# Patient Record
Sex: Female | Born: 1960
Health system: Southern US, Community
[De-identification: ages and names within clinical notes are randomized; demographics above are authoritative.]

## PROBLEM LIST (undated history)

## (undated) DIAGNOSIS — T4145XA Adverse effect of unspecified anesthetic, initial encounter: Secondary | ICD-10-CM

## (undated) DIAGNOSIS — K219 Gastro-esophageal reflux disease without esophagitis: Secondary | ICD-10-CM

## (undated) DIAGNOSIS — K52832 Lymphocytic colitis: Secondary | ICD-10-CM

## (undated) DIAGNOSIS — M1990 Unspecified osteoarthritis, unspecified site: Secondary | ICD-10-CM

## (undated) DIAGNOSIS — K2970 Gastritis, unspecified, without bleeding: Secondary | ICD-10-CM

## (undated) DIAGNOSIS — M549 Dorsalgia, unspecified: Secondary | ICD-10-CM

## (undated) DIAGNOSIS — G43909 Migraine, unspecified, not intractable, without status migrainosus: Secondary | ICD-10-CM

## (undated) DIAGNOSIS — R609 Edema, unspecified: Secondary | ICD-10-CM

## (undated) DIAGNOSIS — I10 Essential (primary) hypertension: Secondary | ICD-10-CM

## (undated) DIAGNOSIS — M2550 Pain in unspecified joint: Secondary | ICD-10-CM

## (undated) DIAGNOSIS — F329 Major depressive disorder, single episode, unspecified: Secondary | ICD-10-CM

## (undated) DIAGNOSIS — F419 Anxiety disorder, unspecified: Secondary | ICD-10-CM

## (undated) DIAGNOSIS — M5430 Sciatica, unspecified side: Secondary | ICD-10-CM

## (undated) DIAGNOSIS — T8859XA Other complications of anesthesia, initial encounter: Secondary | ICD-10-CM

## (undated) DIAGNOSIS — F32A Depression, unspecified: Secondary | ICD-10-CM

## (undated) HISTORY — DX: Essential (primary) hypertension: I10

## (undated) HISTORY — DX: Migraine, unspecified, not intractable, without status migrainosus: G43.909

## (undated) HISTORY — PX: TOTAL ABDOMINAL HYSTERECTOMY: SHX209

## (undated) HISTORY — DX: Lymphocytic colitis: K52.832

## (undated) HISTORY — DX: Dorsalgia, unspecified: M54.9

## (undated) HISTORY — DX: Sciatica, unspecified side: M54.30

## (undated) HISTORY — DX: Gastritis, unspecified, without bleeding: K29.70

## (undated) HISTORY — DX: Depression, unspecified: F32.A

## (undated) HISTORY — DX: Major depressive disorder, single episode, unspecified: F32.9

## (undated) HISTORY — DX: Gastro-esophageal reflux disease without esophagitis: K21.9

## (undated) HISTORY — DX: Unspecified osteoarthritis, unspecified site: M19.90

## (undated) HISTORY — DX: Edema, unspecified: R60.9

## (undated) HISTORY — DX: Pain in unspecified joint: M25.50

## (undated) HISTORY — PX: SKIN CANCER EXCISION: SHX779

## (undated) HISTORY — PX: BACK SURGERY: SHX140

## (undated) HISTORY — DX: Anxiety disorder, unspecified: F41.9

---

## 1999-07-17 ENCOUNTER — Other Ambulatory Visit: Admission: RE | Admit: 1999-07-17 | Discharge: 1999-07-17 | Payer: Self-pay | Admitting: *Deleted

## 1999-11-07 ENCOUNTER — Encounter (INDEPENDENT_AMBULATORY_CARE_PROVIDER_SITE_OTHER): Payer: Self-pay | Admitting: *Deleted

## 1999-11-07 ENCOUNTER — Ambulatory Visit (HOSPITAL_BASED_OUTPATIENT_CLINIC_OR_DEPARTMENT_OTHER): Admission: RE | Admit: 1999-11-07 | Discharge: 1999-11-07 | Payer: Self-pay | Admitting: Plastic Surgery

## 2000-07-20 ENCOUNTER — Encounter: Admission: RE | Admit: 2000-07-20 | Discharge: 2000-07-28 | Payer: Self-pay | Admitting: Family Medicine

## 2001-02-15 ENCOUNTER — Ambulatory Visit (HOSPITAL_BASED_OUTPATIENT_CLINIC_OR_DEPARTMENT_OTHER): Admission: RE | Admit: 2001-02-15 | Discharge: 2001-02-15 | Payer: Self-pay | Admitting: Orthopaedic Surgery

## 2002-01-27 ENCOUNTER — Other Ambulatory Visit: Admission: RE | Admit: 2002-01-27 | Discharge: 2002-01-27 | Payer: Self-pay | Admitting: *Deleted

## 2002-06-19 ENCOUNTER — Encounter: Payer: Self-pay | Admitting: Neurosurgery

## 2002-06-19 ENCOUNTER — Inpatient Hospital Stay (HOSPITAL_COMMUNITY): Admission: RE | Admit: 2002-06-19 | Discharge: 2002-06-20 | Payer: Self-pay | Admitting: Neurosurgery

## 2003-03-20 ENCOUNTER — Other Ambulatory Visit: Admission: RE | Admit: 2003-03-20 | Discharge: 2003-03-20 | Payer: Self-pay | Admitting: *Deleted

## 2003-07-14 HISTORY — PX: OTHER SURGICAL HISTORY: SHX169

## 2004-07-13 HISTORY — PX: KNEE SURGERY: SHX244

## 2005-03-17 ENCOUNTER — Ambulatory Visit: Payer: Self-pay | Admitting: Family Medicine

## 2005-04-16 ENCOUNTER — Ambulatory Visit: Payer: Self-pay | Admitting: Family Medicine

## 2005-07-13 HISTORY — PX: ABDOMINAL HYSTERECTOMY: SHX81

## 2005-07-17 ENCOUNTER — Observation Stay (HOSPITAL_COMMUNITY): Admission: RE | Admit: 2005-07-17 | Discharge: 2005-07-18 | Payer: Self-pay | Admitting: *Deleted

## 2005-07-17 ENCOUNTER — Encounter (INDEPENDENT_AMBULATORY_CARE_PROVIDER_SITE_OTHER): Payer: Self-pay | Admitting: Specialist

## 2005-08-24 ENCOUNTER — Ambulatory Visit: Payer: Self-pay | Admitting: Family Medicine

## 2005-09-01 ENCOUNTER — Ambulatory Visit: Payer: Self-pay | Admitting: Family Medicine

## 2005-09-07 ENCOUNTER — Ambulatory Visit: Payer: Self-pay | Admitting: Family Medicine

## 2005-09-11 ENCOUNTER — Ambulatory Visit: Payer: Self-pay | Admitting: Family Medicine

## 2005-09-23 ENCOUNTER — Ambulatory Visit: Payer: Self-pay | Admitting: Internal Medicine

## 2005-10-19 ENCOUNTER — Ambulatory Visit: Payer: Self-pay | Admitting: Internal Medicine

## 2005-10-21 ENCOUNTER — Encounter (INDEPENDENT_AMBULATORY_CARE_PROVIDER_SITE_OTHER): Payer: Self-pay | Admitting: *Deleted

## 2005-10-21 ENCOUNTER — Ambulatory Visit: Payer: Self-pay | Admitting: Internal Medicine

## 2006-05-19 ENCOUNTER — Encounter: Payer: Self-pay | Admitting: Family Medicine

## 2006-05-19 ENCOUNTER — Ambulatory Visit: Payer: Self-pay | Admitting: Family Medicine

## 2006-05-19 DIAGNOSIS — H9190 Unspecified hearing loss, unspecified ear: Secondary | ICD-10-CM | POA: Insufficient documentation

## 2006-05-19 DIAGNOSIS — M5430 Sciatica, unspecified side: Secondary | ICD-10-CM | POA: Insufficient documentation

## 2006-11-23 ENCOUNTER — Ambulatory Visit: Payer: Self-pay | Admitting: Family Medicine

## 2006-11-23 DIAGNOSIS — J039 Acute tonsillitis, unspecified: Secondary | ICD-10-CM | POA: Insufficient documentation

## 2006-11-23 DIAGNOSIS — I10 Essential (primary) hypertension: Secondary | ICD-10-CM | POA: Insufficient documentation

## 2006-12-21 ENCOUNTER — Ambulatory Visit: Payer: Self-pay | Admitting: Family Medicine

## 2006-12-24 ENCOUNTER — Telehealth: Payer: Self-pay | Admitting: Family Medicine

## 2006-12-31 ENCOUNTER — Ambulatory Visit: Payer: Self-pay | Admitting: Family Medicine

## 2007-02-01 ENCOUNTER — Ambulatory Visit: Payer: Self-pay | Admitting: Family Medicine

## 2007-05-20 ENCOUNTER — Ambulatory Visit: Payer: Self-pay | Admitting: Family Medicine

## 2007-05-20 DIAGNOSIS — J309 Allergic rhinitis, unspecified: Secondary | ICD-10-CM | POA: Insufficient documentation

## 2007-05-27 ENCOUNTER — Telehealth: Payer: Self-pay | Admitting: Family Medicine

## 2007-07-21 ENCOUNTER — Telehealth: Payer: Self-pay | Admitting: Family Medicine

## 2007-11-10 ENCOUNTER — Ambulatory Visit: Payer: Self-pay | Admitting: Family Medicine

## 2007-11-11 LAB — CONVERTED CEMR LAB
AST: 20 units/L (ref 0–37)
Albumin: 4.5 g/dL (ref 3.5–5.2)
Alkaline Phosphatase: 37 units/L — ABNORMAL LOW (ref 39–117)
BUN: 13 mg/dL (ref 6–23)
Creatinine, Ser: 0.64 mg/dL (ref 0.40–1.20)
Glucose, Bld: 87 mg/dL (ref 70–99)
Potassium: 4.1 meq/L (ref 3.5–5.3)
Total Bilirubin: 0.5 mg/dL (ref 0.3–1.2)

## 2007-12-14 ENCOUNTER — Ambulatory Visit (HOSPITAL_COMMUNITY): Admission: RE | Admit: 2007-12-14 | Discharge: 2007-12-14 | Payer: Self-pay | Admitting: Neurosurgery

## 2008-01-06 DIAGNOSIS — Z8719 Personal history of other diseases of the digestive system: Secondary | ICD-10-CM | POA: Insufficient documentation

## 2008-01-06 DIAGNOSIS — Z85828 Personal history of other malignant neoplasm of skin: Secondary | ICD-10-CM | POA: Insufficient documentation

## 2008-01-09 ENCOUNTER — Ambulatory Visit: Payer: Self-pay | Admitting: Internal Medicine

## 2008-01-16 ENCOUNTER — Encounter: Payer: Self-pay | Admitting: Family Medicine

## 2008-04-18 ENCOUNTER — Encounter: Payer: Self-pay | Admitting: Internal Medicine

## 2008-05-26 ENCOUNTER — Telehealth: Payer: Self-pay | Admitting: Family Medicine

## 2008-05-28 ENCOUNTER — Telehealth: Payer: Self-pay | Admitting: Family Medicine

## 2008-05-31 ENCOUNTER — Ambulatory Visit: Payer: Self-pay | Admitting: Family Medicine

## 2008-05-31 DIAGNOSIS — G43909 Migraine, unspecified, not intractable, without status migrainosus: Secondary | ICD-10-CM | POA: Insufficient documentation

## 2008-05-31 DIAGNOSIS — G43109 Migraine with aura, not intractable, without status migrainosus: Secondary | ICD-10-CM

## 2008-06-06 ENCOUNTER — Encounter: Payer: Self-pay | Admitting: Family Medicine

## 2008-06-11 ENCOUNTER — Telehealth: Payer: Self-pay | Admitting: Family Medicine

## 2008-07-04 ENCOUNTER — Ambulatory Visit (HOSPITAL_COMMUNITY): Admission: RE | Admit: 2008-07-04 | Discharge: 2008-07-04 | Payer: Self-pay | Admitting: Orthopedic Surgery

## 2008-09-06 ENCOUNTER — Telehealth: Payer: Self-pay | Admitting: Family Medicine

## 2008-10-16 ENCOUNTER — Encounter: Payer: Self-pay | Admitting: Internal Medicine

## 2008-10-29 ENCOUNTER — Telehealth (INDEPENDENT_AMBULATORY_CARE_PROVIDER_SITE_OTHER): Payer: Self-pay | Admitting: *Deleted

## 2008-11-05 ENCOUNTER — Ambulatory Visit: Payer: Self-pay | Admitting: Family Medicine

## 2008-11-05 DIAGNOSIS — M25569 Pain in unspecified knee: Secondary | ICD-10-CM | POA: Insufficient documentation

## 2008-12-05 ENCOUNTER — Ambulatory Visit: Payer: Self-pay | Admitting: Internal Medicine

## 2008-12-06 ENCOUNTER — Ambulatory Visit (HOSPITAL_COMMUNITY): Admission: RE | Admit: 2008-12-06 | Discharge: 2008-12-06 | Payer: Self-pay | Admitting: Orthopedic Surgery

## 2009-04-24 ENCOUNTER — Encounter: Payer: Self-pay | Admitting: Family Medicine

## 2009-06-11 ENCOUNTER — Encounter: Payer: Self-pay | Admitting: Family Medicine

## 2009-06-13 ENCOUNTER — Encounter: Payer: Self-pay | Admitting: Family Medicine

## 2009-06-25 ENCOUNTER — Ambulatory Visit (HOSPITAL_COMMUNITY): Admission: RE | Admit: 2009-06-25 | Discharge: 2009-06-25 | Payer: Self-pay | Admitting: Orthopedic Surgery

## 2009-07-22 HISTORY — PX: SHOULDER SURGERY: SHX246

## 2009-08-22 ENCOUNTER — Ambulatory Visit (HOSPITAL_BASED_OUTPATIENT_CLINIC_OR_DEPARTMENT_OTHER): Admission: RE | Admit: 2009-08-22 | Discharge: 2009-08-22 | Payer: Self-pay | Admitting: Orthopedic Surgery

## 2009-08-28 ENCOUNTER — Telehealth: Payer: Self-pay | Admitting: Family Medicine

## 2009-09-17 ENCOUNTER — Telehealth: Payer: Self-pay | Admitting: Family Medicine

## 2009-10-10 ENCOUNTER — Telehealth: Payer: Self-pay | Admitting: Family Medicine

## 2009-10-22 ENCOUNTER — Ambulatory Visit: Payer: Self-pay | Admitting: Family Medicine

## 2009-10-22 LAB — CONVERTED CEMR LAB
BUN: 17 mg/dL (ref 6–23)
CO2: 25 meq/L (ref 19–32)
Chloride: 103 meq/L (ref 96–112)
Creatinine, Ser: 0.76 mg/dL (ref 0.40–1.20)
Glucose, Bld: 96 mg/dL (ref 70–99)
Potassium: 4.2 meq/L (ref 3.5–5.3)

## 2009-12-12 ENCOUNTER — Ambulatory Visit: Payer: Self-pay | Admitting: Sports Medicine

## 2009-12-12 DIAGNOSIS — M1710 Unilateral primary osteoarthritis, unspecified knee: Secondary | ICD-10-CM

## 2009-12-12 DIAGNOSIS — IMO0002 Reserved for concepts with insufficient information to code with codable children: Secondary | ICD-10-CM | POA: Insufficient documentation

## 2010-02-03 ENCOUNTER — Ambulatory Visit: Payer: Self-pay | Admitting: Emergency Medicine

## 2010-02-03 DIAGNOSIS — J069 Acute upper respiratory infection, unspecified: Secondary | ICD-10-CM | POA: Insufficient documentation

## 2010-05-15 LAB — HEPATIC FUNCTION PANEL
ALT: 14 U/L (ref 7–35)
AST: 15 U/L (ref 13–35)

## 2010-05-15 LAB — TSH: TSH: 1.7 u[IU]/mL (ref 0.41–5.90)

## 2010-05-15 LAB — LIPID PANEL: Triglycerides: 99 mg/dL (ref 40–160)

## 2010-06-12 ENCOUNTER — Encounter: Payer: Self-pay | Admitting: Family Medicine

## 2010-06-17 ENCOUNTER — Encounter: Payer: Self-pay | Admitting: Family Medicine

## 2010-06-17 LAB — HM MAMMOGRAPHY: HM Mammogram: NORMAL

## 2010-07-30 ENCOUNTER — Encounter: Payer: Self-pay | Admitting: Family Medicine

## 2010-08-11 ENCOUNTER — Encounter: Payer: Self-pay | Admitting: Family Medicine

## 2010-08-12 NOTE — Progress Notes (Signed)
Summary: shoulder surgery  ---- Converted from flag ---- ---- 08/23/2009 11:28 AM, Payton Spark CMA wrote:   ---- 08/23/2009 11:27 AM, Payton Spark CMA wrote: Pt is doing well. Sees surgeon today for FU and will start PT soon.   ---- 08/23/2009 9:15 AM, Payton Spark CMA wrote:   ---- 08/23/2009 8:12 AM, Nani Gasser MD wrote: Call pt: See how she is doing post shoulder surgery ------------------------------

## 2010-08-12 NOTE — Assessment & Plan Note (Signed)
Summary: HTN, knee pain   Vital Signs:  Patient profile:   50 year old female Height:      68 inches Weight:      226 pounds BMI:     34.49 Pulse rate:   112 / minute BP sitting:   130 / 88  (left arm) Cuff size:   large  Vitals Entered By: Kathlene November (October 22, 2009 8:36 AM) CC: followup BP, Hypertension Management   Primary Care Provider:  Nani Gasser,  M.D.  CC:  followup BP and Hypertension Management.  History of Present Illness: Was having mild HA and "swimmy headedness".  Was checking BPs and they were running in the 130s.  STilll on the meloxicam.  Still having alot of left knee pain s/p surgery on the left.  Also having some GERD.  sxs.  Feels doesn't do well without the meloxicam.   Hypertension History:      She denies headache, chest pain, palpitations, dyspnea with exertion, orthopnea, PND, peripheral edema, visual symptoms, neurologic problems, syncope, and side effects from treatment.  She notes no problems with any antihypertensive medication side effects.  Tolerating the lisinoorpil well. No SE or dry cough.  Stopped the metoprolol.  [name]        Positive major cardiovascular risk factors include hypertension.  Negative major cardiovascular risk factors include female age less than 1 years old, negative family history for ischemic heart disease, and non-tobacco-user status.     Current Medications (verified): 1)  Neurontin 600 Mg Tabs (Gabapentin) .... Take 1 Tablet By Mouth Two Times A Day 2)  Lisinopril-Hydrochlorothiazide 20-25 Mg Tabs (Lisinopril-Hydrochlorothiazide) .... Take 1 Tablet By Mouth Once A Day in The Am 3)  Imitrex 20 Mg/act  Soln (Sumatriptan) .[name].. 1 Spray in One Nostril. Can Repeat in 2 Hours If Still Have Ha 4)  Astelin 137 Mcg/spray  Soln (Azelastine Hcl) .[name].. 1-2 Sprays Each Nostril Two Times A Day 5)  Multivitamins   Caps (Multiple Vitamin) .[name].. 1 Tablet Once Daily 6)  Calcium 500/vitamin D 500-125 Mg-Unit  Tabs (Calcium Carbonate-Vitamin  D) .[name].. 1 Tablet Two Times A Day 7)  Glucosamine Chondr 1500 Complx   Caps (Glucosamine-Chondroit-Vit C-Mn) .[name].. 1 Tablet Once Daily 8)  Fish Oil Concentrate 1000 Mg  Caps (Omega-3 Fatty Acids) .[name].. 1 Tablet Two Times A Day 9)  Meloxicam 7.5 Mg Tabs (Meloxicam) .[name].. 1-2 Tab By Mouth Once Daily With Food As Needed For Pain 10)  Melatonin 3 Mg Tabs (Melatonin) .... At Bedtime 11)  Levsin/sl 0.125 Mg Subl (Hyoscyamine Sulfate) .... Dissolve 1 Tablet Under The Tongue As Needed For Crampy Abdominal Pain  Allergies (verified): 1)  ! * Anectine  Comments:  Nurse/Medical Assistant: The patient's medications and allergies were reviewed with the patient and were updated in the Medication and Allergy Lists. Kathlene November (October 22, 2009 8:37 AM)  Physical Exam  General:  Well-developed,well-nourished,in no acute distress; alert,appropriate and cooperative throughout examination Head:  Normocephalic and atraumatic without obvious abnormalities. No apparent alopecia or balding. Lungs:  Normal respiratory effort, chest expands symmetrically. Lungs are clear to auscultation, no crackles or wheezes. Heart:  Normal rate and regular rhythm. S1 and S2 normal without gallop, murmur, click, rub or other extra sounds. No carotid bruits.  Skin:  no rashes.   Cervical Nodes:  No lymphadenopathy noted Psych:  Cognition and judgment appear intact. Alert and cooperative with normal attention span and concentration. No apparent delusions, illusions, hallucinations   Impression & Recommendations:  Problem #  1:  HYPERTENSION, BENIGN ESSENTIAL (ICD-401.1) Encouraged her to work on diet and exercise. Given infor on the DASH diet. She is also thinking about starting weight Watchers again.SHe has done well with this in the past.  F/U in 3 months. Due for BMP since starting the ACEi.   The following medications were removed from the medication list:    Metoprolol Succinate 25 Mg Xr24h-tab (Metoprolol succinate) .[name]... 1  tab by mouth daily Her updated medication list for this problem includes:    Lisinopril-hydrochlorothiazide 20-25 Mg Tabs (Lisinopril-hydrochlorothiazide) .[name]... Take 1 tablet by mouth once a day in the am  Orders: T-Basic Metabolic Panel [phone])  Problem # 2:  KNEE PAIN, LEFT (ICD-719.46)  I am concerned about her GERD being on a daily NSAID. Discussed starting a PPI OTC for about 8 weeks. If not helping then please let me know.Also dicussed seeing Dr. Darrick Penna to help her start to exercise with her knee pain and hopefully reduce her need for NSAIDs.   The following medications were removed from the medication list:    Tramadol Hcl 50 Mg Tabs (Tramadol hcl) .[name]... Take 1 tablet by mouth three ti. mes a day as needed for knee pain    Cyclobenzaprine Hcl 10 Mg Tabs (Cyclobenzaprine hcl) ..... One by mouth at bedtime as needed muscle spasm. Her updated medication list for this problem includes:    Meloxicam 7.5 Mg Tabs (Meloxicam) .[name]... 1-2 tab by mouth once daily with food as needed for pain  Complete Medication List: 1)  Neurontin 600 Mg Tabs (Gabapentin) .... Take 1 tablet by mouth two times a day 2)  Lisinopril-hydrochlorothiazide 20-25 Mg Tabs (Lisinopril-hydrochlorothiazide) .... Take 1 tablet by mouth once a day in the am 3)  Imitrex 20 Mg/act Soln (Sumatriptan) .[name].. 1 spray in one nostril. can repeat in 2 hours if still have ha 4)  Astelin 137 Mcg/spray Soln (Azelastine hcl) .[name].. 1-2 sprays each nostril two times a day 5)  Multivitamins Caps (Multiple vitamin) .[name].. 1 tablet once daily 6)  Calcium 500/vitamin D 500-125 Mg-unit Tabs (Calcium carbonate-vitamin d) .[name].. 1 tablet two times a day 7)  Glucosamine Chondr 1500 Complx Caps (Glucosamine-chondroit-vit c-mn) .[name].. 1 tablet once daily 8)  Fish Oil Concentrate 1000 Mg Caps (Omega-3 fatty acids) .[name].. 1 tablet two times a day 9)  Meloxicam 7.5 Mg Tabs (Meloxicam) .[name].. 1-2 tab by mouth once daily with food as needed for pain 10)   Melatonin 3 Mg Tabs (Melatonin) .... At bedtime 11)  Levsin/sl 0.125 Mg Subl (Hyoscyamine sulfate) .... Dissolve 1 tablet under the tongue as needed for crampy abdominal pain  Hypertension Assessment/Plan:      The patient's hypertensive risk group is category A: No risk factors and no target organ damage.  Today's blood pressure is 130/88.  Her blood pressure goal is < 140/90.  Patient Instructions: 1)  DASH diet (.nih/gov) 2)  Please schedule a follow-up appointment in 3 months.   Prevention & Chronic Care Immunizations   Influenza vaccine: Historical  (04/12/2006)    Tetanus booster: Not documented    Pneumococcal vaccine: Not documented  Other Screening   Pap smear: Not documented    Mammogram: Normal  (06/13/2009)   Mammogram due: 06/2010   Smoking status: never  (05/19/2006)  Lipids   Total Cholesterol: Not documented   LDL: Not documented   LDL Direct: Not documented   HDL: Not documented   Triglycerides: Not documented  Hypertension   Last Blood Pressure: 130 / 88  (  10/22/2009)   Serum creatinine: 0.64  (11/10/2007)   Serum potassium 4.1  (11/10/2007)  Self-Management Support :    Hypertension self-management support: Not documented

## 2010-08-12 NOTE — Progress Notes (Signed)
Summary: BP elevated  Phone Note Call from Patient Call back at Home Phone [phone]   Caller: Patient Call For: Nani Gasser MD Summary of Call: Pt calls and states has been monitoring BP periodically and readings have been up  140/92, 142/88 and this morning was 143/105. Has been having H/A as well. Initial call taken by: Kathlene November,  October 10, 2009 9:54 AM  Follow-up for Phone Call        Lets add lisinopril to her fluids pill. Continue to monitor BP and then f/u in 3 weeks. Rx sent to [facility] pharm.  Follow-up by: Nani Gasser MD,  October 10, 2009 10:59 AM  Additional Follow-up for Phone Call Additional follow up Details #1::        Pt notified Additional Follow-up by: Kathlene November,  October 10, 2009 11:03 AM    New/Updated Medications: LISINOPRIL-HYDROCHLOROTHIAZIDE 20-25 MG TABS (LISINOPRIL-HYDROCHLOROTHIAZIDE) Take 1 tablet by mouth once a day in the AM Prescriptions: LISINOPRIL-HYDROCHLOROTHIAZIDE 20-25 MG TABS (LISINOPRIL-HYDROCHLOROTHIAZIDE) Take 1 tablet by mouth once a day in the AM  #30 x 0   Entered and Authorized by:   Nani Gasser MD   Signed by:   Nani Gasser MD on 10/10/2009   Method used:   Electronically to        [facility]* (retail)       [address]       [address] Shipping/mailing       [place], [place]  [address]       Ph: [phone]       Fax: [phone]   RxID:   [id number]

## 2010-08-12 NOTE — Progress Notes (Signed)
Summary: pulled muscle  Phone Note Call from Patient Call back at Home Phone [phone]   Caller: Patient Call For: Nani Gasser MD Summary of Call: Pt calls and states pullled muscle in back over the weekend doing heat and ibuprofen already and wanted to know if you would call her in some Flexeril Initial call taken by: Kathlene November,  September 17, 2009 9:17 AM  Follow-up for Phone Call        Will call in small quant. if not better please f/u.  Follow-up by: Nani Gasser MD,  September 17, 2009 10:55 AM  Additional Follow-up for Phone Call Additional follow up Details #1::        Pt notified Additional Follow-up by: Kathlene November,  September 17, 2009 11:31 AM    New/Updated Medications: CYCLOBENZAPRINE HCL 10 MG TABS (CYCLOBENZAPRINE HCL) one by mouth at bedtime as needed muscle spasm. Prescriptions: CYCLOBENZAPRINE HCL 10 MG TABS (CYCLOBENZAPRINE HCL) one by mouth at bedtime as needed muscle spasm.  #20 x 0   Entered and Authorized by:   Nani Gasser MD   Signed by:   Nani Gasser MD on 09/17/2009   Method used:   Electronically to        [facility]* (retail)       [address]       [address] Shipping/mailing       [place], [place]  [address]       Ph: [phone]       Fax: [phone]   RxID:   [id number]

## 2010-08-12 NOTE — Assessment & Plan Note (Signed)
Summary: NP,CHRONIC L KNEE,MC   Vital Signs:  Patient profile:   50 year old female Height:      68 inches Weight:      225 pounds BP sitting:   123 / 84  Vitals Entered By: Lillia Pauls CMA (December 12, 2009 10:38 AM)  Primary Provider:  Nani Gasser,  M.D.   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 * Anectine  Physical Exam  General:  Well-developed,well-nourished,in no acute distress; alert,appropriate and cooperative throughout examination Msk:  RT knee exam shows no effusion; stable ligaments; negative Mcmurray's and provocative meniscal tests; non painful patellar compression; patellar and quadriceps tendons unremarkable.  Left marked changes of DJD bony hypertrophy along med joint line some over lat jt line transverse scar x patella  lacks 10 deg of ext flex only to 100 deg  Extremities:  walking gait good    Impression & Recommendations:  Problem # 1:  KNEE PAIN, LEFT (ICD-719.46)  Her updated medication list for this problem includes:    Meloxicam 7.5 Mg Tabs (Meloxicam) .[name]... 1-2 tab by mouth once daily with food as needed for pain    Tramadol Hcl 50 Mg Tabs (Tramadol hcl) .[name]... 1 by mouth q6hrs prn  Orders: Patella / Knee brace (Z6109)  will try adding tramadol for pain and using knee support  Problem # 2:  OSTEOARTHRITIS, KNEE, LEFT (ICD-715.96)  Her updated medication list for this problem includes:    Meloxicam 7.5 Mg Tabs (Meloxicam) .[name]... 1-2 tab by mouth once daily with food as needed for pain    Tramadol Hcl 50 Mg Tabs (Tramadol hcl) .[name]... 1  by mouth q6hrs prn  this is severe  will try adjunctive tx with inserts and w supplements if this helps cont  at some point will require TKR  Complete Medication List: 1)  Neurontin 600 Mg Tabs (Gabapentin) .... Take 1 tablet by mouth two times a day 2)  Lisinopril-hydrochlorothiazide 20-25 Mg Tabs (Lisinopril-hydrochlorothiazide) .... Take 1 tablet by mouth once a day in the am 3)  Imitrex 20 Mg/act Soln (Sumatriptan) .[name].. 1 spray in one nostril. can repeat in 2 hours if still have ha 4)  Astelin 137 Mcg/spray Soln (Azelastine hcl) .[name].. 1-2 sprays each nostril two times a day 5)  Multivitamins Caps (Multiple vitamin) .[name].. 1 tablet once daily 6)  Calcium 500/vitamin D 500-125 Mg-unit Tabs (Calcium carbonate-vitamin d) .[name].. 1 tablet two times a day 7)  Glucosamine Chondr 1500 Complx Caps (Glucosamine-chondroit-vit c-mn) .[name].. 1 tablet once daily 8)  Fish Oil Concentrate 1000 Mg Caps (Omega-3 fatty acids) .[name].. 1 tablet two times a day 9)  Meloxicam 7.5 Mg Tabs (Meloxicam) .[name].. 1-2 tab by mouth once daily with food as needed for pain 10)  Melatonin 3 Mg Tabs (Melatonin) .... At bedtime 11)  Levsin/sl 0.125 Mg Subl (Hyoscyamine sulfate) .... Dissolve 1 tablet under the tongue as needed for crampy abdominal pain 12)  Tramadol Hcl 50 Mg Tabs (Tramadol hcl) .[name].. 1 by mouth q6hrs prn  Patient Instructions: 1)  Piezogenic papules on  heels 2)  try medial wedges 3)  if helpful let us know 4)  then we want to fit a sports insole either with or without the wedge 5)  try semi- recumbent biking 6)  limit your walking to every other day 7)  water aerobics 8)  try the knee support on the days you walk - not needed on bike 9)  let us know how you respond 10)  consider devil's claw 11)  mobic is OK - watch BP and kidneys 12)  tramadol may be an option 13)  let us know how you progress in 2 mos Prescriptions: TRAMADOL HCL 50 MG TABS (TRAMADOL HCL) 1 by mouth q6hrs prn  #120 x 2   Entered and  Authorized by:   Enid Baas MD   Signed by:   Enid Baas MD on 12/12/2009   Method used:   Electronically to        [facility]* (retail)       [address]       [address] Shipping/mailing       [place], [place]  [address]       Ph: [phone]       Fax: [phone]   RxID:   [id number]

## 2010-08-12 NOTE — Assessment & Plan Note (Signed)
Summary: CHEST COLD/POSSIBLE SINUS INFECTION   Vital Signs:  Patient Profile:   50 Years Old Female CC:      Cough, headache, sinus problems x 10 days Height:     68 inches Weight:      218 pounds O2 Sat:      98 % O2 treatment:    Room Air Temp:     97.7 degrees F oral Pulse rate:   85 / minute Pulse rhythm:   regular Resp:     16 per minute BP sitting:   119 / 87  (right arm) Cuff size:   large  Vitals Entered By: Emilio Math (February 03, 2010 11:04 AM)                  Current Allergies (reviewed today): ! 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  Also w/ HA and sinus drainage.  Her family had similar symptoms about 2 weeks ago, but are now better.  Her symptoms are continuing.  She has tried OTC cough meds which didn't help much.  Phenergan w/ codeine did help 2 nights ago.  No F/C/N/V.  Current Meds NEURONTIN 600 MG TABS (GABAPENTIN) Take 1 tablet by mouth two times a day LISINOPRIL-HYDROCHLOROTHIAZIDE 20-25 MG TABS (LISINOPRIL-HYDROCHLOROTHIAZIDE) Take 1 tablet by mouth once a day in the AM IMITREX 20 MG/ACT  SOLN (SUMATRIPTAN) 1 spray in one nostril. Can repeat in 2 hours if still have HA MULTIVITAMINS   CAPS (MULTIPLE VITAMIN) 1 tablet once daily CALCIUM 500/VITAMIN D 500-125 MG-UNIT  TABS (CALCIUM CARBONATE-VITAMIN D) 1 tablet two times a day GLUCOSAMINE CHONDR 1500 COMPLX   CAPS (GLUCOSAMINE-CHONDROIT-VIT C-MN) 1 tablet once daily FISH OIL CONCENTRATE 1000 MG  CAPS (OMEGA-3 FATTY ACIDS) 1 tablet two times a day MELOXICAM 7.5 MG TABS (MELOXICAM) 1-2 tab by mouth once daily with food as needed for pain MELATONIN 3 MG TABS (MELATONIN) at bedtime LEVSIN/SL 0.125 MG SUBL (HYOSCYAMINE SULFATE) Dissolve 1 tablet under the tongue as needed for crampy abdominal pain ZITHROMAX Z-PAK 250 MG TABS (AZITHROMYCIN) use as directed TESSALON 200 MG CAPS  (BENZONATATE) 1 cap by mouth three times a day as needed for cough PROMETHAZINE-CODEINE 6.25-10 MG/5ML SYRP (PROMETHAZINE-CODEINE) 10cc by mouth at bedtime as needed cough  REVIEW OF SYSTEMS Constitutional Symptoms       Complains of chills.     Denies fever, night sweats, weight loss, weight gain, and fatigue.  Eyes       Denies change in vision, eye pain, eye discharge, glasses, contact lenses, and eye surgery. Ear/Nose/Throat/Mouth       Complains of sinus problems.      Denies hearing loss/aids, change in hearing, ear pain, ear discharge, dizziness, frequent runny nose, frequent nose bleeds, sore throat, hoarseness, and tooth pain or bleeding.  Respiratory       Complains of dry cough.      Denies productive cough, wheezing, shortness of breath, asthma, bronchitis, and emphysema/COPD.  Cardiovascular       Denies murmurs, chest pain, and tires easily with exhertion.    Gastrointestinal       Denies stomach pain, nausea/vomiting, diarrhea, constipation, blood in bowel movements, and indigestion. Genitourniary       Denies painful urination, kidney stones, and loss of urinary control. Neurological       Complains of headaches.      Denies paralysis, seizures, and fainting/blackouts.  Musculoskeletal       Denies muscle pain, joint pain, joint stiffness, decreased range of motion, redness, swelling, muscle weakness, and gout.  Skin       Denies bruising, unusual mles/lumps or sores, and hair/skin or nail changes.  Psych       Denies mood changes, temper/anger issues, anxiety/stress, speech problems, depression, and sleep problems.  Past History:  Past Medical History: Reviewed history from 05/31/2008 and no changes required. Microscopic lymphocytic colitis.  Causes diarrhea-Seen by Dr. Lynelle Doctor migraines HTN  Past Surgical History: Reviewed history from 08/23/2009 and no changes required. 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 Teressa Senter  Family History: Reviewed history from 01/06/2008 and no changes required. Breast and tongue CA No FH of Colon Cancer: Family History of Breast Cancer: Mother Family History of Oral Cancer: Mother (Tongue) Family History of Heart Disease: Father  Social History: Reviewed history from 01/06/2008 and no changes required. 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 Married to [place] with 2 teenagers.   Never Smoked Alcohol use-yes-on occasion Drug use-no Regular exercise-yes Physical Exam General appearance: well developed, well nourished, no acute distress Ears: normal, no lesions or deformities Nasal: mucosa pink, nonedematous, no septal deviation, turbinates normal Oral/Pharynx: clear PND, no erythema Neck: neck supple,  trachea midline, no masses Chest/Lungs: no rales, wheezes, or rhonchi bilateral, breath sounds equal without effort Heart: regular rate and  rhythm, no murmur Skin: no obvious rashes or lesions Assessment New Problems: UPPER RESPIRATORY INFECTION (ICD-465.9)   Plan New Medications/Changes: PROMETHAZINE-CODEINE 6.25-10 MG/5ML SYRP (PROMETHAZINE-CODEINE) 10cc by mouth at bedtime as needed cough  #150cc x 0, 02/03/2010, Hoyt Koch MD TESSALON 200 MG CAPS (BENZONATATE) 1 cap by mouth three times a day as needed for cough  #21 x 0, 02/03/2010, Hoyt Koch MD ZITHROMAX Z-PAK 250 MG TABS (AZITHROMYCIN) use as directed  #1 pak x 0, 02/03/2010, Hoyt Koch MD  New Orders: New Patient Level II [id number] Planning Comments:   Hydration Tylenol/Ibuprofen as needed Take Zpak if not getting better in 3 days If not improving or new symptoms, return to clinic for Xray and re-eval   The patient and/or caregiver has been counseled thoroughly with regard to medications prescribed including dosage, schedule, interactions, rationale for use, and possible side effects and they verbalize  understanding.  Diagnoses and expected course of recovery discussed and will return if not improved as expected or if the condition worsens. Patient and/or caregiver verbalized understanding.  Prescriptions: PROMETHAZINE-CODEINE 6.25-10 MG/5ML SYRP (PROMETHAZINE-CODEINE) 10cc by mouth at bedtime as needed cough  #150cc x 0   Entered and Authorized by:   Hoyt Koch MD   Signed by:   Hoyt Koch MD on 02/03/2010   Method used:   Printed then faxed to ...       [facility]* (retail)       [address] [place], [place]  [address]       Ph: [phone]       Fax: [phone]   RxID:   [id number] TESSALON 200 MG CAPS (BENZONATATE) 1 cap by mouth three times a day as needed for cough  #21 x 0   Entered and Authorized by:   Hoyt Koch MD   Signed by:   Hoyt Koch MD on 02/03/2010   Method used:   Printed then faxed to .Marland KitchenMarland Kitchen  [facility]* (retail)       [address] [place], [place]  [address]       Ph: [phone]       Fax: [phone]   RxID:   [id number] ZITHROMAX Z-PAK 250 MG TABS (AZITHROMYCIN) use as directed  #1 pak x 0   Entered and Authorized by:   Hoyt Koch MD   Signed by:   Hoyt Koch MD on 02/03/2010   Method used:   Print then Give to Patient   RxID:   [id number]   Orders Added: 1)  New Patient Level II [64403]

## 2010-08-20 NOTE — Miscellaneous (Signed)
Summary: DEXA  Clinical Lists Changes  Observations: Added new observation of DEXANXTDUE: 08/2015 (08/11/2010 9:04) Added new observation of BONE DENSITY: normal (07/30/2010 9:05)      Preventive Care Screening  Bone Density:    Date:  07/30/2010    Next Due:  08/2015    Results:  normal  Call pt: Bone density is aweseom!!!!! Keep up the exercise, calcium adn vitamin D daily. Repeat in 5 years. January 30, 20129:05 AM [place] MD, [facility]   9:44 AM August 12, 2010 McCrimmon CMA, Duncan Dull), Sue Lush pt notified

## 2010-10-03 ENCOUNTER — Telehealth: Payer: Self-pay | Admitting: Family Medicine

## 2010-10-03 LAB — BASIC METABOLIC PANEL
BUN: 14 mg/dL (ref 6–23)
CO2: 33 mEq/L — ABNORMAL HIGH (ref 19–32)
Calcium: 9.1 mg/dL (ref 8.4–10.5)
Creatinine, Ser: 0.66 mg/dL (ref 0.4–1.2)
Glucose, Bld: 88 mg/dL (ref 70–99)

## 2010-10-03 NOTE — Telephone Encounter (Signed)
Pt feels light headed today, her bp is normal today but it was high yesterday, no OV times available , pls advise pt

## 2010-10-13 NOTE — Telephone Encounter (Signed)
Pt had already been notified to make an appt. Last week. Called and pt had received message and pt states she had been taking claritin since she stopped this the dizziness has stopped. And she no longer has a concern

## 2010-10-21 LAB — COMPREHENSIVE METABOLIC PANEL
AST: 24 U/L (ref 0–37)
CO2: 32 mEq/L (ref 19–32)
Calcium: 9.5 mg/dL (ref 8.4–10.5)
Creatinine, Ser: 0.63 mg/dL (ref 0.4–1.2)
GFR calc Af Amer: >60 mL/min (ref >60)
GFR calc non Af Amer: >60 mL/min (ref >60)
Total Protein: 6.8 g/dL (ref 6.0–8.3)

## 2010-10-21 LAB — CBC
MCHC: 35.3 g/dL (ref 30.0–36.0)
MCV: 88.2 fL (ref 78.0–100.0)
Platelets: 302 10*3/uL (ref 150–400)
RBC: 4.51 MIL/uL (ref 3.87–5.11)
RDW: 12.3 % (ref 11.5–15.5)

## 2010-10-21 LAB — APTT: aPTT: 28 seconds (ref 24–37)

## 2010-11-25 NOTE — Op Note (Signed)
NAMEKENIYA, Kathy Clark             ACCOUNT NO.:  [id number]   MEDICAL RECORD NO.:  [id number]          PATIENT TYPE:  AMB   LOCATION:  SDS                          FACILITY:  [facility]   PHYSICIAN:  Nadara Mustard, MD     DATE OF BIRTH:  1960/10/03   DATE OF PROCEDURE:  12/06/2008  DATE OF DISCHARGE:  12/06/2008                               OPERATIVE REPORT   PREOPERATIVE DIAGNOSIS:  Osteoarthritis, left knee.   POSTOPERATIVE DIAGNOSES:  1. Medial and lateral meniscal tears.  2. Loose bodies x3, greater than 10 mm in diameter.  3. Osteoarthritis of the medial joint line, lateral joint line, and      patellofemoral joint.   PROCEDURE:  1. Partial medial and lateral meniscectomies, left knee.  2. Excision of loose bodies x3, greater than 10 mm in diameter each.  3. Abrasion chondroplasty back to bleeding viable subchondral bone of      the medial femoral condyle, medial tibial plateau, patella and      trochlea.   SURGEON:  Nadara Mustard, MD   ANESTHESIA:  Knee block plus general.   ESTIMATED BLOOD LOSS:  Minimal.   ANTIBIOTICS:  1 gram of Kefzol.   DRAINS:  None.   COMPLICATIONS:  None.   DISPOSITION:  To PACU in stable condition.   INDICATIONS FOR PROCEDURE:  The patient is a 50 year old woman status  post patella fracture with tricompartmental osteoarthritis of her left  knee.  She has mechanical symptoms of catching, locking, giving way, and  has failed conservative care and presents at this time for arthroscopic  intervention.  Risks and benefits were discussed including infection,  neurovascular injury, persistent pain, and need for additional surgery.  The patient states she understands and wished to proceed at this time.   DESCRIPTION OF PROCEDURE:  The patient was brought to OR room 1 after  undergoing a knee block.  She then underwent a general LMA anesthetic.  After adequate level of anesthesia was obtained, the patient's left  lower extremity was prepped  using DuraPrep and draped into a sterile  field.  The scope was inserted through the inferior lateral port and  inferior medial working portal was established.  Visualization showed a  large degenerative tear of the medial meniscus as well as osteochondral  defect of medial femoral condyle and medial tibial plateau.  Using the  biter and shaver, the patient underwent partial medial meniscectomy.  She underwent abrasion chondroplasty back to bleeding viable subchondral  bone of the medial femoral condyle, medial tibial plateau, almost the  entire medial femoral condyle was involved.  Examination of the notch  showed an intact ACL.  Examination of the lateral joint line in the  figure 4 position showed a large loose body in the lateral joint line.  This was removed with a grabber.  She underwent a partial lateral  meniscectomy for degenerative tearing of the lateral meniscus.  The  articular cartilage showed some grade 2 changes in the lateral femoral  condyle but otherwise was in good shape.  Examination of the notch  showed her to have a  loose body within the notch and this was removed  using the grabber and this was greater than 10 mm in diameter.  Examination of patellofemoral joint showed osteophytic bone spurs in the  patellofemoral joint.  The bone spurs from the patella were debrided  with a shaver.  She underwent abrasion chondroplasty of the  patellofemoral joint back to bleeding viable subchondral bone.  She had  a large plica which was excised.  Synovitis was excised.  All loose  bodies were removed.  Survey of all compartments again showed no loose  bodies.  The vapor wand was used for hemostasis.  The instruments were  removed.  The portals were closed using 3-0 nylon.  The wound was  covered with Adaptic orthopedic sponges, ABD dressing, Kerlix, and  Coban.  The patient was then extubated and taken to PACU in stable  condition.  Plan for discharge to home.  Prescription for  Vicodin for  pain.      Nadara Mustard, MD  Electronically Signed     MVD/MEDQ  D:  12/06/2008  T:  12/06/2008  Job:  [id number]

## 2010-11-28 NOTE — Discharge Summary (Signed)
Kathy Clark, CALMA             ACCOUNT NO.:  [id number]   MEDICAL RECORD NO.:  [id number]          PATIENT TYPE:  OBV   LOCATION:  9302                          [facility]ACILITY:  [facility]   PHYSICIAN:  Pershing Cox, M.D.DATE OF BIRTH:  05/20/1961   DATE OF ADMISSION:  07/17/2005  DATE OF DISCHARGE:  07/18/2005                                 DISCHARGE SUMMARY   ADMITTING DIAGNOSIS:  Menorrhagia.   PROCEDURE:  Laparoscopic-assisted vaginal hysterectomy using an open  technique.   INDICATIONS FOR PROCEDURE:  The patient has had intractable vaginal bleeding  unsuccessfully treated by birth control pills or progesterone therapy. She  is here today for hysterectomy, declining other options.   HOSPITAL COURSE:  The patient was taken to the operating room on the day of  admission and laparoscopic-assisted vaginal hysterectomy was performed using  an open technique. There were no intraoperative complications. The uterus  was broad, indicative of possible adenomyosis and indeed pathology report  confirmed the diagnosis of adenomyosis. The patient's estimated  intraoperative blood loss was 150 mL. On the evening of surgery the patient  was alert, conversant and tolerating her pain well. Vital signs were stable.  She was using her inspirometer. On the morning of postoperative day #1  Toradol was begun and the patient began p.o. fluids. She was able to  tolerate a regular diet and was able to begin taking p.o. pain medication. A  decision was made to allow her to be discharged after lunch and she was  stable and allowed to go home. She was given prescriptions for Darvocet for  pain and instructions to call for fever or nausea/vomiting.      Pershing Cox, M.D.  Electronically Signed     MAJ/MEDQ  D:  08/12/2005  T:  08/12/2005  Job:  [id number]

## 2010-11-28 NOTE — Op Note (Signed)
Kathy Clark, Kathy Clark             ACCOUNT NO.:  [id number]   MEDICAL RECORD NO.:  [id number]          PATIENT TYPE:  OBV   LOCATION:  9399                          [facility]ACILITY[facility]  [facility]   PHYSICIAN:  Pershing Cox, M.D.DATE OF BIRTH:  Jun 06, 1961   DATE OF PROCEDURE:  07/17/2005  DATE OF DISCHARGE:                                 OPERATIVE REPORT   PREOPERATIVE DIAGNOSIS:  Menorrhagia.   POSTOPERATIVE DIAGNOSIS:  Menorrhagia.   PROCEDURE:  Laparoscopically-assisted vaginal hysterectomy using open  technique.   SURGEON:  Pershing Cox, M.D.   ASSISTANT:  Lenoard Aden, M.D.   ANESTHESIA:  General endotracheal and local 0.25% Marcaine.   INDICATION FOR PROCEDURE:  Please see the dictated history and physical.  This patient has had intractable vaginal bleeding which was not successfully  treated either with birth control pills or progesterone therapy.  She  elected to proceed with hysterectomy, declining other options such as  hysteroscopic ablation.  She is brought to the hospital today for this  procedure.   OPERATIVE FINDINGS:  The patient's pelvis was totally clear.  The uterus was  broad and very soft, suggestive of adenomyosis.  Both fallopian tubes and  ovaries were totally normal.  There was a normal appendix, which was  photographed during the procedure.  There was no adhesive disease in the  pelvis.   PROCEDURE:  Kathy Clark was brought to the operating room with IV in  place.  She received a gram of Ancef in the holding area.  She was placed  supine on the OR table and general endotracheal anesthesia was administered  without difficulty.  She was prepped with a solution of Betadine, including  the upper abdomen, umbilicus, perineum and vagina.  A Foley catheter was  sterilely inserted in the bladder, a bivalve speculum was inserted in the  vagina, and the cervix was visualized, grasped with a single-tooth  tenaculum, and then using a Hulka tenaculum, the  uterus was grasped for  manipulation during the procedure.   The patient was then draped with sterile linens, exposing the anterior  abdominal wall and allowing [place] later to use the vaginal approach for the  hysterectomy.   Marcaine was instilled beneath the umbilicus and a 2 cm incision was made at  the base of the umbilicus.  This was carried down through the subcutaneous  tissues until the anterior fascia was encountered.  The fascia was lifted  and incised.  The incision was then spread.  The preperitoneal space was  explored.  The peritoneum was identified and opened atraumatically.  Using  an S retractor, the peritoneum was lifted and the surfaces around the  incision site explored.  Vicryl 0 was then used to create a pursestring  around the fascial edges and the laparoscopic trocar was then inserted  underneath the S retractor.  Sutures were drawn around the laparoscopic  trocar and tied firmly.  CO2 gas was then used to begin the insufflation  procedure.  The laparoscope was introduced.  The uterus was then manipulated  with the patient in deep Trendelenburg and a decision was made  to proceed  with the operative approach.   Five millimeter ports were placed in both the right and left lower  extremities.  Marcaine was used to anesthetize the skin and the skin  incision was made.  The skin was transilluminated and visualized with the  laparoscope during the procedure.  Five millimeter trocars were used and  placed without trauma to underlying vessels.  Through these ports we were  able to use both graspers and tripolar cautery as we proceeded with the  operation.  Round ligaments on both sides of the specimen were grasped with  the tripolar, cauterized and cut.  The utero-ovarian ligament, fallopian  tube and deep structures were also serially cauterized and separated from  the uterus.  On the patient's left, the peritoneum was lifted and we could  visualize the entrance spot of  the ureter.  We were able to take down the  broad ligament until we got close to the uterine artery, where we stopped.  Lifting the anterior peritoneum, it was incised over the lower uterine  segment and then by lifting it further, we were able to dissect this from  the upper cervix and upper vagina.  The bladder was completely freed on both  sides of the specimen.  On the patient's right we were able to cauterize the  broad ligament down to the level of the upper cervix, but the uterine artery  was not as easily visualized on this side and we did not go any further.   At this point there was no active bleeding and we proceeded to go below for  the vaginal portion of the procedure.   A medium weighted speculum was inserted into the vagina.  The previously-  placed Hulka tenaculum was removed and the [facility] tenaculum was replaced on  the anterior and posterior surfaces.  A solution of Vasopressin 20 units in  100 mL was infused into the submucosal surfaces of the cervix and the cervix  was then incised with a knife to use a circumferential cut.  Mayo scissors  were used to push the mucosal tissues off the cervix.  I entered the cul-de-  sac of Douglas without any difficulty and on the patient's left, a curved  Heaney clamp was used to clamp the uterosacral ligament, which was then  suture ligated and held for the remainder of the procedure.  The long  weighed speculum was then inserted and the right uterosacral ligament was  clamped, cut, and suture ligated.  Using the long LigaSure, we began a  process of cautery along the sidewalls of the uterus.  At this point it was  possible to take the bladder off of the lower uterine segment, and this was  done with blunt dissection.  The bladder was then held with a narrow Deaver  through the remainder of the case.  Using the LigaSure, the sidewalls of the uterus were clamped serially in small bites and then the cauterized tissue  was incised.  We  were able to free first the left side and then the right,  and the specimen was easily removed.  There was no bleeding noted along any  of the dissection sites.  Lifting the bladder, there was no active bleeding  there either.   A 0 Vicryl was used to place a McCall stitch between the uterosacral  ligaments.  Only one stitch was placed because there was very limited space  before the fatty plane of the rectum was seen.  This  McCall stitch was tied  and then the surfaces of the vagina were carefully inspected.  A lap had  been placed earlier to push the bowel into the upper abdomen.  This was  removed and the vagina was closed side to side using interrupted 0 Vicryl  sutures.   Once the vagina had been completely closed, uterosacral ligaments were tied  to one another and cut.  We then went above to visualize the peritoneal  cavity.   The abdomen was reinflated with CO2 gas.  The patient was placed in steep  Trendelenburg and the pelvis was inspected.  Where appropriate, the tripolar  was used to cauterize small bleeders but there was no active bleeding.  The  pneumoperitoneum was deflated on two occasions just to be certain that there  was no active bleeding.  Nezhat aspirator was used to irrigate the pelvis  and to retract any fluid that was available.  The 5 mm trocars were removed  from both lower quadrants and the perforation sites were inspected, and  there was no bleeding.  The operative trocar was removed and the previously-  placed 0 Vicryl suture was tied over my finger as it was retracted so that  no bowel could be entrapped.  The subcutaneous tissues were closed first  with 0 Vicryl suture and then 4-0 Monocryl for skin  edges.  Dermabond was used to close the two 5 mm ports.  There was a 150 mL  blood loss and 150 mL urine output, 1400 mL of crystalloid were infused  during the procedure.  The patient tolerated the procedure well.  There were  no intraoperative  complications.  She was taken to the recovery room in  excellent condition.      Pershing Cox, M.D.  Electronically Signed     Kathy Clark/MEDQ  D:  07/17/2005  T:  07/17/2005  Job:  [id number]

## 2010-11-28 NOTE — Op Note (Signed)
[place]  Patient:    Kathy Clark, Kathy Clark                    MRN: [id number] Proc. Date: 02/15/01 Adm. Date:  [id number] Attending:  Marcene Corning                           Operative Report  PREOPERATIVE DIAGNOSES: 1. Left knee chondromalacia, patella. 2. Left knee torn medial meniscus.  POSTOPERATIVE DIAGNOSES: 1. Left knee chondromalacia, patella. 2. Left knee torn medial meniscus.  PROCEDURES: 1. Left knee chondroplasty, patellofemoral joint. 2. Left knee partial medial meniscectomy.  ANESTHESIA:  General.  SURGEON:  Lubertha Basque. Jerl Santos, M.D.  ASSISTANT:  Prince Rome, P.A.  INDICATION FOR PROCEDURE:  The patient is a 50 year old woman with a many-months history of intense left knee pain.  This has persisted despite conservative measures, and at this point she is offered an arthroscopy.  The procedure was discussed with the patient, and informed operative consent was obtained after discussion of the possible complications of, reaction to anesthesia, and infection.  DESCRIPTION OF PROCEDURE:  The patient was taken to the operating suite, where a general anesthetic was applied without difficulty.  She was positioned supine and prepped and draped in the normal sterile fashion.  After administration of preop IV antibiotics, an arthroscopy of the left knee was performed through two inferior portals.  The suprapatellar pouch was benign, while the patellofemoral joint exhibited some significant grade 3 change on both surfaces.  A thorough chondroplasty was performed of both surfaces.  I found no exposed bone.  The patella did track in a normal position, which would be consistent with her previous surgeries, which sound like a patellar realignment.  The lateral structures were not particularly tight and it was not felt that an arthroscopic lateral release was in order, so this was not done.  In the medial compartment she did have a  degenerative tear of the posterior horn of the medial meniscus.  This was addressed with a partial medial meniscectomy, taking about 5% of the structure back to a stable rim. She also had a small area of chondromalacia on the far posterior aspect of the femoral condyle, which was addressed with a chondroplasty there as well.  The lateral compartment was completely benign, and the ACL and PCL were intact. The knee was thoroughly irrigated at the end of the case, followed by a placement of Marcaine with epinephrine and morphine plus Depo-Medrol.  Adaptic was placed over the portals, followed by dry gauze and a loose Ace wrap. Estimated blood loss and intraoperative fluids can be obtained from anesthesia records.  DISPOSITION:  The patient was extubated in the operating room and taken to the recovery room in stable condition.  Plans were for her to go home the same day and follow up in the office in less than a week.  I will contact her by phone tonight. DD:  02/15/01 TD:  02/15/01 Job: [id number]

## 2010-11-28 NOTE — H&P (Signed)
NAMEJABREA, KALLSTROM             ACCOUNT NO.:  [id number]   MEDICAL RECORD NO.:  [id number]          PATIENT TYPE:  AMB   LOCATION:  SDC                           FACILITY:  [facility]   PHYSICIAN:  Pershing Cox, M.D.DATE OF BIRTH:  20-Feb-1961   DATE OF ADMISSION:  DATE OF DISCHARGE:                                HISTORY & PHYSICAL   ADMITTING DIAGNOSES:  Persistent menorrhagia.   HISTORY OF PRESENT ILLNESS:  Kathy Clark is a 50 year old gravida 3,  para 2-0-1-2 married white female.  Over the last two years she has had  increasing problems with menorrhagia.  She was unable to tolerate the oral  contraceptives and she went off of them and her husband had a vasectomy.  Over the last year she has kept a chart of her menstrual cycles and many of  these cycles are lasting as much as 10 days with five to six days of very,  very heavy flow.  We have tried oral contraceptives to manage this heavy  bleeding without success.  Kathy Clark was counseled regarding her options which  include endometrial ablation but she is absolutely fed up with the amount of  bleeding that she is doing and wants to proceed with a hysterectomy.  She is  brought to the operating room today for attempted laparoscopic-assisted  vaginal hysterectomy.   PAST MEDICAL HISTORY:   ALLERGIES:  Reaction to ANECTINE.   MEDICATIONS:  1.  Neurontin 300 mg t.i.d.  2.  Imitrex 20 mg p.r.n.   SERIOUS MEDICAL ILLNESSES:  1.  Herniated disk with DJD.  2.  Migraine headaches.   PAST SURGICAL HISTORY:  1.  Microdiskectomy, laminectomy, and foraminectomy under Dr. Earl Gala      care for disease at L5-S1 on December 2003.  2.  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  This was in 2001.   SOCIAL HISTORY:  The patient is an Adult nurse at  [employer].  The patient's mother is 30 years of age, has a history of  breast cancer, tongue cancer, osteoporosis, and asthma.  The patient's  father is 21  years of age.  He has a history of hypertension and kidney  stones.  No ovarian or colon cancer.  She has two healthy brothers.   PHYSICAL EXAMINATION:  VITAL SIGNS:  Blood pressure 122/78, weight 214,  height 5 feet 8 inches.  GENERAL:  The patient is obese with a BMI of 33.  HEENT:  Normocephalic, anicteric.  EOMI.  PERRLA.  NECK:  No carotid bruits.  Thyroid normal to palpation.  LUNGS:  Clear to auscultation and percussion.  CARDIOVASCULAR:  Regular rate and rhythm without murmur.  ABDOMEN:  Soft, nontender.  No surgical scars.  No hepatosplenomegaly.  LYMPH:  No supraclavicular, axillary, or inguinal adenopathy.  SKIN:  No noted skin lesions.  NEURO/PSYCH:  Oriented to person, place, and time.  Appropriate affect.  BREASTS:  Patient has multinodular breasts, but no areas of discrete  concern.  BACK:  No CVA or spinal tenderness.  Scar consistent with previous back  surgery.  PELVIC:  Normal external genitalia.  The vagina is normal in appearance.  Cervix is normal in appearance.  The uterus is anteflexed, mobile, and  nontender.  The adnexa have no lesions.  Urethra and bladder are well  supported.  Anus and perineum are intact.   ASSESSMENT:  Menorrhagia, intractable and intolerable to patient.  The  patient is admitted for laparoscopic-assisted vaginal hysterectomy.      Pershing Cox, M.D.  Electronically Signed     MAJ/MEDQ  D:  06/11/2005  T:  06/11/2005  Job:  [id number]

## 2010-11-28 NOTE — Op Note (Signed)
Kathy Clark, Kathy Clark                       ACCOUNT NO.:  [id number]   MEDICAL RECORD NO.:  [id number]                   PATIENT TYPE:  INP   LOCATION:  2899                                 FACILITY:  [facility]   PHYSICIAN:  Hewitt Shorts, M.D.            DATE OF BIRTH:  1961/05/08   DATE OF PROCEDURE:  06/19/2002  DATE OF DISCHARGE:                                 OPERATIVE REPORT   PREOPERATIVE DIAGNOSIS:  Right L4-5 lumbar disk herniation.   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   PROCEDURE:  Right L4-5 lumbar laminotomy and foraminotomy.   SURGEON:  Hewitt Shorts, M.D.   ASSISTANT:  Payton Doughty, M.D.   ANESTHESIA:  General endotracheal.   INDICATIONS:  The patient is a 50 year old woman who presented with a right  lumbar radiculopathy.  MRI scan revealed what was felt to be a small central  to right L4-5 disk herniation.  There was some compression of the right L5  nerve root within the lateral recess, and a decision was made to proceed  with elective laminotomy and microdiskectomy.   FINDINGS:  Intraoperatively, no disk herniation was found.  There was  moderate facet degeneration and lateral recess encroachment, and therefore  no diskectomy was performed but rather just a laminotomy and foraminotomy.   DESCRIPTION OF PROCEDURE:  The patient was brought to the operating room and  placed under general endotracheal anesthesia.  The patient was turned to a  prone position.  The lumbar region was prepped with Betadine soap and  solution and draped in a sterile fashion.  The midline was infiltrated with  local anesthetic with epinephrine, and an x-ray was taken and the L4-5 level  identified.  A midline incision was made over the L4-5 level and carried  down through the subcutaneous tissue.  Bipolar cautery and electrocautery  were used to maintain hemostasis.  Dissection was  carried down to the lumbar  fascia, which was incised on the right side of the midline and the  paraspinal muscle was dissected from the spinous process and lamina in  subperiosteal fashion.  The L4-5 interlaminar space was identified.  An x-  ray was taken to confirm the localization and then using the [facility] Max drill  and Kerrison punches, a laminotomy was performed.  The microscope was then  draped and brought into the field to provide additional magnification,  illumination, and visualization, and the remainder of the procedure was  performed using microdissection and microsurgical technique.  The ligamentum  flavum was carefully resected, and we identified the thecal sac and right L5  nerve root.  A foraminotomy was performed for the right L5 nerve root using  the [facility] Max drill and Kerrison punches.  We then gently retracted the  thecal sac and nerve root medially.  The disk appeared mildly degenerated  but there was no significant  disk bulging, nor was there any disk  herniation.  It was felt that good decompression of the thecal sac and  particularly the right L5 nerve root within the lateral recess had been  achieved, and therefore the wound was irrigated with bacitracin solution,  checked for hemostasis, which was established, and then a small fat graft  was obtained and placed in the laminotomy defect.  Two cubic centimeters of  fentanyl in 80 mg of Depo-Medrol were infused into the epidural space, and  then the wound was closed in multiple layer, the deep fascia closed with  interrupted, undyed 1 Vicryl sutures, the subcutaneous and subcuticular  layer were closed with interrupted, inverted 2-0 undyed Vicryl sutures, and  the skin edges were reapproximated with Dermabond.  The patient tolerated  the procedure well.  The estimated blood loss was less than 25 cc.  Sponge  and needle count were correct.  Following surgery the patient was to be  turned back to a supine position,  reversed from the anesthetic, and  transferred to the recovery room for further care.                                               Hewitt Shorts, M.D.    RWN/MEDQ  D:  06/19/2002  T:  06/19/2002  Job:  [id number]

## 2010-11-28 NOTE — Op Note (Signed)
[facility]  Patient:    Kathy Clark, Kathy Clark                    MRN: [id number] Proc. Date: 11/07/99 Adm. Date:  [id number] Attending:  Loura Halt Ii                           Operative Report  PREOPERATIVE DIAGNOSIS:  Biopsy-proven basal cell carcinoma, right lower eyelid at the tear trough, 3.0 mm.  POSTOPERATIVE DIAGNOSIS:  Biopsy-proven basal cell carcinoma, right lower eyelid at the tear trough, 3.0 mm.  OPERATION:  Excision of previous biopsy site, right lower eyelid, with primary closure.  SURGEON:  Alfredia Ferguson, M.D.  ANESTHESIA:  Xylocaine 2%, 1:100,000 epinephrine.  INDICATIONS:  This is a 50 year old white female who was seen in my office with a rapidly-enlarging lesion located in her right lower eyelid.  A biopsy of the lesion was carried out, which returned basal cell carcinoma.  The patient had positive  margins on the biopsy site, and now returns to the operating room for a wider excision of the biopsy site.  She understands the risks of ectropion, and also he risk of unsightly scarring.  In spite of that she wishes to proceed with the operation.  DESCRIPTION OF PROCEDURE:  The lower eyelid was first cleansed with alcohol and an elliptical skin marker was placed around the biopsy site.  Local anesthesia was  infiltrated, and the area was prepped and draped in a sterile fashion.  After waiting for approximately five minutes, an elliptical excision of the lesion down to the level of the obicularis oculi muscle was carried out.  Hemostasis was accomplished using electrocautery.  The wound edges were undermined for a distance of 2.0 to 3.0 mm in all directions.  The wound was closed using interrupted #6-0 Prolene suture.  The patient tolerated the procedure well.  She was discharged o home in satisfactory condition. DD:  11/07/99 TD:  11/08/99 Job: [id number]

## 2010-12-10 ENCOUNTER — Other Ambulatory Visit: Payer: Self-pay | Admitting: Family Medicine

## 2010-12-31 ENCOUNTER — Ambulatory Visit (INDEPENDENT_AMBULATORY_CARE_PROVIDER_SITE_OTHER): Payer: 59 | Admitting: Family Medicine

## 2010-12-31 DIAGNOSIS — M25569 Pain in unspecified knee: Secondary | ICD-10-CM

## 2010-12-31 NOTE — Progress Notes (Signed)
  Subjective:    Patient ID: Kathy Clark, female    DOB: 02-19-1961, 50 y.o.   MRN: [id number]  HPI Pt here today with lots of relief from the horseshoe patella brace previously. Gave pt another brace since the elasticity of last one had expanded. Told pt to f/u if had any addition problems.    Review of Systems     Objective:   Physical Exam        Assessment & Plan:

## 2011-01-09 ENCOUNTER — Encounter: Payer: Self-pay | Admitting: Family Medicine

## 2011-01-13 ENCOUNTER — Encounter: Payer: Self-pay | Admitting: Family Medicine

## 2011-01-13 ENCOUNTER — Ambulatory Visit (INDEPENDENT_AMBULATORY_CARE_PROVIDER_SITE_OTHER): Payer: 59 | Admitting: Family Medicine

## 2011-01-13 VITALS — BP 112/78 | HR 76 | Ht 68.0 in | Wt 210.0 lb

## 2011-01-13 DIAGNOSIS — I10 Essential (primary) hypertension: Secondary | ICD-10-CM

## 2011-01-13 NOTE — Progress Notes (Signed)
  Subjective:    Patient ID: Kathy Clark, female    DOB: May 23, 1961, 50 y.o.   MRN: [id number]  HPI  Working out 3 days per week.  Doing weight watchers. Doing well with her medication.  Check BP occ at work.  No chest pain, shortness of breath, palpitations. No extremity swelling.  Knee Pain - Dr. Sherlynn Carbon. They recommended knee replacement . She plans on scheduling this at the end of the year.  Review of Systems     Objective:   Physical Exam  Constitutional: She is oriented to person, place, and time. She appears well-developed and well-nourished.  HENT:  Head: Normocephalic and atraumatic.  Neck: Neck supple.  Cardiovascular: Normal rate, regular rhythm and normal heart sounds.   Pulmonary/Chest: Effort normal and breath sounds normal.  Musculoskeletal: She exhibits no edema.  Neurological: She is alert and oriented to person, place, and time.  Skin: Skin is warm and dry.  Psychiatric: She has a normal mood and affect.          Assessment & Plan:

## 2011-01-13 NOTE — Assessment & Plan Note (Signed)
Her blood pressure looks great today. Followup in 6 months. She is due for a BMP. She did have cholesterol checked last fall and said she will try to bring a copy of that. She had this done at work. I congratulated her on her weight loss. If she keeps this up and I do recommend following her blood pressure as she might need to actually reduce her dosing.

## 2011-02-12 ENCOUNTER — Other Ambulatory Visit: Payer: Self-pay | Admitting: *Deleted

## 2011-02-12 MED ORDER — LISINOPRIL-HYDROCHLOROTHIAZIDE 20-25 MG PO TABS: 1.0000 | ORAL_TABLET | Freq: Every day | ORAL | Status: DC

## 2011-03-04 ENCOUNTER — Ambulatory Visit (INDEPENDENT_AMBULATORY_CARE_PROVIDER_SITE_OTHER): Payer: 59 | Admitting: Family Medicine

## 2011-03-04 ENCOUNTER — Encounter: Payer: Self-pay | Admitting: Family Medicine

## 2011-03-04 VITALS — BP 111/72 | HR 86 | Wt 210.0 lb

## 2011-03-04 DIAGNOSIS — R21 Rash and other nonspecific skin eruption: Secondary | ICD-10-CM

## 2011-03-04 MED ORDER — VALACYCLOVIR HCL 1 G PO TABS: 1000.0000 mg | ORAL_TABLET | Freq: Three times a day (TID) | ORAL | Status: DC

## 2011-03-04 NOTE — Progress Notes (Signed)
  Subjective:    Patient ID: Kathy Clark, female    DOB: 11-29-1960, 50 y.o.   MRN: [id number]  HPI Had been outside about a week ago and thought she had some bug bites under her LT breast. Was using cortisone cream. Started ot burn and be really painful yesterday. Says it is painful to wear a bra or touch the skin.  Has been using Tylenol nad aleve for pain.No SOB.  No fever.    Review of Systems     Objective:   Physical Exam  Under the left brest has 4 small erythematous papules that look like they are dry on the surface. No vesicle. I can see the imprints on the skin from her bra but othewise no erythema.        Assessment & Plan:  Rash - Possible shingles. I let her know that none of the lesion are amenable to culture as non are current vesicular. Will go ahead and treat with antiviral. Call if need to something stronger for pain relief.  Can consider topical lidocaine gel.

## 2011-03-04 NOTE — Patient Instructions (Signed)
Call if need to something stronger for pain relief.  Can consider topical lidocaine gel.

## 2011-05-28 ENCOUNTER — Other Ambulatory Visit (HOSPITAL_COMMUNITY): Payer: Self-pay | Admitting: Neurosurgery

## 2011-05-28 DIAGNOSIS — M545 Low back pain, unspecified: Secondary | ICD-10-CM

## 2011-05-28 DIAGNOSIS — M546 Pain in thoracic spine: Secondary | ICD-10-CM

## 2011-06-09 ENCOUNTER — Other Ambulatory Visit: Payer: Self-pay | Admitting: Internal Medicine

## 2011-06-09 ENCOUNTER — Other Ambulatory Visit: Payer: Self-pay | Admitting: Family Medicine

## 2011-06-12 ENCOUNTER — Ambulatory Visit (HOSPITAL_COMMUNITY)
Admission: RE | Admit: 2011-06-12 | Discharge: 2011-06-12 | Disposition: A | Discharge status: Home / Self Care | Payer: 59 | Source: Ambulatory Visit | Attending: Neurosurgery | Admitting: Neurosurgery

## 2011-06-12 DIAGNOSIS — M545 Low back pain, unspecified: Secondary | ICD-10-CM

## 2011-06-12 DIAGNOSIS — M25559 Pain in unspecified hip: Secondary | ICD-10-CM | POA: Insufficient documentation

## 2011-06-12 DIAGNOSIS — M546 Pain in thoracic spine: Secondary | ICD-10-CM

## 2011-06-12 DIAGNOSIS — M79609 Pain in unspecified limb: Secondary | ICD-10-CM | POA: Insufficient documentation

## 2011-06-12 DIAGNOSIS — M5126 Other intervertebral disc displacement, lumbar region: Secondary | ICD-10-CM | POA: Insufficient documentation

## 2011-06-12 MED ORDER — GADOBENATE DIMEGLUMINE 529 MG/ML IV SOLN
20.0000 mL | Freq: Once | INTRAVENOUS | Status: AC
  Administered 2011-06-12: 20 mL via INTRAVENOUS

## 2011-07-06 ENCOUNTER — Encounter: Payer: Self-pay | Admitting: *Deleted

## 2011-07-11 LAB — COMPLETE METABOLIC PANEL WITH GFR
ALT: 20 U/L (ref 0–35)
AST: 16 U/L (ref 0–37)
Calcium: 8.6 mg/dL (ref 8.4–10.5)
Chloride: 105 mEq/L (ref 96–112)
Creat: 0.6 mg/dL (ref 0.50–1.10)
Total Bilirubin: 0.5 mg/dL (ref 0.3–1.2)

## 2011-07-12 ENCOUNTER — Other Ambulatory Visit: Payer: Self-pay | Admitting: Orthopedic Surgery

## 2011-07-12 NOTE — H&P (Signed)
Kathy Clark  DOB: [birth year] Married / Female  Date of Admission:  07/27/2011  Chief Complaint:  Left Knee Pain  History of Present Illness The patient is a 50 year old female who comes in today for a preoperative History and Physical. The patient is scheduled for a left total knee arthroplasty to be performed by Dr. Gus Rankin. Aluisio, MD at [facility] on 07/27/2011. They have been treated conservatively in the past for the above stated problem and despite conservative measures, they continue to have progressive pain and severe functional limitations and dysfunction. It is felt that they would benefit from undergoing total joint replacement. Risks and benefits of the procedure have been discussed with the patient and they elect to proceed with surgery. There are no active contraindications to surgery such as ongoing infection or rapidly progressive neurological disease.  Problem List/Past Medical Impaired Vision. wears glasses Shingles Degenerative Disc Disease Menopause Chronic Pain High blood pressure Irritable bowel syndrome Migraine Headache Osteoarthritis Peripheral Neuropathy Skin Cancer  Allergies Anectine *NEUROMUSCULAR AGENTS*. V-Tach  Intolerance Dilaudid *ANALGESICS - OPIOID*. Nausea, Vomiting.  Family History Cancer. mother and grandfather fathers side Cerebrovascular Accident. grandmother fathers side Drug / Alcohol Addiction. grandfather mothers side Heart Disease. grandmother fathers side Hypertension. mother and father Liver Disease, Chronic. grandfather mothers side Osteoarthritis. mother, brother, grandmother mothers side, grandfather mothers side and child Father. Living, Hypertension. Age 60, Hyperlipidemia Mother. Living, Breast Cancer. age 7, Tongue Cancer  Social History Alcohol use. current drinker; drinks wine and hard liquor; only occasionally per week Children. 2 Current work status. working full  time Drug/Alcohol Rehab (Currently). no Drug/Alcohol Rehab (Previously). no Exercise. Exercises daily; does running / walking and gym / weights Illicit drug use. no Living situation. live with spouse Marital status. married Number of flights of stairs before winded. 2-3 Pain Contract. no Tobacco / smoke exposure. no Tobacco use. never smoker Current occupation. RN Post-Surgical Plans. Plan is for Home Pregnant. no  Medication History Vivelle-Dot (0.05MG /24HR Patch Biweekly, Transdermal) Active. Gabapentin (300MG  Capsule, Oral two times daily) Active. Lisinopril-Hydrochlorothiazide (20-25MG  Tablet, Oral daily) Active. Multivitamin ( Oral daily) Active. Calcium + D ( Oral) Specific dose unknown - Active. Omega 3 (1000MG  Capsule, Oral two times daily) Active. Levsin (0.125MG  Tablet, Oral as needed) Active. Imitrex (20MG /ACT Solution, Nasal as needed) Active.  Past Surgical History Arthroscopy of Knee. left, times 3 Arthroscopy of Shoulder. Date: 2011. right Hysterectomy. Date: 2009. partial (non-cancerous) Spinal Surgery Left Knee Patella Surgery. Date: 32.  Review of Systems General:Not Present- Chills, Fever, Night Sweats, Appetite Loss, Fatigue, Feeling sick, Weight Gain and Weight Loss. Skin:Not Present- Itching, Rash, Skin Color Changes, Ulcer, Psoriasis and Change in Hair or Nails. HEENT:Not Present- Sensitivity to light, Hearing problems, Nose Bleed and Ringing in the Ears. Neck:Not Present- Swollen Glands and Neck Mass. Respiratory:Not Present- Snoring, Chronic Cough, Bloody sputum and Dyspnea. Cardiovascular:Not Present- Shortness of Breath, Chest Pain, Swelling of Extremities, Leg Cramps and Palpitations. Gastrointestinal:Not Present- Bloody Stool, Heartburn, Abdominal Pain, Vomiting, Nausea and Incontinence of Stool. Female Genitourinary:Not Present- Blood in Urine, Menstrual Irregularities, Frequency, Incontinence and  Nocturia. Musculoskeletal:Present- Muscle Pain, Joint Stiffness, Joint Swelling, Joint Pain and Back Pain. Not Present- Muscle Weakness. Neurological:Not Present- Tingling, Numbness, Burning, Tremor, Headaches and Dizziness. Psychiatric:Not Present- Anxiety, Depression and Memory Loss. Endocrine:Not Present- Cold Intolerance, Heat Intolerance, Excessive hunger and Excessive Thirst. Hematology:Not Present- Abnormal Bleeding, Anemia, Blood Clots and Easy Bruising.  Vitals Weight: 210 lb  Pulse: 84 (Regular) Resp.: 14 (Unlabored) BP: 118/86 (Sitting, Left  Arm, Standard)  Physical Exam The physical exam findings are as follows:  General Mental Status - Alert, cooperative and good historian. General Appearance- pleasant. Not in acute distress. Orientation- Oriented X3. Build & Nutrition- Well nourished and Well developed.  Head and Neck Head- normocephalic, atraumatic . Neck Global Assessment- supple. no bruit auscultated on the right and no bruit auscultated on the left. Pupil- Bilateral- Regular and Round. Note: Wears glasses Motion- Bilateral- EOMI.  Chest and Lung Exam Auscultation: Breath sounds:- clear at anterior chest wall and - clear at posterior chest wall. Adventitious sounds:- No Adventitious sounds.  Cardiovascular Auscultation:Rhythm- Regular rate and rhythm. Heart Sounds- S1 WNL and S2 WNL. Murmurs & Other Heart Sounds:Auscultation of the heart reveals - No Murmurs.  Abdomen Palpation/Percussion:Tenderness- Abdomen is non-tender to palpation. Rigidity (guarding)- Abdomen is soft. Auscultation:Auscultation of the abdomen reveals - Bowel sounds normal.   Female Genitourinary Not done, not pertinent to present illness  Musculoskeletal Left knee weel healed transvers scar. ROM 5 -130 Marked crepitus on exam. More tender medial than lateral. No instability.  RADIOGRAPHS: AP and Lateral x-rays shows advanced endstage  arthritis left knee.  Assessment & Plan Osteoarthrosis Left Knee  Patient is for a Left Total Knee Replacement by Dr. Lequita Halt.  Plan is to go home after surgery..  Patient may need hospital bed for home.  Avel Peace, PA-C  PCP - Dr. Linford Arnold

## 2011-07-15 ENCOUNTER — Encounter: Payer: Self-pay | Admitting: Internal Medicine

## 2011-07-15 ENCOUNTER — Encounter (HOSPITAL_COMMUNITY): Payer: Self-pay | Admitting: Pharmacy Technician

## 2011-07-15 ENCOUNTER — Ambulatory Visit (INDEPENDENT_AMBULATORY_CARE_PROVIDER_SITE_OTHER): Payer: 59 | Admitting: Internal Medicine

## 2011-07-15 VITALS — BP 110/60 | HR 80 | Ht 68.5 in | Wt 216.0 lb

## 2011-07-15 DIAGNOSIS — K5289 Other specified noninfective gastroenteritis and colitis: Secondary | ICD-10-CM

## 2011-07-15 MED ORDER — HYOSCYAMINE SULFATE 0.125 MG SL SUBL: 0.1250 mg | SUBLINGUAL_TABLET | SUBLINGUAL | Status: DC | PRN

## 2011-07-15 NOTE — Progress Notes (Signed)
Kathy Clark 15-Jun-1961 MRN [id number]   History of Present Illness:  This is a 51 year old white female with diagnosis of lymphocytic colitis made on a colonoscopy in April 2007. The biopsies showed an  increase in intraepithelial lymphocytes. She was initially on Entocort and subsequently on Levbid 0.375 mg twice a day. She currently  denies having any diarrhea. She has discontinued her medications and takes only Levsin sublingually when necessary. She is scheduled for a total knee replacement 2 weeks from now. She needs a refill on her Levsin.    Past Medical History  Diagnosis Date  . Lymphocytic colitis     microscopic  . Migraines   . Essential hypertension, benign   . Osteoarthritis    Past Surgical History  Procedure Date  . Microdiscetomy 2005  . Abdominal hysterectomy 07-2005  . Knee surgery 2006    left; multiple  . Skin cancer excision     eyelid  . Back surgery   . Shoulder surgery 07-22-09    right    reports that she has never smoked. She has never used smokeless tobacco. She reports that she drinks alcohol. She reports that she does not use illicit drugs. family history includes Breast cancer in her mother and unspecified family member; Heart disease in her father; and Tongue cancer in her mother.  There is no history of Colon cancer. Allergies  Allergen Reactions  . Succinylcholine Anaphylaxis  . Dilaudid (Hydromorphone Hcl) Nausea And Vomiting        Review of Systems: Denies abdominal pain reflux diarrhea rectal bleeding  The remainder of the 10 point ROS is negative except as outlined in H&P   Physical Exam: General appearance  Well developed, in no distress.   Assessment and Plan:  Problem #1 History of lymphocytic colitis likely related to the use of Aleve several years ago. She no longer has any symptoms. She has discontinued all her medications. We will refill her Levsin. Her recall colonoscopy is due in April 2017. We will see her when  necessary.   07/15/2011 Lina Sar

## 2011-07-15 NOTE — Patient Instructions (Signed)
We have sent the following medications to your pharmacy for you to pick up at your convenience: Levsin SL 0.125. Dissolve 1 tablet under the tongue every 4 hours as needed. CC: Dr Nani Gasser

## 2011-07-17 ENCOUNTER — Ambulatory Visit (INDEPENDENT_AMBULATORY_CARE_PROVIDER_SITE_OTHER): Payer: 59 | Admitting: Family Medicine

## 2011-07-17 ENCOUNTER — Encounter: Payer: Self-pay | Admitting: Family Medicine

## 2011-07-17 VITALS — BP 129/86 | HR 75 | Wt 217.0 lb

## 2011-07-17 DIAGNOSIS — H9319 Tinnitus, unspecified ear: Secondary | ICD-10-CM

## 2011-07-17 DIAGNOSIS — I10 Essential (primary) hypertension: Secondary | ICD-10-CM

## 2011-07-17 NOTE — Progress Notes (Signed)
  Subjective:    Patient ID: Kathy Clark, female    DOB: [birth year], 50 y.o.   MRN: [id number]  Hypertension This is a chronic problem. The current episode started more than 1 year ago. The problem is controlled. Associated symptoms include headaches. (Whoosgin in her right ear.  ) There are no associated agents to hypertension. Risk factors for coronary artery disease include no known risk factors. Past treatments include ACE inhibitors and diuretics. There are no compliance problems.   Felt light headed over the weekned.  She also complains of a whooshing sound in her right ear that has been intermittent over the last couple of weeks. No pain, fever or drainage. No hearing loss. She said the last time her blood pressure was around 110 at the doctor's office. She has been taking at home and has been running in the low 120s. No chest pain or short of breath.    Review of Systems  Neurological: Positive for headaches.       Objective:   Physical Exam  Constitutional: She is oriented to person, place, and time. She appears well-developed and well-nourished.  HENT:  Head: Normocephalic and atraumatic.  Right Ear: External ear normal.  Left Ear: External ear normal.  Eyes: Conjunctivae are normal. Pupils are equal, round, and reactive to light.  Neck: Neck supple. No thyromegaly present.  Cardiovascular: Normal rate, regular rhythm and normal heart sounds.        No carotid or abdominal bruits.  Pulmonary/Chest: Effort normal and breath sounds normal.  Abdominal: Soft. Bowel sounds are normal. She exhibits no distension and no mass. There is no tenderness. There is no rebound and no guarding.  Lymphadenopathy:    She has no cervical adenopathy.  Neurological: She is alert and oriented to person, place, and time.  Skin: Skin is warm and dry.  Psychiatric: She has a normal mood and affect. Her behavior is normal.          Assessment & Plan:  Hypertension-well controlled. She is  perfectly a call. If she feels her blood pressures dropping low she can always break the tablet in half. 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 She is mostly beginning in the low 120s. She does but the tablet in half she can see if this improves her headaches. I also encouraged her make sure she stay well hydrated she is on diuretic. She did have a CMP performed recently and her BUN and creatinine were normal. It did not indicate any dehydration.  Whooshing sound in her ear-I tried to give her reassurance. Her ear exam is normal and her blood pressures not too high or too low. If it continues or she notices any actual hearing loss simply for now we'll refer her to ENT for further evaluation. I explained that most of the time this is a benign condition where he never really find an explanation or cause.  She does have a knee replacement for her left knee scheduled in the next couple of weeks. I wish her left hip she does well. She has her preop on Monday. We will do a chest x-ray, urine, EKG.

## 2011-07-20 ENCOUNTER — Encounter (HOSPITAL_COMMUNITY)
Admission: RE | Admit: 2011-07-20 | Discharge: 2011-07-20 | Disposition: A | Discharge status: Home / Self Care | Payer: 59 | Source: Ambulatory Visit | Attending: Orthopedic Surgery | Admitting: Orthopedic Surgery

## 2011-07-20 ENCOUNTER — Encounter (HOSPITAL_COMMUNITY): Payer: Self-pay

## 2011-07-20 ENCOUNTER — Ambulatory Visit (HOSPITAL_COMMUNITY)
Admission: RE | Admit: 2011-07-20 | Discharge: 2011-07-20 | Disposition: A | Discharge status: Home / Self Care | Payer: 59 | Source: Ambulatory Visit | Attending: Orthopedic Surgery | Admitting: Orthopedic Surgery

## 2011-07-20 ENCOUNTER — Other Ambulatory Visit: Payer: Self-pay

## 2011-07-20 DIAGNOSIS — Z01818 Encounter for other preprocedural examination: Secondary | ICD-10-CM | POA: Insufficient documentation

## 2011-07-20 DIAGNOSIS — Z01812 Encounter for preprocedural laboratory examination: Secondary | ICD-10-CM | POA: Insufficient documentation

## 2011-07-20 DIAGNOSIS — Z01810 Encounter for preprocedural cardiovascular examination: Secondary | ICD-10-CM | POA: Insufficient documentation

## 2011-07-20 HISTORY — DX: Other complications of anesthesia, initial encounter: T88.59XA

## 2011-07-20 HISTORY — DX: Adverse effect of unspecified anesthetic, initial encounter: T41.45XA

## 2011-07-20 LAB — CBC
Hemoglobin: 14.4 g/dL (ref 12.0–15.0)
MCH: 30.1 pg (ref 26.0–34.0)
RBC: 4.79 MIL/uL (ref 3.87–5.11)

## 2011-07-20 LAB — URINALYSIS, ROUTINE W REFLEX MICROSCOPIC
Bilirubin Urine: NEGATIVE
Glucose, UA: NEGATIVE mg/dL
Hgb urine dipstick: NEGATIVE
Ketones, ur: NEGATIVE mg/dL
Protein, ur: NEGATIVE mg/dL

## 2011-07-20 LAB — COMPREHENSIVE METABOLIC PANEL
ALT: 24 U/L (ref 0–35)
Alkaline Phosphatase: 54 U/L (ref 39–117)
CO2: 29 mEq/L (ref 19–32)
Calcium: 9.7 mg/dL (ref 8.4–10.5)
GFR calc Af Amer: >90 mL/min (ref >90)
GFR calc non Af Amer: >90 mL/min (ref >90)
Glucose, Bld: 81 mg/dL (ref 70–99)
Potassium: 3.6 mEq/L (ref 3.5–5.1)
Sodium: 139 mEq/L (ref 135–145)
Total Bilirubin: 0.4 mg/dL (ref 0.3–1.2)

## 2011-07-20 NOTE — Patient Instructions (Signed)
20 RENUKA FARFAN  07/20/2011   Your procedure is scheduled on: Mon. 07/27/2011  Report to [facility] at 0515 AM.  Call this number if you have problems the morning of surgery: [phone]   Remember:   Do not eat food:After Midnight.  May have clear liquids:until Midnight .  Clear liquids include soda, tea, black coffee, apple or grape juice, broth.  Take these medicines the morning of surgery with A SIP OF WATER: Neurontin   Do not wear jewelry, make-up or nail polish.  Do not wear lotions, powders, or perfumes.   Do not shave 48 hours prior to surgery.(women only-shaving legs)  Do not bring valuables to the hospital.  Contacts, dentures or bridgework may not be worn into surgery.  Leave suitcase in the car. After surgery it may be brought to your room.  For patients admitted to the hospital, checkout time is 11:00 AM the day of discharge.   Patients discharged the day of surgery will not be allowed to drive home.  Name and phone number of your driver:   Special Instructions: CHG Shower Use Special Wash: 1/2 bottle night before surgery and 1/2 bottle morning of surgery.   Please read over the following fact sheets that you were given: MRSA Information

## 2011-07-26 MED ORDER — BUPIVACAINE 0.25 % ON-Q PUMP SINGLE CATH 300ML
300.0000 mL | INJECTION | Status: DC
  Administered 2011-07-27: 300 mL
  Filled 2011-07-26: qty 300

## 2011-07-27 ENCOUNTER — Encounter (HOSPITAL_COMMUNITY): Payer: Self-pay | Admitting: *Deleted

## 2011-07-27 ENCOUNTER — Inpatient Hospital Stay (HOSPITAL_COMMUNITY)
Admission: RE | Admit: 2011-07-27 | Discharge: 2011-07-30 | DRG: 470 | Disposition: A | Discharge status: Home Health | Payer: 59 | Source: Ambulatory Visit | Attending: Orthopedic Surgery | Admitting: Orthopedic Surgery

## 2011-07-27 ENCOUNTER — Encounter (HOSPITAL_COMMUNITY)
Admission: RE | Disposition: A | Discharge status: Home Health | Payer: Self-pay | Source: Ambulatory Visit | Attending: Orthopedic Surgery

## 2011-07-27 ENCOUNTER — Encounter (HOSPITAL_COMMUNITY): Payer: Self-pay | Admitting: Anesthesiology

## 2011-07-27 ENCOUNTER — Inpatient Hospital Stay (HOSPITAL_COMMUNITY): Payer: 59 | Admitting: Anesthesiology

## 2011-07-27 DIAGNOSIS — E871 Hypo-osmolality and hyponatremia: Secondary | ICD-10-CM | POA: Diagnosis not present

## 2011-07-27 DIAGNOSIS — D62 Acute posthemorrhagic anemia: Secondary | ICD-10-CM | POA: Diagnosis not present

## 2011-07-27 DIAGNOSIS — IMO0002 Reserved for concepts with insufficient information to code with codable children: Secondary | ICD-10-CM | POA: Diagnosis present

## 2011-07-27 DIAGNOSIS — I10 Essential (primary) hypertension: Secondary | ICD-10-CM | POA: Diagnosis present

## 2011-07-27 DIAGNOSIS — M1710 Unilateral primary osteoarthritis, unspecified knee: Principal | ICD-10-CM | POA: Diagnosis present

## 2011-07-27 DIAGNOSIS — G609 Hereditary and idiopathic neuropathy, unspecified: Secondary | ICD-10-CM | POA: Diagnosis present

## 2011-07-27 DIAGNOSIS — Z96659 Presence of unspecified artificial knee joint: Secondary | ICD-10-CM

## 2011-07-27 HISTORY — PX: TOTAL KNEE ARTHROPLASTY: SHX125

## 2011-07-27 LAB — ABO/RH: ABO/RH(D): A POS

## 2011-07-27 LAB — TYPE AND SCREEN: Antibody Screen: NEGATIVE

## 2011-07-27 SURGERY — ARTHROPLASTY, KNEE, TOTAL
Anesthesia: General | Site: Knee | Laterality: Left | Wound class: Clean

## 2011-07-27 MED ORDER — GLYCOPYRROLATE 0.2 MG/ML IJ SOLN
INTRAMUSCULAR | Status: DC | PRN
  Administered 2011-07-27: .6 mg via INTRAVENOUS

## 2011-07-27 MED ORDER — ACETAMINOPHEN 325 MG PO TABS
650.0000 mg | ORAL_TABLET | Freq: Four times a day (QID) | ORAL | Status: DC | PRN
  Administered 2011-07-29 (×2): 650 mg via ORAL
  Filled 2011-07-27 (×2): qty 2

## 2011-07-27 MED ORDER — FENTANYL CITRATE 0.05 MG/ML IJ SOLN
25.0000 ug | INTRAMUSCULAR | Status: DC | PRN
  Administered 2011-07-27 (×3): 50 ug via INTRAVENOUS

## 2011-07-27 MED ORDER — TEMAZEPAM 15 MG PO CAPS: 15.0000 mg | ORAL_CAPSULE | Freq: Every evening | ORAL | Status: DC | PRN

## 2011-07-27 MED ORDER — KETAMINE HCL 10 MG/ML IJ SOLN
INTRAMUSCULAR | Status: DC | PRN
  Administered 2011-07-27: 10 mg via INTRAVENOUS
  Administered 2011-07-27: 30 mg via INTRAVENOUS
  Administered 2011-07-27: 10 mg via INTRAVENOUS

## 2011-07-27 MED ORDER — PROMETHAZINE HCL 25 MG/ML IJ SOLN: 6.2500 mg | INTRAMUSCULAR | Status: DC | PRN

## 2011-07-27 MED ORDER — ACETAMINOPHEN 650 MG RE SUPP: 650.0000 mg | Freq: Four times a day (QID) | RECTAL | Status: DC | PRN

## 2011-07-27 MED ORDER — ROCURONIUM BROMIDE 100 MG/10ML IV SOLN
INTRAVENOUS | Status: DC | PRN
  Administered 2011-07-27: 50 mg via INTRAVENOUS

## 2011-07-27 MED ORDER — ZOLPIDEM TARTRATE 10 MG PO TABS
10.0000 mg | ORAL_TABLET | Freq: Every evening | ORAL | Status: DC | PRN
  Administered 2011-07-28: 10 mg via ORAL
  Filled 2011-07-27: qty 1

## 2011-07-27 MED ORDER — SUFENTANIL CITRATE 50 MCG/ML IV SOLN
INTRAVENOUS | Status: DC | PRN
  Administered 2011-07-27: 20 ug via INTRAVENOUS
  Administered 2011-07-27: 10 ug via INTRAVENOUS
  Administered 2011-07-27: 20 ug via INTRAVENOUS

## 2011-07-27 MED ORDER — MIDAZOLAM HCL 5 MG/5ML IJ SOLN
INTRAMUSCULAR | Status: DC | PRN
  Administered 2011-07-27: 2 mg via INTRAVENOUS

## 2011-07-27 MED ORDER — BUPIVACAINE ON-Q PAIN PUMP (FOR ORDER SET NO CHG)
INJECTION | Status: DC
  Filled 2011-07-27: qty 1

## 2011-07-27 MED ORDER — FLEET ENEMA 7-19 GM/118ML RE ENEM: 1.0000 | ENEMA | Freq: Once | RECTAL | Status: AC | PRN

## 2011-07-27 MED ORDER — METOCLOPRAMIDE HCL 5 MG/ML IJ SOLN
5.0000 mg | Freq: Three times a day (TID) | INTRAMUSCULAR | Status: DC | PRN
  Administered 2011-07-27: 10 mg via INTRAVENOUS
  Filled 2011-07-27 (×2): qty 2

## 2011-07-27 MED ORDER — DIPHENHYDRAMINE HCL 12.5 MG/5ML PO ELIX: 12.5000 mg | ORAL_SOLUTION | ORAL | Status: DC | PRN

## 2011-07-27 MED ORDER — KCL IN DEXTROSE-NACL 20-5-0.9 MEQ/L-%-% IV SOLN
INTRAVENOUS | Status: AC
  Filled 2011-07-27: qty 1000

## 2011-07-27 MED ORDER — ACETAMINOPHEN 10 MG/ML IV SOLN
INTRAVENOUS | Status: DC | PRN
  Administered 2011-07-27: 1000 mg via INTRAVENOUS

## 2011-07-27 MED ORDER — DIPHENHYDRAMINE HCL 50 MG/ML IJ SOLN: 12.5000 mg | Freq: Four times a day (QID) | INTRAMUSCULAR | Status: DC | PRN

## 2011-07-27 MED ORDER — PHENOL 1.4 % MT LIQD: 1.0000 | OROMUCOSAL | Status: DC | PRN

## 2011-07-27 MED ORDER — LACTATED RINGERS IV SOLN: INTRAVENOUS | Status: DC

## 2011-07-27 MED ORDER — POLYETHYLENE GLYCOL 3350 17 G PO PACK
17.0000 g | PACK | Freq: Every day | ORAL | Status: DC | PRN
  Filled 2011-07-27: qty 1

## 2011-07-27 MED ORDER — ONDANSETRON HCL 4 MG/2ML IJ SOLN
INTRAMUSCULAR | Status: DC | PRN
  Administered 2011-07-27: 4 mg via INTRAVENOUS

## 2011-07-27 MED ORDER — SODIUM CHLORIDE 0.9 % IR SOLN
Status: DC | PRN
  Administered 2011-07-27: 1000 mL

## 2011-07-27 MED ORDER — NALOXONE HCL 0.4 MG/ML IJ SOLN: 0.4000 mg | INTRAMUSCULAR | Status: DC | PRN

## 2011-07-27 MED ORDER — FENTANYL CITRATE 0.05 MG/ML IJ SOLN
INTRAMUSCULAR | Status: AC
  Filled 2011-07-27: qty 2

## 2011-07-27 MED ORDER — MORPHINE SULFATE (PF) 1 MG/ML IV SOLN
INTRAVENOUS | Status: DC
  Administered 2011-07-27: 6 mg via INTRAVENOUS
  Administered 2011-07-27: 10:00:00 via INTRAVENOUS
  Administered 2011-07-27: 5 mg via INTRAVENOUS
  Administered 2011-07-27: 12 mg via INTRAVENOUS
  Administered 2011-07-27: 5 mg via INTRAVENOUS
  Filled 2011-07-27 (×2): qty 25

## 2011-07-27 MED ORDER — CEFAZOLIN SODIUM 1-5 GM-% IV SOLN
1.0000 g | Freq: Four times a day (QID) | INTRAVENOUS | Status: AC
  Administered 2011-07-27 – 2011-07-28 (×3): 1 g via INTRAVENOUS
  Filled 2011-07-27 (×3): qty 50

## 2011-07-27 MED ORDER — ACETAMINOPHEN 10 MG/ML IV SOLN
1000.0000 mg | Freq: Four times a day (QID) | INTRAVENOUS | Status: AC
  Administered 2011-07-27 (×3): 1000 mg via INTRAVENOUS
  Filled 2011-07-27 (×4): qty 100

## 2011-07-27 MED ORDER — NEOSTIGMINE METHYLSULFATE 1 MG/ML IJ SOLN
INTRAMUSCULAR | Status: DC | PRN
  Administered 2011-07-27: 4 mg via INTRAVENOUS

## 2011-07-27 MED ORDER — ONDANSETRON HCL 4 MG/2ML IJ SOLN
4.0000 mg | Freq: Four times a day (QID) | INTRAMUSCULAR | Status: DC | PRN
  Filled 2011-07-27: qty 2

## 2011-07-27 MED ORDER — DOCUSATE SODIUM 100 MG PO CAPS
100.0000 mg | ORAL_CAPSULE | Freq: Two times a day (BID) | ORAL | Status: DC
  Administered 2011-07-27 – 2011-07-30 (×7): 100 mg via ORAL
  Filled 2011-07-27 (×8): qty 1

## 2011-07-27 MED ORDER — ONDANSETRON HCL 4 MG/2ML IJ SOLN
4.0000 mg | Freq: Four times a day (QID) | INTRAMUSCULAR | Status: DC | PRN
  Administered 2011-07-28: 4 mg via INTRAVENOUS

## 2011-07-27 MED ORDER — DIPHENHYDRAMINE HCL 12.5 MG/5ML PO ELIX: 12.5000 mg | ORAL_SOLUTION | Freq: Four times a day (QID) | ORAL | Status: DC | PRN

## 2011-07-27 MED ORDER — HYOSCYAMINE SULFATE 0.125 MG SL SUBL
0.1250 mg | SUBLINGUAL_TABLET | SUBLINGUAL | Status: DC | PRN
  Filled 2011-07-27: qty 1

## 2011-07-27 MED ORDER — RIVAROXABAN 10 MG PO TABS
10.0000 mg | ORAL_TABLET | Freq: Every day | ORAL | Status: DC
  Administered 2011-07-28 – 2011-07-30 (×3): 10 mg via ORAL
  Filled 2011-07-27 (×3): qty 1

## 2011-07-27 MED ORDER — GABAPENTIN 300 MG PO CAPS
300.0000 mg | ORAL_CAPSULE | Freq: Two times a day (BID) | ORAL | Status: DC
  Administered 2011-07-27 – 2011-07-30 (×6): 300 mg via ORAL
  Filled 2011-07-27 (×8): qty 1

## 2011-07-27 MED ORDER — SUMATRIPTAN 20 MG/ACT NA SOLN: 1.0000 | NASAL | Status: DC | PRN

## 2011-07-27 MED ORDER — PROPOFOL 10 MG/ML IV EMUL
INTRAVENOUS | Status: DC | PRN
  Administered 2011-07-27: 200 mg via INTRAVENOUS

## 2011-07-27 MED ORDER — ONDANSETRON HCL 4 MG PO TABS: 4.0000 mg | ORAL_TABLET | Freq: Four times a day (QID) | ORAL | Status: DC | PRN

## 2011-07-27 MED ORDER — FENTANYL CITRATE 0.05 MG/ML IJ SOLN
INTRAMUSCULAR | Status: DC | PRN
  Administered 2011-07-27 (×4): 50 ug via INTRAVENOUS

## 2011-07-27 MED ORDER — BISACODYL 10 MG RE SUPP
10.0000 mg | Freq: Every day | RECTAL | Status: DC | PRN
  Administered 2011-07-29: 10 mg via RECTAL
  Filled 2011-07-27: qty 1

## 2011-07-27 MED ORDER — SODIUM CHLORIDE 0.9 % IJ SOLN: 9.0000 mL | INTRAMUSCULAR | Status: DC | PRN

## 2011-07-27 MED ORDER — LACTATED RINGERS IV SOLN
INTRAVENOUS | Status: DC | PRN
  Administered 2011-07-27 (×2): via INTRAVENOUS

## 2011-07-27 MED ORDER — KCL IN DEXTROSE-NACL 20-5-0.9 MEQ/L-%-% IV SOLN
INTRAVENOUS | Status: DC
  Administered 2011-07-27 – 2011-07-28 (×3): via INTRAVENOUS
  Filled 2011-07-27 (×3): qty 1000

## 2011-07-27 MED ORDER — MENTHOL 3 MG MT LOZG: 1.0000 | LOZENGE | OROMUCOSAL | Status: DC | PRN

## 2011-07-27 MED ORDER — CEFAZOLIN SODIUM-DEXTROSE 2-3 GM-% IV SOLR
2.0000 g | Freq: Once | INTRAVENOUS | Status: AC
  Administered 2011-07-27: 2 g via INTRAVENOUS

## 2011-07-27 MED ORDER — LIDOCAINE HCL (CARDIAC) 20 MG/ML IV SOLN
INTRAVENOUS | Status: DC | PRN
  Administered 2011-07-27: 100 mg via INTRAVENOUS

## 2011-07-27 MED ORDER — DEXAMETHASONE SODIUM PHOSPHATE 4 MG/ML IJ SOLN
INTRAMUSCULAR | Status: DC | PRN
  Administered 2011-07-27: 10 mg via INTRAVENOUS

## 2011-07-27 MED ORDER — METHOCARBAMOL 100 MG/ML IJ SOLN
500.0000 mg | Freq: Four times a day (QID) | INTRAVENOUS | Status: DC | PRN
  Administered 2011-07-27 – 2011-07-28 (×3): 500 mg via INTRAVENOUS
  Filled 2011-07-27 (×3): qty 5

## 2011-07-27 MED ORDER — METOCLOPRAMIDE HCL 5 MG PO TABS: 5.0000 mg | ORAL_TABLET | Freq: Three times a day (TID) | ORAL | Status: DC | PRN

## 2011-07-27 MED ORDER — METHOCARBAMOL 500 MG PO TABS
500.0000 mg | ORAL_TABLET | Freq: Four times a day (QID) | ORAL | Status: DC | PRN
  Administered 2011-07-28 – 2011-07-30 (×8): 500 mg via ORAL
  Filled 2011-07-27 (×8): qty 1

## 2011-07-27 MED ORDER — FENTANYL CITRATE 0.05 MG/ML IJ SOLN
50.0000 ug | INTRAMUSCULAR | Status: DC | PRN
  Administered 2011-07-27: 50 ug via INTRAVENOUS

## 2011-07-27 MED ORDER — OXYCODONE HCL 5 MG PO TABS
5.0000 mg | ORAL_TABLET | ORAL | Status: DC | PRN
  Administered 2011-07-27: 5 mg via ORAL
  Administered 2011-07-27 (×2): 10 mg via ORAL
  Administered 2011-07-27: 5 mg via ORAL
  Administered 2011-07-28 (×5): 10 mg via ORAL
  Administered 2011-07-28: 5 mg via ORAL
  Administered 2011-07-28 – 2011-07-29 (×6): 10 mg via ORAL
  Administered 2011-07-29: 5 mg via ORAL
  Administered 2011-07-29 – 2011-07-30 (×6): 10 mg via ORAL
  Filled 2011-07-27 (×10): qty 2
  Filled 2011-07-27: qty 1
  Filled 2011-07-27 (×12): qty 2

## 2011-07-27 MED ORDER — FENTANYL CITRATE 0.05 MG/ML IJ SOLN: 50.0000 ug | INTRAMUSCULAR | Status: DC | PRN

## 2011-07-27 SURGICAL SUPPLY — 56 items
BAG SPEC THK2 15X12 ZIP CLS (MISCELLANEOUS) ×1
BAG ZIPLOCK 12X15 (MISCELLANEOUS) ×2 IMPLANT
BANDAGE ELASTIC 6 VELCRO ST LF (GAUZE/BANDAGES/DRESSINGS) ×2 IMPLANT
BANDAGE ESMARK 6X9 LF (GAUZE/BANDAGES/DRESSINGS) ×1 IMPLANT
BLADE SAG 18X100X1.27 (BLADE) ×2 IMPLANT
BLADE SAW SGTL 11.0X1.19X90.0M (BLADE) ×2 IMPLANT
BNDG CMPR 9X6 STRL LF SNTH (GAUZE/BANDAGES/DRESSINGS) ×1
BNDG ESMARK 6X9 LF (GAUZE/BANDAGES/DRESSINGS) ×2
BOWL SMART MIX CTS (DISPOSABLE) ×2 IMPLANT
CATH KIT ON-Q SILVERSOAK 5 (CATHETERS) ×1 IMPLANT
CATH KIT ON-Q SILVERSOAK 5IN (CATHETERS) ×2 IMPLANT
CEMENT HV SMART SET (Cement) ×4 IMPLANT
CLOSURE STERI STRIP 1/2 X4 (GAUZE/BANDAGES/DRESSINGS) ×1 IMPLANT
CLOTH BEACON ORANGE TIMEOUT ST (SAFETY) ×2 IMPLANT
COVER SURGICAL LIGHT HANDLE (MISCELLANEOUS) ×2 IMPLANT
CUFF TOURN SGL QUICK 34 (TOURNIQUET CUFF) ×2
CUFF TRNQT CYL 34X4X40X1 (TOURNIQUET CUFF) ×1 IMPLANT
DRAPE EXTREMITY T 121X128X90 (DRAPE) ×2 IMPLANT
DRAPE POUCH INSTRU U-SHP 10X18 (DRAPES) ×2 IMPLANT
DRAPE U-SHAPE 47X51 STRL (DRAPES) ×2 IMPLANT
DRSG ADAPTIC 3X8 NADH LF (GAUZE/BANDAGES/DRESSINGS) ×2 IMPLANT
DRSG PAD ABDOMINAL 8X10 ST (GAUZE/BANDAGES/DRESSINGS) ×1 IMPLANT
DURAPREP 26ML APPLICATOR (WOUND CARE) ×2 IMPLANT
ELECT REM PT RETURN 9FT ADLT (ELECTROSURGICAL) ×2
ELECTRODE REM PT RTRN 9FT ADLT (ELECTROSURGICAL) ×1 IMPLANT
EVACUATOR 1/8 PVC DRAIN (DRAIN) ×2 IMPLANT
FACESHIELD LNG OPTICON STERILE (SAFETY) ×10 IMPLANT
GAUZE SPONGE 4X4 12PLY STRL LF (GAUZE/BANDAGES/DRESSINGS) ×1 IMPLANT
GLOVE BIO SURGEON STRL SZ7.5 (GLOVE) ×2 IMPLANT
GLOVE BIO SURGEON STRL SZ8 (GLOVE) ×2 IMPLANT
GLOVE BIOGEL PI IND STRL 8 (GLOVE) ×2 IMPLANT
GLOVE BIOGEL PI INDICATOR 8 (GLOVE) ×2
GOWN STRL NON-REIN LRG LVL3 (GOWN DISPOSABLE) ×2 IMPLANT
GOWN STRL REIN XL XLG (GOWN DISPOSABLE) ×2 IMPLANT
HANDPIECE INTERPULSE COAX TIP (DISPOSABLE) ×2
IMMOBILIZER KNEE 20 (SOFTGOODS) ×2
IMMOBILIZER KNEE 20 THIGH 36 (SOFTGOODS) ×1 IMPLANT
KIT BASIN OR (CUSTOM PROCEDURE TRAY) ×2 IMPLANT
MANIFOLD NEPTUNE II (INSTRUMENTS) ×2 IMPLANT
NS IRRIG 1000ML POUR BTL (IV SOLUTION) ×2 IMPLANT
PACK TOTAL JOINT (CUSTOM PROCEDURE TRAY) ×2 IMPLANT
PAD ABD 7.5X8 STRL (GAUZE/BANDAGES/DRESSINGS) ×2 IMPLANT
PADDING CAST COTTON 6X4 STRL (CAST SUPPLIES) ×4 IMPLANT
POSITIONER SURGICAL ARM (MISCELLANEOUS) ×2 IMPLANT
SET HNDPC FAN SPRY TIP SCT (DISPOSABLE) ×1 IMPLANT
SPONGE GAUZE 4X4 12PLY (GAUZE/BANDAGES/DRESSINGS) ×2 IMPLANT
STRIP CLOSURE SKIN 1/2X4 (GAUZE/BANDAGES/DRESSINGS) ×4 IMPLANT
SUCTION FRAZIER 12FR DISP (SUCTIONS) ×2 IMPLANT
SUT MNCRL AB 4-0 PS2 18 (SUTURE) ×2 IMPLANT
SUT PDS AB 1 CT1 27 (SUTURE) ×6 IMPLANT
SUT VIC AB 2-0 CT1 27 (SUTURE) ×6
SUT VIC AB 2-0 CT1 TAPERPNT 27 (SUTURE) ×3 IMPLANT
TOWEL OR 17X26 10 PK STRL BLUE (TOWEL DISPOSABLE) ×4 IMPLANT
TRAY FOLEY CATH 14FRSI W/METER (CATHETERS) ×2 IMPLANT
WATER STERILE IRR 1500ML POUR (IV SOLUTION) ×2 IMPLANT
WRAP KNEE MAXI GEL POST OP (GAUZE/BANDAGES/DRESSINGS) ×4 IMPLANT

## 2011-07-27 NOTE — Interval H&P Note (Signed)
History and Physical Interval Note:  07/27/2011 7:10 AM  Kathy Clark [place]  has presented today for surgery, with the diagnosis of Osteoarthritis of the Left Knee  The various methods of treatment have been discussed with the patient and family. After consideration of risks, benefits and other options for treatment, the patient has consented to  Procedure(s): TOTAL KNEE ARTHROPLASTY as a surgical intervention .  The patients' history has been reviewed, patient examined, no change in status, stable for surgery.  I have reviewed the patients' chart and labs.  Questions were answered to the patient's satisfaction.     Loanne Drilling

## 2011-07-27 NOTE — H&P (View-Only) (Signed)
Kathy Clark  DOB: 02/16/1961 Married / Female  Date of Admission:  07/27/2011  Chief Complaint:  Left Knee Pain  History of Present Illness The patient is a 50 year old female who comes in today for a preoperative History and Physical. The patient is scheduled for a left total knee arthroplasty to be performed by Dr. Frank V. Aluisio, MD at [facility] on 07/27/2011. They have been treated conservatively in the past for the above stated problem and despite conservative measures, they continue to have progressive pain and severe functional limitations and dysfunction. It is felt that they would benefit from undergoing total joint replacement. Risks and benefits of the procedure have been discussed with the patient and they elect to proceed with surgery. There are no active contraindications to surgery such as ongoing infection or rapidly progressive neurological disease.  Problem List/Past Medical Impaired Vision. wears glasses Shingles Degenerative Disc Disease Menopause Chronic Pain High blood pressure Irritable bowel syndrome Migraine Headache Osteoarthritis Peripheral Neuropathy Skin Cancer  Allergies Anectine *NEUROMUSCULAR AGENTS*. V-Tach  Intolerance Dilaudid *ANALGESICS - OPIOID*. Nausea, Vomiting.  Family History Cancer. mother and grandfather fathers side Cerebrovascular Accident. grandmother fathers side Drug / Alcohol Addiction. grandfather mothers side Heart Disease. grandmother fathers side Hypertension. mother and father Liver Disease, Chronic. grandfather mothers side Osteoarthritis. mother, brother, grandmother mothers side, grandfather mothers side and child Father. Living, Hypertension. Age 78, Hyperlipidemia Mother. Living, Breast Cancer. age 77, Tongue Cancer  Social History Alcohol use. current drinker; drinks wine and hard liquor; only occasionally per week Children. 2 Current work status. working full  time Drug/Alcohol Rehab (Currently). no Drug/Alcohol Rehab (Previously). no Exercise. Exercises daily; does running / walking and gym / weights Illicit drug use. no Living situation. live with spouse Marital status. married Number of flights of stairs before winded. 2-3 Pain Contract. no Tobacco / smoke exposure. no Tobacco use. never smoker Current occupation. RN Post-Surgical Plans. Plan is for Home Pregnant. no  Medication History Vivelle-Dot (0.05MG/24HR Patch Biweekly, Transdermal) Active. Gabapentin (300MG Capsule, Oral two times daily) Active. Lisinopril-Hydrochlorothiazide (20-25MG Tablet, Oral daily) Active. Multivitamin ( Oral daily) Active. Calcium + D ( Oral) Specific dose unknown - Active. Omega 3 (1000MG Capsule, Oral two times daily) Active. Levsin (0.125MG Tablet, Oral as needed) Active. Imitrex (20MG/ACT Solution, Nasal as needed) Active.  Past Surgical History Arthroscopy of Knee. left, times 3 Arthroscopy of Shoulder. Date: 2011. right Hysterectomy. Date: 2009. partial (non-cancerous) Spinal Surgery Left Knee Patella Surgery. Date: 1982.  Review of Systems General:Not Present- Chills, Fever, Night Sweats, Appetite Loss, Fatigue, Feeling sick, Weight Gain and Weight Loss. Skin:Not Present- Itching, Rash, Skin Color Changes, Ulcer, Psoriasis and Change in Hair or Nails. HEENT:Not Present- Sensitivity to light, Hearing problems, Nose Bleed and Ringing in the Ears. Neck:Not Present- Swollen Glands and Neck Mass. Respiratory:Not Present- Snoring, Chronic Cough, Bloody sputum and Dyspnea. Cardiovascular:Not Present- Shortness of Breath, Chest Pain, Swelling of Extremities, Leg Cramps and Palpitations. Gastrointestinal:Not Present- Bloody Stool, Heartburn, Abdominal Pain, Vomiting, Nausea and Incontinence of Stool. Female Genitourinary:Not Present- Blood in Urine, Menstrual Irregularities, Frequency, Incontinence and  Nocturia. Musculoskeletal:Present- Muscle Pain, Joint Stiffness, Joint Swelling, Joint Pain and Back Pain. Not Present- Muscle Weakness. Neurological:Not Present- Tingling, Numbness, Burning, Tremor, Headaches and Dizziness. Psychiatric:Not Present- Anxiety, Depression and Memory Loss. Endocrine:Not Present- Cold Intolerance, Heat Intolerance, Excessive hunger and Excessive Thirst. Hematology:Not Present- Abnormal Bleeding, Anemia, Blood Clots and Easy Bruising.  Vitals Weight: 210 lb  Pulse: 84 (Regular) Resp.: 14 (Unlabored) BP: 118/86 (Sitting, Left   Arm, Standard)  Physical Exam The physical exam findings are as follows:  General Mental Status - Alert, cooperative and good historian. General Appearance- pleasant. Not in acute distress. Orientation- Oriented X3. Build & Nutrition- Well nourished and Well developed.  Head and Neck Head- normocephalic, atraumatic . Neck Global Assessment- supple. no bruit auscultated on the right and no bruit auscultated on the left. Pupil- Bilateral- Regular and Round. Note: Wears glasses Motion- Bilateral- EOMI.  Chest and Lung Exam Auscultation: Breath sounds:- clear at anterior chest wall and - clear at posterior chest wall. Adventitious sounds:- No Adventitious sounds.  Cardiovascular Auscultation:Rhythm- Regular rate and rhythm. Heart Sounds- S1 WNL and S2 WNL. Murmurs & Other Heart Sounds:Auscultation of the heart reveals - No Murmurs.  Abdomen Palpation/Percussion:Tenderness- Abdomen is non-tender to palpation. Rigidity (guarding)- Abdomen is soft. Auscultation:Auscultation of the abdomen reveals - Bowel sounds normal.   Female Genitourinary Not done, not pertinent to present illness  Musculoskeletal Left knee weel healed transvers scar. ROM 5 -130 Marked crepitus on exam. More tender medial than lateral. No instability.  RADIOGRAPHS: AP and Lateral x-rays shows advanced endstage  arthritis left knee.  Assessment & Plan Osteoarthrosis Left Knee  Patient is for a Left Total Knee Replacement by Dr. Aluisio.  Plan is to go home after surgery..  Patient may need hospital bed for home.  Drew Jozy Mcphearson, PA-C  PCP - Dr. Metheney   

## 2011-07-27 NOTE — Op Note (Signed)
Pre-operative diagnosis- Osteoarthritis  Left knee(s)  Post-operative diagnosis- Osteoarthritis Right knee(s)  Procedure-  Left  Total Knee Arthroplasty  Surgeon- Gus Rankin. Wendle Kina, MD  Assistant- Avel Peace, PA-C   Anesthesia-  General EBL-* No blood loss amount entered *  Drains Hemovac  Tourniquet time-  Total Tourniquet Time Documented: Thigh (Left) - 40 minutes   Complications- None  Condition-PACU - hemodynamically stable.   Brief Clinical Note  Kathy Clark is a 51 y.o. year old female with end stage OA of her left knee with progressively worsening pain and dysfunction. She has constant pain, with activity and at rest and significant functional deficits with difficulties even with ADLs. She has had extensive non-op management including analgesics, injections of cortisone and viscosupplements, and home exercise program, but remains in significant pain with significant dysfunction. Radiographs show bone on bone arthritis. She presents now for left Total Knee Arthroplasty.    Procedure in detail---   The patient is brought into the operating room and positioned supine on the operating table. After successful administration of  General,   a tourniquet is placed high on the  Left thigh(s) and the lower extremity is prepped and draped in the usual sterile fashion. Time out is performed by the operating team and then the  Left lower extremity is wrapped in Esmarch, knee flexed and the tourniquet inflated to 300 mmHg.       A midline incision is made with a ten blade through the subcutaneous tissue to the level of the extensor mechanism. A fresh blade is used to make a medial parapatellar arthrotomy. Soft tissue over the proximal medial tibia is subperiosteally elevated to the joint line with a knife and into the semimembranosus bursa with a Cobb elevator. Soft tissue over the proximal lateral tibia is elevated with attention being paid to avoiding the patellar tendon on the tibial  tubercle. The patella is everted, knee flexed 90 degrees and the ACL and PCL are removed. Findings are bone on bone all 3 compartments.        The drill is used to create a starting hole in the distal femur and the canal is thoroughly irrigated with sterile saline to remove the fatty contents. The 5 degree Right  valgus alignment guide is placed into the femoral canal and the distal femoral cutting block is pinned to remove 11 mm off the distal femur. Resection is made with an oscillating saw.      The tibia is subluxed forward and the menisci are removed. The extramedullary alignment guide is placed referencing proximally at the medial aspect of the tibial tubercle and distally along the second metatarsal axis and tibial crest. The block is pinned to remove 2mm off the more deficient medial  side. Resection is made with an oscillating saw. Size 4is the most appropriate size for the tibia and the proximal tibia is prepared with the modular drill and keel punch for that size.      The femoral sizing guide is placed and size 5 is most appropriate. Rotation is marked off the epicondylar axis and confirmed by creating a rectangular flexion gap at 90 degrees. The size 5 cutting block is pinned in this rotation and the anterior, posterior and chamfer cuts are made with the oscillating saw. The intercondylar block is then placed and that cut is made.      Trial size 4 tibial component, trial size 5 posterior stabilized femur and a 12.5  mm posterior stabilized rotating platform insert trial  is placed. Full extension is achieved with excellent varus/valgus and anterior/posterior balance throughout full range of motion. The patella is everted and thickness measured to be 26  mm. Free hand resection is taken to 15 mm, a 38 template is placed, lug holes are drilled, trial patella is placed, and it tracks normally. Osteophytes are removed off the posterior femur with the trial in place. All trials are removed and the cut  bone surfaces prepared with pulsatile lavage. Cement is mixed and once ready for implantation, the size 4 tibial implant, size  5 posterior stabilized femoral component, and the size 38 patella are cemented in place and the patella is held with the clamp. The trial insert is placed and the knee held in full extension. All extruded cement is removed and once the cement is hard the permanent 12.5 mm posterior stabilized rotating platform insert is placed into the tibial tray.      The wound is copiously irrigated with saline solution and the extensor mechanism closed over a hemovac drain with #1 PDS suture. The tourniquet is released for a total tourniquet time of 40  minutes. Flexion against gravity is 140 degrees and the patella tracks normally. Subcutaneous tissue is closed with 2.0 vicryl and subcuticular with running 4.0 Monocryl. The catheter for the Marcaine pain pump is placed and the pump is initiated. The incision is cleaned and dried and steri-strips and a bulky sterile dressing are applied. The limb is placed into a knee immobilizer and the patient is awakened and transported to recovery in stable condition.      Please note that a surgical assistant was a medical necessity for this procedure in order to perform it in a safe and expeditious manner. Surgical assistant was necessary to retract the ligaments and vital neurovascular structures to prevent injury to them and also necessary for proper positioning of the limb to allow for anatomic placement of the prosthesis.   Gus Rankin Goldie Dimmer, MD    07/27/2011, 8:37 AM

## 2011-07-27 NOTE — Preoperative (Signed)
Beta Blockers   Reason not to administer Beta Blockers:Not Applicable 

## 2011-07-27 NOTE — Anesthesia Preprocedure Evaluation (Addendum)
Anesthesia Evaluation  Patient identified by MRN, date of birth, ID band Patient awake  General Assessment Comment:Note Allergies  Reviewed: Allergy & Precautions, H&P , NPO status , Patient's Chart, lab work & pertinent test results, reviewed documented beta blocker date and time   Airway Mallampati: II TM Distance: >3 FB Neck ROM: Full    Dental   Pulmonary neg pulmonary ROS,  clear to auscultation        Cardiovascular hypertension, Pt. on medications Regular Normal denies cardiac symptoms   Neuro/Psych Negative Neurological ROS  Negative Psych ROS   GI/Hepatic negative GI ROS, Neg liver ROS,   Endo/Other  Negative Endocrine ROS  Renal/GU negative Renal ROS  Genitourinary negative   Musculoskeletal negative musculoskeletal ROS (+)   Abdominal   Peds negative pediatric ROS (+)  Hematology negative hematology ROS (+)   Anesthesia Other Findings Bridges on side  Reproductive/Obstetrics negative OB ROS                           Anesthesia Physical Anesthesia Plan  ASA: II  Anesthesia Plan: General   Post-op Pain Management:    Induction: Intravenous  Airway Management Planned: Oral ETT  Additional Equipment:   Intra-op Plan:   Post-operative Plan: Extubation in OR  Informed Consent: I have reviewed the patients History and Physical, chart, labs and discussed the procedure including the risks, benefits and alternatives for the proposed anesthesia with the patient or authorized representative who has indicated his/her understanding and acceptance.     Plan Discussed with: CRNA and Surgeon  Anesthesia Plan Comments:         Anesthesia Quick Evaluation

## 2011-07-27 NOTE — Anesthesia Procedure Notes (Signed)
Procedure Name: Intubation Date/Time: 07/27/2011 7:23 AM Performed by: Lurlean Leyden, Aldin Drees L. Patient Re-evaluated:Patient Re-evaluated prior to inductionOxygen Delivery Method: Circle System Utilized Preoxygenation: Pre-oxygenation with 100% oxygen Intubation Type: IV induction Ventilation: Mask ventilation without difficulty and Oral airway inserted - appropriate to patient size Laryngoscope Size: Miller and 2 Grade View: Grade I Tube type: Oral Tube size: 7.5 mm Airway Equipment and Method: stylet Placement Confirmation: ETT inserted through vocal cords under direct vision,  breath sounds checked- equal and bilateral and positive ETCO2 Secured at: 22 cm Tube secured with: Tape Dental Injury: Teeth and Oropharynx as per pre-operative assessment

## 2011-07-27 NOTE — Transfer of Care (Signed)
Immediate Anesthesia Transfer of Care Note  Patient: Kathy Clark  Procedure(s) Performed:  TOTAL KNEE ARTHROPLASTY  Patient Location: PACU  Anesthesia Type: General  Level of Consciousness: awake, alert  and oriented  Airway & Oxygen Therapy: Patient Spontanous Breathing and Patient connected to face mask oxygen  Post-op Assessment: Report given to PACU RN and Post -op Vital signs reviewed and stable  Post vital signs: Reviewed and stable Filed Vitals:   07/27/11 0523  BP: 152/99  Pulse: 73  Temp: 36.4 C  Resp: 18    Complications: No apparent anesthesia complications

## 2011-07-27 NOTE — Anesthesia Postprocedure Evaluation (Signed)
  Anesthesia Post-op Note  Patient: Kathy Clark  Procedure(s) Performed:  TOTAL KNEE ARTHROPLASTY  Patient Location: PACU  Anesthesia Type: General  Level of Consciousness: oriented and sedated  Airway and Oxygen Therapy: Patient Spontanous Breathing and Patient connected to nasal cannula oxygen  Post-op Pain: mild  Post-op Assessment: Post-op Vital signs reviewed, Patient's Cardiovascular Status Stable, Respiratory Function Stable and Patent Airway  Post-op Vital Signs: stable  Complications: No apparent anesthesia complications

## 2011-07-28 LAB — CBC
HCT: 34.4 % — ABNORMAL LOW (ref 36.0–46.0)
MCV: 88 fL (ref 78.0–100.0)
RBC: 3.91 MIL/uL (ref 3.87–5.11)
RDW: 12.8 % (ref 11.5–15.5)
WBC: 11.6 10*3/uL — ABNORMAL HIGH (ref 4.0–10.5)

## 2011-07-28 LAB — BASIC METABOLIC PANEL
BUN: 7 mg/dL (ref 6–23)
CO2: 26 mEq/L (ref 19–32)
Chloride: 103 mEq/L (ref 96–112)
Creatinine, Ser: 0.59 mg/dL (ref 0.50–1.10)
Glucose, Bld: 125 mg/dL — ABNORMAL HIGH (ref 70–99)

## 2011-07-28 MED ORDER — MORPHINE SULFATE 2 MG/ML IJ SOLN
1.0000 mg | INTRAMUSCULAR | Status: DC | PRN
  Administered 2011-07-28: 1 mg via INTRAVENOUS
  Filled 2011-07-28: qty 1

## 2011-07-28 NOTE — Progress Notes (Signed)
Subjective: 1 Day Post-Op Procedure(s) (LRB): TOTAL KNEE ARTHROPLASTY (Left) Patient reports pain as mild.   Patient seen in rounds by Dr. Lequita Halt. Patient has complaints of some pain thru the night. We will start therapy today. Plan is to go home after hospital stay.  Objective: Vital signs in last 24 hours: Temp:  [96.5 F (35.8 C)-98.5 F (36.9 C)] 98.2 F (36.8 C) (01/15 0525) Pulse Rate:  [53-85] 75  (01/15 0525) Resp:  [10-16] 16  (01/15 0525) BP: (107-175)/(71-112) 117/77 mmHg (01/15 0525) SpO2:  [98 %-100 %] 100 % (01/15 0525) FiO2 (%):  [2 %] 2 % (01/15 0525) Weight:  [95.255 kg (210 lb)] 95.255 kg (210 lb) (01/14 1133)  Intake/Output from previous day:  Intake/Output Summary (Last 24 hours) at 07/28/11 0919 Last data filed at 07/28/11 0750  Gross per 24 hour  Intake   3245 ml  Output   4155 ml  Net   -910 ml    Intake/Output this shift: Total I/O In: 360 [P.O.:360] Out: -   Labs:  Basename 07/28/11 0440  HGB 11.7*    Basename 07/28/11 0440  WBC 11.6*  RBC 3.91  HCT 34.4*  PLT 240    Basename 07/28/11 0440  NA 138  K 3.6  CL 103  CO2 26  BUN 7  CREATININE 0.59  GLUCOSE 125*  CALCIUM 8.3*   No results found for this basename: LABPT:2,INR:2 in the last 72 hours  Exam - Neurovascular intact Sensation intact distally Dressing - clean, dry Motor function intact - moving foot and toes well on exam.  Hemovac pulled without difficulty.  Past Medical History  Diagnosis Date  . Lymphocytic colitis     microscopic  . Migraines   . Essential hypertension, benign   . Osteoarthritis   . 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 No vaccines. D/C Morphine PCA,  Change to IV push D/C O2 and Pulse OX and try on Room [address]  Patrica Duel 07/28/2011, 9:19 AM

## 2011-07-28 NOTE — Progress Notes (Signed)
  CARE MANAGEMENT NOTE 07/28/2011  Patient:  Kathy Clark, Kathy Clark   Account Number:  [id number]  Date Initiated:  07/28/2011  Documentation initiated by:  Colleen Can  Subjective/Objective Assessment:   dx osteoarthritis left knee; total knee replacemnt     Action/Plan:   Cm spoke with patient regarding discharge plans.  Patient plans to return to her home in [place], [place] where spouse will be caregiver . She is requesting [facility] for hh services   Anticipated DC Date:  07/30/2011   Anticipated DC Plan:  HOME W HOME HEALTH SERVICES  In-house referral  NA      DC Planning Services  CM consult      [facility] Choice  HOME HEALTH   Choice offered to / List presented to:  C-1 Patient        HH arranged  HH-2 PT      [facility] agency  [facility]   Status of service:  In process, will continue to follow Comments:  07/28/2011 Raynelle Bring BSN CCM [phone] Copy of [facility] agencies for choice placed in shadow chart.

## 2011-07-28 NOTE — Progress Notes (Signed)
Physical Therapy Evaluation Patient Details Name: Kathy Clark MRN: [id number] DOB: 1961/05/09 Today's Date: 07/28/2011 0936 - 1006; EVAL Problem List:  Patient Active Problem List  Diagnoses  . MIGRAINE WITH AURA  . HYPERTENSION, BENIGN ESSENTIAL  . ALLERGIC RHINITIS  . OSTEOARTHRITIS, KNEE, LEFT  . SCIATICA, CHRONIC  . SKIN CANCER, HX OF  . COLITIS, HX OF    Past Medical History:  Past Medical History  Diagnosis Date  . Lymphocytic colitis     microscopic  . Migraines   . Essential hypertension, benign   . Osteoarthritis   . 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 Microdiscetomy 2005  . Abdominal hysterectomy 07-2005  . Knee surgery 2006    left; multiple  . Skin cancer excision     eyelid  . Back surgery   . Shoulder surgery 07-22-09    right    PT Assessment/Plan/Recommendation PT Assessment Clinical Impression Statement: Pt with L TKR presents with decreased L LE strength/ROM and ltd functional moblity.  Pt will benefit from skilled PT intervention to maximize IND for d/c home. PT Recommendation/Assessment: Patient will need skilled PT in the acute care venue PT Problem List: Decreased strength;Decreased range of motion;Decreased activity tolerance;Decreased mobility;Decreased knowledge of use of DME;Pain PT Therapy Diagnosis : Difficulty walking PT Plan PT Frequency: 7X/week PT Treatment/Interventions: DME instruction;Gait training;Stair training;Functional mobility training;Therapeutic activities;Therapeutic exercise;Patient/family education PT Recommendation Recommendations for Other Services: OT consult Follow Up Recommendations: Home health PT Equipment Recommended: Rolling walker with 5" wheels PT Goals  Acute Rehab PT Goals PT Goal Formulation: With patient Time For Goal Achievement: 7 days Pt will go Supine/Side to Sit: with supervision PT Goal: Supine/Side  to Sit - Progress: Progressing toward goal Pt will go Sit to Supine/Side: with supervision PT Goal: Sit to Supine/Side - Progress: Not met Pt will go Sit to Stand: with supervision PT Goal: Sit to Stand - Progress: Not met Pt will go Stand to Sit: with supervision PT Goal: Stand to Sit - Progress: Progressing toward goal Pt will Ambulate: 51 - 150 feet;with supervision;with rolling walker PT Goal: Ambulate - Progress: Progressing toward goal Pt will Go Up / Down Stairs: Flight;with min assist;with least restrictive assistive device (2 step with no rail to enter; 14 with rail to bedroom) PT Goal: Up/Down Stairs - Progress: Not met  PT Evaluation Precautions/Restrictions  Precautions Precautions: Knee Required Braces or Orthoses: Yes Restrictions LLE Weight Bearing: Weight bearing as tolerated Prior Functioning  Home Living Lives With: Spouse Receives Help From: Family Type of Home: House Home Layout: Two level Alternate Level Stairs-Rails: Left Alternate Level Stairs-Number of Steps: 14 Home Access: Stairs to enter Entrance Stairs-Rails: None Entrance Stairs-Number of Steps: 2 Prior Function Level of Independence: Independent with basic ADLs;Independent with gait;Independent with transfers Able to Take Stairs?: Yes Cognition Cognition Arousal/Alertness: Awake/alert Overall Cognitive Status: Appears within functional limits for tasks assessed Orientation Level: Oriented X4 Sensation/Coordination Coordination Gross Motor Movements are Fluid and Coordinated: Yes Extremity Assessment RUE Assessment RUE Assessment: Within Functional Limits LUE Assessment LUE Assessment: Within Functional Limits RLE Assessment RLE Assessment: Within Functional Limits LLE Assessment LLE Assessment: Exceptions to WFL (=10 - 30; 2/5 quads) Mobility (including Balance) Bed Mobility Bed Mobility: Yes Supine to Sit: 4: Min assist Supine to Sit Details (indicate cue type and reason): cues for  sequence/technique; min assist for R LE Transfers Transfers: Yes Sit to Stand: 4:  Min assist;3: Mod assist;With upper extremity assist;From bed Sit to Stand Details (indicate cue type and reason): cues for LE position and use of UEs Stand to Sit: 4: Min assist;With armrests;With upper extremity assist;To chair/3-in-1 Stand to Sit Details: cues for LE position and use of UEs Ambulation/Gait Ambulation/Gait: Yes Ambulation/Gait Assistance: 4: Min assist Ambulation/Gait Assistance Details (indicate cue type and reason): cues for sequence, posture and position from RW Ambulation Distance (Feet): 12 Feet Assistive device: Rolling walker Gait Pattern: Step-to pattern    Exercise  Total Joint Exercises Ankle Circles/Pumps: AROM;Both;10 reps;Supine Quad Sets: AROM;Both;10 reps;Supine Heel Slides: AAROM;10 reps;Supine;Left Straight Leg Raises: AAROM;Left;10 reps;Supine End of Session PT - End of Session Equipment Utilized During Treatment: Gait belt;Left knee immobilizer Activity Tolerance: Other (comment) (ltd by nausea) Patient left: in chair;with call bell in reach;with family/visitor present Nurse Communication: Mobility status for transfers;Mobility status for ambulation General Behavior During Session: [facility] for tasks performed Cognition: [facility] for tasks performed  Marjorie Deprey 07/28/2011, 1:06 PM

## 2011-07-28 NOTE — Progress Notes (Signed)
Physical Therapy Treatment Patient Details Name: SHAGUN WORDELL MRN: [id number] DOB: 1960-11-14 Today's Date: 07/28/2011 1210 - 1228 PT Assessment/Plan  PT - Assessment/Plan PT Plan: Discharge plan remains appropriate PT Frequency: 7X/week Recommendations for Other Services: OT consult Follow Up Recommendations: Home health PT Equipment Recommended: Rolling walker with 5" wheels PT Goals  Acute Rehab PT Goals PT Goal Formulation: With patient Time For Goal Achievement: 7 days Pt will go Supine/Side to Sit: with supervision PT Goal: Supine/Side to Sit - Progress: Progressing toward goal Pt will go Sit to Supine/Side: with supervision PT Goal: Sit to Supine/Side - Progress: Progressing toward goal Pt will go Sit to Stand: with supervision PT Goal: Sit to Stand - Progress: Progressing toward goal Pt will go Stand to Sit: with supervision PT Goal: Stand to Sit - Progress: Progressing toward goal Pt will Ambulate: 51 - 150 feet;with supervision;with rolling walker PT Goal: Ambulate - Progress: Progressing toward goal  PT Treatment Precautions/Restrictions  Precautions Precautions: Knee Required Braces or Orthoses: Yes Restrictions Weight Bearing Restrictions: No LLE Weight Bearing: Weight bearing as tolerated Mobility (including Balance) Bed Mobility Supine to Sit: 4: Min assist Supine to Sit Details (indicate cue type and reason): min assist with L LE Sit to Supine: 4: Min assist Sit to Supine - Details (indicate cue type and reason): min assist with L LE Transfers Sit to Stand: 4: Min assist;From elevated surface;With upper extremity assist;From bed Sit to Stand Details (indicate cue type and reason): cues for use of UEs and LE position Stand to Sit: 4: Min assist;To bed;With upper extremity assist Stand to Sit Details: cues for use of UEs and LE position Ambulation/Gait Ambulation/Gait Assistance: 4: Min assist Ambulation/Gait Assistance Details (indicate cue type and  reason): cues for sequence, posture and position from RW Ambulation Distance (Feet): 75 Feet Assistive device: Rolling walker Gait Pattern: Step-to pattern    Exercise    End of Session PT - End of Session Equipment Utilized During Treatment: Gait belt;Left knee immobilizer Activity Tolerance: Patient tolerated treatment well Patient left: in bed;with call bell in reach Nurse Communication: Mobility status for transfers;Mobility status for ambulation General Behavior During Session: [facility] for tasks performed Cognition: [facility] for tasks performed  Knute Mazzuca 07/28/2011, 2:45 PM

## 2011-07-29 ENCOUNTER — Encounter (HOSPITAL_COMMUNITY): Payer: Self-pay | Admitting: Orthopedic Surgery

## 2011-07-29 LAB — CBC
HCT: 31.5 % — ABNORMAL LOW (ref 36.0–46.0)
Hemoglobin: 10.7 g/dL — ABNORMAL LOW (ref 12.0–15.0)
MCH: 29.8 pg (ref 26.0–34.0)
MCV: 87.7 fL (ref 78.0–100.0)
RBC: 3.59 MIL/uL — ABNORMAL LOW (ref 3.87–5.11)

## 2011-07-29 LAB — BASIC METABOLIC PANEL
CO2: 27 mEq/L (ref 19–32)
Calcium: 8.5 mg/dL (ref 8.4–10.5)
Glucose, Bld: 114 mg/dL — ABNORMAL HIGH (ref 70–99)
Sodium: 134 mEq/L — ABNORMAL LOW (ref 135–145)

## 2011-07-29 NOTE — Progress Notes (Signed)
Occupational Therapy Note Chart reviewed. Pt due for pain meds earlier this am when OT checked on pt. Will try back later this afternoon as able. Judithann Sauger OTR/L [phone] 07/29/2011

## 2011-07-29 NOTE — Progress Notes (Signed)
Subjective: 2 Days Post-Op Procedure(s) (LRB): TOTAL KNEE ARTHROPLASTY (Left) Patient reports pain as moderate.   Patient seen in rounds with Dr. Lequita Halt. Patient has complaints of pain last evening.  Did well with therapy yesterday and walked over 70 feet.  Will see how she does today.  If she does well, maybe this evening or tomorrow.  Objective: Vital signs in last 24 hours: Temp:  [98 F (36.7 C)-100.1 F (37.8 C)] 100.1 F (37.8 C) (01/16 0504) Pulse Rate:  [77-104] 104  (01/16 0504) Resp:  [16] 16  (01/16 0504) BP: (97-136)/(64-83) 97/66 mmHg (01/16 0504) SpO2:  [97 %-99 %] 97 % (01/16 0504)  Intake/Output from previous day:  Intake/Output Summary (Last 24 hours) at 07/29/11 1120 Last data filed at 07/29/11 0900  Gross per 24 hour  Intake 3430.5 ml  Output   6025 ml  Net -2594.5 ml    Intake/Output this shift: Total I/O In: 240 [P.O.:240] Out: -   Labs:  Basename 07/29/11 0509 07/28/11 0440  HGB 10.7* 11.7*    Basename 07/29/11 0509 07/28/11 0440  WBC 8.4 11.6*  RBC 3.59* 3.91  HCT 31.5* 34.4*  PLT 234 240    Basename 07/29/11 0509 07/28/11 0440  NA 134* 138  K 3.7 3.6  CL 99 103  CO2 27 26  BUN 6 7  CREATININE 0.55 0.59  GLUCOSE 114* 125*  CALCIUM 8.5 8.3*   No results found for this basename: LABPT:2,INR:2 in the last 72 hours  Exam - Neurovascular intact Sensation intact distally Dressing/Incision - clean, dry, no drainage Motor function intact - moving foot and toes well on exam.   Past Medical History  Diagnosis Date  . Lymphocytic colitis     microscopic  . Migraines   . Essential hypertension, benign   . Osteoarthritis   . 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

## 2011-07-29 NOTE — Progress Notes (Signed)
Occupational Therapy Evaluation Patient Details Name: Kathy Clark MRN: [id number] DOB: [birth year] Today's Date: 07/29/2011 EV1 [id number] Problem List:  Patient Active Problem List  Diagnoses  . MIGRAINE WITH AURA  . HYPERTENSION, BENIGN ESSENTIAL  . ALLERGIC RHINITIS  . OSTEOARTHRITIS, KNEE, LEFT  . SCIATICA, CHRONIC  . SKIN CANCER, HX OF  . COLITIS, HX OF    Past Medical History:  Past Medical History  Diagnosis Date  . Lymphocytic colitis     microscopic  . Migraines   . Essential hypertension, benign   . Osteoarthritis   . 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 Microdiscetomy 2005  . Abdominal hysterectomy 07-2005  . Knee surgery 2006    left; multiple  . Skin cancer excision     eyelid  . Back surgery   . Shoulder surgery 07-22-09    right  . Total knee arthroplasty 07/27/2011    Procedure: TOTAL KNEE ARTHROPLASTY;  Surgeon: Loanne Drilling;  Location: [facility];  Service: Orthopedics;  Laterality: Left;    OT Assessment/Plan/Recommendation OT Assessment Clinical Impression Statement: Pt POD # 2 TKR. All education completed. Pt will have necessary level of A from family upon d/c. No f/u OT needed.  OT Recommendation/Assessment: Patient does not need any further OT services OT Recommendation Follow Up Recommendations: No OT follow up Equipment Recommended: 3 in 1 bedside comode OT Goals    OT Evaluation Precautions/Restrictions  Precautions Precautions: Knee Required Braces or Orthoses: Yes Knee Immobilizer: Discontinue once straight leg raise with < 10 degree lag (PT performed IND SLR this am) Restrictions Weight Bearing Restrictions: No LLE Weight Bearing: Weight bearing as tolerated Prior Functioning Home Living Lives With: Spouse Receives Help From: Family Type of Home: House Home Layout: Two level Alternate Level Stairs-Rails: Left Alternate Level  Stairs-Number of Steps: 14 Home Access: Stairs to enter Entrance Stairs-Rails: None Bathroom Shower/Tub: Walk-in shower;Door [employer] Toilet: Standard Bathroom Accessibility: Yes How Accessible: Accessible via walker Home Adaptive Equipment: Hand-held shower hose Prior Function Level of Independence: Independent with basic ADLs;Independent with homemaking with ambulation;Independent with transfers;Needs assistance with gait Driving: Yes Vocation: Full time employment ADL ADL Eating/Feeding: Performed;Independent Where Assessed - Eating/Feeding: Bed level Grooming: Performed;Wash/dry hands;Supervision/safety Where Assessed - Grooming: Standing at sink Upper Body Bathing: Simulated;Supervision/safety Where Assessed - Upper Body Bathing: Standing at sink Lower Body Bathing: Simulated;Minimal assistance Where Assessed - Lower Body Bathing: Sit to stand from chair Upper Body Dressing: Simulated;Supervision/safety Where Assessed - Upper Body Dressing: Standing Lower Body Dressing: Simulated;Minimal assistance Where Assessed - Lower Body Dressing: Sit to stand from chair Toilet Transfer: Performed;Supervision/safety Toilet Transfer Method: Proofreader: Raised toilet seat with arms (or 3-in-1 over toilet) Toileting - Clothing Manipulation: Performed;Supervision/safety Where Assessed - Toileting Clothing Manipulation: Sit to stand from 3-in-1 or toilet Toileting - Hygiene: Performed;Supervision/safety Where Assessed - Toileting Hygiene: Sit to stand from 3-in-1 or toilet Tub/Shower Transfer: Supervision/safety Tub/Shower Transfer Method: Other (comment) (posterior method) Tub/Shower Transfer Equipment: Walk in shower Equipment Used: Rolling walker (3:1) Vision/Perception  Vision - History Patient Visual Report: No change from baseline Vision - Assessment Vision Assessment: Vision not tested [employer] Arousal/Alertness: Awake/alert Overall  Cognitive Status: Appears within functional limits for tasks assessed Orientation Level: Oriented X4 Sensation/Coordination   Extremity Assessment RUE Assessment RUE Assessment: Within Functional Limits LUE Assessment LUE Assessment: Within Functional Limits Mobility  Bed Mobility Bed Mobility: Yes Sit to Supine:  5: Supervision;HOB flat Transfers Sit to Stand: 5: Supervision;With upper extremity assist;From chair/3-in-1 Stand to Sit: 5: Supervision;With upper extremity assist;To bed;To chair/3-in-1;With armrests Exercises   End of Session OT - End of Session Equipment Utilized During Treatment: Other (comment) (RW, 3:1) Activity Tolerance: Patient tolerated treatment well Patient left: in bed;with call bell in reach General Behavior During Session: [facility] for tasks performed Cognition: [facility] for tasks performed   Ralphine Hinks A, OTR/L [phone] 07/29/2011, 2:45 PM

## 2011-07-29 NOTE — Progress Notes (Signed)
Physical Therapy Treatment Patient Details Name: CIERRIA HEIGHT MRN: [id number] DOB: [birth year] Today's Date: 07/29/2011 1018 - 1055; GT, TE PT Assessment/Plan  PT - Assessment/Plan PT Plan: Discharge plan remains appropriate PT Frequency: 7X/week Follow Up Recommendations: Home health PT Equipment Recommended: Rolling walker with 5" wheels PT Goals  Acute Rehab PT Goals PT Goal Formulation: With patient Time For Goal Achievement: 7 days Pt will go Supine/Side to Sit: with supervision PT Goal: Supine/Side to Sit - Progress: Progressing toward goal Pt will go Sit to Supine/Side: with supervision Pt will go Sit to Stand: with supervision PT Goal: Sit to Stand - Progress: Progressing toward goal Pt will go Stand to Sit: with supervision PT Goal: Stand to Sit - Progress: Progressing toward goal Pt will Ambulate: 51 - 150 feet;with supervision;with rolling walker PT Goal: Ambulate - Progress: Progressing toward goal Pt will Go Up / Down Stairs: Flight;with min assist;with least restrictive assistive device  PT Treatment Precautions/Restrictions  Precautions Precautions: Knee Required Braces or Orthoses: Yes Knee Immobilizer: Discontinue once straight leg raise with < 10 degree lag (PT performed IND SLR this am) Restrictions Weight Bearing Restrictions: No LLE Weight Bearing: Weight bearing as tolerated Mobility (including Balance) Bed Mobility Supine to Sit: 4: Min assist Transfers Sit to Stand: 4: Min assist;From elevated surface;With upper extremity assist;From bed Sit to Stand Details (indicate cue type and reason): cues for use of UEs Stand to Sit: 4: Min assist;To chair/3-in-1;With armrests;With upper extremity assist Stand to Sit Details: cues for Le position Ambulation/Gait Ambulation/Gait Assistance: 4: Min assist;5: Supervision Ambulation/Gait Assistance Details (indicate cue type and reason): cues for position from RW Ambulation Distance (Feet): 180 Feet Assistive  device: Rolling walker Gait Pattern: Step-to pattern    Exercise  Total Joint Exercises Ankle Circles/Pumps: AROM;20 reps;Supine;Both Quad Sets: AROM;20 reps;Supine;Both Heel Slides: AAROM;20 reps;Supine;Left Straight Leg Raises: AAROM;AROM;Left;20 reps;Supine End of Session PT - End of Session Equipment Utilized During Treatment: Gait belt Activity Tolerance: Patient tolerated treatment well Patient left: in chair;with call bell in reach Nurse Communication: Mobility status for transfers;Mobility status for ambulation General Behavior During Session: [facility] for tasks performed Cognition: [facility] for tasks performed  Taviana Westergren 07/29/2011, 1:20 PM

## 2011-07-29 NOTE — Progress Notes (Signed)
  CARE MANAGEMENT NOTE 07/29/2011  Patient:  Kathy Clark, Kathy Clark   Account Number:  [id number]  Date Initiated:  07/28/2011  Documentation initiated by:  Colleen Can  Subjective/Objective Assessment:   dx osteoarthritis left knee; total knee replacemnt     Action/Plan:   Cm spoke with patient regarding discharge plans.  Patient plans to return to her home in [place], [place] where spouse will be caregiver . She is requesting [facility] for hh services   Anticipated DC Date:  07/30/2011   Anticipated DC Plan:  HOME W HOME HEALTH SERVICES  In-house referral  NA      DC Planning Services  CM consult      [facility] Choice  HOME HEALTH  DURABLE MEDICAL EQUIPMENT   Choice offered to / List presented to:  C-1 Patient   DME arranged  3-N-1  Levan Hurst      DME agency  [facility]     HH arranged  HH-2 PT      [facility] agency  [facility]   Status of service:  Completed, signed off Medicare Important Message given?  NA - LOS <3 / Initial given by admissions (If response is "NO", the following Medicare IM given date fields will be blank) Date Medicare IM given:   Date Additional Medicare IM given:    Discharge Disposition:    Per UR Regulation:    Comments:  01/16?2013 Raynelle Bring BSN CCM [phone] Pt will require 3n1 and RW. [facility] rep notified and will deliver DME to pt's room. [facility] will provide services for HHpt with start date of day after discharge.  07/28/2011 Raynelle Bring BSN CCM [phone] Copy of HH agencies for choice placed in shadow chart.

## 2011-07-29 NOTE — Progress Notes (Signed)
Physical Therapy Treatment Patient Details Name: [name] MRN: [id number] DOB: [birth year] Today's Date: 07/29/2011 1353 - 1424; GT, TA PT Assessment/Plan  PT - Assessment/Plan PT Plan: Discharge plan remains appropriate PT Frequency: 7X/week Recommendations for Other Services: OT consult Follow Up Recommendations: Home health PT Equipment Recommended: 3 in 1 bedside comode PT Goals  Acute Rehab PT Goals PT Goal Formulation: With patient Time For Goal Achievement: 7 days Pt will go Supine/Side to Sit: with supervision PT Goal: Supine/Side to Sit - Progress: Progressing toward goal Pt will go Sit to Supine/Side: with supervision PT Goal: Sit to Supine/Side - Progress: Progressing toward goal Pt will go Sit to Stand: with supervision PT Goal: Sit to Stand - Progress: Progressing toward goal Pt will go Stand to Sit: with supervision PT Goal: Stand to Sit - Progress: Progressing toward goal Pt will Ambulate: 51 - 150 feet;with supervision;with rolling walker PT Goal: Ambulate - Progress: Progressing toward goal Pt will Go Up / Down Stairs: Flight;with min assist;with least restrictive assistive device PT Goal: Up/Down Stairs - Progress: Progressing toward goal  PT Treatment Precautions/Restrictions  Precautions Precautions: Knee Required Braces or Orthoses: Yes Knee Immobilizer: Discontinue once straight leg raise with < 10 degree lag (did IND SLR) Restrictions Weight Bearing Restrictions: No LLE Weight Bearing: Weight bearing as tolerated Mobility (including Balance) Bed Mobility Bed Mobility: Yes Supine to Sit: 4: Min assist;5: Supervision Supine to Sit Details (indicate cue type and reason): min cues for L LE management Sit to Supine: 5: Supervision;HOB flat Transfers Sit to Stand: 5: Supervision;With upper extremity assist;From chair/3-in-1 Stand to Sit: 5: Supervision;With upper extremity assist;To bed;To chair/3-in-1;With  armrests Ambulation/Gait Ambulation/Gait Assistance: 5: Supervision;4: Min assist Ambulation/Gait Assistance Details (indicate cue type and reason): cues for position from RW Ambulation Distance (Feet): 200 Feet Assistive device: Rolling walker Gait Pattern: Step-to pattern Stairs: Yes Stairs Assistance: 4: Min assist Stairs Assistance Details (indicate cue type and reason): cues for sequence and foot/RW/crutch placement Stair Management Technique: One rail Right;Forwards;With crutches;Step to pattern (up/down 4 stairs with RW, bkwd with min assist) Number of Stairs: 4  (x2)    Exercise    End of Session PT - End of Session Equipment Utilized During Treatment: Gait belt Activity Tolerance: Patient tolerated treatment well Patient left: in chair;with call bell in reach Nurse Communication: Mobility status for transfers;Mobility status for ambulation General Behavior During Session: [facility] for tasks performed Cognition: [facility] for tasks performed  Kathy Clark 07/29/2011, 3:48 PM

## 2011-07-30 DIAGNOSIS — D62 Acute posthemorrhagic anemia: Secondary | ICD-10-CM | POA: Diagnosis not present

## 2011-07-30 DIAGNOSIS — E871 Hypo-osmolality and hyponatremia: Secondary | ICD-10-CM | POA: Diagnosis not present

## 2011-07-30 LAB — BASIC METABOLIC PANEL
BUN: 7 mg/dL (ref 6–23)
CO2: 27 mEq/L (ref 19–32)
Calcium: 8.4 mg/dL (ref 8.4–10.5)
Chloride: 100 mEq/L (ref 96–112)
Creatinine, Ser: 0.53 mg/dL (ref 0.50–1.10)
GFR calc Af Amer: >90 mL/min (ref >90)
GFR calc non Af Amer: >90 mL/min (ref >90)
Glucose, Bld: 109 mg/dL — ABNORMAL HIGH (ref 70–99)
Potassium: 3.6 mEq/L (ref 3.5–5.1)
Sodium: 135 mEq/L (ref 135–145)

## 2011-07-30 LAB — CBC
HCT: 28.6 % — ABNORMAL LOW (ref 36.0–46.0)
Hemoglobin: 9.8 g/dL — ABNORMAL LOW (ref 12.0–15.0)
MCH: 30 pg (ref 26.0–34.0)
MCHC: 34.3 g/dL (ref 30.0–36.0)
MCV: 87.5 fL (ref 78.0–100.0)
Platelets: 238 10*3/uL (ref 150–400)
RBC: 3.27 MIL/uL — ABNORMAL LOW (ref 3.87–5.11)
RDW: 12.5 % (ref 11.5–15.5)
WBC: 7.7 10*3/uL (ref 4.0–10.5)

## 2011-07-30 MED ORDER — OXYCODONE HCL 5 MG PO TABS: 5.0000 mg | ORAL_TABLET | ORAL | Status: AC | PRN

## 2011-07-30 MED ORDER — RIVAROXABAN 10 MG PO TABS: 10.0000 mg | ORAL_TABLET | Freq: Every day | ORAL | Status: DC

## 2011-07-30 MED ORDER — METHOCARBAMOL 500 MG PO TABS: 500.0000 mg | ORAL_TABLET | Freq: Four times a day (QID) | ORAL | Status: AC | PRN

## 2011-07-30 NOTE — Progress Notes (Signed)
Physical Therapy Treatment Patient Details Name: Kathy Clark MRN: [id number] DOB: [birth year] Today's Date: 07/30/2011 7829 - 5621; 2TE PT Assessment/Plan  PT - Assessment/Plan Comments on Treatment Session: OOB deferred pending arrival of caregiver for family ed PT Plan: Discharge plan remains appropriate PT Frequency: 7X/week Follow Up Recommendations: Home health PT Equipment Recommended: Rolling walker with 5" wheels;3 in 1 bedside comode PT Goals  Acute Rehab PT Goals Time For Goal Achievement: 7 days  PT Treatment Precautions/Restrictions  Precautions Precautions: Knee Required Braces or Orthoses: Yes Knee Immobilizer: Discontinue once straight leg raise with < 10 degree lag (did IND SLR) Restrictions Weight Bearing Restrictions: No LLE Weight Bearing: Weight bearing as tolerated Mobility (including Balance)      Exercise  Total Joint Exercises Ankle Circles/Pumps: AROM;20 reps;Supine;Both Quad Sets: AROM;20 reps;Supine;Both Short Arc Quad: AAROM;AROM;5 reps;10 reps;Both;Supine Heel Slides: AAROM;20 reps;Supine;Left Straight Leg Raises: AAROM;AROM;Left;20 reps;Supine End of Session PT - End of Session Activity Tolerance: Patient tolerated treatment well Patient left: with call bell in reach;in bed General Behavior During Session: [facility] for tasks performed Cognition: [facility] for tasks performed  Kathy Clark 07/30/2011, 10:02 AM

## 2011-07-30 NOTE — Progress Notes (Signed)
07/30/2011 Raynelle Bring SN CCM [phone] All of DME has been delivered to patient's room. [facility] notified of patient's discharge.

## 2011-07-30 NOTE — Progress Notes (Signed)
d/c instructions given and explained to pt.  Pt stable and ready for d/c. Prescriptions for xarelto, oxycodone, and robaxin given to pt, placed in pt's hands.  Dressing supplies given to pt.  Pt left with husband via car to home at this time.

## 2011-07-30 NOTE — Progress Notes (Signed)
Subjective: 3 Days Post-Op Procedure(s) (LRB): TOTAL KNEE ARTHROPLASTY (Left) Patient reports pain as controlled.   Patient seen in rounds by Dr. Lequita Halt. Patient is doing well.  Ready to go home.  Objective: Vital signs in last 24 hours: Temp:  [98.5 F (36.9 C)-99.3 F (37.4 C)] 98.9 F (37.2 C) (01/17 0514) Pulse Rate:  [101-107] 101  (01/17 0514) Resp:  [16-18] 16  (01/17 0514) BP: (116-119)/(76-81) 116/76 mmHg (01/17 0514) SpO2:  [96 %-100 %] 96 % (01/17 0514)  Intake/Output from previous day:  Intake/Output Summary (Last 24 hours) at 07/30/11 0928 Last data filed at 07/29/11 1855  Gross per 24 hour  Intake    610 ml  Output   2835 ml  Net  -2225 ml    Intake/Output this shift:    Labs: Results for orders placed during the hospital encounter of 07/27/11  TYPE AND SCREEN      Component Value Range   ABO/RH(D) A POS     Antibody Screen NEG     Sample Expiration 07/30/2011    ABO/RH      Component Value Range   ABO/RH(D) A POS    CBC      Component Value Range   WBC 11.6 (*) 4.0 - 10.5 (K/uL)   RBC 3.91  3.87 - 5.11 (MIL/uL)   Hemoglobin 11.7 (*) 12.0 - 15.0 (g/dL)   HCT 16.1 (*) 09.6 - 46.0 (%)   MCV 88.0  78.0 - 100.0 (fL)   MCH 29.9  26.0 - 34.0 (pg)   MCHC 34.0  30.0 - 36.0 (g/dL)   RDW 04.5  40.9 - 81.1 (%)   Platelets 240  150 - 400 (K/uL)  BASIC METABOLIC PANEL      Component Value Range   Sodium 138  135 - 145 (mEq/L)   Potassium 3.6  3.5 - 5.1 (mEq/L)   Chloride 103  96 - 112 (mEq/L)   CO2 26  19 - 32 (mEq/L)   Glucose, Bld 125 (*) 70 - 99 (mg/dL)   BUN 7  6 - 23 (mg/dL)   Creatinine, Ser 9.14  0.50 - 1.10 (mg/dL)   Calcium 8.3 (*) 8.4 - 10.5 (mg/dL)   GFR calc non Af Amer >90  >90 (mL/min)   GFR calc Af Amer >90  >90 (mL/min)  CBC      Component Value Range   WBC 8.4  4.0 - 10.5 (K/uL)   RBC 3.59 (*) 3.87 - 5.11 (MIL/uL)   Hemoglobin 10.7 (*) 12.0 - 15.0 (g/dL)   HCT 78.2 (*) 95.6 - 46.0 (%)   MCV 87.7  78.0 - 100.0 (fL)   MCH 29.8   26.0 - 34.0 (pg)   MCHC 34.0  30.0 - 36.0 (g/dL)   RDW 21.3  08.6 - 57.8 (%)   Platelets 234  150 - 400 (K/uL)  BASIC METABOLIC PANEL      Component Value Range   Sodium 134 (*) 135 - 145 (mEq/L)   Potassium 3.7  3.5 - 5.1 (mEq/L)   Chloride 99  96 - 112 (mEq/L)   CO2 27  19 - 32 (mEq/L)   Glucose, Bld 114 (*) 70 - 99 (mg/dL)   BUN 6  6 - 23 (mg/dL)   Creatinine, Ser 4.69  0.50 - 1.10 (mg/dL)   Calcium 8.5  8.4 - 62.9 (mg/dL)   GFR calc non Af Amer >90  >90 (mL/min)   GFR calc Af Amer >90  >90 (mL/min)  CBC  Component Value Range   WBC 7.7  4.0 - 10.5 (K/uL)   RBC 3.27 (*) 3.87 - 5.11 (MIL/uL)   Hemoglobin 9.8 (*) 12.0 - 15.0 (g/dL)   HCT 16.1 (*) 09.6 - 46.0 (%)   MCV 87.5  78.0 - 100.0 (fL)   MCH 30.0  26.0 - 34.0 (pg)   MCHC 34.3  30.0 - 36.0 (g/dL)   RDW 04.5  40.9 - 81.1 (%)   Platelets 238  150 - 400 (K/uL)  BASIC METABOLIC PANEL      Component Value Range   Sodium 135  135 - 145 (mEq/L)   Potassium 3.6  3.5 - 5.1 (mEq/L)   Chloride 100  96 - 112 (mEq/L)   CO2 27  19 - 32 (mEq/L)   Glucose, Bld 109 (*) 70 - 99 (mg/dL)   BUN 7  6 - 23 (mg/dL)   Creatinine, Ser 9.14  0.50 - 1.10 (mg/dL)   Calcium 8.4  8.4 - 78.2 (mg/dL)   GFR calc non Af Amer >90  >90 (mL/min)   GFR calc Af Amer >90  >90 (mL/min)    Exam: Neurovascular intact Sensation intact distally Incision - clean, dry, no drainage Motor function intact - moving foot and toes well on exam.   Assessment/Plan: 3 Days Post-Op Procedure(s) (LRB): TOTAL KNEE ARTHROPLASTY (Left) Up with therapy Discharge home with home health Procedure(s) (LRB): TOTAL KNEE ARTHROPLASTY (Left) Past Medical History  Diagnosis Date  . Lymphocytic colitis     microscopic  . Migraines   . Essential hypertension, benign   . Osteoarthritis   . 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

## 2011-07-30 NOTE — Progress Notes (Signed)
Physical Therapy Treatment Patient Details Name: Kathy Clark MRN: [id number] DOB: 22-Sep-1960 Today's Date: 07/30/2011 1100 - 1124; GT, TA PT Assessment/Plan  PT - Assessment/Plan Comments on Treatment Session: OOB deferred pending arrival of caregiver for family ed PT Plan: Discharge plan remains appropriate PT Frequency: 7X/week Recommendations for Other Services: OT consult Follow Up Recommendations: Home health PT Equipment Recommended: Rolling walker with 5" wheels;3 in 1 bedside comode PT Goals  Acute Rehab PT Goals PT Goal Formulation: With patient Time For Goal Achievement: 7 days Pt will go Supine/Side to Sit: with supervision PT Goal: Supine/Side to Sit - Progress: Progressing toward goal Pt will go Sit to Supine/Side: with supervision PT Goal: Sit to Supine/Side - Progress: Progressing toward goal Pt will go Sit to Stand: with supervision PT Goal: Sit to Stand - Progress: Met Pt will go Stand to Sit: with supervision PT Goal: Stand to Sit - Progress: Met Pt will Ambulate: 51 - 150 feet;with supervision;with rolling walker PT Goal: Ambulate - Progress: Met Pt will Go Up / Down Stairs: Flight;with min assist;with least restrictive assistive device PT Goal: Up/Down Stairs - Progress: Met  PT Treatment Precautions/Restrictions  Precautions Precautions: Knee Required Braces or Orthoses: Yes Knee Immobilizer: Discontinue once straight leg raise with < 10 degree lag (did IND SLR) Restrictions Weight Bearing Restrictions: No LLE Weight Bearing: Weight bearing as tolerated Mobility (including Balance) Bed Mobility Supine to Sit: 5: Supervision Sit to Supine: 5: Supervision Transfers Sit to Stand: 5: Supervision;With upper extremity assist;From chair/3-in-1 Stand to Sit: 5: Supervision;With upper extremity assist;To bed;To chair/3-in-1;With armrests Stand to Sit Details: min assist for L LE Ambulation/Gait Ambulation/Gait Assistance: 5: Supervision Ambulation/Gait  Assistance Details (indicate cue type and reason): cues for position from RW Ambulation Distance (Feet): 140 Feet Assistive device: Rolling walker Gait Pattern: Step-to pattern Stairs Assistance: 4: Min assist Stairs Assistance Details (indicate cue type and reason): cues for posture, sequence and position from RW Stair Management Technique: Backwards;With walker Number of Stairs: 4     Exercise  Total Joint Exercises Ankle Circles/Pumps: AROM;20 reps;Supine;Both Quad Sets: AROM;20 reps;Supine;Both Short Arc Quad: AAROM;AROM;5 reps;10 reps;Both;Supine Heel Slides: AAROM;20 reps;Supine;Left Straight Leg Raises: AAROM;AROM;Left;20 reps;Supine End of Session PT - End of Session Equipment Utilized During Treatment: Gait belt Activity Tolerance: Patient tolerated treatment well Patient left: with call bell in reach;in bed Nurse Communication: Mobility status for transfers;Mobility status for ambulation General Behavior During Session: [facility] for tasks performed Cognition: [facility] for tasks performed  Rayaan Lorah 07/30/2011, 11:55 AM

## 2011-08-03 NOTE — Discharge Summary (Signed)
Physician Discharge Summary   Patient ID: Kathy Clark MRN: [id number] DOB/AGE: 09-12-1960 51 y.o.  Admit date: 07/27/2011 Discharge date: 07/30/2011  Primary Diagnosis: Osteoarthritis Left Knee  Admission Diagnoses: Past Medical History  Diagnosis Date  . Lymphocytic colitis     microscopic  . Migraines   . Essential hypertension, benign   . Osteoarthritis   . 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 year old female with end stage OA of her left knee with progressively worsening pain and dysfunction. She has constant pain, with activity and at rest and significant functional deficits with difficulties even with ADLs. She has had extensive non-op management including analgesics, injections of cortisone and viscosupplements, and home exercise program, but remains in significant pain with significant dysfunction. Radiographs show bone on bone arthritis. She presents now for left Total Knee Arthroplasty.   Laboratory Data: Hospital Outpatient Visit on 07/20/2011  Component Date Value Range Status  . aPTT (seconds) 07/20/2011 30  24-37 Final  . WBC (K/uL) 07/20/2011 5.0  4.0-10.5 Final  . RBC (MIL/uL) 07/20/2011 4.79  3.87-5.11 Final  . Hemoglobin (g/dL) 40/98/1191 47.8  29.5-62.1 Final  . HCT (%) 07/20/2011 41.9  36.0-46.0 Final  . MCV (fL) 07/20/2011 87.5  78.0-100.0 Final  . MCH (pg) 07/20/2011 30.1  26.0-34.0 Final  . MCHC (g/dL) 30/86/5784 69.6  29.5-28.4 Final  . RDW (%) 07/20/2011 12.8  11.5-15.5 Final  . Platelets (K/uL) 07/20/2011 293  150-400 Final  . Sodium (mEq/L) 07/20/2011 139  135-145 Final  . Potassium (mEq/L) 07/20/2011 3.6  3.5-5.1 Final  . Chloride (mEq/L) 07/20/2011 101  96-112 Final  . CO2 (mEq/L) 07/20/2011 29   19-32 Final  . Glucose, Bld (mg/dL) 13/24/4010 81  27-25 Final  . BUN (mg/dL) 36/64/4034 12  7-42 Final  . Creatinine, Ser (mg/dL) 59/56/3875 6.43  3.29-5.18 Final  . Calcium (mg/dL) 84/16/6063 9.7  0.1-60.1 Final  . Total Protein (g/dL) 09/32/3557 7.3  3.2-2.0 Final  . Albumin (g/dL) 25/42/7062 4.0  3.7-6.2 Final  . AST (U/L) 07/20/2011 16  0-37 Final  . ALT (U/L) 07/20/2011 24  0-35 Final  . Alkaline Phosphatase (U/L) 07/20/2011 54  39-117 Final  . Total Bilirubin (mg/dL) 83/15/1761 0.4  6.0-7.3 Final  . GFR calc non Af Amer (mL/min) 07/20/2011 >90  >90 Final  . GFR calc Af Amer (mL/min) 07/20/2011 >90  >90 Final   Comment:                                 The eGFR has been calculated                          using the CKD EPI equation.                          This calculation has not been                          validated in all clinical  situations.                          eGFR's persistently                          <90 mL/min signify                          possible Chronic Kidney Disease.  [name] Prothrombin Time (seconds) 07/20/2011 12.3  11.6-15.2 Final  . INR  07/20/2011 0.90  0.00-1.49 Final  . Color, Urine  07/20/2011 YELLOW  YELLOW Final  . APPearance  07/20/2011 CLEAR  CLEAR Final  . Specific Gravity, Urine  07/20/2011 1.009  1.005-1.030 Final  . pH  07/20/2011 7.0  5.0-8.0 Final  . Glucose, UA (mg/dL) 09/81/1914 NEGATIVE  NEGATIVE Final  . Hgb urine dipstick  07/20/2011 NEGATIVE  NEGATIVE Final  . Bilirubin Urine  07/20/2011 NEGATIVE  NEGATIVE Final  . Ketones, ur (mg/dL) 78/29/5621 NEGATIVE  NEGATIVE Final  . Protein, ur (mg/dL) 30/86/5784 NEGATIVE  NEGATIVE Final  . Urobilinogen, UA (mg/dL) 69/62/9528 0.2  4.1-3.2 Final  . Nitrite  07/20/2011 NEGATIVE  NEGATIVE Final  . Leukocytes, UA  07/20/2011 NEGATIVE  NEGATIVE Final   MICROSCOPIC NOT DONE ON URINES WITH NEGATIVE PROTEIN, BLOOD, LEUKOCYTES, NITRITE, OR GLUCOSE <1000 mg/dL.  [name] MRSA, PCR   07/20/2011 NEGATIVE  NEGATIVE Final  . Staphylococcus aureus  07/20/2011 NEGATIVE  NEGATIVE Final   Comment:                                 The Xpert SA Assay (FDA                          approved for NASAL specimens                          only), is one component of                          a comprehensive surveillance                          program.  It is not intended                          to diagnose infection nor to                          guide or monitor treatment.   No results found for this basename: HGB:5 in the last 72 hours No results found for this basename: WBC:2,RBC:2,HCT:2,PLT:2 in the last 72 hours No results found for this basename: NA:2,K:2,CL:2,CO2:2,BUN:2,CREATININE:2,GLUCOSE:2,CALCIUM:2 in the last 72 hours No results found for this basename: LABPT:2,INR:2 in the last 72 hours  X-Rays:Dg Chest 2 View  07/20/2011  *RADIOLOGY REPORT*  Clinical Data: Preoperative respiratory exam for knee arthroplasty.  CHEST - 2 VIEW  Comparison: 12/06/2008  Findings: Heart size is normal.  Mediastinal shadows are normal. The lungs are clear.  No effusions.  Ordinary degenerative changes effect the spine.  IMPRESSION: No active disease  Original Report Authenticated By: Thomasenia Sales, M.D.    EKG: Orders placed  during the hospital encounter of 07/27/11  . EKG     Hospital Course: Patient was admitted to [facility] and taken to the OR and underwent the above state procedure without complications.  Patient tolerated the procedure well and was later transferred to the recovery room and then to the orthopaedic floor for postoperative care.  They were given PO and IV analgesics for pain control following their surgery.  They were given 24 hours of postoperative antibiotics and started on DVT prophylaxis.   PT and OT were ordered for total joint protocol.  Discharge planning consulted to help with postop disposition and equipment needs.  Patient had a rough night on the  evening of surgery with pain but started to get up with therapy on day one.  PCA Morphine was discontinued and they were weaned over to PO meds.  Hemovac drain was pulled without difficulty.  Continued to progress with therapy into day two and walked very well.  Dressing was changed on day two and the incision was healing well.  By day three, the patient had progressed with therapy and meeting goals.  Incision was healing well.  Patient was seen in rounds and was ready to go home.  Discharge Medications: Prior to Admission medications   Medication Sig Start Date End Date Taking? Authorizing Provider  gabapentin (NEURONTIN) 300 MG capsule Take 300 mg by mouth 2 (two) times daily.    Yes Historical Provider, MD  hyoscyamine (LEVSIN SL) 0.125 MG SL tablet Place 1 tablet (0.125 mg total) under the tongue every 4 (four) hours as needed. Bladder spasm  07/15/11  Yes Hart Carwin, MD  lisinopril-hydrochlorothiazide (PRINZIDE,ZESTORETIC) 20-25 MG per tablet Take 1 tablet by mouth daily.  06/09/11  Yes Nani Gasser, MD  methocarbamol (ROBAXIN) 500 MG tablet Take 1 tablet (500 mg total) by mouth every 6 (six) hours as needed. 07/30/11 08/09/11  Alexzandrew Perkins, PA  oxyCODONE (OXY IR/ROXICODONE) 5 MG immediate release tablet Take 1-2 tablets (5-10 mg total) by mouth every 3 (three) hours as needed. 07/30/11 08/09/11  Alexzandrew Julien Girt, PA  rivaroxaban (XARELTO) 10 MG TABS tablet Take 1 tablet (10 mg total) by mouth daily with breakfast. 07/30/11   Alexzandrew Julien Girt, PA  SUMAtriptan (IMITREX) 20 MG/ACT nasal spray Place 1 spray into the nose every 2 (two) hours as needed. HEADACHE     Historical Provider, MD  zolpidem (AMBIEN CR) 12.5 MG CR tablet Take 12.5 mg by mouth at bedtime as needed. SLEEP     Historical Provider, MD    Diet: heart healthy  Activity:WBAT  Follow-up:in 2 weeks  Disposition: Home Discharged Condition: Good   Discharge Orders    Future Orders Please Complete By Expires     Diet - low sodium heart healthy      Call MD / Call 911      Comments:   If you experience chest pain or shortness of breath, CALL 911 and be transported to the hospital emergency room.  If you develope a fever above 101 F, pus (white drainage) or increased drainage or redness at the wound, or calf pain, call your surgeon's office.   Constipation Prevention      Comments:   Drink plenty of fluids.  Prune juice may be helpful.  You may use a stool softener, such as Colace (over the counter) 100 mg twice a day.  Use MiraLax (over the counter) for constipation as needed.   Increase activity slowly as tolerated  Weight Bearing as taught in Physical Therapy      Comments:   Use a walker or crutches as instructed.   Discharge instructions      Comments:   Pick up stool softner and laxative for home. Do not submerge incision under water. May shower. Continue to use ice for pain and swelling from surgery.    Driving restrictions      Comments:   No driving   Lifting restrictions      Comments:   No lifting   TED hose      Comments:   Use stockings (TED hose) for 3 weeks on both leg(s).  You may remove them at night for sleeping.   Change dressing      Comments:   Change dressing daily with sterile 4 x 4 inch gauze dressing and apply TED hose.   Do not put a pillow under the knee. Place it under the heel.        Discharge Medication List as of 07/30/2011  1:20 PM    START taking these medications   Details  methocarbamol (ROBAXIN) 500 MG tablet Take 1 tablet (500 mg total) by mouth every 6 (six) hours as needed., Starting 07/30/2011, Until Sun 08/09/11, Print    oxyCODONE (OXY IR/ROXICODONE) 5 MG immediate release tablet Take 1-2 tablets (5-10 mg total) by mouth every 3 (three) hours as needed., Starting 07/30/2011, Until Sun 08/09/11, Print    rivaroxaban (XARELTO) 10 MG TABS tablet Take 1 tablet (10 mg total) by mouth daily with breakfast., Starting 07/30/2011, Until Discontinued,  Print      CONTINUE these medications which have NOT CHANGED   Details  gabapentin (NEURONTIN) 300 MG capsule Take 300 mg by mouth 2 (two) times daily. , Until Discontinued, Historical Med    hyoscyamine (LEVSIN SL) 0.125 MG SL tablet Place 1 tablet (0.125 mg total) under the tongue every 4 (four) hours as needed. Bladder spasm , Starting 07/15/2011, Until Discontinued, Print    lisinopril-hydrochlorothiazide (PRINZIDE,ZESTORETIC) 20-25 MG per tablet Take 1 tablet by mouth daily. , Starting 06/09/2011, Until Discontinued, Historical Med    SUMAtriptan (IMITREX) 20 MG/ACT nasal spray Place 1 spray into the nose every 2 (two) hours as needed. HEADACHE , Until Discontinued, Historical Med    zolpidem (AMBIEN CR) 12.5 MG CR tablet Take 12.5 mg by mouth at bedtime as needed. SLEEP , Until Discontinued, Historical Med      STOP taking these medications     Calcium 500-125 MG-UNIT TABS      estradiol (VIVELLE-DOT) 0.05 MG/24HR      fish oil-omega-3 fatty acids 1000 MG capsule      Glucosamine-Chondroit-Vit C-Mn (GLUCOSAMINE 1500 COMPLEX) CAPS      Melatonin 3 MG TABS      Multiple Vitamin (MULTIVITAMIN) capsule          Signed: PERKINS, ALEXZANDREW 08/03/2011, 9:33 AM

## 2011-08-10 ENCOUNTER — Encounter: Payer: Self-pay | Admitting: *Deleted

## 2011-08-25 ENCOUNTER — Ambulatory Visit: Payer: 59 | Attending: Orthopedic Surgery | Admitting: Physical Therapy

## 2011-08-25 DIAGNOSIS — Z96659 Presence of unspecified artificial knee joint: Secondary | ICD-10-CM | POA: Insufficient documentation

## 2011-08-25 DIAGNOSIS — M25669 Stiffness of unspecified knee, not elsewhere classified: Secondary | ICD-10-CM | POA: Insufficient documentation

## 2011-08-25 DIAGNOSIS — M25569 Pain in unspecified knee: Secondary | ICD-10-CM | POA: Insufficient documentation

## 2011-08-25 DIAGNOSIS — M6281 Muscle weakness (generalized): Secondary | ICD-10-CM | POA: Insufficient documentation

## 2011-08-25 DIAGNOSIS — IMO0001 Reserved for inherently not codable concepts without codable children: Secondary | ICD-10-CM | POA: Insufficient documentation

## 2011-08-26 ENCOUNTER — Ambulatory Visit: Payer: 59 | Admitting: Physical Therapy

## 2011-08-28 ENCOUNTER — Ambulatory Visit: Payer: 59 | Admitting: Physical Therapy

## 2011-08-31 ENCOUNTER — Ambulatory Visit: Payer: 59 | Admitting: Physical Therapy

## 2011-09-03 ENCOUNTER — Ambulatory Visit: Payer: 59 | Admitting: Physical Therapy

## 2011-09-09 ENCOUNTER — Ambulatory Visit: Payer: 59 | Admitting: Physical Therapy

## 2011-09-11 ENCOUNTER — Ambulatory Visit: Payer: 59 | Attending: Orthopedic Surgery | Admitting: Physical Therapy

## 2011-09-11 DIAGNOSIS — M6281 Muscle weakness (generalized): Secondary | ICD-10-CM | POA: Insufficient documentation

## 2011-09-11 DIAGNOSIS — IMO0001 Reserved for inherently not codable concepts without codable children: Secondary | ICD-10-CM | POA: Insufficient documentation

## 2011-09-11 DIAGNOSIS — M25669 Stiffness of unspecified knee, not elsewhere classified: Secondary | ICD-10-CM | POA: Insufficient documentation

## 2011-09-11 DIAGNOSIS — Z96659 Presence of unspecified artificial knee joint: Secondary | ICD-10-CM | POA: Insufficient documentation

## 2011-09-11 DIAGNOSIS — M25569 Pain in unspecified knee: Secondary | ICD-10-CM | POA: Insufficient documentation

## 2011-09-15 ENCOUNTER — Ambulatory Visit: Payer: 59 | Admitting: Physical Therapy

## 2011-09-16 ENCOUNTER — Telehealth: Payer: Self-pay | Admitting: *Deleted

## 2011-09-16 MED ORDER — PREDNISONE 20 MG PO TABS: ORAL_TABLET | ORAL | Status: DC

## 2011-09-16 NOTE — Telephone Encounter (Signed)
Pt in PT for post surgery knee replacement and pulled her back out. She is already on muscle relaxer but this isn't helping much. Can not do PT with her back feeling like it is. Pt has pain med as well and was wondering if could get a steroid pack to help it- has had this before and worked well. She is 6 weeks post op

## 2011-09-16 NOTE — Telephone Encounter (Signed)
rx sent for steroids.  Hope she feels better.

## 2011-09-17 ENCOUNTER — Encounter: Payer: 59 | Admitting: Physical Therapy

## 2011-09-17 ENCOUNTER — Other Ambulatory Visit: Payer: Self-pay | Admitting: Family Medicine

## 2011-09-22 ENCOUNTER — Ambulatory Visit: Payer: 59 | Admitting: Physical Therapy

## 2011-09-24 ENCOUNTER — Encounter: Payer: 59 | Admitting: Physical Therapy

## 2011-09-24 ENCOUNTER — Ambulatory Visit: Payer: 59 | Admitting: Physical Therapy

## 2011-09-29 ENCOUNTER — Ambulatory Visit: Payer: 59 | Admitting: Physical Therapy

## 2011-10-01 ENCOUNTER — Ambulatory Visit: Payer: 59 | Admitting: Physical Therapy

## 2011-10-05 ENCOUNTER — Telehealth: Payer: Self-pay | Admitting: Internal Medicine

## 2011-10-05 ENCOUNTER — Encounter: Payer: Self-pay | Admitting: Nurse Practitioner

## 2011-10-05 ENCOUNTER — Ambulatory Visit (INDEPENDENT_AMBULATORY_CARE_PROVIDER_SITE_OTHER): Payer: 59 | Admitting: Nurse Practitioner

## 2011-10-05 VITALS — BP 104/60 | HR 72 | Ht 68.0 in | Wt 218.0 lb

## 2011-10-05 DIAGNOSIS — R195 Other fecal abnormalities: Secondary | ICD-10-CM | POA: Insufficient documentation

## 2011-10-05 DIAGNOSIS — R1013 Epigastric pain: Secondary | ICD-10-CM

## 2011-10-05 DIAGNOSIS — R110 Nausea: Secondary | ICD-10-CM

## 2011-10-05 DIAGNOSIS — K529 Noninfective gastroenteritis and colitis, unspecified: Secondary | ICD-10-CM

## 2011-10-05 DIAGNOSIS — K5289 Other specified noninfective gastroenteritis and colitis: Secondary | ICD-10-CM

## 2011-10-05 MED ORDER — PROMETHAZINE HCL 12.5 MG PO TABS: 12.5000 mg | ORAL_TABLET | ORAL | Status: DC

## 2011-10-05 MED ORDER — HYOSCYAMINE SULFATE 0.125 MG SL SUBL: SUBLINGUAL_TABLET | SUBLINGUAL | Status: DC

## 2011-10-05 NOTE — Telephone Encounter (Signed)
Patient states one week ago, she had diarrhea and headache. She states now her colitis is acting up. She has tried Levsin and long acting Levabid She is having multiple loose, stools. She denies any bleeding. Her lower stomach is burning. Denies vomiting but is having nausea off and on. She has tried the [employer] without improvement. Scheduled patient with Willette Cluster, NP today at 2:30PM.

## 2011-10-05 NOTE — Progress Notes (Signed)
Kathy Clark [id number] 03/27/1961   HISTORY OR PRESENT ILLNESS : This is a 51 year old white female with a history of lymphocytic colitis diagnosed April 2007. Except for an occasional episode of diarrhea, her symptoms resolved with Entocort and Levbid. Patient had a followup with Dr. Juanda Chance in January of this year at which time she was doing well. She is due for recall colonoscopy April 2017.    One week ago [place] developed a headache, nausea and loose stool. She went out of town with a friend who also developed some of these same symptoms though her friend felt better after  24 hours. Though her headache is gone patient is still nauseated, having loose stools and also complains of epigastric burning and excessive foul smelling flatus. On average she has 4-5 BMs loose stools a day. [name] helps but not so much lately.  No rectal bleeding. No fevers. She started the [employer] yesterday and her diarrhea has slowed down today. [name] had a knee replacement in January and she did have perioperative antibiotics.   Current Medications, Allergies, Past Medical History, Past Surgical History, Family History and Social History were reviewed in [employer] record.   PHYSICAL EXAMINATION : General:  Well developed  female in no acute distress Head: Normocephalic and atraumatic Eyes:  sclerae anicteric,conjunctive pink. Ears: Normal auditory acuity Neck: Supple, no masses.  Lungs: Clear throughout to auscultation Heart: Regular rate and rhythm; no murmurs heard Abdomen: Soft, nondistended, nontender. No masses or hepatomegaly noted. Normal bowel sounds Rectal: not done Musculoskeletal: Symmetrical with no gross deformities  Skin: No lesions on visible extremities Extremities: No edema or deformities noted Neurological: Oriented, grossly nonfocal Cervical Nodes:  No significant cervical adenopathy Psychological:  Alert and cooperative. Normal mood and  affect  ASSESSMENT AND PLAN :  1. Loose stools and increased intestinal gas, this sounds infectious in nature. Though it has been two months since patient had any antibiotics, still need to exclude C. difficile. Will check stool studies. Continue the BRAT diet for 3-4 days. Refill Levbid. We will call her with test results. See #2  2. History of lymphocytic colitis. Doubt this is the cause  of her acute bowel change but will definitely keep it in mind. If patient does not respond to above measures then will consider a short course of Entocort.   3. Epigastric / RUQ burning / nausea. She could have NSAID induced gastritis or PUD. Patient sees orthopedic surgeon this week and will ask him about whether baby aspirin, started following her knee replacement, can be discontinued. In the meantime I gave her a sample of Nexium to start before breakfast. She can use low-dose Phenergan as needed for nausea.

## 2011-10-05 NOTE — Patient Instructions (Addendum)
Please go to the basement level to have your labs drawn.  We sent a refill on the LEVBID for cramping and spasms and a prescription for Phenergan for nausea. We gave you samples of Nexium to try for 3 weeks.  Take 1 capsule 30 min before breakfast.  If you want to take the stool samples to [facility], [place], I have given you the lab orders to take to them.

## 2011-10-06 ENCOUNTER — Other Ambulatory Visit: Payer: Self-pay | Admitting: Nurse Practitioner

## 2011-10-06 ENCOUNTER — Ambulatory Visit: Payer: 59 | Admitting: Physical Therapy

## 2011-10-06 NOTE — Progress Notes (Signed)
Agree with excellent initial assessment and plans

## 2011-10-08 ENCOUNTER — Telehealth: Payer: Self-pay | Admitting: Nurse Practitioner

## 2011-10-08 ENCOUNTER — Ambulatory Visit: Payer: 59 | Attending: Orthopedic Surgery | Admitting: Physical Therapy

## 2011-10-08 DIAGNOSIS — M25669 Stiffness of unspecified knee, not elsewhere classified: Secondary | ICD-10-CM | POA: Insufficient documentation

## 2011-10-08 DIAGNOSIS — Z96659 Presence of unspecified artificial knee joint: Secondary | ICD-10-CM | POA: Insufficient documentation

## 2011-10-08 DIAGNOSIS — IMO0001 Reserved for inherently not codable concepts without codable children: Secondary | ICD-10-CM | POA: Insufficient documentation

## 2011-10-08 DIAGNOSIS — M25569 Pain in unspecified knee: Secondary | ICD-10-CM | POA: Insufficient documentation

## 2011-10-08 DIAGNOSIS — M6281 Muscle weakness (generalized): Secondary | ICD-10-CM | POA: Insufficient documentation

## 2011-10-08 NOTE — Telephone Encounter (Signed)
Patient calling with update. She is doing better. Still has some burning in stomach. She is taking her Nexium. Still having some loose stools but half the amount as before. Levbid seems to be helping.

## 2011-10-12 ENCOUNTER — Telehealth: Payer: Self-pay | Admitting: Nurse Practitioner

## 2011-10-12 NOTE — Telephone Encounter (Signed)
The pt is calling because she is having no relief with the levsin every 4 hours and nexium once daily.  She is still having frequent loose stools and burning and bloating every evening.  I advised that she could take a nexium at night as well and I will send a message to Willette Cluster to see if she wants to start entocort as mentioned in her last office note.  Pt is aware it may be tomorrow before we have and answer,  Pt agreed and thanked me for calling.

## 2011-10-13 ENCOUNTER — Telehealth: Payer: Self-pay | Admitting: Internal Medicine

## 2011-10-13 ENCOUNTER — Encounter: Payer: 59 | Admitting: Physical Therapy

## 2011-10-13 MED ORDER — BUDESONIDE 3 MG PO CP24: ORAL_CAPSULE | ORAL | Status: DC

## 2011-10-13 MED ORDER — SUCRALFATE 1 GM/10ML PO SUSP: 1.0000 g | Freq: Four times a day (QID) | ORAL | Status: DC

## 2011-10-13 NOTE — Telephone Encounter (Signed)
C. Diff is negative. Paula aware. Rx sent to pharmacy. Left a message for patient to call me.

## 2011-10-13 NOTE — Telephone Encounter (Signed)
Patty, I just sent a note to [facility]. Please coordinate this with [facility]. Trying to find her C-Diff results, everything else is normal. If C-Diff is negative and she is having numerous loose stools a day then we can start Entocort 3mg , 3 daily and get her back to clinic in about 2-3 weeks. We can add Carafate slurry QID for upper GI burning. Continue Nexium. If not better then she may need EGD. Thanks

## 2011-10-13 NOTE — Telephone Encounter (Signed)
Patient wanted to be sure she could still use Levsin. Told her this is okay.

## 2011-10-13 NOTE — Telephone Encounter (Signed)
Spoke with patient and gave her Willette Cluster, NP recommendations. Scheduled patient with Dr. Juanda Chance on 11/03/11 at 3:00 PM

## 2011-10-15 ENCOUNTER — Encounter: Payer: 59 | Admitting: Physical Therapy

## 2011-10-19 ENCOUNTER — Encounter: Payer: 59 | Admitting: Physical Therapy

## 2011-10-20 ENCOUNTER — Telehealth: Payer: Self-pay | Admitting: Nurse Practitioner

## 2011-10-20 MED ORDER — ESOMEPRAZOLE MAGNESIUM 40 MG PO CPDR: 40.0000 mg | DELAYED_RELEASE_CAPSULE | Freq: Every day | ORAL | Status: DC

## 2011-10-20 NOTE — Telephone Encounter (Signed)
Patient needs rx for Nexium. Rx sent to [facility].

## 2011-10-20 NOTE — Telephone Encounter (Signed)
Left a message for patient to call me. 

## 2011-10-21 ENCOUNTER — Encounter: Payer: Self-pay | Admitting: Nurse Practitioner

## 2011-10-22 ENCOUNTER — Encounter: Payer: 59 | Admitting: Physical Therapy

## 2011-10-23 ENCOUNTER — Other Ambulatory Visit: Payer: Self-pay | Admitting: Nurse Practitioner

## 2011-11-03 ENCOUNTER — Ambulatory Visit (INDEPENDENT_AMBULATORY_CARE_PROVIDER_SITE_OTHER): Payer: 59 | Admitting: Internal Medicine

## 2011-11-03 ENCOUNTER — Encounter: Payer: Self-pay | Admitting: Internal Medicine

## 2011-11-03 VITALS — BP 112/68 | HR 64 | Ht 68.0 in | Wt 214.0 lb

## 2011-11-03 DIAGNOSIS — K5289 Other specified noninfective gastroenteritis and colitis: Secondary | ICD-10-CM

## 2011-11-03 MED ORDER — SUCRALFATE 1 G PO TABS: 1.0000 g | ORAL_TABLET | Freq: Two times a day (BID) | ORAL | Status: DC

## 2011-11-03 MED ORDER — BUDESONIDE 3 MG PO CP24: ORAL_CAPSULE | ORAL | Status: DC

## 2011-11-03 MED ORDER — ESOMEPRAZOLE MAGNESIUM 40 MG PO CPDR: 40.0000 mg | DELAYED_RELEASE_CAPSULE | Freq: Every day | ORAL | Status: DC

## 2011-11-03 NOTE — Patient Instructions (Signed)
We have sent the following medications to your pharmacy for you to pick up at your convenience: Carafate Nexium Entocort (Please take Entocort 2 tablets by mouth once daily x 1 month, then decrease to 1 tablet by mouth once daily x 1 month, then discontinue.) Please follow up with Dr Juanda Chance in 2 months. CC: Dr Nani Gasser

## 2011-11-03 NOTE — Progress Notes (Signed)
Kathy Clark 1960-12-19 MRN [id number]   History of Present Illness:  This is a 51 year old white female with lymphocytic colitis diagnosed in April 2007 which was attributed to Aleve. She responded to Entocort initially and was in remission up until 2 months ago when she had a recurrence of diarrhea following a total knee replacement by Dr Despina Hick. Since she was on antibiotics postoperatively, she was checked for C. difficile toxin which was negative. Her stool lactoferrin was negative as well. She saw Midge Minium on 10/05/2011 who started her on Entocort 9 mg daily.  She is 80% improved. She was also started on Nexium and Carafate with marked improvement of her dyspepsia. There is no family history of gallbladder disease.   Past Medical History  Diagnosis Date  . Lymphocytic colitis     microscopic  . Migraines   . Essential hypertension, benign   . Osteoarthritis   . 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 Microdiscetomy 2005  . Abdominal hysterectomy 07-2005  . Knee surgery 2006    left; multiple  . Skin cancer excision     eyelid  . Back surgery   . Shoulder surgery 07-22-09    right  . Total knee arthroplasty 07/27/2011    Procedure: TOTAL KNEE ARTHROPLASTY;  Surgeon: Loanne Drilling;  Location: [facility];  Service: Orthopedics;  Laterality: Left;    reports that she has never smoked. She has never used smokeless tobacco. She reports that she drinks alcohol. She reports that she does not use illicit drugs. family history includes Breast cancer in her mother and unspecified family member; Heart disease in her father; and Tongue cancer in her mother.  There is no history of Colon cancer. Allergies  Allergen Reactions  . Succinylcholine Anaphylaxis  . Dilaudid (Hydromorphone Hcl) Nausea And Vomiting        Review of Systems: Denies heartburn. Dysphagia, and odynophagia  The remainder of the 10 point ROS  is negative except as outlined in H&P   Physical Exam: General appearance  Well developed, in no distress. Eyes- non icteric. HEENT nontraumatic, normocephalic. Mouth no lesions, tongue papillated, no cheilosis. Neck supple without adenopathy, thyroid not enlarged, no carotid bruits, no JVD. Lungs Clear to auscultation bilaterally. Cor normal S1, normal S2, regular rhythm, no murmur,  quiet precordium. Abdomen: Soft nontender abdomen with minimal tenderness in left lower quadrant. No distention. Normal active bowel sounds. Rectal: Not done Extremities no pedal edema. Well-healed surgical scar over the right knee Skin no lesions. Neurological alert and oriented x 3. Psychological normal mood and affect.  Assessment and Plan:  Problem #1 Lymphocytic colitis since 2007. She is now in remission on an Entocort taper. She will reduce the Entocort to 6 mg daily for 4 weeks and subsequently to 30 mg daily for 4 weeks before she discontinues this. She will continue on the Carafate 1 g twice a day and Nexium while she is taking her Entocort. These may be discontinued later. If there are any problems, I will see her in 8 weeks.   11/03/2011 Lina Sar

## 2011-11-18 ENCOUNTER — Other Ambulatory Visit: Payer: Self-pay

## 2011-11-18 ENCOUNTER — Other Ambulatory Visit: Payer: Self-pay | Admitting: Internal Medicine

## 2011-11-18 MED ORDER — HYOSCYAMINE SULFATE 0.125 MG SL SUBL: 0.1250 mg | SUBLINGUAL_TABLET | Freq: Three times a day (TID) | SUBLINGUAL | Status: DC | PRN

## 2011-11-18 MED ORDER — HYOSCYAMINE SULFATE CR 0.375 MG PO CP12: 0.3750 mg | ORAL_CAPSULE | Freq: Two times a day (BID) | ORAL | Status: DC | PRN

## 2011-11-18 NOTE — Telephone Encounter (Signed)
Pt called to let us know she needed the Levsin SR .375 instead of the Levsin .125 that was sent in.  Contacted Pharmacy and changed rx.

## 2011-11-18 NOTE — Telephone Encounter (Signed)
rx sent

## 2011-12-02 ENCOUNTER — Other Ambulatory Visit: Payer: Self-pay | Admitting: Internal Medicine

## 2011-12-18 ENCOUNTER — Other Ambulatory Visit: Payer: Self-pay | Admitting: *Deleted

## 2011-12-18 MED ORDER — LISINOPRIL-HYDROCHLOROTHIAZIDE 20-25 MG PO TABS: 1.0000 | ORAL_TABLET | Freq: Every day | ORAL | Status: DC

## 2012-01-01 ENCOUNTER — Ambulatory Visit (INDEPENDENT_AMBULATORY_CARE_PROVIDER_SITE_OTHER): Payer: 59 | Admitting: Internal Medicine

## 2012-01-01 ENCOUNTER — Encounter: Payer: Self-pay | Admitting: Internal Medicine

## 2012-01-01 VITALS — BP 118/64 | HR 68 | Ht 68.0 in | Wt 208.0 lb

## 2012-01-01 DIAGNOSIS — K5289 Other specified noninfective gastroenteritis and colitis: Secondary | ICD-10-CM

## 2012-01-01 NOTE — Progress Notes (Signed)
Kathy Clark 1961/05/22 MRN [id number]   History of Present Illness:  This is a 51 year old white female with lymphocytic colitis diagnosed during a colonoscopy in April 2007. Her disease flared up after her hysterectomy. Her symptoms were controlled with Entocort and Levbid. Her current flare up was exacerbated after a total knee replacement in January 2013. She started having diarrhea and crampy abdominal pain. Her stool studies were negative, specifically her C. difficile toxin. She was started on Entocort 9 mg daily for 4 weeks then tapered to 6 mg and subsequently 3 mg a day. She is currently on 3 mg daily and denies any diarrhea, abdominal pain, cramps or rectal bleeding. She feels back to her baseline. She has not taken any Levsin recently.   Past Medical History  Diagnosis Date  . Lymphocytic colitis     microscopic  . Migraines   . Essential hypertension, benign   . Osteoarthritis   . 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 Microdiscetomy 2005  . Abdominal hysterectomy 07-2005  . Knee surgery 2006    left; multiple  . Skin cancer excision     eyelid  . Back surgery   . Shoulder surgery 07-22-09    right  . Total knee arthroplasty 07/27/2011    Procedure: TOTAL KNEE ARTHROPLASTY;  Surgeon: Loanne Drilling;  Location: [facility];  Service: Orthopedics;  Laterality: Left;    reports that she has never smoked. She has never used smokeless tobacco. She reports that she drinks alcohol. She reports that she does not use illicit drugs. family history includes Breast cancer in her mother and unspecified family member; Heart disease in her father; and Tongue cancer in her mother.  There is no history of Colon cancer. Allergies  Allergen Reactions  . Succinylcholine Anaphylaxis  . Dilaudid (Hydromorphone Hcl) Nausea And Vomiting        Review of Systems: As her esophageal reflux controlled on Nexium. Denies chest  pain or dysphagia  The remainder of the 10 point ROS is negative except as outlined in H&P   Physical Exam: General appearance  Well developed, in no distress. Psychological normal mood and affect.  Assessment and Plan:  Problem #34 51 year old white female with lymphocytic colitis which has responded to an Entocort taper. This was her second exacerbation since the initial diagnosis in 2007. On both occasions, the disease was activated by surgery. She is doing well and is satisfied with her condition. She will taper off her Entocort to 3 mg every other day until she is out of the medication which would be approximately 2 more weeks. No other studies are indicated. She will be due for a recall colonoscopy in 2017 or earlier if she starts having more flareups.   01/01/2012 Kathy Clark

## 2012-01-01 NOTE — Patient Instructions (Addendum)
Follow up with Dr Juanda Chance as needed. CC: Dr Linford Arnold

## 2012-04-19 ENCOUNTER — Ambulatory Visit (INDEPENDENT_AMBULATORY_CARE_PROVIDER_SITE_OTHER): Payer: 59 | Admitting: Family Medicine

## 2012-04-19 ENCOUNTER — Encounter: Payer: Self-pay | Admitting: Family Medicine

## 2012-04-19 VITALS — BP 111/78 | HR 75 | Ht 68.0 in | Wt 211.0 lb

## 2012-04-19 DIAGNOSIS — I10 Essential (primary) hypertension: Secondary | ICD-10-CM

## 2012-04-19 DIAGNOSIS — H8110 Benign paroxysmal vertigo, unspecified ear: Secondary | ICD-10-CM

## 2012-04-19 DIAGNOSIS — R42 Dizziness and giddiness: Secondary | ICD-10-CM

## 2012-04-19 MED ORDER — MECLIZINE HCL 25 MG PO TABS: 25.0000 mg | ORAL_TABLET | Freq: Three times a day (TID) | ORAL | Status: DC | PRN

## 2012-04-19 NOTE — Progress Notes (Signed)
  Subjective:    Patient ID: Kathy Clark, female    DOB: Dec 06, 1960, 51 y.o.   MRN: [id number]  HPI Hx of vertigo (labrythitis).  Says about 2 weeks ago started feeling like the room is spinning when she rolls over in bed and turns head.  No necessarily when standing up.  Says she was talking to her husband the other day and turned her head and felt it. Also happen when she leaned her head back. It is the worse when she rolls over in bed. She denies any fever or ear pain or cold or cough or congestion. She denies any injury or trauma. She thinks it started around the time that she went to [place] and is on overt ride for her brother's wedding. It did not happen necessarily while on the boat says she noticed around that time frame. The sensation is brief and really last for seconds. She denies any nausea or vomiting with it. No changes in vision.   Review of Systems     Objective:   Physical Exam  Constitutional: She is oriented to person, place, and time. She appears well-developed and well-nourished.  HENT:  Head: Normocephalic and atraumatic.  Right Ear: External ear normal.  Left Ear: External ear normal.  Nose: Nose normal.  Mouth/Throat: Oropharynx is clear and moist.       TMs and canals are clear.   Eyes: Conjunctivae normal and EOM are normal. Pupils are equal, round, and reactive to light.  Neck: Neck supple. No thyromegaly present.  Cardiovascular: Normal rate, regular rhythm and normal heart sounds.   Pulmonary/Chest: Effort normal and breath sounds normal. She has no wheezes.  Lymphadenopathy:    She has no cervical adenopathy.  Neurological: She is alert and oriented to person, place, and time. No cranial nerve deficit.       Positive Dix-Hallpike maneuver with nystagmus to the right.  Skin: Skin is warm and dry.  Psychiatric: She has a normal mood and affect. Her behavior is normal.          Assessment & Plan:  BPV - new diagnosis. based on her exam and her  positive Dix-Hallpike maneuver I do think she has benign positional vertigo. We discussed her diagnosis. We also discussed doing exercises to fatigued response as well as clear out the otolith that is causing the problem. Also recommended the use of meclizine which she has used in the past without difficulty. Prescription sent to her pharmacy. I did ask her to call the office if she's not significantly better in 2 weeks. No sign of infection on exam.  HTN- her blood pressures are well controlled in fact is a little on the low in. It has been consistently looking great when she comes in this we discussed trying to decrease her dose on her blood pressure medication. She says she likes the diuretic so we will try cutting her lisinopril HCTZ down to 10/12.5. She has a availability recheck her blood pressure through work. That way she can keep an eye on call me if it creeps up. The goal is to keep her blood pressure under 130/85. Otherwise followup in 6 months.

## 2012-04-19 NOTE — Patient Instructions (Signed)
Benign Positional Vertigo  Vertigo means you feel like you or your surroundings are moving when they are not. Benign positional vertigo is the most common form of vertigo. Benign means that the cause of your condition is not serious. Benign positional vertigo is more common in older adults.  CAUSES   Benign positional vertigo is the result of an upset in the labyrinth system. This is an area in the middle ear that helps control your balance. This may be caused by a viral infection, head injury, or repetitive motion. However, often no specific cause is found.  SYMPTOMS   Symptoms of benign positional vertigo occur when you move your head or eyes in different directions. Some of the symptoms may include:  · Loss of balance and falls.  · Vomiting.  · Blurred vision.  · Dizziness.  · Nausea.  · Involuntary eye movements (nystagmus).  DIAGNOSIS   Benign positional vertigo is usually diagnosed by physical exam. If the specific cause of your benign positional vertigo is unknown, your caregiver may perform imaging tests, such as magnetic resonance imaging (MRI) or computed tomography (CT).  TREATMENT   Your caregiver may recommend movements or procedures to correct the benign positional vertigo. Medicines such as meclizine, benzodiazepines, and medicines for nausea may be used to treat your symptoms. In rare cases, if your symptoms are caused by certain conditions that affect the inner ear, you may need surgery.  HOME CARE INSTRUCTIONS   · Follow your caregiver's instructions.  · Move slowly. Do not make sudden body or head movements.  · Avoid driving.  · Avoid operating heavy machinery.  · Avoid performing any tasks that would be dangerous to you or others during a vertigo episode.  · Drink enough fluids to keep your urine clear or pale yellow.  SEEK IMMEDIATE MEDICAL CARE IF:   · You develop problems with walking, weakness, numbness, or using your arms, hands, or legs.  · You have difficulty speaking.  · You develop  severe headaches.  · Your nausea or vomiting continues or gets worse.  · You develop visual changes.  · Your family or friends notice any behavioral changes.  · Your condition gets worse.  · You have a fever.  · You develop a stiff neck or sensitivity to light.  MAKE SURE YOU:   · Understand these instructions.  · Will watch your condition.  · Will get help right away if you are not doing well or get worse.  Document Released: 04/06/2006 Document Revised: 09/21/2011 Document Reviewed: 03/19/2011  [employer]® Patient Information ©2013 [employer].

## 2012-07-22 ENCOUNTER — Other Ambulatory Visit: Payer: Self-pay

## 2012-07-22 MED ORDER — SUMATRIPTAN 20 MG/ACT NA SOLN: 1.0000 | NASAL | Status: DC | PRN

## 2012-07-22 NOTE — Telephone Encounter (Signed)
Pharmacy faxed a refill request for sumatriptan and this medication under a historical provider. Is it ok to refill?

## 2012-08-27 ENCOUNTER — Other Ambulatory Visit: Payer: Self-pay

## 2012-09-22 ENCOUNTER — Other Ambulatory Visit: Payer: Self-pay | Admitting: Family Medicine

## 2012-09-22 ENCOUNTER — Encounter: Payer: Self-pay | Admitting: Family Medicine

## 2012-09-22 MED ORDER — LISINOPRIL-HYDROCHLOROTHIAZIDE 10-12.5 MG PO TABS: 1.0000 | ORAL_TABLET | Freq: Every day | ORAL | Status: DC

## 2013-01-03 ENCOUNTER — Other Ambulatory Visit: Payer: Self-pay | Admitting: Family Medicine

## 2013-01-03 NOTE — Telephone Encounter (Signed)
Last seen October 2013

## 2013-01-10 ENCOUNTER — Encounter: Payer: Self-pay | Admitting: Family Medicine

## 2013-01-19 ENCOUNTER — Encounter: Payer: Self-pay | Admitting: Family Medicine

## 2013-01-26 ENCOUNTER — Ambulatory Visit (INDEPENDENT_AMBULATORY_CARE_PROVIDER_SITE_OTHER): Payer: 59 | Admitting: Family Medicine

## 2013-01-26 ENCOUNTER — Encounter: Payer: Self-pay | Admitting: Family Medicine

## 2013-01-26 VITALS — BP 116/70 | HR 71 | Ht 68.0 in | Wt 219.0 lb

## 2013-01-26 DIAGNOSIS — Z6833 Body mass index (BMI) 33.0-33.9, adult: Secondary | ICD-10-CM

## 2013-01-26 DIAGNOSIS — R635 Abnormal weight gain: Secondary | ICD-10-CM

## 2013-01-26 DIAGNOSIS — I10 Essential (primary) hypertension: Secondary | ICD-10-CM

## 2013-01-26 LAB — LIPID PANEL
LDL Cholesterol: 125 mg/dL — ABNORMAL HIGH (ref 0–99)
Total CHOL/HDL Ratio: 3.5 Ratio
VLDL: 15 mg/dL (ref 0–40)

## 2013-01-26 LAB — COMPLETE METABOLIC PANEL WITH GFR
ALT: 22 U/L (ref 0–35)
AST: 17 U/L (ref 0–37)
Albumin: 4.4 g/dL (ref 3.5–5.2)
Alkaline Phosphatase: 51 U/L (ref 39–117)
BUN: 14 mg/dL (ref 6–23)
Calcium: 9.2 mg/dL (ref 8.4–10.5)
Chloride: 104 mEq/L (ref 96–112)
Potassium: 4.1 mEq/L (ref 3.5–5.3)
Sodium: 138 mEq/L (ref 135–145)
Total Protein: 6.9 g/dL (ref 6.0–8.3)

## 2013-01-26 MED ORDER — ESTRADIOL 0.05 MG/24HR TD PTTW: 1.0000 | MEDICATED_PATCH | TRANSDERMAL | Status: DC

## 2013-01-26 MED ORDER — PHENTERMINE HCL 37.5 MG PO CAPS: 37.5000 mg | ORAL_CAPSULE | ORAL | Status: DC

## 2013-01-26 MED ORDER — MECLIZINE HCL 25 MG PO TABS: 25.0000 mg | ORAL_TABLET | Freq: Three times a day (TID) | ORAL | Status: DC | PRN

## 2013-01-26 NOTE — Progress Notes (Signed)
  Subjective:    Patient ID: Kathy Clark, female    DOB: 1961/01/07, 52 y.o.   MRN: [id number]  HPI HTN -  Pt denies chest pain, SOB, dizziness, or heart palpitations.  Taking meds as directed w/o problems.  Denies medication side effects.    Obesity - she is really struggling with her weight. She had really done fantastic with [employer] and has lost about 30 pounds. She was also exercising regularly. She admits she's has fallen off. She got to the point or she got tired of following a [employer] and being very restricted in what she can eat. And she has not been working out recently. She is interested in weight loss medications to help return goal. She did use Fen/Phen years ago right before it was withdrawn from the market.  Review of Systems     Objective:   Physical Exam  Constitutional: She is oriented to person, place, and time. She appears well-developed and well-nourished.  HENT:  Head: Normocephalic and atraumatic.  Cardiovascular: Normal rate, regular rhythm and normal heart sounds.   Pulmonary/Chest: Effort normal and breath sounds normal.  Neurological: She is alert and oriented to person, place, and time.  Skin: Skin is warm and dry.  Psychiatric: She has a normal mood and affect. Her behavior is normal.          Assessment & Plan:  HTN- well controlled. Continue current regimen. Followup in 6 months.  Obesity - discussed optoins. Recommend My[employer]itness Pal and exercise regimen.  Goal of 170. Discussed the risks and benefits of phentermine and potential side effects of the medication. She will followup monthly for blood pressure weight check. She is also aware that every third visit she will need to see the provider. She also understands that the medication has to be done in conjunction with an exercise program and a weight loss regimen.

## 2013-02-07 ENCOUNTER — Other Ambulatory Visit: Payer: Self-pay | Admitting: Family Medicine

## 2013-02-24 ENCOUNTER — Ambulatory Visit (INDEPENDENT_AMBULATORY_CARE_PROVIDER_SITE_OTHER): Payer: 59 | Admitting: Family Medicine

## 2013-02-24 VITALS — BP 116/73 | HR 81 | Wt 215.0 lb

## 2013-02-24 DIAGNOSIS — R635 Abnormal weight gain: Secondary | ICD-10-CM

## 2013-02-24 MED ORDER — PHENTERMINE HCL 37.5 MG PO CAPS: 37.5000 mg | ORAL_CAPSULE | ORAL | Status: DC

## 2013-02-24 NOTE — Progress Notes (Signed)
Subjective:     Patient ID: Kathy Clark, female   DOB: 10-30-1960, 52 y.o.   MRN: [id number]  HPI Patient here for weight and blood pressure check for medication monitoring for phentermine..  Review of Systems     Objective:   Physical Exam     Assessment:     Abnormal weight gain     Plan:     Down 4 pounds and blood pressure looks well controlled. Okay to refill phentermine for one more month. Followup in one month for repeat blood pressure and weight check.

## 2013-03-01 ENCOUNTER — Telehealth: Payer: Self-pay

## 2013-03-01 MED ORDER — MELOXICAM 7.5 MG PO TABS: 7.5000 mg | ORAL_TABLET | Freq: Two times a day (BID) | ORAL | Status: DC | PRN

## 2013-03-01 MED ORDER — CYCLOBENZAPRINE HCL 10 MG PO TABS: 10.0000 mg | ORAL_TABLET | Freq: Every evening | ORAL | Status: DC | PRN

## 2013-03-01 NOTE — Telephone Encounter (Signed)
I will call in muscle relaxer and diclofenac.  Work on gentle stretches, heat and massage. If not getting better then please come in. We can try tramadol if teh diclofenac is not helpful

## 2013-03-01 NOTE — Telephone Encounter (Signed)
patiient stated that she is back having back pain again she wants to know if you can call her something in to her pharmacy or do she need to come in for an appointment. Please advise to send it to [facility].

## 2013-03-01 NOTE — Telephone Encounter (Signed)
Patient informed of medication being called in.

## 2013-03-10 ENCOUNTER — Encounter: Payer: Self-pay | Admitting: Family Medicine

## 2013-03-10 ENCOUNTER — Ambulatory Visit (INDEPENDENT_AMBULATORY_CARE_PROVIDER_SITE_OTHER): Payer: 59 | Admitting: Family Medicine

## 2013-03-10 VITALS — BP 111/76 | HR 83 | Wt 213.0 lb

## 2013-03-10 DIAGNOSIS — M76899 Other specified enthesopathies of unspecified lower limb, excluding foot: Secondary | ICD-10-CM

## 2013-03-10 DIAGNOSIS — M7061 Trochanteric bursitis, right hip: Secondary | ICD-10-CM

## 2013-03-10 NOTE — Progress Notes (Signed)
  Subjective:    Patient ID: Kathy Clark, female    DOB: 07-07-1961, 52 y.o.   MRN: [id number]  HPI Right Hip pain x couple of weeks. Thought intially was from her back, as it started in her low back and radiated around to her hip. She has no pain in the groin crease.. Now her pain is over the greater trochanteric bursitis. The area in her right low back is actually better. Has had bursiits in the past and feels similar.  The right outer hip is sore when she lifts her leg or walks or goes up or down stairs.  Also painful if she puts all of her weight on that one leg. It feels better when she sustained a wart rest. Can't sleep on that side. Has tried NSAID without relief.  Did try some heat as well   Review of Systems     Objective:   Physical Exam Right hip with normal range of motion. Strength is 5 out of 5 at the hip. No significant discomfort or pain with internal or external rotation. She is very tender right over the greater trochanter on the right. Nontender over the lumbar spine. Old surgical scar is well-healed. Nontender over the SI joints or paraspinous muscles. Patellar reflex on the right is 2+.       Assessment & Plan:  Right trochanteric bursitis-discussed diagnosis and treatment options. She opted for an injection today. H.O. given for stretches to start home on her own. Recommend start these after about 2 days at the injection. The home and ice it aggressively today. Avoid sleeping on the hip for the next few days. Call if any problems or not improving.  Bursa Injection Procedure Note  Pre-operative Diagnosis: right Trochanteric bursitis  Post-operative Diagnosis: same  Indications: Diagnosis and treatment of symptomatic bursal effusion  Anesthesia: not required    Procedure Details   After a discussion of the risks and benefits with the patient (including the possibility that any manipulation of the bursa could introduce infection, worsening the current situation  significantly), verbal consent was obtained for the procedure. The joint was prepped with Betadine. An 18 gauge needle was introduced into the bursa.  9ml of lidocaine 1% w/o epid and 1ml of 40mg  kenalog injected. The injection site was cleansed with topical isopropyl alcohol and a dressing was applied.  Complications:  None; patient tolerated the procedure well.

## 2013-03-10 NOTE — Patient Instructions (Addendum)
Please ice the area when she gets home and then later this evening. He can continue anti-inflammatory for couple more days and then try to stop at that point. Start the exercises after 2-3 days.  See handout If you're not feeling better over the next 3 weeks then please let me know

## 2013-03-21 ENCOUNTER — Encounter: Payer: Self-pay | Admitting: Family Medicine

## 2013-03-21 MED ORDER — PREDNISONE 20 MG PO TABS: 40.0000 mg | ORAL_TABLET | Freq: Every day | ORAL | Status: DC

## 2013-03-24 ENCOUNTER — Encounter: Payer: Self-pay | Admitting: Family Medicine

## 2013-03-24 ENCOUNTER — Ambulatory Visit: Payer: 59 | Admitting: Family Medicine

## 2013-03-24 ENCOUNTER — Ambulatory Visit (INDEPENDENT_AMBULATORY_CARE_PROVIDER_SITE_OTHER): Payer: 59 | Admitting: Family Medicine

## 2013-03-24 VITALS — BP 107/65 | HR 83 | Wt 214.0 lb

## 2013-03-24 DIAGNOSIS — M25551 Pain in right hip: Secondary | ICD-10-CM

## 2013-03-24 DIAGNOSIS — M25559 Pain in unspecified hip: Secondary | ICD-10-CM

## 2013-03-24 DIAGNOSIS — R635 Abnormal weight gain: Secondary | ICD-10-CM

## 2013-03-24 MED ORDER — PHENTERMINE HCL 37.5 MG PO CAPS: 37.5000 mg | ORAL_CAPSULE | ORAL | Status: DC

## 2013-03-24 MED ORDER — PREDNISONE 20 MG PO TABS: 40.0000 mg | ORAL_TABLET | Freq: Every day | ORAL | Status: DC

## 2013-03-24 MED ORDER — CYCLOBENZAPRINE HCL 10 MG PO TABS: 10.0000 mg | ORAL_TABLET | Freq: Every evening | ORAL | Status: DC | PRN

## 2013-03-24 NOTE — Progress Notes (Signed)
  Subjective:    Patient ID: Kathy Clark, female    DOB: August 17, 1960, 51 y.o.   MRN: [id number]  HPI Hip is a little better now on the oral prednisone.  She has 2 more days on the prednisone. Her kitchen has still ben repaired. She has alos been on prednisone.  Says hte phentermine has been keeping her up some at night. Says takes it as early as she can. Says hasn't been able to workout. Says has noticed a jiggly feeling on the outer lower leg on the right leg. Has been active moving a lot. The muscle relaxer has been helpful and would like a refill on this.  Abnormal weight gain-she has been taking the phentermine up until she started the prednisone. She has not been able to prepare her own food in her own kitchen because they're doing significant repair were abnormal cut under the flooring in the After a dishwasher leak. Because of the right hip pain she has not been working out regularly as well.  Review of Systems     Objective:   Physical Exam  Constitutional: She is oriented to person, place, and time. She appears well-developed and well-nourished.  HENT:  Head: Normocephalic and atraumatic.  Cardiovascular: Normal rate, regular rhythm and normal heart sounds.   Pulmonary/Chest: Effort normal and breath sounds normal.  Neurological: She is alert and oriented to person, place, and time.  Skin: Skin is warm and dry.  Psychiatric: She has a normal mood and affect. Her behavior is normal.          Assessment & Plan:  Trochanteric bursitis-she didn't get significant relief with the trochanteric bursa injection. She has got a little bit of better relief with the prednisone. Certainly it is possible that some of her discomfort could actually be coming from her low back. She does have a significant history for herniated discs in her lumbar spine. She is traveling out of town next week. When she gets back if she still having significant pain or problems I did encourage her to followup  with her orthopedist. Muscle relaxer refill. Also refilled the prednisone in case she wants to continue a taper for 5 more days.  Abnormal weight gain- has only llost 1lb. Will refill the phentermine.  F/u in 1 month for BP and weight check. She definitely has had some mitigating factors including not being able to repair her own to get her kitchen and having been on prednisone recently. Did go ahead and refill her prescription and she'll followup in 4-5 weeks to recheck blood pressure and weight with the nurse.

## 2013-03-27 ENCOUNTER — Ambulatory Visit: Payer: 59 | Admitting: Family Medicine

## 2013-04-19 ENCOUNTER — Encounter: Payer: Self-pay | Admitting: Family Medicine

## 2013-04-21 ENCOUNTER — Ambulatory Visit (INDEPENDENT_AMBULATORY_CARE_PROVIDER_SITE_OTHER): Payer: 59 | Admitting: Sports Medicine

## 2013-04-21 ENCOUNTER — Encounter: Payer: Self-pay | Admitting: Sports Medicine

## 2013-04-21 VITALS — BP 114/70 | HR 82 | Wt 219.0 lb

## 2013-04-21 DIAGNOSIS — M76899 Other specified enthesopathies of unspecified lower limb, excluding foot: Secondary | ICD-10-CM

## 2013-04-21 DIAGNOSIS — M7061 Trochanteric bursitis, right hip: Secondary | ICD-10-CM | POA: Insufficient documentation

## 2013-04-21 MED ORDER — MELOXICAM 15 MG PO TABS: ORAL_TABLET | ORAL | Status: DC

## 2013-04-21 NOTE — Progress Notes (Signed)
   Subjective:    I'm seeing this patient as a consultation for:  Dr. Linford Arnold  CC: Right hip pain  HPI: This is a very pleasant 52 year old female, she is a Engineer, civil (consulting), for some time now she's had right hip pain that she localizes over the greater trochanter. She's had multiple injections in the past, the most recent of which was about a month ago. Unfortunately she continues to have recurrence of pain after injections. She is never been through rehabilitation or therapy for hip abductors. She has also had a laminotomy with discectomy and decompression in the past, she denies any current radicular symptoms. No pain in the groin, pain is localized on the doesn't radiate, moderate.  Past medical history, Surgical history, Family history not pertinant except as noted below, Social history, Allergies, and medications have been entered into the medical record, reviewed, and no changes needed.   Review of Systems: No headache, visual changes, nausea, vomiting, diarrhea, constipation, dizziness, abdominal pain, skin rash, fevers, chills, night sweats, weight loss, swollen lymph nodes, body aches, joint swelling, muscle aches, chest pain, shortness of breath, mood changes, visual or auditory hallucinations.   Objective:   General: Well Developed, well nourished, and in no acute distress.  Neuro/Psych: Alert and oriented x3, extra-ocular muscles intact, able to move all 4 extremities, sensation grossly intact. Skin: Warm and dry, no rashes noted.  Respiratory: Not using accessory muscles, speaking in full sentences, trachea midline.  Cardiovascular: Pulses palpable, no extremity edema. Abdomen: Does not appear distended. Right Hip: Excellent internal and external rotation, hip abductor strength on the right is extremely weak. She also has a mild Trendelenburg type gait. Pelvic alignment unremarkable to inspection and palpation. Standing hip rotation and gait without trendelenburg sign /  unsteadiness. Greater trochanter without tenderness to palpation. No tenderness over piriformis and greater trochanter. No pain with FABER or FADIR. No SI joint tenderness and normal minimal SI movement.  Impression and Recommendations:   This case required medical decision making of moderate complexity.

## 2013-04-21 NOTE — Assessment & Plan Note (Addendum)
Kathy Clark has classic trochanteric bursitis, she's had several injections which have been temporarily beneficial, but her biomechanics have not yet been corrected. She has persistent weakness of the hip abductors on the right side, very asymmetric to the left with a Trendelenburg type gait. Unless the abnormal biomechanics are corrected, this will remain a recurrent problem. She will do a home hip abductor rehabilitation protocol every day. Unlike to see her back in one month, at that point we can reassess her symptoms and hip abductor strength, if no better I will place her into formal physical therapy. Increasing meloxicam 15 mg daily.

## 2013-04-21 NOTE — Patient Instructions (Signed)
Hip Rehabilitation Protocol:  1.  Side leg raises.  3x30 with no weight, then 3x15 with 2 lb ankle weight, then 3x15 with 5 lb ankle weight 2.  Standing hip rotation.  3x30 with no weight, then 3x15 with 2 lb ankle weight, then 3x15 with 5 lb ankle weight. 3.  Side step ups.  3x30 with no weight, then 3x15 with 5 lbs in backpack, then 3x15 with 10 lbs in backpack. 

## 2013-05-05 ENCOUNTER — Encounter: Payer: Self-pay | Admitting: Sports Medicine

## 2013-05-18 ENCOUNTER — Other Ambulatory Visit: Payer: Self-pay

## 2013-05-18 ENCOUNTER — Other Ambulatory Visit: Payer: Self-pay | Admitting: Family Medicine

## 2013-05-19 ENCOUNTER — Encounter: Payer: Self-pay | Admitting: Sports Medicine

## 2013-05-19 ENCOUNTER — Encounter: Payer: Self-pay | Admitting: Family Medicine

## 2013-05-19 ENCOUNTER — Ambulatory Visit (INDEPENDENT_AMBULATORY_CARE_PROVIDER_SITE_OTHER): Payer: 59 | Admitting: Sports Medicine

## 2013-05-19 VITALS — BP 121/78 | HR 96 | Wt 210.0 lb

## 2013-05-19 DIAGNOSIS — M7061 Trochanteric bursitis, right hip: Secondary | ICD-10-CM

## 2013-05-19 DIAGNOSIS — M76899 Other specified enthesopathies of unspecified lower limb, excluding foot: Secondary | ICD-10-CM

## 2013-05-19 MED ORDER — TRAMADOL HCL 50 MG PO TABS: ORAL_TABLET | ORAL | Status: DC

## 2013-05-19 NOTE — Progress Notes (Signed)
  Subjective:    CC: Follow up  HPI: Chronic right trochanteric bursitis: At the last visit we noted some serious biomechanical deficits, Kathy Clark had severe right-sided hip abductor weakness. I put her on a home rehabilitation program, she returns today with essentially the same amount of pain. It is a localized over the right greater trochanter, and her strength is not improved very much. Pain is persistent, severe, no radiation.  Past medical history, Surgical history, Family history not pertinant except as noted below, Social history, Allergies, and medications have been entered into the medical record, reviewed, and no changes needed.   Review of Systems: No fevers, chills, night sweats, weight loss, chest pain, or shortness of breath.   Objective:    General: Well Developed, well nourished, and in no acute distress.  Neuro: Alert and oriented x3, extra-ocular muscles intact, sensation grossly intact.  HEENT: Normocephalic, atraumatic, pupils equal round reactive to light, neck supple, no masses, no lymphadenopathy, thyroid nonpalpable.  Skin: Warm and dry, no rashes. Cardiac: Regular rate and rhythm, no murmurs rubs or gallops, no lower extremity edema.  Respiratory: Clear to auscultation bilaterally. Not using accessory muscles, speaking in full sentences. Right Hip: ROM IR: 45 Deg, ER: 45 Deg, Flexion: 120 Deg, Extension: 100 Deg, Abduction: 45 Deg, Adduction: 45 Deg Strength IR: 5/5, ER: 5/5, Flexion: 5/5, Extension: 5/5, Abduction: 4/5, Adduction: 5/5 hip abductor strength is still weak. Pelvic alignment unremarkable to inspection and palpation. Standing hip rotation and gait without trendelenburg sign / unsteadiness. Greater trochanter without tenderness to palpation. No tenderness over piriformis and greater trochanter. No pain with FABER or FADIR. No SI joint tenderness and normal minimal SI movement.  Impression and Recommendations:

## 2013-05-19 NOTE — Assessment & Plan Note (Signed)
Gums are overall the same. Unfortunately her hip abductor strength is not much better. I'm going to add tramadol at bedtime, continue Mobic during the day. I am also adding formal physical therapy. She should continue her home exercises as discussed. I like to see her back in 4 weeks if she continues to have pain we can do an injection.

## 2013-05-22 ENCOUNTER — Other Ambulatory Visit: Payer: Self-pay | Admitting: Family Medicine

## 2013-05-22 MED ORDER — PHENTERMINE HCL 37.5 MG PO CAPS: 37.5000 mg | ORAL_CAPSULE | ORAL | Status: DC

## 2013-05-24 ENCOUNTER — Encounter: Payer: Self-pay | Admitting: Family Medicine

## 2013-05-29 ENCOUNTER — Ambulatory Visit: Payer: 59 | Admitting: Physical Therapy

## 2013-05-29 DIAGNOSIS — M25559 Pain in unspecified hip: Secondary | ICD-10-CM

## 2013-05-29 DIAGNOSIS — M6281 Muscle weakness (generalized): Secondary | ICD-10-CM

## 2013-05-29 DIAGNOSIS — M76899 Other specified enthesopathies of unspecified lower limb, excluding foot: Secondary | ICD-10-CM

## 2013-05-29 DIAGNOSIS — R269 Unspecified abnormalities of gait and mobility: Secondary | ICD-10-CM

## 2013-05-31 DIAGNOSIS — M76899 Other specified enthesopathies of unspecified lower limb, excluding foot: Secondary | ICD-10-CM

## 2013-05-31 DIAGNOSIS — M25559 Pain in unspecified hip: Secondary | ICD-10-CM

## 2013-05-31 DIAGNOSIS — M6281 Muscle weakness (generalized): Secondary | ICD-10-CM

## 2013-06-06 ENCOUNTER — Encounter: Payer: 59 | Admitting: Physical Therapy

## 2013-06-06 DIAGNOSIS — M76899 Other specified enthesopathies of unspecified lower limb, excluding foot: Secondary | ICD-10-CM

## 2013-06-06 DIAGNOSIS — M6281 Muscle weakness (generalized): Secondary | ICD-10-CM

## 2013-06-06 DIAGNOSIS — R269 Unspecified abnormalities of gait and mobility: Secondary | ICD-10-CM

## 2013-06-06 DIAGNOSIS — M25559 Pain in unspecified hip: Secondary | ICD-10-CM

## 2013-06-12 ENCOUNTER — Encounter: Payer: 59 | Admitting: Physical Therapy

## 2013-06-12 DIAGNOSIS — R269 Unspecified abnormalities of gait and mobility: Secondary | ICD-10-CM

## 2013-06-12 DIAGNOSIS — M6281 Muscle weakness (generalized): Secondary | ICD-10-CM

## 2013-06-12 DIAGNOSIS — M76899 Other specified enthesopathies of unspecified lower limb, excluding foot: Secondary | ICD-10-CM

## 2013-06-12 DIAGNOSIS — M25559 Pain in unspecified hip: Secondary | ICD-10-CM

## 2013-06-15 ENCOUNTER — Encounter: Payer: 59 | Admitting: Physical Therapy

## 2013-06-15 ENCOUNTER — Encounter (INDEPENDENT_AMBULATORY_CARE_PROVIDER_SITE_OTHER): Payer: 59

## 2013-06-15 DIAGNOSIS — M76899 Other specified enthesopathies of unspecified lower limb, excluding foot: Secondary | ICD-10-CM

## 2013-06-15 DIAGNOSIS — M25559 Pain in unspecified hip: Secondary | ICD-10-CM

## 2013-06-15 DIAGNOSIS — R269 Unspecified abnormalities of gait and mobility: Secondary | ICD-10-CM

## 2013-06-15 DIAGNOSIS — M6281 Muscle weakness (generalized): Secondary | ICD-10-CM

## 2013-06-19 ENCOUNTER — Encounter: Payer: Self-pay | Admitting: Sports Medicine

## 2013-06-19 ENCOUNTER — Ambulatory Visit (INDEPENDENT_AMBULATORY_CARE_PROVIDER_SITE_OTHER): Payer: 59 | Admitting: Sports Medicine

## 2013-06-19 VITALS — BP 111/74 | HR 87 | Wt 210.0 lb

## 2013-06-19 DIAGNOSIS — M7061 Trochanteric bursitis, right hip: Secondary | ICD-10-CM

## 2013-06-19 DIAGNOSIS — M76899 Other specified enthesopathies of unspecified lower limb, excluding foot: Secondary | ICD-10-CM

## 2013-06-19 NOTE — Assessment & Plan Note (Signed)
Doing much better with strength with formal PT. Since we corrected her biomechanical deficits, her pain has improved approximately 80%. Continue 2 additional weeks of formal physical therapy as well as home exercises. If pain continues to be persistent despite correcting her biomechanical deficits, we certainly can consider an injection.

## 2013-06-19 NOTE — Progress Notes (Signed)
  Subjective:    CC: Followup  HPI: Right trochanteric bursitis: Kathy Clark has had several injections, unfortunately she has continued to be plagued by hip abductor weakness, and recurrent episodes of trochanteric bursitis. I placed her through formal physical therapy more recently, and her hip abductor strength has improved significantly. She also reports now that her pain is 80% improved without another injection. She still gets some pain when lying on the ipsilateral side, is eager to continue with non-invasive methods.  Past medical history, Surgical history, Family history not pertinant except as noted below, Social history, Allergies, and medications have been entered into the medical record, reviewed, and no changes needed.   Review of Systems: No fevers, chills, night sweats, weight loss, chest pain, or shortness of breath.   Objective:    General: Well Developed, well nourished, and in no acute distress.  Neuro: Alert and oriented x3, extra-ocular muscles intact, sensation grossly intact.  HEENT: Normocephalic, atraumatic, pupils equal round reactive to light, neck supple, no masses, no lymphadenopathy, thyroid nonpalpable.  Skin: Warm and dry, no rashes. Cardiac: Regular rate and rhythm, no murmurs rubs or gallops, no lower extremity edema.  Respiratory: Clear to auscultation bilaterally. Not using accessory muscles, speaking in full sentences. Right hip: Hip abductor strength has improved significantly. She is no longer tender to palpation over the greater trochanter.  Impression and Recommendations:

## 2013-08-14 ENCOUNTER — Encounter: Payer: Self-pay | Admitting: Family Medicine

## 2013-08-14 ENCOUNTER — Ambulatory Visit (INDEPENDENT_AMBULATORY_CARE_PROVIDER_SITE_OTHER): Payer: 59 | Admitting: Family Medicine

## 2013-08-14 VITALS — BP 124/77 | HR 75 | Temp 97.8°F | Wt 217.0 lb

## 2013-08-14 DIAGNOSIS — J019 Acute sinusitis, unspecified: Secondary | ICD-10-CM

## 2013-08-14 MED ORDER — AZITHROMYCIN 250 MG PO TABS: ORAL_TABLET | ORAL | Status: DC

## 2013-08-14 NOTE — Progress Notes (Signed)
   Subjective:    Patient ID: Kathy Clark, female    DOB: June 13, 1961, 53 y.o.   MRN: [id number]  HPI  Had a URI 3 weeks ago adn then felt better for 7 days but now still having mild congestion pressure in sinuses, cotton mouth and bad tasting drainage in her throat.  Feels like her left ear is getting intermittantly blocked.  No fever, chills, or fever.  Using dayquail and nyquail.  Helps siome.  Wants to sleep a lot.   Review of Systems     Objective:   Physical Exam  Constitutional: She is oriented to person, place, and time. She appears well-developed and well-nourished.  HENT:  Head: Normocephalic and atraumatic.  Right Ear: External ear normal.  Left Ear: External ear normal.  Nose: Nose normal.  Mouth/Throat: Oropharynx is clear and moist.  TMs and canals are clear.   Eyes: Conjunctivae and EOM are normal. Pupils are equal, round, and reactive to light.  Neck: Neck supple. No thyromegaly present.  Cardiovascular: Normal rate, regular rhythm and normal heart sounds.   Pulmonary/Chest: Effort normal and breath sounds normal. She has no wheezes.  Lymphadenopathy:    She has no cervical adenopathy.  Neurological: She is alert and oriented to person, place, and time.  Skin: Skin is warm and dry.  Psychiatric: She has a normal mood and affect.          Assessment & Plan:  Acute sinusitis- treat with azithromycin. Drink lots of fluids. Okay to use over-the-counter cough and cold medications. Call not better in one week.

## 2013-08-17 ENCOUNTER — Other Ambulatory Visit: Payer: Self-pay | Admitting: Family Medicine

## 2013-08-21 ENCOUNTER — Other Ambulatory Visit: Payer: Self-pay | Admitting: Physician Assistant

## 2013-08-21 ENCOUNTER — Encounter: Payer: Self-pay | Admitting: Family Medicine

## 2013-08-21 MED ORDER — METHYLPREDNISOLONE (PAK) 4 MG PO TABS: ORAL_TABLET | ORAL | Status: DC

## 2013-08-31 ENCOUNTER — Encounter: Payer: Self-pay | Admitting: Family Medicine

## 2013-08-31 MED ORDER — FLUCONAZOLE 150 MG PO TABS: 150.0000 mg | ORAL_TABLET | Freq: Once | ORAL | Status: DC

## 2013-10-03 ENCOUNTER — Other Ambulatory Visit: Payer: Self-pay | Admitting: Sports Medicine

## 2013-11-29 ENCOUNTER — Encounter: Payer: Self-pay | Admitting: Family Medicine

## 2013-12-14 ENCOUNTER — Encounter: Payer: Self-pay | Admitting: Family Medicine

## 2013-12-14 ENCOUNTER — Ambulatory Visit (INDEPENDENT_AMBULATORY_CARE_PROVIDER_SITE_OTHER): Payer: 59 | Admitting: Family Medicine

## 2013-12-14 VITALS — BP 106/70 | HR 81 | Ht 68.0 in | Wt 221.0 lb

## 2013-12-14 DIAGNOSIS — G4700 Insomnia, unspecified: Secondary | ICD-10-CM

## 2013-12-14 DIAGNOSIS — F4321 Adjustment disorder with depressed mood: Secondary | ICD-10-CM

## 2013-12-14 DIAGNOSIS — M7989 Other specified soft tissue disorders: Secondary | ICD-10-CM

## 2013-12-14 DIAGNOSIS — I10 Essential (primary) hypertension: Secondary | ICD-10-CM

## 2013-12-14 MED ORDER — TRAZODONE HCL 50 MG PO TABS: 25.0000 mg | ORAL_TABLET | Freq: Every evening | ORAL | Status: DC | PRN

## 2013-12-14 NOTE — Progress Notes (Signed)
   Subjective:    Patient ID: Kathy Clark, female    DOB: 11/30/1960, 53 y.o.   MRN: [id number]  HPI Hypertension- Pt denies chest pain, SOB, dizziness, or heart palpitations.  Taking meds as directed w/o problems.  Denies medication side effects.    Not sleeping well.  She has tried [place] but caused memory issues.  Benadryl makes her groggy . Can fall asleep but can't stay asleep. Waking up mutlipe times at night.  She still struggling emotionally with the loss of her mother. Also her had some close relatives that were recently murdered.  She is still working full-time but has had significant decrease in motivation. She's been very tearful. In feeling down.  When sitting feels like her hands go to sleep.  Had been taking mobic for a really long time. Still feels swollen.  Waking up at night with hands going numb. Noticing that it does happen when she's working on her computer or if she is texting phone at night. She denies any significant neck pain or problems.   Review of Systems     Objective:   Physical Exam  Constitutional: She is oriented to person, place, and time. She appears well-developed and well-nourished.  HENT:  Head: Normocephalic and atraumatic.  Eyes: Conjunctivae and EOM are normal.  Cardiovascular: Normal rate.   Pulmonary/Chest: Effort normal.  Musculoskeletal:  Neck with normal range of motion. Arms with normal range of motion. Negative Tinel's and Phalen's sign. Radial pulse 2+ bilaterally. Trace edema in the hands.  Neurological: She is alert and oriented to person, place, and time.  Skin: Skin is dry. No pallor.  Psychiatric: She has a normal mood and affect. Her behavior is normal.          Assessment & Plan:  HTN- well controlled on current regimen. Continue to work on diet and exercise. Followup in 6 months.  Inomsnia- recommend a trial of trazodone since she did not do well with Ambien or Benadryl in the past. Warned about potential sedation and  side effects. If she does not tolerate it well consider working her medications like Belsomra.   Grieving-encouraged her to consider counseling. She actually has a friend of the family that this grievance counseling encouraged her to think about this. Also consider an antidepressant if needed. She will call me if she decides to start something.  Fluid retention-she did stop her Mobic to make sure that was not causing some fluid retention in her hands and feet. We discussed the importance of low-salt diet. She says she will try this first. If it's not helping and consider increasing her HCTZ to 25 mg. She actually used to be on this dose a couple years ago but after losing some weight she has been able to better control her blood pressure.

## 2014-02-06 ENCOUNTER — Other Ambulatory Visit: Payer: Self-pay | Admitting: Family Medicine

## 2014-02-09 ENCOUNTER — Encounter: Payer: Self-pay | Admitting: Family Medicine

## 2014-02-09 ENCOUNTER — Other Ambulatory Visit: Payer: Self-pay | Admitting: Family Medicine

## 2014-02-09 DIAGNOSIS — I10 Essential (primary) hypertension: Secondary | ICD-10-CM

## 2014-03-05 LAB — CBC
HCT: 43.3 % (ref 36.0–46.0)
HEMOGLOBIN: 14.5 g/dL (ref 12.0–15.0)
MCH: 30 pg (ref 26.0–34.0)
MCHC: 33.5 g/dL (ref 30.0–36.0)
MCV: 89.5 fL (ref 78.0–100.0)
PLATELETS: 295 10*3/uL (ref 150–400)
RBC: 4.84 MIL/uL (ref 3.87–5.11)
RDW: 13.1 % (ref 11.5–15.5)
WBC: 5.3 10*3/uL (ref 4.0–10.5)

## 2014-03-06 LAB — COMPLETE METABOLIC PANEL WITH GFR
ALK PHOS: 44 U/L (ref 39–117)
ALT: 23 U/L (ref 0–35)
AST: 17 U/L (ref 0–37)
Albumin: 4.5 g/dL (ref 3.5–5.2)
BUN: 11 mg/dL (ref 6–23)
CHLORIDE: 102 meq/L (ref 96–112)
CO2: 30 mEq/L (ref 19–32)
CREATININE: 0.61 mg/dL (ref 0.50–1.10)
Calcium: 9.1 mg/dL (ref 8.4–10.5)
GFR, Est African American: >89 mL/min
GFR, Est Non African American: >89 mL/min
Glucose, Bld: 86 mg/dL (ref 70–99)
Potassium: 4.5 mEq/L (ref 3.5–5.3)
Sodium: 139 mEq/L (ref 135–145)
Total Bilirubin: 0.5 mg/dL (ref 0.2–1.2)
Total Protein: 6.7 g/dL (ref 6.0–8.3)

## 2014-03-06 LAB — LIPID PANEL
CHOL/HDL RATIO: 3.5 ratio
Cholesterol: 210 mg/dL — ABNORMAL HIGH (ref 0–200)
HDL: 60 mg/dL (ref >39)
LDL CALC: 126 mg/dL — AB (ref 0–99)
TRIGLYCERIDES: 118 mg/dL (ref <150)
VLDL: 24 mg/dL (ref 0–40)

## 2014-03-14 ENCOUNTER — Ambulatory Visit (INDEPENDENT_AMBULATORY_CARE_PROVIDER_SITE_OTHER): Payer: 59 | Admitting: Family Medicine

## 2014-03-14 ENCOUNTER — Encounter: Payer: Self-pay | Admitting: Family Medicine

## 2014-03-14 VITALS — BP 108/70 | HR 79 | Ht 68.0 in | Wt 226.0 lb

## 2014-03-14 DIAGNOSIS — M7989 Other specified soft tissue disorders: Secondary | ICD-10-CM

## 2014-03-14 DIAGNOSIS — G4700 Insomnia, unspecified: Secondary | ICD-10-CM

## 2014-03-14 DIAGNOSIS — F4321 Adjustment disorder with depressed mood: Secondary | ICD-10-CM

## 2014-03-14 NOTE — Progress Notes (Signed)
   Subjective:    Patient ID: Kathy Clark, female    DOB: [birth year], 53 y.o.   MRN: [id number]  HPI  Grieving -  Doing a litle better.  Says has more good days than bad days  She has a friend who has encouraged her to do greiving counseling.  She just doesn't feel she wants to do it. Still has some days were doesn't feel very motivated at work but has been under a lot of stress at work as well.  She has gained 4 more lbs in the last 3 months.   Insomnia f/u- started trazodone at last OV for sleep.  Intolerant to [place].  Only taking 50mg  of the medication but still waking up at 2:30-3:00 am most nights.   Swelling of extremities and fluid retention.  It is some better.  Still gets more swelling on the left knee.  Review of Systems     Objective:   Physical Exam  Constitutional: She is oriented to person, place, and time. She appears well-developed and well-nourished.  HENT:  Head: Normocephalic and atraumatic.  Neurological: She is alert and oriented to person, place, and time.  Skin: Skin is warm and dry.  Psychiatric: She has a normal mood and affect.          Assessment & Plan:  Greiving- PHQ - 9 score of  5 today.  Stable. Still encouraged her to hink about counseling.  Still very functional at work. Also encouraged her to get some exercise and studies have shown that does improve mood.  Insomnia - improved but not well controlled. I did recommend she try taking one half or up to 2 tabs. She could certainly try this over the weekend in case she does feel sleepy the next day just to see how well she tolerates it. If this is not going well then we could consider a trial of Lunesta. She did not like Ambien in the past.  Lower extremity swelling-improved but still some swelling on the left side where she had knee surgery. Continue to work on low-salt diet regular exercise and weight loss. Unfortunately she has gained 4 more pounds and this will only contribute to her fluid  retention.

## 2014-04-23 ENCOUNTER — Encounter: Payer: Self-pay | Admitting: Family Medicine

## 2014-04-23 MED ORDER — ESZOPICLONE 2 MG PO TABS: 2.0000 mg | ORAL_TABLET | Freq: Every evening | ORAL | Status: DC | PRN

## 2014-06-19 ENCOUNTER — Telehealth: Payer: Self-pay | Admitting: Internal Medicine

## 2014-06-20 NOTE — Telephone Encounter (Signed)
Patient states she went on a business trip and is having loose stool and gas. She would like an OV to renew her medications. Scheduled with Alonza Bogus, PA on 06/21/14 at 2:30 PM.

## 2014-06-21 ENCOUNTER — Ambulatory Visit (INDEPENDENT_AMBULATORY_CARE_PROVIDER_SITE_OTHER): Payer: 59 | Admitting: Gastroenterology

## 2014-06-21 ENCOUNTER — Encounter: Payer: Self-pay | Admitting: Gastroenterology

## 2014-06-21 VITALS — BP 118/72 | HR 84 | Ht 68.0 in | Wt 235.0 lb

## 2014-06-21 DIAGNOSIS — K52839 Microscopic colitis, unspecified: Secondary | ICD-10-CM

## 2014-06-21 DIAGNOSIS — K5289 Other specified noninfective gastroenteritis and colitis: Secondary | ICD-10-CM

## 2014-06-21 MED ORDER — HYOSCYAMINE SULFATE 0.125 MG PO TABS: 0.1250 mg | ORAL_TABLET | Freq: Three times a day (TID) | ORAL | Status: DC | PRN

## 2014-06-21 MED ORDER — HYOSCYAMINE SULFATE ER 0.375 MG PO TB12: 0.3750 mg | ORAL_TABLET | Freq: Two times a day (BID) | ORAL | Status: DC

## 2014-06-21 NOTE — Progress Notes (Signed)
06/21/2014 Kathy Clark [id number] 1961/03/07   HISTORY OF PRESENT ILLNESS:  This is a pleasant 53 year old female who is known to Dr. Olevia Perches for treatment of her lymphocytic colitis.  She was diagnosed on colonoscopy in 10/2005 at which time the study was normal; biopsies showed moderate increase in lymphocytes.  She has been treated with Entocort on occasion, last in June 2013.  Since that time she has been able to control her occasional intermittent diarrhea with Levbid BID for short courses or Levsin prn.  She is here today asking for refills.  Says that she had some diarrhea a couple of weeks ago and was going to use the medication but it was expired.  The diarrhea has since calmed down and was nearly as bad as it had been with flares requiring Entocort.  Now just having one soft to loose BM per day.  Is just asking for refills on her medications.   Past Medical History  Diagnosis Date  . Lymphocytic colitis     microscopic  . Migraines   . Essential hypertension, benign   . Osteoarthritis   . 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 Microdiscetomy  2005  . Abdominal hysterectomy  07-2005  . Knee surgery  2006    left; multiple  . Skin cancer excision      eyelid  . Back surgery    . Shoulder surgery  07-22-09    right  . Total knee arthroplasty  07/27/2011    Procedure: TOTAL KNEE ARTHROPLASTY;  Surgeon: Gearlean Alf;  Location: [facility];  Service: Orthopedics;  Laterality: Left;    reports that she has never smoked. She has never used smokeless tobacco. She reports that she drinks alcohol. She reports that she does not use illicit drugs. family history includes Breast cancer in her mother and another family member; Heart disease in her father; Tongue cancer in her mother. There is no history of Colon cancer. Allergies  Allergen Reactions  . Succinylcholine Anaphylaxis  . Dilaudid  [Hydromorphone Hcl] Nausea And Vomiting      Outpatient Encounter Prescriptions as of 06/21/2014  Medication Sig  . aspirin 81 MG tablet Take 81 mg by mouth daily.  . Calcium Carbonate-Vitamin D (CALCIUM 600 + D PO) Take by mouth. 1 TWICE DAILY  . estradiol (VIVELLE-DOT) 0.075 MG/24HR Place 1 patch onto the skin 2 (two) times a week.  . fish oil-omega-3 fatty acids 1000 MG capsule Take 1 g by mouth 2 (two) times daily.  [name] gabapentin (NEURONTIN) 300 MG capsule Take 300 mg by mouth 2 (two) times daily.   [name] lisinopril-hydrochlorothiazide (PRINZIDE,ZESTORETIC) 10-12.5 MG per tablet TAKE 1 TABLET BY MOUTH DAILY.  . Melatonin 3 MG TABS Take by mouth as directed.  . SUMAtriptan (IMITREX) 20 MG/ACT nasal spray PLACE 1 SPRAY INTO THE NOSE EVERY 2 HOURS AS NEEDED FOR. HEADACHE  . [DISCONTINUED] eszopiclone (LUNESTA) 2 MG TABS tablet Take 1 tablet (2 mg total) by mouth at bedtime as needed for sleep. Take immediately before bedtime  . [DISCONTINUED] meclizine (MEDI-MECLIZINE) 25 MG tablet Take 1 tablet (25 mg total) by mouth 3 (three) times daily as needed for dizziness.  . [DISCONTINUED] meloxicam (MOBIC) 15 MG tablet TAKE 1 TABLET BY MOUTH EVERY MORNING WITH BREAKFAST FOR 2 WEEKS, THEN 1 DAILY AS NEEDED FOR PAIN  . hyoscyamine (LEVBID) 0.375 MG 12 hr tablet Take 1 tablet (  0.375 mg total) by mouth 2 (two) times daily.  . hyoscyamine (LEVSIN) 0.125 MG tablet Take 1 tablet (0.125 mg total) by mouth every 8 (eight) hours as needed.     REVIEW OF SYSTEMS  : All other systems reviewed and negative except where noted in the History of Present Illness.   PHYSICAL EXAM: BP 118/72 mmHg  Pulse 84  Ht 5\' 8"  (1.727 m)  Wt 235 lb (106.595 kg)  BMI 35.74 kg/m2 General: Well developed white female in no acute distress Head: Normocephalic and atraumatic Eyes:  Sclerae anicteric, conjunctiva pink. Ears: Normal auditory acuity Lungs: Clear throughout to auscultation Heart: Regular rate and rhythm Abdomen:  Soft, non-distended.  Normal bowel sounds.  Non-tender. Musculoskeletal: Symmetrical with no gross deformities  Skin: No lesions on visible extremities Extremities: No edema  Neurological: Alert oriented x 4, grossly non-focal Psychological:  Alert and cooperative. Normal mood and affect  ASSESSMENT AND PLAN: -History of lymphocytic colitis:  Only mild intermittent diarrhea.  Well controlled with Levbid BID for a short course or Levsin prn.  Is requesting refills for both of these medications.  Is due for screening colonoscopy 10/2015.

## 2014-06-21 NOTE — Progress Notes (Signed)
Reviewed and agree. Please change her recall colon to March 2017 since I am going to retire October 12, 2015. Thanx DB

## 2014-06-21 NOTE — Patient Instructions (Signed)
We have sent the following medications to your pharmacy for you to pick up at your convenience: Levsin 0.125mg  take 1 tablet my mouth every 8 hours as needed Levbid 0.375mg  Take 1 tablet by mouth twice a day  Follow up with our office as needed.

## 2014-06-28 ENCOUNTER — Encounter: Payer: Self-pay | Admitting: *Deleted

## 2014-06-28 ENCOUNTER — Encounter: Payer: Self-pay | Admitting: Gastroenterology

## 2014-06-28 NOTE — Telephone Encounter (Signed)
error 

## 2014-06-29 ENCOUNTER — Telehealth: Payer: Self-pay | Admitting: *Deleted

## 2014-06-29 MED ORDER — BUDESONIDE 9 MG PO TB24: ORAL_TABLET | ORAL | Status: DC

## 2014-06-29 NOTE — Telephone Encounter (Signed)
Let's put her on Uceris 9 mg daily. Bring her back for follow-up in 6 weeks with myself, or Dr. Olevia Perches if possible.         Thank you,        Jess   Patient notified . Scheduled OV with Dr. Olevia Perches on 08/07/13 at 8:45 AM. Rx sent to pharmacy.

## 2014-07-31 ENCOUNTER — Ambulatory Visit: Payer: 59 | Admitting: Internal Medicine

## 2014-08-07 ENCOUNTER — Encounter: Payer: Self-pay | Admitting: Internal Medicine

## 2014-08-07 ENCOUNTER — Ambulatory Visit (INDEPENDENT_AMBULATORY_CARE_PROVIDER_SITE_OTHER): Payer: 59 | Admitting: Internal Medicine

## 2014-08-07 VITALS — BP 110/70 | HR 76 | Ht 68.0 in | Wt 226.0 lb

## 2014-08-07 DIAGNOSIS — K52839 Microscopic colitis, unspecified: Secondary | ICD-10-CM

## 2014-08-07 DIAGNOSIS — K5289 Other specified noninfective gastroenteritis and colitis: Secondary | ICD-10-CM

## 2014-08-07 NOTE — Patient Instructions (Signed)
Taper Budesonide as directed per Dr Olevia Perches. CC:  Beatrice Lecher MD

## 2014-08-07 NOTE — Progress Notes (Signed)
Kathy Clark [facility] 02-15-1961 [id number]  Note: This dictation was prepared with Dragon digital system. Any transcriptional errors that result from this procedure are unintentional.   History of Present Illness: This is a 54 year old white female with lymphocytic colitis diagnosed on colonoscopy in April 2007. Biopsies showed moderate increase in lymphocytes. Her symptoms were  controlled with Levbid and Levsin. She had a flareup of diarrhea in December 2015 and was  started on budesonide 9 mg daily. Her symptoms subsided 3 weeks later and she is currently back to normal. She denies rectal bleeding diarrhea or abdominal pain. She still on budesonide 9 mg daily. She attributes flareup of the colitis to taking Aleve twice a day for low back pain .She since then has discontinued it. Prior flareup of colitis also occurred also  in association with anti-inflammatory agent at the time of her knee replacement.    Past Medical History  Diagnosis Date  . Lymphocytic colitis     microscopic  . Migraines   . Essential hypertension, benign   . Osteoarthritis   . 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 Microdiscetomy  2005  . Abdominal hysterectomy  07-2005  . Knee surgery  2006    left; multiple  . Skin cancer excision      eyelid  . Back surgery    . Shoulder surgery  07-22-09    right  . Total knee arthroplasty  07/27/2011    Procedure: TOTAL KNEE ARTHROPLASTY;  Surgeon: Gearlean Alf;  Location: [facility];  Service: Orthopedics;  Laterality: Left;    Allergies  Allergen Reactions  . Succinylcholine Anaphylaxis  . Dilaudid [Hydromorphone Hcl] Nausea And Vomiting    Family history and social history have been reviewed.  Review of Systems: Denies diarrhea or rectal bleeding abdominal pain  The remainder of the 10 point ROS is negative except as outlined in the H&P  Physical Exam: General Appearance Well  developed, in no distress Eyes  Non icteric  HEENT  Non traumatic, normocephalic  Mouth No lesion, tongue papillated, no cheilosis Neck Supple without adenopathy, thyroid not enlarged, no carotid bruits, no JVD Lungs Clear to auscultation bilaterally COR Normal S1, normal S2, regular rhythm, no murmur, quiet precordium Abdomen soft relaxed abdomen with normoactive bowel sounds. No tenderness. No distention. No tympany Rectal not done Extremities  No pedal edema Skin No lesions Neurological Alert and oriented x 3 Psychological Normal mood and affect  Assessment and Plan:   54 year old white female with flareup of lymphocytic colitis which responded to budesonide. She will continue to taper  budesonide to 6 mg a day for 2 weeks and afterwards 3 mg a day for 2 weeks and then discontinue. Avoid anti-inflammatory agents in the future. She will be due for recall colonoscopy in April 2017    Dora Brodie @TODAY (<PARAMETER> error)@

## 2014-09-04 ENCOUNTER — Encounter: Payer: Self-pay | Admitting: Family Medicine

## 2014-09-05 MED ORDER — METHOCARBAMOL 500 MG PO TABS: 500.0000 mg | ORAL_TABLET | Freq: Three times a day (TID) | ORAL | Status: DC | PRN

## 2014-09-05 MED ORDER — MELOXICAM 15 MG PO TABS: 15.0000 mg | ORAL_TABLET | Freq: Every day | ORAL | Status: DC

## 2014-09-10 ENCOUNTER — Other Ambulatory Visit: Payer: Self-pay | Admitting: Family Medicine

## 2014-09-15 ENCOUNTER — Telehealth: Payer: Self-pay | Admitting: Family

## 2014-09-15 DIAGNOSIS — J019 Acute sinusitis, unspecified: Secondary | ICD-10-CM

## 2014-09-15 MED ORDER — AMOXICILLIN-POT CLAVULANATE 875-125 MG PO TABS: 1.0000 | ORAL_TABLET | Freq: Two times a day (BID) | ORAL | Status: DC

## 2014-09-15 NOTE — Progress Notes (Signed)

## 2014-09-18 ENCOUNTER — Other Ambulatory Visit: Payer: Self-pay

## 2014-09-18 MED ORDER — LISINOPRIL-HYDROCHLOROTHIAZIDE 10-12.5 MG PO TABS: ORAL_TABLET | ORAL | Status: DC

## 2014-09-20 ENCOUNTER — Other Ambulatory Visit: Payer: Self-pay

## 2014-09-20 MED ORDER — LISINOPRIL-HYDROCHLOROTHIAZIDE 10-12.5 MG PO TABS: ORAL_TABLET | ORAL | Status: DC

## 2014-09-28 ENCOUNTER — Encounter: Payer: Self-pay | Admitting: Family Medicine

## 2014-09-28 ENCOUNTER — Ambulatory Visit (INDEPENDENT_AMBULATORY_CARE_PROVIDER_SITE_OTHER): Payer: 59 | Admitting: Family Medicine

## 2014-09-28 VITALS — BP 118/71 | HR 91 | Ht 68.0 in | Wt 222.0 lb

## 2014-09-28 DIAGNOSIS — I10 Essential (primary) hypertension: Secondary | ICD-10-CM

## 2014-09-28 DIAGNOSIS — Z13220 Encounter for screening for lipoid disorders: Secondary | ICD-10-CM

## 2014-09-28 DIAGNOSIS — Z6833 Body mass index (BMI) 33.0-33.9, adult: Secondary | ICD-10-CM

## 2014-09-28 DIAGNOSIS — Z1159 Encounter for screening for other viral diseases: Secondary | ICD-10-CM

## 2014-09-28 DIAGNOSIS — Z114 Encounter for screening for human immunodeficiency virus [HIV]: Secondary | ICD-10-CM

## 2014-09-28 LAB — HEPATITIS C ANTIBODY: HCV AB: NEGATIVE

## 2014-09-28 LAB — BASIC METABOLIC PANEL WITH GFR
BUN: 16 mg/dL (ref 6–23)
CALCIUM: 9.7 mg/dL (ref 8.4–10.5)
CO2: 28 meq/L (ref 19–32)
CREATININE: 0.72 mg/dL (ref 0.50–1.10)
Chloride: 99 mEq/L (ref 96–112)
GFR, Est African American: >89 mL/min
GFR, Est Non African American: >89 mL/min
Glucose, Bld: 82 mg/dL (ref 70–99)
Potassium: 4.6 mEq/L (ref 3.5–5.3)
Sodium: 139 mEq/L (ref 135–145)

## 2014-09-28 NOTE — Progress Notes (Signed)
   Subjective:    Patient ID: Kathy Clark, female    DOB: 07-Dec-1960, 54 y.o.   MRN: [id number]  HPI Hypertension- Pt denies chest pain, SOB, dizziness, or heart palpitations.  Taking meds as directed w/o problems.  Denies medication side effects.    She has lost 4 lbs on her own on [employer] wat[employer] work.  She is doing well. She is exercising some.  She said when she tried the phentermine she just didn't like how it made her feel and seemed to actually affect her sleep so discontinued it after the second month.   She did have some questions about aspirin and fish oil. She's been taking both for quite a while and wants to know if she should continue this. Review of Systems     Objective:   Physical Exam  Constitutional: She is oriented to person, place, and time. She appears well-developed and well-nourished.  HENT:  Head: Normocephalic and atraumatic.  Cardiovascular: Normal rate, regular rhythm and normal heart sounds.   Pulmonary/Chest: Effort normal and breath sounds normal.  Neurological: She is alert and oriented to person, place, and time.  Skin: Skin is warm and dry.  Psychiatric: She has a normal mood and affect. Her behavior is normal.          Assessment & Plan:  HTN - well controlled. Continue current regimen. Due for blood work. Follow up in 6 months.  BMI 33-she's doing fantastic with Weight Wat[employer] and is well on her way. Encouraged her to keep up the great work with diet and exercise.  Recommend stop fish oil or ASA. We don't usually start aspirin to age 29 in women to prevent stroke and that's usually and more high risk patients. Also fish oil if not taken for next limitations such as hypertriglyceridemia can increase risk of cardiac issues. She said she initially started taking it before she had knee  surgery.

## 2014-09-29 LAB — HIV ANTIBODY (ROUTINE TESTING W REFLEX): HIV: NONREACTIVE

## 2014-10-17 ENCOUNTER — Other Ambulatory Visit: Payer: Self-pay | Admitting: Family Medicine

## 2014-12-12 ENCOUNTER — Encounter: Payer: Self-pay | Admitting: Family Medicine

## 2014-12-24 ENCOUNTER — Encounter: Payer: Self-pay | Admitting: Sports Medicine

## 2014-12-24 ENCOUNTER — Ambulatory Visit (INDEPENDENT_AMBULATORY_CARE_PROVIDER_SITE_OTHER): Payer: 59 | Admitting: Sports Medicine

## 2014-12-24 VITALS — BP 115/69 | HR 68 | Ht 68.0 in | Wt 224.0 lb

## 2014-12-24 DIAGNOSIS — G5602 Carpal tunnel syndrome, left upper limb: Secondary | ICD-10-CM | POA: Insufficient documentation

## 2014-12-24 DIAGNOSIS — G5601 Carpal tunnel syndrome, right upper limb: Secondary | ICD-10-CM

## 2014-12-24 NOTE — Progress Notes (Signed)
   Subjective:    I'm seeing this patient as a consultation for:  Dr. [name]  CC: right carpal tunnel syndrome  HPI: This is a pleasant 54 year old female, for over a year she's had minimal pain but numbness and tingling going into the hand, worse with certain gripping positions and typing. She has been wearing a nighttime extension splint for a year now, and has had some response but unfortunately continues to have symptoms. Symptoms are moderate, persistent, she is dropping items, and she is amenable to proceed to more aggressive invasive treatment.  Past medical history, Surgical history, Family history not pertinant except as noted below, Social history, Allergies, and medications have been entered into the medical record, reviewed, and no changes needed.   Review of Systems: No headache, visual changes, nausea, vomiting, diarrhea, constipation, dizziness, abdominal pain, skin rash, fevers, chills, night sweats, weight loss, swollen lymph nodes, body aches, joint swelling, muscle aches, chest pain, shortness of breath, mood changes, visual or auditory hallucinations.   Objective:   General: Well Developed, well nourished, and in no acute distress.  Neuro/Psych: Alert and oriented x3, extra-ocular muscles intact, able to move all 4 extremities, sensation grossly intact. Skin: Warm and dry, no rashes noted.  Respiratory: Not using accessory muscles, speaking in full sentences, trachea midline.  Cardiovascular: Pulses palpable, no extremity edema. Abdomen: Does not appear distended. Right Wrist: Inspection normal with no visible erythema or swelling. ROM smooth and normal with good flexion and extension and ulnar/radial deviation that is symmetrical with opposite wrist. Palpation is normal over metacarpals, navicular, lunate, and TFCC; tendons without tenderness/ swelling No snuffbox tenderness. No tenderness over Canal of [place]. Strength 5/5 in all directions without pain. Negative  Finkelstein sign, positive Tinel's and Phalen signs. She does have a positive cubital tunnel Tinel's sign however this does not reproduce her similar symptoms, she also has a positive radial tunnel Tinel's sign, also not reproducing her symptoms. Negative Watson's test.  Procedure: Real-time Ultrasound Guided Injection of right median nerve hydrodissection Device: GE Logiq E  Verbal informed consent obtained.  Time-out conducted.  Noted no overlying erythema, induration, or other signs of local infection.  Skin prepped in a sterile fashion.  Local anesthesia: Topical Ethyl chloride.  With sterile technique and under real time ultrasound guidance:  Using a 25-gauge needle and 1 mL kenalog 40, 4 mL lidocaine injected medication both superficial to and deep to the median nerve freeing it from surrounding structures, the needle was redirected and some medication was injected deep into the carpal tunnel around the flexor tendons. Completed without difficulty  Pain immediately resolved suggesting accurate placement of the medication.  Advised to call if fevers/chills, erythema, induration, drainage, or persistent bleeding.  Images permanently stored and available for review in the ultrasound unit.  Impression: Technically successful ultrasound guided injection.  Impression and Recommendations:   This case required medical decision making of moderate complexity.

## 2014-12-24 NOTE — Assessment & Plan Note (Signed)
Symptoms represent classic carpal tunnel syndrome. Unfortunately she failed over 1 year conservative measures including nighttime splinting, as well as gabapentin. Median nerve hydrodissection today under ultrasound guidance. Continue nighttime splinting, she should go to work this afternoon. Return to see me in one month.

## 2015-01-22 ENCOUNTER — Ambulatory Visit (INDEPENDENT_AMBULATORY_CARE_PROVIDER_SITE_OTHER): Payer: 59 | Admitting: Sports Medicine

## 2015-01-22 ENCOUNTER — Encounter: Payer: Self-pay | Admitting: Sports Medicine

## 2015-01-22 VITALS — BP 125/84 | HR 90 | Ht 68.0 in | Wt 216.0 lb

## 2015-01-22 DIAGNOSIS — G5601 Carpal tunnel syndrome, right upper limb: Secondary | ICD-10-CM

## 2015-01-22 NOTE — Assessment & Plan Note (Signed)
Excellent response to a median nerve hydrodissection. 75% resolution of symptoms, happy with how these are going so far. We can do a second procedure if the patient desires to get her percent relief of symptoms, she will have to do this on a Friday afternoon. Return as needed.

## 2015-01-22 NOTE — Progress Notes (Signed)
  Subjective:    CC: Follow-up  HPI: Right carpal tunnel syndrome: After a year of symptoms median nerve hydrodissection has resolved nearly all of her pain and numbness, approximately 75% and she is plateaued. She is not sure yet if she would like to proceed with the second one to attempt to get the rest of her symptoms gone. Ideally she would return on Friday afternoon and then have a weekend off.  Past medical history, Surgical history, Family history not pertinant except as noted below, Social history, Allergies, and medications have been entered into the medical record, reviewed, and no changes needed.   Review of Systems: No fevers, chills, night sweats, weight loss, chest pain, or shortness of breath.   Objective:    General: Well Developed, well nourished, and in no acute distress.  Neuro: Alert and oriented x3, extra-ocular muscles intact, sensation grossly intact.  HEENT: Normocephalic, atraumatic, pupils equal round reactive to light, neck supple, no masses, no lymphadenopathy, thyroid nonpalpable.  Skin: Warm and dry, no rashes. Cardiac: Regular rate and rhythm, no murmurs rubs or gallops, no lower extremity edema.  Respiratory: Clear to auscultation bilaterally. Not using accessory muscles, speaking in full sentences. Right Wrist: Inspection normal with no visible erythema or swelling. ROM smooth and normal with good flexion and extension and ulnar/radial deviation that is symmetrical with opposite wrist. Palpation is normal over metacarpals, navicular, lunate, and TFCC; tendons without tenderness/ swelling No snuffbox tenderness. No tenderness over Canal of [place]. Strength 5/5 in all directions without pain. Negative Finkelstein, tinel's and phalens. Negative Watson's test.  Impression and Recommendations:

## 2015-03-12 ENCOUNTER — Ambulatory Visit (INDEPENDENT_AMBULATORY_CARE_PROVIDER_SITE_OTHER): Payer: 59 | Admitting: Family Medicine

## 2015-03-12 ENCOUNTER — Encounter: Payer: Self-pay | Admitting: Family Medicine

## 2015-03-12 VITALS — BP 118/78 | HR 81 | Ht 68.0 in | Wt 217.0 lb

## 2015-03-12 DIAGNOSIS — I10 Essential (primary) hypertension: Secondary | ICD-10-CM

## 2015-03-12 DIAGNOSIS — M25519 Pain in unspecified shoulder: Secondary | ICD-10-CM | POA: Diagnosis not present

## 2015-03-12 DIAGNOSIS — Z13220 Encounter for screening for lipoid disorders: Secondary | ICD-10-CM

## 2015-03-12 DIAGNOSIS — Z23 Encounter for immunization: Secondary | ICD-10-CM | POA: Diagnosis not present

## 2015-03-12 MED ORDER — HYDROCHLOROTHIAZIDE 12.5 MG PO CAPS: 12.5000 mg | ORAL_CAPSULE | Freq: Every day | ORAL | Status: DC

## 2015-03-12 MED ORDER — MELOXICAM 15 MG PO TABS: 15.0000 mg | ORAL_TABLET | Freq: Every day | ORAL | Status: DC

## 2015-03-12 NOTE — Progress Notes (Signed)
   Subjective:    Patient ID: Kathy Clark, female    DOB: May 29, 1961, 54 y.o.   MRN: [id number]  HPI Hypertension- Pt denies chest pain, SOB, dizziness, or heart palpitations.  Taking meds as directed w/o problems.  Denies medication side effects.  Thinks BP may be going low at time.  Hs been working out and has been getting dizzy occassionaly.   Shoulder has been bothering he rmore as she has been swimming for exercise and would like refill on NSAID.  She has a prescription for meloxicam.  Has been getting more leg cramps. Has been trying to eat bananas.  For example she gets to a cramp sometimes while swimming for exercise.   Review of Systems     Objective:   Physical Exam  Constitutional: She is oriented to person, place, and time. She appears well-developed and well-nourished.  HENT:  Head: Normocephalic and atraumatic.  Cardiovascular: Normal rate, regular rhythm and normal heart sounds.   Pulmonary/Chest: Effort normal and breath sounds normal.  Neurological: She is alert and oriented to person, place, and time.  Skin: Skin is warm and dry.  Psychiatric: She has a normal mood and affect. Her behavior is normal.          Assessment & Plan:  HTN-  Well-controlled and she might be getting some hypertensive episodes. We discussed several options. Discontinue lisinopril/HCT. She likes the diuretic component of her pill would like to keep that. We'll switch her to have HCTZ 12.5 mg daily. 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 She will keep an eye on it over the next few weeks and let me know. Also consider rechecking potassium level on a couple weeks just to be sure that she is able to maintain her potassium with the diuretic. Otherwise follow-up in 6 months.  Shoulder pain -  Refilled meloxicam today.   tdap and flu vaccine gven today.

## 2015-03-14 ENCOUNTER — Encounter: Payer: Self-pay | Admitting: Sports Medicine

## 2015-04-06 LAB — LIPID PANEL
CHOL/HDL RATIO: 3 ratio (ref <=5.0)
CHOLESTEROL: 193 mg/dL (ref 125–200)
HDL: 65 mg/dL (ref >=46)
LDL Cholesterol: 107 mg/dL (ref <130)
TRIGLYCERIDES: 105 mg/dL (ref <150)
VLDL: 21 mg/dL (ref <30)

## 2015-04-06 LAB — COMPLETE METABOLIC PANEL WITH GFR
ALT: 15 U/L (ref 6–29)
AST: 14 U/L (ref 10–35)
Albumin: 4.3 g/dL (ref 3.6–5.1)
Alkaline Phosphatase: 37 U/L (ref 33–130)
BUN: 10 mg/dL (ref 7–25)
CALCIUM: 9.2 mg/dL (ref 8.6–10.4)
CHLORIDE: 101 mmol/L (ref 98–110)
CO2: 29 mmol/L (ref 20–31)
CREATININE: 0.62 mg/dL (ref 0.50–1.05)
GFR, Est Non African American: >89 mL/min (ref >=60)
Glucose, Bld: 80 mg/dL (ref 65–99)
POTASSIUM: 4.3 mmol/L (ref 3.5–5.3)
Sodium: 138 mmol/L (ref 135–146)
Total Bilirubin: 0.8 mg/dL (ref 0.2–1.2)
Total Protein: 6.7 g/dL (ref 6.1–8.1)

## 2015-04-18 ENCOUNTER — Telehealth: Payer: 59 | Admitting: Physician Assistant

## 2015-04-18 DIAGNOSIS — B3731 Acute candidiasis of vulva and vagina: Secondary | ICD-10-CM

## 2015-04-18 DIAGNOSIS — B373 Candidiasis of vulva and vagina: Secondary | ICD-10-CM

## 2015-04-18 MED ORDER — FLUCONAZOLE 150 MG PO TABS: 150.0000 mg | ORAL_TABLET | Freq: Every day | ORAL | Status: DC

## 2015-04-18 NOTE — Progress Notes (Signed)

## 2015-04-23 ENCOUNTER — Encounter: Payer: Self-pay | Admitting: Sports Medicine

## 2015-04-23 ENCOUNTER — Ambulatory Visit (INDEPENDENT_AMBULATORY_CARE_PROVIDER_SITE_OTHER): Payer: 59 | Admitting: Sports Medicine

## 2015-04-23 VITALS — BP 144/93 | HR 88 | Ht 68.0 in | Wt 220.0 lb

## 2015-04-23 DIAGNOSIS — M7552 Bursitis of left shoulder: Secondary | ICD-10-CM

## 2015-04-23 DIAGNOSIS — M7550 Bursitis of unspecified shoulder: Secondary | ICD-10-CM | POA: Insufficient documentation

## 2015-04-23 MED ORDER — TRAMADOL HCL 50 MG PO TABS: ORAL_TABLET | ORAL | Status: DC

## 2015-04-23 NOTE — Assessment & Plan Note (Signed)
With failure of noninvasive measures, nighttime pain. Injection, formal PT. Return in one month.

## 2015-04-23 NOTE — Progress Notes (Signed)
   Subjective:    I'm seeing this patient as a consultation for:  Dr. Beatrice Lecher  CC: Left shoulder pain  HPI: For the past several weeks this pleasant 54 year old female has had increasing pain and she localizes over her left deltoid, with radiation to the elbow, severe, persistent, waking her from sleep, this has been after she has been doing a great deal of sweating. Nothing radicular, no neck pain.  Past medical history, Surgical history, Family history not pertinant except as noted below, Social history, Allergies, and medications have been entered into the medical record, reviewed, and no changes needed.   Review of Systems: No headache, visual changes, nausea, vomiting, diarrhea, constipation, dizziness, abdominal pain, skin rash, fevers, chills, night sweats, weight loss, swollen lymph nodes, body aches, joint swelling, muscle aches, chest pain, shortness of breath, mood changes, visual or auditory hallucinations.   Objective:   General: Well Developed, well nourished, and in no acute distress.  Neuro/Psych: Alert and oriented x3, extra-ocular muscles intact, able to move all 4 extremities, sensation grossly intact. Skin: Warm and dry, no rashes noted.  Respiratory: Not using accessory muscles, speaking in full sentences, trachea midline.  Cardiovascular: Pulses palpable, no extremity edema. Abdomen: Does not appear distended. Left Shoulder: Inspection reveals no abnormalities, atrophy or asymmetry. Palpation is normal with no tenderness over AC joint or bicipital groove. ROM is full in all planes. Rotator cuff strength normal throughout. Positive Neer and Hawkin's tests, empty can. Speeds and Yergason's tests normal. No labral pathology noted with negative Obrien's, negative crank, negative clunk, and good stability. Normal scapular function observed. No painful arc and no drop arm sign. No apprehension sign.  Procedure: Real-time Ultrasound Guided Injection of left  subacromial bursa Device: GE Logiq E  Verbal informed consent obtained.  Time-out conducted.  Noted no overlying erythema, induration, or other signs of local infection.  Skin prepped in a sterile fashion.  Local anesthesia: Topical Ethyl chloride.  With sterile technique and under real time ultrasound guidance:  Noted intact supraspinatus, 25-gauge needle advanced into the subacromial bursa, and taking care to avoid intratendinous injection I injected 1 mL kenalog 40, 3 mL lidocaine easily. Completed without difficulty  Pain immediately resolved suggesting accurate placement of the medication.  Advised to call if fevers/chills, erythema, induration, drainage, or persistent bleeding.  Images permanently stored and available for review in the ultrasound unit.  Impression: Technically successful ultrasound guided injection.  Impression and Recommendations:   This case required medical decision making of moderate complexity.

## 2015-04-28 ENCOUNTER — Telehealth: Payer: 59 | Admitting: Physician Assistant

## 2015-04-28 DIAGNOSIS — J019 Acute sinusitis, unspecified: Secondary | ICD-10-CM | POA: Diagnosis not present

## 2015-04-28 DIAGNOSIS — B9689 Other specified bacterial agents as the cause of diseases classified elsewhere: Secondary | ICD-10-CM

## 2015-04-28 MED ORDER — AMOXICILLIN-POT CLAVULANATE 875-125 MG PO TABS
1.0000 | ORAL_TABLET | Freq: Two times a day (BID) | ORAL | Status: DC
Start: 2015-04-28 — End: 2015-06-04

## 2015-04-28 NOTE — Progress Notes (Signed)
We are sorry that you are not feeling well.  Here is how we plan to help!  Based on what you have shared with me it looks like you have sinusitis.  Sinusitis is inflammation and infection in the sinus cavities of the head.  Based on your presentation I believe you most likely have Acute Bacterial sinusitis.  This is an infection caused by bacteria and is treated with antibiotics.  I have prescribed Augmentin, an antibiotic in the penicillin family, one tablet twice daily with food, for 7 days.. You may use an oral decongestant such as Mucinex D or if you have glaucoma or high blood pressure use plain Mucinex.  Saline nasal sprays help and can safely be used as often as needed for congestion. If you develop worsening sinus pain, fever or notice severe headache and vision changes, or if symptoms are not better after completion of antibiotic, please schedule an appointment with a health care provider.  Sinus infections are not as easily transmitted as other respiratory infection, however we still recommend that you avoid close contact with loved ones, especially the very young and elderly.  Remember to wash your hands thoroughly throughout the day as this is the number one way to prevent the spread of infection!  Home Care:  Only take medications as instructed by your medical team.  Complete the entire course of an antibiotic.  Do not take these medications with alcohol.  A steam or ultrasonic humidifier can help congestion.  You can place a towel over your head and breathe in the steam from hot water coming from a faucet.  Avoid close contacts especially the very young and the elderly.  Cover your mouth when you cough or sneeze.  Always remember to wash your hands.  Get Help Right Away If:  You develop worsening fever or sinus pain.  You develop a severe head ache or visual changes.  Your symptoms persist after you have completed your treatment plan.  Make sure you  Understand these  instructions.  Will watch your condition.  Will get help right away if you are not doing well or get worse.  Your e-visit answers were reviewed by a board certified advanced clinical practitioner to complete your personal care plan.  Depending on the condition, your plan could have included both over the counter or prescription medications.  If there is a problem please reply  once you have received a response from your provider.  Your safety is important to us.  If you have drug allergies check your prescription carefully.    You can use MyChart to ask questions about today's visit, request a non-urgent call back, or ask for a work or school excuse for 24 hours related to this e-Visit. If it has been greater than 24 hours you will need to follow up with your provider, or enter a new e-Visit to address those concerns.  You will get an e-mail in the next two days asking about your experience.  I hope that your e-visit has been valuable and will speed your recovery. Thank you for using e-visits.    

## 2015-05-06 ENCOUNTER — Ambulatory Visit: Payer: 59 | Admitting: Rehabilitative and Restorative Service Providers"

## 2015-05-09 ENCOUNTER — Ambulatory Visit (INDEPENDENT_AMBULATORY_CARE_PROVIDER_SITE_OTHER): Payer: 59 | Admitting: Rehabilitative and Restorative Service Providers"

## 2015-05-09 ENCOUNTER — Encounter: Payer: Self-pay | Admitting: Rehabilitative and Restorative Service Providers"

## 2015-05-09 DIAGNOSIS — M25512 Pain in left shoulder: Secondary | ICD-10-CM

## 2015-05-09 DIAGNOSIS — R293 Abnormal posture: Secondary | ICD-10-CM

## 2015-05-09 DIAGNOSIS — R6889 Other general symptoms and signs: Secondary | ICD-10-CM | POA: Diagnosis not present

## 2015-05-09 DIAGNOSIS — Z7409 Other reduced mobility: Secondary | ICD-10-CM

## 2015-05-09 NOTE — Therapy (Signed)
[place], [place], [address] Phone: [phone]   Fax:  [phone]  Physical Therapy Evaluation  Patient Details  Name: Kathy Clark MRN: [id number] Date of Birth: 04/09/1961 Referring Provider: Dr. Dianah Field  Encounter Date: 05/09/2015      PT End of Session - 05/09/15 1039    Visit Number 1   Number of Visits 6   Date for PT Re-Evaluation 06/20/15   PT Start Time 0949   PT Stop Time 1048   PT Time Calculation (min) 59 min   Activity Tolerance Patient tolerated treatment well      Past Medical History  Diagnosis Date  . Lymphocytic colitis     microscopic  . Migraines   . Essential hypertension, benign   . Osteoarthritis   . 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 Microdiscetomy  2005  . Abdominal hysterectomy  07-2005  . Knee surgery  2006    left; multiple  . Skin cancer excision      eyelid  . Back surgery    . Shoulder surgery  07-22-09    right  . Total knee arthroplasty  07/27/2011    Procedure: TOTAL KNEE ARTHROPLASTY;  Surgeon: Gearlean Alf;  Location: [facility];  Service: Orthopedics;  Laterality: Left;    There were no vitals filed for this visit.  Visit Diagnosis:  Pain in shoulder region, left - Plan: PT plan of care cert/re-cert  Abnormal posture - Plan: PT plan of care cert/re-cert  Impaired functional mobility and endurance - Plan: PT plan of care cert/re-cert  Activity intolerance - Plan: PT plan of care cert/re-cert      Subjective Assessment - 05/09/15 0940    Subjective Patient reports that she started swimming in the summer when she noticed Lt shoulder pain 08/16. She was seen by MD and received injection which has helped. she is no longer swimming and is referred for rehab to be able to return to normal functional activites and exercise.    Pertinent History Bilat  carple tunnel treated with injections; Lt TKA 2013; Rt RCR 2012; L5/Si microdiscetomy 2002   How long can you sit comfortably? no limit   How long can you stand comfortably? no limit   How long can you walk comfortably? no limit   Diagnostic tests [place]    Patient Stated Goals shoulder to be stronger and painfree - return to exercise   Currently in Pain? Yes   Pain Score 5    Pain Location Shoulder   Pain Orientation Left   Pain Descriptors / Indicators Throbbing   Pain Type Acute pain   Pain Radiating Towards throbs into the arm    Pain Onset More than a month ago   Pain Frequency Intermittent   Aggravating Factors  lying on Lt side; reaching; swimming; weight training   Pain Relieving Factors injection; meds; ice             [facility] PT Assessment - 05/09/15 0001    Assessment   Medical Diagnosis Lt subacrominal bursitis    Referring Provider Dr. Dianah Field   Onset Date/Surgical Date 01/26/15   Hand Dominance Right   Next MD Visit 05/21/15   Prior Therapy none for sLt shoulder   Precautions   Precaution Comments no lifting weight or swimming until after PT   Balance Screen   Has the patient fallen  in the past 6 months No   Has the patient had a decrease in activity level because of a fear of falling?  No   Is the patient reluctant to leave their home because of a fear of falling?  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    Leisure swimming/weight training/walking/ household chores/singing in a choir    Observation/Other Assessments   Focus on Therapeutic Outcomes (FOTO)  33% limitation    Sensation   Additional Comments WFL's    Posture/Postural Control   Posture Comments head forward/shoulders rounded and elevated/head of the humerus anterior in orientation/scapulae abducted and rotated along  the thoracic wall   AROM   Right Shoulder Extension 64 Degrees   Right Shoulder Flexion 156 Degrees   Right Shoulder ABduction 165 Degrees   Right Shoulder Internal Rotation 40 Degrees   Right Shoulder External Rotation 106 Degrees   Left Shoulder Extension 63 Degrees   Left Shoulder Flexion 153 Degrees   Left Shoulder ABduction 166 Degrees   Left Shoulder Internal Rotation 34 Degrees   Left Shoulder External Rotation 97 Degrees  painful   Strength   Overall Strength Comments 5/5 bilat UE's    Palpation   Palpation comment tender and painful anterior shoulder; tight through pecs/upper trap/leveator                   [facility] Adult PT Treatment/Exercise - 05/09/15 0001    Therapeutic Activites    Therapeutic Activities --  myofacial ball release work    Neuro Re-ed    Neuro Re-ed Details  working on posterior shoulder girdle strengthening/posture and alignment   Shoulder Exercises: Supine   Other Supine Exercises snow angel prolonged stretch 3-4 min    Shoulder Exercises: Standing   Extension Strengthening;Both;20 reps;Theraband   Theraband Level (Shoulder Extension) Level 2 (Red)   Row Strengthening;Both;20 reps;Theraband   Theraband Level (Shoulder Row) Level 2 (Red)   Retraction Strengthening;Both;20 reps;Theraband   Theraband Level (Shoulder Retraction) Level 1 (Yellow)   Other Standing Exercises scap squeeze with noodle 10 sec hold 1o reps   Shoulder Exercises: Stretch   Corner Stretch Limitations 3 way doorway 20 sec 3 reps each   Cryotherapy   Number Minutes Cryotherapy 15 Minutes   Cryotherapy Location Shoulder   Type of Cryotherapy Ice pack   Electrical Stimulation   Electrical Stimulation Location shoulder    Electrical Stimulation Action IFC   Electrical Stimulation Parameters to tolerance   Electrical Stimulation Goals Pain                PT Education - 05/09/15 1019    Education provided Yes   Education Details posture and alignment;  stretching; strengthening   Person(s) Educated Patient   Methods Explanation;Demonstration;Tactile cues;Verbal cues;Handout   Comprehension Verbalized understanding;Returned demonstration;Verbal cues required;Tactile cues required             PT Long Term Goals - 05/09/15 1050    PT LONG TERM GOAL #1   Title Improve posture and alignment with patient to demonstrate good posterior shoulder girdle position for standing and during exericise 06/20/15   Time 6   Period Weeks   Status New   PT LONG TERM GOAL #2   Title Patient I in HEP for discharge 06/20/15   Time  6   Period Weeks   Status New   PT LONG TERM GOAL #3   Title Patient to return to swimming with minimal to no pain of discomfort 06/20/15   Time 6   Period Weeks   Status New   PT LONG TERM GOAL #4   Title Improve FOTO to </= 28% limitaion 06/20/15   Time 6   Period Weeks   Status New               Plan - 05/09/15 1040    Clinical Impression Statement Patient presents with shoulder pain and dysfunction with onset following return to swimming for exercise. She has poor posture and alignment; poor scapular positioning; decreased shoulder ORM; pain with palpation; pain with functional activity. Pt will benefit form PT to address problems identified.    Pt will benefit from skilled therapeutic intervention in order to improve on the following deficits Postural dysfunction;Improper body mechanics;Decreased range of motion;Decreased mobility;Decreased activity tolerance;Decreased endurance;Pain   Rehab Potential Good   PT Frequency 1x / week   PT Duration 6 weeks   PT Treatment/Interventions Patient/family education;ADLs/Self Care Home Management;Manual techniques;Neuromuscular re-education;Therapeutic exercise;Therapeutic activities;Cryotherapy;Electrical Stimulation;Iontophoresis 4mg /ml Dexamethasone;Moist Heat;Ultrasound;Dry needling   PT Next Visit Plan continue posterior shoulder girdle strengtheing; pec/anterior  shoulder stretching; postural strengthening; shd flexioin stretch bilat    PT Home Exercise Plan postural correction; HEP; issued TB for home   Consulted and Agree with Plan of Care Patient         Problem List Patient Active Problem List   Diagnosis Date Noted  . Subacromial bursitis 04/23/2015  . Carpal tunnel syndrome, right 12/24/2014  . Trochanteric bursitis of right hip 04/21/2013  . Epigastric burning sensation 10/05/2011  . Postop Mild Hyponatremia 07/30/2011  . OSTEOARTHRITIS, KNEE, LEFT 12/12/2009  . MIGRAINE WITH AURA 05/31/2008  . SKIN CANCER, HX OF 01/06/2008  . COLITIS, HX OF 01/06/2008  . ALLERGIC RHINITIS 05/20/2007  . HYPERTENSION, BENIGN ESSENTIAL 11/23/2006  . SCIATICA, CHRONIC 05/19/2006    Shacarra Choe Nilda Simmer PT, MPH 05/09/2015, 10:56 AM  [facility] [place], [place], [address] Phone: [phone]   Fax:  [phone]  Name: Kathy Clark MRN: [id number] Date of Birth: 1961/07/06

## 2015-05-09 NOTE — Patient Instructions (Signed)
Scapular Retraction (Standing)    With arms at sides, pinch shoulder blades down and back hold 10 sec  Repeat __10__ times per set. Do _1-3 ___ sets per session. Do __several__ sessions per day. Can use swim noodle along spine standing at wall.  Scapula Adduction With Pectoralis Stretch: Low - Standing   Shoulders at 45 hands even with shoulders, keeping weight through legs, shift weight forward until you feel pull or stretch through the front of your chest. Hold _30__ seconds. Do _3__ times, _2-4__ times per day.   Scapula Adduction With Pectoralis Stretch: Mid-Range - Standing   Shoulders at 90 elbows even with shoulders, keeping weight through legs, shift weight forward until you feel pull or strength through the front of your chest. Hold __30_ seconds. Do _3__ times, __2-4_ times per day.   Scapula Adduction With Pectoralis Stretch: High - Standing   Shoulders at 120 hands up high on the doorway, keeping weight on feet, shift weight forward until you feel pull or stretch through the front of your chest. Hold _30__ seconds. Do _3__ times, _2-3__ times per day.  Resisted External Rotation: in Neutral - Bilateral   PALMS UP Sit or stand, tubing in both hands, elbows at sides, bent to 90, forearms forward. Pinch shoulder blades together and rotate forearms out. Keep elbows at sides. Repeat __10__ times per set. Do _2-3___ sets per session. Do _2-3___ sessions per day.   Low Row: Standing   Face anchor, feet shoulder width apart. Palms up, pull arms back, squeezing shoulder blades together. Repeat 10__ times per set. Do 2-3__ sets per session. Do 2-3__ sessions per week. Anchor Height: Waist   Strengthening: Resisted Extension   Hold tubing in right hand, arm forward. Pull arm back, elbow straight. Repeat _10___ times per set. Do 2-3____ sets per session. Do 2-3____ sessions per day.

## 2015-05-16 ENCOUNTER — Encounter: Payer: Self-pay | Admitting: Sports Medicine

## 2015-05-21 ENCOUNTER — Ambulatory Visit: Payer: 59 | Admitting: Sports Medicine

## 2015-05-22 ENCOUNTER — Ambulatory Visit (INDEPENDENT_AMBULATORY_CARE_PROVIDER_SITE_OTHER): Payer: 59 | Admitting: Rehabilitative and Restorative Service Providers"

## 2015-05-22 ENCOUNTER — Encounter: Payer: Self-pay | Admitting: Rehabilitative and Restorative Service Providers"

## 2015-05-22 DIAGNOSIS — M25512 Pain in left shoulder: Secondary | ICD-10-CM

## 2015-05-22 DIAGNOSIS — R293 Abnormal posture: Secondary | ICD-10-CM | POA: Diagnosis not present

## 2015-05-22 DIAGNOSIS — Z7409 Other reduced mobility: Secondary | ICD-10-CM

## 2015-05-22 DIAGNOSIS — R6889 Other general symptoms and signs: Secondary | ICD-10-CM | POA: Diagnosis not present

## 2015-05-22 NOTE — Therapy (Signed)
[place], [place], [address] Phone: [phone]   Fax:  [phone]  Physical Therapy Treatment  Patient Details  Name: Kathy Clark MRN: [id number] Date of Birth: 30-May-1961 Referring Provider: Dr. Dianah Field  Encounter Date: 05/22/2015      PT End of Session - 05/22/15 0814    Visit Number 2   Number of Visits 6   Date for PT Re-Evaluation 06/20/15   PT Start Time 0736   PT Stop Time 0810   PT Time Calculation (min) 34 min   Activity Tolerance Patient tolerated treatment well      Past Medical History  Diagnosis Date  . Lymphocytic colitis     microscopic  . Migraines   . Essential hypertension, benign   . Osteoarthritis   . 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 Microdiscetomy  2005  . Abdominal hysterectomy  07-2005  . Knee surgery  2006    left; multiple  . Skin cancer excision      eyelid  . Back surgery    . Shoulder surgery  07-22-09    right  . Total knee arthroplasty  07/27/2011    Procedure: TOTAL KNEE ARTHROPLASTY;  Surgeon: Gearlean Alf;  Location: [facility];  Service: Orthopedics;  Laterality: Left;    There were no vitals filed for this visit.  Visit Diagnosis:  Pain in shoulder region, left  Abnormal posture  Impaired functional mobility and endurance  Activity intolerance      Subjective Assessment - 05/22/15 0736    Subjective Patient reports that her shoulder is doing better. She is doing her HEP but not as much as she should.she has occasional pain reaching overhead but pain no longer keeps her awake and lying on the Lt shoulder does not wake her up at night.    Currently in Pain? No/denies            [facility] PT Assessment - 05/22/15 0001    Assessment   Medical Diagnosis Lt subacrominal bursitis    Onset Date/Surgical Date 01/26/15   Hand Dominance Right    Observation/Other Assessments   Focus on Therapeutic Outcomes (FOTO)  24% limitation    AROM   Left Shoulder Flexion 160 Degrees   Left Shoulder ABduction 170 Degrees   Left Shoulder Internal Rotation 35 Degrees   Left Shoulder External Rotation --  min pain 1-2/10   Palpation   Palpation comment min tender and painful anterior shoulder; tight through pecs/upper trap/leveator                     [facility] Adult PT Treatment/Exercise - 05/22/15 0001    Shoulder Exercises: Prone   Other Prone Exercises prone press series hands clasp/shd abd/goal post x10 ea   Shoulder Exercises: Standing   Extension Strengthening;Both;20 reps;Theraband   Theraband Level (Shoulder Extension) Level 3 (Green)   Row Strengthening;Both;20 reps;Theraband   Theraband Level (Shoulder Row) Level 3 (Green)   Row Limitations 45 deg/90 deg x10 ea   Retraction Strengthening;Both;20 reps;Theraband   Theraband Level (Shoulder Retraction) Level 1 (Yellow)   Shoulder Exercises: Therapy Ball   Flexion Limitations stepping under for shd flexion    Shoulder Exercises: ROM/Strengthening   UBE (Upper Arm Bike) L3 4 min alt fwd/back   Shoulder Exercises: Stretch   Corner Stretch Limitations 3 way doorway 20 sec 3 reps each  PT Education - 05/22/15 0758    Education provided Yes   Education Details HEP   Person(s) Educated Patient   Methods Explanation;Demonstration;Tactile cues;Verbal cues;Handout   Comprehension Verbalized understanding;Returned demonstration;Verbal cues required;Tactile cues required             PT Long Term Goals - 05/22/15 0817    PT LONG TERM GOAL #1   Title Improve posture and alignment with patient to demonstrate good posterior shoulder girdle position for standing and during exericise 06/20/15   Time 6   Period Weeks   Status Achieved   PT LONG TERM GOAL #2   Title Patient I in HEP for discharge 06/20/15   Time 6   Period Weeks   Status Achieved    PT LONG TERM GOAL #3   Title Patient to return to swimming with minimal to no pain of discomfort 06/20/15   Baseline has not returned to swimming    Time 6   Period Weeks   Status Not Met   PT LONG TERM GOAL #4   Title Improve FOTO to </= 28% limitaion 06/20/15   Time 6   Period Weeks   Status Achieved               Plan - 05/22/15 0815    Clinical Impression Statement Patient reports good improvment in shoulder symptoms. Has returned to all functional activities with minimal pain or dysfunction. She feels confident in continuing HEP independently. Goals of therapy have been accomplished.    Pt will benefit from skilled therapeutic intervention in order to improve on the following deficits Postural dysfunction;Improper body mechanics;Decreased range of motion;Decreased mobility;Decreased activity tolerance;Decreased endurance;Pain   Rehab Potential Good   PT Frequency 2x / week   PT Duration 6 weeks   PT Treatment/Interventions Patient/family education;ADLs/Self Care Home Management;Manual techniques;Neuromuscular re-education;Therapeutic exercise;Therapeutic activities;Cryotherapy;Electrical Stimulation;Iontophoresis 37m/ml Dexamethasone;Moist Heat;Ultrasound;Dry needling   PT Next Visit Plan Cont I  HEP   PT Home Exercise Plan postural correction; HEP; issued TB for home   Consulted and Agree with Plan of Care Patient        Problem List Patient Active Problem List   Diagnosis Date Noted  . Subacromial bursitis 04/23/2015  . Carpal tunnel syndrome, right 12/24/2014  . Trochanteric bursitis of right hip 04/21/2013  . Epigastric burning sensation 10/05/2011  . Postop Mild Hyponatremia 07/30/2011  . OSTEOARTHRITIS, KNEE, LEFT 12/12/2009  . MIGRAINE WITH AURA 05/31/2008  . SKIN CANCER, HX OF 01/06/2008  . COLITIS, HX OF 01/06/2008  . ALLERGIC RHINITIS 05/20/2007  . HYPERTENSION, BENIGN ESSENTIAL 11/23/2006  . SCIATICA, CHRONIC 05/19/2006    Ziyonna Christner PNilda Clark,  MPH 05/22/2015, 8:19 AM  [facility] [address] [address]Phone: [phone]  Fax:  [phone] Name: TKEALEY [name]: [id number] of Birth: 911-May-1962   PHYSICAL THERAPY DISCHARGE SUMMARY  Visits from Start of Care:2  Current functional level related to goals / functional outcomes: I in HEP    Remaining deficits: Has not returned to swimming    Education / Equipment: HEP theraband   Plan: Patient agrees to discharge.  Patient goals were partially met. Patient is being discharged due to meeting the stated rehab goals.  ?????    Jevon Littlepage P. HHelene KelpPT, MPH 05/22/2015 8:20 AM

## 2015-05-22 NOTE — Patient Instructions (Addendum)
Ball on Wall: [employer] ball in circles. Do this actively with both hands on ball. 1-2 minutes _1_ times per day.  Scapula Adduction With Pectoralis Stretch: Low - Standing   Shoulders at 45 hands even with shoulders, keeping weight through legs, shift weight forward until you feel pull or stretch through the front of your chest. Hold _30__ seconds. Do _3__ times, _2-4__ times per day.   Scapula Adduction With Pectoralis Stretch: Mid-Range - Standing   Shoulders at 90 elbows even with shoulders, keeping weight through legs, shift weight forward until you feel pull or strength through the front of your chest. Hold __30_ seconds. Do _3__ times, __2-4_ times per day.   Scapula Adduction With Pectoralis Stretch: High - Standing   Shoulders at 120 hands up high on the doorway, keeping weight on feet, shift weight forward until you feel pull or stretch through the front of your chest. Hold _30__ seconds. Do _3__ times, _2-3__ times per day.  Resisted External Rotation: in Neutral - Bilateral   PALMS UP Sit or stand, tubing in both hands, elbows at sides, bent to 90, forearms forward. Pinch shoulder blades together and rotate forearms out. Keep elbows at sides. Repeat __10__ times per set. Do _2-3___ sets per session. Do _2-3___ sessions per day.   Low Row: Standing   Face anchor, feet shoulder width apart. Palms up, pull arms back, squeezing shoulder blades together. Repeat 10__ times per set. Do 2-3__ sets per session. Do 2-3__ sessions per week. Anchor Height: Waist     Strengthening: Resisted Extension   Hold tubing in right hand, arm forward. Pull arm back, elbow straight. Repeat _10___ times per set. Do 2-3____ sets per session. Do 2-3____ sessions per day.   Row: Mid-Range - Standing    With yellow band anchored at chest level, pull elbows backward, squeezing shoulder blades down and back. Keep head and spine neutral. Row __10 1-3 sets _  times, _1__ times per day.   High Row: Standing    Face anchor, feet shoulder width apart. Palms down, pull arms back, squeezing shoulder blades down and back. Repeat _10_ times per set. Do _1-3_ sets per session.    Shoulder Blade Squeeze: Fingers Interlaced    Fingers interlaced behind your body, palms facing each other. Press pelvis down. Squeeze backbone with shoulder blades. Raise front of shoulders, chest, and head. Keep neck neutral. Hold _2-3__ seconds. Relax. Repeat 10-20_ times.   Shoulder Blade Squeeze: W    Arms out to sides at 90 palms down. Bend elbows to 90. Press pelvis down. Squeeze backbone with shoulder blades. Raise arms, front of shoulders, chest, and head. Keep neck neutral. Hold __2-3_ seconds. Relax. Repeat _10-20__ times.   Shoulder Blade Squeeze: Airplane    Arms out to sides at 90, elbows straight, palms down. Press pelvis down. Squeeze backbone with shoulder blades. Raise arms, front of shoulders, chest, and head. Keep neck neutral. Hold _2-3__ seconds. Relax. Repeat _10__ times.

## 2015-06-04 ENCOUNTER — Encounter: Payer: Self-pay | Admitting: Family Medicine

## 2015-06-04 MED ORDER — LISINOPRIL-HYDROCHLOROTHIAZIDE 10-12.5 MG PO TABS: 1.0000 | ORAL_TABLET | Freq: Every day | ORAL | Status: DC

## 2015-06-13 ENCOUNTER — Ambulatory Visit (INDEPENDENT_AMBULATORY_CARE_PROVIDER_SITE_OTHER): Payer: 59 | Admitting: Sports Medicine

## 2015-06-13 ENCOUNTER — Encounter: Payer: Self-pay | Admitting: Sports Medicine

## 2015-06-13 VITALS — BP 128/90 | HR 87 | Wt 217.0 lb

## 2015-06-13 DIAGNOSIS — M7552 Bursitis of left shoulder: Secondary | ICD-10-CM

## 2015-06-13 NOTE — Progress Notes (Signed)
  Subjective:    CC: Follow-up  HPI: Left subacromial bursitis: Resolved after injection and physical therapy.  Past medical history, Surgical history, Family history not pertinant except as noted below, Social history, Allergies, and medications have been entered into the medical record, reviewed, and no changes needed.   Review of Systems: No fevers, chills, night sweats, weight loss, chest pain, or shortness of breath.   Objective:    General: Well Developed, well nourished, and in no acute distress.  Neuro: Alert and oriented x3, extra-ocular muscles intact, sensation grossly intact.  HEENT: Normocephalic, atraumatic, pupils equal round reactive to light, neck supple, no masses, no lymphadenopathy, thyroid nonpalpable.  Skin: Warm and dry, no rashes. Cardiac: Regular rate and rhythm, no murmurs rubs or gallops, no lower extremity edema.  Respiratory: Clear to auscultation bilaterally. Not using accessory muscles, speaking in full sentences. Left Shoulder: Inspection reveals no abnormalities, atrophy or asymmetry. Palpation is normal with no tenderness over AC joint or bicipital groove. ROM is full in all planes. Rotator cuff strength normal throughout. No signs of impingement with negative Neer and Hawkin's tests, empty can. Speeds and Yergason's tests normal. No labral pathology noted with negative Obrien's, negative crank, negative clunk, and good stability. Normal scapular function observed. No painful arc and no drop arm sign. No apprehension sign  Impression and Recommendations:

## 2015-06-13 NOTE — Assessment & Plan Note (Signed)
Completed resolved after injection and physical therapy, return to see me on an as-needed basis.

## 2015-07-11 ENCOUNTER — Ambulatory Visit (INDEPENDENT_AMBULATORY_CARE_PROVIDER_SITE_OTHER): Payer: 59 | Admitting: Osteopathic Medicine

## 2015-07-11 VITALS — BP 122/73 | HR 74 | Ht 68.0 in | Wt 222.0 lb

## 2015-07-11 DIAGNOSIS — F322 Major depressive disorder, single episode, severe without psychotic features: Secondary | ICD-10-CM | POA: Diagnosis not present

## 2015-07-11 MED ORDER — CLONAZEPAM 0.5 MG PO TABS: ORAL_TABLET | ORAL | Status: DC

## 2015-07-11 MED ORDER — FLUOXETINE HCL 20 MG PO TABS: 20.0000 mg | ORAL_TABLET | Freq: Every day | ORAL | Status: DC

## 2015-07-11 NOTE — Progress Notes (Signed)
HPI: Kathy Clark is a 54 y.o. female who presents to [facility] today for chief complaint of:  Chief Complaint  Patient presents with  . Depression    . Context: Patient is having extreme difficulty with her marriage, has been with husband for 30 years, is in counseling, not sure if she will be getting a divorce, she is very upset. She states her counselor recommended that she may need to be on medication . Modifying factors: Previously been on Trazodone for depression/sleep issues after death of her mother and two other relatives in quick succession. Pt didn't like how she felt on this. Also in 1990's had been on Effexor for anxiety but weaned off of that medicine. Felt like this medicine made  . Assoc signs/symptoms: No panic attacks or palpitations, no thoughts of suicide or hurting self or others   Past medical, social and family history reviewed: Past Medical History  Diagnosis Date  . Lymphocytic colitis     microscopic  . Migraines   . Essential hypertension, benign   . Osteoarthritis   . 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 Microdiscetomy  2005  . Abdominal hysterectomy  07-2005  . Knee surgery  2006    left; multiple  . Skin cancer excision      eyelid  . Back surgery    . Shoulder surgery  07-22-09    right  . Total knee arthroplasty  07/27/2011    Procedure: TOTAL KNEE ARTHROPLASTY;  Surgeon: Gearlean Alf;  Location: [facility];  Service: Orthopedics;  Laterality: Left;   Social History  Substance Use Topics  . Smoking status: Never Smoker   . Smokeless tobacco: Never Used  . Alcohol Use: Yes     Comment: occasionally   Family History  Problem Relation Age of Onset  . Breast cancer    . Tongue cancer Mother   . Breast cancer Mother   . Colon cancer Neg Hx   . Heart disease Father   . Stroke Paternal Grandmother     Current Outpatient  Prescriptions  Medication Sig Dispense Refill  . Calcium Carbonate-Vitamin D (CALCIUM 600 + D PO) Take by mouth. 1 TWICE DAILY    . estradiol (VIVELLE-DOT) 0.075 MG/24HR Place 1 patch onto the skin 2 (two) times a week. PATIENT TAKE 1.0 MG ONTO THE SKIN 2 TIMES DAILY    . eszopiclone (LUNESTA) 2 MG TABS tablet Take 1 mg by mouth at bedtime as needed for sleep. Take immediately before bedtime    . gabapentin (NEURONTIN) 300 MG capsule Take 300 mg by mouth 2 (two) times daily.     [name] lisinopril-hydrochlorothiazide (PRINZIDE,ZESTORETIC) 10-12.5 MG tablet Take 1 tablet by mouth daily. 90 tablet 1  . Melatonin 3 MG TABS Take by mouth as directed.    . meloxicam (MOBIC) 15 MG tablet Take 1 tablet (15 mg total) by mouth daily. 90 tablet 1  . SUMAtriptan (IMITREX) 20 MG/ACT nasal spray PLACE 1 SPRAY INTO THE NOSE EVERY 2 HOURS AS NEEDED FOR. HEADACHE 4 Inhaler 2  . traMADol (ULTRAM) 50 MG tablet 1-2 tabs by mouth Q8 hours, maximum 6 tabs per day. 40 tablet 0  . [DISCONTINUED] rivaroxaban (XARELTO) 10 MG TABS tablet Take 1 tablet (10 mg total) by mouth daily with breakfast. 18 tablet 0   No current facility-administered medications for this visit.   Allergies  Allergen Reactions  .  Succinylcholine Anaphylaxis  . Dilaudid [Hydromorphone Hcl] Nausea And Vomiting      Review of Systems: CONSTITUTIONAL:  No  fever, no chills, No  unintentional weight changes CARDIAC: No  chest pain, No  pressure, No palpitations RESPIRATORY: No  cough, No  shortness of breath/wheeze PSYCHIATRIC: Yes  concerns with depression, Yes  concerns with anxiety, Yes sleep problems     Exam:  BP 122/73 mmHg  Pulse 74  Ht 5\' 8"  (1.727 m)  Wt 222 lb (100.699 kg)  BMI 33.76 kg/m2 Constitutional: VS see above. General Appearance: alert, well-developed, well-nourished, NAD. Patient is tearful on interview Psychiatric: Normal judgment/insight. Depressed/tearful mood and affect. Oriented x3.    No results found for this or  any previous visit (from the past 72 hour(s)).    ASSESSMENT/PLAN: Will trial initiation of fluoxetine, patient advised that she can expect to see hopefully a difference in her symptoms within 2-4 weeks, however she is having trouble some side effects prior to then we can certainly change to other antidepressant medication. Benzodiazepine bridge also prescribed, was explained to patient this is a controlled substance, use sparingly to avoid dependence and use only for extreme anxiety/agitation. Advised I would not be prescribing this for more than a month or 2 while we adjust her daily medications to full effects, otherwise continue to follow up with Dr. [name] on this. Patient verbalizes understanding of the plan and all questions were answered. ER precautions were reviewed with regard to thoughts of hurting self or others. Patient has some Lunesta left over, has been using it for sleep, advised to be okay to take this medication  Major depressive disorder, single episode, severe ([place]) - Plan: clonazePAM (KLONOPIN) 0.5 MG tablet, FLUoxetine (PROZAC) 20 MG tablet, DISCONTINUED: FLUoxetine (PROZAC) 20 MG tablet  Return in about 2 weeks (around 07/25/2015) for [place] DR. [name].

## 2015-07-17 ENCOUNTER — Encounter: Payer: Self-pay | Admitting: Osteopathic Medicine

## 2015-07-17 MED ORDER — ESZOPICLONE 2 MG PO TABS: 1.0000 mg | ORAL_TABLET | Freq: Every evening | ORAL | Status: DC | PRN

## 2015-07-17 MED FILL — GABAPENTIN 300 MG CAPSULE: 300 | 30 days supply | Qty: 60 | Fill #1

## 2015-07-17 NOTE — Telephone Encounter (Signed)
I have refilled the Lunesta, we can fax this prescription to your pharmacy. Any additional refills will be at the discretion of Dr. [name], please be sure to keep follow-up appointments with her. Thank you!

## 2015-07-18 MED FILL — ESZOPICLONE 2 MG TABLET: 2 | 60 days supply | Qty: 30 | Fill #0

## 2015-07-20 ENCOUNTER — Telehealth: Payer: 59 | Admitting: Nurse Practitioner

## 2015-07-20 DIAGNOSIS — N3000 Acute cystitis without hematuria: Secondary | ICD-10-CM

## 2015-07-20 MED ORDER — SULFAMETHOXAZOLE-TRIMETHOPRIM 800-160 MG PO TABS: 1.0000 | ORAL_TABLET | Freq: Two times a day (BID) | ORAL | Status: DC

## 2015-07-20 NOTE — Progress Notes (Signed)

## 2015-07-26 ENCOUNTER — Encounter: Payer: Self-pay | Admitting: Family Medicine

## 2015-07-26 ENCOUNTER — Ambulatory Visit (INDEPENDENT_AMBULATORY_CARE_PROVIDER_SITE_OTHER): Payer: 59 | Admitting: Family Medicine

## 2015-07-26 VITALS — BP 139/102 | HR 105 | Wt 217.0 lb

## 2015-07-26 DIAGNOSIS — F329 Major depressive disorder, single episode, unspecified: Secondary | ICD-10-CM | POA: Diagnosis not present

## 2015-07-26 DIAGNOSIS — F32A Depression, unspecified: Secondary | ICD-10-CM

## 2015-07-26 MED ORDER — FLUOXETINE HCL 40 MG PO CAPS: 40.0000 mg | ORAL_CAPSULE | Freq: Every day | ORAL | Status: DC

## 2015-07-26 MED FILL — FLUoxetine HCL 40 MG CAPS: 40 | 30 days supply | Qty: 30 | Fill #0

## 2015-07-26 NOTE — Progress Notes (Signed)
   Subjective:    Patient ID: Kathy Clark, female    DOB: Mar 17, 1961, 55 y.o.   MRN: [id number]  HPI 55 year old female comes in today to follow-up for single depressive episode. She is having some marriage difficulty and is actually thinking about leaving her husband. She has been going to therapy/counseling for at least the last 4 months. He did attend one session with her but has not returned since. They have been married for almost 30 years. Her 2 children are now grown and starting families of their own. She just feels overwhelmed with the pressure of making the decision. She feels very isolated and alone right now but reports she does have support from her friends. She started on fluoxetine about 2 weeks ago. [place] she's not had any negative side effects that are concerning to her. She's noticed a little extra shakiness in her hands. Sleep is fair. No thoughts of hurting herself. She does feel like the fluoxetine is starting to help some. She is also given a small quantity of clonazepam and says she has only used a couple of times.   Review of Systems     Objective:   Physical Exam  Constitutional: She is oriented to person, place, and time. She appears well-developed and well-nourished.  HENT:  Head: Normocephalic and atraumatic.  Cardiovascular: Normal rate, regular rhythm and normal heart sounds.   Pulmonary/Chest: Effort normal and breath sounds normal.  Neurological: She is alert and oriented to person, place, and time.  Skin: Skin is warm and dry.  Psychiatric: She has a normal mood and affect. Her behavior is normal. Judgment and thought content normal.          Assessment & Plan:  Acute situational depression-PHQ 9 score of 11 today and got 7 score of 7. We'll continue with fluoxetine. Discussed option of increasing her dose. She agreed. We'll increase to 40 mg and I will see her back in one month. Continue therapy/counseling.  They may work on an Writer.    Time  spent 25 min, > 50% spent counseling about  Depression.

## 2015-07-30 ENCOUNTER — Encounter: Payer: Self-pay | Admitting: Family Medicine

## 2015-08-02 ENCOUNTER — Other Ambulatory Visit: Payer: Self-pay | Admitting: Family Medicine

## 2015-08-02 MED ORDER — SUVOREXANT 10 MG PO TABS: 10.0000 mg | ORAL_TABLET | Freq: Every day | ORAL | Status: DC

## 2015-08-02 MED ORDER — SUVOREXANT 15 MG PO TABS: 15.0000 mg | ORAL_TABLET | Freq: Every day | ORAL | Status: DC

## 2015-08-02 MED ORDER — SUVOREXANT 20 MG PO TABS: 20.0000 mg | ORAL_TABLET | Freq: Every day | ORAL | Status: DC

## 2015-08-15 ENCOUNTER — Encounter: Payer: Self-pay | Admitting: Family Medicine

## 2015-08-16 MED ORDER — SUVOREXANT 20 MG PO TABS: 20.0000 mg | ORAL_TABLET | Freq: Every day | ORAL | Status: DC

## 2015-08-16 MED FILL — BELSOMRA 20 MG TABLET: 20 | 10 days supply | Qty: 10 | Fill #0

## 2015-08-16 MED FILL — GABAPENTIN 300 MG CAPSULE: 300 | 30 days supply | Qty: 60 | Fill #2

## 2015-08-16 NOTE — Addendum Note (Signed)
Addended by: Silverio Decamp on: 08/16/2015 02:56 PM   Modules accepted: Orders, Medications

## 2015-08-23 ENCOUNTER — Ambulatory Visit: Payer: 59 | Admitting: Sports Medicine

## 2015-08-23 ENCOUNTER — Ambulatory Visit (INDEPENDENT_AMBULATORY_CARE_PROVIDER_SITE_OTHER): Payer: 59 | Admitting: Family Medicine

## 2015-08-23 ENCOUNTER — Encounter: Payer: Self-pay | Admitting: Family Medicine

## 2015-08-23 VITALS — BP 108/74 | HR 73 | Ht 68.0 in | Wt 220.5 lb

## 2015-08-23 DIAGNOSIS — F329 Major depressive disorder, single episode, unspecified: Secondary | ICD-10-CM | POA: Diagnosis not present

## 2015-08-23 DIAGNOSIS — F32A Depression, unspecified: Secondary | ICD-10-CM

## 2015-08-23 MED ORDER — FLUOXETINE HCL 40 MG PO CAPS: 40.0000 mg | ORAL_CAPSULE | Freq: Every day | ORAL | Status: DC

## 2015-08-23 MED FILL — FLUoxetine HCL 40 MG CAPS: 40 | 90 days supply | Qty: 90 | Fill #0

## 2015-08-23 NOTE — Progress Notes (Signed)
   Subjective:    Patient ID: Kathy Clark, female    DOB: 1960/07/28, 55 y.o.   MRN: [id number]  HPI Neftali is here to follow-up on acute depression-she is overall doing better. She is now on fluoxetine 40 mg. We bumped her dose at last time I saw her. Overall she has noticed benefit in her mood and feels like it has been helping. She was to continue with her current regimen. She has noticed a little bit of shakiness when she first was 7 the morning. This was just on couple of occasions she's not sure if it may have happened on mornings where she took her medicine a little later than usual. No prior history of diabetes etc. I did get better after she ate breakfast and got going. She just wanted to mention it in case it was a potential side effect. She'll her husband are actually doing a little bit better. He seems to be more committed to sticking with the marriage. He is also getting evaluated for ADD as this may be a been contributing to a lot of his stressors and issues.   Review of Systems     Objective:   Physical Exam  Constitutional: She is oriented to person, place, and time. She appears well-developed and well-nourished.  HENT:  Head: Normocephalic and atraumatic.  Eyes: Conjunctivae and EOM are normal.  Cardiovascular: Normal rate.   Pulmonary/Chest: Effort normal.  Neurological: She is alert and oriented to person, place, and time.  Skin: Skin is dry. No pallor.  Psychiatric: She has a normal mood and affect. Her behavior is normal.  Vitals reviewed.         Assessment & Plan:  Acute depression-continue with fluoxetine 40 mg. I do think overall she is making some great improvement. He still continues to work with her therapist every 2 weeks through [employer] at work which is fantastic. Asked her to call me if she has any palms were not to make any changes today. Next  Time spent 20 minutes, greater than 50% time spent counseling about depression.

## 2015-09-04 ENCOUNTER — Encounter: Payer: Self-pay | Admitting: Family Medicine

## 2015-09-04 ENCOUNTER — Encounter: Payer: Self-pay | Admitting: Gastroenterology

## 2015-09-04 DIAGNOSIS — Z1231 Encounter for screening mammogram for malignant neoplasm of breast: Secondary | ICD-10-CM | POA: Diagnosis not present

## 2015-09-04 DIAGNOSIS — Z803 Family history of malignant neoplasm of breast: Secondary | ICD-10-CM | POA: Diagnosis not present

## 2015-09-04 LAB — HM MAMMOGRAPHY: HM Mammogram: NORMAL

## 2015-09-05 ENCOUNTER — Telehealth: Payer: Self-pay | Admitting: Gastroenterology

## 2015-09-05 NOTE — Telephone Encounter (Signed)
OK 

## 2015-09-06 ENCOUNTER — Encounter: Payer: Self-pay | Admitting: Family Medicine

## 2015-09-06 MED ORDER — SUVOREXANT 20 MG PO TABS: 20.0000 mg | ORAL_TABLET | Freq: Every day | ORAL | Status: DC

## 2015-09-06 NOTE — Telephone Encounter (Signed)
LM on Vmail to CB to sch'd an appt °

## 2015-09-09 ENCOUNTER — Encounter: Payer: Self-pay | Admitting: Gastroenterology

## 2015-09-10 MED FILL — BELSOMRA 20 MG TABLET: 20 | 30 days supply | Qty: 30 | Fill #0

## 2015-09-18 MED FILL — GABAPENTIN 300 MG CAPSULE: 300 | 30 days supply | Qty: 60 | Fill #3

## 2015-09-25 ENCOUNTER — Telehealth: Payer: Self-pay | Admitting: Gastroenterology

## 2015-09-26 MED ORDER — HYOSCYAMINE SULFATE 0.125 MG SL SUBL: 0.1250 mg | SUBLINGUAL_TABLET | SUBLINGUAL | Status: DC | PRN

## 2015-09-26 MED FILL — ESTRADIOL 0.1 MG PATCH: 0.1 | 84 days supply | Qty: 24 | Fill #1

## 2015-09-26 MED FILL — OSCIMIN SL 0.125 MG TABLET: 0.125 | 7 days supply | Qty: 30 | Fill #0

## 2015-09-26 NOTE — Telephone Encounter (Signed)
Left message for patient to call back  

## 2015-09-26 NOTE — Telephone Encounter (Signed)
Patient reports taking meloxicam for a back injury for the last few weeks. She has a history of lymphocytic colitis.  She has been on Entocort in the past.  Diarrhea started a about a week ago.  She has been taking imodium and helps some.  She is out of her levsin.  I sent in a rx for levsin.  She is currently scheduled in April for a colonoscopy with Dr. Fuller Plan.  Dr. Fuller Plan would you like her seen in the office or ok to send in Entocort as previously prescribed? She will continue imodium, push fluids, and use levsin as needed until Dr. Fuller Plan has a chance to review.  .  She has stopped the Meloxicam.

## 2015-09-27 NOTE — Telephone Encounter (Signed)
Left message for patient to call back  

## 2015-09-27 NOTE — Telephone Encounter (Signed)
Entocort 6 mg qd for 2 months Call if diarrhea not controlled Please have her schedule her surveillance colonoscopy if not already scheduled

## 2015-09-30 MED ORDER — BUDESONIDE 3 MG PO CPEP: 6.0000 mg | ORAL_CAPSULE | Freq: Every day | ORAL | Status: DC

## 2015-09-30 MED FILL — BUDESONIDE EC 3 MG CAPSULE: 3 | 30 days supply | Qty: 60 | Fill #0

## 2015-09-30 NOTE — Telephone Encounter (Signed)
Patient notified She is currently scheduled for colon for April.  She will keep this appt

## 2015-10-11 ENCOUNTER — Ambulatory Visit: Payer: 59 | Admitting: Family Medicine

## 2015-10-15 ENCOUNTER — Ambulatory Visit (INDEPENDENT_AMBULATORY_CARE_PROVIDER_SITE_OTHER): Payer: 59 | Admitting: Family Medicine

## 2015-10-15 ENCOUNTER — Encounter: Payer: Self-pay | Admitting: Family Medicine

## 2015-10-15 VITALS — BP 114/70 | HR 69 | Wt 220.0 lb

## 2015-10-15 DIAGNOSIS — M79675 Pain in left toe(s): Secondary | ICD-10-CM

## 2015-10-15 DIAGNOSIS — G4700 Insomnia, unspecified: Secondary | ICD-10-CM

## 2015-10-15 DIAGNOSIS — K529 Noninfective gastroenteritis and colitis, unspecified: Secondary | ICD-10-CM

## 2015-10-15 DIAGNOSIS — F329 Major depressive disorder, single episode, unspecified: Secondary | ICD-10-CM

## 2015-10-15 DIAGNOSIS — F32A Depression, unspecified: Secondary | ICD-10-CM

## 2015-10-15 NOTE — Progress Notes (Signed)
Subjective:    Patient ID: Kathy Clark, female    DOB: [birth year], 55 y.o.   MRN: [id number]  HPI Follow-up depression-overall she feels like she is doing well on the fluoxetine 40 mg. She denies any significant side effects. She's been seeing a therapist every 2-3 weeks and feels like it's still been very helpful for her. She is still not sleeping well. She did try all the way up to the 20 mg Rozerem and said she felt like it just wasn't that helpful. She slept maybe an hour or 2 more but then would wake up feeling groggy. She said she'd rather not sleep well and not take medication and then take something and feel groggy. Next  She also recently had a flare of colitis again. She had taken meloxicam for about 10 days. She called GI and they have put her on Entocort for the next 2 months as well as Levsin. She is going to be scheduled for repeat colonoscopy at the end of April. The diarrhea has calmed down and is improving.   she's also had some pain in her left big toe. She's worried that she may be getting a bunion.   Review of Systems   BP 114/70 mmHg  Pulse 69  Wt 220 lb (99.791 kg)  SpO2 98%    Allergies  Allergen Reactions  . Succinylcholine Anaphylaxis  . Dilaudid [Hydromorphone Hcl] Nausea And Vomiting    Past Medical History  Diagnosis Date  . Lymphocytic colitis     microscopic  . Migraines   . Essential hypertension, benign   . Osteoarthritis   . 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 Microdiscetomy  2005  . Abdominal hysterectomy  07-2005  . Knee surgery  2006    left; multiple  . Skin cancer excision      eyelid  . Back surgery    . Shoulder surgery  07-22-09    right  . Total knee arthroplasty  07/27/2011    Procedure: TOTAL KNEE ARTHROPLASTY;  Surgeon: Gearlean Alf;  Location: [facility];  Service: Orthopedics;  Laterality: Left;    Social History   Social History  .  Marital Status: Married    Spouse Name: N/A  . Number of Children: N/A  . Years of Education: N/A   Occupational History  . RN [employer]   Social History Main Topics  . Smoking status: Never Smoker   . Smokeless tobacco: Never Used  . Alcohol Use: Yes     Comment: occasionally  . Drug Use: No  . 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   Other Topics Concern  . Not on file   Social History Narrative    Family History  Problem Relation Age of Onset  . Breast cancer    . Tongue cancer Mother   . Breast cancer Mother   . Colon cancer Neg Hx   . Heart disease Father   . Stroke Paternal Grandmother     Outpatient Encounter Prescriptions as of 10/15/2015  Medication Sig  . budesonide (ENTOCORT EC) 3 MG 24 hr capsule Take 2 capsules (6 mg total) by mouth daily.  . Calcium Carbonate-Vitamin D (CALCIUM 600 + D PO) Take by mouth. 1 TWICE DAILY  . clonazePAM (KLONOPIN) 0.5 MG tablet Take 1 tablet up to twice per day as  needed for anxiety. Limit use of this medicine to few days per week to avoid dependence.  [name] estradiol (VIVELLE-DOT) 0.1 MG/24HR patch Place 1 patch onto the skin 2 (two) times a week.  [name] FLUoxetine (PROZAC) 40 MG capsule Take 1 capsule (40 mg total) by mouth daily.  [name] gabapentin (NEURONTIN) 300 MG capsule Take 300 mg by mouth 2 (two) times daily.   . hyoscyamine (LEVSIN/SL) 0.125 MG SL tablet Place 1 tablet (0.125 mg total) under the tongue every 4 (four) hours as needed.  [name] lisinopril-hydrochlorothiazide (PRINZIDE,ZESTORETIC) 10-12.5 MG tablet Take 1 tablet by mouth daily.  . Melatonin 3 MG TABS Take by mouth as directed.  . SUMAtriptan (IMITREX) 20 MG/ACT nasal spray PLACE 1 SPRAY INTO THE NOSE EVERY 2 HOURS AS NEEDED FOR. HEADACHE  . [DISCONTINUED] meloxicam (MOBIC) 15 MG tablet Take 1 tablet (15 mg total) by mouth daily.  . [DISCONTINUED] Suvorexant (BELSOMRA) 20 MG TABS Take 20 mg by mouth at bedtime.    No facility-administered encounter medications on file as of 10/15/2015.          Objective:   Physical Exam  Constitutional: She is oriented to person, place, and time. She appears well-developed and well-nourished.  HENT:  Head: Normocephalic and atraumatic.  Eyes: Conjunctivae and EOM are normal.  Cardiovascular: Normal rate.   Pulmonary/Chest: Effort normal.  Neurological: She is alert and oriented to person, place, and time.  Skin: Skin is dry. No pallor.  Psychiatric: She has a normal mood and affect. Her behavior is normal.  Vitals reviewed.  Left foot with overall fairly normal appearance. She does have a bunion forming at the base of the great toe. Nontender on exam. Normal flexion extension and strength is 5 out of 5 for flexion and extension. Nontender over the joints itself.       Assessment & Plan:  Depression-PHQ 9 score of 6 today. 3 of the points are for insomnia. Though she rates her symptoms as not difficult and would like to continue with her current fluoxetine was no changes. Follow-up in 3 months. Next  Insomnia-would prefer to be off of any type of sleep aid at this point. She also feels like the Entocort is keeping her awake and aggravating her insomnia as well. We'll go ahead and remove the Belsomra from her medication list. Next  Colitis-has repeat anoscopy at the end of April. Currently on Entocort. Avoid NSAIDs for now. Next  Left great toe pain-she is actually starting to get an early bunion on that toe. She also has symptoms tenderness in the joint itself. She has normal range of motion and no evidence of fracture. We discussed metatarsal pads to help relieve foot pain and wearing shoes that provide more support and avoiding flip-flops. Given sample MT pads.  We can also consider referral to podiatry at some point if she would like to have the bunion treated. Avoid wearing tight fitting shoes that compress the ball of the foot.

## 2015-10-16 MED FILL — GABAPENTIN 300 MG CAPSULE: 300 | 30 days supply | Qty: 60 | Fill #4

## 2015-10-16 MED FILL — LISINOPRIL-HCTZ 10-12.5 MG: 10-12.5 | 90 days supply | Qty: 90 | Fill #1

## 2015-10-23 ENCOUNTER — Ambulatory Visit (AMBULATORY_SURGERY_CENTER): Payer: Self-pay

## 2015-10-23 VITALS — Ht 68.0 in | Wt 223.8 lb

## 2015-10-23 DIAGNOSIS — Z1211 Encounter for screening for malignant neoplasm of colon: Secondary | ICD-10-CM

## 2015-10-23 NOTE — Progress Notes (Signed)
No allergies to eggs or soy No diet meds No home oxygen VTACH WITH SUCCS NO PROBLEM WITH CONS SED WITH COLONOSCOPY DILAUDID CAUSES SEVERE N/V  Has email and internet; refused emmi

## 2015-10-28 DIAGNOSIS — G8929 Other chronic pain: Secondary | ICD-10-CM | POA: Diagnosis not present

## 2015-10-28 DIAGNOSIS — M5136 Other intervertebral disc degeneration, lumbar region: Secondary | ICD-10-CM | POA: Diagnosis not present

## 2015-10-28 DIAGNOSIS — M545 Low back pain: Secondary | ICD-10-CM | POA: Diagnosis not present

## 2015-10-29 MED FILL — BUDESONIDE EC 3 MG CAPSULE: 3 | 30 days supply | Qty: 60 | Fill #1

## 2015-10-30 ENCOUNTER — Telehealth: Payer: Self-pay | Admitting: Gastroenterology

## 2015-10-30 NOTE — Telephone Encounter (Signed)
Called number left by patient, no answer but identifies pt by first and last name . Lm that no antibiotic is needed due to a knee replacement for her colonoscopy.  Instructed her to call if she has further questions Kathy Clark PV

## 2015-11-01 DIAGNOSIS — H2513 Age-related nuclear cataract, bilateral: Secondary | ICD-10-CM | POA: Diagnosis not present

## 2015-11-01 DIAGNOSIS — H43393 Other vitreous opacities, bilateral: Secondary | ICD-10-CM | POA: Diagnosis not present

## 2015-11-06 ENCOUNTER — Ambulatory Visit (AMBULATORY_SURGERY_CENTER): Payer: 59 | Admitting: Gastroenterology

## 2015-11-06 ENCOUNTER — Encounter: Payer: Self-pay | Admitting: Gastroenterology

## 2015-11-06 VITALS — BP 119/77 | HR 56 | Temp 97.5°F | Resp 11 | Ht 68.0 in | Wt 223.0 lb

## 2015-11-06 DIAGNOSIS — D125 Benign neoplasm of sigmoid colon: Secondary | ICD-10-CM | POA: Diagnosis not present

## 2015-11-06 DIAGNOSIS — K621 Rectal polyp: Secondary | ICD-10-CM

## 2015-11-06 DIAGNOSIS — I10 Essential (primary) hypertension: Secondary | ICD-10-CM | POA: Diagnosis not present

## 2015-11-06 DIAGNOSIS — Z1211 Encounter for screening for malignant neoplasm of colon: Secondary | ICD-10-CM

## 2015-11-06 DIAGNOSIS — K635 Polyp of colon: Secondary | ICD-10-CM

## 2015-11-06 DIAGNOSIS — D128 Benign neoplasm of rectum: Secondary | ICD-10-CM

## 2015-11-06 MED ORDER — SODIUM CHLORIDE 0.9 % IV SOLN: 500.0000 mL | INTRAVENOUS | Status: DC

## 2015-11-06 NOTE — Progress Notes (Signed)
Report to PACU, RN, vss, BBS= Clear.  

## 2015-11-06 NOTE — Patient Instructions (Signed)
YOU HAD AN ENDOSCOPIC PROCEDURE TODAY AT [facility]:   Refer to the procedure report that was given to you for any specific questions about what was found during the examination.  If the procedure report does not answer your questions, please call your gastroenterologist to clarify.  If you requested that your care partner not be given the details of your procedure findings, then the procedure report has been included in a sealed envelope for you to review at your convenience later.  YOU SHOULD EXPECT: Some feelings of bloating in the abdomen. Passage of more gas than usual.  Walking can help get rid of the air that was put into your GI tract during the procedure and reduce the bloating. If you had a lower endoscopy (such as a colonoscopy or flexible sigmoidoscopy) you may notice spotting of blood in your stool or on the toilet paper. If you underwent a bowel prep for your procedure, you may not have a normal bowel movement for a few days.  Please Note:  You might notice some irritation and congestion in your nose or some drainage.  This is from the oxygen used during your procedure.  There is no need for concern and it should clear up in a day or so.  SYMPTOMS TO REPORT IMMEDIATELY:   Following lower endoscopy (colonoscopy or flexible sigmoidoscopy):  Excessive amounts of blood in the stool  Significant tenderness or worsening of abdominal pains  Swelling of the abdomen that is new, acute  Fever of 100F or higher   Following upper endoscopy (EGD)  Vomiting of blood or coffee ground material  New chest pain or pain under the shoulder blades  Painful or persistently difficult swallowing  New shortness of breath  Fever of 100F or higher  Black, tarry-looking stools  For urgent or emergent issues, a gastroenterologist can be reached at any hour by calling [phone].   DIET: Your first meal following the procedure should be a small meal and then it is ok to progress to  your normal diet. Heavy or fried foods are harder to digest and may make you feel nauseous or bloated.  Likewise, meals heavy in dairy and vegetables can increase bloating.  Drink plenty of fluids but you should avoid alcoholic beverages for 24 hours.  ACTIVITY:  You should plan to take it easy for the rest of today and you should NOT DRIVE or use heavy machinery until tomorrow (because of the sedation medicines used during the test).    FOLLOW UP: Our staff will call the number listed on your records the next business day following your procedure to check on you and address any questions or concerns that you may have regarding the information given to you following your procedure. If we do not reach you, we will leave a message.  However, if you are feeling well and you are not experiencing any problems, there is no need to return our call.  We will assume that you have returned to your regular daily activities without incident.  If any biopsies were taken you will be contacted by phone or by letter within the next 1-3 weeks.  Please call us at [phone] if you have not heard about the biopsies in 3 weeks.  Please read all your handouts given to you by your healthcare provider.   SIGNATURES/CONFIDENTIALITY: You and/or your care partner have signed paperwork which will be entered into your electronic medical record.  These signatures attest to the fact that  that the information above on your After Visit Summary has been reviewed and is understood.  Full responsibility of the confidentiality of this discharge information lies with you and/or your care-partner.  Thank you for letting us take care of your healthcare needs today.

## 2015-11-06 NOTE — Progress Notes (Signed)
Called to room to assist during endoscopic procedure.  Patient ID and intended procedure confirmed with present staff. Received instructions for my participation in the procedure from the performing physician.  

## 2015-11-06 NOTE — Op Note (Signed)
[facility] Patient Name: Kathy Clark Procedure Date: 11/06/2015 8:52 AM MRN: [id number] Endoscopist: Ladene Artist , MD Age: 55 Date of Birth: 1961-01-13 Gender: Female Procedure:                Colonoscopy Indications:              Screening for colorectal malignant neoplasm Medicines:                Monitored Anesthesia Care Procedure:                Pre-Anesthesia Assessment:                           - Prior to the procedure, a History and Physical                            was performed, and patient medications and                            allergies were reviewed. The patient's tolerance of                            previous anesthesia was also reviewed. The risks                            and benefits of the procedure and the sedation                            options and risks were discussed with the patient.                            All questions were answered, and informed consent                            was obtained. Prior Anticoagulants: The patient has                            taken no previous anticoagulant or antiplatelet                            agents. ASA Grade Assessment: II - A patient with                            mild systemic disease. After reviewing the risks                            and benefits, the patient was deemed in                            satisfactory condition to undergo the procedure.                           After obtaining informed consent, the colonoscope  was passed under direct vision. Throughout the                            procedure, the patient's blood pressure, pulse, and                            oxygen saturations were monitored continuously. The                            Model PCF-H190DL [id number]) scope was introduced                            through the anus and advanced to the the cecum,                            identified by appendiceal orifice and ileocecal                         valve. The colonoscopy was performed without                            difficulty. The patient tolerated the procedure                            well. The quality of the bowel preparation was                            good. The ileocecal valve, appendiceal orifice, and                            rectum were photographed. Scope In: 8:59:02 AM Scope Out: 9:12:46 AM Scope Withdrawal Time: 0 hours 11 minutes 50 seconds  Total Procedure Duration: 0 hours 13 minutes 44 seconds  Findings:                 The digital rectal exam was normal.                           A 8 mm polyp was found in the sigmoid colon. The                            polyp was sessile. The polyp was removed with a                            cold snare. Resection and retrieval were complete.                           A 4 mm polyp was found in the rectum. The polyp was                            sessile. The polyp was removed with a cold biopsy                            forceps. Resection and  retrieval were complete.                           The exam was otherwise normal throughout the                            examined colon.                           The retroflexed view of the distal rectum and anal                            verge was normal and showed no anal or rectal                            abnormalities. Complications:            No immediate complications. Estimated Blood Loss:     Estimated blood loss: none. Impression:               - One 8 mm polyp in the sigmoid colon, removed with                            a cold snare. Resected and retrieved.                           - One 4 mm polyp in the rectum, removed with a cold                            biopsy forceps. Resected and retrieved. Recommendation:           - Patient has a contact number available for                            emergencies. The signs and symptoms of potential                            delayed complications  were discussed with the                            patient. Return to normal activities tomorrow.                            Written discharge instructions were provided to the                            patient.                           - Resume previous diet.                           - Continue present medications.                           - Await pathology results.                           -  Repeat colonoscopy in 5 years for surveillance if                            polyp(s) are precancerous, otherwise 10 years. Ladene Artist, MD 11/06/2015 9:20:21 AM This report has been signed electronically.

## 2015-11-07 ENCOUNTER — Telehealth: Payer: Self-pay | Admitting: *Deleted

## 2015-11-07 NOTE — Telephone Encounter (Signed)
Left message on f/u call 

## 2015-11-14 ENCOUNTER — Telehealth: Payer: 59 | Admitting: Physician Assistant

## 2015-11-14 DIAGNOSIS — J00 Acute nasopharyngitis [common cold]: Secondary | ICD-10-CM | POA: Diagnosis not present

## 2015-11-14 DIAGNOSIS — J208 Acute bronchitis due to other specified organisms: Principal | ICD-10-CM

## 2015-11-14 DIAGNOSIS — B9689 Other specified bacterial agents as the cause of diseases classified elsewhere: Secondary | ICD-10-CM

## 2015-11-14 MED ORDER — AZITHROMYCIN 250 MG PO TABS: ORAL_TABLET | ORAL | Status: DC

## 2015-11-14 NOTE — Progress Notes (Signed)

## 2015-11-15 ENCOUNTER — Encounter: Payer: Self-pay | Admitting: Gastroenterology

## 2015-11-15 MED FILL — GABAPENTIN 300 MG CAPSULE: 300 | 90 days supply | Qty: 180 | Fill #0

## 2015-11-25 MED FILL — BUDESONIDE EC 3 MG CAPSULE: 3 | 30 days supply | Qty: 60 | Fill #2

## 2015-11-25 MED FILL — FLUoxetine HCL 40 MG CAPS: 40 | 90 days supply | Qty: 90 | Fill #1

## 2015-12-16 ENCOUNTER — Encounter: Payer: Self-pay | Admitting: Family Medicine

## 2015-12-16 ENCOUNTER — Telehealth: Payer: Self-pay | Admitting: Gastroenterology

## 2015-12-16 NOTE — Telephone Encounter (Signed)
Patient notified

## 2015-12-16 NOTE — Telephone Encounter (Signed)
Patient reports that she is having spontaneous bruising and bruising with very minimal impact.  She discussed with Dr. Ward Chatters who asked her to call us since bruising is a side effect of entocort. Dr. Fuller Plan please advise

## 2015-12-16 NOTE — Telephone Encounter (Signed)
Very unlikely as Entocort is designed not to be absorbed systemically from the GI tract. She should look for other cause per her PCP.

## 2015-12-16 NOTE — Telephone Encounter (Signed)
Left a message for patient to call back. 

## 2015-12-17 ENCOUNTER — Telehealth: Payer: 59 | Admitting: Family

## 2015-12-17 DIAGNOSIS — J209 Acute bronchitis, unspecified: Secondary | ICD-10-CM | POA: Diagnosis not present

## 2015-12-17 DIAGNOSIS — R05 Cough: Secondary | ICD-10-CM | POA: Diagnosis not present

## 2015-12-17 DIAGNOSIS — R059 Cough, unspecified: Secondary | ICD-10-CM

## 2015-12-17 MED ORDER — ALBUTEROL SULFATE HFA 108 (90 BASE) MCG/ACT IN AERS: 2.0000 | INHALATION_SPRAY | Freq: Four times a day (QID) | RESPIRATORY_TRACT | Status: DC | PRN

## 2015-12-17 MED ORDER — BENZONATATE 100 MG PO CAPS: 100.0000 mg | ORAL_CAPSULE | Freq: Three times a day (TID) | ORAL | Status: DC | PRN

## 2015-12-17 MED FILL — BENZONATATE 100 MG CAPSULE: 100 | 7 days supply | Qty: 20 | Fill #0

## 2015-12-17 MED FILL — VENTOLIN HFA 90 MCG INHALER: 108 (90 BAS | 25 days supply | Qty: 18 | Fill #0

## 2015-12-17 NOTE — Progress Notes (Signed)
We are sorry that you are not feeling well.  Here is how we plan to help!  Based on what you have shared with me it looks like you have upper respiratory tract inflammation that has resulted in a significant cough.  Inflammation and infection in the upper respiratory tract is commonly called bronchitis and has four common causes:  Allergies, Viral Infections, Acid Reflux and Bacterial Infections.  Allergies, viruses and acid reflux are treated by controlling symptoms or eliminating the cause. An example might be a cough caused by taking certain blood pressure medications. You stop the cough by changing the medication. Another example might be a cough caused by acid reflux. Controlling the reflux helps control the cough.  Based on your presentation I believe you most likely have A cough due to a virus.  This is called viral bronchitis and is best treated by rest, plenty of fluids and control of the cough.  You may use Ibuprofen or Tylenol as directed to help your symptoms.    In addition you may use Albuterol inhaler 2 puffs every 4-6 hours as needed.     HOME CARE . Only take medications as instructed by your medical team. . Complete the entire course of an antibiotic. . Drink plenty of fluids and get plenty of rest. . Avoid close contacts especially the very young and the elderly . Cover your mouth if you cough or cough into your sleeve. . Always remember to wash your hands . A steam or ultrasonic humidifier can help congestion.    GET HELP RIGHT AWAY IF: . You develop worsening fever. . You become short of breath . You cough up blood. . Your symptoms persist after you have completed your treatment plan MAKE SURE YOU   Understand these instructions.  Will watch your condition.  Will get help right away if you are not doing well or get worse.  Your e-visit answers were reviewed by a board certified advanced clinical practitioner to complete your personal care plan.  Depending on the  condition, your plan could have included both over the counter or prescription medications. If there is a problem please reply  once you have received a response from your provider. Your safety is important to [place].  If you have drug allergies check your prescription carefully.    You can use MyChart to ask questions about today's visit, request a non-urgent call back, or ask for a work or school excuse for 24 hours related to this e-Visit. If it has been greater than 24 hours you will need to follow up with your provider, or enter a new e-Visit to address those concerns. You will get an e-mail in the next two days asking about your experience.  I hope that your e-visit has been valuable and will speed your recovery. Thank you for using e-visits.

## 2015-12-17 NOTE — Addendum Note (Signed)
Addended by: Chevis Pretty on: 12/17/2015 04:32 PM   Modules accepted: Orders

## 2015-12-18 ENCOUNTER — Encounter: Payer: Self-pay | Admitting: Physician Assistant

## 2015-12-18 ENCOUNTER — Ambulatory Visit (INDEPENDENT_AMBULATORY_CARE_PROVIDER_SITE_OTHER): Payer: 59 | Admitting: Physician Assistant

## 2015-12-18 VITALS — BP 135/78 | HR 67 | Ht 68.0 in | Wt 228.0 lb

## 2015-12-18 DIAGNOSIS — R05 Cough: Secondary | ICD-10-CM

## 2015-12-18 DIAGNOSIS — T148XXA Other injury of unspecified body region, initial encounter: Secondary | ICD-10-CM

## 2015-12-18 DIAGNOSIS — T148 Other injury of unspecified body region: Secondary | ICD-10-CM | POA: Diagnosis not present

## 2015-12-18 DIAGNOSIS — R059 Cough, unspecified: Secondary | ICD-10-CM

## 2015-12-18 LAB — CBC WITH DIFFERENTIAL/PLATELET
Basophils Absolute: 59 cells/uL (ref 0–200)
Basophils Relative: 1 %
EOS PCT: 3 %
Eosinophils Absolute: 177 cells/uL (ref 15–500)
HEMATOCRIT: 41.3 % (ref 35.0–45.0)
Hemoglobin: 14 g/dL (ref 11.7–15.5)
LYMPHS PCT: 25 %
Lymphs Abs: 1475 cells/uL (ref 850–3900)
MCH: 30.8 pg (ref 27.0–33.0)
MCHC: 33.9 g/dL (ref 32.0–36.0)
MCV: 91 fL (ref 80.0–100.0)
MONO ABS: 472 {cells}/uL (ref 200–950)
MPV: 10.6 fL (ref 7.5–12.5)
Monocytes Relative: 8 %
NEUTROS PCT: 63 %
Neutro Abs: 3717 cells/uL (ref 1500–7800)
Platelets: 288 10*3/uL (ref 140–400)
RBC: 4.54 MIL/uL (ref 3.80–5.10)
RDW: 12.8 % (ref 11.0–15.0)
WBC: 5.9 10*3/uL (ref 3.8–10.8)

## 2015-12-18 MED ORDER — PREDNISONE 20 MG PO TABS: ORAL_TABLET | ORAL | Status: DC

## 2015-12-18 MED ORDER — HYDROCOD POLST-CPM POLST ER 10-8 MG/5ML PO SUER: 5.0000 mL | Freq: Every evening | ORAL | Status: DC | PRN

## 2015-12-18 NOTE — Patient Instructions (Signed)

## 2015-12-18 NOTE — Progress Notes (Signed)
   Subjective:    Patient ID: Kathy Clark, female    DOB: 12-Feb-1961, 55 y.o.   MRN: [id number]  HPI  Patient is a 55 year old female who presents to the clinic with persistent cough. She denies any fever, chills, sinus pressure, ear pain, headache. She has had some sore throat and occasional wheezing. Her cough is mostly dry at this time. She's had the cough since 5/4 when she was diagnosed with acute bronchitis and given azithromycin and Tessalon Perles. She did get some better but cough persisted. She had another even visit on 6/5 and was given albuterol with Tessalon Perles. She has used albuterol a few times and has helped minimally with cough. She finds that the cough is worse at night.  Patient is also concerned about bruising all over body over the last month. She recently did a course of budesonide for her microcytic colitis. She wonders if this could have decreased her platelets. She has an appointment with her PCP next week.   Review of Systems  All other systems reviewed and are negative.      Objective:   Physical Exam  Constitutional: She is oriented to person, place, and time. She appears well-developed and well-nourished.  HENT:  Head: Normocephalic and atraumatic.  Right Ear: External ear normal.  Left Ear: External ear normal.  Nose: Nose normal.  Mouth/Throat: Oropharynx is clear and moist. No oropharyngeal exudate.  Eyes: Conjunctivae are normal. Right eye exhibits no discharge. Left eye exhibits no discharge.  Neck: Normal range of motion. Neck supple.  Cardiovascular: Normal rate, regular rhythm and normal heart sounds.   Pulmonary/Chest: Effort normal and breath sounds normal. She has no wheezes.  Lymphadenopathy:    She has no cervical adenopathy.  Neurological: She is alert and oriented to person, place, and time.  Skin:  brusing over bilateral arms and legs.   Psychiatric: She has a normal mood and affect. Her behavior is normal.           Assessment & Plan:  Cough-certainly feel like this represents a postviral cough syndrome. Went ahead and gave her a prednisone taper with Tussionex at bedtime. Hopefully this will help shut down her cough reflex and get her feeling better. Reassured her I saw no signs of infection. She can continue to use her albuterol inhaler if needed and helping with shortness of breath, cough or wheezing. Patient is on an ACE inhibitor. She's been on this ACE inhibitor for over a year. Although unlikely it still could be a potential aches cough. If symptoms not improving after completion of this therapy may need to consider switching her from an Ace to an ARB.   Bruising- went ahead and order CBC for PcP to review for next week. Stay off any NSAIDs.

## 2015-12-20 MED FILL — ESTRADIOL 0.1 MG PATCH: 0.1 | 84 days supply | Qty: 24 | Fill #2

## 2015-12-25 ENCOUNTER — Encounter: Payer: Self-pay | Admitting: Family Medicine

## 2015-12-25 ENCOUNTER — Ambulatory Visit (INDEPENDENT_AMBULATORY_CARE_PROVIDER_SITE_OTHER): Payer: 59 | Admitting: Family Medicine

## 2015-12-25 ENCOUNTER — Ambulatory Visit (INDEPENDENT_AMBULATORY_CARE_PROVIDER_SITE_OTHER): Payer: 59

## 2015-12-25 VITALS — BP 131/66 | HR 78 | Wt 230.0 lb

## 2015-12-25 DIAGNOSIS — R233 Spontaneous ecchymoses: Secondary | ICD-10-CM

## 2015-12-25 DIAGNOSIS — J209 Acute bronchitis, unspecified: Secondary | ICD-10-CM

## 2015-12-25 DIAGNOSIS — R238 Other skin changes: Secondary | ICD-10-CM | POA: Diagnosis not present

## 2015-12-25 DIAGNOSIS — R05 Cough: Secondary | ICD-10-CM

## 2015-12-25 DIAGNOSIS — R053 Chronic cough: Secondary | ICD-10-CM

## 2015-12-25 DIAGNOSIS — J40 Bronchitis, not specified as acute or chronic: Secondary | ICD-10-CM | POA: Diagnosis not present

## 2015-12-25 LAB — CBC WITH DIFFERENTIAL/PLATELET
BASOS PCT: 1 %
Basophils Absolute: 93 cells/uL (ref 0–200)
EOS ABS: 186 {cells}/uL (ref 15–500)
Eosinophils Relative: 2 %
HCT: 42.7 % (ref 35.0–45.0)
Hemoglobin: 14.4 g/dL (ref 11.7–15.5)
Lymphocytes Relative: 32 %
Lymphs Abs: 2976 cells/uL (ref 850–3900)
MCH: 31 pg (ref 27.0–33.0)
MCHC: 33.7 g/dL (ref 32.0–36.0)
MCV: 92 fL (ref 80.0–100.0)
MONOS PCT: 7 %
MPV: 10.1 fL (ref 7.5–12.5)
Monocytes Absolute: 651 cells/uL (ref 200–950)
NEUTROS ABS: 5394 {cells}/uL (ref 1500–7800)
Neutrophils Relative %: 58 %
PLATELETS: 350 10*3/uL (ref 140–400)
RBC: 4.64 MIL/uL (ref 3.80–5.10)
RDW: 13 % (ref 11.0–15.0)
WBC: 9.3 10*3/uL (ref 3.8–10.8)

## 2015-12-25 LAB — HEPATIC FUNCTION PANEL
ALT: 21 U/L (ref 6–29)
AST: 14 U/L (ref 10–35)
Albumin: 4.1 g/dL (ref 3.6–5.1)
Alkaline Phosphatase: 37 U/L (ref 33–130)
BILIRUBIN DIRECT: 0.1 mg/dL (ref <=0.2)
BILIRUBIN INDIRECT: 0.5 mg/dL (ref 0.2–1.2)
TOTAL PROTEIN: 6.6 g/dL (ref 6.1–8.1)
Total Bilirubin: 0.6 mg/dL (ref 0.2–1.2)

## 2015-12-25 NOTE — Progress Notes (Signed)
Subjective:    CC: C/O bruising.   HPI: Patient comes in complaining of easy bruising for at least a month. She says that even just her dog jumping up on her created huge bruises. She said she just wake up and have small 1 cm from places interbody without remembering any injury or trauma. She was on" for almost 2 months ago when she caught her GI he said it was unlikely to be the cause of her bruising. She's actually been off of it for almost a month now. She still just hasn't felt well. She feels extremely fatigued. Even over the weekend she had an episode of sweats and chills. She did have an upper respiratory infection about 6 weeks ago and has continued to have a cough that has lingered. The cough is productive but does not have any color. She still feels occasionally short of breath with it and will use an inhaler which she's never had to use in her lifetime. She did have a CBC done last week which was normal.  Past medical history, Surgical history, Family history not pertinant except as noted below, Social history, Allergies, and medications have been entered into the medical record, reviewed, and corrections made.   Review of Systems: No fevers, chills, night sweats, weight loss, chest pain, or shortness of breath.   Objective:    General: Well Developed, well nourished, and in no acute distress.  Neuro: Alert and oriented x3, extra-ocular muscles intact, sensation grossly intact.  HEENT: Normocephalic, atraumatic  Skin: Warm and dry, no rashes.Has some large bruises on both arms and lower legs. Cardiac: Regular rate and rhythm, no murmurs rubs or gallops, no lower extremity edema.  Respiratory: Clear to auscultation bilaterally. Not using accessory muscles, speaking in full sentences.   Impression and Recommendations:   Easy bruising-we'll do some additional labs today. She's not taking any medications or supplements such as fish oil generic which can cause bruising. Will check liver  enzymes as well. Platelet levels were normal.  Chronic cough-most likely postinfectious cough but we'll go ahead and get chest x-ray today for further evaluation. Lung exam is clear.

## 2015-12-26 ENCOUNTER — Other Ambulatory Visit: Payer: Self-pay | Admitting: Family Medicine

## 2015-12-26 DIAGNOSIS — R7989 Other specified abnormal findings of blood chemistry: Secondary | ICD-10-CM

## 2015-12-26 LAB — TSH: TSH: 3.1 mIU/L

## 2015-12-26 LAB — FERRITIN: Ferritin: 142 ng/mL (ref 10–232)

## 2015-12-26 LAB — VITAMIN D 25 HYDROXY (VIT D DEFICIENCY, FRACTURES): VIT D 25 HYDROXY: 28 ng/mL — AB (ref 30–100)

## 2015-12-26 LAB — VITAMIN B12: VITAMIN B 12: 648 pg/mL (ref 200–1100)

## 2016-01-10 ENCOUNTER — Telehealth: Payer: 59 | Admitting: Family

## 2016-01-10 DIAGNOSIS — L739 Follicular disorder, unspecified: Secondary | ICD-10-CM

## 2016-01-10 DIAGNOSIS — L089 Local infection of the skin and subcutaneous tissue, unspecified: Secondary | ICD-10-CM | POA: Diagnosis not present

## 2016-01-10 MED ORDER — DOXYCYCLINE HYCLATE 100 MG PO TABS: 100.0000 mg | ORAL_TABLET | Freq: Two times a day (BID) | ORAL | Status: DC

## 2016-01-10 MED ORDER — FLUCONAZOLE 150 MG PO TABS: 150.0000 mg | ORAL_TABLET | Freq: Once | ORAL | Status: DC

## 2016-01-10 NOTE — Progress Notes (Signed)
This e visit has already been addressed but was nit closed

## 2016-01-10 NOTE — Addendum Note (Signed)
Addended by: Benjamine Mola on: 01/10/2016 04:37 PM   Modules accepted: Orders, Medications

## 2016-01-10 NOTE — Progress Notes (Signed)
E Visit for Rash  We are sorry that you are not feeling well. Here is how we plan to help!  Doxycycline 100 mg twice per day for 7 days   HOME CARE:   Take cool showers and avoid direct sunlight.  Apply cool compress or wet dressings.  Take a bath in an oatmeal bath.  Sprinkle content of one Aveeno packet under running faucet with comfortably warm water.  Bathe for 15-20 minutes, 1-2 times daily.  Pat dry with a towel. Do not rub the rash.  Use hydrocortisone cream.  Take an antihistamine like Benadryl for widespread rashes that itch.  The adult dose of Benadryl is 25-50 mg by mouth 4 times daily.  Caution:  This type of medication may cause sleepiness.  Do not drink alcohol, drive, or operate dangerous machinery while taking antihistamines.  Do not take these medications if you have prostate enlargement.  Read package instructions thoroughly on all medications that you take.  GET HELP RIGHT AWAY IF:   Symptoms don't go away after treatment.  Severe itching that persists.  If you rash spreads or swells.  If you rash begins to smell.  If it blisters and opens or develops a yellow-brown crust.  You develop a fever.  You have a sore throat.  You become short of breath.  MAKE SURE YOU:  Understand these instructions. Will watch your condition. Will get help right away if you are not doing well or get worse.  Thank you for choosing an e-visit. Your e-visit answers were reviewed by a board certified advanced clinical practitioner to complete your personal care plan. Depending upon the condition, your plan could have included both over the counter or prescription medications. Please review your pharmacy choice. Be sure that the pharmacy you have chosen is open so that you can pick up your prescription now.  If there is a problem you may message your provider in MyChart to have the prescription routed to another pharmacy. Your safety is important to us. If you have drug  allergies check your prescription carefully.  For the next 24 hours, you can use MyChart to ask questions about today's visit, request a non-urgent call back, or ask for a work or school excuse from your e-visit provider. You will get an email in the next two days asking about your experience. I hope that your e-visit has been valuable and will speed your recovery.      

## 2016-01-21 ENCOUNTER — Encounter: Payer: Self-pay | Admitting: Family Medicine

## 2016-01-21 ENCOUNTER — Ambulatory Visit (INDEPENDENT_AMBULATORY_CARE_PROVIDER_SITE_OTHER): Payer: 59 | Admitting: Family Medicine

## 2016-01-21 VITALS — BP 122/72 | HR 71 | Wt 232.0 lb

## 2016-01-21 DIAGNOSIS — R053 Chronic cough: Secondary | ICD-10-CM

## 2016-01-21 DIAGNOSIS — R05 Cough: Secondary | ICD-10-CM | POA: Diagnosis not present

## 2016-01-21 DIAGNOSIS — F324 Major depressive disorder, single episode, in partial remission: Secondary | ICD-10-CM | POA: Diagnosis not present

## 2016-01-21 MED ORDER — LOSARTAN POTASSIUM-HCTZ 50-12.5 MG PO TABS: 1.0000 | ORAL_TABLET | Freq: Every day | ORAL | Status: DC

## 2016-01-21 MED ORDER — OMEPRAZOLE 20 MG PO TBEC: 1.0000 | DELAYED_RELEASE_TABLET | Freq: Every day | ORAL | Status: DC

## 2016-01-21 MED FILL — LOSARTAN-HCTZ 50-12.5 MG TA: 50-12.5 | 90 days supply | Qty: 90 | Fill #0

## 2016-01-21 MED FILL — OMEPRAZOLE DR 20 MG CAPSULE: 20 | 30 days supply | Qty: 30 | Fill #0

## 2016-01-21 NOTE — Progress Notes (Signed)
Subjective:    CC: mood  HPI:  Patient comes in today to follow-up for rash and. She has gone to 16 sessions of therapy through [employer] at work and has found that very helpful. She is also currently on fluoxetine 40 mg and feels like it's working well overall. She still struggles with sleep. That this is a chronic issue. She denies feeling down depressed or hopeless.  Patient also complains of continued chronic cough. She was complaining of this when I last saw her. We did a chest x-ray which was negative. She continues to have a dry tickly almost cottonmouth feeling cough. She says it seems to be worse after she eats and when she lays down at night. She said one night she actually felt like she heard some expiratory wheezing. She says that her mother did develop asthma in her 10s.  Past medical history, Surgical history, Family history not pertinant except as noted below, Social history, Allergies, and medications have been entered into the medical record, reviewed, and corrections made.   Review of Systems: No fevers, chills, night sweats, weight loss, chest pain, or shortness of breath.   Objective:    General: Well Developed, well nourished, and in no acute distress.  Neuro: Alert and oriented x3, extra-ocular muscles intact, sensation grossly intact.  HEENT: Normocephalic, atraumatic  Skin: Warm and dry, no rashes. Cardiac: Regular rate and rhythm, no murmurs rubs or gallops, no lower extremity edema.  Respiratory: Clear to auscultation bilaterally. Not using accessory muscles, speaking in full sentences.   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 Follow-up in 4 months. PHQ- 9 is negative.    Chronic cough-we will discontinue her ACE inhibitor and switch her to an ARB. Were also have her try a PPI for 2 weeks. If not improving at that point and have her come in for spirometry for further evaluation.

## 2016-01-23 ENCOUNTER — Telehealth: Payer: 59 | Admitting: Physician Assistant

## 2016-01-23 NOTE — Progress Notes (Signed)
We are sorry that you are not feeling well.  Here is how we plan to help!  Based on what you have shared with me it does look like you have a viral infection.    Most cold sores or fever blisters are small fluid filled blisters around the mouth caused by herpes simplex virus.  The most common strain of the virus causing cold sores is herpes simplex virus 1.  It can be spread by skin contact, sharing eating utensils, or even sharing towels.  Cold sores are contagious to other people until dry. (Approximately 5-7 days).  Wash your hands. You can spread the virus to your eyes through handling your contact lenses after touching the lesions.  Most people experience pain at the sight or tingling sensations in their lips that may begin before the ulcers erupt.  Herpes simplex is treatable but not curable.  It may lie dormant for a long time and then reappear due to stress or prolonged sun exposure.  Many patients have success in treating their cold sores with an over the counter topical called Abreva.  You may apply the cream up to 5 times daily (maximum 10 days) until healing occurs.  If you would like to use an oral antiviral medication to speed the healing of your cold sore, I would like to send a prescription to your local pharmacy Valacyclovir 2 gm twice daily for 1 day . You listed [facility] as pharmacy but you did not give me a location. I need more specific details on where to send the medication.   HOME CARE:   Wash your hands frequently.  Do not pick at or rub the sore.  Don't open the blisters.  Avoid kissing other people during this time.  Avoid sharing drinking glasses, eating utensils, or razors.  Do not handle contact lenses unless you have thoroughly washed your hands with soap and warm water!  Avoid oral sex during this time.  Herpes from sores on your mouth can spread to your partner's genital area.  Avoid contact with anyone who has eczema or a weakened immune system.  Cold  sores are often triggered by exposure to intense sunlight, use a lip balm containing a sunscreen (SPF 30 or higher).  GET HELP RIGHT AWAY IF:   Blisters look infected.  Blisters occur near or in the eye.  Symptoms last longer than 10 days.  Your symptoms become worse.  MAKE SURE YOU:   Understand these instructions.  Will watch your condition.  Will get help right away if you are not doing well or get worse.    Your e-visit answers were reviewed by a board certified advanced clinical practitioner to complete your personal care plan.  Depending upon the condition, your plan could have  Included both over the counter or prescription medications.    Please review your pharmacy choice.  Be sure that the pharmacy you have chosen is open so that you can pick up your prescription now.  If there is a problem you csn message your provider in [place] to have the prescription routed to another pharmacy.    Your safety is important to [place].  If you have drug allergies check our prescription carefully.  For the next 24 hours you can use MyChart to ask questions about today's visit, request a non-urgent call back, or ask for a work or school excuse from your e-visit provider.  You will get an email in the next two days asking about your experience.  I hope that your e-visit has been valuable and will speed your recovery.

## 2016-01-23 NOTE — Progress Notes (Signed)
Based on what you shared with me it looks like you have a serious condition that should be evaluated in a face to face office visit. I am concerned giving length of symptoms and high fever that you may have pneumonia. As such you need a detailed physical examination and a chest x-ray for further assessment. Other concerns with such a high fever is that the infection has spread to blood stream. I want you to call your primary care provider office to see if they can fit you in this afternoon. If not, I want you to go directly to an Urgent Care or ER for assessment.   If you are having a true medical emergency please call 911.  If you need an urgent face to face visit, [facility] has four urgent care centers for your convenience.  If you need care fast and have a high deductible or no insurance consider:   [email or web address]  [phone]  [address] [place], [place] [address] 8 am to 8 pm Monday-Friday 10 am to 4 pm Saturday-Sunday   The following sites will take your  insurance:    . [facility] [place] a Provider at this Location  [address] [place], [place] [address] . 10 am to 8 pm Monday-Friday . 12 pm to 8 pm Saturday-Sunday   . [facility] at [place] a Provider at this Location  [address], [place], [place] [address] . 8 am to 8 pm Monday-Friday . 9 am to 6 pm Saturday . 11 am to 6 pm Sunday   . [facility] at [place] Get Driving Directions  [id number] [place], [place] [address] . 8 am to 8 pm Monday-Friday . 8 am to 4 pm Saturday-Sunday   . [facility] (a walk in primary care provider)  [place] a Provider at this Location  [place], [place] [address] . 8 am to 8:30 pm  Monday-Thursday . 8 am to 6 pm Friday . 8 am to 4 pm Saturday-Sunday   Your e-visit answers were reviewed by a board certified advanced clinical practitioner to complete your personal care plan.  Thank you for using e-Visits.

## 2016-01-28 ENCOUNTER — Other Ambulatory Visit: Payer: Self-pay | Admitting: Family Medicine

## 2016-01-28 MED FILL — SUMAtriptan 20 MG/ACT SOLN: 20 | 30 days supply | Qty: 6 | Fill #0

## 2016-01-30 ENCOUNTER — Telehealth: Payer: Self-pay | Admitting: *Deleted

## 2016-01-30 ENCOUNTER — Encounter: Payer: Self-pay | Admitting: Family Medicine

## 2016-01-30 DIAGNOSIS — M79675 Pain in left toe(s): Secondary | ICD-10-CM

## 2016-01-30 NOTE — Telephone Encounter (Signed)
This encounter was created in error - please disregard.

## 2016-01-30 NOTE — Progress Notes (Signed)
Encounter is erroneous. Please disregard. 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  This encounter should not be included in her medical record.

## 2016-01-30 NOTE — Addendum Note (Signed)
Addended by: Raiford Noble on: 01/30/2016 09:34 AM   Modules accepted: Level of Service, SmartSet

## 2016-01-30 NOTE — Addendum Note (Signed)
Addended by: Raiford Noble on: 01/30/2016 09:33 AM   Modules accepted: Level of Service, SmartSet

## 2016-02-03 ENCOUNTER — Ambulatory Visit (INDEPENDENT_AMBULATORY_CARE_PROVIDER_SITE_OTHER): Payer: 59 | Admitting: Podiatry

## 2016-02-03 ENCOUNTER — Encounter: Payer: Self-pay | Admitting: Podiatry

## 2016-02-03 DIAGNOSIS — M21619 Bunion of unspecified foot: Secondary | ICD-10-CM | POA: Diagnosis not present

## 2016-02-03 DIAGNOSIS — M79673 Pain in unspecified foot: Secondary | ICD-10-CM | POA: Diagnosis not present

## 2016-02-03 DIAGNOSIS — M79605 Pain in left leg: Secondary | ICD-10-CM

## 2016-02-03 NOTE — Progress Notes (Signed)
SUBJECTIVE: 55 y.o. year old female presents complaining of painful bunion left foot for duration of 2 months. Bone is protruding and hurts in closed in shoes. Patient is referred by Dr. [name].   REVIEW OF SYSTEMS: A comprehensive review of systems was negative except for: Hypertension and Left knee surgery in 2013.  Otherwise patient is in good health and in good spirit. 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   OBJECTIVE: DERMATOLOGIC EXAMINATION: No abnormal skin lesions.   VASCULAR EXAMINATION OF LOWER LIMBS: Pedal pulses: All pedal pulses are palpable with normal pulsation.  Temperature gradient from tibial crest to dorsum of foot is within normal bilateral.  NEUROLOGIC EXAMINATION OF THE LOWER LIMBS: All epicritic and tactile sensations are grossly intact.   MUSCULOSKELETAL EXAMINATION: Positive for large bunion left foot with pain. High arched cavus type foot.   ASSESSMENT: Painful bunion left foot.   PLAN: Reviewed clinical findings and available treatment options. Will need weight bearing x-ray to determine type of bunion procedure. Patient will return this Friday to have x-rays done.

## 2016-02-03 NOTE — Patient Instructions (Signed)
Seen for painful bunion left foot. Will do weight bearing x-ray and discuss possible surgical options. Return this Friday.

## 2016-02-07 ENCOUNTER — Ambulatory Visit (INDEPENDENT_AMBULATORY_CARE_PROVIDER_SITE_OTHER): Payer: 59 | Admitting: Podiatry

## 2016-02-07 ENCOUNTER — Encounter: Payer: Self-pay | Admitting: Podiatry

## 2016-02-07 DIAGNOSIS — M79605 Pain in left leg: Secondary | ICD-10-CM

## 2016-02-07 DIAGNOSIS — M79673 Pain in unspecified foot: Secondary | ICD-10-CM | POA: Diagnosis not present

## 2016-02-07 DIAGNOSIS — M21619 Bunion of unspecified foot: Secondary | ICD-10-CM | POA: Diagnosis not present

## 2016-02-07 NOTE — Progress Notes (Signed)
Patient came in to have the X-ray done on both feet for painful bunion. Radiographic examination reveal adductus foot with enlarged medial eminence of the first metatarsal head. The first inter metatarsal angle is 12 on both feet with Fibular sesamoid position at 4 bilateral. Possible Austin bunionectomy on left foot reviewed.

## 2016-02-07 NOTE — Patient Instructions (Signed)
Reviewed X-ray findings and possible Austin bunionectomy on left foot.

## 2016-02-18 MED FILL — OMEPRAZOLE DR 20 MG CAPSULE: 20 | 30 days supply | Qty: 30 | Fill #1

## 2016-02-18 MED FILL — GABAPENTIN 300 MG CAPSULE: 300 | 90 days supply | Qty: 180 | Fill #1

## 2016-02-19 ENCOUNTER — Other Ambulatory Visit: Payer: Self-pay | Admitting: *Deleted

## 2016-02-19 MED ORDER — FLUOXETINE HCL 40 MG PO CAPS: 40.0000 mg | ORAL_CAPSULE | Freq: Every day | ORAL | 0 refills | Status: DC

## 2016-02-20 ENCOUNTER — Ambulatory Visit (INDEPENDENT_AMBULATORY_CARE_PROVIDER_SITE_OTHER): Payer: 59 | Admitting: Osteopathic Medicine

## 2016-02-20 ENCOUNTER — Encounter: Payer: Self-pay | Admitting: Osteopathic Medicine

## 2016-02-20 VITALS — BP 120/76 | HR 77 | Ht 68.0 in | Wt 235.0 lb

## 2016-02-20 DIAGNOSIS — F418 Other specified anxiety disorders: Secondary | ICD-10-CM

## 2016-02-20 DIAGNOSIS — I10 Essential (primary) hypertension: Secondary | ICD-10-CM

## 2016-02-20 MED ORDER — HYDROCHLOROTHIAZIDE 25 MG PO TABS
25.0000 mg | ORAL_TABLET | Freq: Every day | ORAL | 1 refills | Status: DC
Start: 2016-02-20 — End: 2016-07-08

## 2016-02-20 MED ORDER — FLUOXETINE HCL 20 MG PO CAPS: 20.0000 mg | ORAL_CAPSULE | Freq: Every day | ORAL | 0 refills | Status: DC

## 2016-02-20 MED FILL — FLUoxetine HCL 20 MG CAPS: 20 | 90 days supply | Qty: 90 | Fill #0

## 2016-02-20 MED FILL — HYDROCHLOROTHIAZIDE 25 MG T: 25 | 30 days supply | Qty: 30 | Fill #0

## 2016-02-20 NOTE — Telephone Encounter (Signed)
closed

## 2016-02-20 NOTE — Progress Notes (Signed)
HPI: Kathy Clark is a 55 y.o. female  who presents to [facility] today, 02/20/16,  for chief complaint of:  Chief Complaint  Patient presents with  . Edema    Patient started a new blood pressure medication and its causing her to have swelling    Lower extremities swelling: . Context: One month ago patient was switched from ACE inhibitor to angiotensin receptor blocker due to concern for coughing. Both medications have been in combination with HCTZ 12.5 mg. Cough has gotten better but lower extremity swelling seems to be an issue for her. . Location: Bilateral lower extremities . Quality: Puffiness/tightness feeling in the feet and occasionally in the hands as well, no pitting edema . Timing: Worse in the evenings . 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 She states her situation which was making her depressed/anxious has improved as well.    Past medical, surgical, social and family history reviewed: Past Medical History:  Diagnosis Date  . 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 Essential hypertension, benign   . Lymphocytic colitis    microscopic  . Migraines   . Osteoarthritis    Past Surgical History:  Procedure Laterality Date  . ABDOMINAL HYSTERECTOMY  07-2005  . BACK SURGERY    . KNEE SURGERY  2006   left; multiple  . microdiscetomy  2005  . SHOULDER SURGERY  07-22-09   right  . SKIN CANCER EXCISION     eyelid  . TOTAL KNEE ARTHROPLASTY  07/27/2011   Procedure: TOTAL KNEE ARTHROPLASTY;  Surgeon: Gearlean Alf;  Location: [facility];   Service: Orthopedics;  Laterality: Left;   Social History  Substance Use Topics  . Smoking status: Never Smoker  . Smokeless tobacco: Never Used  . Alcohol use Yes     Comment: occasionally   Family History  Problem Relation Age of Onset  . Breast cancer    . Tongue cancer Mother   . Breast cancer Mother   . Colon cancer Neg Hx   . Heart disease Father   . Stroke Paternal Grandmother      Current medication list and allergy/intolerance information reviewed:   Current Outpatient Prescriptions  Medication Sig Dispense Refill  . albuterol (PROVENTIL HFA;VENTOLIN HFA) 108 (90 Base) MCG/ACT inhaler Inhale 2 puffs into the lungs every 6 (six) hours as needed for wheezing or shortness of breath. 1 Inhaler 0  . budesonide (ENTOCORT EC) 3 MG 24 hr capsule   2  . Calcium Carbonate-Vitamin D (CALCIUM 600 + D PO) Take by mouth. 1 TWICE DAILY    . estradiol (VIVELLE-DOT) 0.1 MG/24HR patch Place 1 patch onto the skin 2 (two) times a week.    [name] FLUoxetine (PROZAC) 40 MG capsule Take 1 capsule (40 mg total) by mouth daily. 90 capsule 0  . gabapentin (NEURONTIN) 300 MG capsule Take 300 mg by mouth 2 (two) times daily.     . hyoscyamine (LEVSIN/SL) 0.125 MG SL tablet Place 1 tablet (0.125 mg total) under the tongue every 4 (four)  hours as needed. 30 tablet 2  . losartan-hydrochlorothiazide (HYZAAR) 50-12.5 MG tablet Take 1 tablet by mouth daily. 90 tablet 3  . Melatonin 3 MG TABS Take by mouth as directed.    . Omeprazole 20 MG TBEC Take 1 tablet (20 mg total) by mouth daily before breakfast. 30 each 1  . SUMAtriptan (IMITREX) 20 MG/ACT nasal spray PLACE 1 SPRAY INTO THE NOSE EVERY 2 HOURS AS NEEDED FOR. HEADACHE 6 Inhaler 2   No current facility-administered medications for this visit.    Allergies  Allergen Reactions  . Succinylcholine Anaphylaxis  . Dilaudid [Hydromorphone Hcl] Nausea And Vomiting      Review of Systems:  Constitutional:  No unintentional weight changes. No significant  fatigue.    HEENT: No  headache, no vision change,  Cardiac: No  chest pain, No  pressure, No palpitations, No  Orthopnea, (+) Le swelling as above  Respiratory:  No  shortness of breath. No  Cough  Musculoskeletal: No new myalgia/arthralgia  Neurologic: No  weakness, No  dizziness,  Psychiatric: No  concerns with depression, No  concerns with anxiety, No sleep problems, No mood problems, (+) lack of motivation  Exam:  BP 120/76   Pulse 77   Ht 5\' 8"  (1.727 m)   Wt 235 lb (106.6 kg)   BMI 35.73 kg/m   Constitutional: VS see above. General Appearance: alert, well-developed, well-nourished, NAD  Ears, Nose, Mouth, Throat: MMM, Normal external inspection ears/nares/mouth/lips/gums.   Neck: No masses, trachea midline.   Respiratory: Normal respiratory effort. no wheeze, no rhonchi, no rales  Cardiovascular: S1/S2 normal, no murmur, no rub/gallop auscultated. RRR. No lower extremity edema, though trace impressions on skin of feet from shoe straps. Pedal pulse II/IV bilaterally DP and PT. No JVD.  Skin: warm, dry, intact. No rash/ulcer.   Psychiatric: Normal judgment/insight. Normal mood and affect. Oriented x3.     ASSESSMENT/PLAN:   Can switch over to HCTZ 25, discontinue the eighth/R since no compelling indication such as diabetes. Would recommend follow-up with PCP on this issue, standing order placed for recheck BMP to monitor kidney function in the next 4-6 weeks on increased dose diuretic  Refill for fluoxetine 20 mg rather than 40 mg. And, follow-up with PCP on this issue  Essential hypertension - Plan: hydrochlorothiazide (HYDRODIURIL) 25 MG tablet, BASIC METABOLIC PANEL WITH GFR  Anxious depression - Plan: FLUoxetine (PROZAC) 20 MG capsule     Visit summary with medication list and pertinent instructions was printed for patient to review. All questions at time of visit were answered - patient instructed to contact office with any additional concerns. ER/RTC  precautions were reviewed with the patient. Follow-up plan: Return in about 2 months (around 04/21/2016), or sooner if needed, for HTN RECHECK WITH PCP.  Note: Total time spent 25 minutes, greater than 50% of the visit was spent face-to-face counseling and coordinating care for the following: The primary encounter diagnosis was Essential hypertension. A diagnosis of Anxious depression was also pertinent to this visit.[name]

## 2016-02-27 DIAGNOSIS — Z6835 Body mass index (BMI) 35.0-35.9, adult: Secondary | ICD-10-CM | POA: Diagnosis not present

## 2016-02-27 DIAGNOSIS — Z01419 Encounter for gynecological examination (general) (routine) without abnormal findings: Secondary | ICD-10-CM | POA: Diagnosis not present

## 2016-03-02 DIAGNOSIS — Z96652 Presence of left artificial knee joint: Secondary | ICD-10-CM | POA: Diagnosis not present

## 2016-03-02 DIAGNOSIS — Z471 Aftercare following joint replacement surgery: Secondary | ICD-10-CM | POA: Diagnosis not present

## 2016-03-19 MED FILL — HYDROCHLOROTHIAZIDE 25 MG T: 25 | 30 days supply | Qty: 30 | Fill #1

## 2016-03-23 MED FILL — ESTRADIOL 0.075 MG PATCH: 0.075 | 84 days supply | Qty: 24 | Fill #0

## 2016-03-26 ENCOUNTER — Ambulatory Visit (INDEPENDENT_AMBULATORY_CARE_PROVIDER_SITE_OTHER): Payer: 59 | Admitting: Family Medicine

## 2016-03-26 ENCOUNTER — Encounter: Payer: Self-pay | Admitting: Family Medicine

## 2016-03-26 VITALS — BP 136/78 | HR 78 | Wt 237.0 lb

## 2016-03-26 DIAGNOSIS — M7989 Other specified soft tissue disorders: Secondary | ICD-10-CM | POA: Diagnosis not present

## 2016-03-26 DIAGNOSIS — F418 Other specified anxiety disorders: Secondary | ICD-10-CM | POA: Diagnosis not present

## 2016-03-26 NOTE — Progress Notes (Signed)
Subjective:    CC: Mood  HPI: F/U anxious/depression - Recently went up to fluoxetine to 40 mg. She actually felt like she was more apathetic andDidn't care about much of anything. She forgot to pay some bills.  After meeting with one of the other providers in our office they decided to go back down to 20 mg but now she is feeling more sad and tearful. She wanted to discuss what to do with the medication.    Her hormone replacement was bumped back down to 0.75 for the Vivelle-Dot she was getting some breast tenderness on the increased dose.  She also complains of bilateral lower actually swelling. It's worse on her left foot that that's also the leg that she had knee surgery on. He says it's gotten noticeably worse over the last month or 2. No trauma or injury per se. Her HCTZ was increased to 25 mg to see if it was helping. She said she really didn't notice a big difference.  Past medical history, Surgical history, Family history not pertinant except as noted below, Social history, Allergies, and medications have been entered into the medical record, reviewed, and corrections made.   Review of Systems: No fevers, chills, night sweats, weight loss, chest pain, or shortness of breath.   Objective:    General: Well Developed, well nourished, and in no acute distress.  Neuro: Alert and oriented x3, extra-ocular muscles intact, sensation grossly intact.  HEENT: Normocephalic, atraumatic  Skin: Warm and dry, no rashes. Cardiac: Regular rate and rhythm, no murmurs rubs or gallops, She has 1+ pitting edema on the top of her left foot to her left ankle and trace edema in her right ankle with just some slightly less pitting edema on the top of the right foot.  Respiratory: Clear to auscultation bilaterally. Not using accessory muscles, speaking in full sentences.   Impression and Recommendations:    Anxious/depression-Not well controlled.  Discussed several options. We can consider pushing of the  Prozac to 30 mg which might be happy medium between the 20 mg were she still feels empty and tearful versus 40 mg. She thinks she actually like to try going back up to the 40 mg for about 3 weeks first. She thinks she has an a tabs at home to do this in the [place] at that point in time to see if she wants to stick with 40 mg or try the 30 mg dose. Follow-up in 3 months.  Bilateral lower extremity edema-most consistent with venous stasis. We will do some lab work just to rule out electrolytes disturbance, thyroid disorder, pertinent area etc. as causes. If these are normal then recommend a trial of compression stockings.

## 2016-03-26 NOTE — Patient Instructions (Addendum)
Increase fluoxetine to 40mg .

## 2016-03-27 LAB — CBC WITH DIFFERENTIAL/PLATELET
BASOS ABS: 0 {cells}/uL (ref 0–200)
Basophils Relative: 0 %
EOS ABS: 201 {cells}/uL (ref 15–500)
Eosinophils Relative: 3 %
HCT: 42.6 % (ref 35.0–45.0)
HEMOGLOBIN: 14.3 g/dL (ref 11.7–15.5)
LYMPHS ABS: 1608 {cells}/uL (ref 850–3900)
Lymphocytes Relative: 24 %
MCH: 30.6 pg (ref 27.0–33.0)
MCHC: 33.6 g/dL (ref 32.0–36.0)
MCV: 91 fL (ref 80.0–100.0)
MONOS PCT: 7 %
MPV: 11.4 fL (ref 7.5–12.5)
Monocytes Absolute: 469 cells/uL (ref 200–950)
NEUTROS ABS: 4422 {cells}/uL (ref 1500–7800)
NEUTROS PCT: 66 %
Platelets: 287 10*3/uL (ref 140–400)
RBC: 4.68 MIL/uL (ref 3.80–5.10)
RDW: 13 % (ref 11.0–15.0)
WBC: 6.7 10*3/uL (ref 3.8–10.8)

## 2016-03-27 LAB — URINALYSIS, ROUTINE W REFLEX MICROSCOPIC
BILIRUBIN URINE: NEGATIVE
GLUCOSE, UA: NEGATIVE
Hgb urine dipstick: NEGATIVE
Ketones, ur: NEGATIVE
Leukocytes, UA: NEGATIVE
Nitrite: NEGATIVE
PH: 5.5 (ref 5.0–8.0)
Protein, ur: NEGATIVE
SPECIFIC GRAVITY, URINE: 1.01 (ref 1.001–1.035)

## 2016-03-27 LAB — BASIC METABOLIC PANEL WITH GFR
BUN: 15 mg/dL (ref 7–25)
CHLORIDE: 100 mmol/L (ref 98–110)
CO2: 29 mmol/L (ref 20–31)
Calcium: 9.4 mg/dL (ref 8.6–10.4)
Creat: 0.61 mg/dL (ref 0.50–1.05)
GFR, Est African American: >89 mL/min (ref >=60)
GFR, Est Non African American: >89 mL/min (ref >=60)
GLUCOSE: 79 mg/dL (ref 65–99)
POTASSIUM: 4 mmol/L (ref 3.5–5.3)
Sodium: 138 mmol/L (ref 135–146)

## 2016-03-27 LAB — SEDIMENTATION RATE: SED RATE: 4 mm/h (ref 0–30)

## 2016-03-27 LAB — VITAMIN D 25 HYDROXY (VIT D DEFICIENCY, FRACTURES): Vit D, 25-Hydroxy: 35 ng/mL (ref 30–100)

## 2016-03-27 LAB — C-REACTIVE PROTEIN: CRP: 2 mg/L (ref <8.0)

## 2016-03-27 LAB — TSH: TSH: 2.09 m[IU]/L

## 2016-04-03 ENCOUNTER — Encounter: Payer: Self-pay | Admitting: Family Medicine

## 2016-04-06 ENCOUNTER — Ambulatory Visit (INDEPENDENT_AMBULATORY_CARE_PROVIDER_SITE_OTHER): Payer: 59 | Admitting: Sports Medicine

## 2016-04-06 DIAGNOSIS — G5603 Carpal tunnel syndrome, bilateral upper limbs: Secondary | ICD-10-CM | POA: Diagnosis not present

## 2016-04-06 DIAGNOSIS — M7712 Lateral epicondylitis, left elbow: Secondary | ICD-10-CM | POA: Diagnosis not present

## 2016-04-06 NOTE — Progress Notes (Signed)
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  Past medical history:  Negative.  See flowsheet/record as well for more information.  Surgical history: Negative.  See flowsheet/record as well for more information.  Family history: Negative.  See flowsheet/record as well for more information.  Social history: Negative.  See flowsheet/record as well for more information.  Allergies, and medications have been entered into the medical record, reviewed, and no changes needed.   Review of Systems: No fevers, chills, night sweats, weight loss, chest pain, or shortness of breath.   Objective:    General: Well Developed, well nourished, and in no acute distress.  Neuro: Alert and oriented x3, extra-ocular muscles intact, sensation grossly intact.  HEENT: Normocephalic, atraumatic, pupils equal round reactive to light, neck supple, no masses, no lymphadenopathy, thyroid nonpalpable.  Skin: Warm and dry, no rashes. Cardiac: Regular rate and rhythm, no murmurs rubs or gallops, no lower extremity edema.  Respiratory: Clear to auscultation bilaterally. Not using accessory muscles, speaking in full sentences. Left Elbow: Unremarkable to inspection. Range of motion full pronation, supination, flexion, extension. Strength is full to all of the above directions Stable to varus, valgus stress. Negative moving valgus stress test. Tender to palpation of the common extensor tendon origin Ulnar nerve does not sublux. Negative cubital tunnel  Tinel's.  Procedure: Real-time Ultrasound Guided Injection of left median nerve hydrodissection Device: GE Logiq E  Verbal informed consent obtained.  Time-out conducted.  Noted no overlying erythema, induration, or other signs of local infection.  Skin prepped in a sterile fashion.  Local anesthesia: Topical Ethyl chloride.  With sterile technique and under real time ultrasound guidance:  Medication injected both superficial to and deep to the median nerve at the carpal tunnel freeing it from surrounding structures, the needle was then redirected deep into the carpal tunnel for a total of 1 mL kenalog 40, 5 mL lidocaine. Completed without difficulty  Pain immediately resolved suggesting accurate placement of the medication.  Advised to call if fevers/chills, erythema, induration, drainage, or persistent bleeding.  Images permanently stored and available for review in the ultrasound unit.  Impression: Technically successful ultrasound guided injection.  Procedure: Real-time Ultrasound Guided Injection of right median nerve hydrodissection Device: GE Logiq E  Verbal informed consent obtained.  Time-out conducted.  Noted no overlying erythema, induration, or other signs of local infection.  Skin prepped in a sterile fashion.  Local anesthesia: Topical Ethyl chloride.  With sterile technique and under real time ultrasound guidance:  Medication injected both superficial to and deep to the median nerve at the carpal tunnel freeing it from surrounding structures, the needle was then redirected deep into the carpal tunnel for a total of 1 mL kenalog 40, 5 mL lidocaine. Completed without difficulty  Pain immediately resolved suggesting accurate placement of the medication.  Advised to call if fevers/chills, erythema, induration, drainage, or persistent bleeding.  Images permanently stored and available for review in the ultrasound unit.  Impression: Technically successful ultrasound guided  injection.  Impression and Recommendations:    Bilateral  carpal tunnel syndrome Over one year response to previous injection, repeat bilateral median nerve hydrodissection as above, new Velcro wrist brace given.  Left tennis elbow Rehabilitation exercises given.

## 2016-04-06 NOTE — Assessment & Plan Note (Signed)
Rehabilitation exercises given.

## 2016-04-06 NOTE — Assessment & Plan Note (Signed)
Over one year response to previous injection, repeat bilateral median nerve hydrodissection as above, new Velcro wrist brace given.

## 2016-04-07 MED ORDER — FLUOXETINE HCL 20 MG PO TABS: 30.0000 mg | ORAL_TABLET | Freq: Every day | ORAL | 1 refills | Status: DC

## 2016-04-07 MED FILL — FLUoxetine HCL 20 MG TABS: 20 | 90 days supply | Qty: 135 | Fill #0

## 2016-04-21 ENCOUNTER — Telehealth: Payer: Self-pay | Admitting: Gastroenterology

## 2016-04-21 MED ORDER — HYDROCORTISONE ACETATE 25 MG RE SUPP: RECTAL | 1 refills | Status: DC

## 2016-04-21 MED FILL — ANUCORT-HC 25 MG SUPP: 25 | 14 days supply | Qty: 24 | Fill #0

## 2016-04-21 NOTE — Telephone Encounter (Signed)
Patient reports rectal pain.  She has been using OTC prep H and sitx baths with minimal relief.  She reports that she can feel a pea sized "bulge" when she cleans herself.  She is requesting hydrocortisone suppositories that Dr. Olevia Perches has prescribed in the past. Ok to send in [place]?

## 2016-04-21 NOTE — Telephone Encounter (Signed)
Patient notified rx sent 

## 2016-04-21 NOTE — Telephone Encounter (Signed)
Yes Anusol HC or similar bid prn

## 2016-04-29 ENCOUNTER — Telehealth: Payer: 59 | Admitting: Family

## 2016-04-29 DIAGNOSIS — T22012A Burn of unspecified degree of left forearm, initial encounter: Secondary | ICD-10-CM

## 2016-04-29 DIAGNOSIS — T300 Burn of unspecified body region, unspecified degree: Secondary | ICD-10-CM

## 2016-04-29 MED ORDER — SILVER SULFADIAZINE 1 % EX CREA: 1.0000 "application " | TOPICAL_CREAM | Freq: Every day | CUTANEOUS | 0 refills | Status: DC

## 2016-04-29 MED FILL — SSD 1% CREAM: 1 | 20 days supply | Qty: 50 | Fill #0

## 2016-04-29 NOTE — Progress Notes (Signed)
E-VISIT for Burn  We are sorry that you are not feeling well. Here is how we plan to help!  Based on what you have shared with me it looks like you may have: 1st degree burn - usually peels like a sunburn in about a week.  The skin should look nearly normal after 2 weeks.    Based on your assessment:  I have prescribed Silvadene 1% cream.  Apply with gloves to affected area 1-2 times a day.  Apply a non-stick dressing such as Telfa to the site after you apply the cream.  You may hold the dressing in place with either paper tape or self adhering wrap such as Coban.  Product similar to Telfa and Coban can be bought at any pharmacy.  If you have a question ask your pharmacist.  Burns are a type of painful wound caused by thermal, electrical, chemical, or electromagnetic energy.  Smoking and open flame are the leading cause of burn injury for older adults.  Scalding from a hot liquid is the leading cause of burn injury for children.  Both infants and older adults are the greatest risk for burn injury.  First degree burns effect only the outer layers of the skin.  The burn may be red and painful but the skin does not blister.  Long term tissue damage is rare.  Second degree burns involve the surface of the skin and the adjacent skin layers.  The burn sire also appears red and painful and the skin often swells and/or blisters.  Third degree burns destroy both layers of the skin and can also penetrate to underlying  Structures.  A third degree burn may not initially hurt because nerve endings were destroyed.  All third degree burns should be evaluated in person.  HOME CARE:   Wash the area gently with soap and water once a day  Apply antibiotic ointment directly to a Band-Aid or dressing and apply Band-Aid or dressing over the burn.  Change dressing every other day.  Use warm water and 1 or 2 wipes with a wet washcloth to remove any surface debris.  Some of the newer antibiotic ointments contain  lidocaine that can help to control the localized pain of the burn.  You should leave intact blisters alone for the first 7 days.  After a week you may gently remove blisters.  The easiest way to do this is gently wipe away the dead skin with wet gauze or wet washcloth.  If that fails you may carefully trim off the dead skin with a pair of fine scissors.  Be sure to clean the scissors in alcohol before use.  GET HELP RIGHT AWAY IF:   The area of the burn is larger than 4 palms of our hand.  You become short of breath.  The site looks infected.  Your symptoms persist after you have completed your treatment plan.  The burn has not healed in 14 days.    MAKE SURE YOU:   Understand these instructions.  Will watch your condition.  Will get help right away if you are not doing well or get worse.  Your e-visit answers were reviewed by a board certified advanced clinical practitioner to complete your personal care plan.  Depending upon the condition, your plan could have included both over the counter or prescription medications.    Please review your pharmacy choice.  Make sure the pharmacy is open so you can pick up prescription now.   If there is   a problem, you may contact your provider through MyChart messaging and have the prescription routed to another pharmacy. Your safety is important to us.  If you have drug allergies check your prescription carefully.    For the next 24 hours you can use MyChart to ask questions about today's visit, request a non-urgent call back, or ask for a work or school excuse.  You will get an email in the next 2 days asking about your experience.  I hope that your e-visit has been valuable and will speed your recovery.  If you need an urgent face to face visit, [facility] has four urgent care centers for your convenience.  . [facility]  [phone] Get Driving Directions Find a Provider at this Location  [address] [place], [place] [address] . 8 am to 8 pm Monday-Friday . 9 am to 7 pm Saturday-Sunday  . [facility]  [phone] Get Driving Directions Find a Provider at this Location  [address] [place], [place] [address] . 8 am to 8 pm Monday-Friday . 9 am to 6 pm Saturday . 11 am to 6 pm Sunday   . [facility] [place]  [phone] Get Driving Directions  [address] [place], [place] [address] . 8 am to 8 pm Monday-Friday . 9 am to 4 pm Saturday-Sunday   . [facility] (a walk in primary care provider)  [phone]  Get Driving Directions Find a Provider at this Location  [address] [place], [place] [address] . 8 am to 8:30 pm Monday-Thursday . 8 am to 6 pm Friday . 8 am to 4 pm Saturday-Sunday     

## 2016-05-05 ENCOUNTER — Telehealth: Payer: 59 | Admitting: Family

## 2016-05-05 DIAGNOSIS — L298 Other pruritus: Secondary | ICD-10-CM

## 2016-05-05 DIAGNOSIS — W57XXXA Bitten or stung by nonvenomous insect and other nonvenomous arthropods, initial encounter: Secondary | ICD-10-CM | POA: Diagnosis not present

## 2016-05-05 MED ORDER — PREDNISONE 10 MG PO TABS: 10.0000 mg | ORAL_TABLET | Freq: Two times a day (BID) | ORAL | 0 refills | Status: DC

## 2016-05-05 MED FILL — predniSONE 10 MG TABS: 10 | 5 days supply | Qty: 10 | Fill #0

## 2016-05-05 NOTE — Progress Notes (Signed)
E Visit for Insect Sting  Thank you for describing the insect sting for us.  Here is how we plan to help!  A sting that we will treat with a short course of prednisone.  The 2 greatest risks from insect stings are allergic reaction, which can be fatal in some people and infection, which is more common and less serious.  Bees, wasps, yellow jackets, and hornets belong to a class of insects called Hymenoptera.  Most insect stings cause only minor discomfort.  Stings can happen anywhere on the body and can be painful.  Most stings are from honey bees or yellow jackets.  Fire ants can sting multiple times.  The sites of the stings are more likely to become infected.    I have sent in prednisone 10 mg tapering dose for 5 days to the pharmacy you selected.  Please make sure that you selected a pharmacy that is open now.  What can be used to prevent Insect Stings?   Insect repellant with at least 20% DEET.    Wearing long pants and shirts with socks and shoes.    Wear dark or drab-colored clothes rather than bright colors.    Avoid using perfumes and hair sprays; these attract insects.  HOME CARE ADVICE:  1. Stinger removal:  The stinger looks like a tiny black dot in the sting.  Use a fingernail, credit card edge, or knife-edge to scrape it off.  Don't pull it out because it squeezes out more venom.  If the stinger is below the skin surface, leave it alone.  It will be shed with normal skin healing. 2. Use cold compresses to the area of the sting for 10-20 minutes.  You may repeat this as needed to relieve symptoms of pain and swelling. 3.  For pain relief, take acetominophen 650 mg 4-6 hours as needed or ibuprofen 400 mg every 6-8 hours as needed or naproxen 250-500 mg every 12 hours as needed. 4.  You can also use hydrocortisone cream 0.5% or 1% up to 4 times daily as needed for itching. 5.  If the sting becomes very itchy, take Benadryl 25-50 mg, follow directions on box. 6.   Wash the area 2-3 times daily with antibacterial soap and warm water. 7. Call your Doctor if:  Fever, a severe headache, or rash occur in the next 2 weeks.  Sting area begins to look infected.  Redness and swelling worsens after home treatment.  Your current symptoms become worse.    MAKE SURE YOU:   Understand these instructions.  Will watch your condition.  Will get help right away if you are not doing well or get worse.  Thank you for choosing an e-visit. Your e-visit answers were reviewed by a board certified advanced clinical practitioner to complete your personal care plan. Depending upon the condition, your plan could have included both over the counter or prescription medications. Please review your pharmacy choice. Be sure that the pharmacy you have chosen is open so that you can pick up your prescription now.  If there is a problem you may message your provider in MyChart to have the prescription routed to another pharmacy. Your safety is important to us. If you have drug allergies check your prescription carefully.  For the next 24 hours, you can use MyChart to ask questions about today's visit, request a non-urgent call back, or ask for a work or school excuse from your e-visit provider. You will get an email in the next   two days asking about your experience. I hope that your e-visit has been valuable and will speed your recovery.    

## 2016-05-18 MED FILL — GABAPENTIN 300 MG CAPSULE: 300 | 90 days supply | Qty: 180 | Fill #2

## 2016-05-19 ENCOUNTER — Ambulatory Visit (INDEPENDENT_AMBULATORY_CARE_PROVIDER_SITE_OTHER): Payer: 59 | Admitting: Family Medicine

## 2016-05-19 ENCOUNTER — Encounter: Payer: Self-pay | Admitting: Family Medicine

## 2016-05-19 VITALS — BP 118/66 | HR 93 | Wt 232.0 lb

## 2016-05-19 DIAGNOSIS — R1013 Epigastric pain: Secondary | ICD-10-CM

## 2016-05-19 MED ORDER — OMEPRAZOLE 40 MG PO CPDR: 40.0000 mg | DELAYED_RELEASE_CAPSULE | Freq: Two times a day (BID) | ORAL | 2 refills | Status: DC

## 2016-05-19 MED ORDER — SUCRALFATE 1 G PO TABS: 1.0000 g | ORAL_TABLET | Freq: Three times a day (TID) | ORAL | 0 refills | Status: DC

## 2016-05-19 NOTE — Progress Notes (Signed)
Subjective:    Patient ID: Kathy Clark, female    DOB: 07-22-1960, 55 y.o.   MRN: [id number]  HPI epigastric pain started saturday night she reports eating some spicy food. some nausesa, burning, burping. She took protonix and levsin she had some diarrhea on Sunday.  She says the diarrhea has resolved but she still having a lot of epigastric discomfort. It's not as severe. She did start taking her omeprazole yesterday morning and says it didn't really help but then after she took it this morning she noticed a little bit more improvement. She says the epigastric pain is worse when she doesn't eat. She says it feels most like her stomach is eating a whole in itself. When she does eat she gets some temporary relief but then it starts hurting again. No vomiting. No blood in the stool. No fevers chills or sweats.   Review of Systems  BP 118/66   Pulse 93   Wt 232 lb (105.2 kg)   SpO2 97%   BMI 35.28 kg/m     Allergies  Allergen Reactions  . Succinylcholine Anaphylaxis  . Dilaudid [Hydromorphone Hcl] Nausea And Vomiting    Past Medical History:  Diagnosis Date  . 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 Essential hypertension, benign   . Lymphocytic colitis    microscopic  . Migraines   . Osteoarthritis     Past Surgical History:  Procedure Laterality Date  . ABDOMINAL HYSTERECTOMY  07-2005  . BACK SURGERY    . KNEE SURGERY  2006   left; multiple  . microdiscetomy  2005  . SHOULDER SURGERY  07-22-09   right  . SKIN CANCER EXCISION     eyelid  . TOTAL KNEE ARTHROPLASTY  07/27/2011   Procedure: TOTAL KNEE ARTHROPLASTY;  Surgeon: Gearlean Alf;  Location: [facility];  Service: Orthopedics;  Laterality: Left;    Social History   Social History  . Marital status: Married    Spouse name: N/A  . Number of children: N/A  . Years of education: N/A   Occupational History  . RN [employer]   Social History Main Topics  . Smoking status: Never Smoker   . Smokeless tobacco: Never Used  . Alcohol use Yes     Comment: occasionally  . Drug use: No  . 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   Other Topics Concern  . Not on file   Social History Narrative  . No narrative on file    Family History  Problem Relation Age of Onset  . Breast cancer    . Tongue cancer Mother   . Breast cancer Mother   . Colon cancer Neg Hx   . Heart disease Father   . Stroke Paternal Grandmother     Outpatient Encounter Prescriptions as of 05/19/2016  Medication Sig  . albuterol (PROVENTIL HFA;VENTOLIN HFA) 108 (90 Base) MCG/ACT inhaler Inhale 2 puffs into the lungs every 6 (six) hours as needed for wheezing or shortness of breath.  . budesonide (ENTOCORT EC) 3 MG 24 hr capsule   . Calcium Carbonate-Vitamin D (CALCIUM 600 + D PO) Take by mouth. 1 TWICE DAILY  . estradiol (VIVELLE-DOT) 0.1 MG/24HR patch Place 1 patch onto the skin 2 (two) times a week.  [name] FLUoxetine (PROZAC) 20 MG tablet Take 1.5 tablets (30 mg total) by mouth daily.  [name] gabapentin (NEURONTIN) 300 MG capsule Take  300 mg by mouth 2 (two) times daily.   . hydrochlorothiazide (HYDRODIURIL) 25 MG tablet Take 1 tablet (25 mg total) by mouth daily.  . hydrocortisone (ANUSOL-HC) 25 MG suppository Insert one suppository into rectum BID for 7 days then q hs for 1 week  . hyoscyamine (LEVSIN/SL) 0.125 MG SL tablet Place 1 tablet (0.125 mg total) under the tongue every 4 (four) hours as needed.  . Melatonin 3 MG TABS Take by mouth as directed.  . SUMAtriptan (IMITREX) 20 MG/ACT nasal spray PLACE 1 SPRAY INTO THE NOSE EVERY 2 HOURS AS NEEDED FOR. HEADACHE  . [DISCONTINUED] Omeprazole 20 MG TBEC Take 1 tablet (20 mg total) by mouth daily before breakfast.  . [DISCONTINUED] predniSONE (DELTASONE) 10 MG tablet Take 1 tablet (10 mg total) by mouth 2 (two) times daily with a meal.  . [DISCONTINUED] silver sulfADIAZINE (SILVADENE) 1 % cream Apply 1  application topically daily.  [name] omeprazole (PRILOSEC) 40 MG capsule Take 1 capsule (40 mg total) by mouth 2 (two) times daily.  . sucralfate (CARAFATE) 1 g tablet Take 1 tablet (1 g total) by mouth 4 (four) times daily -  with meals and at bedtime.   No facility-administered encounter medications on file as of 05/19/2016.          Objective:   Physical Exam  Constitutional: She is oriented to person, place, and time. She appears well-developed and well-nourished.  HENT:  Head: Normocephalic and atraumatic.  Cardiovascular: Normal rate, regular rhythm and normal heart sounds.   Pulmonary/Chest: Effort normal and breath sounds normal.  Abdominal: Soft. Bowel sounds are normal. She exhibits no distension and no mass. There is tenderness. There is no rebound and no guarding.  Tender in the epigastric area.   Neurological: She is alert and oriented to person, place, and time.  Skin: Skin is warm and dry.  Psychiatric: She has a normal mood and affect. Her behavior is normal.       Assessment & Plan:  Epigastric pain-most consistent with gastritis or gastric ulcer. We'll go ahead and increase her omeprazole to 40 mg and have her take it twice a day. We will add Carafate short-term for acute relief. If she is feeling much better than it did encourage her to complete a course of omeprazole for at least 8 weeks. She was also recently on 5 days of prednisone about a week before the symptoms started which certainly could have caused some irritation the lining of the stomach. Call if not significantly better within 10 days. Avoid trigger foods.

## 2016-05-19 NOTE — Patient Instructions (Signed)
Call if not better in 10 days.   

## 2016-05-26 ENCOUNTER — Telehealth: Payer: 59 | Admitting: Family

## 2016-05-26 DIAGNOSIS — B9689 Other specified bacterial agents as the cause of diseases classified elsewhere: Secondary | ICD-10-CM

## 2016-05-26 DIAGNOSIS — J019 Acute sinusitis, unspecified: Principal | ICD-10-CM

## 2016-05-26 MED ORDER — AMOXICILLIN-POT CLAVULANATE 875-125 MG PO TABS: 1.0000 | ORAL_TABLET | Freq: Two times a day (BID) | ORAL | 0 refills | Status: DC

## 2016-05-26 NOTE — Progress Notes (Signed)

## 2016-05-27 NOTE — Progress Notes (Signed)
Error on e-visit. Was suppose to be a demo not a real visit.

## 2016-05-29 ENCOUNTER — Telehealth: Payer: Self-pay | Admitting: *Deleted

## 2016-05-29 NOTE — Telephone Encounter (Signed)
PA submitted for omeprazole 40 twice a day

## 2016-06-01 ENCOUNTER — Encounter: Payer: Self-pay | Admitting: Family Medicine

## 2016-06-02 MED FILL — OMEPRAZOLE DR 40 MG CAPSULE: 40 | 75 days supply | Qty: 150 | Fill #0

## 2016-06-02 NOTE — Telephone Encounter (Signed)
Insurance denied coverage for the omeprazole  40 mg BID for medical necessity.Please advise

## 2016-06-03 ENCOUNTER — Encounter: Payer: Self-pay | Admitting: Family Medicine

## 2016-06-03 NOTE — Telephone Encounter (Signed)
Left message on patients vm

## 2016-06-03 NOTE — Telephone Encounter (Signed)
Please call patient and let her know that it was denied. And see what she would like to do. I don't know she just wants to pay out of pocket or not.

## 2016-06-09 MED FILL — LOSARTAN-HCTZ 50-12.5 MG TA: 50-12.5 | 90 days supply | Qty: 90 | Fill #1

## 2016-06-22 ENCOUNTER — Encounter: Payer: Self-pay | Admitting: Family Medicine

## 2016-06-22 MED FILL — ESTRADIOL 0.075 MG PATCH: 0.075 | 84 days supply | Qty: 24 | Fill #1

## 2016-06-23 ENCOUNTER — Encounter: Payer: Self-pay | Admitting: Family Medicine

## 2016-06-24 ENCOUNTER — Other Ambulatory Visit: Payer: Self-pay | Admitting: Family Medicine

## 2016-06-25 ENCOUNTER — Encounter: Payer: Self-pay | Admitting: Family Medicine

## 2016-06-25 ENCOUNTER — Telehealth: Payer: Self-pay

## 2016-06-25 MED ORDER — SUCRALFATE 1 G PO TABS: 1.0000 g | ORAL_TABLET | Freq: Three times a day (TID) | ORAL | 0 refills | Status: DC

## 2016-06-25 MED FILL — SUCRALFATE 1 GM TABLET: 1 | 30 days supply | Qty: 120 | Fill #0

## 2016-06-25 NOTE — Telephone Encounter (Signed)
Kathy Clark sent a [employer]. I called patient, she is being treated for episodes of stomach burning and nausea with carafate 1 g QID and omeprazole 40 mg bid. She has been referred to GI and has an appointment on 07/08/2016. She is still very uncomfortable. She states the burning is a little better but wants to know if there is something else she can do in the meantime. Denies fever, chills, sweats, vomiting or diarrhea. Please advise.      Note from mychart: Hi,  I called to request an appointment with Dr. Fuller Plan. He cannot see me until January 29! So they put me with his PA Amy on 07/08/16. I am pretty uncomfortable a lot of the time and not sleeping well. Is there anything I should be doing in the meantime or just stay on the current meds?  Thanks and sorry to keep having to bother you!   Kathy Clark

## 2016-06-25 NOTE — Telephone Encounter (Signed)
rx sent to [facility] outpatient pharmacy.Kathy Clark

## 2016-06-26 MED ORDER — RANITIDINE HCL 300 MG PO TABS: 300.0000 mg | ORAL_TABLET | Freq: Two times a day (BID) | ORAL | 3 refills | Status: DC

## 2016-06-26 NOTE — Telephone Encounter (Signed)
Patient advised of recommendations.  

## 2016-06-26 NOTE — Telephone Encounter (Signed)
Why yes there is, adding ranitidine 300 mg twice a day.

## 2016-07-03 ENCOUNTER — Telehealth: Payer: 59 | Admitting: Family

## 2016-07-03 DIAGNOSIS — R21 Rash and other nonspecific skin eruption: Secondary | ICD-10-CM | POA: Diagnosis not present

## 2016-07-03 MED ORDER — MUPIROCIN 2 % EX OINT: 1.0000 "application " | TOPICAL_OINTMENT | Freq: Two times a day (BID) | CUTANEOUS | 0 refills | Status: DC

## 2016-07-03 NOTE — Progress Notes (Signed)
E Visit for Rash  We are sorry that you are not feeling well. Here is how we plan to help!  Based on what you shared with me it looks like you have contact dermatitis.  Contact dermatitis is a skin rash caused by something that touches the skin and causes irritation or inflammation.  Your skin may be red, swollen, dry, cracked, and itch.  The rash should go away in a few days but can last a few weeks.  If you get a rash, it's important to figure out what caused it so the irritant can be avoided in the future.  Based upon what you have shared with me it looks like you have a bacterial follicultits.  Folliculitis is inflammation of the hair follicles that can be caused by a superficial infection of the skin and is treated with an antibiotic. I have prescribed:  Topical mupiricin    HOME CARE:   Take cool showers and avoid direct sunlight.  Apply cool compress or wet dressings.  Take a bath in an oatmeal bath.  Sprinkle content of one Aveeno packet under running faucet with comfortably warm water.  Bathe for 15-20 minutes, 1-2 times daily.  Pat dry with a towel. Do not rub the rash.  Use hydrocortisone cream.  Take an antihistamine like Benadryl for widespread rashes that itch.  The adult dose of Benadryl is 25-50 mg by mouth 4 times daily.  Caution:  This type of medication may cause sleepiness.  Do not drink alcohol, drive, or operate dangerous machinery while taking antihistamines.  Do not take these medications if you have prostate enlargement.  Read package instructions thoroughly on all medications that you take.  GET HELP RIGHT AWAY IF:   Symptoms don't go away after treatment.  Severe itching that persists.  If you rash spreads or swells.  If you rash begins to smell.  If it blisters and opens or develops a yellow-brown crust.  You develop a fever.  You have a sore throat.  You become short of breath.  MAKE SURE YOU:  Understand these instructions. Will watch  your condition. Will get help right away if you are not doing well or get worse.  Thank you for choosing an e-visit. Your e-visit answers were reviewed by a board certified advanced clinical practitioner to complete your personal care plan. Depending upon the condition, your plan could have included both over the counter or prescription medications. Please review your pharmacy choice. Be sure that the pharmacy you have chosen is open so that you can pick up your prescription now.  If there is a problem you may message your provider in [place] to have the prescription routed to another pharmacy. Your safety is important to [place]. If you have drug allergies check your prescription carefully.  For the next 24 hours, you can use MyChart to ask questions about today's visit, request a non-urgent call back, or ask for a work or school excuse from your e-visit provider. You will get an email in the next two days asking about your experience. I hope that your e-visit has been valuable and will speed your recovery.

## 2016-07-08 ENCOUNTER — Encounter: Payer: Self-pay | Admitting: Physician Assistant

## 2016-07-08 ENCOUNTER — Encounter (INDEPENDENT_AMBULATORY_CARE_PROVIDER_SITE_OTHER): Payer: 59 | Admitting: Family Medicine

## 2016-07-08 ENCOUNTER — Ambulatory Visit (INDEPENDENT_AMBULATORY_CARE_PROVIDER_SITE_OTHER): Payer: 59 | Admitting: Physician Assistant

## 2016-07-08 VITALS — BP 118/82 | HR 78 | Ht 68.0 in | Wt 241.2 lb

## 2016-07-08 DIAGNOSIS — R110 Nausea: Secondary | ICD-10-CM | POA: Diagnosis not present

## 2016-07-08 DIAGNOSIS — R1013 Epigastric pain: Secondary | ICD-10-CM | POA: Diagnosis not present

## 2016-07-08 MED ORDER — RANITIDINE HCL 300 MG PO TABS: 300.0000 mg | ORAL_TABLET | Freq: Two times a day (BID) | ORAL | 2 refills | Status: DC

## 2016-07-08 NOTE — Progress Notes (Signed)
Subjective:    Patient ID: Kathy Clark, female    DOB: April 30, 1961, 55 y.o.   MRN: [id number]  HPI Kathy Clark is a pleasant 55 year old white female known to Dr. Fuller Plan who is referred today by Dr. Charise Carwin for evaluation of epigastric pain. Patient says that she had taken a course of steroids a couple of months ago I believe for rash and says her stomach started bothering her after that. She describes ongoing problems with epigastric pain and burning and a gnawing sensation associated with vague nausea ,but no vomiting. Not having any heartburn or indigestion no dysphagia. Appetite has been somewhat decreased she thinks primarily because she is afraid to eat too much. No changes in bowel habits no melena or hematochezia. She was seen by her PCP and started on a one-month course of omeprazole at 40 mg twice daily. She says her symptoms are perhaps 30-40% better and when she went back she also had Zantac 300 twice a day added to her regimen and a course of Carafate 1 g 4 times daily.  On that  regimen over the past 3 weeks or so she has had some minimal continued improvement but says his symptoms seem to come back if she gets closer to her next dose of medication or skips a dose. She been on any aspirin or NSAIDs. No previous history of H. pylori or testing. No new medications supplements etc. Patient does have history of lymphocytic colitis which had been treated with budesonide in the past. Last colonoscopy done in April 2017 per Dr. Fuller Plan with an 8 mm and 4 mm polyp removed both were hyperplastic there was no evidence of active colitis and biopsies were not repeated. She has not had prior EGD.  Review of Systems Pertinent positive and negative review of systems were noted in the above HPI section.  All other review of systems was otherwise negative.  Outpatient Encounter Prescriptions as of 07/08/2016  Medication Sig  . albuterol (PROVENTIL HFA;VENTOLIN HFA) 108 (90 Base) MCG/ACT inhaler Inhale 2 puffs  into the lungs every 6 (six) hours as needed for wheezing or shortness of breath.  . estradiol (VIVELLE-DOT) 0.1 MG/24HR patch Place 1 patch onto the skin 2 (two) times a week.  [name] FLUoxetine (PROZAC) 20 MG tablet Take 1.5 tablets (30 mg total) by mouth daily.  [name] gabapentin (NEURONTIN) 300 MG capsule Take 300 mg by mouth 2 (two) times daily.   [name] losartan-hydrochlorothiazide (HYZAAR) 50-12.5 MG tablet daily at 2 PM.  . Melatonin 3 MG TABS Take by mouth as directed.  . mupirocin ointment (BACTROBAN) 2 % Place 1 application into the nose 2 (two) times daily.  [name] omeprazole (PRILOSEC) 40 MG capsule Take 1 capsule (40 mg total) by mouth 2 (two) times daily.  . ranitidine (ZANTAC) 300 MG tablet Take 1 tablet (300 mg total) by mouth 2 (two) times daily.  . sucralfate (CARAFATE) 1 g tablet Take 1 tablet (1 g total) by mouth 4 (four) times daily -  with meals and at bedtime.  . SUMAtriptan (IMITREX) 20 MG/ACT nasal spray PLACE 1 SPRAY INTO THE NOSE EVERY 2 HOURS AS NEEDED FOR. HEADACHE  . [DISCONTINUED] ranitidine (ZANTAC) 300 MG tablet Take 1 tablet (300 mg total) by mouth 2 (two) times daily.  . [DISCONTINUED] amoxicillin-clavulanate (AUGMENTIN) 875-125 MG tablet Take 1 tablet by mouth 2 (two) times daily.  . [DISCONTINUED] budesonide (ENTOCORT EC) 3 MG 24 hr capsule   . [DISCONTINUED] Calcium Carbonate-Vitamin D (CALCIUM 600 + D PO)  Take by mouth. 1 TWICE DAILY  . [DISCONTINUED] hydrochlorothiazide (HYDRODIURIL) 25 MG tablet Take 1 tablet (25 mg total) by mouth daily.  . [DISCONTINUED] hydrocortisone (ANUSOL-HC) 25 MG suppository Insert one suppository into rectum BID for 7 days then q hs for 1 week  . [DISCONTINUED] hyoscyamine (LEVSIN/SL) 0.125 MG SL tablet Place 1 tablet (0.125 mg total) under the tongue every 4 (four) hours as needed.   No facility-administered encounter medications on file as of 07/08/2016.    Allergies  Allergen Reactions  . Succinylcholine Anaphylaxis  . Dilaudid  [Hydromorphone Hcl] Nausea And Vomiting   Patient Active Problem List   Diagnosis Date Noted  . Left tennis elbow 04/06/2016  . 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 Depression 08/23/2015  . Subacromial bursitis 04/23/2015  . Bilateral carpal tunnel syndrome 12/24/2014  . Trochanteric bursitis of right hip 04/21/2013  . Postop Mild Hyponatremia 07/30/2011  . OSTEOARTHRITIS, KNEE, LEFT 12/12/2009  . MIGRAINE WITH AURA 05/31/2008  . SKIN CANCER, HX OF 01/06/2008  . COLITIS, HX OF 01/06/2008  . ALLERGIC RHINITIS 05/20/2007  . HYPERTENSION, BENIGN ESSENTIAL 11/23/2006  . SCIATICA, CHRONIC 05/19/2006   Social History   Social History  . Marital status: Married    Spouse name: N/A  . Number of children: 2  . Years of education: N/A   Occupational History  . RN [employer]   Social History Main Topics  . Smoking status: Never Smoker  . Smokeless tobacco: Never Used  . Alcohol use Yes     Comment: occasionally  . Drug use: No  . 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   Other Topics Concern  . Not on file   Social History Narrative  . No narrative on file    Kathy Clark's family history includes Breast cancer in her mother; Heart disease in her father; Stroke in her paternal grandmother; Tongue cancer in her mother.      Objective:    Vitals:   07/08/16 0931  BP: 118/82  Pulse: 78    Physical Exam  well-developed white female in no acute distress, quite pleasant blood pressure 118/82 pulse 78, height 5 foot 8 weight 241 BMI of 36.6. HEENT; nontraumatic normocephalic EOMI PERRLA sclera anicteric, Cardiovascular ;regular rate and rhythm with S1-S2 no murmur or gallop, Pulmonary; clear bilaterally, Abdomen; soft nontender tenderness in the epigastrium there is no guarding or rebound no palpable mass or hepatosplenomegaly bowel sounds are present, Rectal; exam not done, Extremities; no clubbing cyanosis or edema skin  warm and dry, Neuropsych; mood and affect appropriate       Assessment & Plan:   #60 55 year old white female with 6 week history of persistent epigastric pain and burning and gnawing somewhat improved with high-dose PPI, high dose H2 blocker and Carafate though not resolved. Symptoms are most consistent with gastritis or peptic ulcer disease, rule out H. pylori induced gastropathy. #2 history of lymphocytic colitis-asymptomatic #3 hypertension Number for anxiety/depression #5 obesity  Plan; stopped Carafate, patient has had a four-week course Continue omeprazole 40 mg by mouth twice a day Continue Zantac 300 milligrams by mouth twice a day short-term Will schedule for EGD to include biopsies to rule out H. pylori with Dr. Fuller Plan. Procedure discussed in detail with patient including risks and benefits and she is agreeable to proceed.  Amy Genia Harold PA-C 07/08/2016   Cc: Hali Marry, *

## 2016-07-08 NOTE — Patient Instructions (Signed)
You have been scheduled for an endoscopy. Please follow written instructions given to you at your visit today. If you use inhalers (even only as needed), please bring them with you on the day of your procedure. Your physician has requested that you go to www.startemmi.com and enter the access code given to you at your visit today. This web site gives a general overview about your procedure. However, you should still follow specific instructions given to you by our office regarding your preparation for the procedure.  Stop Carafate.  Continue Zantac 300 mg twice a day.   Continue Omeprazole 40 mg twice a day.

## 2016-07-08 NOTE — Progress Notes (Signed)
Reviewed and agree with management plan.  Velinda Wrobel T. Domenique Quest, MD FACG 

## 2016-07-09 ENCOUNTER — Telehealth: Payer: 59 | Admitting: Physician Assistant

## 2016-07-09 DIAGNOSIS — B9689 Other specified bacterial agents as the cause of diseases classified elsewhere: Secondary | ICD-10-CM | POA: Diagnosis not present

## 2016-07-09 DIAGNOSIS — J9801 Acute bronchospasm: Secondary | ICD-10-CM | POA: Diagnosis not present

## 2016-07-09 DIAGNOSIS — J019 Acute sinusitis, unspecified: Secondary | ICD-10-CM | POA: Diagnosis not present

## 2016-07-09 MED ORDER — ALBUTEROL SULFATE HFA 108 (90 BASE) MCG/ACT IN AERS: 2.0000 | INHALATION_SPRAY | Freq: Four times a day (QID) | RESPIRATORY_TRACT | 0 refills | Status: DC | PRN

## 2016-07-09 MED ORDER — BENZONATATE 100 MG PO CAPS: 100.0000 mg | ORAL_CAPSULE | Freq: Three times a day (TID) | ORAL | 0 refills | Status: DC | PRN

## 2016-07-09 MED ORDER — DOXYCYCLINE HYCLATE 100 MG PO CAPS: 100.0000 mg | ORAL_CAPSULE | Freq: Two times a day (BID) | ORAL | 0 refills | Status: DC

## 2016-07-09 MED FILL — DOXYCYCLINE HYCLATE 100 MG: 100 | 10 days supply | Qty: 20 | Fill #0

## 2016-07-09 MED FILL — BENZONATATE 100 MG CAPSULE: 100 | 10 days supply | Qty: 30 | Fill #0

## 2016-07-09 MED FILL — VENTOLIN HFA 90 MCG INHALER: 108 (90 BAS | 25 days supply | Qty: 18 | Fill #0

## 2016-07-09 NOTE — Progress Notes (Signed)
We are sorry that you are not feeling well.  Here is how we plan to help!  Based on what you have shared with me it looks like you have sinusitis.  Sinusitis is inflammation and infection in the sinus cavities of the head.  Based on your presentation I believe you most likely have Acute Bacterial Sinusitis.  This is an infection caused by bacteria and is treated with antibiotics. I have prescribed Doxycycline 100mg  by mouth twice a day for 10 days. You may use an oral decongestant such as Mucinex D or if you have glaucoma or high blood pressure use plain Mucinex. Saline nasal spray help and can safely be used as often as needed for congestion. I am sending in a refill of your albuterol to use as directed.    If you develop worsening sinus pain, fever or notice severe headache and vision changes, or if symptoms are not better after completion of antibiotic, please schedule an appointment with a health care provider.    Sinus infections are not as easily transmitted as other respiratory infection, however we still recommend that you avoid close contact with loved ones, especially the very young and elderly.  Remember to wash your hands thoroughly throughout the day as this is the number one way to prevent the spread of infection!  Home Care:  Only take medications as instructed by your medical team.  Complete the entire course of an antibiotic.  Do not take these medications with alcohol.  A steam or ultrasonic humidifier can help congestion.  You can place a towel over your head and breathe in the steam from hot water coming from a faucet.  Avoid close contacts especially the very young and the elderly.  Cover your mouth when you cough or sneeze.  Always remember to wash your hands.  Get Help Right Away If:  You develop worsening fever or sinus pain.  You develop a severe head ache or visual changes.  Your symptoms persist after you have completed your treatment plan.  Make sure  you  Understand these instructions.  Will watch your condition.  Will get help right away if you are not doing well or get worse.  Your e-visit answers were reviewed by a board certified advanced clinical practitioner to complete your personal care plan.  Depending on the condition, your plan could have included both over the counter or prescription medications.  If there is a problem please reply  once you have received a response from your provider.  Your safety is important to [place].  If you have drug allergies check your prescription carefully.    You can use MyChart to ask questions about today's visit, request a non-urgent call back, or ask for a work or school excuse for 24 hours related to this e-Visit. If it has been greater than 24 hours you will need to follow up with your provider, or enter a new e-Visit to address those concerns.  You will get an e-mail in the next two days asking about your experience.  I hope that your e-visit has been valuable and will speed your recovery. Thank you for using e-visits.

## 2016-07-09 NOTE — Addendum Note (Signed)
Addended by: Raiford Noble on: 07/09/2016 09:32 AM   Modules accepted: Orders

## 2016-07-10 ENCOUNTER — Ambulatory Visit (AMBULATORY_SURGERY_CENTER): Payer: 59 | Admitting: Gastroenterology

## 2016-07-10 ENCOUNTER — Encounter: Payer: Self-pay | Admitting: Gastroenterology

## 2016-07-10 VITALS — BP 131/85 | HR 75 | Temp 96.6°F | Resp 13 | Ht 68.0 in | Wt 241.0 lb

## 2016-07-10 DIAGNOSIS — I10 Essential (primary) hypertension: Secondary | ICD-10-CM | POA: Diagnosis not present

## 2016-07-10 DIAGNOSIS — R1013 Epigastric pain: Secondary | ICD-10-CM

## 2016-07-10 DIAGNOSIS — K2950 Unspecified chronic gastritis without bleeding: Secondary | ICD-10-CM | POA: Diagnosis not present

## 2016-07-10 DIAGNOSIS — K30 Functional dyspepsia: Secondary | ICD-10-CM | POA: Diagnosis not present

## 2016-07-10 MED ORDER — SODIUM CHLORIDE 0.9 % IV SOLN: 500.0000 mL | INTRAVENOUS | Status: DC

## 2016-07-10 NOTE — Progress Notes (Signed)
Called to room to assist during endoscopic procedure.  Patient ID and intended procedure confirmed with present staff. Received instructions for my participation in the procedure from the performing physician.  

## 2016-07-10 NOTE — Progress Notes (Signed)
Report to PACU, RN, vss, BBS= Clear.  

## 2016-07-10 NOTE — Op Note (Signed)
[facility] Patient Name: Kathy Clark Procedure Date: 07/10/2016 9:45 AM MRN: [id number] Endoscopist: Ladene Artist , MD Age: 55 Referring MD:  Date of Birth: 02-Jan-1961 Gender: Female Account #: [id number] Procedure:                Upper GI endoscopy Indications:              Epigastric abdominal pain Medicines:                Monitored Anesthesia Care Procedure:                Pre-Anesthesia Assessment:                           - Prior to the procedure, a History and Physical                            was performed, and patient medications and                            allergies were reviewed. The patient's tolerance of                            previous anesthesia was also reviewed. The risks                            and benefits of the procedure and the sedation                            options and risks were discussed with the patient.                            All questions were answered, and informed consent                            was obtained. Prior Anticoagulants: The patient has                            taken no previous anticoagulant or antiplatelet                            agents. ASA Grade Assessment: II - A patient with                            mild systemic disease. After reviewing the risks                            and benefits, the patient was deemed in                            satisfactory condition to undergo the procedure.                           After obtaining informed consent, the endoscope was  passed under direct vision. Throughout the                            procedure, the patient's blood pressure, pulse, and                            oxygen saturations were monitored continuously. The                            Model GIF-HQ190 [id number]) scope was introduced                            through the mouth, and advanced to the second part                            of duodenum. The upper GI  endoscopy was                            accomplished without difficulty. The patient                            tolerated the procedure well. Scope In: Scope Out: Findings:                 The examined esophagus was normal.                           Diffuse mild inflammation characterized by erythema                            and granularity was found in the entire examined                            stomach. Biopsies were taken with a cold forceps                            for histology.                           The exam of the stomach was otherwise normal.                           The duodenal bulb and second portion of the                            duodenum were normal. Complications:            No immediate complications. Estimated Blood Loss:     Estimated blood loss was minimal. Impression:               - Normal esophagus.                           - Gastritis. Biopsied.                           - Normal duodenal bulb and  second portion of the                            duodenum. Recommendation:           - Patient has a contact number available for                            emergencies. The signs and symptoms of potential                            delayed complications were discussed with the                            patient. Return to normal activities tomorrow.                            Written discharge instructions were provided to the                            patient.                           - Resume previous diet.                           - Continue present medications.                           - Await pathology results. Ladene Artist, MD 07/10/2016 10:05:46 AM This report has been signed electronically.

## 2016-07-10 NOTE — Patient Instructions (Signed)
YOU HAD AN ENDOSCOPIC PROCEDURE TODAY AT THE [facility]:   Refer to the procedure report that was given to you for any specific questions about what was found during the examination.  If the procedure report does not answer your questions, please call your gastroenterologist to clarify.  If you requested that your care partner not be given the details of your procedure findings, then the procedure report has been included in a sealed envelope for you to review at your convenience later.  YOU SHOULD EXPECT: Some feelings of bloating in the abdomen. Passage of more gas than usual.  Walking can help get rid of the air that was put into your GI tract during the procedure and reduce the bloating. If you had a lower endoscopy (such as a colonoscopy or flexible sigmoidoscopy) you may notice spotting of blood in your stool or on the toilet paper. If you underwent a bowel prep for your procedure, you may not have a normal bowel movement for a few days.  Please Note:  You might notice some irritation and congestion in your nose or some drainage.  This is from the oxygen used during your procedure.  There is no need for concern and it should clear up in a day or so.  SYMPTOMS TO REPORT IMMEDIATELY:    Following upper endoscopy (EGD)  Vomiting of blood or coffee ground material  New chest pain or pain under the shoulder blades  Painful or persistently difficult swallowing  New shortness of breath  Fever of 100F or higher  Black, tarry-looking stools  For urgent or emergent issues, a gastroenterologist can be reached at any hour by calling [phone].    DIET:  We do recommend a small meal at first, but then you may proceed to your regular diet.  Drink plenty of fluids but you should avoid alcoholic beverages for 24 hours.  ACTIVITY:  You should plan to take it easy for the rest of today and you should NOT DRIVE or use heavy machinery until tomorrow (because of the sedation medicines  used during the test).    FOLLOW UP: Our staff will call the number listed on your records the next business day following your procedure to check on you and address any questions or concerns that you may have regarding the information given to you following your procedure. If we do not reach you, we will leave a message.  However, if you are feeling well and you are not experiencing any problems, there is no need to return our call.  We will assume that you have returned to your regular daily activities without incident.  If any biopsies were taken you will be contacted by phone or by letter within the next 1-3 weeks.  Please call us at [phone] if you have not heard about the biopsies in 3 weeks.    SIGNATURES/CONFIDENTIALITY: You and/or your care partner have signed paperwork which will be entered into your electronic medical record.  These signatures attest to the fact that that the information above on your After Visit Summary has been reviewed and is understood.  Full responsibility of the confidentiality of this discharge information lies with you and/or your care-partner.  Thank you for letting us take care of your healthcare needs today. 

## 2016-07-14 ENCOUNTER — Telehealth: Payer: Self-pay

## 2016-07-14 NOTE — Telephone Encounter (Signed)
  Follow up Call-  Call back number 07/10/2016 11/06/2015  Post procedure Call Back phone  # [phone]  Permission to leave phone message Yes Yes  Some recent data might be hidden     Patient questions:  Do you have a fever, pain , or abdominal swelling? No. Pain Score  0 *  Have you tolerated food without any problems? Yes.    Have you been able to return to your normal activities? Yes.    Do you have any questions about your discharge instructions: Diet   No. Medications  No. Follow up visit  No.  Do you have questions or concerns about your Care? No.  Actions: * If pain score is 4 or above: No action needed, pain <4.

## 2016-07-14 NOTE — Telephone Encounter (Signed)
  Follow up Call-  Call back number 07/10/2016 11/06/2015  Post procedure Call Back phone  # [phone]  Permission to leave phone message Yes Yes  Some recent data might be hidden    Patient was called for follow up after her procedure on 07/10/2016. No answer at the number given for follow up phone call. A message was left on the answering machine.

## 2016-07-20 ENCOUNTER — Encounter: Payer: Self-pay | Admitting: Gastroenterology

## 2016-07-21 ENCOUNTER — Encounter: Payer: Self-pay | Admitting: Physician Assistant

## 2016-07-21 ENCOUNTER — Telehealth: Payer: Self-pay | Admitting: Nurse Practitioner

## 2016-07-21 ENCOUNTER — Encounter: Payer: Self-pay | Admitting: Nurse Practitioner

## 2016-07-21 NOTE — Progress Notes (Signed)
ERRONEOUS ENCOUNTER

## 2016-07-27 MED FILL — raNITIdine HCL 300 MG TABS: 300 | 90 days supply | Qty: 180 | Fill #0

## 2016-07-29 ENCOUNTER — Ambulatory Visit (INDEPENDENT_AMBULATORY_CARE_PROVIDER_SITE_OTHER): Payer: 59 | Admitting: Family Medicine

## 2016-08-03 ENCOUNTER — Encounter (INDEPENDENT_AMBULATORY_CARE_PROVIDER_SITE_OTHER): Payer: Self-pay | Admitting: Family Medicine

## 2016-08-03 ENCOUNTER — Ambulatory Visit (INDEPENDENT_AMBULATORY_CARE_PROVIDER_SITE_OTHER): Payer: 59 | Admitting: Family Medicine

## 2016-08-03 VITALS — BP 119/79 | HR 73 | Temp 97.9°F | Resp 12 | Ht 68.0 in | Wt 237.0 lb

## 2016-08-03 DIAGNOSIS — I10 Essential (primary) hypertension: Secondary | ICD-10-CM

## 2016-08-03 DIAGNOSIS — Z1389 Encounter for screening for other disorder: Secondary | ICD-10-CM

## 2016-08-03 DIAGNOSIS — E559 Vitamin D deficiency, unspecified: Secondary | ICD-10-CM | POA: Insufficient documentation

## 2016-08-03 DIAGNOSIS — R0600 Dyspnea, unspecified: Secondary | ICD-10-CM

## 2016-08-03 DIAGNOSIS — E669 Obesity, unspecified: Secondary | ICD-10-CM

## 2016-08-03 DIAGNOSIS — Z6836 Body mass index (BMI) 36.0-36.9, adult: Secondary | ICD-10-CM

## 2016-08-03 DIAGNOSIS — Z1331 Encounter for screening for depression: Secondary | ICD-10-CM

## 2016-08-03 DIAGNOSIS — R5383 Other fatigue: Secondary | ICD-10-CM

## 2016-08-03 DIAGNOSIS — R0609 Other forms of dyspnea: Secondary | ICD-10-CM

## 2016-08-03 DIAGNOSIS — Z9189 Other specified personal risk factors, not elsewhere classified: Secondary | ICD-10-CM

## 2016-08-03 DIAGNOSIS — E66812 Obesity, class 2: Secondary | ICD-10-CM | POA: Insufficient documentation

## 2016-08-03 DIAGNOSIS — Z0289 Encounter for other administrative examinations: Secondary | ICD-10-CM

## 2016-08-03 DIAGNOSIS — R0602 Shortness of breath: Secondary | ICD-10-CM | POA: Diagnosis not present

## 2016-08-03 NOTE — Progress Notes (Signed)
Office: [phone]  /  Fax: [phone]   HPI:   Chief Complaint: Kathy Clark (MR# [id number]) is a 56 y.o. female who presents on 08/03/2016 for obesity evaluation and treatment. Current BMI is Body mass index is 36.04 kg/m.Kathy Clark has struggled with obesity for years and has been unsuccessful in either losing weight or maintaining long term weight loss. Kathy Clark attended our information session and states she is currently in the action stage of change and ready to dedicate time achieving and maintaining a healthier weight.  Kathy Clark states her family eats meals together she thinks her family will eat healthier with  her she struggles with family and or coworkers weight loss sabotage her desired weight is 165-175 she has been heavy most of  her life she started gaining weight in grade school her heaviest weight ever was 245 lbs. she has significant food cravings issues  she is frequently drinking liquids with calories she frequently makes poor food choices she has problems with excessive hunger  she frequently eats larger portions than normal  she has binge eating behaviors she struggles with emotional eating    Fatigue Kathy Clark feels her energy is lower than it should be. This has worsened with weight gain and has not worsened recently. Kathy Clark admits to daytime somnolence and  admits to waking up still tired. Patient is at risk for obstructive sleep apnea. Patent has a history of symptoms of daytime fatigue and hypertension. Patient generally gets 5 or 6 hours of sleep per night, and states they generally have restless sleep. Snoring is present. Apneic episodes are not present. Epworth Sleepiness Score is 9.  Dyspnea on exertion Kathy Clark notes increasing shortness of breath with exercising and seems to be worsening over time with weight gain. She notes getting out of breath sooner with activity than she used to. This has not gotten worse recently. Kathy Clark denies  orthopnea.  Vitamin D deficiency Kathy Clark has a diagnosis of vitamin D deficiency. She is currently taking vit D, last level was not at goal, she admits to fatigue, nausea and denies vomiting or muscle weakness.  Hypertension Kathy Clark is a 56 y.o. female with hypertension.  Blood pressure is currently controlled on Losartan-HCTZ, no chest pain or headache today. She is working weight loss to help control her blood pressure with the goal of decreasing her BP medications and decreasing her risk of heart attack and stroke.   At risk for cardiovascular disease Kathy Clark is at a higher than average risk for cardiovascular disease due to obesity and HTN.. She currently denies any chest pain.   Depression Screen Kathy Clark's Food and Mood (modified PHQ-9) score was  Depression screen PHQ 2/9 08/03/2016  Decreased Interest 3  Down, Depressed, Hopeless 3  PHQ - 2 Score 6  Altered sleeping 3  Tired, decreased energy 3  Change in appetite 3  Feeling bad or failure about yourself  3  Trouble concentrating 2  Moving slowly or fidgety/restless 0  Suicidal thoughts 0  PHQ-9 Score 20  Difficult doing work/chores -    ALLERGIES: Allergies  Allergen Reactions  . Succinylcholine Anaphylaxis  . Dilaudid [Hydromorphone Hcl] Nausea And Vomiting    MEDICATIONS: Current Outpatient Prescriptions on File Prior to Visit  Medication Sig Dispense Refill  . estradiol (VIVELLE-DOT) 0.1 MG/24HR patch Place 1 patch onto the skin 2 (two) times a week.    [name] FLUoxetine (PROZAC) 20 MG tablet Take 1.5 tablets (30 mg total) by mouth daily. 135 tablet 1  .  gabapentin (NEURONTIN) 300 MG capsule Take 300 mg by mouth 2 (two) times daily.     [name] losartan-hydrochlorothiazide (HYZAAR) 50-12.5 MG tablet daily at 2 PM.  3  . Melatonin 3 MG TABS Take by mouth as directed.    . Multiple Vitamins-Minerals (MULTIVITAMIN ADULT PO) Take by mouth daily.    . mupirocin ointment (BACTROBAN) 2 % Place 1 application into the nose 2  (two) times daily. 22 g 0  . omeprazole (PRILOSEC) 40 MG capsule Take 1 capsule (40 mg total) by mouth 2 (two) times daily. 60 capsule 2  . ranitidine (ZANTAC) 300 MG tablet Take 1 tablet (300 mg total) by mouth 2 (two) times daily. 180 tablet 2  . SUMAtriptan (IMITREX) 20 MG/ACT nasal spray PLACE 1 SPRAY INTO THE NOSE EVERY 2 HOURS AS NEEDED FOR. HEADACHE 6 Inhaler 2  . [DISCONTINUED] rivaroxaban (XARELTO) 10 MG TABS tablet Take 1 tablet (10 mg total) by mouth daily with breakfast. 18 tablet 0   Current Facility-Administered Medications on File Prior to Visit  Medication Dose Route Frequency Provider Last Rate Last Dose  . 0.9 %  sodium chloride infusion  500 mL Intravenous Continuous Ladene Artist, MD        PAST MEDICAL HISTORY: Past Medical History:  Diagnosis Date  . Anxiety   . Back pain   . 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 Depression   . Essential hypertension, benign   . Gastritis   . GERD (gastroesophageal reflux disease)   . Hypertension   . Joint pain   . Lymphocytic colitis    microscopic  . Migraines   . Osteoarthritis   . Sciatica    right leg  . Swelling    feet, left foot    PAST SURGICAL HISTORY: Past Surgical History:  Procedure Laterality Date  . ABDOMINAL HYSTERECTOMY  07-2005  . BACK SURGERY    . KNEE SURGERY  2006   left; multiple  . microdiscetomy  2005  . SHOULDER SURGERY  07-22-09   right  . SKIN CANCER EXCISION     eyelid  . TOTAL KNEE ARTHROPLASTY  07/27/2011   Procedure: TOTAL KNEE ARTHROPLASTY;  Surgeon: Gearlean Alf;  Location: [facility];  Service: Orthopedics;  Laterality: Left;    SOCIAL HISTORY: Social History  Substance Use Topics  . Smoking status: Never Smoker  . Smokeless tobacco: Never Used  . Alcohol use Yes     Comment: occasionally    FAMILY HISTORY: Family History  Problem Relation Age of Onset  . Breast cancer    . Tongue cancer Mother   . Breast cancer Mother   . Anxiety disorder  Mother   . Depression Mother   . Heart disease Father   . Hypertension Father   . Hyperlipidemia Father   . Stroke Paternal Grandmother   . Colon cancer Neg Hx     ROS: Review of Systems  Constitutional: Positive for malaise/fatigue.  HENT: Positive for congestion.        Nasal discharge  Eyes:       Wear Glasses or Contacts Floaters  Cardiovascular: Negative for chest pain.  Gastrointestinal: Positive for diarrhea (intermittent), heartburn (epigastric pain) and nausea.  Musculoskeletal: Positive for back pain, joint pain and myalgias.       Negative muscle weakness  Skin: Positive for rash.       dryness  Neurological: Positive for headaches.  Endo/Heme/Allergies: Bruises/bleeds easily.  Psychiatric/Behavioral: Positive for depression. The patient  is nervous/anxious and has insomnia.     PHYSICAL EXAM: Blood pressure 119/79, pulse 73, temperature 97.9 F (36.6 C), temperature source Oral, resp. rate 12, height 5\' 8"  (1.727 m), weight 237 lb (107.5 kg), SpO2 98 %. Body mass index is 36.04 kg/m. Physical Exam  Vitals reviewed.   RECENT LABS AND TESTS: BMET    Component Value Date/Time   NA 138 03/26/2016 1600   K 4.0 03/26/2016 1600   CL 100 03/26/2016 1600   CO2 29 03/26/2016 1600   GLUCOSE 79 03/26/2016 1600   BUN 15 03/26/2016 1600   CREATININE 0.61 03/26/2016 1600   CALCIUM 9.4 03/26/2016 1600   GFRNONAA >89 03/26/2016 1600   GFRAA >89 03/26/2016 1600   No results found for: HGBA1C No results found for: INSULIN CBC    Component Value Date/Time   WBC 6.7 03/26/2016 1600   RBC 4.68 03/26/2016 1600   HGB 14.3 03/26/2016 1600   HCT 42.6 03/26/2016 1600   PLT 287 03/26/2016 1600   MCV 91.0 03/26/2016 1600   MCH 30.6 03/26/2016 1600   MCHC 33.6 03/26/2016 1600   RDW 13.0 03/26/2016 1600   LYMPHSABS 1,608 03/26/2016 1600   MONOABS 469 03/26/2016 1600   EOSABS 201 03/26/2016 1600   BASOSABS 0 03/26/2016 1600   Iron/TIBC/Ferritin/ %Sat    Component  Value Date/Time   FERRITIN 142 12/25/2015 0912   Lipid Panel     Component Value Date/Time   CHOL 193 04/05/2015 0937   TRIG 105 04/05/2015 0937   HDL 65 04/05/2015 0937   CHOLHDL 3.0 04/05/2015 0937   VLDL 21 04/05/2015 0937   LDLCALC 107 04/05/2015 0937   Hepatic Function Panel     Component Value Date/Time   PROT 6.6 12/25/2015 0912   ALBUMIN 4.1 12/25/2015 0912   AST 14 12/25/2015 0912   ALT 21 12/25/2015 0912   ALKPHOS 37 12/25/2015 0912   BILITOT 0.6 12/25/2015 0912   BILIDIR 0.1 12/25/2015 0912   IBILI 0.5 12/25/2015 0912      Component Value Date/Time   TSH 2.09 03/26/2016 1600   TSH 3.10 12/25/2015 0912   TSH 1.70 05/15/2010    ECG  shows NSR with a rate of 63 BPM INDIRECT CALORIMETER done today shows a VO2 of 239 and a REE of 1665.    ASSESSMENT AND PLAN: Other fatigue - Plan: EKG 12-Lead, Comprehensive metabolic panel, CBC With Differential, Vitamin B12, Lipid Panel With LDL/HDL Ratio, T3, T4, free, TSH, Insulin, random, Hemoglobin A1c, Folate, EKG 12-Lead  Shortness of breath  Vitamin D deficiency - Plan: VITAMIN D 25 Hydroxy (Vit-D Deficiency, Fractures)  Essential hypertension  Depression screening  At risk for heart disease  Class 2 obesity without serious comorbidity with body mass index (BMI) of 36.0 to 36.9 in adult, unspecified obesity type  PLAN:  Fatigue Kathy Clark was informed that her fatigue may be related to obesity, depression or many other causes. Labs will be ordered, and in the meanwhile Kathy Clark has agreed to work on diet, exercise and weight loss to help with fatigue. Proper sleep hygiene was discussed including the need for 7-8 hours of quality sleep each night. A sleep study was not ordered based on symptoms and Epworth score.  Dyspnea on exertion Kathy Clark's shortness of breath appears to be obesity related and exercise induced. She has agreed to work on weight loss and gradually increase exercise to treat her exercise induced shortness  of breath. If Kathy Clark follows our instructions and loses weight without  improvement of her shortness of breath, we will plan to refer to pulmonology. We will monitor this condition regularly. Kathy Clark agrees to this plan.  Vitamin D Deficiency Kathy Clark was informed that low vitamin D levels contributes to fatigue and are associated with obesity, breast, and colon cancer. She agrees to continue to take prescription Vit D @50 ,000 IU every week and will check labs and will follow up for routine testing of vitamin D, at least 2-3 times per year. She was informed of the risk of over-replacement of vitamin D and agrees to not increase her dose unless he discusses this with [place] first.  Hypertension We discussed sodium restriction, working on healthy weight loss, with goal to lose weight to control blood pressure without medications and a regular exercise program as the means to achieve improved blood pressure control. Kathy Clark agreed with this plan and agreed to follow up as directed. We will check labs and continue to monitor her blood pressure as well as her progress with the above lifestyle modifications. She will continue her medications as prescribed and will watch for signs of hypotension as he continues his lifestyle modifications.  Cardiovascular risk counselling Kathy Clark was given extended (at least 15 minutes) coronary artery disease prevention counseling today. She is 56 y.o. female and has risk factors for heart disease including obesity. We discussed intensive lifestyle modifications today with an emphasis on specific weight loss instructions and strategies. Kathy Clark was also informed of the importance of increasing exercise and decreasing saturated fats to help prevent heart disease.  Depression Screen [place] had a positive depression screening. Depression is commonly associated with obesity and often results in emotional eating behaviors. We will monitor this closely and work on CBT to help improve the non-hunger  eating patterns. Referral to Psychology may be required if no improvement is seen as she continues in our clinic.  Obesity Kathy Clark is currently in the action stage of change and her goal is to continue with weight loss efforts She has agreed to follow the Category 3 plan Kathy Clark has been instructed to work up to a goal of 150 minutes of combined cardio and strengthening exercise per week for weight loss and overall health benefits. We discussed the following Behavioral Modification Stratagies today: increasing lean protein intake and decreasing simple carbohydrates   Betta has agreed to follow up with our clinic in 2 weeks. She was informed of the importance of frequent follow up visits to maximize her success with intensive lifestyle modifications for her multiple health conditions. She was informed we would discuss her lab results at her next visit unless there is a critical issue that needs to be addressed sooner. Kathy Clark agreed to keep her next visit at the agreed upon time to discuss these results.  I, Doreene Nest, am acting as scribe for Dennard Nip, MD  I have reviewed the above documentation for accuracy and completeness, and I agree with the above. -Dennard Nip, MD

## 2016-08-04 LAB — HEMOGLOBIN A1C
Est. average glucose Bld gHb Est-mCnc: 94 mg/dL
Hgb A1c MFr Bld: 4.9 % (ref 4.8–5.6)

## 2016-08-04 LAB — LIPID PANEL WITH LDL/HDL RATIO
Cholesterol, Total: 236 mg/dL — ABNORMAL HIGH (ref 100–199)
HDL: 61 mg/dL (ref >39)
LDL Calculated: 156 mg/dL — ABNORMAL HIGH (ref 0–99)
LDL/HDL RATIO: 2.6 ratio (ref 0.0–3.2)
Triglycerides: 97 mg/dL (ref 0–149)
VLDL Cholesterol Cal: 19 mg/dL (ref 5–40)

## 2016-08-04 LAB — CBC WITH DIFFERENTIAL
BASOS: 0 %
Basophils Absolute: 0 10*3/uL (ref 0.0–0.2)
EOS (ABSOLUTE): 0.2 10*3/uL (ref 0.0–0.4)
EOS: 3 %
Hematocrit: 41.3 % (ref 34.0–46.6)
Hemoglobin: 13.6 g/dL (ref 11.1–15.9)
IMMATURE GRANS (ABS): 0 10*3/uL (ref 0.0–0.1)
Immature Granulocytes: 0 %
LYMPHS: 28 %
Lymphocytes Absolute: 1.8 10*3/uL (ref 0.7–3.1)
MCH: 29.8 pg (ref 26.6–33.0)
MCHC: 32.9 g/dL (ref 31.5–35.7)
MCV: 91 fL (ref 79–97)
Monocytes Absolute: 0.5 10*3/uL (ref 0.1–0.9)
Monocytes: 8 %
NEUTROS PCT: 61 %
Neutrophils Absolute: 3.9 10*3/uL (ref 1.4–7.0)
RBC: 4.56 x10E6/uL (ref 3.77–5.28)
RDW: 12.9 % (ref 12.3–15.4)
WBC: 6.4 10*3/uL (ref 3.4–10.8)

## 2016-08-04 LAB — TSH: TSH: 1.47 u[IU]/mL (ref 0.450–4.500)

## 2016-08-04 LAB — COMPREHENSIVE METABOLIC PANEL
ALK PHOS: 58 IU/L (ref 39–117)
ALT: 33 IU/L — ABNORMAL HIGH (ref 0–32)
AST: 26 IU/L (ref 0–40)
Albumin/Globulin Ratio: 1.9 (ref 1.2–2.2)
Albumin: 4.6 g/dL (ref 3.5–5.5)
BUN/Creatinine Ratio: 20 (ref 9–23)
BUN: 12 mg/dL (ref 6–24)
Bilirubin Total: 0.7 mg/dL (ref 0.0–1.2)
CO2: 26 mmol/L (ref 18–29)
CREATININE: 0.61 mg/dL (ref 0.57–1.00)
Calcium: 9.1 mg/dL (ref 8.7–10.2)
Chloride: 98 mmol/L (ref 96–106)
GFR calc Af Amer: 117 mL/min/{1.73_m2} (ref >59)
GFR calc non Af Amer: 102 mL/min/{1.73_m2} (ref >59)
Globulin, Total: 2.4 g/dL (ref 1.5–4.5)
Glucose: 75 mg/dL (ref 65–99)
Potassium: 3.7 mmol/L (ref 3.5–5.2)
SODIUM: 139 mmol/L (ref 134–144)
Total Protein: 7 g/dL (ref 6.0–8.5)

## 2016-08-04 LAB — INSULIN, RANDOM: INSULIN: 5.9 u[IU]/mL (ref 2.6–24.9)

## 2016-08-04 LAB — VITAMIN B12: Vitamin B-12: 793 pg/mL (ref 232–1245)

## 2016-08-04 LAB — VITAMIN D 25 HYDROXY (VIT D DEFICIENCY, FRACTURES): Vit D, 25-Hydroxy: 31.1 ng/mL (ref 30.0–100.0)

## 2016-08-04 LAB — T4, FREE: FREE T4: 1.16 ng/dL (ref 0.82–1.77)

## 2016-08-04 LAB — FOLATE: FOLATE: 12.5 ng/mL (ref >3.0)

## 2016-08-04 LAB — T3: T3 TOTAL: 119 ng/dL (ref 71–180)

## 2016-08-05 MED FILL — FLUoxetine HCL 20 MG TABS: 20 | 90 days supply | Qty: 135 | Fill #1

## 2016-08-09 ENCOUNTER — Encounter: Payer: Self-pay | Admitting: Sports Medicine

## 2016-08-17 ENCOUNTER — Encounter (INDEPENDENT_AMBULATORY_CARE_PROVIDER_SITE_OTHER): Payer: Self-pay | Admitting: Family Medicine

## 2016-08-17 ENCOUNTER — Encounter: Payer: Self-pay | Admitting: Physician Assistant

## 2016-08-17 ENCOUNTER — Ambulatory Visit (INDEPENDENT_AMBULATORY_CARE_PROVIDER_SITE_OTHER): Payer: 59 | Admitting: Sports Medicine

## 2016-08-17 ENCOUNTER — Ambulatory Visit (INDEPENDENT_AMBULATORY_CARE_PROVIDER_SITE_OTHER): Payer: 59 | Admitting: Family Medicine

## 2016-08-17 ENCOUNTER — Other Ambulatory Visit: Payer: Self-pay

## 2016-08-17 VITALS — BP 122/78 | HR 69 | Temp 98.5°F | Ht 68.0 in | Wt 229.0 lb

## 2016-08-17 DIAGNOSIS — G5603 Carpal tunnel syndrome, bilateral upper limbs: Secondary | ICD-10-CM

## 2016-08-17 DIAGNOSIS — Z6834 Body mass index (BMI) 34.0-34.9, adult: Secondary | ICD-10-CM | POA: Diagnosis not present

## 2016-08-17 DIAGNOSIS — R945 Abnormal results of liver function studies: Secondary | ICD-10-CM

## 2016-08-17 DIAGNOSIS — Z6838 Body mass index (BMI) 38.0-38.9, adult: Secondary | ICD-10-CM | POA: Insufficient documentation

## 2016-08-17 DIAGNOSIS — Z9189 Other specified personal risk factors, not elsewhere classified: Secondary | ICD-10-CM

## 2016-08-17 DIAGNOSIS — E669 Obesity, unspecified: Secondary | ICD-10-CM | POA: Diagnosis not present

## 2016-08-17 DIAGNOSIS — R7989 Other specified abnormal findings of blood chemistry: Secondary | ICD-10-CM | POA: Insufficient documentation

## 2016-08-17 DIAGNOSIS — E785 Hyperlipidemia, unspecified: Secondary | ICD-10-CM

## 2016-08-17 DIAGNOSIS — E559 Vitamin D deficiency, unspecified: Secondary | ICD-10-CM

## 2016-08-17 DIAGNOSIS — E6609 Other obesity due to excess calories: Secondary | ICD-10-CM | POA: Insufficient documentation

## 2016-08-17 MED ORDER — OMEPRAZOLE 40 MG PO CPDR: 40.0000 mg | DELAYED_RELEASE_CAPSULE | Freq: Every day | ORAL | 2 refills | Status: DC

## 2016-08-17 MED ORDER — VITAMIN D (ERGOCALCIFEROL) 1.25 MG (50000 UNIT) PO CAPS: 50000.0000 [IU] | ORAL_CAPSULE | ORAL | 0 refills | Status: DC

## 2016-08-17 MED FILL — GABAPENTIN 300 MG CAPSULE: 300 | 90 days supply | Qty: 180 | Fill #3

## 2016-08-17 MED FILL — VIT D2 1.25 MG (50,000 UNIT: 1.25 MG | 28 days supply | Qty: 4 | Fill #0

## 2016-08-17 MED FILL — OMEPRAZOLE DR 40 MG CAPSULE: 40 | 30 days supply | Qty: 30 | Fill #0

## 2016-08-17 NOTE — Progress Notes (Signed)
Office: [phone]  /  Fax: [phone]   HPI:   Chief Complaint: OBESITY Kathy Clark is here to discuss her progress with her obesity treatment plan. She is following her eating plan approximately 95 % of the time and states she is exercising 0 minutes 0 times per week. Kathy Clark has done very well with her weight loss efforts. She has followed the plan closely. Her hunger is controlled on the category 3 plan but she finds the lunches boring. Kathy Clark is currently struggling with some family sabotage and workplace temptations.Kathy Clark  Her weight is 229 lb (103.9 kg) today and has had a weight loss of 8 pounds over a period of 2 weeks since her last visit. She has lost 8 lbs since starting treatment with [place].  Vitamin D deficiency Kathy Clark has a diagnosis of vitamin D deficiency. Her vitamin D level is low at 31. She is not currently taking vit D. She admits fatigue and denies nausea, vomiting or muscle weakness.   Elevated Liver Function Tests Kathy Clark has a new diagnosis of elevated liver function tests. She denies abdominal pain.Kathy Clark has gained weight over the holidays and her last ALT was within normal limits.  Hyperlipidemia Kathy Clark has hyperlipidemia, her LDL is elevated and she has a strong family history of hyperlipidemia. She has been trying to improve her cholesterol levels with intensive lifestyle modification including a low saturated fat diet, exercise and weight loss. She would like to try to control with diet. She denies any chest pain, claudication or myalgias.  At risk for cardiovascular disease Kathy Clark is at a higher than average risk for cardiovascular disease due to obesity and HLD. She currently denies any chest pain.   Wt Readings from Last 500 Encounters:  08/17/16 229 lb (103.9 kg)  08/03/16 237 lb (107.5 kg)  07/10/16 241 lb (109.3 kg)  07/08/16 241 lb 4 oz (109.4 kg)  05/19/16 232 lb (105.2 kg)  04/06/16 236 lb 3.2 oz (107.1 kg)  03/26/16 237 lb (107.5 kg)  02/20/16 235 lb (106.6  kg)  01/21/16 232 lb (105.2 kg)  12/25/15 230 lb (104.3 kg)  12/18/15 228 lb (103.4 kg)  11/06/15 223 lb (101.2 kg)  10/23/15 223 lb 12.8 oz (101.5 kg)  10/15/15 220 lb (99.8 kg)  08/23/15 220 lb 8 oz (100 kg)  07/26/15 217 lb (98.4 kg)  07/11/15 222 lb (100.7 kg)  06/13/15 217 lb (98.4 kg)  04/23/15 220 lb (99.8 kg)  03/12/15 217 lb (98.4 kg)  01/22/15 216 lb (98 kg)  12/24/14 224 lb (101.6 kg)  09/28/14 222 lb (100.7 kg)  08/07/14 226 lb (102.5 kg)  06/21/14 235 lb (106.6 kg)  03/14/14 226 lb (102.5 kg)  12/14/13 221 lb (100.2 kg)  08/14/13 217 lb (98.4 kg)  06/19/13 210 lb (95.3 kg)  05/19/13 210 lb (95.3 kg)  04/21/13 219 lb (99.3 kg)  03/24/13 214 lb (97.1 kg)  03/10/13 213 lb (96.6 kg)  02/24/13 215 lb (97.5 kg)  01/26/13 219 lb (99.3 kg)  04/19/12 211 lb (95.7 kg)  01/01/12 208 lb (94.3 kg)  11/03/11 214 lb (97.1 kg)  10/05/11 218 lb (98.9 kg)  07/27/11 210 lb (95.3 kg)  07/20/11 218 lb (98.9 kg)  07/17/11 217 lb (98.4 kg)  07/15/11 216 lb (98 kg)  03/04/11 210 lb (95.3 kg)  01/13/11 210 lb (95.3 kg)  02/03/10 218 lb (98.9 kg)  12/12/09 (!) 225 lb (102.1 kg)  10/22/09 (!) 226 lb (102.5 kg)  12/05/08 221 lb 4  oz (100.4 kg)  11/05/08 220 lb (99.8 kg)  05/31/08 212 lb (96.2 kg)  01/09/08 213 lb 4 oz (96.7 kg)  11/10/07 216 lb (98 kg)  05/20/07 (!) 229 lb (103.9 kg)  02/01/07 (!) 225 lb (102.1 kg)  12/31/06 (!) 225 lb (102.1 kg)  12/21/06 (!) 225 lb (102.1 kg)  11/23/06 (!) 231 lb (104.8 kg)  05/19/06 (!) 224 lb (101.6 kg)     ALLERGIES: Allergies  Allergen Reactions  . Succinylcholine Anaphylaxis  . Dilaudid [Hydromorphone Hcl] Nausea And Vomiting    MEDICATIONS: Current Outpatient Prescriptions on File Prior to Visit  Medication Sig Dispense Refill  . gabapentin (NEURONTIN) 300 MG capsule Take 300 mg by mouth 2 (two) times daily.     Kathy Clark losartan-hydrochlorothiazide (HYZAAR) 50-12.5 MG tablet daily at 2 PM.  3  . Melatonin 3 MG TABS Take by mouth  as directed.    . Multiple Vitamins-Minerals (MULTIVITAMIN ADULT PO) Take by mouth daily.    . mupirocin ointment (BACTROBAN) 2 % Place 1 application into the nose 2 (two) times daily. 22 g 0  . omeprazole (PRILOSEC) 40 MG capsule Take 1 capsule (40 mg total) by mouth 2 (two) times daily. 60 capsule 2  . ranitidine (ZANTAC) 300 MG tablet Take 1 tablet (300 mg total) by mouth 2 (two) times daily. 180 tablet 2  . SUMAtriptan (IMITREX) 20 MG/ACT nasal spray PLACE 1 SPRAY INTO THE NOSE EVERY 2 HOURS AS NEEDED FOR. HEADACHE 6 Inhaler 2  . FLUoxetine (PROZAC) 20 MG tablet Take 1.5 tablets (30 mg total) by mouth daily. (Patient not taking: Reported on 57/11/2016) 135 tablet 1  . [DISCONTINUED] rivaroxaban (XARELTO) 10 MG TABS tablet Take 1 tablet (10 mg total) by mouth daily with breakfast. 18 tablet 0   Current Facility-Administered Medications on File Prior to Visit  Medication Dose Route Frequency Provider Last Rate Last Dose  . 0.9 %  sodium chloride infusion  500 mL Intravenous Continuous Ladene Artist, MD        PAST MEDICAL HISTORY: Past Medical History:  Diagnosis Date  . Anxiety   . Back pain   . 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 Depression   . Essential hypertension, benign   . Gastritis   . GERD (gastroesophageal reflux disease)   . Hypertension   . Joint pain   . Lymphocytic colitis    microscopic  . Migraines   . Osteoarthritis   . Sciatica    right leg  . Swelling    feet, left foot    PAST SURGICAL HISTORY: Past Surgical History:  Procedure Laterality Date  . ABDOMINAL HYSTERECTOMY  07-2005  . BACK SURGERY    . KNEE SURGERY  2006   left; multiple  . microdiscetomy  2005  . SHOULDER SURGERY  07-22-09   right  . SKIN CANCER EXCISION     eyelid  . TOTAL KNEE ARTHROPLASTY  07/27/2011   Procedure: TOTAL KNEE ARTHROPLASTY;  Surgeon: Gearlean Alf;  Location: [facility];  Service: Orthopedics;  Laterality: Left;    SOCIAL HISTORY: Social  History  Substance Use Topics  . Smoking status: Never Smoker  . Smokeless tobacco: Never Used  . Alcohol use Yes     Comment: occasionally    FAMILY HISTORY: Family History  Problem Relation Age of Onset  . Breast cancer    . Tongue cancer Mother   . Breast cancer Mother   . Anxiety disorder Mother   .  Depression Mother   . Heart disease Father   . Hypertension Father   . Hyperlipidemia Father   . Stroke Paternal Grandmother   . Colon cancer Neg Hx     ROS: Review of Systems  Constitutional: Positive for malaise/fatigue and weight loss.       Negative Jaundice  Cardiovascular: Negative for chest pain and claudication.  Gastrointestinal: Negative for abdominal pain, nausea and vomiting.  Musculoskeletal: Negative for myalgias.       Negative Muscle Weakness    PHYSICAL EXAM: Blood pressure 122/78, pulse 69, temperature 98.5 F (36.9 C), temperature source Oral, height 5\' 8"  (1.727 m), weight 229 lb (103.9 kg), SpO2 98 %. Body mass index is 34.82 kg/m. Physical Exam  Constitutional: She is oriented to person, place, and time. She appears well-developed and well-nourished.  Cardiovascular: Normal rate.   Pulmonary/Chest: Effort normal.  Musculoskeletal: Normal range of motion. She exhibits edema (Trace edema bilateral lower extremeties).  Neurological: She is oriented to person, place, and time.  Skin: Skin is warm and dry.  Psychiatric: She has a normal mood and affect. Her behavior is normal.  Vitals reviewed.   RECENT LABS AND TESTS: BMET    Component Value Date/Time   NA 139 08/03/2016 1458   K 3.7 08/03/2016 1458   CL 98 08/03/2016 1458   CO2 26 08/03/2016 1458   GLUCOSE 75 08/03/2016 1458   GLUCOSE 79 03/26/2016 1600   BUN 12 08/03/2016 1458   CREATININE 0.61 08/03/2016 1458   CREATININE 0.61 03/26/2016 1600   CALCIUM 9.1 08/03/2016 1458   GFRNONAA 102 08/03/2016 1458   GFRNONAA >89 03/26/2016 1600   GFRAA 117 08/03/2016 1458   GFRAA >89  03/26/2016 1600   Lab Results  Component Value Date   HGBA1C 4.9 08/03/2016   Lab Results  Component Value Date   INSULIN 5.9 08/03/2016   CBC    Component Value Date/Time   WBC 6.4 08/03/2016 1458   WBC 6.7 03/26/2016 1600   RBC 4.56 08/03/2016 1458   RBC 4.68 03/26/2016 1600   HGB 14.3 03/26/2016 1600   HCT 41.3 08/03/2016 1458   PLT 287 03/26/2016 1600   MCV 91 08/03/2016 1458   MCH 29.8 08/03/2016 1458   MCH 30.6 03/26/2016 1600   MCHC 32.9 08/03/2016 1458   MCHC 33.6 03/26/2016 1600   RDW 12.9 08/03/2016 1458   LYMPHSABS 1.8 08/03/2016 1458   MONOABS 469 03/26/2016 1600   EOSABS 0.2 08/03/2016 1458   BASOSABS 0.0 08/03/2016 1458   Iron/TIBC/Ferritin/ %Sat    Component Value Date/Time   FERRITIN 142 12/25/2015 0912   Lipid Panel     Component Value Date/Time   CHOL 236 (H) 08/03/2016 1458   TRIG 97 08/03/2016 1458   HDL 61 08/03/2016 1458   CHOLHDL 3.0 04/05/2015 0937   VLDL 21 04/05/2015 0937   LDLCALC 156 (H) 08/03/2016 1458   Hepatic Function Panel     Component Value Date/Time   PROT 7.0 08/03/2016 1458   ALBUMIN 4.6 08/03/2016 1458   AST 26 08/03/2016 1458   ALT 33 (H) 08/03/2016 1458   ALKPHOS 58 08/03/2016 1458   BILITOT 0.7 08/03/2016 1458   BILIDIR 0.1 12/25/2015 0912   IBILI 0.5 12/25/2015 0912      Component Value Date/Time   TSH 1.470 08/03/2016 1458   TSH 2.09 03/26/2016 1600   TSH 3.10 12/25/2015 0912    ASSESSMENT AND PLAN: Vitamin D deficiency - Plan: Vitamin D, Ergocalciferol, (DRISDOL) 50000 units  CAPS capsule  Elevated liver function tests  Hyperlipidemia, unspecified hyperlipidemia type  At risk for heart disease  Class 1 obesity without serious comorbidity with body mass index (BMI) of 34.0 to 34.9 in adult, unspecified obesity type  PLAN:  Vitamin D Deficiency Deaundrea was informed that low vitamin D levels contributes to fatigue and are associated with obesity, breast, and colon cancer. She agrees to start to take  prescription Vit D @50 ,000 IU every week #4 with no refills and will re-check her labs in 3 months and will follow up for routine testing of vitamin D, at least 2-3 times per year. She was informed of the risk of over-replacement of vitamin D and agrees to not increase her dose unless he discusses this with [place] first.  Elevated Liver Function Tests Elevated LFT Likely due to Non Alcoholic Fatty Liver Disease. Dinora will continue to work on weight loss and we will re-check labs in 3 months. She may need a further work-up if no improvement at that time.  Hyperlipidemia Rajah was informed of the American Heart Association Guidelines emphasizing intensive lifestyle modifications as the first line treatment for hyperlipidemia. We discussed many lifestyle modifications today in depth, and Flavia will continue to work on decreasing saturated fats such as fatty red meat, butter and many fried foods. She will also increase vegetables and lean protein in her diet and continue to work on exercise and weight loss efforts. We will re-check labs in 3 months and follow.  Cardiovascular risk counselling Arlett was given extended (at least 15 minutes) coronary artery disease prevention counseling today. She is 56 y.o. female and has risk factors for heart disease including obesity. We discussed intensive lifestyle modifications today with an emphasis on specific weight loss instructions and strategies. Pt was also informed of the importance of increasing exercise and decreasing saturated fats to help prevent heart disease.  Obesity Karna is currently in the action stage of change. As such, her goal is to continue with weight loss efforts She has agreed to keep a food journal with 300-450 calories and 25 grams of protein at lunch and follow the Category 3 plan for the other meals. Kree has been instructed to work up to a goal of 150 minutes of combined cardio and strengthening exercise per week for weight loss and  overall health benefits. We discussed the following Behavioral Modification Stratagies today: increasing lean protein intake, decreasing simple carbohydrates , work on meal planning and easy cooking plans and ways to avoid night time snacking  Kaye has agreed to follow up with our clinic in 2 weeks. She was informed of the importance of frequent follow up visits to maximize her success with intensive lifestyle modifications for her multiple health conditions.  I, Doreene Nest, am acting as scribe for Dennard Nip, MD  I have reviewed the above documentation for accuracy and completeness, and I agree with the above. -Dennard Nip, MD

## 2016-08-17 NOTE — Progress Notes (Signed)
  Subjective:    CC: Carpal tunnel syndrome  HPI: This is a pleasant 37 her old female, she works in Engineer, technical sales with [employer], she has known carpal tunnel syndrome, previous injections provided a year of relief. Her most recent injection provided approximately 5-6 months of relief. Now having recurrence of pain, moderate, persistent. Carpal tunnel and median nerve distribution, desires repeat interventional treatment today.  Past medical history:  Negative.  See flowsheet/record as well for more information.  Surgical history: Negative.  See flowsheet/record as well for more information.  Family history: Negative.  See flowsheet/record as well for more information.  Social history: Negative.  See flowsheet/record as well for more information.  Allergies, and medications have been entered into the medical record, reviewed, and no changes needed.   Review of Systems: No fevers, chills, night sweats, weight loss, chest pain, or shortness of breath.   Objective:    General: Well Developed, well nourished, and in no acute distress.  Neuro: Alert and oriented x3, extra-ocular muscles intact, sensation grossly intact.  HEENT: Normocephalic, atraumatic, pupils equal round reactive to light, neck supple, no masses, no lymphadenopathy, thyroid nonpalpable.  Skin: Warm and dry, no rashes. Cardiac: Regular rate and rhythm, no murmurs rubs or gallops, no lower extremity edema.  Respiratory: Clear to auscultation bilaterally. Not using accessory muscles, speaking in full sentences.  Procedure: Real-time Ultrasound Guided right median nerve hydrodissection Device: GE Logiq E  Verbal informed consent obtained.  Time-out conducted.  Noted no overlying erythema, induration, or other signs of local infection.  Skin prepped in a sterile fashion.  Local anesthesia: Topical Ethyl chloride.  With sterile technique and under real time ultrasound guidance:  Using a total of 1 mL kenalog 40, 5 mL lidocaine injected  medication both superficial to and deep to the median nerve in the carpal tunnel freeing it from surrounding structures, I then redirected and injected further medication deep around the flexor tendons. Completed without difficulty  Pain immediately resolved suggesting accurate placement of the medication.  Advised to call if fevers/chills, erythema, induration, drainage, or persistent bleeding.  Images permanently stored and available for review in the ultrasound unit.  Impression: Technically successful ultrasound guided injection.  Procedure: Real-time Ultrasound Guided left median nerve hydrodissection Device: GE Logiq E  Verbal informed consent obtained.  Time-out conducted.  Noted no overlying erythema, induration, or other signs of local infection.  Skin prepped in a sterile fashion.  Local anesthesia: Topical Ethyl chloride.  With sterile technique and under real time ultrasound guidance:  Using a total of 1 mL kenalog 40, 5 mL lidocaine injected medication both superficial to and deep to the median nerve in the carpal tunnel freeing it from surrounding structures, I then redirected and injected further medication deep around the flexor tendons. Completed without difficulty  Pain immediately resolved suggesting accurate placement of the medication.  Advised to call if fevers/chills, erythema, induration, drainage, or persistent bleeding.  Images permanently stored and available for review in the ultrasound unit.  Impression: Technically successful ultrasound guided injection.  Impression and Recommendations:    No problem-specific Assessment & Plan notes found for this encounter.

## 2016-08-17 NOTE — Assessment & Plan Note (Addendum)
Bilateral carpal tunnel/median nerve hydrodissection, return as needed. I told her she can remove the plastic splint from her wrist braces if she feels as though it is pressing on the nerve at night.

## 2016-08-25 ENCOUNTER — Telehealth: Payer: 59 | Admitting: Family

## 2016-08-25 DIAGNOSIS — B373 Candidiasis of vulva and vagina: Secondary | ICD-10-CM

## 2016-08-25 DIAGNOSIS — B3731 Acute candidiasis of vulva and vagina: Secondary | ICD-10-CM

## 2016-08-25 MED ORDER — FLUCONAZOLE 150 MG PO TABS: 150.0000 mg | ORAL_TABLET | Freq: Once | ORAL | 0 refills | Status: AC

## 2016-08-25 MED FILL — FLUCONAZOLE 150 MG TABLET: 150 | 1 days supply | Qty: 1 | Fill #0

## 2016-08-25 NOTE — Progress Notes (Signed)

## 2016-08-31 ENCOUNTER — Ambulatory Visit (INDEPENDENT_AMBULATORY_CARE_PROVIDER_SITE_OTHER): Payer: 59 | Admitting: Family Medicine

## 2016-08-31 VITALS — BP 116/82 | HR 67 | Temp 97.9°F | Ht 68.0 in | Wt 224.0 lb

## 2016-08-31 DIAGNOSIS — F3289 Other specified depressive episodes: Secondary | ICD-10-CM

## 2016-08-31 DIAGNOSIS — E559 Vitamin D deficiency, unspecified: Secondary | ICD-10-CM

## 2016-08-31 DIAGNOSIS — E669 Obesity, unspecified: Secondary | ICD-10-CM

## 2016-08-31 DIAGNOSIS — Z6834 Body mass index (BMI) 34.0-34.9, adult: Secondary | ICD-10-CM | POA: Diagnosis not present

## 2016-08-31 NOTE — Progress Notes (Signed)
Office: [phone]  /  Fax: [phone]   HPI:   Chief Complaint: OBESITY Kathy Clark is here to discuss her progress with her obesity treatment plan. She is following her eating plan approximately 98 % of the time and states she is walking occasionally 0 minutes 0 times per week. Kathy Clark has done well with weight loss. She mostly following the category 3 plan pretty well on and off for lunch. She notes some work and home sabotage but is making strategies to deal with this. She is not drinking liquids with calories and she is trying to decrease eating out.  Her weight is 224 lb (101.6 kg) today and has had a weight loss of 5 pounds over a period of 2 weeks since her last visit. She has lost 17 lbs since starting treatment with [place].  Vitamin D deficiency Kathy Clark has a diagnosis of vitamin D deficiency. She is currently taking vit D and not yet at goal. Fatigue is improving and she denies nausea, vomiting or muscle weakness.  Depression with emotional eating behaviors Kathy Clark is struggling with emotional eating and using food for comfort to the extent that it is negatively impacting her health. She often snacks when she is not hungry. Kathy Clark sometimes feels she is out of control and then feels guilty that she made poor food choices. She is currently on Prozac, she still notes night awakenings and poor quality sleep. Her mood is stable but she is struggling with emotional eating, especially in the past 2 weeks. She has been working on behavior modification techniques to help reduce her emotional eating and has been somewhat successful. She shows no sign of suicidal or homicidal ideations.  Depression screen [facility] 2/9 08/03/2016 01/21/2016 08/26/2015 07/11/2015  Decreased Interest 3 0 1 1  Down, Depressed, Hopeless 3 0 1 2  PHQ - 2 Score 6 0 2 3  Altered sleeping 3 2 2 2   Tired, decreased energy 3 2 2 2   Change in appetite 3 1 0 2  Feeling bad or failure about yourself  3 0 0 1  Trouble concentrating 2 1 0 1   Moving slowly or fidgety/restless 0 0 0 0  Suicidal thoughts 0 0 0 0  PHQ-9 Score 20 6 6 11   Difficult doing work/chores - Somewhat difficult Somewhat difficult Somewhat difficult     Wt Readings from Last 500 Encounters:  08/31/16 224 lb (101.6 kg)  08/17/16 238 lb 6.4 oz (108.1 kg)  08/17/16 229 lb (103.9 kg)  08/03/16 237 lb (107.5 kg)  07/10/16 241 lb (109.3 kg)  07/08/16 241 lb 4 oz (109.4 kg)  05/19/16 232 lb (105.2 kg)  04/06/16 236 lb 3.2 oz (107.1 kg)  03/26/16 237 lb (107.5 kg)  02/20/16 235 lb (106.6 kg)  01/21/16 232 lb (105.2 kg)  12/25/15 230 lb (104.3 kg)  12/18/15 228 lb (103.4 kg)  11/06/15 223 lb (101.2 kg)  10/23/15 223 lb 12.8 oz (101.5 kg)  10/15/15 220 lb (99.8 kg)  08/23/15 220 lb 8 oz (100 kg)  07/26/15 217 lb (98.4 kg)  07/11/15 222 lb (100.7 kg)  06/13/15 217 lb (98.4 kg)  04/23/15 220 lb (99.8 kg)  03/12/15 217 lb (98.4 kg)  01/22/15 216 lb (98 kg)  12/24/14 224 lb (101.6 kg)  09/28/14 222 lb (100.7 kg)  08/07/14 226 lb (102.5 kg)  06/21/14 235 lb (106.6 kg)  03/14/14 226 lb (102.5 kg)  12/14/13 221 lb (100.2 kg)  08/14/13 217 lb (98.4 kg)  06/19/13 210  lb (95.3 kg)  05/19/13 210 lb (95.3 kg)  04/21/13 219 lb (99.3 kg)  03/24/13 214 lb (97.1 kg)  03/10/13 213 lb (96.6 kg)  02/24/13 215 lb (97.5 kg)  01/26/13 219 lb (99.3 kg)  04/19/12 211 lb (95.7 kg)  01/01/12 208 lb (94.3 kg)  11/03/11 214 lb (97.1 kg)  10/05/11 218 lb (98.9 kg)  07/27/11 210 lb (95.3 kg)  07/20/11 218 lb (98.9 kg)  07/17/11 217 lb (98.4 kg)  07/15/11 216 lb (98 kg)  03/04/11 210 lb (95.3 kg)  01/13/11 210 lb (95.3 kg)  02/03/10 218 lb (98.9 kg)  12/12/09 (!) 225 lb (102.1 kg)  10/22/09 (!) 226 lb (102.5 kg)  12/05/08 221 lb 4 oz (100.4 kg)  11/05/08 220 lb (99.8 kg)  05/31/08 212 lb (96.2 kg)  01/09/08 213 lb 4 oz (96.7 kg)  11/10/07 216 lb (98 kg)  05/20/07 (!) 229 lb (103.9 kg)  02/01/07 (!) 225 lb (102.1 kg)  12/31/06 (!) 225 lb (102.1 kg)  12/21/06  (!) 225 lb (102.1 kg)  11/23/06 (!) 231 lb (104.8 kg)  05/19/06 (!) 224 lb (101.6 kg)     ALLERGIES: Allergies  Allergen Reactions  . Succinylcholine Anaphylaxis  . Dilaudid [Hydromorphone Hcl] Nausea And Vomiting    MEDICATIONS: Current Outpatient Prescriptions on File Prior to Visit  Medication Sig Dispense Refill  . estradiol (VIVELLE-DOT) 0.1 MG/24HR patch Place 1 patch onto the skin 2 (two) times a week.    [name] FLUoxetine (PROZAC) 20 MG tablet Take 1.5 tablets (30 mg total) by mouth daily. 135 tablet 1  . gabapentin (NEURONTIN) 300 MG capsule Take 300 mg by mouth 2 (two) times daily.     [name] losartan-hydrochlorothiazide (HYZAAR) 50-12.5 MG tablet daily at 2 PM.  3  . Melatonin 3 MG TABS Take by mouth as directed.    . Multiple Vitamins-Minerals (MULTIVITAMIN ADULT PO) Take by mouth daily.    . mupirocin ointment (BACTROBAN) 2 % Place 1 application into the nose 2 (two) times daily. 22 g 0  . omeprazole (PRILOSEC) 40 MG capsule Take 1 capsule (40 mg total) by mouth daily. 30 capsule 2  . ranitidine (ZANTAC) 300 MG tablet Take 1 tablet (300 mg total) by mouth 2 (two) times daily. 180 tablet 2  . SUMAtriptan (IMITREX) 20 MG/ACT nasal spray PLACE 1 SPRAY INTO THE NOSE EVERY 2 HOURS AS NEEDED FOR. HEADACHE 6 Inhaler 2  . Vitamin D, Ergocalciferol, (DRISDOL) 50000 units CAPS capsule Take 1 capsule (50,000 Units total) by mouth every 7 (seven) days. 4 capsule 0  . [DISCONTINUED] rivaroxaban (XARELTO) 10 MG TABS tablet Take 1 tablet (10 mg total) by mouth daily with breakfast. 18 tablet 0   No current facility-administered medications on file prior to visit.     PAST MEDICAL HISTORY: Past Medical History:  Diagnosis Date  . Anxiety   . Back pain   . 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 Depression   . Essential hypertension, benign   . Gastritis   . GERD (gastroesophageal reflux disease)   . Hypertension   . Joint pain   . Lymphocytic colitis     microscopic  . Migraines   . Osteoarthritis   . Sciatica    right leg  . Swelling    feet, left foot    PAST SURGICAL HISTORY: Past Surgical History:  Procedure Laterality Date  . ABDOMINAL HYSTERECTOMY  07-2005  . BACK SURGERY    . KNEE  SURGERY  2006   left; multiple  . microdiscetomy  2005  . SHOULDER SURGERY  07-22-09   right  . SKIN CANCER EXCISION     eyelid  . TOTAL KNEE ARTHROPLASTY  07/27/2011   Procedure: TOTAL KNEE ARTHROPLASTY;  Surgeon: Gearlean Alf;  Location: [facility];  Service: Orthopedics;  Laterality: Left;    SOCIAL HISTORY: Social History  Substance Use Topics  . Smoking status: Never Smoker  . Smokeless tobacco: Never Used  . Alcohol use Yes     Comment: occasionally    FAMILY HISTORY: Family History  Problem Relation Age of Onset  . Breast cancer    . Tongue cancer Mother   . Breast cancer Mother   . Anxiety disorder Mother   . Depression Mother   . Heart disease Father   . Hypertension Father   . Hyperlipidemia Father   . Stroke Paternal Grandmother   . Colon cancer Neg Hx     ROS: Review of Systems  Constitutional: Positive for malaise/fatigue and weight loss.  Gastrointestinal: Negative for nausea and vomiting.  Musculoskeletal:       Negative muscle weakness  Psychiatric/Behavioral: Positive for depression. Negative for suicidal ideas. The patient has insomnia.     PHYSICAL EXAM: Blood pressure 116/82, pulse 67, temperature 97.9 F (36.6 C), temperature source Oral, height 5\' 8"  (1.727 m), weight 224 lb (101.6 kg), SpO2 98 %. Body mass index is 34.06 kg/m. Physical Exam  Constitutional: She is oriented to person, place, and time. She appears well-developed and well-nourished.  Cardiovascular: Normal rate.   Pulmonary/Chest: Effort normal.  Musculoskeletal: Normal range of motion.  Neurological: She is oriented to person, place, and time.  Skin: Skin is warm and dry.  Psychiatric: She has a normal mood and affect. Her  behavior is normal.  Vitals reviewed.   RECENT LABS AND TESTS: BMET    Component Value Date/Time   NA 139 08/03/2016 1458   K 3.7 08/03/2016 1458   CL 98 08/03/2016 1458   CO2 26 08/03/2016 1458   GLUCOSE 75 08/03/2016 1458   GLUCOSE 79 03/26/2016 1600   BUN 12 08/03/2016 1458   CREATININE 0.61 08/03/2016 1458   CREATININE 0.61 03/26/2016 1600   CALCIUM 9.1 08/03/2016 1458   GFRNONAA 102 08/03/2016 1458   GFRNONAA >89 03/26/2016 1600   GFRAA 117 08/03/2016 1458   GFRAA >89 03/26/2016 1600   Lab Results  Component Value Date   HGBA1C 4.9 08/03/2016   Lab Results  Component Value Date   INSULIN 5.9 08/03/2016   CBC    Component Value Date/Time   WBC 6.4 08/03/2016 1458   WBC 6.7 03/26/2016 1600   RBC 4.56 08/03/2016 1458   RBC 4.68 03/26/2016 1600   HGB 14.3 03/26/2016 1600   HCT 41.3 08/03/2016 1458   PLT 287 03/26/2016 1600   MCV 91 08/03/2016 1458   MCH 29.8 08/03/2016 1458   MCH 30.6 03/26/2016 1600   MCHC 32.9 08/03/2016 1458   MCHC 33.6 03/26/2016 1600   RDW 12.9 08/03/2016 1458   LYMPHSABS 1.8 08/03/2016 1458   MONOABS 469 03/26/2016 1600   EOSABS 0.2 08/03/2016 1458   BASOSABS 0.0 08/03/2016 1458   Iron/TIBC/Ferritin/ %Sat    Component Value Date/Time   FERRITIN 142 12/25/2015 0912   Lipid Panel     Component Value Date/Time   CHOL 236 (H) 08/03/2016 1458   TRIG 97 08/03/2016 1458   HDL 61 08/03/2016 1458   CHOLHDL 3.0 04/05/2015 [id number]  VLDL 21 04/05/2015 0937   LDLCALC 156 (H) 08/03/2016 1458   Hepatic Function Panel     Component Value Date/Time   PROT 7.0 08/03/2016 1458   ALBUMIN 4.6 08/03/2016 1458   AST 26 08/03/2016 1458   ALT 33 (H) 08/03/2016 1458   ALKPHOS 58 08/03/2016 1458   BILITOT 0.7 08/03/2016 1458   BILIDIR 0.1 12/25/2015 0912   IBILI 0.5 12/25/2015 0912      Component Value Date/Time   TSH 1.470 08/03/2016 1458   TSH 2.09 03/26/2016 1600   TSH 3.10 12/25/2015 0912    ASSESSMENT AND PLAN: Vitamin D  deficiency  Other depression  Class 1 obesity without serious comorbidity with body mass index (BMI) of 34.0 to 34.9 in adult, unspecified obesity type  PLAN:  Vitamin D Deficiency Kathy Clark was informed that low vitamin D levels contributes to fatigue and are associated with obesity, breast, and colon cancer. She agrees to continue to take prescription Vit D @50 ,000 IU every week and will follow up for routine testing of vitamin D, at least 2-3 times per year. She was informed of the risk of over-replacement of vitamin D and agrees to not increase her dose unless he discusses this with [place] first.  Depression with Emotional Eating Behaviors We discussed behavior modification techniques today to help Kathy Clark deal with her emotional eating and depression. She has agreed to continue to take Prozac and will work on cognitive behavioral therapy to decrease emotional eating and has agreed to follow up as directed.  We spent > than 50% of the 30 minute visit on the counseling as documented in the note.  Obesity Kathy Clark is currently in the action stage of change. As such, her goal is to continue with weight loss efforts She has agreed to keep a food journal with 500 calories and 30 grams of  Protein at lunch daily and follow the Category 3 plan Kathy Clark has been instructed to work up to a goal of 150 minutes of combined cardio and strengthening exercise per week for weight loss and overall health benefits. We discussed the following Behavioral Modification Stratagies today: increasing water, increasing lean protein intake, decreasing simple carbohydrates  and dealing with family or coworker sabotage  Kathy Clark has agreed to follow up with our clinic in 2 weeks. She was informed of the importance of frequent follow up visits to maximize her success with intensive lifestyle modifications for her multiple health conditions.  I, Doreene Nest, am acting as scribe for Dennard Nip, MD  I have reviewed the above  documentation for accuracy and completeness, and I agree with the above. -Dennard Nip, MD

## 2016-09-01 ENCOUNTER — Encounter: Payer: Self-pay | Admitting: Podiatry

## 2016-09-01 ENCOUNTER — Ambulatory Visit (INDEPENDENT_AMBULATORY_CARE_PROVIDER_SITE_OTHER): Payer: 59 | Admitting: Podiatry

## 2016-09-01 ENCOUNTER — Encounter (INDEPENDENT_AMBULATORY_CARE_PROVIDER_SITE_OTHER): Payer: Self-pay | Admitting: Family Medicine

## 2016-09-01 VITALS — BP 124/82 | HR 66 | Ht 68.0 in | Wt 224.0 lb

## 2016-09-01 DIAGNOSIS — M21619 Bunion of unspecified foot: Secondary | ICD-10-CM

## 2016-09-07 DIAGNOSIS — Z1231 Encounter for screening mammogram for malignant neoplasm of breast: Secondary | ICD-10-CM | POA: Diagnosis not present

## 2016-09-07 DIAGNOSIS — Z803 Family history of malignant neoplasm of breast: Secondary | ICD-10-CM | POA: Diagnosis not present

## 2016-09-07 NOTE — Patient Instructions (Signed)
Pre op consent form reviewed and signed.

## 2016-09-07 NOTE — Progress Notes (Signed)
Pre op for [employer] on left foot reviewed and consent form signed.

## 2016-09-09 ENCOUNTER — Encounter: Payer: Self-pay | Admitting: Physician Assistant

## 2016-09-15 ENCOUNTER — Encounter: Payer: Self-pay | Admitting: Physician Assistant

## 2016-09-15 ENCOUNTER — Other Ambulatory Visit (INDEPENDENT_AMBULATORY_CARE_PROVIDER_SITE_OTHER): Payer: Self-pay | Admitting: Family Medicine

## 2016-09-15 DIAGNOSIS — E559 Vitamin D deficiency, unspecified: Secondary | ICD-10-CM

## 2016-09-15 MED FILL — LOSARTAN-HCTZ 50-12.5 MG TA: 50-12.5 | 90 days supply | Qty: 90 | Fill #2

## 2016-09-15 MED FILL — ESTRADIOL 0.075 MG PATCH: 0.075 | 84 days supply | Qty: 24 | Fill #2

## 2016-09-16 ENCOUNTER — Ambulatory Visit (INDEPENDENT_AMBULATORY_CARE_PROVIDER_SITE_OTHER): Payer: 59 | Admitting: Family Medicine

## 2016-09-16 VITALS — BP 135/83 | HR 70 | Temp 97.8°F | Resp 16 | Ht 68.0 in | Wt 221.0 lb

## 2016-09-16 DIAGNOSIS — E669 Obesity, unspecified: Secondary | ICD-10-CM

## 2016-09-16 DIAGNOSIS — E559 Vitamin D deficiency, unspecified: Secondary | ICD-10-CM | POA: Diagnosis not present

## 2016-09-16 DIAGNOSIS — Z6833 Body mass index (BMI) 33.0-33.9, adult: Secondary | ICD-10-CM | POA: Diagnosis not present

## 2016-09-16 MED ORDER — VITAMIN D (ERGOCALCIFEROL) 1.25 MG (50000 UNIT) PO CAPS: 50000.0000 [IU] | ORAL_CAPSULE | ORAL | 0 refills | Status: DC

## 2016-09-16 MED FILL — VIT D2 1.25 MG (50,000 UNIT: 1.25 MG | 28 days supply | Qty: 4 | Fill #0

## 2016-09-16 NOTE — Progress Notes (Signed)
Office: [phone]  /  Fax: [phone]   HPI:   Chief Complaint: OBESITY Kathy Clark is here to discuss her progress with her obesity treatment plan. She is following her eating plan approximately 100 % of the time and states she is exercising 10 minutes 5 times per week. Kathy Clark continues to do well with weight loss and some lunch well. She notes hunger is controlled. She is doing better with planning food ahead of time. Her weight is 221 lb (100.2 kg) today and has had a weight loss of 3 pounds over a period of 1 to 2 weeks since her last visit. She has lost 16 lbs since starting treatment with [place].  Vitamin D deficiency Kathy Clark has a diagnosis of vitamin D deficiency. She is currently taking prescription vit D and is not yet at goal but fatigue has improved. She denies nausea, vomiting or muscle weakness.  Wt Readings from Last 500 Encounters:  09/16/16 221 lb (100.2 kg)  09/01/16 224 lb (101.6 kg)  08/31/16 224 lb (101.6 kg)  08/17/16 238 lb 6.4 oz (108.1 kg)  08/17/16 229 lb (103.9 kg)  08/03/16 237 lb (107.5 kg)  07/10/16 241 lb (109.3 kg)  07/08/16 241 lb 4 oz (109.4 kg)  05/19/16 232 lb (105.2 kg)  04/06/16 236 lb 3.2 oz (107.1 kg)  03/26/16 237 lb (107.5 kg)  02/20/16 235 lb (106.6 kg)  01/21/16 232 lb (105.2 kg)  12/25/15 230 lb (104.3 kg)  12/18/15 228 lb (103.4 kg)  11/06/15 223 lb (101.2 kg)  10/23/15 223 lb 12.8 oz (101.5 kg)  10/15/15 220 lb (99.8 kg)  08/23/15 220 lb 8 oz (100 kg)  07/26/15 217 lb (98.4 kg)  07/11/15 222 lb (100.7 kg)  06/13/15 217 lb (98.4 kg)  04/23/15 220 lb (99.8 kg)  03/12/15 217 lb (98.4 kg)  01/22/15 216 lb (98 kg)  12/24/14 224 lb (101.6 kg)  09/28/14 222 lb (100.7 kg)  08/07/14 226 lb (102.5 kg)  06/21/14 235 lb (106.6 kg)  03/14/14 226 lb (102.5 kg)  12/14/13 221 lb (100.2 kg)  08/14/13 217 lb (98.4 kg)  06/19/13 210 lb (95.3 kg)  05/19/13 210 lb (95.3 kg)  04/21/13 219 lb (99.3 kg)  03/24/13 214 lb (97.1 kg)  03/10/13 213 lb  (96.6 kg)  02/24/13 215 lb (97.5 kg)  01/26/13 219 lb (99.3 kg)  04/19/12 211 lb (95.7 kg)  01/01/12 208 lb (94.3 kg)  11/03/11 214 lb (97.1 kg)  10/05/11 218 lb (98.9 kg)  07/27/11 210 lb (95.3 kg)  07/20/11 218 lb (98.9 kg)  07/17/11 217 lb (98.4 kg)  07/15/11 216 lb (98 kg)  03/04/11 210 lb (95.3 kg)  01/13/11 210 lb (95.3 kg)  02/03/10 218 lb (98.9 kg)  12/12/09 (!) 225 lb (102.1 kg)  10/22/09 (!) 226 lb (102.5 kg)  12/05/08 221 lb 4 oz (100.4 kg)  11/05/08 220 lb (99.8 kg)  05/31/08 212 lb (96.2 kg)  01/09/08 213 lb 4 oz (96.7 kg)  11/10/07 216 lb (98 kg)  05/20/07 (!) 229 lb (103.9 kg)  02/01/07 (!) 225 lb (102.1 kg)  12/31/06 (!) 225 lb (102.1 kg)  12/21/06 (!) 225 lb (102.1 kg)  11/23/06 (!) 231 lb (104.8 kg)  05/19/06 (!) 224 lb (101.6 kg)     ALLERGIES: Allergies  Allergen Reactions  . Succinylcholine Anaphylaxis  . Dilaudid [Hydromorphone Hcl] Nausea And Vomiting    MEDICATIONS: Current Outpatient Prescriptions on File Prior to Visit  Medication Sig Dispense Refill  .  estradiol (VIVELLE-DOT) 0.1 MG/24HR patch Place 1 patch onto the skin 2 (two) times a week.    [name] FLUoxetine (PROZAC) 20 MG tablet Take 1.5 tablets (30 mg total) by mouth daily. 135 tablet 1  . gabapentin (NEURONTIN) 300 MG capsule Take 300 mg by mouth 2 (two) times daily.     [name] losartan-hydrochlorothiazide (HYZAAR) 50-12.5 MG tablet daily at 2 PM.  3  . Melatonin 3 MG TABS Take by mouth as directed.    . Multiple Vitamins-Minerals (MULTIVITAMIN ADULT PO) Take by mouth daily.    . mupirocin ointment (BACTROBAN) 2 % Place 1 application into the nose 2 (two) times daily. (Patient not taking: Reported on 09/16/2016) 22 g 0  . omeprazole (PRILOSEC) 40 MG capsule Take 1 capsule (40 mg total) by mouth daily. (Patient not taking: Reported on 09/16/2016) 30 capsule 2  . ranitidine (ZANTAC) 300 MG tablet Take 1 tablet (300 mg total) by mouth 2 (two) times daily. 180 tablet 2  . SUMAtriptan (IMITREX) 20  MG/ACT nasal spray PLACE 1 SPRAY INTO THE NOSE EVERY 2 HOURS AS NEEDED FOR. HEADACHE 6 Inhaler 2  . Vitamin D, Ergocalciferol, (DRISDOL) 50000 units CAPS capsule Take 1 capsule (50,000 Units total) by mouth every 7 (seven) days. 4 capsule 0  . [DISCONTINUED] rivaroxaban (XARELTO) 10 MG TABS tablet Take 1 tablet (10 mg total) by mouth daily with breakfast. 18 tablet 0   No current facility-administered medications on file prior to visit.     PAST MEDICAL HISTORY: Past Medical History:  Diagnosis Date  . Anxiety   . Back pain   . 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 Depression   . Essential hypertension, benign   . Gastritis   . GERD (gastroesophageal reflux disease)   . Hypertension   . Joint pain   . Lymphocytic colitis    microscopic  . Migraines   . Osteoarthritis   . Sciatica    right leg  . Swelling    feet, left foot    PAST SURGICAL HISTORY: Past Surgical History:  Procedure Laterality Date  . ABDOMINAL HYSTERECTOMY  07-2005  . BACK SURGERY    . KNEE SURGERY  2006   left; multiple  . microdiscetomy  2005  . SHOULDER SURGERY  07-22-09   right  . SKIN CANCER EXCISION     eyelid  . TOTAL KNEE ARTHROPLASTY  07/27/2011   Procedure: TOTAL KNEE ARTHROPLASTY;  Surgeon: Gearlean Alf;  Location: [facility];  Service: Orthopedics;  Laterality: Left;    SOCIAL HISTORY: Social History  Substance Use Topics  . Smoking status: Never Smoker  . Smokeless tobacco: Never Used  . Alcohol use Yes     Comment: occasionally    FAMILY HISTORY: Family History  Problem Relation Age of Onset  . Breast cancer    . Tongue cancer Mother   . Breast cancer Mother   . Anxiety disorder Mother   . Depression Mother   . Heart disease Father   . Hypertension Father   . Hyperlipidemia Father   . Stroke Paternal Grandmother   . Colon cancer Neg Hx     ROS: Review of Systems  Constitutional: Positive for malaise/fatigue.  Gastrointestinal: Negative for  nausea and vomiting.  Musculoskeletal:       Negative muscle weakness    PHYSICAL EXAM: Blood pressure 135/83, pulse 70, temperature 97.8 F (36.6 C), temperature source Oral, resp. rate 16, height 5\' 8"  (1.727 m), weight 221 lb (  100.2 kg), SpO2 98 %. Body mass index is 33.6 kg/m. Physical Exam  Constitutional: She is oriented to person, place, and time. She appears well-developed and well-nourished.  Cardiovascular: Normal rate.   Pulmonary/Chest: Effort normal.  Musculoskeletal: Normal range of motion.  Neurological: She is oriented to person, place, and time.  Skin: Skin is warm and dry.  Psychiatric: She has a normal mood and affect. Her behavior is normal.  Vitals reviewed.   RECENT LABS AND TESTS: BMET    Component Value Date/Time   NA 139 08/03/2016 1458   K 3.7 08/03/2016 1458   CL 98 08/03/2016 1458   CO2 26 08/03/2016 1458   GLUCOSE 75 08/03/2016 1458   GLUCOSE 79 03/26/2016 1600   BUN 12 08/03/2016 1458   CREATININE 0.61 08/03/2016 1458   CREATININE 0.61 03/26/2016 1600   CALCIUM 9.1 08/03/2016 1458   GFRNONAA 102 08/03/2016 1458   GFRNONAA >89 03/26/2016 1600   GFRAA 117 08/03/2016 1458   GFRAA >89 03/26/2016 1600   Lab Results  Component Value Date   HGBA1C 4.9 08/03/2016   Lab Results  Component Value Date   INSULIN 5.9 08/03/2016   CBC    Component Value Date/Time   WBC 6.4 08/03/2016 1458   WBC 6.7 03/26/2016 1600   RBC 4.56 08/03/2016 1458   RBC 4.68 03/26/2016 1600   HGB 14.3 03/26/2016 1600   HCT 41.3 08/03/2016 1458   PLT 287 03/26/2016 1600   MCV 91 08/03/2016 1458   MCH 29.8 08/03/2016 1458   MCH 30.6 03/26/2016 1600   MCHC 32.9 08/03/2016 1458   MCHC 33.6 03/26/2016 1600   RDW 12.9 08/03/2016 1458   LYMPHSABS 1.8 08/03/2016 1458   MONOABS 469 03/26/2016 1600   EOSABS 0.2 08/03/2016 1458   BASOSABS 0.0 08/03/2016 1458   Iron/TIBC/Ferritin/ %Sat    Component Value Date/Time   FERRITIN 142 12/25/2015 0912   Lipid Panel       Component Value Date/Time   CHOL 236 (H) 08/03/2016 1458   TRIG 97 08/03/2016 1458   HDL 61 08/03/2016 1458   CHOLHDL 3.0 04/05/2015 0937   VLDL 21 04/05/2015 0937   LDLCALC 156 (H) 08/03/2016 1458   Hepatic Function Panel     Component Value Date/Time   PROT 7.0 08/03/2016 1458   ALBUMIN 4.6 08/03/2016 1458   AST 26 08/03/2016 1458   ALT 33 (H) 08/03/2016 1458   ALKPHOS 58 08/03/2016 1458   BILITOT 0.7 08/03/2016 1458   BILIDIR 0.1 12/25/2015 0912   IBILI 0.5 12/25/2015 0912      Component Value Date/Time   TSH 1.470 08/03/2016 1458   TSH 2.09 03/26/2016 1600   TSH 3.10 12/25/2015 0912    ASSESSMENT AND PLAN: Vitamin D deficiency - Plan: Vitamin D, Ergocalciferol, (DRISDOL) 50000 units CAPS capsule  Class 1 obesity without serious comorbidity with body mass index (BMI) of 33.0 to 33.9 in adult, unspecified obesity type  PLAN:  Vitamin D Deficiency Kathy Clark was informed that low vitamin D levels contributes to fatigue and are associated with obesity, breast, and colon cancer. She agrees to continue to take prescription Vit D @50 ,000 IU every week, will refill for 1 month and will re-check labs in 1 month and  follow up for routine testing of vitamin D, at least 2-3 times per year. She was informed of the risk of over-replacement of vitamin D and agrees to not increase her dose unless he discusses this with [place] first.  Obesity Kathy Clark is  currently in the action stage of change. As such, her goal is to continue with weight loss efforts She has agreed to keep a food journal with 450 calories and 25 grams of protein at lunch daily and follow the Category 2 plan Kathy Clark has been instructed to work up to a goal of 150 minutes of combined cardio and strengthening exercise per week or exercise bands 10 minutes per day 5 times per week for weight loss and overall health benefits. We discussed the following Behavioral Modification Stratagies today: increasing lean protein intake, decreasing  simple carbohydrates , increasing lower sugar fruits and increasing fiber rich foods  Kathy Clark has agreed to follow up with our clinic in 2 weeks. She was informed of the importance of frequent follow up visits to maximize her success with intensive lifestyle modifications for her multiple health conditions.  I, Doreene Nest, am acting as scribe for Dennard Nip, MD  I have reviewed the above documentation for accuracy and completeness, and I agree with the above. -Dennard Nip, MD

## 2016-09-17 ENCOUNTER — Other Ambulatory Visit: Payer: Self-pay | Admitting: Podiatry

## 2016-09-17 MED ORDER — PROMETHAZINE HCL 12.5 MG PO TABS: 12.5000 mg | ORAL_TABLET | Freq: Three times a day (TID) | ORAL | 0 refills | Status: DC | PRN

## 2016-09-17 MED ORDER — MEPERIDINE HCL 50 MG PO TABS: 50.0000 mg | ORAL_TABLET | Freq: Three times a day (TID) | ORAL | 0 refills | Status: DC | PRN

## 2016-09-17 MED ORDER — NABUMETONE 500 MG PO TABS: 500.0000 mg | ORAL_TABLET | Freq: Two times a day (BID) | ORAL | 1 refills | Status: DC

## 2016-09-17 MED FILL — NABUMETONE 500 MG TABLET: 500 | 30 days supply | Qty: 60 | Fill #0

## 2016-09-17 MED FILL — PROMETHAZINE 12.5 MG TABLET: 12.5 | 7 days supply | Qty: 21 | Fill #0

## 2016-09-18 DIAGNOSIS — M2012 Hallux valgus (acquired), left foot: Secondary | ICD-10-CM | POA: Diagnosis not present

## 2016-09-18 DIAGNOSIS — M2010 Hallux valgus (acquired), unspecified foot: Secondary | ICD-10-CM | POA: Diagnosis not present

## 2016-09-18 HISTORY — PX: OTHER SURGICAL HISTORY: SHX169

## 2016-09-22 ENCOUNTER — Encounter: Payer: Self-pay | Admitting: *Deleted

## 2016-09-24 ENCOUNTER — Encounter: Payer: Self-pay | Admitting: Podiatry

## 2016-09-24 ENCOUNTER — Ambulatory Visit (INDEPENDENT_AMBULATORY_CARE_PROVIDER_SITE_OTHER): Payer: 59 | Admitting: Podiatry

## 2016-09-24 ENCOUNTER — Encounter: Payer: 59 | Admitting: Podiatry

## 2016-09-24 DIAGNOSIS — M21619 Bunion of unspecified foot: Secondary | ICD-10-CM | POA: Diagnosis not present

## 2016-09-24 DIAGNOSIS — M79605 Pain in left leg: Secondary | ICD-10-CM

## 2016-09-24 DIAGNOSIS — M79672 Pain in left foot: Secondary | ICD-10-CM | POA: Diagnosis not present

## 2016-09-24 NOTE — Progress Notes (Signed)
1 week post op following [place] bunionectomy left foot, 09/18/16. Patient denies any discomfort at this time. Wound is clean and dry with no edema or erythema at surgical site. Dressing changed with Iodine gauze. Post op X-ray show intact hardware, 2 screws with good bone to bone approximation at osteotomy site and reduced intermetatarsal angle and hallux valgus deformity. Ok to increase activity as tolerated. Return in one week.

## 2016-09-24 NOTE — Patient Instructions (Signed)
Normal post op wound healing following left foot bunionectomy. Ok to get around but without propulsive gait. Keep dressing clean and dry. Return in one week.

## 2016-09-28 MED FILL — OMEPRAZOLE DR 40 MG CAPSULE: 40 | 30 days supply | Qty: 30 | Fill #1

## 2016-09-29 ENCOUNTER — Ambulatory Visit (INDEPENDENT_AMBULATORY_CARE_PROVIDER_SITE_OTHER): Payer: 59 | Admitting: Family Medicine

## 2016-09-29 VITALS — BP 133/85 | HR 67 | Temp 98.0°F | Ht 68.0 in | Wt 219.0 lb

## 2016-09-29 DIAGNOSIS — E785 Hyperlipidemia, unspecified: Secondary | ICD-10-CM

## 2016-09-29 DIAGNOSIS — Z6833 Body mass index (BMI) 33.0-33.9, adult: Secondary | ICD-10-CM | POA: Diagnosis not present

## 2016-09-29 DIAGNOSIS — E559 Vitamin D deficiency, unspecified: Secondary | ICD-10-CM

## 2016-09-29 DIAGNOSIS — E669 Obesity, unspecified: Secondary | ICD-10-CM | POA: Diagnosis not present

## 2016-09-30 NOTE — Progress Notes (Signed)
Office: [phone]  /  Fax: [phone]   HPI:   Chief Complaint: OBESITY Kathy Clark is here to discuss her progress with her obesity treatment plan. She is following her eating plan approximately 75 % of the time and states she is exercising 0 minutes 0 times per week. Kathy Clark has done well with weight loss even after bunion surgery. She did well with portion control/smart choices after surgery. She did well planning meals ahead of time. Her weight is 219 lb (99.3 kg) today and has had a weight loss of 3 pounds over a period of 2 weeks since her last visit. She has lost 18 lbs since starting treatment with [place].  Vitamin D deficiency Kathy Clark has a diagnosis of vitamin D deficiency. She is currently taking prescription vit D and is tolerating well. She denies nausea, vomiting or muscle weakness.  Hyperlipidemia Kathy Clark has hyperlipidemia and has been attempting to improve her cholesterol levels with intensive lifestyle modification including a low saturated fat diet, exercise and weight loss and is doing well. She denies any chest pain, claudication or myalgias.  Wt Readings from Last 500 Encounters:  09/29/16 219 lb (99.3 kg)  09/16/16 221 lb (100.2 kg)  09/01/16 224 lb (101.6 kg)  08/31/16 224 lb (101.6 kg)  08/17/16 238 lb 6.4 oz (108.1 kg)  08/17/16 229 lb (103.9 kg)  08/03/16 237 lb (107.5 kg)  07/10/16 241 lb (109.3 kg)  07/08/16 241 lb 4 oz (109.4 kg)  05/19/16 232 lb (105.2 kg)  04/06/16 236 lb 3.2 oz (107.1 kg)  03/26/16 237 lb (107.5 kg)  02/20/16 235 lb (106.6 kg)  01/21/16 232 lb (105.2 kg)  12/25/15 230 lb (104.3 kg)  12/18/15 228 lb (103.4 kg)  11/06/15 223 lb (101.2 kg)  10/23/15 223 lb 12.8 oz (101.5 kg)  10/15/15 220 lb (99.8 kg)  08/23/15 220 lb 8 oz (100 kg)  07/26/15 217 lb (98.4 kg)  07/11/15 222 lb (100.7 kg)  06/13/15 217 lb (98.4 kg)  04/23/15 220 lb (99.8 kg)  03/12/15 217 lb (98.4 kg)  01/22/15 216 lb (98 kg)  12/24/14 224 lb (101.6 kg)  09/28/14 222 lb  (100.7 kg)  08/07/14 226 lb (102.5 kg)  06/21/14 235 lb (106.6 kg)  03/14/14 226 lb (102.5 kg)  12/14/13 221 lb (100.2 kg)  08/14/13 217 lb (98.4 kg)  06/19/13 210 lb (95.3 kg)  05/19/13 210 lb (95.3 kg)  04/21/13 219 lb (99.3 kg)  03/24/13 214 lb (97.1 kg)  03/10/13 213 lb (96.6 kg)  02/24/13 215 lb (97.5 kg)  01/26/13 219 lb (99.3 kg)  04/19/12 211 lb (95.7 kg)  01/01/12 208 lb (94.3 kg)  11/03/11 214 lb (97.1 kg)  10/05/11 218 lb (98.9 kg)  07/27/11 210 lb (95.3 kg)  07/20/11 218 lb (98.9 kg)  07/17/11 217 lb (98.4 kg)  07/15/11 216 lb (98 kg)  03/04/11 210 lb (95.3 kg)  01/13/11 210 lb (95.3 kg)  02/03/10 218 lb (98.9 kg)  12/12/09 (!) 225 lb (102.1 kg)  10/22/09 (!) 226 lb (102.5 kg)  12/05/08 221 lb 4 oz (100.4 kg)  11/05/08 220 lb (99.8 kg)  05/31/08 212 lb (96.2 kg)  01/09/08 213 lb 4 oz (96.7 kg)  11/10/07 216 lb (98 kg)  05/20/07 (!) 229 lb (103.9 kg)  02/01/07 (!) 225 lb (102.1 kg)  12/31/06 (!) 225 lb (102.1 kg)  12/21/06 (!) 225 lb (102.1 kg)  11/23/06 (!) 231 lb (104.8 kg)  05/19/06 (!) 224 lb (101.6 kg)  ALLERGIES: Allergies  Allergen Reactions   Succinylcholine Anaphylaxis   Dilaudid [Hydromorphone Hcl] Nausea And Vomiting    MEDICATIONS: Current Outpatient Prescriptions on File Prior to Visit  Medication Sig Dispense Refill   estradiol (VIVELLE-DOT) 0.1 MG/24HR patch Place 1 patch onto the skin 2 (two) times a week.     FLUoxetine (PROZAC) 20 MG tablet Take 1.5 tablets (30 mg total) by mouth daily. 135 tablet 1   gabapentin (NEURONTIN) 300 MG capsule Take 300 mg by mouth 2 (two) times daily.      losartan-hydrochlorothiazide (HYZAAR) 50-12.5 MG tablet daily at 2 PM.  3   Melatonin 3 MG TABS Take by mouth as directed.     meperidine (DEMEROL) 50 MG tablet Take 1 tablet (50 mg total) by mouth 3 (three) times daily as needed for severe pain. 21 tablet 0   Multiple Vitamins-Minerals (MULTIVITAMIN ADULT PO) Take by mouth daily.      mupirocin ointment (BACTROBAN) 2 % Place 1 application into the nose 2 (two) times daily. 22 g 0   nabumetone (RELAFEN) 500 MG tablet Take 1 tablet (500 mg total) by mouth 2 (two) times daily. 60 tablet 1   omeprazole (PRILOSEC) 40 MG capsule Take 1 capsule (40 mg total) by mouth daily. 30 capsule 2   promethazine (PHENERGAN) 12.5 MG tablet Take 1 tablet (12.5 mg total) by mouth every 8 (eight) hours as needed for nausea or vomiting. 21 tablet 0   ranitidine (ZANTAC) 300 MG tablet Take 1 tablet (300 mg total) by mouth 2 (two) times daily. 180 tablet 2   SUMAtriptan (IMITREX) 20 MG/ACT nasal spray PLACE 1 SPRAY INTO THE NOSE EVERY 2 HOURS AS NEEDED FOR. HEADACHE 6 Inhaler 2   Vitamin D, Ergocalciferol, (DRISDOL) 50000 units CAPS capsule Take 1 capsule (50,000 Units total) by mouth every 7 (seven) days. 4 capsule 0   [DISCONTINUED] rivaroxaban (XARELTO) 10 MG TABS tablet Take 1 tablet (10 mg total) by mouth daily with breakfast. 18 tablet 0   No current facility-administered medications on file prior to visit.     PAST MEDICAL HISTORY: Past Medical History:  Diagnosis Date   Anxiety    Back pain    Complication of anesthesia    allergy to Succinylcholine   Depression    Essential hypertension, benign    Gastritis    GERD (gastroesophageal reflux disease)    Hypertension    Joint pain    Lymphocytic colitis    microscopic   Migraines    Osteoarthritis    Sciatica    right leg   Swelling    feet, left foot    PAST SURGICAL HISTORY: Past Surgical History:  Procedure Laterality Date   ABDOMINAL HYSTERECTOMY  07-2005   [place] Bunionectomy Left 09/18/2016   Left Foot   BACK SURGERY     KNEE SURGERY  2006   left; multiple   microdiscetomy  2005   SHOULDER SURGERY  07-22-09   right   SKIN CANCER EXCISION     eyelid   TOTAL KNEE ARTHROPLASTY  07/27/2011   Procedure: TOTAL KNEE ARTHROPLASTY;  Surgeon: Gearlean Alf;  Location: [facility];  Service:  Orthopedics;  Laterality: Left;    SOCIAL HISTORY: Social History  Substance Use Topics   Smoking status: Never Smoker   Smokeless tobacco: Never Used   Alcohol use Yes     Comment: occasionally    FAMILY HISTORY: Family History  Problem Relation Age of Onset   Breast cancer  Tongue cancer Mother    Breast cancer Mother    Anxiety disorder Mother    Depression Mother    Heart disease Father    Hypertension Father    Hyperlipidemia Father    Stroke Paternal Grandmother    Colon cancer Neg Hx     ROS: Review of Systems  Constitutional: Positive for weight loss.  Cardiovascular: Negative for chest pain and claudication.  Gastrointestinal: Negative for nausea and vomiting.  Musculoskeletal: Negative for myalgias.       Negative muscle weakness    PHYSICAL EXAM: Blood pressure 133/85, pulse 67, temperature 98 F (36.7 C), temperature source Oral, height 5\' 8"  (1.727 m), weight 219 lb (99.3 kg), SpO2 98 %. Body mass index is 33.3 kg/m. Physical Exam  Constitutional: She is oriented to person, place, and time. She appears well-developed and well-nourished.  Cardiovascular: Normal rate.   Pulmonary/Chest: Effort normal.  Musculoskeletal: Normal range of motion.  Neurological: She is oriented to person, place, and time.  Skin: Skin is warm and dry.  Psychiatric: She has a normal mood and affect. Her behavior is normal.  Vitals reviewed.   RECENT LABS AND TESTS: BMET    Component Value Date/Time   NA 139 08/03/2016 1458   K 3.7 08/03/2016 1458   CL 98 08/03/2016 1458   CO2 26 08/03/2016 1458   GLUCOSE 75 08/03/2016 1458   GLUCOSE 79 03/26/2016 1600   BUN 12 08/03/2016 1458   CREATININE 0.61 08/03/2016 1458   CREATININE 0.61 03/26/2016 1600   CALCIUM 9.1 08/03/2016 1458   GFRNONAA 102 08/03/2016 1458   GFRNONAA >89 03/26/2016 1600   GFRAA 117 08/03/2016 1458   GFRAA >89 03/26/2016 1600   Lab Results  Component Value Date   HGBA1C 4.9  08/03/2016   Lab Results  Component Value Date   INSULIN 5.9 08/03/2016   CBC    Component Value Date/Time   WBC 6.4 08/03/2016 1458   WBC 6.7 03/26/2016 1600   RBC 4.56 08/03/2016 1458   RBC 4.68 03/26/2016 1600   HGB 14.3 03/26/2016 1600   HCT 41.3 08/03/2016 1458   PLT 287 03/26/2016 1600   MCV 91 08/03/2016 1458   MCH 29.8 08/03/2016 1458   MCH 30.6 03/26/2016 1600   MCHC 32.9 08/03/2016 1458   MCHC 33.6 03/26/2016 1600   RDW 12.9 08/03/2016 1458   LYMPHSABS 1.8 08/03/2016 1458   MONOABS 469 03/26/2016 1600   EOSABS 0.2 08/03/2016 1458   BASOSABS 0.0 08/03/2016 1458   Iron/TIBC/Ferritin/ %Sat    Component Value Date/Time   FERRITIN 142 12/25/2015 0912   Lipid Panel     Component Value Date/Time   CHOL 236 (H) 08/03/2016 1458   TRIG 97 08/03/2016 1458   HDL 61 08/03/2016 1458   CHOLHDL 3.0 04/05/2015 0937   VLDL 21 04/05/2015 0937   LDLCALC 156 (H) 08/03/2016 1458   Hepatic Function Panel     Component Value Date/Time   PROT 7.0 08/03/2016 1458   ALBUMIN 4.6 08/03/2016 1458   AST 26 08/03/2016 1458   ALT 33 (H) 08/03/2016 1458   ALKPHOS 58 08/03/2016 1458   BILITOT 0.7 08/03/2016 1458   BILIDIR 0.1 12/25/2015 0912   IBILI 0.5 12/25/2015 0912      Component Value Date/Time   TSH 1.470 08/03/2016 1458   TSH 2.09 03/26/2016 1600   TSH 3.10 12/25/2015 0912    ASSESSMENT AND PLAN: Vitamin D deficiency  Hyperlipidemia, unspecified hyperlipidemia type  Class 1 obesity without  serious comorbidity with body mass index (BMI) of 33.0 to 33.9 in adult, unspecified obesity type  PLAN:  Vitamin D Deficiency Kathy Clark was informed that low vitamin D levels contributes to fatigue and are associated with obesity, breast, and colon cancer. She agrees to continue to take prescription Vit D @50 ,000 IU every week and we will re-check labs in 1 month and will follow up for routine testing of vitamin D, at least 2-3 times per year. She was informed of the risk of  over-replacement of vitamin D and agrees to not increase her dose unless he discusses this with [place] first.  Hyperlipidemia Kathy Clark was informed of the American Heart Association Guidelines emphasizing intensive lifestyle modifications as the first line treatment for hyperlipidemia. We discussed many lifestyle modifications today in depth, and Kathy Clark will continue to work on decreasing saturated fats such as fatty red meat, butter and many fried foods. She will also increase vegetables and lean protein in her diet and continue to work on exercise and weight loss efforts. We will re-check labs in 1 month and Kathy Clark will follow up with our clinic in 2 weeks.  We spent > than 50% of the 30 minute visit on the counseling as documented in the note.  Obesity Kathy Clark is currently in the action stage of change. As such, her goal is to continue with weight loss efforts She has agreed to keep a food journal with 1100 to 1400 calories and 75+ grams of protein daily Kathy Clark has been instructed to work up to a goal of 150 minutes of combined cardio and strengthening exercise per week for weight loss and overall health benefits. We discussed the following Behavioral Modification Stratagies today: increasing lean protein intake, decreasing simple carbohydrates  and increasing lower sugar fruits  Kathy Clark has agreed to follow up with our clinic in 2 weeks. She was informed of the importance of frequent follow up visits to maximize her success with intensive lifestyle modifications for her multiple health conditions.  I, Doreene Nest, am acting as scribe for Dennard Nip, MD  I have reviewed the above documentation for accuracy and completeness, and I agree with the above. -Dennard Nip, MD

## 2016-10-01 ENCOUNTER — Ambulatory Visit (INDEPENDENT_AMBULATORY_CARE_PROVIDER_SITE_OTHER): Payer: 59 | Admitting: Podiatry

## 2016-10-01 ENCOUNTER — Encounter: Payer: Self-pay | Admitting: Podiatry

## 2016-10-01 DIAGNOSIS — Z9889 Other specified postprocedural states: Secondary | ICD-10-CM

## 2016-10-01 NOTE — Progress Notes (Signed)
2 week post op following [place] bunionectomy left foot, 09/18/16. Wound healing normal without complication.  Suture removed.  Continue with surgical shoe and compression stockinet during the day.  Return in 2 weeks.

## 2016-10-01 NOTE — Patient Instructions (Signed)
2 weeks post op wound healing normal without complication. Suture removed. Continue with surgical shoe and compression stockinet during the day. Return in 2 weeks.

## 2016-10-13 ENCOUNTER — Ambulatory Visit (INDEPENDENT_AMBULATORY_CARE_PROVIDER_SITE_OTHER): Payer: 59 | Admitting: Family Medicine

## 2016-10-13 VITALS — BP 117/72 | HR 67 | Temp 98.5°F | Ht 68.0 in | Wt 219.0 lb

## 2016-10-13 DIAGNOSIS — E669 Obesity, unspecified: Secondary | ICD-10-CM | POA: Diagnosis not present

## 2016-10-13 DIAGNOSIS — E559 Vitamin D deficiency, unspecified: Secondary | ICD-10-CM | POA: Diagnosis not present

## 2016-10-13 DIAGNOSIS — Z6833 Body mass index (BMI) 33.0-33.9, adult: Secondary | ICD-10-CM | POA: Diagnosis not present

## 2016-10-13 MED ORDER — VITAMIN D (ERGOCALCIFEROL) 1.25 MG (50000 UNIT) PO CAPS: 50000.0000 [IU] | ORAL_CAPSULE | ORAL | 0 refills | Status: DC

## 2016-10-13 NOTE — Progress Notes (Signed)
Office: [phone]  /  Fax: [phone]   HPI:   Chief Complaint: OBESITY Kathy Clark is here to discuss her progress with her obesity treatment plan. She is following her eating plan approximately 95 % of the time and states she is exercising 0 minutes 0 times per week. Kathy Clark has done well maintaining weight. She is retaining some fluid today with increased sugar over Easter. She is doing well journaling. Her weight is 219 lb (99.3 kg) today and has maintained weight over a period of 2 weeks since her last visit. She has lost 18 lbs since starting treatment with [place].  Vitamin D deficiency Kathy Clark has a diagnosis of vitamin D deficiency. She is currently stable on vit D and denies nausea, vomiting or muscle weakness.  Wt Readings from Last 500 Encounters:  10/13/16 219 lb (99.3 kg)  09/29/16 219 lb (99.3 kg)  09/16/16 221 lb (100.2 kg)  09/01/16 224 lb (101.6 kg)  08/31/16 224 lb (101.6 kg)  08/17/16 238 lb 6.4 oz (108.1 kg)  08/17/16 229 lb (103.9 kg)  08/03/16 237 lb (107.5 kg)  07/10/16 241 lb (109.3 kg)  07/08/16 241 lb 4 oz (109.4 kg)  05/19/16 232 lb (105.2 kg)  04/06/16 236 lb 3.2 oz (107.1 kg)  03/26/16 237 lb (107.5 kg)  02/20/16 235 lb (106.6 kg)  01/21/16 232 lb (105.2 kg)  12/25/15 230 lb (104.3 kg)  12/18/15 228 lb (103.4 kg)  11/06/15 223 lb (101.2 kg)  10/23/15 223 lb 12.8 oz (101.5 kg)  10/15/15 220 lb (99.8 kg)  08/23/15 220 lb 8 oz (100 kg)  07/26/15 217 lb (98.4 kg)  07/11/15 222 lb (100.7 kg)  06/13/15 217 lb (98.4 kg)  04/23/15 220 lb (99.8 kg)  03/12/15 217 lb (98.4 kg)  01/22/15 216 lb (98 kg)  12/24/14 224 lb (101.6 kg)  09/28/14 222 lb (100.7 kg)  08/07/14 226 lb (102.5 kg)  06/21/14 235 lb (106.6 kg)  03/14/14 226 lb (102.5 kg)  12/14/13 221 lb (100.2 kg)  08/14/13 217 lb (98.4 kg)  06/19/13 210 lb (95.3 kg)  05/19/13 210 lb (95.3 kg)  04/21/13 219 lb (99.3 kg)  03/24/13 214 lb (97.1 kg)  03/10/13 213 lb (96.6 kg)  02/24/13 215 lb (97.5 kg)   01/26/13 219 lb (99.3 kg)  04/19/12 211 lb (95.7 kg)  01/01/12 208 lb (94.3 kg)  11/03/11 214 lb (97.1 kg)  10/05/11 218 lb (98.9 kg)  07/27/11 210 lb (95.3 kg)  07/20/11 218 lb (98.9 kg)  07/17/11 217 lb (98.4 kg)  07/15/11 216 lb (98 kg)  03/04/11 210 lb (95.3 kg)  01/13/11 210 lb (95.3 kg)  02/03/10 218 lb (98.9 kg)  12/12/09 (!) 225 lb (102.1 kg)  10/22/09 (!) 226 lb (102.5 kg)  12/05/08 221 lb 4 oz (100.4 kg)  11/05/08 220 lb (99.8 kg)  05/31/08 212 lb (96.2 kg)  01/09/08 213 lb 4 oz (96.7 kg)  11/10/07 216 lb (98 kg)  05/20/07 (!) 229 lb (103.9 kg)  02/01/07 (!) 225 lb (102.1 kg)  12/31/06 (!) 225 lb (102.1 kg)  12/21/06 (!) 225 lb (102.1 kg)  11/23/06 (!) 231 lb (104.8 kg)  05/19/06 (!) 224 lb (101.6 kg)     ALLERGIES: Allergies  Allergen Reactions  . Succinylcholine Anaphylaxis  . Dilaudid [Hydromorphone Hcl] Nausea And Vomiting    MEDICATIONS: Current Outpatient Prescriptions on File Prior to Visit  Medication Sig Dispense Refill  . estradiol (VIVELLE-DOT) 0.1 MG/24HR patch Place 1 patch onto  the skin 2 (two) times a week.    [name] FLUoxetine (PROZAC) 20 MG tablet Take 1.5 tablets (30 mg total) by mouth daily. 135 tablet 1  . gabapentin (NEURONTIN) 300 MG capsule Take 300 mg by mouth 2 (two) times daily.     [name] losartan-hydrochlorothiazide (HYZAAR) 50-12.5 MG tablet daily at 2 PM.  3  . Melatonin 3 MG TABS Take by mouth as directed.    . Multiple Vitamins-Minerals (MULTIVITAMIN ADULT PO) Take by mouth daily.    . nabumetone (RELAFEN) 500 MG tablet Take 1 tablet (500 mg total) by mouth 2 (two) times daily. 60 tablet 1  . omeprazole (PRILOSEC) 40 MG capsule Take 1 capsule (40 mg total) by mouth daily. 30 capsule 2  . SUMAtriptan (IMITREX) 20 MG/ACT nasal spray PLACE 1 SPRAY INTO THE NOSE EVERY 2 HOURS AS NEEDED FOR. HEADACHE 6 Inhaler 2  . Vitamin D, Ergocalciferol, (DRISDOL) 50000 units CAPS capsule Take 1 capsule (50,000 Units total) by mouth every 7 (seven) days.  4 capsule 0  . [DISCONTINUED] rivaroxaban (XARELTO) 10 MG TABS tablet Take 1 tablet (10 mg total) by mouth daily with breakfast. 18 tablet 0   No current facility-administered medications on file prior to visit.     PAST MEDICAL HISTORY: Past Medical History:  Diagnosis Date  . Anxiety   . Back pain   . 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 Depression   . Essential hypertension, benign   . Gastritis   . GERD (gastroesophageal reflux disease)   . Hypertension   . Joint pain   . Lymphocytic colitis    microscopic  . Migraines   . Osteoarthritis   . Sciatica    right leg  . Swelling    feet, left foot    PAST SURGICAL HISTORY: Past Surgical History:  Procedure Laterality Date  . ABDOMINAL HYSTERECTOMY  07-2005  . [place] Bunionectomy Left 09/18/2016   Left Foot  . BACK SURGERY    . KNEE SURGERY  2006   left; multiple  . microdiscetomy  2005  . SHOULDER SURGERY  07-22-09   right  . SKIN CANCER EXCISION     eyelid  . TOTAL KNEE ARTHROPLASTY  07/27/2011   Procedure: TOTAL KNEE ARTHROPLASTY;  Surgeon: Gearlean Alf;  Location: [facility];  Service: Orthopedics;  Laterality: Left;    SOCIAL HISTORY: Social History  Substance Use Topics  . Smoking status: Never Smoker  . Smokeless tobacco: Never Used  . Alcohol use Yes     Comment: occasionally    FAMILY HISTORY: Family History  Problem Relation Age of Onset  . Breast cancer    . Tongue cancer Mother   . Breast cancer Mother   . Anxiety disorder Mother   . Depression Mother   . Heart disease Father   . Hypertension Father   . Hyperlipidemia Father   . Stroke Paternal Grandmother   . Colon cancer Neg Hx     ROS: Review of Systems  Constitutional: Negative for weight loss.  Gastrointestinal: Negative for nausea and vomiting.  Musculoskeletal:       Negative muscle weakness     PHYSICAL EXAM: Blood pressure 117/72, pulse 67, temperature 98.5 F (36.9 C), temperature source Oral,  height 5\' 8"  (1.727 m), weight 219 lb (99.3 kg), SpO2 97 %. Body mass index is 33.3 kg/m. Physical Exam  Constitutional: She is oriented to person, place, and time. She appears well-developed and well-nourished.  Cardiovascular: Normal rate.  Pulmonary/Chest: Effort normal.  Musculoskeletal: Normal range of motion.  Neurological: She is oriented to person, place, and time.  Skin: Skin is warm and dry.  Psychiatric: She has a normal mood and affect. Her behavior is normal.  Vitals reviewed.   RECENT LABS AND TESTS: BMET    Component Value Date/Time   NA 139 08/03/2016 1458   K 3.7 08/03/2016 1458   CL 98 08/03/2016 1458   CO2 26 08/03/2016 1458   GLUCOSE 75 08/03/2016 1458   GLUCOSE 79 03/26/2016 1600   BUN 12 08/03/2016 1458   CREATININE 0.61 08/03/2016 1458   CREATININE 0.61 03/26/2016 1600   CALCIUM 9.1 08/03/2016 1458   GFRNONAA 102 08/03/2016 1458   GFRNONAA >89 03/26/2016 1600   GFRAA 117 08/03/2016 1458   GFRAA >89 03/26/2016 1600   Lab Results  Component Value Date   HGBA1C 4.9 08/03/2016   Lab Results  Component Value Date   INSULIN 5.9 08/03/2016   CBC    Component Value Date/Time   WBC 6.4 08/03/2016 1458   WBC 6.7 03/26/2016 1600   RBC 4.56 08/03/2016 1458   RBC 4.68 03/26/2016 1600   HGB 14.3 03/26/2016 1600   HCT 41.3 08/03/2016 1458   PLT 287 03/26/2016 1600   MCV 91 08/03/2016 1458   MCH 29.8 08/03/2016 1458   MCH 30.6 03/26/2016 1600   MCHC 32.9 08/03/2016 1458   MCHC 33.6 03/26/2016 1600   RDW 12.9 08/03/2016 1458   LYMPHSABS 1.8 08/03/2016 1458   MONOABS 469 03/26/2016 1600   EOSABS 0.2 08/03/2016 1458   BASOSABS 0.0 08/03/2016 1458   Iron/TIBC/Ferritin/ %Sat    Component Value Date/Time   FERRITIN 142 12/25/2015 0912   Lipid Panel     Component Value Date/Time   CHOL 236 (H) 08/03/2016 1458   TRIG 97 08/03/2016 1458   HDL 61 08/03/2016 1458   CHOLHDL 3.0 04/05/2015 0937   VLDL 21 04/05/2015 0937   LDLCALC 156 (H)  08/03/2016 1458   Hepatic Function Panel     Component Value Date/Time   PROT 7.0 08/03/2016 1458   ALBUMIN 4.6 08/03/2016 1458   AST 26 08/03/2016 1458   ALT 33 (H) 08/03/2016 1458   ALKPHOS 58 08/03/2016 1458   BILITOT 0.7 08/03/2016 1458   BILIDIR 0.1 12/25/2015 0912   IBILI 0.5 12/25/2015 0912      Component Value Date/Time   TSH 1.470 08/03/2016 1458   TSH 2.09 03/26/2016 1600   TSH 3.10 12/25/2015 0912    ASSESSMENT AND PLAN: Vitamin D deficiency - Plan: Vitamin D, Ergocalciferol, (DRISDOL) 50000 units CAPS capsule  Class 1 obesity without serious comorbidity with body mass index (BMI) of 33.0 to 33.9 in adult, unspecified obesity type  PLAN:  Vitamin D Deficiency Kathy Clark was informed that low vitamin D levels contributes to fatigue and are associated with obesity, breast, and colon cancer. She agrees to continue to take prescription Vit D @50 ,000 IU every week, we will refill for 1 month and will follow up for routine testing of vitamin D, at least 2-3 times per year. She was informed of the risk of over-replacement of vitamin D and agrees to not increase her dose unless he discusses this with [place] first.  Obesity Kathy Clark is currently in the action stage of change. As such, her goal is to continue with weight loss efforts She has agreed to keep a food journal with 1100 to 1400 calories and 75 grams of protein daily Kathy Clark has been instructed  to work up to a goal of 150 minutes of combined cardio and strengthening exercise per week for weight loss and overall health benefits. We discussed the following Behavioral Modification Stratagies today: increasing lean protein intake, decrease snacking  and ways to avoid boredom eating  Kathy Clark has agreed to follow up with our clinic in 2 weeks. She was informed of the importance of frequent follow up visits to maximize her success with intensive lifestyle modifications for her multiple health conditions.  I, Doreene Nest, am acting as  scribe for Dennard Nip, MD  I have reviewed the above documentation for accuracy and completeness, and I agree with the above. -Dennard Nip, MD

## 2016-10-14 ENCOUNTER — Encounter (INDEPENDENT_AMBULATORY_CARE_PROVIDER_SITE_OTHER): Payer: Self-pay | Admitting: Family Medicine

## 2016-10-14 MED FILL — VIT D2 1.25 MG (50,000 UNIT: 1.25 MG | 28 days supply | Qty: 4 | Fill #0

## 2016-10-15 ENCOUNTER — Ambulatory Visit (INDEPENDENT_AMBULATORY_CARE_PROVIDER_SITE_OTHER): Payer: 59 | Admitting: Podiatry

## 2016-10-15 ENCOUNTER — Encounter: Payer: Self-pay | Admitting: Podiatry

## 2016-10-15 DIAGNOSIS — Z9889 Other specified postprocedural states: Secondary | ICD-10-CM

## 2016-10-15 MED FILL — NABUMETONE 500 MG TABLET: 500 | 30 days supply | Qty: 60 | Fill #1

## 2016-10-15 NOTE — Patient Instructions (Signed)
4 weeks post op following [place] bunionectomy left foot. Doing well with reduced deformity and good mobility. Continue with passive range of motion. My use regular tennis shoe without active propulsion using the big toe. Return in one month.

## 2016-10-15 NOTE — Progress Notes (Signed)
4 week post op following [place] bunionectomy left foot, 09/18/16. Wound healing normal without complication.  No abnormal scar. Good range of motion at the first MPJ. May use compression stockinet with regular shoes. Return in 4 weeks.

## 2016-10-28 ENCOUNTER — Other Ambulatory Visit: Payer: Self-pay | Admitting: Podiatry

## 2016-11-02 ENCOUNTER — Ambulatory Visit (INDEPENDENT_AMBULATORY_CARE_PROVIDER_SITE_OTHER): Payer: 59 | Admitting: Family Medicine

## 2016-11-02 VITALS — BP 110/76 | HR 65 | Temp 97.6°F | Ht 68.0 in | Wt 218.0 lb

## 2016-11-02 DIAGNOSIS — E559 Vitamin D deficiency, unspecified: Secondary | ICD-10-CM | POA: Diagnosis not present

## 2016-11-02 DIAGNOSIS — Z9189 Other specified personal risk factors, not elsewhere classified: Secondary | ICD-10-CM | POA: Diagnosis not present

## 2016-11-02 DIAGNOSIS — Z6833 Body mass index (BMI) 33.0-33.9, adult: Secondary | ICD-10-CM

## 2016-11-02 DIAGNOSIS — E669 Obesity, unspecified: Secondary | ICD-10-CM

## 2016-11-02 MED ORDER — VITAMIN D (ERGOCALCIFEROL) 1.25 MG (50000 UNIT) PO CAPS: 50000.0000 [IU] | ORAL_CAPSULE | ORAL | 0 refills | Status: DC

## 2016-11-02 NOTE — Progress Notes (Signed)
Office: [phone]  /  Fax: [phone]   HPI:   Chief Complaint: OBESITY Kathy Clark is here to discuss her progress with her obesity treatment plan. She is following her eating plan approximately 100 % of the time and states she is exercising 10 minutes 4 to 5 times per week. Kathy Clark is doing well with journaling. She is working on eating all her calories and protein. She is doing well with meal prep but sometimes goes too long between lunch and supper. Her weight is 218 lb (98.9 kg) today and has had a weight loss of 1 pounds over a period of 3 weeks since her last visit. She has lost 19 lbs since starting treatment with [place].  Vitamin D deficiency Kathy Clark has a diagnosis of vitamin D deficiency. She is currently stable on prescription vit D, not yet at goal and denies nausea, vomiting or muscle weakness.  At risk for diabetes Kathy Clark is at higher than average risk for developing diabetes due to her obesity. She currently denies polyuria or polydipsia.  Wt Readings from Last 500 Encounters:  11/02/16 218 lb (98.9 kg)  10/13/16 219 lb (99.3 kg)  09/29/16 219 lb (99.3 kg)  09/16/16 221 lb (100.2 kg)  09/01/16 224 lb (101.6 kg)  08/31/16 224 lb (101.6 kg)  08/17/16 238 lb 6.4 oz (108.1 kg)  08/17/16 229 lb (103.9 kg)  08/03/16 237 lb (107.5 kg)  07/10/16 241 lb (109.3 kg)  07/08/16 241 lb 4 oz (109.4 kg)  05/19/16 232 lb (105.2 kg)  04/06/16 236 lb 3.2 oz (107.1 kg)  03/26/16 237 lb (107.5 kg)  02/20/16 235 lb (106.6 kg)  01/21/16 232 lb (105.2 kg)  12/25/15 230 lb (104.3 kg)  12/18/15 228 lb (103.4 kg)  11/06/15 223 lb (101.2 kg)  10/23/15 223 lb 12.8 oz (101.5 kg)  10/15/15 220 lb (99.8 kg)  08/23/15 220 lb 8 oz (100 kg)  07/26/15 217 lb (98.4 kg)  07/11/15 222 lb (100.7 kg)  06/13/15 217 lb (98.4 kg)  04/23/15 220 lb (99.8 kg)  03/12/15 217 lb (98.4 kg)  01/22/15 216 lb (98 kg)  12/24/14 224 lb (101.6 kg)  09/28/14 222 lb (100.7 kg)  08/07/14 226 lb (102.5 kg)  06/21/14  235 lb (106.6 kg)  03/14/14 226 lb (102.5 kg)  12/14/13 221 lb (100.2 kg)  08/14/13 217 lb (98.4 kg)  06/19/13 210 lb (95.3 kg)  05/19/13 210 lb (95.3 kg)  04/21/13 219 lb (99.3 kg)  03/24/13 214 lb (97.1 kg)  03/10/13 213 lb (96.6 kg)  02/24/13 215 lb (97.5 kg)  01/26/13 219 lb (99.3 kg)  04/19/12 211 lb (95.7 kg)  01/01/12 208 lb (94.3 kg)  11/03/11 214 lb (97.1 kg)  10/05/11 218 lb (98.9 kg)  07/27/11 210 lb (95.3 kg)  07/20/11 218 lb (98.9 kg)  07/17/11 217 lb (98.4 kg)  07/15/11 216 lb (98 kg)  03/04/11 210 lb (95.3 kg)  01/13/11 210 lb (95.3 kg)  02/03/10 218 lb (98.9 kg)  12/12/09 (!) 225 lb (102.1 kg)  10/22/09 (!) 226 lb (102.5 kg)  12/05/08 221 lb 4 oz (100.4 kg)  11/05/08 220 lb (99.8 kg)  05/31/08 212 lb (96.2 kg)  01/09/08 213 lb 4 oz (96.7 kg)  11/10/07 216 lb (98 kg)  05/20/07 (!) 229 lb (103.9 kg)  02/01/07 (!) 225 lb (102.1 kg)  12/31/06 (!) 225 lb (102.1 kg)  12/21/06 (!) 225 lb (102.1 kg)  11/23/06 (!) 231 lb (104.8 kg)  05/19/06 [name])  224 lb (101.6 kg)     ALLERGIES: Allergies  Allergen Reactions  . Succinylcholine Anaphylaxis  . Dilaudid [Hydromorphone Hcl] Nausea And Vomiting    MEDICATIONS: Current Outpatient Prescriptions on File Prior to Visit  Medication Sig Dispense Refill  . estradiol (VIVELLE-DOT) 0.1 MG/24HR patch Place 1 patch onto the skin 2 (two) times a week.    [name] FLUoxetine (PROZAC) 20 MG tablet Take 1.5 tablets (30 mg total) by mouth daily. 135 tablet 1  . gabapentin (NEURONTIN) 300 MG capsule Take 300 mg by mouth 2 (two) times daily.     [name] losartan-hydrochlorothiazide (HYZAAR) 50-12.5 MG tablet daily at 2 PM.  3  . Melatonin 3 MG TABS Take by mouth as directed.    . Multiple Vitamins-Minerals (MULTIVITAMIN ADULT PO) Take by mouth daily.    . nabumetone (RELAFEN) 500 MG tablet TAKE 1 TABLET (500 MG TOTAL) BY MOUTH 2 (TWO) TIMES DAILY. 60 tablet 1  . omeprazole (PRILOSEC) 40 MG capsule Take 1 capsule (40 mg total) by mouth daily.  30 capsule 2  . SUMAtriptan (IMITREX) 20 MG/ACT nasal spray PLACE 1 SPRAY INTO THE NOSE EVERY 2 HOURS AS NEEDED FOR. HEADACHE 6 Inhaler 2  . [DISCONTINUED] rivaroxaban (XARELTO) 10 MG TABS tablet Take 1 tablet (10 mg total) by mouth daily with breakfast. 18 tablet 0   No current facility-administered medications on file prior to visit.     PAST MEDICAL HISTORY: Past Medical History:  Diagnosis Date  . Anxiety   . Back pain   . 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 Depression   . Essential hypertension, benign   . Gastritis   . GERD (gastroesophageal reflux disease)   . Hypertension   . Joint pain   . Lymphocytic colitis    microscopic  . Migraines   . Osteoarthritis   . Sciatica    right leg  . Swelling    feet, left foot    PAST SURGICAL HISTORY: Past Surgical History:  Procedure Laterality Date  . ABDOMINAL HYSTERECTOMY  07-2005  . [place] Bunionectomy Left 09/18/2016   Left Foot  . BACK SURGERY    . KNEE SURGERY  2006   left; multiple  . microdiscetomy  2005  . SHOULDER SURGERY  07-22-09   right  . SKIN CANCER EXCISION     eyelid  . TOTAL KNEE ARTHROPLASTY  07/27/2011   Procedure: TOTAL KNEE ARTHROPLASTY;  Surgeon: Gearlean Alf;  Location: [facility];  Service: Orthopedics;  Laterality: Left;    SOCIAL HISTORY: Social History  Substance Use Topics  . Smoking status: Never Smoker  . Smokeless tobacco: Never Used  . Alcohol use Yes     Comment: occasionally    FAMILY HISTORY: Family History  Problem Relation Age of Onset  . Breast cancer    . Tongue cancer Mother   . Breast cancer Mother   . Anxiety disorder Mother   . Depression Mother   . Heart disease Father   . Hypertension Father   . Hyperlipidemia Father   . Stroke Paternal Grandmother   . Colon cancer Neg Hx     ROS: Review of Systems  Constitutional: Positive for weight loss.  Gastrointestinal: Negative for nausea and vomiting.  Genitourinary: Negative for  frequency.  Musculoskeletal:       Negative muscle weakness  Endo/Heme/Allergies: Negative for polydipsia.    PHYSICAL EXAM: Blood pressure 110/76, pulse 65, temperature 97.6 F (36.4 C), temperature source Oral, height 5\' 8"  (  1.727 m), weight 218 lb (98.9 kg), SpO2 98 %. Body mass index is 33.15 kg/m. Physical Exam  Constitutional: She is oriented to person, place, and time. She appears well-developed and well-nourished.  Cardiovascular: Normal rate.   Pulmonary/Chest: Effort normal.  Musculoskeletal: Normal range of motion.  Neurological: She is oriented to person, place, and time.  Skin: Skin is warm and dry.  Psychiatric: She has a normal mood and affect. Her behavior is normal.  Vitals reviewed.   RECENT LABS AND TESTS: BMET    Component Value Date/Time   NA 139 08/03/2016 1458   K 3.7 08/03/2016 1458   CL 98 08/03/2016 1458   CO2 26 08/03/2016 1458   GLUCOSE 75 08/03/2016 1458   GLUCOSE 79 03/26/2016 1600   BUN 12 08/03/2016 1458   CREATININE 0.61 08/03/2016 1458   CREATININE 0.61 03/26/2016 1600   CALCIUM 9.1 08/03/2016 1458   GFRNONAA 102 08/03/2016 1458   GFRNONAA >89 03/26/2016 1600   GFRAA 117 08/03/2016 1458   GFRAA >89 03/26/2016 1600   Lab Results  Component Value Date   HGBA1C 4.9 08/03/2016   Lab Results  Component Value Date   INSULIN 5.9 08/03/2016   CBC    Component Value Date/Time   WBC 6.4 08/03/2016 1458   WBC 6.7 03/26/2016 1600   RBC 4.56 08/03/2016 1458   RBC 4.68 03/26/2016 1600   HGB 14.3 03/26/2016 1600   HCT 41.3 08/03/2016 1458   PLT 287 03/26/2016 1600   MCV 91 08/03/2016 1458   MCH 29.8 08/03/2016 1458   MCH 30.6 03/26/2016 1600   MCHC 32.9 08/03/2016 1458   MCHC 33.6 03/26/2016 1600   RDW 12.9 08/03/2016 1458   LYMPHSABS 1.8 08/03/2016 1458   MONOABS 469 03/26/2016 1600   EOSABS 0.2 08/03/2016 1458   BASOSABS 0.0 08/03/2016 1458   Iron/TIBC/Ferritin/ %Sat    Component Value Date/Time   FERRITIN 142 12/25/2015  0912   Lipid Panel     Component Value Date/Time   CHOL 236 (H) 08/03/2016 1458   TRIG 97 08/03/2016 1458   HDL 61 08/03/2016 1458   CHOLHDL 3.0 04/05/2015 0937   VLDL 21 04/05/2015 0937   LDLCALC 156 (H) 08/03/2016 1458   Hepatic Function Panel     Component Value Date/Time   PROT 7.0 08/03/2016 1458   ALBUMIN 4.6 08/03/2016 1458   AST 26 08/03/2016 1458   ALT 33 (H) 08/03/2016 1458   ALKPHOS 58 08/03/2016 1458   BILITOT 0.7 08/03/2016 1458   BILIDIR 0.1 12/25/2015 0912   IBILI 0.5 12/25/2015 0912      Component Value Date/Time   TSH 1.470 08/03/2016 1458   TSH 2.09 03/26/2016 1600   TSH 3.10 12/25/2015 0912    ASSESSMENT AND PLAN: Vitamin D deficiency - Plan: Vitamin D, Ergocalciferol, (DRISDOL) 50000 units CAPS capsule  At risk for diabetes mellitus  Class 1 obesity without serious comorbidity with body mass index (BMI) of 33.0 to 33.9 in adult, unspecified obesity type  PLAN:  Vitamin D Deficiency Crimson was informed that low vitamin D levels contributes to fatigue and are associated with obesity, breast, and colon cancer. She agrees to continue to take prescription Vit D @50 ,000 IU every week, we will refill for 1 month and will follow up for routine testing of vitamin D, at least 2-3 times per year. She was informed of the risk of over-replacement of vitamin D and agrees to not increase her dose unless he discusses this with [place] first.  Raianna agrees to follow up with our clinic in 2 weeks.  Diabetes risk counselling Heena was given extended (at least 15 minutes) diabetes prevention counseling today. She is 56 y.o. female and has risk factors for diabetes including obesity. We discussed intensive lifestyle modifications today with an emphasis on weight loss as well as increasing exercise and decreasing simple carbohydrates in her diet.  Obesity Halina is currently in the action stage of change. As such, her goal is to continue with weight loss efforts She has agreed  to keep a food journal with 1100 to 1400 calories and 75+ grams of protein daily Tim has been instructed to work up to a goal of 150 minutes of combined cardio and strengthening exercise per week for weight loss and overall health benefits. We discussed the following Behavioral Modification Stratagies today: increasing lean protein intake and increasing lower sugar fruits  Sofiya has agreed to follow up with our clinic in 2 weeks. She was informed of the importance of frequent follow up visits to maximize her success with intensive lifestyle modifications for her multiple health conditions.  I, Doreene Nest, am acting as scribe for Dennard Nip, MD  I have reviewed the above documentation for accuracy and completeness, and I agree with the above. -Dennard Nip, MD

## 2016-11-04 DIAGNOSIS — H5213 Myopia, bilateral: Secondary | ICD-10-CM | POA: Diagnosis not present

## 2016-11-05 ENCOUNTER — Other Ambulatory Visit: Payer: Self-pay | Admitting: *Deleted

## 2016-11-05 MED ORDER — FLUOXETINE HCL 20 MG PO TABS: 30.0000 mg | ORAL_TABLET | Freq: Every day | ORAL | 1 refills | Status: DC

## 2016-11-05 MED FILL — FLUoxetine HCL 20 MG TABS: 20 | 90 days supply | Qty: 135 | Fill #0

## 2016-11-10 MED FILL — OLOPATADINE HCL 0.1 % SOLN: 0.1 | 30 days supply | Qty: 5 | Fill #0

## 2016-11-12 ENCOUNTER — Encounter: Payer: Self-pay | Admitting: Podiatry

## 2016-11-12 ENCOUNTER — Ambulatory Visit (INDEPENDENT_AMBULATORY_CARE_PROVIDER_SITE_OTHER): Payer: 59 | Admitting: Podiatry

## 2016-11-12 DIAGNOSIS — Z9889 Other specified postprocedural states: Secondary | ICD-10-CM

## 2016-11-12 NOTE — Patient Instructions (Signed)
2 month follow up on right foot bunion surgery. Noted of good joint motion with minimum discomfort. May use compression stockinet as needed. Return prn.

## 2016-11-12 NOTE — Progress Notes (Signed)
8weeks post op following [place] bunionectomy left foot, 09/18/16. Wound healing normal without complication.  No edema or erythema noted. Good range of motion at the first MPJ. May use compression stockinet as needed. Return prn.

## 2016-11-16 ENCOUNTER — Encounter: Payer: Self-pay | Admitting: Family Medicine

## 2016-11-16 ENCOUNTER — Ambulatory Visit (INDEPENDENT_AMBULATORY_CARE_PROVIDER_SITE_OTHER): Payer: 59 | Admitting: Family Medicine

## 2016-11-16 VITALS — BP 129/75 | HR 66 | Ht 68.0 in | Wt 221.0 lb

## 2016-11-16 DIAGNOSIS — R42 Dizziness and giddiness: Secondary | ICD-10-CM

## 2016-11-16 DIAGNOSIS — R0683 Snoring: Secondary | ICD-10-CM

## 2016-11-16 DIAGNOSIS — F418 Other specified anxiety disorders: Secondary | ICD-10-CM

## 2016-11-16 DIAGNOSIS — F329 Major depressive disorder, single episode, unspecified: Secondary | ICD-10-CM | POA: Diagnosis not present

## 2016-11-16 DIAGNOSIS — I10 Essential (primary) hypertension: Secondary | ICD-10-CM | POA: Diagnosis not present

## 2016-11-16 DIAGNOSIS — F419 Anxiety disorder, unspecified: Secondary | ICD-10-CM

## 2016-11-16 MED ORDER — FLUOXETINE HCL 40 MG PO CAPS: 40.0000 mg | ORAL_CAPSULE | Freq: Every day | ORAL | 3 refills | Status: DC

## 2016-11-16 MED ORDER — HYDROCHLOROTHIAZIDE 12.5 MG PO CAPS: 12.5000 mg | ORAL_CAPSULE | Freq: Every day | ORAL | 1 refills | Status: DC

## 2016-11-16 MED ORDER — LOSARTAN POTASSIUM 25 MG PO TABS: 25.0000 mg | ORAL_TABLET | Freq: Every day | ORAL | 3 refills | Status: DC

## 2016-11-16 MED FILL — OMEPRAZOLE DR 40 MG CAPSULE: 40 | 30 days supply | Qty: 30 | Fill #2

## 2016-11-16 MED FILL — HYDROCHLOROTHIAZIDE 12.5 MG: 12.5 | 90 days supply | Qty: 90 | Fill #0

## 2016-11-16 MED FILL — LOSARTAN POTASSIUM 25 MG TA: 25 | 90 days supply | Qty: 90 | Fill #0

## 2016-11-16 NOTE — Progress Notes (Signed)
Subjective:    CC: HTN   HPI:  Hypertension- Pt denies chest pain, SOB, dizziness, or heart palpitations.  Taking meds as directed w/o problems.  Denies medication side effects.  She is working with her pediatrician at: And is doing great so far in her efforts for weight loss.  Depression/Anxiety - she feels like the prozac is not working well and thinks may need to go up to 40mg  again.    Husband notes she is snoring and pauses sleep at times. This is been going on for some time. The snoring seems to be worse when she is excessively tired. Most nights she only gets about 5-6 hours of sleep has been trying to work on this.  She has been Lightheaded when she stands up for about the last 2 weeks. She feels like everything is going to go dark. No recent changes to medications etc.  Past medical history, Surgical history, Family history not pertinant except as noted below, Social history, Allergies, and medications have been entered into the medical record, reviewed, and corrections made.   Review of Systems: No fevers, chills, night sweats, weight loss, chest pain, or shortness of breath.   Objective:    General: Well Developed, well nourished, and in no acute distress.  Neuro: Alert and oriented x3, extra-ocular muscles intact, sensation grossly intact.  HEENT: Normocephalic, atraumatic  Skin: Warm and dry, no rashes. Cardiac: Regular rate and rhythm, no murmurs rubs or gallops, no lower extremity edema.  Respiratory: Clear to auscultation bilaterally. Not using accessory muscles, speaking in full sentences.   Impression and Recommendations:    HTN - Well controlled. Continue current regimen. Follow up in  6 months. She is doing fantastic with her weight loss.  Depression/Anxiety - increase fluoxetine to 40 mg by mouth follow-up in 3 months.  Lightheadedness-orthostatics are negative. We'll decrease the losartan component of her medication. She wants to keep the diuretic component  because she does tend to swell at times.  Snoring-stopping questionnaire screening is positive. Score of 5.  Recommend further evaluation for split-night sleep study.

## 2016-11-17 MED FILL — GABAPENTIN 300 MG CAPSULE: 300 | 90 days supply | Qty: 180 | Fill #0

## 2016-11-19 ENCOUNTER — Ambulatory Visit (INDEPENDENT_AMBULATORY_CARE_PROVIDER_SITE_OTHER): Payer: 59 | Admitting: Family Medicine

## 2016-11-19 VITALS — BP 127/84 | HR 69 | Temp 97.6°F | Ht 68.0 in | Wt 218.0 lb

## 2016-11-19 DIAGNOSIS — F3289 Other specified depressive episodes: Secondary | ICD-10-CM

## 2016-11-19 DIAGNOSIS — Z6833 Body mass index (BMI) 33.0-33.9, adult: Secondary | ICD-10-CM

## 2016-11-19 DIAGNOSIS — E669 Obesity, unspecified: Secondary | ICD-10-CM

## 2016-11-23 NOTE — Progress Notes (Signed)
Office: [phone]  /  Fax: [phone]   HPI:   Chief Complaint: OBESITY Kathy Clark is here to discuss her progress with her obesity treatment plan. She is on the  keep a food journal with 1100 to 1400 calories and 75+ grams of protein daily and is following her eating plan approximately 90 % of the time. She states she is exercising 0 minutes 0 times per week. Kathy Clark was travelling and increased eating out . She did well with portion control/smart choices. She was journaling on and off. She still eats out frequently in the pm. Kathy Clark is ready to get back on track. Her weight is 218 lb (98.9 kg) today and has maintained weight over a period of 2 weeks since her last visit. She has lost 19 lbs since starting treatment with [place].  Depression with emotional eating behaviors Kathy Clark is on prozac. She had decreased dose and did well. Her mood is stable but she is still struggling with emotional eating and using food for comfort to the extent that it is negatively impacting her health. She often snacks when she is not hungry. Kathy Clark sometimes feels she is out of control and then feels guilty that she made poor food choices. She has been working on behavior modification techniques to help reduce her emotional eating and has been somewhat successful. She shows no sign of suicidal or homicidal ideations.  Depression screen [facility] 2/9 11/16/2016 08/03/2016 01/21/2016 08/26/2015 07/11/2015  Decreased Interest 2 3 0 1 1  Down, Depressed, Hopeless 2 3 0 1 2  PHQ - 2 Score 4 6 0 2 3  Altered sleeping 3 3 2 2 2   Tired, decreased energy 3 3 2 2 2   Change in appetite 1 3 1  0 2  Feeling bad or failure about yourself  1 3 0 0 1  Trouble concentrating 2 2 1  0 1  Moving slowly or fidgety/restless 1 0 0 0 0  Suicidal thoughts 0 0 0 0 0  PHQ-9 Score 15 20 6 6 11   Difficult doing work/chores - - Somewhat difficult Somewhat difficult Somewhat difficult     Wt Readings from Last 500 Encounters:  11/19/16 218 lb (98.9 kg)    11/16/16 221 lb (100.2 kg)  11/02/16 218 lb (98.9 kg)  10/13/16 219 lb (99.3 kg)  09/29/16 219 lb (99.3 kg)  09/16/16 221 lb (100.2 kg)  09/01/16 224 lb (101.6 kg)  08/31/16 224 lb (101.6 kg)  08/17/16 238 lb 6.4 oz (108.1 kg)  08/17/16 229 lb (103.9 kg)  08/03/16 237 lb (107.5 kg)  07/10/16 241 lb (109.3 kg)  07/08/16 241 lb 4 oz (109.4 kg)  05/19/16 232 lb (105.2 kg)  04/06/16 236 lb 3.2 oz (107.1 kg)  03/26/16 237 lb (107.5 kg)  02/20/16 235 lb (106.6 kg)  01/21/16 232 lb (105.2 kg)  12/25/15 230 lb (104.3 kg)  12/18/15 228 lb (103.4 kg)  11/06/15 223 lb (101.2 kg)  10/23/15 223 lb 12.8 oz (101.5 kg)  10/15/15 220 lb (99.8 kg)  08/23/15 220 lb 8 oz (100 kg)  07/26/15 217 lb (98.4 kg)  07/11/15 222 lb (100.7 kg)  06/13/15 217 lb (98.4 kg)  04/23/15 220 lb (99.8 kg)  03/12/15 217 lb (98.4 kg)  01/22/15 216 lb (98 kg)  12/24/14 224 lb (101.6 kg)  09/28/14 222 lb (100.7 kg)  08/07/14 226 lb (102.5 kg)  06/21/14 235 lb (106.6 kg)  03/14/14 226 lb (102.5 kg)  12/14/13 221 lb (100.2 kg)  08/14/13  217 lb (98.4 kg)  06/19/13 210 lb (95.3 kg)  05/19/13 210 lb (95.3 kg)  04/21/13 219 lb (99.3 kg)  03/24/13 214 lb (97.1 kg)  03/10/13 213 lb (96.6 kg)  02/24/13 215 lb (97.5 kg)  01/26/13 219 lb (99.3 kg)  04/19/12 211 lb (95.7 kg)  01/01/12 208 lb (94.3 kg)  11/03/11 214 lb (97.1 kg)  10/05/11 218 lb (98.9 kg)  07/27/11 210 lb (95.3 kg)  07/20/11 218 lb (98.9 kg)  07/17/11 217 lb (98.4 kg)  07/15/11 216 lb (98 kg)  03/04/11 210 lb (95.3 kg)  01/13/11 210 lb (95.3 kg)  02/03/10 218 lb (98.9 kg)  12/12/09 (!) 225 lb (102.1 kg)  10/22/09 (!) 226 lb (102.5 kg)  12/05/08 221 lb 4 oz (100.4 kg)  11/05/08 220 lb (99.8 kg)  05/31/08 212 lb (96.2 kg)  01/09/08 213 lb 4 oz (96.7 kg)  11/10/07 216 lb (98 kg)  05/20/07 (!) 229 lb (103.9 kg)  02/01/07 (!) 225 lb (102.1 kg)  12/31/06 (!) 225 lb (102.1 kg)  12/21/06 (!) 225 lb (102.1 kg)  11/23/06 (!) 231 lb (104.8 kg)   05/19/06 (!) 224 lb (101.6 kg)     ALLERGIES: Allergies  Allergen Reactions  . Succinylcholine Anaphylaxis  . Dilaudid [Hydromorphone Hcl] Nausea And Vomiting    MEDICATIONS: Current Outpatient Prescriptions on File Prior to Visit  Medication Sig Dispense Refill  . estradiol (VIVELLE-DOT) 0.075 MG/24HR   3  . FLUoxetine (PROZAC) 40 MG capsule Take 1 capsule (40 mg total) by mouth daily. 90 capsule 3  . gabapentin (NEURONTIN) 300 MG capsule Take 300 mg by mouth 2 (two) times daily.     . hydrochlorothiazide (MICROZIDE) 12.5 MG capsule Take 1 capsule (12.5 mg total) by mouth daily. 90 capsule 1  . losartan (COZAAR) 25 MG tablet Take 1 tablet (25 mg total) by mouth daily. 90 tablet 3  . Melatonin 3 MG TABS Take by mouth as directed.    . Multiple Vitamins-Minerals (MULTIVITAMIN ADULT PO) Take by mouth daily.    . nabumetone (RELAFEN) 500 MG tablet TAKE 1 TABLET (500 MG TOTAL) BY MOUTH 2 (TWO) TIMES DAILY. 60 tablet 1  . olopatadine (PATANOL) 0.1 % ophthalmic solution     . omeprazole (PRILOSEC) 40 MG capsule Take 1 capsule (40 mg total) by mouth daily. 30 capsule 2  . SUMAtriptan (IMITREX) 20 MG/ACT nasal spray PLACE 1 SPRAY INTO THE NOSE EVERY 2 HOURS AS NEEDED FOR. HEADACHE 6 Inhaler 2  . Vitamin D, Ergocalciferol, (DRISDOL) 50000 units CAPS capsule Take 1 capsule (50,000 Units total) by mouth every 7 (seven) days. 4 capsule 0  . [DISCONTINUED] rivaroxaban (XARELTO) 10 MG TABS tablet Take 1 tablet (10 mg total) by mouth daily with breakfast. 18 tablet 0   No current facility-administered medications on file prior to visit.     PAST MEDICAL HISTORY: Past Medical History:  Diagnosis Date  . Anxiety   . Back pain   . 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 Depression   . Essential hypertension, benign   . Gastritis   . GERD (gastroesophageal reflux disease)   . Hypertension   . Joint pain   . Lymphocytic colitis    microscopic  . Migraines   .  Osteoarthritis   . Sciatica    right leg  . Swelling    feet, left foot    PAST SURGICAL HISTORY: Past Surgical History:  Procedure Laterality Date  . ABDOMINAL  HYSTERECTOMY  07-2005  . [place] Bunionectomy Left 09/18/2016   Left Foot  . BACK SURGERY    . KNEE SURGERY  2006   left; multiple  . microdiscetomy  2005  . SHOULDER SURGERY  07-22-09   right  . SKIN CANCER EXCISION     eyelid  . TOTAL KNEE ARTHROPLASTY  07/27/2011   Procedure: TOTAL KNEE ARTHROPLASTY;  Surgeon: Gearlean Alf;  Location: [facility];  Service: Orthopedics;  Laterality: Left;    SOCIAL HISTORY: Social History  Substance Use Topics  . Smoking status: Never Smoker  . Smokeless tobacco: Never Used  . Alcohol use Yes     Comment: occasionally    FAMILY HISTORY: Family History  Problem Relation Age of Onset  . Breast cancer Unknown   . Tongue cancer Mother   . Breast cancer Mother   . Anxiety disorder Mother   . Depression Mother   . Heart disease Father   . Hypertension Father   . Hyperlipidemia Father   . Stroke Paternal Grandmother   . Colon cancer Neg Hx     ROS: Review of Systems  Constitutional: Negative for weight loss.  Psychiatric/Behavioral: Positive for depression. Negative for suicidal ideas.    PHYSICAL EXAM: Blood pressure 127/84, pulse 69, temperature 97.6 F (36.4 C), temperature source Oral, height 5\' 8"  (1.727 m), weight 218 lb (98.9 kg), SpO2 99 %. Body mass index is 33.15 kg/m. Physical Exam  Constitutional: She is oriented to person, place, and time. She appears well-developed and well-nourished.  Cardiovascular: Normal rate.   Pulmonary/Chest: Effort normal.  Musculoskeletal: Normal range of motion.  Neurological: She is oriented to person, place, and time.  Skin: Skin is warm and dry.  Vitals reviewed.   RECENT LABS AND TESTS: BMET    Component Value Date/Time   NA 139 08/03/2016 1458   K 3.7 08/03/2016 1458   CL 98 08/03/2016 1458   CO2 26 08/03/2016  1458   GLUCOSE 75 08/03/2016 1458   GLUCOSE 79 03/26/2016 1600   BUN 12 08/03/2016 1458   CREATININE 0.61 08/03/2016 1458   CREATININE 0.61 03/26/2016 1600   CALCIUM 9.1 08/03/2016 1458   GFRNONAA 102 08/03/2016 1458   GFRNONAA >89 03/26/2016 1600   GFRAA 117 08/03/2016 1458   GFRAA >89 03/26/2016 1600   Lab Results  Component Value Date   HGBA1C 4.9 08/03/2016   Lab Results  Component Value Date   INSULIN 5.9 08/03/2016   CBC    Component Value Date/Time   WBC 6.4 08/03/2016 1458   WBC 6.7 03/26/2016 1600   RBC 4.56 08/03/2016 1458   RBC 4.68 03/26/2016 1600   HGB 14.3 03/26/2016 1600   HCT 41.3 08/03/2016 1458   PLT 287 03/26/2016 1600   MCV 91 08/03/2016 1458   MCH 29.8 08/03/2016 1458   MCH 30.6 03/26/2016 1600   MCHC 32.9 08/03/2016 1458   MCHC 33.6 03/26/2016 1600   RDW 12.9 08/03/2016 1458   LYMPHSABS 1.8 08/03/2016 1458   MONOABS 469 03/26/2016 1600   EOSABS 0.2 08/03/2016 1458   BASOSABS 0.0 08/03/2016 1458   Iron/TIBC/Ferritin/ %Sat    Component Value Date/Time   FERRITIN 142 12/25/2015 0912   Lipid Panel     Component Value Date/Time   CHOL 236 (H) 08/03/2016 1458   TRIG 97 08/03/2016 1458   HDL 61 08/03/2016 1458   CHOLHDL 3.0 04/05/2015 0937   VLDL 21 04/05/2015 0937   LDLCALC 156 (H) 08/03/2016 1458   Hepatic  Function Panel     Component Value Date/Time   PROT 7.0 08/03/2016 1458   ALBUMIN 4.6 08/03/2016 1458   AST 26 08/03/2016 1458   ALT 33 (H) 08/03/2016 1458   ALKPHOS 58 08/03/2016 1458   BILITOT 0.7 08/03/2016 1458   BILIDIR 0.1 12/25/2015 0912   IBILI 0.5 12/25/2015 0912      Component Value Date/Time   TSH 1.470 08/03/2016 1458   TSH 2.09 03/26/2016 1600   TSH 3.10 12/25/2015 0912    ASSESSMENT AND PLAN: Other depression  Class 1 obesity without serious comorbidity with body mass index (BMI) of 33.0 to 33.9 in adult, unspecified obesity type  PLAN:  Depression with Emotional Eating Behaviors We discussed behavior  modification techniques today to help Kathy Clark deal with her emotional eating and depression. She has agreed to continue to  take Prozac as directed and we will monitor closely. Kathy Clark agreed to follow up as directed.  We spent > than 50% of the 15 minute visit on the counseling as documented in the note.  Obesity Kathy Clark is currently in the action stage of change. As such, her goal is to continue with weight loss efforts She has agreed to keep a food journal with 1400 calories and 75 grams of protein daily Kathy Clark has been instructed to work up to a goal of 150 minutes of combined cardio and strengthening exercise per week for weight loss and overall health benefits. We discussed the following Behavioral Modification Stratagies today: increasing lean protein intake and emotional eating strategies  Kathy Clark has agreed to follow up with our clinic in 2 weeks. She was informed of the importance of frequent follow up visits to maximize her success with intensive lifestyle modifications for her multiple health conditions.  I, Doreene Nest, am acting as scribe for Dennard Nip, MD  I have reviewed the above documentation for accuracy and completeness, and I agree with the above. -Dennard Nip, MD

## 2016-12-01 ENCOUNTER — Ambulatory Visit (INDEPENDENT_AMBULATORY_CARE_PROVIDER_SITE_OTHER): Payer: 59 | Admitting: Family Medicine

## 2016-12-01 VITALS — BP 111/79 | HR 67 | Temp 98.1°F | Ht 68.0 in | Wt 220.0 lb

## 2016-12-01 DIAGNOSIS — E559 Vitamin D deficiency, unspecified: Secondary | ICD-10-CM

## 2016-12-01 DIAGNOSIS — Z6833 Body mass index (BMI) 33.0-33.9, adult: Secondary | ICD-10-CM

## 2016-12-01 DIAGNOSIS — E7849 Other hyperlipidemia: Secondary | ICD-10-CM

## 2016-12-01 DIAGNOSIS — E784 Other hyperlipidemia: Secondary | ICD-10-CM

## 2016-12-01 DIAGNOSIS — E669 Obesity, unspecified: Secondary | ICD-10-CM

## 2016-12-01 NOTE — Progress Notes (Signed)
Office: [phone]  /  Fax: [phone]   HPI:   Chief Complaint: OBESITY Kathy Clark is here to discuss her progress with her obesity treatment plan. She is on the  keep a food journal with 1400 calories and 75 grams of protein  and is following her eating plan approximately 70 % of the time. She states she is walking 10 minutes 5 times per week. Kathy Clark is off track for the last 2 weeks. She is still journaling on and off but increased cravings and temptations, especially from husband. Her weight is 220 lb (99.8 kg) today and has gained 2 pounds over a period of 2 weeks since her last visit. She has lost 17 lbs since starting treatment with [place].  Vitamin D deficiency Kathy Clark has a diagnosis of vitamin D deficiency. She is currently stable on vit D and denies nausea, vomiting or muscle weakness.  Hyperlipidemia Kathy Clark has hyperlipidemia and has been attempting to control her cholesterol levels with intensive lifestyle modification including a low saturated fat diet, exercise and weight loss. She denies any chest pain, claudication or myalgias. Kathy Clark is due for labs.  Elevated Liver Function Test   ALLERGIES: Allergies  Allergen Reactions  . Succinylcholine Anaphylaxis  . Dilaudid [Hydromorphone Hcl] Nausea And Vomiting    MEDICATIONS: Current Outpatient Prescriptions on File Prior to Visit  Medication Sig Dispense Refill  . estradiol (VIVELLE-DOT) 0.075 MG/24HR   3  . FLUoxetine (PROZAC) 40 MG capsule Take 1 capsule (40 mg total) by mouth daily. 90 capsule 3  . gabapentin (NEURONTIN) 300 MG capsule Take 300 mg by mouth 2 (two) times daily.     . hydrochlorothiazide (MICROZIDE) 12.5 MG capsule Take 1 capsule (12.5 mg total) by mouth daily. 90 capsule 1  . losartan (COZAAR) 25 MG tablet Take 1 tablet (25 mg total) by mouth daily. 90 tablet 3  . Melatonin 3 MG TABS Take by mouth as directed.    . Multiple Vitamins-Minerals (MULTIVITAMIN ADULT PO) Take by mouth daily.    . nabumetone  (RELAFEN) 500 MG tablet TAKE 1 TABLET (500 MG TOTAL) BY MOUTH 2 (TWO) TIMES DAILY. 60 tablet 1  . olopatadine (PATANOL) 0.1 % ophthalmic solution     . omeprazole (PRILOSEC) 40 MG capsule Take 1 capsule (40 mg total) by mouth daily. 30 capsule 2  . SUMAtriptan (IMITREX) 20 MG/ACT nasal spray PLACE 1 SPRAY INTO THE NOSE EVERY 2 HOURS AS NEEDED FOR. HEADACHE 6 Inhaler 2  . Vitamin D, Ergocalciferol, (DRISDOL) 50000 units CAPS capsule Take 1 capsule (50,000 Units total) by mouth every 7 (seven) days. 4 capsule 0  . [DISCONTINUED] rivaroxaban (XARELTO) 10 MG TABS tablet Take 1 tablet (10 mg total) by mouth daily with breakfast. 18 tablet 0   No current facility-administered medications on file prior to visit.     PAST MEDICAL HISTORY: Past Medical History:  Diagnosis Date  . Anxiety   . Back pain   . 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 Depression   . Essential hypertension, benign   . Gastritis   . GERD (gastroesophageal reflux disease)   . Hypertension   . Joint pain   . Lymphocytic colitis    microscopic  . Migraines   . Osteoarthritis   . Sciatica    right leg  . Swelling    feet, left foot    PAST SURGICAL HISTORY: Past Surgical History:  Procedure Laterality Date  . ABDOMINAL HYSTERECTOMY  07-2005  . [place] Bunionectomy  Left 09/18/2016   Left Foot  . BACK SURGERY    . KNEE SURGERY  2006   left; multiple  . microdiscetomy  2005  . SHOULDER SURGERY  07-22-09   right  . SKIN CANCER EXCISION     eyelid  . TOTAL KNEE ARTHROPLASTY  07/27/2011   Procedure: TOTAL KNEE ARTHROPLASTY;  Surgeon: Gearlean Alf;  Location: [facility];  Service: Orthopedics;  Laterality: Left;    SOCIAL HISTORY: Social History  Substance Use Topics  . Smoking status: Never Smoker  . Smokeless tobacco: Never Used  . Alcohol use Yes     Comment: occasionally    FAMILY HISTORY: Family History  Problem Relation Age of Onset  . Breast cancer Unknown   . Tongue cancer  Mother   . Breast cancer Mother   . Anxiety disorder Mother   . Depression Mother   . Heart disease Father   . Hypertension Father   . Hyperlipidemia Father   . Stroke Paternal Grandmother   . Colon cancer Neg Hx     ROS: Review of Systems  Constitutional: Negative for weight loss.  Cardiovascular: Negative for chest pain and claudication.  Gastrointestinal: Negative for nausea and vomiting.  Musculoskeletal: Negative for myalgias.       Negative muscle weakness    PHYSICAL EXAM: Blood pressure 111/79, pulse 67, temperature 98.1 F (36.7 C), temperature source Oral, height 5\' 8"  (1.727 m), weight 220 lb (99.8 kg), SpO2 98 %. Body mass index is 33.45 kg/m. Physical Exam  Constitutional: She is oriented to person, place, and time. She appears well-developed and well-nourished.  Pulmonary/Chest: Effort normal.  Musculoskeletal: Normal range of motion.  Neurological: She is oriented to person, place, and time.  Skin: Skin is warm and dry.  Psychiatric: She has a normal mood and affect. Her behavior is normal.  Vitals reviewed.   RECENT LABS AND TESTS: BMET    Component Value Date/Time   NA 139 08/03/2016 1458   K 3.7 08/03/2016 1458   CL 98 08/03/2016 1458   CO2 26 08/03/2016 1458   GLUCOSE 75 08/03/2016 1458   GLUCOSE 79 03/26/2016 1600   BUN 12 08/03/2016 1458   CREATININE 0.61 08/03/2016 1458   CREATININE 0.61 03/26/2016 1600   CALCIUM 9.1 08/03/2016 1458   GFRNONAA 102 08/03/2016 1458   GFRNONAA >89 03/26/2016 1600   GFRAA 117 08/03/2016 1458   GFRAA >89 03/26/2016 1600   Lab Results  Component Value Date   HGBA1C 4.9 08/03/2016   Lab Results  Component Value Date   INSULIN 5.9 08/03/2016   CBC    Component Value Date/Time   WBC 6.4 08/03/2016 1458   WBC 6.7 03/26/2016 1600   RBC 4.56 08/03/2016 1458   RBC 4.68 03/26/2016 1600   HGB 14.3 03/26/2016 1600   HCT 41.3 08/03/2016 1458   PLT 287 03/26/2016 1600   MCV 91 08/03/2016 1458   MCH 29.8  08/03/2016 1458   MCH 30.6 03/26/2016 1600   MCHC 32.9 08/03/2016 1458   MCHC 33.6 03/26/2016 1600   RDW 12.9 08/03/2016 1458   LYMPHSABS 1.8 08/03/2016 1458   MONOABS 469 03/26/2016 1600   EOSABS 0.2 08/03/2016 1458   BASOSABS 0.0 08/03/2016 1458   Iron/TIBC/Ferritin/ %Sat    Component Value Date/Time   FERRITIN 142 12/25/2015 0912   Lipid Panel     Component Value Date/Time   CHOL 236 (H) 08/03/2016 1458   TRIG 97 08/03/2016 1458   HDL 61 08/03/2016 1458  CHOLHDL 3.0 04/05/2015 0937   VLDL 21 04/05/2015 0937   LDLCALC 156 (H) 08/03/2016 1458   Hepatic Function Panel     Component Value Date/Time   PROT 7.0 08/03/2016 1458   ALBUMIN 4.6 08/03/2016 1458   AST 26 08/03/2016 1458   ALT 33 (H) 08/03/2016 1458   ALKPHOS 58 08/03/2016 1458   BILITOT 0.7 08/03/2016 1458   BILIDIR 0.1 12/25/2015 0912   IBILI 0.5 12/25/2015 0912      Component Value Date/Time   TSH 1.470 08/03/2016 1458   TSH 2.09 03/26/2016 1600   TSH 3.10 12/25/2015 0912    ASSESSMENT AND PLAN: Vitamin D deficiency - Plan: Comprehensive metabolic panel, VITAMIN D 25 Hydroxy (Vit-D Deficiency, Fractures)  Other hyperlipidemia - Plan: Hemoglobin A1c, Insulin, random, Lipid Panel With LDL/HDL Ratio  Class 1 obesity without serious comorbidity with body mass index (BMI) of 33.0 to 33.9 in adult, unspecified obesity type  PLAN:  Vitamin D Deficiency Marley was informed that low vitamin D levels contributes to fatigue and are associated with obesity, breast, and colon cancer. She agrees to continue to take prescription Vit D @50 ,000 IU every week, we will refill for 1 month and will follow up for routine testing of vitamin D, at least 2-3 times per year. She was informed of the risk of over-replacement of vitamin D and agrees to not increase her dose unless he discusses this with [place] first. Rumor agrees to follow up with our clinic in 2 weeks.  Hyperlipidemia Jilliann was informed of the American Heart  Association Guidelines emphasizing intensive lifestyle modifications as the first line treatment for hyperlipidemia. We discussed many lifestyle modifications today in depth, and Anwita will continue to work on decreasing saturated fats such as fatty red meat, butter and many fried foods. She will also increase vegetables and lean protein in her diet and continue to work on exercise and weight loss efforts. We will check labs and Candelaria agrees to follow up with our clinic in 2 weeks.  Elevated Liver Function Test Will check labs and follow. 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 As such, her goal is to continue with weight loss efforts She has agreed to change to follow the Category 3 plan Deloise has been instructed to work up to a goal of 150 minutes of combined cardio and strengthening exercise per week for weight loss and overall health benefits. We discussed the following Behavioral Modification Strategies today: increasing lean protein intake, dealing with family or coworker sabotage and ways to avoid night time snacking We discussed ways to get husband to decrease temptations and offer her better support.  Otila has agreed to follow up with our clinic in 2 weeks. She was informed of the importance of frequent follow up visits to maximize her success with intensive lifestyle modifications for her multiple health conditions.  I, Doreene Nest, am acting as scribe for Dennard Nip, MD  I have reviewed the above documentation for accuracy and completeness, and I agree with the above. -Dennard Nip, MD   OBESITY BEHAVIORAL INTERVENTION VISIT  Today's visit was # 9 out of 22.  Starting weight: 237 Starting date: 08/03/16 Todays weight : 220 Total lbs lost to date: 41 (Patients must lose 7 lbs in the first 6 months to continue with counseling)   ASK: We discussed the diagnosis of obesity with Marianne Sofia today and Eniyah agreed to give  [place] permission to discuss  obesity behavioral modification therapy today.  ASSESS: Oletta has the diagnosis of obesity and her BMI today is 33.5 Ijanae is in the action stage of change   ADVISE: Malayzia was educated on the multiple health risks of obesity as well as the benefit of weight loss to improve her health. She was advised of the need for long term treatment and the importance of lifestyle modifications.  AGREE: Multiple dietary modification options and treatment options were discussed and  Baljit agreed to follow the Category 3 plan We discussed the following Behavioral Modification Stratagies today: increasing lean protein intake, dealing with family or coworker sabotage and ways to avoid night time snacking

## 2016-12-02 LAB — COMPREHENSIVE METABOLIC PANEL
A/G RATIO: 1.7 (ref 1.2–2.2)
ALK PHOS: 53 IU/L (ref 39–117)
ALT: 18 IU/L (ref 0–32)
AST: 17 IU/L (ref 0–40)
Albumin: 4.1 g/dL (ref 3.5–5.5)
BILIRUBIN TOTAL: 0.4 mg/dL (ref 0.0–1.2)
BUN/Creatinine Ratio: 22 (ref 9–23)
BUN: 13 mg/dL (ref 6–24)
CHLORIDE: 100 mmol/L (ref 96–106)
CO2: 25 mmol/L (ref 18–29)
Calcium: 8.9 mg/dL (ref 8.7–10.2)
Creatinine, Ser: 0.59 mg/dL (ref 0.57–1.00)
GFR calc Af Amer: 118 mL/min/{1.73_m2} (ref >59)
GFR, EST NON AFRICAN AMERICAN: 103 mL/min/{1.73_m2} (ref >59)
GLOBULIN, TOTAL: 2.4 g/dL (ref 1.5–4.5)
Glucose: 85 mg/dL (ref 65–99)
POTASSIUM: 4.1 mmol/L (ref 3.5–5.2)
SODIUM: 139 mmol/L (ref 134–144)
Total Protein: 6.5 g/dL (ref 6.0–8.5)

## 2016-12-02 LAB — INSULIN, RANDOM: INSULIN: 5.4 u[IU]/mL (ref 2.6–24.9)

## 2016-12-02 LAB — VITAMIN D 25 HYDROXY (VIT D DEFICIENCY, FRACTURES): VIT D 25 HYDROXY: 38.8 ng/mL (ref 30.0–100.0)

## 2016-12-02 LAB — LIPID PANEL WITH LDL/HDL RATIO
Cholesterol, Total: 226 mg/dL — ABNORMAL HIGH (ref 100–199)
HDL: 53 mg/dL (ref >39)
LDL Calculated: 152 mg/dL — ABNORMAL HIGH (ref 0–99)
LDL/HDL RATIO: 2.9 ratio (ref 0.0–3.2)
Triglycerides: 103 mg/dL (ref 0–149)
VLDL Cholesterol Cal: 21 mg/dL (ref 5–40)

## 2016-12-02 LAB — HEMOGLOBIN A1C
ESTIMATED AVERAGE GLUCOSE: 100 mg/dL
HEMOGLOBIN A1C: 5.1 % (ref 4.8–5.6)

## 2016-12-02 MED FILL — NABUMETONE 500 MG TABLET: 500 | 30 days supply | Qty: 60 | Fill #0

## 2016-12-02 MED FILL — VIT D2 1.25 MG (50,000 UNIT: 1.25 MG | 28 days supply | Qty: 4 | Fill #0

## 2016-12-09 ENCOUNTER — Encounter: Payer: Self-pay | Admitting: Family Medicine

## 2016-12-11 MED ORDER — VILAZODONE HCL 10 & 20 & 40 MG PO KIT: PACK | ORAL | 0 refills | Status: DC

## 2016-12-11 MED FILL — VIIBRYD 10-20 MG STARTER PA: 10 & 20 | 30 days supply | Qty: 30 | Fill #0

## 2016-12-11 NOTE — Addendum Note (Signed)
Addended by: Beatrice Lecher D on: 12/11/2016 07:56 AM   Modules accepted: Orders

## 2016-12-15 ENCOUNTER — Telehealth: Payer: Self-pay

## 2016-12-15 ENCOUNTER — Ambulatory Visit (INDEPENDENT_AMBULATORY_CARE_PROVIDER_SITE_OTHER): Payer: 59 | Admitting: Family Medicine

## 2016-12-15 VITALS — BP 114/75 | HR 66 | Temp 97.6°F | Ht 68.0 in | Wt 218.0 lb

## 2016-12-15 DIAGNOSIS — F3289 Other specified depressive episodes: Secondary | ICD-10-CM | POA: Diagnosis not present

## 2016-12-15 DIAGNOSIS — E669 Obesity, unspecified: Secondary | ICD-10-CM | POA: Diagnosis not present

## 2016-12-15 DIAGNOSIS — Z6833 Body mass index (BMI) 33.0-33.9, adult: Secondary | ICD-10-CM

## 2016-12-15 NOTE — Telephone Encounter (Signed)
[facility] called and reports the Viibryd starter pack only comes in 1-7 day 10 mg and 8-30 day 20 mg. They will fill for this starter pack but will need a new prescription for the 40 mg. Please advise.

## 2016-12-16 MED ORDER — VILAZODONE HCL 40 MG PO TABS: 40.0000 mg | ORAL_TABLET | Freq: Every day | ORAL | 1 refills | Status: DC

## 2016-12-16 NOTE — Telephone Encounter (Signed)
OK, sounds good. Please see a new rx for 40mg  once a day to start after she completed the starter pack

## 2016-12-16 NOTE — Progress Notes (Signed)
Office: [phone]  /  Fax: [phone]   HPI:   Chief Complaint: OBESITY Kathy Clark is here to discuss her progress with her obesity treatment plan. She is on the  follow the Category 3 plan and is following her eating plan approximately 80 % of the time. She states she is walking 10 to 15 minutes 5 to 6 times per week. Kathy Clark continues to do well with weight loss. She is working on [employer] protein and vegetables. 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 She has started exercising. Her weight is 218 lb (98.9 kg) today and has had a weight loss of 2 pounds over a period of 2 weeks since her last visit. She has lost 19 lbs since starting treatment with [place].  Depression with emotional eating behaviors Kathy Clark's mood is lower due to family issues. She is tearful in office discussing her marriage and frustrations. She has done well dealing with emotional eating and limiting how much comfort eating she is doing. She is tapering off prozac to start viibryd. Kathy Clark struggles with emotional eating and using food for comfort to the extent that it is negatively impacting her health. She often snacks when she is not hungry. Kathy Clark sometimes feels she is out of control and then feels guilty that she made poor food choices. She has been working on behavior modification techniques to help reduce her emotional eating and has been somewhat successful. She shows no sign of suicidal or homicidal ideations.  Depression screen [facility] 2/9 11/16/2016 08/03/2016 01/21/2016 08/26/2015 07/11/2015  Decreased Interest 2 3 0 1 1  Down, Depressed, Hopeless 2 3 0 1 2  PHQ - 2 Score 4 6 0 2 3  Altered sleeping '3 3 2 2 2  '$ Tired, decreased energy '3 3 2 2 2  '$ Change in appetite '1 3 1 '$ 0 2  Feeling bad or failure about yourself  1 3 0 0 1  Trouble concentrating '2 2 1 '$ 0 1  Moving slowly or fidgety/restless 1 0 0 0 0  Suicidal thoughts 0 0 0 0 0  PHQ-9 Score '15 20 6 6 11  '$ Difficult doing work/chores - -  Somewhat difficult Somewhat difficult Somewhat difficult     ALLERGIES: Allergies  Allergen Reactions   Succinylcholine Anaphylaxis   Dilaudid [Hydromorphone Hcl] Nausea And Vomiting    MEDICATIONS: Current Outpatient Prescriptions on File Prior to Visit  Medication Sig Dispense Refill   estradiol (VIVELLE-DOT) 0.075 MG/24HR   3   FLUoxetine (PROZAC) 40 MG capsule Take 1 capsule (40 mg total) by mouth daily. (Patient taking differently: Take 10 mg by mouth daily. ) 90 capsule 3   gabapentin (NEURONTIN) 300 MG capsule Take 300 mg by mouth 2 (two) times daily.      hydrochlorothiazide (MICROZIDE) 12.5 MG capsule Take 1 capsule (12.5 mg total) by mouth daily. 90 capsule 1   losartan (COZAAR) 25 MG tablet Take 1 tablet (25 mg total) by mouth daily. 90 tablet 3   Melatonin 3 MG TABS Take by mouth as directed.     Multiple Vitamins-Minerals (MULTIVITAMIN ADULT PO) Take by mouth daily.     nabumetone (RELAFEN) 500 MG tablet TAKE 1 TABLET (500 MG TOTAL) BY MOUTH 2 (TWO) TIMES DAILY. 60 tablet 1   olopatadine (PATANOL) 0.1 % ophthalmic solution      omeprazole (PRILOSEC) 40 MG capsule Take 1 capsule (40 mg total) by mouth daily. 30 capsule 2   SUMAtriptan (IMITREX) 20 MG/ACT nasal spray PLACE  1 SPRAY INTO THE NOSE EVERY 2 HOURS AS NEEDED FOR. HEADACHE 6 Inhaler 2   Vilazodone HCl (VIIBRYD) 10 & 20 & 40 MG KIT Follow instructions. 1 kit 0   Vitamin D, Ergocalciferol, (DRISDOL) 50000 units CAPS capsule Take 1 capsule (50,000 Units total) by mouth every 7 (seven) days. 4 capsule 0   [DISCONTINUED] rivaroxaban (XARELTO) 10 MG TABS tablet Take 1 tablet (10 mg total) by mouth daily with breakfast. 18 tablet 0   No current facility-administered medications on file prior to visit.     PAST MEDICAL HISTORY: Past Medical History:  Diagnosis Date   Anxiety    Back pain    Complication of anesthesia    allergy to Succinylcholine   Depression    Essential hypertension, benign      Gastritis    GERD (gastroesophageal reflux disease)    Hypertension    Joint pain    Lymphocytic colitis    microscopic   Migraines    Osteoarthritis    Sciatica    right leg   Swelling    feet, left foot    PAST SURGICAL HISTORY: Past Surgical History:  Procedure Laterality Date   ABDOMINAL HYSTERECTOMY  07-2005   [place] Bunionectomy Left 09/18/2016   Left Foot   BACK SURGERY     KNEE SURGERY  2006   left; multiple   microdiscetomy  2005   SHOULDER SURGERY  07-22-09   right   SKIN CANCER EXCISION     eyelid   TOTAL KNEE ARTHROPLASTY  07/27/2011   Procedure: TOTAL KNEE ARTHROPLASTY;  Surgeon: Gearlean Alf;  Location: [facility];  Service: Orthopedics;  Laterality: Left;    SOCIAL HISTORY: Social History  Substance Use Topics   Smoking status: Never Smoker   Smokeless tobacco: Never Used   Alcohol use Yes     Comment: occasionally    FAMILY HISTORY: Family History  Problem Relation Age of Onset   Breast cancer Unknown    Tongue cancer Mother    Breast cancer Mother    Anxiety disorder Mother    Depression Mother    Heart disease Father    Hypertension Father    Hyperlipidemia Father    Stroke Paternal Grandmother    Colon cancer Neg Hx     ROS: Review of Systems  Constitutional: Positive for weight loss.  Psychiatric/Behavioral: Positive for depression. Negative for suicidal ideas.    PHYSICAL EXAM: Blood pressure 114/75, pulse 66, temperature 97.6 F (36.4 C), temperature source Oral, height '5\' 8"'$  (1.727 m), weight 218 lb (98.9 kg), SpO2 98 %. Body mass index is 33.15 kg/m. Physical Exam  Constitutional: She is oriented to person, place, and time. She appears well-developed and well-nourished.  Cardiovascular: Normal rate.   Pulmonary/Chest: Effort normal.  Musculoskeletal: Normal range of motion.  Neurological: She is oriented to person, place, and time.  Skin: Skin is warm and dry.  Psychiatric: She has a  normal mood and affect. Her behavior is normal.  Vitals reviewed.   RECENT LABS AND TESTS: BMET    Component Value Date/Time   NA 139 12/01/2016 0838   K 4.1 12/01/2016 0838   CL 100 12/01/2016 0838   CO2 25 12/01/2016 0838   GLUCOSE 85 12/01/2016 0838   GLUCOSE 79 03/26/2016 1600   BUN 13 12/01/2016 0838   CREATININE 0.59 12/01/2016 0838   CREATININE 0.61 03/26/2016 1600   CALCIUM 8.9 12/01/2016 0838   GFRNONAA 103 12/01/2016 0838   GFRNONAA >89 03/26/2016  1600   GFRAA 118 12/01/2016 0838   GFRAA >89 03/26/2016 1600   Lab Results  Component Value Date   HGBA1C 5.1 12/01/2016   HGBA1C 4.9 08/03/2016   Lab Results  Component Value Date   INSULIN 5.4 12/01/2016   INSULIN 5.9 08/03/2016   CBC    Component Value Date/Time   WBC 6.4 08/03/2016 1458   WBC 6.7 03/26/2016 1600   RBC 4.56 08/03/2016 1458   RBC 4.68 03/26/2016 1600   HGB 14.3 03/26/2016 1600   HCT 41.3 08/03/2016 1458   PLT 287 03/26/2016 1600   MCV 91 08/03/2016 1458   MCH 29.8 08/03/2016 1458   MCH 30.6 03/26/2016 1600   MCHC 32.9 08/03/2016 1458   MCHC 33.6 03/26/2016 1600   RDW 12.9 08/03/2016 1458   LYMPHSABS 1.8 08/03/2016 1458   MONOABS 469 03/26/2016 1600   EOSABS 0.2 08/03/2016 1458   BASOSABS 0.0 08/03/2016 1458   Iron/TIBC/Ferritin/ %Sat    Component Value Date/Time   FERRITIN 142 12/25/2015 0912   Lipid Panel     Component Value Date/Time   CHOL 226 (H) 12/01/2016 0838   TRIG 103 12/01/2016 0838   HDL 53 12/01/2016 0838   CHOLHDL 3.0 04/05/2015 0937   VLDL 21 04/05/2015 0937   LDLCALC 152 (H) 12/01/2016 0838   Hepatic Function Panel     Component Value Date/Time   PROT 6.5 12/01/2016 0838   ALBUMIN 4.1 12/01/2016 0838   AST 17 12/01/2016 0838   ALT 18 12/01/2016 0838   ALKPHOS 53 12/01/2016 0838   BILITOT 0.4 12/01/2016 0838   BILIDIR 0.1 12/25/2015 0912   IBILI 0.5 12/25/2015 0912      Component Value Date/Time   TSH 1.470 08/03/2016 1458   TSH 2.09 03/26/2016  1600   TSH 3.10 12/25/2015 0912    ASSESSMENT AND PLAN: Other depression  Class 1 obesity without serious comorbidity with body mass index (BMI) of 33.0 to 33.9 in adult, unspecified obesity type  PLAN:  Depression with Emotional Eating Behaviors We discussed behavior modification techniques today to help [place] deal with her emotional eating and depression. Annalise was advised to overlap viibryd with her 10 mg of prozac for the next few days, then discontinue prozac. Richardine agreed to this plan and to call if her mood worsens. Xoie agrees to follow up with our clinic in 2 weeks.  We spent > than 50% of the 30 minute visit on the counseling as documented in the note.   Obesity Atha is currently in the action stage of change. As such, her goal is to continue with weight loss efforts She has agreed to keep a food journal with 400 to 550 calories and 35 grams of protein at supper and follow the Category 3 plan Armida has been instructed to work up to a goal of 150 minutes of combined cardio and strengthening exercise per week for weight loss and overall health benefits. We discussed the following Behavioral Modification Strategies today: decrease eating out, dealing with family or coworker sabotage and emotional eating strategies  Peni has agreed to follow up with our clinic in 2 weeks. She was informed of the importance of frequent follow up visits to maximize her success with intensive lifestyle modifications for her multiple health conditions.  I, Doreene Nest, am acting as scribe for Dennard Nip, MD  I have reviewed the above documentation for accuracy and completeness, and I agree with the above. -Dennard Nip, MD  OBESITY BEHAVIORAL INTERVENTION VISIT  Today's visit was # 10 out of 22.  Starting weight: 237 lbs Starting date: 08/03/16 Today's weight : 218 lbs Today's date: 12/15/2016 Total lbs lost to date: 39 (Patients must lose 7 lbs in the first 6 months to continue with  counseling)   ASK: We discussed the diagnosis of obesity with Kathy Clark today and Kathy Clark agreed to give [place] permission to discuss obesity behavioral modification therapy today.  ASSESS: Kathy Clark has the diagnosis of obesity and her BMI today is 33.2 Kathy Clark is in the action stage of change   ADVISE: Kathy Clark was educated on the multiple health risks of obesity as well as the benefit of weight loss to improve her health. She was advised of the need for long term treatment and the importance of lifestyle modifications.  AGREE: Multiple dietary modification options and treatment options were discussed and  Kathy Clark agreed to keep a food journal with 400 to 550 calories and 35 grams of protein at supper and follow the Category 3 plan We discussed the following Behavioral Modification Strategies today: decrease eating out, dealing with family or coworker sabotage and emotional eating strategies

## 2016-12-16 NOTE — Telephone Encounter (Signed)
40 mg of Viibryd sent to pharmacy.

## 2016-12-21 MED FILL — ESTRADIOL 0.075 MG PATCH: 0.075 | 84 days supply | Qty: 24 | Fill #3

## 2016-12-22 ENCOUNTER — Telehealth: Payer: 59 | Admitting: Nurse Practitioner

## 2016-12-22 DIAGNOSIS — R112 Nausea with vomiting, unspecified: Secondary | ICD-10-CM

## 2016-12-22 MED ORDER — ONDANSETRON HCL 4 MG PO TABS: 4.0000 mg | ORAL_TABLET | Freq: Three times a day (TID) | ORAL | 0 refills | Status: DC | PRN

## 2016-12-22 MED FILL — ONDANSETRON HCL 4 MG TABLET: 4 | 7 days supply | Qty: 20 | Fill #0

## 2016-12-22 NOTE — Progress Notes (Signed)

## 2016-12-29 ENCOUNTER — Ambulatory Visit (INDEPENDENT_AMBULATORY_CARE_PROVIDER_SITE_OTHER): Payer: 59 | Admitting: Family Medicine

## 2016-12-29 VITALS — BP 103/72 | HR 69 | Temp 98.4°F | Ht 68.0 in | Wt 220.0 lb

## 2016-12-29 DIAGNOSIS — Z6833 Body mass index (BMI) 33.0-33.9, adult: Secondary | ICD-10-CM

## 2016-12-29 DIAGNOSIS — E559 Vitamin D deficiency, unspecified: Secondary | ICD-10-CM | POA: Diagnosis not present

## 2016-12-29 DIAGNOSIS — E669 Obesity, unspecified: Secondary | ICD-10-CM | POA: Diagnosis not present

## 2016-12-29 DIAGNOSIS — Z9189 Other specified personal risk factors, not elsewhere classified: Secondary | ICD-10-CM

## 2016-12-29 MED ORDER — VITAMIN D (ERGOCALCIFEROL) 1.25 MG (50000 UNIT) PO CAPS: 50000.0000 [IU] | ORAL_CAPSULE | ORAL | 0 refills | Status: DC

## 2016-12-29 MED FILL — VIT D2 1.25 MG (50,000 UNIT: 1.25 MG | 28 days supply | Qty: 4 | Fill #0

## 2016-12-29 NOTE — Progress Notes (Signed)
Office: [phone]  /  Fax: [phone]   HPI:   Chief Complaint: OBESITY Kathy Clark is here to discuss her progress with her obesity treatment plan. She is on the  keep a food journal with 400 to 550 calories and 35 grams of protein at supper and follow the Category 3 plan and is following her eating plan approximately 50 % of the time. She states she is walking 10 to 30 minutes 4 to 5 times per week. Kathy Clark has been off her routine in the last 2 weeks. 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 She has also slacked on grocery shopping and doing more "grab and go" eating. Her weight is 220 lb (99.8 kg) today and has had a weight gain of 2 pounds over a period of 2 weeks since her last visit. She has lost 17 lbs since starting treatment with [place].  Vitamin D deficiency Kathy Clark has a diagnosis of vitamin D deficiency. She is currently stable on vit D, not yet at goal and denies nausea, vomiting or muscle weakness.  At risk for diabetes Kathy Clark is at higher than average risk for developing diabetes due to her obesity. She currently denies polyuria or polydipsia.  ALLERGIES: Allergies  Allergen Reactions  . Succinylcholine Anaphylaxis  . Dilaudid [Hydromorphone Hcl] Nausea And Vomiting    MEDICATIONS: Current Outpatient Prescriptions on File Prior to Visit  Medication Sig Dispense Refill  . estradiol (VIVELLE-DOT) 0.075 MG/24HR   3  . gabapentin (NEURONTIN) 300 MG capsule Take 300 mg by mouth 2 (two) times daily.     . hydrochlorothiazide (MICROZIDE) 12.5 MG capsule Take 1 capsule (12.5 mg total) by mouth daily. 90 capsule 1  . losartan (COZAAR) 25 MG tablet Take 1 tablet (25 mg total) by mouth daily. 90 tablet 3  . Melatonin 3 MG TABS Take by mouth as directed.    . Multiple Vitamins-Minerals (MULTIVITAMIN ADULT PO) Take by mouth daily.    . nabumetone (RELAFEN) 500 MG tablet TAKE 1 TABLET (500 MG TOTAL) BY MOUTH 2 (TWO) TIMES DAILY.  60 tablet 1  . olopatadine (PATANOL) 0.1 % ophthalmic solution     . omeprazole (PRILOSEC) 40 MG capsule Take 1 capsule (40 mg total) by mouth daily. 30 capsule 2  . ondansetron (ZOFRAN) 4 MG tablet Take 1 tablet (4 mg total) by mouth every 8 (eight) hours as needed for nausea or vomiting. 20 tablet 0  . SUMAtriptan (IMITREX) 20 MG/ACT nasal spray PLACE 1 SPRAY INTO THE NOSE EVERY 2 HOURS AS NEEDED FOR. HEADACHE 6 Inhaler 2  . Vilazodone HCl (VIIBRYD) 40 MG TABS Take 1 tablet (40 mg total) by mouth daily. 30 tablet 1  . [DISCONTINUED] rivaroxaban (XARELTO) 10 MG TABS tablet Take 1 tablet (10 mg total) by mouth daily with breakfast. 18 tablet 0   No current facility-administered medications on file prior to visit.     PAST MEDICAL HISTORY: Past Medical History:  Diagnosis Date  . Anxiety   . Back pain   . 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 Depression   . Essential hypertension, benign   . Gastritis   . GERD (gastroesophageal reflux disease)   . Hypertension   . Joint pain   . Lymphocytic colitis    microscopic  . Migraines   . Osteoarthritis   . Sciatica    right leg  . Swelling    feet, left  foot    PAST SURGICAL HISTORY: Past Surgical History:  Procedure Laterality Date  . ABDOMINAL HYSTERECTOMY  07-2005  . [place] Bunionectomy Left 09/18/2016   Left Foot  . BACK SURGERY    . KNEE SURGERY  2006   left; multiple  . microdiscetomy  2005  . SHOULDER SURGERY  07-22-09   right  . SKIN CANCER EXCISION     eyelid  . TOTAL KNEE ARTHROPLASTY  07/27/2011   Procedure: TOTAL KNEE ARTHROPLASTY;  Surgeon: Gearlean Alf;  Location: [facility];  Service: Orthopedics;  Laterality: Left;    SOCIAL HISTORY: Social History  Substance Use Topics  . Smoking status: Never Smoker  . Smokeless tobacco: Never Used  . Alcohol use Yes     Comment: occasionally    FAMILY HISTORY: Family History  Problem Relation Age of Onset  . Breast cancer Unknown   .  Tongue cancer Mother   . Breast cancer Mother   . Anxiety disorder Mother   . Depression Mother   . Heart disease Father   . Hypertension Father   . Hyperlipidemia Father   . Stroke Paternal Grandmother   . Colon cancer Neg Hx     ROS: Review of Systems  Constitutional: Negative for weight loss.  Gastrointestinal: Negative for nausea and vomiting.  Genitourinary: Negative for frequency.  Musculoskeletal:       Negative muscle weakness  Endo/Heme/Allergies: Negative for polydipsia.    PHYSICAL EXAM: Blood pressure 103/72, pulse 69, temperature 98.4 F (36.9 C), temperature source Oral, height 5\' 8"  (1.727 m), weight 220 lb (99.8 kg), SpO2 97 %. Body mass index is 33.45 kg/m. Physical Exam  Constitutional: She is oriented to person, place, and time. She appears well-developed and well-nourished.  Cardiovascular: Normal rate.   Pulmonary/Chest: Effort normal.  Musculoskeletal: Normal range of motion.  Neurological: She is oriented to person, place, and time.  Skin: Skin is warm and dry.  Psychiatric: She has a normal mood and affect. Her behavior is normal.  Vitals reviewed.   RECENT LABS AND TESTS: BMET    Component Value Date/Time   NA 139 12/01/2016 0838   K 4.1 12/01/2016 0838   CL 100 12/01/2016 0838   CO2 25 12/01/2016 0838   GLUCOSE 85 12/01/2016 0838   GLUCOSE 79 03/26/2016 1600   BUN 13 12/01/2016 0838   CREATININE 0.59 12/01/2016 0838   CREATININE 0.61 03/26/2016 1600   CALCIUM 8.9 12/01/2016 0838   GFRNONAA 103 12/01/2016 0838   GFRNONAA >89 03/26/2016 1600   GFRAA 118 12/01/2016 0838   GFRAA >89 03/26/2016 1600   Lab Results  Component Value Date   HGBA1C 5.1 12/01/2016   HGBA1C 4.9 08/03/2016   Lab Results  Component Value Date   INSULIN 5.4 12/01/2016   INSULIN 5.9 08/03/2016   CBC    Component Value Date/Time   WBC 6.4 08/03/2016 1458   WBC 6.7 03/26/2016 1600   RBC 4.56 08/03/2016 1458   RBC 4.68 03/26/2016 1600   HGB 13.6  08/03/2016 1458   HCT 41.3 08/03/2016 1458   PLT 287 03/26/2016 1600   MCV 91 08/03/2016 1458   MCH 29.8 08/03/2016 1458   MCH 30.6 03/26/2016 1600   MCHC 32.9 08/03/2016 1458   MCHC 33.6 03/26/2016 1600   RDW 12.9 08/03/2016 1458   LYMPHSABS 1.8 08/03/2016 1458   MONOABS 469 03/26/2016 1600   EOSABS 0.2 08/03/2016 1458   BASOSABS 0.0 08/03/2016 1458   Iron/TIBC/Ferritin/ %Sat    Component  Value Date/Time   FERRITIN 142 12/25/2015 0912   Lipid Panel     Component Value Date/Time   CHOL 226 (H) 12/01/2016 0838   TRIG 103 12/01/2016 0838   HDL 53 12/01/2016 0838   CHOLHDL 3.0 04/05/2015 0937   VLDL 21 04/05/2015 0937   LDLCALC 152 (H) 12/01/2016 0838   Hepatic Function Panel     Component Value Date/Time   PROT 6.5 12/01/2016 0838   ALBUMIN 4.1 12/01/2016 0838   AST 17 12/01/2016 0838   ALT 18 12/01/2016 0838   ALKPHOS 53 12/01/2016 0838   BILITOT 0.4 12/01/2016 0838   BILIDIR 0.1 12/25/2015 0912   IBILI 0.5 12/25/2015 0912      Component Value Date/Time   TSH 1.470 08/03/2016 1458   TSH 2.09 03/26/2016 1600   TSH 3.10 12/25/2015 0912    ASSESSMENT AND PLAN: Vitamin D deficiency - Plan: Vitamin D, Ergocalciferol, (DRISDOL) 50000 units CAPS capsule  At risk for diabetes mellitus  Class 1 obesity without serious comorbidity with body mass index (BMI) of 33.0 to 33.9 in adult, unspecified obesity type  PLAN:  Vitamin D Deficiency Kathy Clark was informed that low vitamin D levels contributes to fatigue and are associated with obesity, breast, and colon cancer. She agrees to continue to take prescription Vit D @50 ,000 IU every week, we will refill for 1 month and will follow up for routine testing of vitamin D, at least 2-3 times per year. She was informed of the risk of over-replacement of vitamin D and agrees to not increase her dose unless he discusses this with [place] first. Kathy Clark agrees to follow up with our clinic in 2 weeks.  Diabetes risk counselling Kathy Clark was  given extended (at least 15 minutes) diabetes prevention counseling today. She is 56 y.o. female and has risk factors for diabetes including obesity. We discussed intensive lifestyle modifications today with an emphasis on weight loss as well as increasing exercise and decreasing simple carbohydrates in her diet.  Obesity Kathy Clark is currently in the action stage of change. As such, her goal is to continue with weight loss efforts She has agreed to keep a food journal with 1100 to 1400 calories and 75+ grams of protein daily Kathy Clark has been instructed to work up to a goal of 150 minutes of combined cardio and strengthening exercise per week for weight loss and overall health benefits. We discussed the following Behavioral Modification Strategies today: increasing lean protein intake, meal planning & cooking strategies, keeping healthy foods in the home and better snacking choices.  Kathy Clark has agreed to follow up with our clinic in 2 weeks. She was informed of the importance of frequent follow up visits to maximize her success with intensive lifestyle modifications for her multiple health conditions.  Kathy Clark, Kathy Clark, am acting as transcriptionist for Dennard Nip, MD  Kathy Clark have reviewed the above documentation for accuracy and completeness, and Kathy Clark agree with the above. -Dennard Nip, MD  OBESITY BEHAVIORAL INTERVENTION VISIT  Today's visit was # 11 out of 22.  Starting weight: 237 lbs Starting date: 08/03/16 Today's weight : 220 lbs Today's date: 12/29/2016 Total lbs lost to date: 17 (Patients must lose 7 lbs in the first 6 months to continue with counseling)   ASK: We discussed the diagnosis of obesity with Kathy Clark today and Kathy Clark agreed to give [place] permission to discuss obesity behavioral modification therapy today.  ASSESS: Kathy Clark has the diagnosis of obesity and her BMI today is 33.5 Kathy Clark is in  the action stage of change   ADVISE: Kathy Clark was educated on the multiple health  risks of obesity as well as the benefit of weight loss to improve her health. She was advised of the need for long term treatment and the importance of lifestyle modifications.  AGREE: Multiple dietary modification options and treatment options were discussed and  Kathy Clark agreed to keep a food journal with 1100 to 1400 calories and 75+ grams of protein daily We discussed the following Behavioral Modification Strategies today: increasing lean protein intake, meal planning & cooking strategies, keeping healthy foods in the home and better snacking choices

## 2017-01-03 ENCOUNTER — Ambulatory Visit (HOSPITAL_BASED_OUTPATIENT_CLINIC_OR_DEPARTMENT_OTHER): Payer: 59 | Attending: Family Medicine | Admitting: Internal Medicine

## 2017-01-03 VITALS — Ht 68.0 in | Wt 215.0 lb

## 2017-01-03 DIAGNOSIS — G4733 Obstructive sleep apnea (adult) (pediatric): Secondary | ICD-10-CM | POA: Diagnosis not present

## 2017-01-03 DIAGNOSIS — R0683 Snoring: Secondary | ICD-10-CM | POA: Insufficient documentation

## 2017-01-04 ENCOUNTER — Encounter: Payer: Self-pay | Admitting: Sports Medicine

## 2017-01-04 ENCOUNTER — Ambulatory Visit (INDEPENDENT_AMBULATORY_CARE_PROVIDER_SITE_OTHER): Payer: 59

## 2017-01-04 ENCOUNTER — Telehealth: Payer: Self-pay | Admitting: Sports Medicine

## 2017-01-04 ENCOUNTER — Ambulatory Visit (INDEPENDENT_AMBULATORY_CARE_PROVIDER_SITE_OTHER): Payer: 59 | Admitting: Sports Medicine

## 2017-01-04 DIAGNOSIS — M25532 Pain in left wrist: Secondary | ICD-10-CM | POA: Insufficient documentation

## 2017-01-04 DIAGNOSIS — G5603 Carpal tunnel syndrome, bilateral upper limbs: Secondary | ICD-10-CM

## 2017-01-04 NOTE — Telephone Encounter (Signed)
You can go ahead and put her in a 430 slot for carpal tunnel injection.

## 2017-01-04 NOTE — Telephone Encounter (Signed)
Patient called request to know if their is anyway that you can give her a wrist injection today towards the end of the day. I adv pt your next avail appointment is Thursday and she said the only day she could realldo was today I adv would send a message. Please Adv [phone]

## 2017-01-04 NOTE — Progress Notes (Signed)
Subjective:    CC: Follow-up  HPI: Carpal tunnel syndrome: Previous injection was 4-1/2 months ago, desires repeat hydrodissection today. Symptoms are moderate, persistent and recurrent.  Left wrist pain: Dorsal wrist pain, worse with passive and active terminal dorsiflexion. Moderate, persistent, localized without radiation.  Past medical history:  Negative.  See flowsheet/record as well for more information.  Surgical history: Negative.  See flowsheet/record as well for more information.  Family history: Negative.  See flowsheet/record as well for more information.  Social history: Negative.  See flowsheet/record as well for more information.  Allergies, and medications have been entered into the medical record, reviewed, and no changes needed.   Review of Systems: No fevers, chills, night sweats, weight loss, chest pain, or shortness of breath.   Objective:    General: Well Developed, well nourished, and in no acute distress.  Neuro: Alert and oriented x3, extra-ocular muscles intact, sensation grossly intact.  HEENT: Normocephalic, atraumatic, pupils equal round reactive to light, neck supple, no masses, no lymphadenopathy, thyroid nonpalpable.  Skin: Warm and dry, no rashes. Cardiac: Regular rate and rhythm, no murmurs rubs or gallops, no lower extremity edema.  Respiratory: Clear to auscultation bilaterally. Not using accessory muscles, speaking in full sentences. Left Wrist: Inspection normal with no visible erythema or swelling. ROM smooth and normal with good flexion and extension and ulnar/radial deviation that is symmetrical with opposite wrist. Tender to palpation dorsally over the radiocarpal joint. No snuffbox tenderness. No tenderness over [place] of [place]. Strength 5/5 in all directions without pain. Negative tinel's and phalens signs. Negative Finkelstein sign. Negative Watson's test.  Procedure: Real-time Ultrasound Guided right median nerve  hydrodissection Device: GE Logiq E  Verbal informed consent obtained.  Time-out conducted.  Noted no overlying erythema, induration, or other signs of local infection.  Skin prepped in a sterile fashion.  Local anesthesia: Topical Ethyl chloride.  With sterile technique and under real time ultrasound guidance:  Using a total of 1 mL kenalog 40, 5 mL lidocaine injected medication both superficial to and deep to the median nerve in the carpal tunnel freeing it from surrounding structures, I then redirected and injected further medication deep around the flexor tendons. Completed without difficulty  Pain immediately resolved suggesting accurate placement of the medication.  Advised to call if fevers/chills, erythema, induration, drainage, or persistent bleeding.  Images permanently stored and available for review in the ultrasound unit.  Impression: Technically successful ultrasound guided injection.  Procedure: Real-time Ultrasound Guided left median nerve hydrodissection Device: GE Logiq E  Verbal informed consent obtained.  Time-out conducted.  Noted no overlying erythema, induration, or other signs of local infection.  Skin prepped in a sterile fashion.  Local anesthesia: Topical Ethyl chloride.  With sterile technique and under real time ultrasound guidance:  Using a total of 1 mL kenalog 40, 5 mL lidocaine injected medication both superficial to and deep to the median nerve in the carpal tunnel freeing it from surrounding structures, I then redirected and injected further medication deep around the flexor tendons. Completed without difficulty  Pain immediately resolved suggesting accurate placement of the medication.  Advised to call if fevers/chills, erythema, induration, drainage, or persistent bleeding.  Images permanently stored and available for review in the ultrasound unit.  Impression: Technically successful ultrasound guided injection.  X-rays personally reviewed, there is  certainly some trapeziometacarpal joint degenerative changes but not much in the radiocarpal joint.  Impression and Recommendations:    Bilateral carpal tunnel syndrome 4.5 month response to previous bilateral  median nerve hydrodissection. Repeat bilateral hydrodissection today.   Left wrist pain Pain dorsally suggestive of degenerative joint disease, baseline x-rays will be obtained. She will probably get some systemic effect from her bilateral median nerve hydrodissection performed today.

## 2017-01-04 NOTE — Assessment & Plan Note (Signed)
Pain dorsally suggestive of degenerative joint disease, baseline x-rays will be obtained. She will probably get some systemic effect from her bilateral median nerve hydrodissection performed today.

## 2017-01-04 NOTE — Telephone Encounter (Signed)
Okay I have called her and advised her asked her to come at 4:15. Thank you for responding back so quickly.

## 2017-01-04 NOTE — Assessment & Plan Note (Signed)
4.5 month response to previous bilateral median nerve hydrodissection. Repeat bilateral hydrodissection today.

## 2017-01-05 DIAGNOSIS — G8929 Other chronic pain: Secondary | ICD-10-CM | POA: Diagnosis not present

## 2017-01-05 DIAGNOSIS — M4726 Other spondylosis with radiculopathy, lumbar region: Secondary | ICD-10-CM | POA: Diagnosis not present

## 2017-01-05 DIAGNOSIS — M5416 Radiculopathy, lumbar region: Secondary | ICD-10-CM | POA: Diagnosis not present

## 2017-01-05 DIAGNOSIS — M545 Low back pain: Secondary | ICD-10-CM | POA: Diagnosis not present

## 2017-01-05 DIAGNOSIS — M5136 Other intervertebral disc degeneration, lumbar region: Secondary | ICD-10-CM | POA: Diagnosis not present

## 2017-01-08 ENCOUNTER — Ambulatory Visit (INDEPENDENT_AMBULATORY_CARE_PROVIDER_SITE_OTHER): Payer: 59 | Admitting: Family Medicine

## 2017-01-08 ENCOUNTER — Encounter: Payer: Self-pay | Admitting: Family Medicine

## 2017-01-08 VITALS — BP 124/79 | HR 69 | Ht 68.0 in | Wt 219.0 lb

## 2017-01-08 DIAGNOSIS — Z6833 Body mass index (BMI) 33.0-33.9, adult: Secondary | ICD-10-CM | POA: Diagnosis not present

## 2017-01-08 DIAGNOSIS — F419 Anxiety disorder, unspecified: Secondary | ICD-10-CM

## 2017-01-08 DIAGNOSIS — F329 Major depressive disorder, single episode, unspecified: Secondary | ICD-10-CM | POA: Diagnosis not present

## 2017-01-08 DIAGNOSIS — F418 Other specified anxiety disorders: Secondary | ICD-10-CM

## 2017-01-08 MED ORDER — VILAZODONE HCL 20 MG PO TABS: 20.0000 mg | ORAL_TABLET | Freq: Every day | ORAL | 0 refills | Status: DC

## 2017-01-08 NOTE — Progress Notes (Addendum)
   Subjective:    Patient ID: Kathy Clark, female    DOB: [birth year], 56 y.o.   MRN: [id number]  HPI F/U depression and anxiety - she is doing well overall. She is now on the Viibryd and started it 10 days. She has been on the 20mg  for about 2 weeks.  No side effects.  No longer seeing a therapist abut says occ she will thik about going back.  She says her husband puts his work first and often fails to keep his family commitments.  Still feeling down several days per weeka dn feeing nervous.  + trouble concentrating. No thoughts of harming herself.   Still seeing Dr. Leafy Ro for weight management.  Doing well.     Review of Systems     Objective:   Physical Exam  Constitutional: She is oriented to person, place, and time. She appears well-developed and well-nourished.  HENT:  Head: Normocephalic and atraumatic.  Cardiovascular: Normal rate, regular rhythm and normal heart sounds.   Pulmonary/Chest: Effort normal and breath sounds normal.  Neurological: She is alert and oriented to person, place, and time.  Skin: Skin is warm and dry.  Psychiatric: She has a normal mood and affect. Her behavior is normal.        Assessment & Plan:  Depression/anxiety - PHQ-9 score of 7 and GAD-7 score of 4 today.  she is wanting to stay at 20mg  daily instead of working up to 40mg .  Will send new Rx. F/U in 2 months.  BMI 33 - working with Bariatric clinic.

## 2017-01-10 DIAGNOSIS — R0683 Snoring: Secondary | ICD-10-CM | POA: Diagnosis not present

## 2017-01-10 NOTE — Procedures (Signed)
  Patient Name: Kathy Clark, Kathy Clark Date: 01/03/2017 Gender: Female D.O.B: 06-Sep-1960 Age (years): 55 Referring Provider: Beatrice Lecher Height (inches): 61 Interpreting Physician: Baird Lyons MD, ABSM Weight (lbs): 215 RPSGT: Baxter Flattery BMI: 33 MRN: [id number] Neck Size: 16.00 CLINICAL INFORMATION Sleep Study Type: NPSG  Indication for sleep study: Excessive Daytime Sleepiness, Fatigue, Hypertension, Obesity, OSA, Snoring, Witnessed Apneas  Epworth Sleepiness Score: 11  SLEEP STUDY TECHNIQUE As per the AASM Manual for the Scoring of Sleep and Associated Events v2.3 (April 2016) with a hypopnea requiring 4% desaturations.  The channels recorded and monitored were frontal, central and occipital EEG, electrooculogram (EOG), submentalis EMG (chin), nasal and oral airflow, thoracic and abdominal wall motion, anterior tibialis EMG, snore microphone, electrocardiogram, and pulse oximetry.  MEDICATIONS Medications self-administered by patient taken the night of the study : GABAPENTIN, MELATONIN  SLEEP ARCHITECTURE The study was initiated at 10:30:11 PM and ended at 5:03:45 AM.  Sleep onset time was 104.2 minutes and the sleep efficiency was 63.0%. The total sleep time was 248.0 minutes.  Stage REM latency was 252.0 minutes.  The patient spent 8.87% of the night in stage N1 sleep, 85.28% in stage N2 sleep, 0.00% in stage N3 and 5.85% in REM.  Alpha intrusion was absent.  Supine sleep was 12.02%.  RESPIRATORY PARAMETERS The overall apnea/hypopnea index (AHI) was 18.4 per hour. There were 65 total apneas, including 63 obstructive, 2 central and 0 mixed apneas. There were 11 hypopneas and 14 RERAs.  The AHI during Stage REM sleep was 4.1 per hour.  AHI while supine was 110.7 per hour.  The mean oxygen saturation was 93.91%. The minimum SpO2 during sleep was 85.00%.  Moderate snoring was noted during this study.  CARDIAC DATA The 2 lead EKG demonstrated sinus  rhythm. The mean heart rate was 60.26 beats per minute. Other EKG findings include: None.  LEG MOVEMENT DATA The total PLMS were 0 with a resulting PLMS index of 0.00. Associated arousal with leg movement index was 0.0 .  IMPRESSIONS - Moderate obstructive sleep apnea occurred during this study (AHI = 18.4/h). - There were insufficient early events to meet protocol requirements for split CPAP titration. - No significant central sleep apnea occurred during this study (CAI = 0.5/h). - Mild oxygen desaturation was noted during this study (Min O2 = 85.00%). - The patient snored with Moderate snoring volume. - No cardiac abnormalities were noted during this study. - Clinically significant periodic limb movements did not occur during sleep. No significant associated arousals.  DIAGNOSIS - Obstructive Sleep Apnea (327.23 [G47.33 ICD-10]  RECOMMENDATIONS - Therapeutic CPAP titration to determine optimal pressure required to alleviate sleep disordered breathing. - Positional therapy avoiding supine position during sleep. - Avoid alcohol, sedatives and other CNS depressants that may worsen sleep apnea and disrupt normal sleep architecture. - Sleep hygiene should be reviewed to assess factors that may improve sleep quality. - Weight management and regular exercise should be initiated or continued if appropriate.  [Electronically signed] 01/10/2017 11:54 AM  Baird Lyons MD, ABSM Diplomate, American Board of Sleep Medicine   NPI: [id number]  [place], American Board of Sleep Medicine  ELECTRONICALLY SIGNED ON:  01/10/2017, 11:51 AM [place] PH: [phone]   FX: [phone] [place]

## 2017-01-14 MED ORDER — AMBULATORY NON FORMULARY MEDICATION: 0 refills | Status: DC

## 2017-01-14 NOTE — Addendum Note (Signed)
Addended by: Beatrice Lecher D on: 01/14/2017 07:37 AM   Modules accepted: Orders

## 2017-01-18 MED FILL — VIIBRYD 20 MG TABLET: 20 | 30 days supply | Qty: 30 | Fill #0

## 2017-01-19 ENCOUNTER — Ambulatory Visit (INDEPENDENT_AMBULATORY_CARE_PROVIDER_SITE_OTHER): Payer: 59 | Admitting: Family Medicine

## 2017-01-19 VITALS — BP 117/76 | HR 65 | Temp 97.9°F | Ht 68.0 in | Wt 216.0 lb

## 2017-01-19 DIAGNOSIS — G4739 Other sleep apnea: Secondary | ICD-10-CM

## 2017-01-19 DIAGNOSIS — E669 Obesity, unspecified: Secondary | ICD-10-CM | POA: Diagnosis not present

## 2017-01-19 DIAGNOSIS — Z9189 Other specified personal risk factors, not elsewhere classified: Secondary | ICD-10-CM

## 2017-01-19 DIAGNOSIS — Z6832 Body mass index (BMI) 32.0-32.9, adult: Secondary | ICD-10-CM

## 2017-01-19 DIAGNOSIS — E559 Vitamin D deficiency, unspecified: Secondary | ICD-10-CM

## 2017-01-19 MED ORDER — VITAMIN D (ERGOCALCIFEROL) 1.25 MG (50000 UNIT) PO CAPS: 50000.0000 [IU] | ORAL_CAPSULE | ORAL | 0 refills | Status: DC

## 2017-01-19 NOTE — Progress Notes (Signed)
Office: [phone]  /  Fax: [phone]   HPI:   Chief Complaint: OBESITY Kathy Clark is here to discuss her progress with her obesity treatment plan. She is on the  keep a food journal with 1100 to 1400 calories and 75+ grams of protein  and is following her eating plan approximately 50 % of the time. She states she is walking for 10 to 30 minutes 5 times per week. Kathy Clark continues to do well with weight loss. She is walking for exercise and wants to start back to the gym and work on tonight. Her weight is 216 lb (98 kg) today and has had a weight loss of 4 pounds over a period of 3 weeks since her last visit. She has lost 21 lbs since starting treatment with [place].  Vitamin D deficiency Kathy Clark has a diagnosis of vitamin D deficiency. She is currently stable on vit D, not yet at goal. She admits fatigue and denies nausea, vomiting or muscle weakness.  At risk for osteopenia Kathy Clark is at higher risk of osteopenia and osteoporosis due to vitamin D deficiency.   Sleep Apnea Kathy Clark has a new diagnosis of moderate obstructive sleep apnea and has questions about how her weight affects this. She has not yet started her CPAP.   ALLERGIES: Allergies  Allergen Reactions  . Succinylcholine Anaphylaxis  . Dilaudid [Hydromorphone Hcl] Nausea And Vomiting    MEDICATIONS: Current Outpatient Prescriptions on File Prior to Visit  Medication Sig Dispense Refill  . AMBULATORY NON FORMULARY MEDICATION Medication Name: Autopap set to 4-20 cm water pressure. Humidified, with mask tubing, etc. Dx: Moderate OSA with AHI = 18.4. Please fax to [facility] 1 Units 0  . estradiol (VIVELLE-DOT) 0.075 MG/24HR   3  . gabapentin (NEURONTIN) 300 MG capsule Take 300 mg by mouth 2 (two) times daily.     . hydrochlorothiazide (MICROZIDE) 12.5 MG capsule Take 1 capsule (12.5 mg total) by mouth daily. 90 capsule 1  . losartan (COZAAR) 25 MG tablet Take 1 tablet (25 mg total) by mouth daily. 90 tablet 3  . Melatonin 3 MG TABS  Take by mouth as directed.    . Multiple Vitamins-Minerals (MULTIVITAMIN ADULT PO) Take by mouth daily.    . SUMAtriptan (IMITREX) 20 MG/ACT nasal spray PLACE 1 SPRAY INTO THE NOSE EVERY 2 HOURS AS NEEDED FOR. HEADACHE 6 Inhaler 2  . Vilazodone HCl (VIIBRYD) 20 MG TABS Take 1 tablet (20 mg total) by mouth daily. 90 tablet 0  . [DISCONTINUED] rivaroxaban (XARELTO) 10 MG TABS tablet Take 1 tablet (10 mg total) by mouth daily with breakfast. 18 tablet 0   No current facility-administered medications on file prior to visit.     PAST MEDICAL HISTORY: Past Medical History:  Diagnosis Date  . Anxiety   . Back pain   . 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 Depression   . Essential hypertension, benign   . Gastritis   . GERD (gastroesophageal reflux disease)   . Hypertension   . Joint pain   . Lymphocytic colitis    microscopic  . Migraines   . Osteoarthritis   . Sciatica    right leg  . Swelling    feet, left foot    PAST SURGICAL HISTORY: Past Surgical History:  Procedure Laterality Date  . ABDOMINAL HYSTERECTOMY  07-2005  . [place] Bunionectomy Left 09/18/2016   Left Foot  . BACK SURGERY    . KNEE SURGERY  2006   left; multiple  .  microdiscetomy  2005  . SHOULDER SURGERY  07-22-09   right  . SKIN CANCER EXCISION     eyelid  . TOTAL KNEE ARTHROPLASTY  07/27/2011   Procedure: TOTAL KNEE ARTHROPLASTY;  Surgeon: Gearlean Alf;  Location: [facility];  Service: Orthopedics;  Laterality: Left;    SOCIAL HISTORY: Social History  Substance Use Topics  . Smoking status: Never Smoker  . Smokeless tobacco: Never Used  . Alcohol use Yes     Comment: occasionally    FAMILY HISTORY: Family History  Problem Relation Age of Onset  . Breast cancer Unknown   . Tongue cancer Mother   . Breast cancer Mother   . Anxiety disorder Mother   . Depression Mother   . Heart disease Father   . Hypertension Father   . Hyperlipidemia Father   . Stroke Paternal  Grandmother   . Colon cancer Neg Hx     ROS: Review of Systems  Constitutional: Positive for malaise/fatigue and weight loss.  Gastrointestinal: Negative for nausea and vomiting.  Musculoskeletal:       Negative muscle weakness    PHYSICAL EXAM: Blood pressure 117/76, pulse 65, temperature 97.9 F (36.6 C), temperature source Oral, height 5\' 8"  (1.727 m), weight 216 lb (98 kg), SpO2 95 %. Body mass index is 32.84 kg/m. Physical Exam  Constitutional: She is oriented to person, place, and time. She appears well-developed and well-nourished.  Cardiovascular: Normal rate.   Pulmonary/Chest: Effort normal.  Musculoskeletal: Normal range of motion.  Neurological: She is oriented to person, place, and time.  Skin: Skin is warm and dry.  Psychiatric: She has a normal mood and affect. Her behavior is normal.  Vitals reviewed.   RECENT LABS AND TESTS: BMET    Component Value Date/Time   NA 139 12/01/2016 0838   K 4.1 12/01/2016 0838   CL 100 12/01/2016 0838   CO2 25 12/01/2016 0838   GLUCOSE 85 12/01/2016 0838   GLUCOSE 79 03/26/2016 1600   BUN 13 12/01/2016 0838   CREATININE 0.59 12/01/2016 0838   CREATININE 0.61 03/26/2016 1600   CALCIUM 8.9 12/01/2016 0838   GFRNONAA 103 12/01/2016 0838   GFRNONAA >89 03/26/2016 1600   GFRAA 118 12/01/2016 0838   GFRAA >89 03/26/2016 1600   Lab Results  Component Value Date   HGBA1C 5.1 12/01/2016   HGBA1C 4.9 08/03/2016   Lab Results  Component Value Date   INSULIN 5.4 12/01/2016   INSULIN 5.9 08/03/2016   CBC    Component Value Date/Time   WBC 6.4 08/03/2016 1458   WBC 6.7 03/26/2016 1600   RBC 4.56 08/03/2016 1458   RBC 4.68 03/26/2016 1600   HGB 13.6 08/03/2016 1458   HCT 41.3 08/03/2016 1458   PLT 287 03/26/2016 1600   MCV 91 08/03/2016 1458   MCH 29.8 08/03/2016 1458   MCH 30.6 03/26/2016 1600   MCHC 32.9 08/03/2016 1458   MCHC 33.6 03/26/2016 1600   RDW 12.9 08/03/2016 1458   LYMPHSABS 1.8 08/03/2016 1458    MONOABS 469 03/26/2016 1600   EOSABS 0.2 08/03/2016 1458   BASOSABS 0.0 08/03/2016 1458   Iron/TIBC/Ferritin/ %Sat    Component Value Date/Time   FERRITIN 142 12/25/2015 0912   Lipid Panel     Component Value Date/Time   CHOL 226 (H) 12/01/2016 0838   TRIG 103 12/01/2016 0838   HDL 53 12/01/2016 0838   CHOLHDL 3.0 04/05/2015 0937   VLDL 21 04/05/2015 0937   LDLCALC 152 (H) 12/01/2016  0838   Hepatic Function Panel     Component Value Date/Time   PROT 6.5 12/01/2016 0838   ALBUMIN 4.1 12/01/2016 0838   AST 17 12/01/2016 0838   ALT 18 12/01/2016 0838   ALKPHOS 53 12/01/2016 0838   BILITOT 0.4 12/01/2016 0838   BILIDIR 0.1 12/25/2015 0912   IBILI 0.5 12/25/2015 0912      Component Value Date/Time   TSH 1.470 08/03/2016 1458   TSH 2.09 03/26/2016 1600   TSH 3.10 12/25/2015 0912    ASSESSMENT AND PLAN: Vitamin D deficiency - Plan: Vitamin D, Ergocalciferol, (DRISDOL) 50000 units CAPS capsule  Other sleep apnea  At risk for osteoporosis  Class 1 obesity with serious comorbidity and body mass index (BMI) of 32.0 to 32.9 in adult, unspecified obesity type  PLAN:  Vitamin D Deficiency Raymie was informed that low vitamin D levels contributes to fatigue and are associated with obesity, breast, and colon cancer. She agrees to continue to take prescription Vit D @50 ,000 IU every week, we will refill for 1 month and will follow up for routine testing of vitamin D, at least 2-3 times per year. She was informed of the risk of over-replacement of vitamin D and agrees to not increase her dose unless he discusses this with [place] first. Kathy Clark agrees to follow up with our clinic in 2 to 3 weeks.  At risk for osteopenia Kathy Clark is at risk for osteopenia and osteoporsis due to her vitamin D deficiency. She was encouraged to take her vitamin D and follow her higher calcium diet and increase strengthening exercise to help strengthen her bones and decrease her risk of osteopenia and  osteoporosis.  Sleep Apnea Kathy Clark advised weight loss often improves obstructive sleep apnea but encouraged Kathy Clark to wear CPAP every night. We discussed strategies to tolerate mask and CPAP. Kathy Clark agrees to follow up with our clinic in 2 to 3 weeks.  Obesity Kathy Clark is currently in the action stage of change. As such, her goal is to continue with weight loss efforts She has agreed to keep a food journal with 1100 to 1400 calories and 75+ grams of protein  Kathy Clark has been instructed to work up to a goal of 150 minutes of combined cardio and strengthening exercise per week for weight loss and overall health benefits. We discussed the following Behavioral Modification Strategies today: increasing lean protein intake and decrease eating out  Kathy Clark has agreed to follow up with our clinic in 2 to 3 weeks. She was informed of the importance of frequent follow up visits to maximize her success with intensive lifestyle modifications for her multiple health conditions.  I, Doreene Nest, am acting as transcriptionist for Dennard Nip, MD  I have reviewed the above documentation for accuracy and completeness, and I agree with the above. -Dennard Nip, MD   OBESITY BEHAVIORAL INTERVENTION VISIT  Today's visit was # 12 out of 22.  Starting weight: 237 lbs Starting date: 08/03/16 Today's weight : 216 lbs Today's date: 01/19/2017 Total lbs lost to date: 21 (Patients must lose 7 lbs in the first 6 months to continue with counseling)   ASK: We discussed the diagnosis of obesity with Kathy Clark today and Kathy Clark agreed to give [place] permission to discuss obesity behavioral modification therapy today.  ASSESS: Nadiyah has the diagnosis of obesity and her BMI today is 32.9 Jazlynne is in the action stage of change   ADVISE: Kathy Clark was educated on the multiple health risks of obesity as well as  the benefit of weight loss to improve her health. She was advised of the need for long term treatment and the  importance of lifestyle modifications.  AGREE: Multiple dietary modification options and treatment options were discussed and  Kathy Clark agreed to keep a food journal with 1100 to 1400 calories and 75+ grams of protein  We discussed the following Behavioral Modification Strategies today: increasing lean protein intake and decrease eating out

## 2017-01-21 DIAGNOSIS — Z96659 Presence of unspecified artificial knee joint: Secondary | ICD-10-CM | POA: Diagnosis not present

## 2017-01-21 DIAGNOSIS — G4733 Obstructive sleep apnea (adult) (pediatric): Secondary | ICD-10-CM | POA: Diagnosis not present

## 2017-02-02 ENCOUNTER — Ambulatory Visit (INDEPENDENT_AMBULATORY_CARE_PROVIDER_SITE_OTHER): Payer: 59 | Admitting: Family Medicine

## 2017-02-02 VITALS — BP 122/77 | HR 62 | Temp 98.0°F | Ht 68.0 in | Wt 216.0 lb

## 2017-02-02 DIAGNOSIS — E669 Obesity, unspecified: Secondary | ICD-10-CM | POA: Diagnosis not present

## 2017-02-02 DIAGNOSIS — G4733 Obstructive sleep apnea (adult) (pediatric): Secondary | ICD-10-CM

## 2017-02-02 DIAGNOSIS — Z6832 Body mass index (BMI) 32.0-32.9, adult: Secondary | ICD-10-CM

## 2017-02-03 MED FILL — VIT D2 1.25 MG (50,000 UNIT: 1.25 MG | 28 days supply | Qty: 4 | Fill #0

## 2017-02-03 NOTE — Progress Notes (Signed)
Office: [phone]  /  Fax: [phone]   HPI:   Chief Complaint: OBESITY Kathy Clark is here to discuss her progress with her obesity treatment plan. She is on the  keep a food journal with 1100 to 1400 calories and 75+ grams of protein  and is following her eating plan approximately 90 % of the time. She states she is walking 10 minutes 5 times per week Monday through Friday and 30 minutes on the weekends. Kathy Clark maintained weight and has kept a good journal and has increased her protein intake. Her weight is 216 lb (98 kg) today and has maintained weight over a period of 2 weeks since her last visit. She has lost 21 lbs since starting treatment with [place].  Sleep Apnea Kathy Clark started CPAP and feels that sleep is much improved.  ALLERGIES: Allergies  Allergen Reactions  . Succinylcholine Anaphylaxis  . Dilaudid [Hydromorphone Hcl] Nausea And Vomiting    MEDICATIONS: Current Outpatient Prescriptions on File Prior to Visit  Medication Sig Dispense Refill  . AMBULATORY NON FORMULARY MEDICATION Medication Name: Autopap set to 4-20 cm water pressure. Humidified, with mask tubing, etc. Dx: Moderate OSA with AHI = 18.4. Please fax to [facility] 1 Units 0  . estradiol (VIVELLE-DOT) 0.075 MG/24HR   3  . gabapentin (NEURONTIN) 300 MG capsule Take 300 mg by mouth 2 (two) times daily.     . hydrochlorothiazide (MICROZIDE) 12.5 MG capsule Take 1 capsule (12.5 mg total) by mouth daily. 90 capsule 1  . losartan (COZAAR) 25 MG tablet Take 1 tablet (25 mg total) by mouth daily. 90 tablet 3  . Melatonin 3 MG TABS Take by mouth as directed.    . Multiple Vitamins-Minerals (MULTIVITAMIN ADULT PO) Take by mouth daily.    . SUMAtriptan (IMITREX) 20 MG/ACT nasal spray PLACE 1 SPRAY INTO THE NOSE EVERY 2 HOURS AS NEEDED FOR. HEADACHE 6 Inhaler 2  . Vilazodone HCl (VIIBRYD) 20 MG TABS Take 1 tablet (20 mg total) by mouth daily. 90 tablet 0  . Vitamin D, Ergocalciferol, (DRISDOL) 50000 units CAPS capsule Take 1  capsule (50,000 Units total) by mouth every 7 (seven) days. 4 capsule 0  . [DISCONTINUED] rivaroxaban (XARELTO) 10 MG TABS tablet Take 1 tablet (10 mg total) by mouth daily with breakfast. 18 tablet 0   No current facility-administered medications on file prior to visit.     PAST MEDICAL HISTORY: Past Medical History:  Diagnosis Date  . Anxiety   . Back pain   . 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 Depression   . Essential hypertension, benign   . Gastritis   . GERD (gastroesophageal reflux disease)   . Hypertension   . Joint pain   . Lymphocytic colitis    microscopic  . Migraines   . Osteoarthritis   . Sciatica    right leg  . Swelling    feet, left foot    PAST SURGICAL HISTORY: Past Surgical History:  Procedure Laterality Date  . ABDOMINAL HYSTERECTOMY  07-2005  . [place] Bunionectomy Left 09/18/2016   Left Foot  . BACK SURGERY    . KNEE SURGERY  2006   left; multiple  . microdiscetomy  2005  . SHOULDER SURGERY  07-22-09   right  . SKIN CANCER EXCISION     eyelid  . TOTAL KNEE ARTHROPLASTY  07/27/2011   Procedure: TOTAL KNEE ARTHROPLASTY;  Surgeon: Gearlean Alf;  Location: [facility];  Service: Orthopedics;  Laterality: Left;  SOCIAL HISTORY: Social History  Substance Use Topics  . Smoking status: Never Smoker  . Smokeless tobacco: Never Used  . Alcohol use Yes     Comment: occasionally    FAMILY HISTORY: Family History  Problem Relation Age of Onset  . Breast cancer Unknown   . Tongue cancer Mother   . Breast cancer Mother   . Anxiety disorder Mother   . Depression Mother   . Heart disease Father   . Hypertension Father   . Hyperlipidemia Father   . Stroke Paternal Grandmother   . Colon cancer Neg Hx     ROS: Review of Systems  Constitutional: Negative for weight loss.    PHYSICAL EXAM: Blood pressure 122/77, pulse 62, temperature 98 F (36.7 C), temperature source Oral, height 5\' 8"  (1.727 m), weight 216 lb (98  kg), SpO2 98 %. Body mass index is 32.84 kg/m. Physical Exam  Constitutional: She is oriented to person, place, and time. She appears well-developed and well-nourished.  Cardiovascular: Normal rate.   Pulmonary/Chest: Effort normal.  Musculoskeletal: Normal range of motion.  Neurological: She is oriented to person, place, and time.  Skin: Skin is warm and dry.  Psychiatric: She has a normal mood and affect. Her behavior is normal.  Vitals reviewed.   RECENT LABS AND TESTS: BMET    Component Value Date/Time   NA 139 12/01/2016 0838   K 4.1 12/01/2016 0838   CL 100 12/01/2016 0838   CO2 25 12/01/2016 0838   GLUCOSE 85 12/01/2016 0838   GLUCOSE 79 03/26/2016 1600   BUN 13 12/01/2016 0838   CREATININE 0.59 12/01/2016 0838   CREATININE 0.61 03/26/2016 1600   CALCIUM 8.9 12/01/2016 0838   GFRNONAA 103 12/01/2016 0838   GFRNONAA >89 03/26/2016 1600   GFRAA 118 12/01/2016 0838   GFRAA >89 03/26/2016 1600   Lab Results  Component Value Date   HGBA1C 5.1 12/01/2016   HGBA1C 4.9 08/03/2016   Lab Results  Component Value Date   INSULIN 5.4 12/01/2016   INSULIN 5.9 08/03/2016   CBC    Component Value Date/Time   WBC 6.4 08/03/2016 1458   WBC 6.7 03/26/2016 1600   RBC 4.56 08/03/2016 1458   RBC 4.68 03/26/2016 1600   HGB 13.6 08/03/2016 1458   HCT 41.3 08/03/2016 1458   PLT 287 03/26/2016 1600   MCV 91 08/03/2016 1458   MCH 29.8 08/03/2016 1458   MCH 30.6 03/26/2016 1600   MCHC 32.9 08/03/2016 1458   MCHC 33.6 03/26/2016 1600   RDW 12.9 08/03/2016 1458   LYMPHSABS 1.8 08/03/2016 1458   MONOABS 469 03/26/2016 1600   EOSABS 0.2 08/03/2016 1458   BASOSABS 0.0 08/03/2016 1458   Iron/TIBC/Ferritin/ %Sat    Component Value Date/Time   FERRITIN 142 12/25/2015 0912   Lipid Panel     Component Value Date/Time   CHOL 226 (H) 12/01/2016 0838   TRIG 103 12/01/2016 0838   HDL 53 12/01/2016 0838   CHOLHDL 3.0 04/05/2015 0937   VLDL 21 04/05/2015 0937   LDLCALC 152 (H)  12/01/2016 0838   Hepatic Function Panel     Component Value Date/Time   PROT 6.5 12/01/2016 0838   ALBUMIN 4.1 12/01/2016 0838   AST 17 12/01/2016 0838   ALT 18 12/01/2016 0838   ALKPHOS 53 12/01/2016 0838   BILITOT 0.4 12/01/2016 0838   BILIDIR 0.1 12/25/2015 0912   IBILI 0.5 12/25/2015 0912      Component Value Date/Time   TSH 1.470  08/03/2016 1458   TSH 2.09 03/26/2016 1600   TSH 3.10 12/25/2015 0912    ASSESSMENT AND PLAN: Obstructive sleep apnea syndrome  Class 1 obesity with serious comorbidity and body mass index (BMI) of 32.0 to 32.9 in adult, unspecified obesity type  PLAN:  Sleep Apnea Kathy Clark agrees to continue using CPAP and will follow up with our clinic in 2 weeks.  We spent > than 50% of the 15 minute visit on the counseling as documented in the note.   Obesity Kathy Clark is currently in the action stage of change. As such, her goal is to continue with weight loss efforts She has agreed to follow a lower carbohydrate, vegetable and lean protein rich diet plan Kathy Clark has been instructed to work up to a goal of 150 minutes of combined cardio and strengthening exercise per week for weight loss and overall health benefits. We discussed the following Behavioral Modification Strategies today: increasing lean protein intake and keeping healthy foods in the home  Kathy Clark has agreed to follow up with our clinic in 2 weeks. She was informed of the importance of frequent follow up visits to maximize her success with intensive lifestyle modifications for her multiple health conditions.  I, Doreene Nest, am acting as transcriptionist for Dennard Nip, MD  I have reviewed the above documentation for accuracy and completeness, and I agree with the above. -Dennard Nip, MD  OBESITY BEHAVIORAL INTERVENTION VISIT  Today's visit was # 13 out of 22.  Starting weight: 237 lbs Starting date: 216 lbs Today's weight : 216 lbs  Today's date: 02/02/2017 Total lbs lost to date:  21 (Patients must lose 7 lbs in the first 6 months to continue with counseling)   ASK: We discussed the diagnosis of obesity with Kathy Clark today and Kaari agreed to give [place] permission to discuss obesity behavioral modification therapy today.  ASSESS: Kathy Clark has the diagnosis of obesity and her BMI today is 32.9 Kathy Clark is in the action stage of change   ADVISE: Kathy Clark was educated on the multiple health risks of obesity as well as the benefit of weight loss to improve her health. She was advised of the need for long term treatment and the importance of lifestyle modifications.  AGREE: Multiple dietary modification options and treatment options were discussed and  Kathy Clark agreed to follow a lower carbohydrate, vegetable and lean protein rich diet plan We discussed the following Behavioral Modification Strategies today: increasing lean protein intake and keeping healthy foods in the home

## 2017-02-08 ENCOUNTER — Encounter (INDEPENDENT_AMBULATORY_CARE_PROVIDER_SITE_OTHER): Payer: Self-pay | Admitting: Family Medicine

## 2017-02-11 DIAGNOSIS — G4733 Obstructive sleep apnea (adult) (pediatric): Secondary | ICD-10-CM | POA: Diagnosis not present

## 2017-02-11 DIAGNOSIS — Z96659 Presence of unspecified artificial knee joint: Secondary | ICD-10-CM | POA: Diagnosis not present

## 2017-02-15 MED FILL — HYDROCHLOROTHIAZIDE 12.5 MG: 12.5 | 90 days supply | Qty: 90 | Fill #1

## 2017-02-15 MED FILL — GABAPENTIN 300 MG CAPS: 300 | 90 days supply | Qty: 180 | Fill #1

## 2017-02-15 MED FILL — VIIBRYD 20 MG TABLET: 20 | 30 days supply | Qty: 30 | Fill #1

## 2017-02-15 MED FILL — LOSARTAN POTASSIUM 25 MG TA: 25 | 90 days supply | Qty: 90 | Fill #1

## 2017-02-16 ENCOUNTER — Ambulatory Visit (INDEPENDENT_AMBULATORY_CARE_PROVIDER_SITE_OTHER): Payer: 59 | Admitting: Physician Assistant

## 2017-02-16 VITALS — BP 108/71 | HR 59 | Temp 97.6°F | Ht 68.0 in | Wt 213.0 lb

## 2017-02-16 DIAGNOSIS — E669 Obesity, unspecified: Secondary | ICD-10-CM | POA: Insufficient documentation

## 2017-02-16 DIAGNOSIS — E559 Vitamin D deficiency, unspecified: Secondary | ICD-10-CM | POA: Diagnosis not present

## 2017-02-16 DIAGNOSIS — Z9189 Other specified personal risk factors, not elsewhere classified: Secondary | ICD-10-CM

## 2017-02-16 DIAGNOSIS — I10 Essential (primary) hypertension: Secondary | ICD-10-CM | POA: Diagnosis not present

## 2017-02-16 DIAGNOSIS — Z6832 Body mass index (BMI) 32.0-32.9, adult: Secondary | ICD-10-CM | POA: Diagnosis not present

## 2017-02-16 MED ORDER — VITAMIN D (ERGOCALCIFEROL) 1.25 MG (50000 UNIT) PO CAPS: 50000.0000 [IU] | ORAL_CAPSULE | ORAL | 0 refills | Status: DC

## 2017-02-16 NOTE — Progress Notes (Signed)
Office: [phone]  /  Fax: [phone]   HPI:   Chief Complaint: OBESITY Kathy Clark is here to discuss her progress with her obesity treatment plan. She is on the lower carbohydrate, vegetable and lean protein rich diet plan and is following her eating plan approximately 99 % of the time. She states she is walking for 20 to 30 minutes 5 times per week. Kathy Clark continues to do well with weight loss and she is tolerating the low carbohydrate plan well. Hunger is well controlled. She has one more week on low carbohydrate plan and will then go back to keeping a food journal. Her weight is 213 lb (96.6 kg) today and has had a weight loss of 3 pounds over a period of 2 weeks since her last visit. She has lost 24 lbs since starting treatment with [place].  Vitamin D deficiency Kathy Clark has a diagnosis of vitamin D deficiency. She is currently taking vit D and denies nausea, vomiting or muscle weakness.  Essential Hypertension Kathy Clark is a 56 y.o. female with hypertension.  Kathy Clark denies chest pain or shortness of breath on exertion. She is working weight loss to help control her blood pressure with the goal of decreasing her risk of heart attack and stroke. Kathy Clark blood pressure is currently controlled.  At risk for cardiovascular disease Kathy Clark is at a higher than average risk for cardiovascular disease due to obesity. She currently denies any chest pain.   ALLERGIES: Allergies  Allergen Reactions  . Succinylcholine Anaphylaxis  . Dilaudid [Hydromorphone Hcl] Nausea And Vomiting    MEDICATIONS: Current Outpatient Prescriptions on File Prior to Visit  Medication Sig Dispense Refill  . AMBULATORY NON FORMULARY MEDICATION Medication Name: Autopap set to 4-20 cm water pressure. Humidified, with mask tubing, etc. Dx: Moderate OSA with AHI = 18.4. Please fax to [facility] 1 Units 0  . estradiol (VIVELLE-DOT) 0.075 MG/24HR   3  . gabapentin (NEURONTIN) 300 MG capsule Take 300 mg by  mouth 2 (two) times daily.     . hydrochlorothiazide (MICROZIDE) 12.5 MG capsule Take 1 capsule (12.5 mg total) by mouth daily. 90 capsule 1  . losartan (COZAAR) 25 MG tablet Take 1 tablet (25 mg total) by mouth daily. 90 tablet 3  . Melatonin 3 MG TABS Take by mouth as directed.    . Multiple Vitamins-Minerals (MULTIVITAMIN ADULT PO) Take by mouth daily.    . SUMAtriptan (IMITREX) 20 MG/ACT nasal spray PLACE 1 SPRAY INTO THE NOSE EVERY 2 HOURS AS NEEDED FOR. HEADACHE 6 Inhaler 2  . Vilazodone HCl (VIIBRYD) 20 MG TABS Take 1 tablet (20 mg total) by mouth daily. 90 tablet 0  . Vitamin D, Ergocalciferol, (DRISDOL) 50000 units CAPS capsule Take 1 capsule (50,000 Units total) by mouth every 7 (seven) days. 4 capsule 0  . [DISCONTINUED] rivaroxaban (XARELTO) 10 MG TABS tablet Take 1 tablet (10 mg total) by mouth daily with breakfast. 18 tablet 0   No current facility-administered medications on file prior to visit.     PAST MEDICAL HISTORY: Past Medical History:  Diagnosis Date  . Anxiety   . Back pain   . 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 Depression   . Essential hypertension, benign   . Gastritis   . GERD (gastroesophageal reflux disease)   . Hypertension   . Joint pain   . Lymphocytic colitis    microscopic  . Migraines   . Osteoarthritis   . Sciatica  right leg  . Swelling    feet, left foot    PAST SURGICAL HISTORY: Past Surgical History:  Procedure Laterality Date  . ABDOMINAL HYSTERECTOMY  07-2005  . [place] Bunionectomy Left 09/18/2016   Left Foot  . BACK SURGERY    . KNEE SURGERY  2006   left; multiple  . microdiscetomy  2005  . SHOULDER SURGERY  07-22-09   right  . SKIN CANCER EXCISION     eyelid  . TOTAL KNEE ARTHROPLASTY  07/27/2011   Procedure: TOTAL KNEE ARTHROPLASTY;  Surgeon: Gearlean Alf;  Location: [facility];  Service: Orthopedics;  Laterality: Left;    SOCIAL HISTORY: Social History  Substance Use Topics  . Smoking  status: Never Smoker  . Smokeless tobacco: Never Used  . Alcohol use Yes     Comment: occasionally    FAMILY HISTORY: Family History  Problem Relation Age of Onset  . Breast cancer Unknown   . Tongue cancer Mother   . Breast cancer Mother   . Anxiety disorder Mother   . Depression Mother   . Heart disease Father   . Hypertension Father   . Hyperlipidemia Father   . Stroke Paternal Grandmother   . Colon cancer Neg Hx     ROS: Review of Systems  Constitutional: Positive for weight loss.  Respiratory: Negative for shortness of breath (on exertion).   Cardiovascular: Negative for chest pain.  Gastrointestinal: Negative for nausea and vomiting.  Musculoskeletal:       Negative muscle weakness    PHYSICAL EXAM: Blood pressure 108/71, pulse (!) 59, temperature 97.6 F (36.4 C), temperature source Oral, height 5\' 8"  (1.727 m), weight 213 lb (96.6 kg), SpO2 99 %. Body mass index is 32.39 kg/m. Physical Exam  Constitutional: She is oriented to person, place, and time. She appears well-developed and well-nourished.  Cardiovascular:  bradycardic  Pulmonary/Chest: Effort normal.  Musculoskeletal: Normal range of motion.  Neurological: She is oriented to person, place, and time.  Skin: Skin is warm and dry.  Psychiatric: She has a normal mood and affect. Her behavior is normal.  Vitals reviewed.   RECENT LABS AND TESTS: BMET    Component Value Date/Time   NA 139 12/01/2016 0838   K 4.1 12/01/2016 0838   CL 100 12/01/2016 0838   CO2 25 12/01/2016 0838   GLUCOSE 85 12/01/2016 0838   GLUCOSE 79 03/26/2016 1600   BUN 13 12/01/2016 0838   CREATININE 0.59 12/01/2016 0838   CREATININE 0.61 03/26/2016 1600   CALCIUM 8.9 12/01/2016 0838   GFRNONAA 103 12/01/2016 0838   GFRNONAA >89 03/26/2016 1600   GFRAA 118 12/01/2016 0838   GFRAA >89 03/26/2016 1600   Lab Results  Component Value Date   HGBA1C 5.1 12/01/2016   HGBA1C 4.9 08/03/2016   Lab Results  Component Value  Date   INSULIN 5.4 12/01/2016   INSULIN 5.9 08/03/2016   CBC    Component Value Date/Time   WBC 6.4 08/03/2016 1458   WBC 6.7 03/26/2016 1600   RBC 4.56 08/03/2016 1458   RBC 4.68 03/26/2016 1600   HGB 13.6 08/03/2016 1458   HCT 41.3 08/03/2016 1458   PLT 287 03/26/2016 1600   MCV 91 08/03/2016 1458   MCH 29.8 08/03/2016 1458   MCH 30.6 03/26/2016 1600   MCHC 32.9 08/03/2016 1458   MCHC 33.6 03/26/2016 1600   RDW 12.9 08/03/2016 1458   LYMPHSABS 1.8 08/03/2016 1458   MONOABS 469 03/26/2016 1600   EOSABS 0.2  08/03/2016 1458   BASOSABS 0.0 08/03/2016 1458   Iron/TIBC/Ferritin/ %Sat    Component Value Date/Time   FERRITIN 142 12/25/2015 0912   Lipid Panel     Component Value Date/Time   CHOL 226 (H) 12/01/2016 0838   TRIG 103 12/01/2016 0838   HDL 53 12/01/2016 0838   CHOLHDL 3.0 04/05/2015 0937   VLDL 21 04/05/2015 0937   LDLCALC 152 (H) 12/01/2016 0838   Hepatic Function Panel     Component Value Date/Time   PROT 6.5 12/01/2016 0838   ALBUMIN 4.1 12/01/2016 0838   AST 17 12/01/2016 0838   ALT 18 12/01/2016 0838   ALKPHOS 53 12/01/2016 0838   BILITOT 0.4 12/01/2016 0838   BILIDIR 0.1 12/25/2015 0912   IBILI 0.5 12/25/2015 0912      Component Value Date/Time   TSH 1.470 08/03/2016 1458   TSH 2.09 03/26/2016 1600   TSH 3.10 12/25/2015 0912    ASSESSMENT AND PLAN: Vitamin D deficiency - Plan: Vitamin D, Ergocalciferol, (DRISDOL) 50000 units CAPS capsule  Essential hypertension  At risk for heart disease  Class 1 obesity with serious comorbidity and body mass index (BMI) of 32.0 to 32.9 in adult, unspecified obesity type  PLAN:  Vitamin D Deficiency Kathy Clark was informed that low vitamin D levels contributes to fatigue and are associated with obesity, breast, and colon cancer. She agrees to continue to take prescription Vit D @50 ,000 IU every week, we will refill for 1 month and will follow up for routine testing of vitamin D, at least 2-3 times per year.  She was informed of the risk of over-replacement of vitamin D and agrees to not increase her dose unless he discusses this with [place] first. Kathy Clark agrees to follow up with our clinic in 2 weeks.  Essential Hypertension We discussed sodium restriction, working on healthy weight loss, and a regular exercise program as the means to achieve improved blood pressure control. Kathy Clark agreed with this plan and agreed to follow up as directed. We will continue to monitor her blood pressure as well as her progress with the above lifestyle modifications. She will continue her medications as prescribed and will watch for signs of hypotension as she continues her lifestyle modifications.  Cardiovascular risk counselling Kathy Clark was given extended (15 minutes) coronary artery disease prevention counseling today. She is 56 y.o. female and has risk factors for heart disease including obesity and hypertension. We discussed intensive lifestyle modifications today with an emphasis on specific weight loss instructions and strategies. Pt was also informed of the importance of increasing exercise and decreasing saturated fats to help prevent heart disease.  Obesity Kathy Clark is currently in the action stage of change. As such, her goal is to continue with weight loss efforts She has agreed to keep a food journal with 1400 to 1500 calories and 90+ grams of protein daily and follow the Category 3 plan Kathy Clark has been instructed to work up to a goal of 150 minutes of combined cardio and strengthening exercise per week for weight loss and overall health benefits. We discussed the following Behavioral Modification Strategies today: increasing lean protein intake and planning for success  Kathy Clark has agreed to follow up with our clinic in 2 weeks. She was informed of the importance of frequent follow up visits to maximize her success with intensive lifestyle modifications for her multiple health conditions.  I, Doreene Nest, am acting as  transcriptionist for Lacy Duverney, PA-C  I have reviewed the above documentation for accuracy and  completeness, and I agree with the above. -Lacy Duverney, PA-C  I have reviewed the above note and agree with the plan. -Dennard Nip, MD   OBESITY BEHAVIORAL INTERVENTION VISIT  Today's visit was # 14 out of 22.  Starting weight: 237 lbs Starting date: 08/03/16 Today's weight : 213 lbs Today's date: 02/16/2017 Total lbs lost to date: 24 (Patients must lose 7 lbs in the first 6 months to continue with counseling)   ASK: We discussed the diagnosis of obesity with Kathy Clark today and Kathy Clark agreed to give [place] permission to discuss obesity behavioral modification therapy today.  ASSESS: Kathy Clark has the diagnosis of obesity and her BMI today is 32.5 Kathy Clark is in the action stage of change   ADVISE: Kathy Clark was educated on the multiple health risks of obesity as well as the benefit of weight loss to improve her health. She was advised of the need for long term treatment and the importance of lifestyle modifications.  AGREE: Multiple dietary modification options and treatment options were discussed and  Jaliya agreed to keep a food journal with 1400 to 1500 calories and 90+ grams of protein daily and follow the Category 3 plan We discussed the following Behavioral Modification Strategies today: increasing lean protein intake and planning for success

## 2017-02-21 DIAGNOSIS — Z96659 Presence of unspecified artificial knee joint: Secondary | ICD-10-CM | POA: Diagnosis not present

## 2017-02-21 DIAGNOSIS — G4733 Obstructive sleep apnea (adult) (pediatric): Secondary | ICD-10-CM | POA: Diagnosis not present

## 2017-02-25 ENCOUNTER — Telehealth: Payer: Self-pay | Admitting: Family Medicine

## 2017-02-25 ENCOUNTER — Encounter: Payer: Self-pay | Admitting: Family Medicine

## 2017-02-25 DIAGNOSIS — G4733 Obstructive sleep apnea (adult) (pediatric): Secondary | ICD-10-CM | POA: Insufficient documentation

## 2017-02-25 NOTE — Telephone Encounter (Signed)
Call pt: CPAP download shows she is currently set on CPAP auto setting. Her CPAP will need to be set to a pressure of around 12. Not sure if Dr. Leafy Ro is managing this or if we are. Please let her know the happy to call [facility] if needed if Dr. Leafy Ro is not the one taking care of this. After she sat on her new pressure then she will need to have another download about 2 weeks later.

## 2017-02-26 NOTE — Telephone Encounter (Signed)
lvm informing pt of recommendations advised her to rtn call.Kathy Clark

## 2017-03-02 ENCOUNTER — Encounter: Payer: Self-pay | Admitting: Family Medicine

## 2017-03-03 NOTE — Telephone Encounter (Signed)
Pt has an appt 8/23 will discuss recommendations at this time.Kathy Clark [place]

## 2017-03-04 ENCOUNTER — Encounter: Payer: Self-pay | Admitting: Family Medicine

## 2017-03-04 ENCOUNTER — Ambulatory Visit (INDEPENDENT_AMBULATORY_CARE_PROVIDER_SITE_OTHER): Payer: 59 | Admitting: Family Medicine

## 2017-03-04 VITALS — BP 122/77 | HR 68 | Wt 220.0 lb

## 2017-03-04 DIAGNOSIS — L80 Vitiligo: Secondary | ICD-10-CM | POA: Diagnosis not present

## 2017-03-04 DIAGNOSIS — G4733 Obstructive sleep apnea (adult) (pediatric): Secondary | ICD-10-CM | POA: Diagnosis not present

## 2017-03-04 DIAGNOSIS — D239 Other benign neoplasm of skin, unspecified: Secondary | ICD-10-CM

## 2017-03-04 DIAGNOSIS — R0683 Snoring: Secondary | ICD-10-CM | POA: Diagnosis not present

## 2017-03-04 MED ORDER — AMBULATORY NON FORMULARY MEDICATION: 0 refills | Status: DC

## 2017-03-04 NOTE — Progress Notes (Signed)
   Subjective:    Patient ID: Kathy Clark, female    DOB: 08/10/1960, 56 y.o.   MRN: [id number]  HPI   F/U OSA - here today to follow-up for OSA.  She has been wearing the CPAP as consistently as possible. She's even getting up to 7 hours per night. She's finally found a mask that fits well with only issue is that sometimes by about the early morning around 4 AM she is actually starting to get air leaks she's not had any problem dealing with the actual pressure in the mask and is no longer getting a sore throat like she did with the naal pillows. She is on her third mask. But she notices that she is sleeping better and emotionally feeling better. She is wondering if the lack of sleep was actually causing some of her depressive type symptoms.  He also has some little white spots on her arms in particular. They're not bothersome but wants to know what they are. She also has a lesion on her left upper back. She saw dermatology a few years ago and they told her has scar Tissue. She says the lesion has been there for about 10 years. She doesn't think it's changed recently.   Review of Systems     Objective:   Physical Exam  Constitutional: She is oriented to person, place, and time. She appears well-developed and well-nourished.  HENT:  Head: Normocephalic and atraumatic.  Eyes: Conjunctivae and EOM are normal.  Cardiovascular: Normal rate.   Pulmonary/Chest: Effort normal.  Neurological: She is alert and oriented to person, place, and time.  Skin: Skin is dry. No pallor.  She has some small 1-3 mm white spots on the forearms. Come look like hypopigmentation and some look like they could be early seborrheic keratoses. The lesion on her left upper back to the shoulder blade is more nodular and raised approximate centimeter in size is consistent with a dermatofibroma. It does blanch slightly when squeezed.  Psychiatric: She has a normal mood and affect. Her behavior is normal.  Vitals  reviewed.         Assessment & Plan:  OSA - Doing well. Wearing CPAP nightly for > 4 hours.  Has a better mask now.  Will change setting to 12 cm water pressure. F/U in 2 months. Call sooner if having hard time adjusting.  Will fax order to [facility].   Vitiligo- Gave reassurance  Dermatofibroma.  Gave reassurance. Take phot and monitor for changes.

## 2017-03-08 ENCOUNTER — Other Ambulatory Visit (INDEPENDENT_AMBULATORY_CARE_PROVIDER_SITE_OTHER): Payer: Self-pay | Admitting: Family Medicine

## 2017-03-08 DIAGNOSIS — E559 Vitamin D deficiency, unspecified: Secondary | ICD-10-CM

## 2017-03-08 MED FILL — VIT D2 1.25 MG (50,000 UNIT: 1.25 MG | 28 days supply | Qty: 4 | Fill #0

## 2017-03-10 ENCOUNTER — Ambulatory Visit (INDEPENDENT_AMBULATORY_CARE_PROVIDER_SITE_OTHER): Payer: 59 | Admitting: Physician Assistant

## 2017-03-10 VITALS — BP 123/85 | HR 63 | Temp 97.9°F | Ht 68.0 in | Wt 218.0 lb

## 2017-03-10 DIAGNOSIS — Z9189 Other specified personal risk factors, not elsewhere classified: Secondary | ICD-10-CM

## 2017-03-10 DIAGNOSIS — E559 Vitamin D deficiency, unspecified: Secondary | ICD-10-CM

## 2017-03-10 DIAGNOSIS — E784 Other hyperlipidemia: Secondary | ICD-10-CM

## 2017-03-10 DIAGNOSIS — I10 Essential (primary) hypertension: Secondary | ICD-10-CM | POA: Diagnosis not present

## 2017-03-10 DIAGNOSIS — Z6833 Body mass index (BMI) 33.0-33.9, adult: Secondary | ICD-10-CM | POA: Diagnosis not present

## 2017-03-10 DIAGNOSIS — E669 Obesity, unspecified: Secondary | ICD-10-CM

## 2017-03-10 DIAGNOSIS — E7849 Other hyperlipidemia: Secondary | ICD-10-CM

## 2017-03-10 MED ORDER — VITAMIN D (ERGOCALCIFEROL) 1.25 MG (50000 UNIT) PO CAPS: 50000.0000 [IU] | ORAL_CAPSULE | ORAL | 0 refills | Status: DC

## 2017-03-10 NOTE — Progress Notes (Signed)
Office: [phone]  /  Fax: [phone]   HPI:   Chief Complaint: OBESITY Kathy Clark is here to discuss her progress with her obesity treatment plan. She is on the keep a food journal with 1400 to 1500 calories and 90+ grams of protein daily and is following her eating plan approximately 80 % of the time. She states she is exercising 0 minutes 0 times per week. Kathy Clark was on vacation and found it harder to keep a food journal. She is ready to get back on track and would like a structured meal plan. Her weight is 218 lb (98.9 kg) today and has had a weight gain of 5 pounds over a period of 3 weeks since her last visit. She has lost 19 lbs since starting treatment with [place].  Vitamin D deficiency Kathy Clark has a diagnosis of vitamin D deficiency. She is currently taking vit D and denies nausea, vomiting or muscle weakness.  Hypertension Kathy Clark is a 56 y.o. female with hypertension. Kathy Clark denies chest pain or shortness of breath on exertion. She is working weight loss to help control her blood pressure with the goal of decreasing her risk of heart attack and stroke. Kathy Clark blood pressure is currently stable.  Hyperlipidemia Kathy Clark has hyperlipidemia and has been trying to improve her cholesterol levels with intensive lifestyle modification including a low saturated fat diet, exercise and weight loss. She has a strong family history of heart disease. Kathy Clark states she will await test results before initiation of statin. She denies any chest pain, claudication or myalgias.  At risk for cardiovascular disease Kathy Clark is at a higher than average risk for cardiovascular disease due to obesity, hypertension and hyperlipidemia. She currently denies any chest pain.   ALLERGIES: Allergies  Allergen Reactions  . Succinylcholine Anaphylaxis  . Dilaudid [Hydromorphone Hcl] Nausea And Vomiting    MEDICATIONS: Current Outpatient Prescriptions on File Prior to Visit  Medication Sig  Dispense Refill  . AMBULATORY NON FORMULARY MEDICATION Medication Name: Cpap set to 12 cm water pressure. Humidified, with mask tubing, etc. Dx: Moderate OSA with AHI = 18.4. Please fax to [facility] Please send download on 03/22/2016 1 Units 0  . estradiol (VIVELLE-DOT) 0.075 MG/24HR   3  . gabapentin (NEURONTIN) 300 MG capsule Take 300 mg by mouth 2 (two) times daily.     . hydrochlorothiazide (MICROZIDE) 12.5 MG capsule Take 1 capsule (12.5 mg total) by mouth daily. 90 capsule 1  . losartan (COZAAR) 25 MG tablet Take 1 tablet (25 mg total) by mouth daily. 90 tablet 3  . Melatonin 3 MG TABS Take by mouth as directed.    . Multiple Vitamins-Minerals (MULTIVITAMIN ADULT PO) Take by mouth daily.    . SUMAtriptan (IMITREX) 20 MG/ACT nasal spray PLACE 1 SPRAY INTO THE NOSE EVERY 2 HOURS AS NEEDED FOR. HEADACHE 6 Inhaler 2  . Vilazodone HCl (VIIBRYD) 20 MG TABS Take 1 tablet (20 mg total) by mouth daily. 90 tablet 0  . [DISCONTINUED] rivaroxaban (XARELTO) 10 MG TABS tablet Take 1 tablet (10 mg total) by mouth daily with breakfast. 18 tablet 0   No current facility-administered medications on file prior to visit.     PAST MEDICAL HISTORY: Past Medical History:  Diagnosis Date  . Anxiety   . Back pain   . 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 Depression   . Essential hypertension, benign   . Gastritis   . GERD (gastroesophageal reflux disease)   .  Hypertension   . Joint pain   . Lymphocytic colitis    microscopic  . Migraines   . Osteoarthritis   . Sciatica    right leg  . Swelling    feet, left foot    PAST SURGICAL HISTORY: Past Surgical History:  Procedure Laterality Date  . ABDOMINAL HYSTERECTOMY  07-2005  . [place] Bunionectomy Left 09/18/2016   Left Foot  . BACK SURGERY    . KNEE SURGERY  2006   left; multiple  . microdiscetomy  2005  . SHOULDER SURGERY  07-22-09   right  . SKIN CANCER EXCISION     eyelid  . TOTAL KNEE ARTHROPLASTY  07/27/2011    Procedure: TOTAL KNEE ARTHROPLASTY;  Surgeon: Gearlean Alf;  Location: [facility];  Service: Orthopedics;  Laterality: Left;    SOCIAL HISTORY: Social History  Substance Use Topics  . Smoking status: Never Smoker  . Smokeless tobacco: Never Used  . Alcohol use Yes     Comment: occasionally    FAMILY HISTORY: Family History  Problem Relation Age of Onset  . Breast cancer Unknown   . Tongue cancer Mother   . Breast cancer Mother   . Anxiety disorder Mother   . Depression Mother   . Heart disease Father   . Hypertension Father   . Hyperlipidemia Father   . Stroke Paternal Grandmother   . Colon cancer Neg Hx     ROS: Review of Systems  Constitutional: Negative for weight loss.  Respiratory: Negative for shortness of breath (on exertion).   Cardiovascular: Negative for chest pain and claudication.  Gastrointestinal: Negative for nausea and vomiting.  Musculoskeletal:       Negative muscle weakness    PHYSICAL EXAM: Blood pressure 123/85, pulse 63, temperature 97.9 F (36.6 C), temperature source Oral, height 5\' 8"  (1.727 m), weight 218 lb (98.9 kg), SpO2 99 %. Body mass index is 33.15 kg/m. Physical Exam  Constitutional: She is oriented to person, place, and time. She appears well-developed and well-nourished.  Cardiovascular: Normal rate.   Pulmonary/Chest: Effort normal.  Musculoskeletal: Normal range of motion.  Neurological: She is oriented to person, place, and time.  Skin: Skin is warm and dry.  Psychiatric: She has a normal mood and affect. Her behavior is normal.  Vitals reviewed.   RECENT LABS AND TESTS: BMET    Component Value Date/Time   NA 139 12/01/2016 0838   K 4.1 12/01/2016 0838   CL 100 12/01/2016 0838   CO2 25 12/01/2016 0838   GLUCOSE 85 12/01/2016 0838   GLUCOSE 79 03/26/2016 1600   BUN 13 12/01/2016 0838   CREATININE 0.59 12/01/2016 0838   CREATININE 0.61 03/26/2016 1600   CALCIUM 8.9 12/01/2016 0838   GFRNONAA 103 12/01/2016 0838    GFRNONAA >89 03/26/2016 1600   GFRAA 118 12/01/2016 0838   GFRAA >89 03/26/2016 1600   Lab Results  Component Value Date   HGBA1C 5.1 12/01/2016   HGBA1C 4.9 08/03/2016   Lab Results  Component Value Date   INSULIN 5.4 12/01/2016   INSULIN 5.9 08/03/2016   CBC    Component Value Date/Time   WBC 6.4 08/03/2016 1458   WBC 6.7 03/26/2016 1600   RBC 4.56 08/03/2016 1458   RBC 4.68 03/26/2016 1600   HGB 13.6 08/03/2016 1458   HCT 41.3 08/03/2016 1458   PLT 287 03/26/2016 1600   MCV 91 08/03/2016 1458   MCH 29.8 08/03/2016 1458   MCH 30.6 03/26/2016 1600   MCHC  32.9 08/03/2016 1458   MCHC 33.6 03/26/2016 1600   RDW 12.9 08/03/2016 1458   LYMPHSABS 1.8 08/03/2016 1458   MONOABS 469 03/26/2016 1600   EOSABS 0.2 08/03/2016 1458   BASOSABS 0.0 08/03/2016 1458   Iron/TIBC/Ferritin/ %Sat    Component Value Date/Time   FERRITIN 142 12/25/2015 0912   Lipid Panel     Component Value Date/Time   CHOL 226 (H) 12/01/2016 0838   TRIG 103 12/01/2016 0838   HDL 53 12/01/2016 0838   CHOLHDL 3.0 04/05/2015 0937   VLDL 21 04/05/2015 0937   LDLCALC 152 (H) 12/01/2016 0838   Hepatic Function Panel     Component Value Date/Time   PROT 6.5 12/01/2016 0838   ALBUMIN 4.1 12/01/2016 0838   AST 17 12/01/2016 0838   ALT 18 12/01/2016 0838   ALKPHOS 53 12/01/2016 0838   BILITOT 0.4 12/01/2016 0838   BILIDIR 0.1 12/25/2015 0912   IBILI 0.5 12/25/2015 0912      Component Value Date/Time   TSH 1.470 08/03/2016 1458   TSH 2.09 03/26/2016 1600   TSH 3.10 12/25/2015 0912    ASSESSMENT AND PLAN: Essential hypertension - Plan: Comprehensive metabolic panel  Vitamin D deficiency - Plan: VITAMIN D 25 Hydroxy (Vit-D Deficiency, Fractures), Vitamin D, Ergocalciferol, (DRISDOL) 50000 units CAPS capsule  Other hyperlipidemia - Plan: Lipid Panel With LDL/HDL Ratio  At risk for heart disease  Class 1 obesity with serious comorbidity and body mass index (BMI) of 33.0 to 33.9 in adult,  unspecified obesity type  PLAN:  Vitamin D Deficiency Kathy Clark was informed that low vitamin D levels contributes to fatigue and are associated with obesity, breast, and colon cancer. She agrees to continue to take prescription Vit D @50 ,000 IU every week, we will refill for 1 month and will check labs and will follow up for routine testing of vitamin D, at least 2-3 times per year. She was informed of the risk of over-replacement of vitamin D and agrees to not increase her dose unless he discusses this with [place] first. Kathy Clark agrees to follow up with our clinic in 2 weeks.  Hypertension We discussed sodium restriction, working on healthy weight loss, and a regular exercise program as the means to achieve improved blood pressure control. Kathy Clark agreed with this plan and agreed to follow up as directed. We will check labs and will continue to monitor her blood pressure as well as her progress with the above lifestyle modifications. She will continue her medications as prescribed and will watch for signs of hypotension as she continues her lifestyle modifications.  Hyperlipidemia Kathy Clark was informed of the American Heart Association Guidelines emphasizing intensive lifestyle modifications as the first line treatment for hyperlipidemia. We discussed many lifestyle modifications today in depth, and Kathy Clark will continue to work on decreasing saturated fats such as fatty red meat, butter and many fried foods. She will also increase vegetables and lean protein in her diet and continue to work on exercise and weight loss efforts. We will check labs and Kathy Clark agrees to follow up with our clinic in 2 weeks.  Cardiovascular risk counselling Kathy Clark was given extended (15 minutes) coronary artery disease prevention counseling today. She is 57 y.o. female and has risk factors for heart disease including obesity, hypertension and hyperlipidemia. We discussed intensive lifestyle modifications today with an emphasis on specific  weight loss instructions and strategies. Pt was also informed of the importance of increasing exercise and decreasing saturated fats to help prevent heart disease.  Obesity  Kathy Clark is currently in the action stage of change. As such, her goal is to continue with weight loss efforts She has agreed to follow the Category 3 plan Kathy Clark has been instructed to work up to a goal of 150 minutes of combined cardio and strengthening exercise per week for weight loss and overall health benefits. We discussed the following Behavioral Modification Strategies today: increasing lean protein intake and keeping healthy foods in the home  Kathy Clark has agreed to follow up with our clinic in 2 weeks. She was informed of the importance of frequent follow up visits to maximize her success with intensive lifestyle modifications for her multiple health conditions.  I, Kathy Clark, am acting as transcriptionist for Lacy Duverney, PA-C  I have reviewed the above documentation for accuracy and completeness, and I agree with the above. -Lacy Duverney, PA-C  I have reviewed the above note and agree with the plan. -Dennard Nip, MD   OBESITY BEHAVIORAL INTERVENTION VISIT  Today's visit was # 15 out of 65.  Starting weight: 237 lbs Starting date: 08/03/16 Today's weight : 218 lbs Today's date: 03/10/2017 Total lbs lost to date: 46 (Patients must lose 7 lbs in the first 6 months to continue with counseling)   ASK: We discussed the diagnosis of obesity with Kathy Clark today and Kathy Clark agreed to give [place] permission to discuss obesity behavioral modification therapy today.  ASSESS: Kathy Clark has the diagnosis of obesity and her BMI today is 33.15 Kathy Clark is in the action stage of change   ADVISE: Kathy Clark was educated on the multiple health risks of obesity as well as the benefit of weight loss to improve her health. She was advised of the need for long term treatment and the importance of lifestyle  modifications.  AGREE: Multiple dietary modification options and treatment options were discussed and  Kathy Clark agreed to follow the Category 3 plan We discussed the following Behavioral Modification Strategies today: increasing lean protein intake and keeping healthy foods in the home

## 2017-03-11 ENCOUNTER — Encounter: Payer: Self-pay | Admitting: Family Medicine

## 2017-03-11 ENCOUNTER — Ambulatory Visit: Payer: 59 | Admitting: Family Medicine

## 2017-03-11 DIAGNOSIS — G4733 Obstructive sleep apnea (adult) (pediatric): Secondary | ICD-10-CM

## 2017-03-11 DIAGNOSIS — R0683 Snoring: Secondary | ICD-10-CM

## 2017-03-11 LAB — LIPID PANEL WITH LDL/HDL RATIO
CHOLESTEROL TOTAL: 190 mg/dL (ref 100–199)
HDL: 61 mg/dL (ref >39)
LDL CALC: 112 mg/dL — AB (ref 0–99)
LDl/HDL Ratio: 1.8 ratio (ref 0.0–3.2)
TRIGLYCERIDES: 83 mg/dL (ref 0–149)
VLDL Cholesterol Cal: 17 mg/dL (ref 5–40)

## 2017-03-11 LAB — COMPREHENSIVE METABOLIC PANEL
ALK PHOS: 50 IU/L (ref 39–117)
ALT: 22 IU/L (ref 0–32)
AST: 16 IU/L (ref 0–40)
Albumin/Globulin Ratio: 1.8 (ref 1.2–2.2)
Albumin: 4.2 g/dL (ref 3.5–5.5)
BILIRUBIN TOTAL: 0.3 mg/dL (ref 0.0–1.2)
BUN/Creatinine Ratio: 18 (ref 9–23)
BUN: 14 mg/dL (ref 6–24)
CHLORIDE: 99 mmol/L (ref 96–106)
CO2: 24 mmol/L (ref 20–29)
CREATININE: 0.78 mg/dL (ref 0.57–1.00)
Calcium: 9.7 mg/dL (ref 8.7–10.2)
GFR calc Af Amer: 98 mL/min/{1.73_m2} (ref >59)
GFR calc non Af Amer: 85 mL/min/{1.73_m2} (ref >59)
GLOBULIN, TOTAL: 2.4 g/dL (ref 1.5–4.5)
GLUCOSE: 78 mg/dL (ref 65–99)
Potassium: 4.5 mmol/L (ref 3.5–5.2)
SODIUM: 142 mmol/L (ref 134–144)
Total Protein: 6.6 g/dL (ref 6.0–8.5)

## 2017-03-11 LAB — VITAMIN D 25 HYDROXY (VIT D DEFICIENCY, FRACTURES): Vit D, 25-Hydroxy: 43 ng/mL (ref 30.0–100.0)

## 2017-03-11 MED FILL — VIIBRYD 20 MG TABLET: 20 | 30 days supply | Qty: 30 | Fill #2

## 2017-03-17 ENCOUNTER — Other Ambulatory Visit: Payer: Self-pay

## 2017-03-17 DIAGNOSIS — G4733 Obstructive sleep apnea (adult) (pediatric): Secondary | ICD-10-CM

## 2017-03-17 DIAGNOSIS — R0683 Snoring: Secondary | ICD-10-CM

## 2017-03-17 MED ORDER — AMBULATORY NON FORMULARY MEDICATION: 0 refills | Status: DC

## 2017-03-17 NOTE — Telephone Encounter (Signed)
Patient is calling back about order being sent over to [place] she has left multiple voice mails and sent a mychart message and she hasn't received a call back or a message back. Pt is upset and is requesting to know what is going on and if something can be done today. Please adv. Pt would like a call back to know about order. Thanks

## 2017-03-20 ENCOUNTER — Telehealth: Payer: 59 | Admitting: Family

## 2017-03-20 DIAGNOSIS — B9689 Other specified bacterial agents as the cause of diseases classified elsewhere: Secondary | ICD-10-CM

## 2017-03-20 DIAGNOSIS — J028 Acute pharyngitis due to other specified organisms: Secondary | ICD-10-CM

## 2017-03-20 MED ORDER — BENZONATATE 100 MG PO CAPS: 100.0000 mg | ORAL_CAPSULE | Freq: Three times a day (TID) | ORAL | 0 refills | Status: DC | PRN

## 2017-03-20 MED ORDER — PREDNISONE 5 MG PO TABS: 5.0000 mg | ORAL_TABLET | ORAL | 0 refills | Status: DC

## 2017-03-20 MED ORDER — ALBUTEROL SULFATE HFA 108 (90 BASE) MCG/ACT IN AERS: 2.0000 | INHALATION_SPRAY | RESPIRATORY_TRACT | 2 refills | Status: DC | PRN

## 2017-03-20 MED ORDER — AZITHROMYCIN 250 MG PO TABS: ORAL_TABLET | ORAL | 0 refills | Status: DC

## 2017-03-20 NOTE — Progress Notes (Signed)
Thank you for the details you put in the comment boxes. Those details really help [place] take better care of you. Sorry you're not feeling well. I'm sending an inhaler, pred pack, tessalon, and z pack.  We are sorry that you are not feeling well.  Here is how we plan to help!  Based on your presentation I believe you most likely have A cough due to bacteria.  When patients have a fever and a productive cough with a change in color or increased sputum production, we are concerned about bacterial bronchitis.  If left untreated it can progress to pneumonia.  If your symptoms do not improve with your treatment plan it is important that you contact your provider.   I have prescribed Azithromyin 250 mg: two tables now and then one tablet daily for 4 additonal days    In addition you may use A non-prescription cough medication called Mucinex DM: take 2 tablets every 12 hours. and A prescription cough medication called Tessalon Perles 100mg . You may take 1-2 capsules every 8 hours as needed for your cough.  Sterapred 5 mg dosepak   Albuterol HFA inhaler, take 2 puffs every 4 hours as needed for shortness of breath.   From your responses in the eVisit questionnaire you describe inflammation in the upper respiratory tract which is causing a significant cough.  This is commonly called Bronchitis and has four common causes:    Allergies  Viral Infections  Acid Reflux  Bacterial Infection Allergies, viruses and acid reflux are treated by controlling symptoms or eliminating the cause. An example might be a cough caused by taking certain blood pressure medications. You stop the cough by changing the medication. Another example might be a cough caused by acid reflux. Controlling the reflux helps control the cough.  USE OF BRONCHODILATOR ("RESCUE") INHALERS: There is a risk from using your bronchodilator too frequently.  The risk is that over-reliance on a medication which only relaxes the muscles surrounding the  breathing tubes can reduce the effectiveness of medications prescribed to reduce swelling and congestion of the tubes themselves.  Although you feel brief relief from the bronchodilator inhaler, your asthma may actually be worsening with the tubes becoming more swollen and filled with mucus.  This can delay other crucial treatments, such as oral steroid medications. If you need to use a bronchodilator inhaler daily, several times per day, you should discuss this with your provider.  There are probably better treatments that could be used to keep your asthma under control.     HOME CARE . Only take medications as instructed by your medical team. . Complete the entire course of an antibiotic. . Drink plenty of fluids and get plenty of rest. . Avoid close contacts especially the very young and the elderly . Cover your mouth if you cough or cough into your sleeve. . Always remember to wash your hands . A steam or ultrasonic humidifier can help congestion.   GET HELP RIGHT AWAY IF: . You develop worsening fever. . You become short of breath . You cough up blood. . Your symptoms persist after you have completed your treatment plan MAKE SURE YOU   Understand these instructions.  Will watch your condition.  Will get help right away if you are not doing well or get worse.  Your e-visit answers were reviewed by a board certified advanced clinical practitioner to complete your personal care plan.  Depending on the condition, your plan could have included both over the counter  or prescription medications. If there is a problem please reply  once you have received a response from your provider. Your safety is important to [place].  If you have drug allergies check your prescription carefully.    You can use MyChart to ask questions about today's visit, request a non-urgent call back, or ask for a work or school excuse for 24 hours related to this e-Visit. If it has been greater than 24 hours you will need  to follow up with your provider, or enter a new e-Visit to address those concerns. You will get an e-mail in the next two days asking about your experience.  I hope that your e-visit has been valuable and will speed your recovery. Thank you for using e-visits.

## 2017-03-23 ENCOUNTER — Ambulatory Visit (INDEPENDENT_AMBULATORY_CARE_PROVIDER_SITE_OTHER): Payer: 59 | Admitting: Physician Assistant

## 2017-03-23 ENCOUNTER — Telehealth: Payer: Self-pay | Admitting: Family Medicine

## 2017-03-23 VITALS — BP 112/74 | HR 63 | Temp 97.6°F | Ht 68.0 in | Wt 219.0 lb

## 2017-03-23 DIAGNOSIS — Z6833 Body mass index (BMI) 33.0-33.9, adult: Secondary | ICD-10-CM

## 2017-03-23 DIAGNOSIS — E784 Other hyperlipidemia: Secondary | ICD-10-CM | POA: Diagnosis not present

## 2017-03-23 DIAGNOSIS — E669 Obesity, unspecified: Secondary | ICD-10-CM | POA: Diagnosis not present

## 2017-03-23 DIAGNOSIS — E7849 Other hyperlipidemia: Secondary | ICD-10-CM | POA: Insufficient documentation

## 2017-03-23 NOTE — Progress Notes (Signed)
Office: [phone]  /  Fax: [phone]   HPI:   Chief Complaint: OBESITY Kathy Clark is here to discuss her progress with her obesity treatment plan. Kathy Clark is on the Category 3 plan and is following her eating plan approximately 50 % of the time. Kathy Clark states Kathy Clark is exercising 0 minutes 0 times per week. Kathy Clark has been busier with work and has not planned ahead as well. Kathy Clark has been making smarter food choices and controls her portions.  Her weight is 219 lb (99.3 kg) today and has gained 1 pound since her last visit. Kathy Clark has lost 18 lbs since starting treatment with [place].  Hyperlipidemia Kathy Clark has hyperlipidemia and has been trying to improve her cholesterol levels with intensive lifestyle modification including a low saturated fat diet, exercise and weight loss. Her LDL is at 112 and has improved from 150's. Kathy Clark is not on a statin. Kathy Clark denies any chest pain, claudication or myalgias.  ALLERGIES: Allergies  Allergen Reactions  . Succinylcholine Anaphylaxis  . Dilaudid [Hydromorphone Hcl] Nausea And Vomiting    MEDICATIONS: Current Outpatient Prescriptions on File Prior to Visit  Medication Sig Dispense Refill  . albuterol (PROVENTIL HFA;VENTOLIN HFA) 108 (90 Base) MCG/ACT inhaler Inhale 2 puffs into the lungs every 4 (four) hours as needed for wheezing or shortness of breath. 1 Inhaler 2  . AMBULATORY NON FORMULARY MEDICATION Medication Name: Cpap set to 12 cm water pressure. Humidified, with mask tubing, etc. Dx: Moderate OSA with AHI = 18.4. Please fax to [facility] Please send download on 03/22/2016 1 Units 0  . azithromycin (ZITHROMAX) 250 MG tablet Take 2 tabs now then 1 daily times 4 days. 6 tablet 0  . benzonatate (TESSALON PERLES) 100 MG capsule Take 1-2 capsules (100-200 mg total) by mouth every 8 (eight) hours as needed for cough. 30 capsule 0  . estradiol (VIVELLE-DOT) 0.075 MG/24HR   3  . gabapentin (NEURONTIN) 300 MG capsule Take 300 mg by mouth 2 (two) times daily.       . hydrochlorothiazide (MICROZIDE) 12.5 MG capsule Take 1 capsule (12.5 mg total) by mouth daily. 90 capsule 1  . losartan (COZAAR) 25 MG tablet Take 1 tablet (25 mg total) by mouth daily. 90 tablet 3  . Melatonin 3 MG TABS Take by mouth as directed.    . Multiple Vitamins-Minerals (MULTIVITAMIN ADULT PO) Take by mouth daily.    . predniSONE (DELTASONE) 5 MG tablet Take 1 tablet (5 mg total) by mouth as directed. 6 day standard taper 21 tablet 0  . SUMAtriptan (IMITREX) 20 MG/ACT nasal spray PLACE 1 SPRAY INTO THE NOSE EVERY 2 HOURS AS NEEDED FOR. HEADACHE 6 Inhaler 2  . Vilazodone HCl (VIIBRYD) 20 MG TABS Take 1 tablet (20 mg total) by mouth daily. 90 tablet 0  . Vitamin D, Ergocalciferol, (DRISDOL) 50000 units CAPS capsule Take 1 capsule (50,000 Units total) by mouth every 7 (seven) days. 4 capsule 0  . [DISCONTINUED] rivaroxaban (XARELTO) 10 MG TABS tablet Take 1 tablet (10 mg total) by mouth daily with breakfast. 18 tablet 0   No current facility-administered medications on file prior to visit.     PAST MEDICAL HISTORY: Past Medical History:  Diagnosis Date  . Anxiety   . Back pain   . 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 Depression   . Essential hypertension, benign   . Gastritis   . GERD (gastroesophageal reflux disease)   . Hypertension   . Joint pain   .  Lymphocytic colitis    microscopic  . Migraines   . Osteoarthritis   . Sciatica    right leg  . Swelling    feet, left foot    PAST SURGICAL HISTORY: Past Surgical History:  Procedure Laterality Date  . ABDOMINAL HYSTERECTOMY  07-2005  . [place] Bunionectomy Left 09/18/2016   Left Foot  . BACK SURGERY    . KNEE SURGERY  2006   left; multiple  . microdiscetomy  2005  . SHOULDER SURGERY  07-22-09   right  . SKIN CANCER EXCISION     eyelid  . TOTAL KNEE ARTHROPLASTY  07/27/2011   Procedure: TOTAL KNEE ARTHROPLASTY;  Surgeon: Gearlean Alf;  Location: [facility];  Service: Orthopedics;   Laterality: Left;    SOCIAL HISTORY: Social History  Substance Use Topics  . Smoking status: Never Smoker  . Smokeless tobacco: Never Used  . Alcohol use Yes     Comment: occasionally    FAMILY HISTORY: Family History  Problem Relation Age of Onset  . Breast cancer Unknown   . Tongue cancer Mother   . Breast cancer Mother   . Anxiety disorder Mother   . Depression Mother   . Heart disease Father   . Hypertension Father   . Hyperlipidemia Father   . Stroke Paternal Grandmother   . Colon cancer Neg Hx     ROS: Review of Systems  Constitutional: Negative for weight loss.  Cardiovascular: Negative for chest pain and claudication.  Musculoskeletal: Negative for myalgias.    PHYSICAL EXAM: Blood pressure 112/74, pulse 63, temperature 97.6 F (36.4 C), temperature source Oral, height 5\' 8"  (1.727 m), weight 219 lb (99.3 kg), SpO2 99 %. Body mass index is 33.3 kg/m. Physical Exam  Constitutional: Kathy Clark is oriented to person, place, and time. Kathy Clark appears well-developed and well-nourished.  Cardiovascular: Normal rate.   Pulmonary/Chest: Effort normal.  Musculoskeletal: Normal range of motion.  Neurological: Kathy Clark is oriented to person, place, and time.  Skin: Skin is warm and dry.  Psychiatric: Kathy Clark has a normal mood and affect. Her behavior is normal.  Vitals reviewed.   RECENT LABS AND TESTS: BMET    Component Value Date/Time   NA 142 03/10/2017 0828   K 4.5 03/10/2017 0828   CL 99 03/10/2017 0828   CO2 24 03/10/2017 0828   GLUCOSE 78 03/10/2017 0828   GLUCOSE 79 03/26/2016 1600   BUN 14 03/10/2017 0828   CREATININE 0.78 03/10/2017 0828   CREATININE 0.61 03/26/2016 1600   CALCIUM 9.7 03/10/2017 0828   GFRNONAA 85 03/10/2017 0828   GFRNONAA >89 03/26/2016 1600   GFRAA 98 03/10/2017 0828   GFRAA >89 03/26/2016 1600   Lab Results  Component Value Date   HGBA1C 5.1 12/01/2016   HGBA1C 4.9 08/03/2016   Lab Results  Component Value Date   INSULIN 5.4  12/01/2016   INSULIN 5.9 08/03/2016   CBC    Component Value Date/Time   WBC 6.4 08/03/2016 1458   WBC 6.7 03/26/2016 1600   RBC 4.56 08/03/2016 1458   RBC 4.68 03/26/2016 1600   HGB 13.6 08/03/2016 1458   HCT 41.3 08/03/2016 1458   PLT 287 03/26/2016 1600   MCV 91 08/03/2016 1458   MCH 29.8 08/03/2016 1458   MCH 30.6 03/26/2016 1600   MCHC 32.9 08/03/2016 1458   MCHC 33.6 03/26/2016 1600   RDW 12.9 08/03/2016 1458   LYMPHSABS 1.8 08/03/2016 1458   MONOABS 469 03/26/2016 1600   EOSABS 0.2 08/03/2016  1458   BASOSABS 0.0 08/03/2016 1458   Iron/TIBC/Ferritin/ %Sat    Component Value Date/Time   FERRITIN 142 12/25/2015 0912   Lipid Panel     Component Value Date/Time   CHOL 190 03/10/2017 0828   TRIG 83 03/10/2017 0828   HDL 61 03/10/2017 0828   CHOLHDL 3.0 04/05/2015 0937   VLDL 21 04/05/2015 0937   LDLCALC 112 (H) 03/10/2017 0828   Hepatic Function Panel     Component Value Date/Time   PROT 6.6 03/10/2017 0828   ALBUMIN 4.2 03/10/2017 0828   AST 16 03/10/2017 0828   ALT 22 03/10/2017 0828   ALKPHOS 50 03/10/2017 0828   BILITOT 0.3 03/10/2017 0828   BILIDIR 0.1 12/25/2015 0912   IBILI 0.5 12/25/2015 0912      Component Value Date/Time   TSH 1.470 08/03/2016 1458   TSH 2.09 03/26/2016 1600   TSH 3.10 12/25/2015 0912    ASSESSMENT AND PLAN: Other hyperlipidemia  Class 1 obesity with serious comorbidity and body mass index (BMI) of 33.0 to 33.9 in adult, unspecified obesity type  PLAN:  Hyperlipidemia Elliett was informed of the American Heart Association Guidelines emphasizing intensive lifestyle modifications as the first line treatment for hyperlipidemia. We discussed many lifestyle modifications today in depth, and Syanne will continue to work on decreasing saturated fats such as fatty red meat, butter and many fried foods. Kathy Clark will also increase vegetables and lean protein in her diet and continue to work on diet, exercise and weight loss efforts.  We  spent > than 50% of the 15 minute visit on the counseling as documented in the note.  Obesity Kathy Clark is currently in the action stage of change. As such, her goal is to continue with weight loss efforts Kathy Clark has agreed to change to keep a food journal with 1550 calories and 100 + grams of protein daily Kathy Clark has been instructed to work up to a goal of 150 minutes of combined cardio and strengthening exercise per week for weight loss and overall health benefits. We discussed the following Behavioral Modification Strategies today: increasing lean protein intake and work on meal planning and easy cooking plans   Kathy Clark has agreed to follow up with our clinic in 2 weeks. Kathy Clark was informed of the importance of frequent follow up visits to maximize her success with intensive lifestyle modifications for her multiple health conditions.  I, Trixie Dredge, am acting as transcriptionist for Lacy Duverney, PA-C  I have reviewed the above documentation for accuracy and completeness, and I agree with the above. -Lacy Duverney, PA-C  I have reviewed the above note and agree with the plan. -Dennard Nip, MD   OBESITY BEHAVIORAL INTERVENTION VISIT  Today's visit was # 16 out of 54.  Starting weight: 237 lbs Starting date: 08/03/16 Today's weight: 219 lbs Today's date: 03/23/17 Total lbs lost to date: 79 (Patients must lose 7 lbs in the first 6 months to continue with counseling)   ASK: We discussed the diagnosis of obesity with Kathy Clark today and Kathy Clark agreed to give [place] permission to discuss obesity behavioral modification therapy today.  ASSESS: Kathy Clark has the diagnosis of obesity and her BMI today is 68 Kathy Clark is in the action stage of change   ADVISE: Kathy Clark was educated on the multiple health risks of obesity as well as the benefit of weight loss to improve her health. Kathy Clark was advised of the need for long term treatment and the importance of lifestyle modifications.  AGREE: Multiple  dietary  modification options and treatment options were discussed and  Kathy Clark agreed to keep a food journal with 1550 calories and 100 + grams of protein daily We discussed the following Behavioral Modification Strategies today: increasing lean protein intake and work on meal planning and easy cooking plans

## 2017-03-23 NOTE — Telephone Encounter (Signed)
Call treatment and let her know that he get her compliance report. She is currently set at 12 cm water pressure which looks fantastic. Her AHI is 1.7 on this which means it is therapeutic. And she's been able to wear it for over 7 hours consistently which is fantastic.

## 2017-03-24 DIAGNOSIS — Z96659 Presence of unspecified artificial knee joint: Secondary | ICD-10-CM | POA: Diagnosis not present

## 2017-03-24 DIAGNOSIS — G4733 Obstructive sleep apnea (adult) (pediatric): Secondary | ICD-10-CM | POA: Diagnosis not present

## 2017-03-25 NOTE — Telephone Encounter (Signed)
Left message advising of recommendations.  

## 2017-04-01 MED FILL — ESTRADIOL 0.075 MG PATCH: 0.075 | 84 days supply | Qty: 24 | Fill #0

## 2017-04-06 ENCOUNTER — Ambulatory Visit (INDEPENDENT_AMBULATORY_CARE_PROVIDER_SITE_OTHER): Payer: 59 | Admitting: Physician Assistant

## 2017-04-06 VITALS — BP 100/68 | HR 71 | Temp 97.6°F | Ht 68.0 in | Wt 221.0 lb

## 2017-04-06 DIAGNOSIS — Z9189 Other specified personal risk factors, not elsewhere classified: Secondary | ICD-10-CM | POA: Diagnosis not present

## 2017-04-06 DIAGNOSIS — E669 Obesity, unspecified: Secondary | ICD-10-CM

## 2017-04-06 DIAGNOSIS — Z6833 Body mass index (BMI) 33.0-33.9, adult: Secondary | ICD-10-CM

## 2017-04-06 DIAGNOSIS — E559 Vitamin D deficiency, unspecified: Secondary | ICD-10-CM

## 2017-04-06 DIAGNOSIS — F3289 Other specified depressive episodes: Secondary | ICD-10-CM | POA: Diagnosis not present

## 2017-04-06 MED ORDER — BUPROPION HCL ER (SR) 150 MG PO TB12: 150.0000 mg | ORAL_TABLET | Freq: Every day | ORAL | 0 refills | Status: DC

## 2017-04-06 MED ORDER — VITAMIN D (ERGOCALCIFEROL) 1.25 MG (50000 UNIT) PO CAPS: 50000.0000 [IU] | ORAL_CAPSULE | ORAL | 0 refills | Status: DC

## 2017-04-06 MED FILL — VIT D2 1.25 MG (50,000 UNIT: 1.25 MG | 28 days supply | Qty: 4 | Fill #0

## 2017-04-06 MED FILL — BUPROPION SR 150 MG TABLET: 150 | 30 days supply | Qty: 30 | Fill #0

## 2017-04-06 NOTE — Progress Notes (Signed)
Office: [phone]  /  Fax: [phone]   HPI:   Chief Complaint: OBESITY Kathy Clark is here to discuss her progress with her obesity treatment plan. She is on the Category 3 plan and is following her eating plan approximately 60 % of the time. She states she is walking for 30 minutes 3 times per week. Kathy Clark has had increased cravings and has not followed the plan. She would like more meal plan options. Her weight is 221 lb (100.2 kg) today and has had a weight gain of 2 pounds over a period of 2 weeks since her last visit. She has lost 16 lbs since starting treatment with [place].  Vitamin D deficiency Kathy Clark has a diagnosis of vitamin D deficiency. She is currently taking vit D and denies nausea, vomiting or muscle weakness.  At risk for osteopenia and osteoporosis Kathy Clark is at higher risk of osteopenia and osteoporosis due to vitamin D deficiency.   Depression with emotional eating behaviors Kathy Clark is struggling with emotional eating and using food for comfort to the extent that it is negatively impacting her health. She often snacks when she is not hungry. Kathy Clark sometimes feels she is out of control and then feels guilty that she made poor food choices. She has been working on behavior modification techniques to help reduce her emotional eating and has been somewhat successful. Her mood is stable and she shows no sign of suicidal or homicidal ideations.  Depression screen Kathy Clark 2/9 11/16/2016 08/03/2016 01/21/2016 08/26/2015 07/11/2015  Decreased Interest 2 3 0 1 1  Down, Depressed, Hopeless 2 3 0 1 2  PHQ - 2 Score 4 6 0 2 3  Altered sleeping 3 3 2 2 2   Tired, decreased energy 3 3 2 2 2   Change in appetite 1 3 1  0 2  Feeling bad or failure about yourself  1 3 0 0 1  Trouble concentrating 2 2 1  0 1  Moving slowly or fidgety/restless 1 0 0 0 0  Suicidal thoughts 0 0 0 0 0  PHQ-9 Score 15 20 6 6 11   Difficult doing work/chores - - Somewhat difficult Somewhat difficult Somewhat difficult       ALLERGIES: Allergies  Allergen Reactions  . Succinylcholine Anaphylaxis  . Dilaudid [Hydromorphone Hcl] Nausea And Vomiting    MEDICATIONS: Current Outpatient Prescriptions on File Prior to Visit  Medication Sig Dispense Refill  . albuterol (PROVENTIL HFA;VENTOLIN HFA) 108 (90 Base) MCG/ACT inhaler Inhale 2 puffs into the lungs every 4 (four) hours as needed for wheezing or shortness of breath. 1 Inhaler 2  . AMBULATORY NON FORMULARY MEDICATION Medication Name: Cpap set to 12 cm water pressure. Humidified, with mask tubing, etc. Dx: Moderate OSA with AHI = 18.4. Please fax to [place] [facility] Please send download on 03/22/2016 1 Units 0  . azithromycin (ZITHROMAX) 250 MG tablet Take 2 tabs now then 1 daily times 4 days. (Patient not taking: Reported on 04/06/2017) 6 tablet 0  . benzonatate (TESSALON PERLES) 100 MG capsule Take 1-2 capsules (100-200 mg total) by mouth every 8 (eight) hours as needed for cough. (Patient not taking: Reported on 04/06/2017) 30 capsule 0  . estradiol (VIVELLE-DOT) 0.075 MG/24HR   3  . gabapentin (NEURONTIN) 300 MG capsule Take 300 mg by mouth 2 (two) times daily.     . hydrochlorothiazide (MICROZIDE) 12.5 MG capsule Take 1 capsule (12.5 mg total) by mouth daily. 90 capsule 1  . losartan (COZAAR) 25 MG tablet Take 1 tablet (25 mg  total) by mouth daily. 90 tablet 3  . Melatonin 3 MG TABS Take by mouth as directed.    . Multiple Vitamins-Minerals (MULTIVITAMIN ADULT PO) Take by mouth daily.    . predniSONE (DELTASONE) 5 MG tablet Take 1 tablet (5 mg total) by mouth as directed. 6 day standard taper (Patient not taking: Reported on 04/06/2017) 21 tablet 0  . SUMAtriptan (IMITREX) 20 MG/ACT nasal spray PLACE 1 SPRAY INTO THE NOSE EVERY 2 HOURS AS NEEDED FOR. HEADACHE 6 Inhaler 2  . Vilazodone HCl (VIIBRYD) 20 MG TABS Take 1 tablet (20 mg total) by mouth daily. 90 tablet 0  . Vitamin D, Ergocalciferol, (DRISDOL) 50000 units CAPS capsule Take 1 capsule (50,000  Units total) by mouth every 7 (seven) days. 4 capsule 0  . [DISCONTINUED] rivaroxaban (XARELTO) 10 MG TABS tablet Take 1 tablet (10 mg total) by mouth daily with breakfast. 18 tablet 0   No current facility-administered medications on file prior to visit.     PAST MEDICAL HISTORY: Past Medical History:  Diagnosis Date  . Anxiety   . Back pain   . 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 Depression   . Essential hypertension, benign   . Gastritis   . GERD (gastroesophageal reflux disease)   . Hypertension   . Joint pain   . Lymphocytic colitis    microscopic  . Migraines   . Osteoarthritis   . Sciatica    right leg  . Swelling    feet, left foot    PAST SURGICAL HISTORY: Past Surgical History:  Procedure Laterality Date  . ABDOMINAL HYSTERECTOMY  07-2005  . [place] Bunionectomy Left 09/18/2016   Left Foot  . BACK SURGERY    . KNEE SURGERY  2006   left; multiple  . microdiscetomy  2005  . SHOULDER SURGERY  07-22-09   right  . SKIN CANCER EXCISION     eyelid  . TOTAL KNEE ARTHROPLASTY  07/27/2011   Procedure: TOTAL KNEE ARTHROPLASTY;  Surgeon: Gearlean Alf;  Location: [facility];  Service: Orthopedics;  Laterality: Left;    SOCIAL HISTORY: Social History  Substance Use Topics  . Smoking status: Never Smoker  . Smokeless tobacco: Never Used  . Alcohol use Yes     Comment: occasionally    FAMILY HISTORY: Family History  Problem Relation Age of Onset  . Breast cancer Unknown   . Tongue cancer Mother   . Breast cancer Mother   . Anxiety disorder Mother   . Depression Mother   . Heart disease Father   . Hypertension Father   . Hyperlipidemia Father   . Stroke Paternal Grandmother   . Colon cancer Neg Hx     ROS: Review of Systems  Constitutional: Negative for weight loss.  Gastrointestinal: Negative for nausea and vomiting.  Musculoskeletal:       Negative muscle weakness  Psychiatric/Behavioral: Positive for depression. Negative  for suicidal ideas.    PHYSICAL EXAM: Blood pressure 100/68, pulse 71, temperature 97.6 F (36.4 C), temperature source Oral, height 5\' 8"  (1.727 m), weight 221 lb (100.2 kg), SpO2 98 %. Body mass index is 33.6 kg/m. Physical Exam  Constitutional: She is oriented to person, place, and time. She appears well-developed and well-nourished.  Cardiovascular: Normal rate.   Pulmonary/Chest: Effort normal.  Musculoskeletal: Normal range of motion.  Neurological: She is oriented to person, place, and time.  Skin: Skin is warm and dry.  Psychiatric: She has a normal mood and  affect. Her behavior is normal.  Vitals reviewed.   RECENT LABS AND TESTS: BMET    Component Value Date/Time   NA 142 03/10/2017 0828   K 4.5 03/10/2017 0828   CL 99 03/10/2017 0828   CO2 24 03/10/2017 0828   GLUCOSE 78 03/10/2017 0828   GLUCOSE 79 03/26/2016 1600   BUN 14 03/10/2017 0828   CREATININE 0.78 03/10/2017 0828   CREATININE 0.61 03/26/2016 1600   CALCIUM 9.7 03/10/2017 0828   GFRNONAA 85 03/10/2017 0828   GFRNONAA >89 03/26/2016 1600   GFRAA 98 03/10/2017 0828   GFRAA >89 03/26/2016 1600   Lab Results  Component Value Date   HGBA1C 5.1 12/01/2016   HGBA1C 4.9 08/03/2016   Lab Results  Component Value Date   INSULIN 5.4 12/01/2016   INSULIN 5.9 08/03/2016   CBC    Component Value Date/Time   WBC 6.4 08/03/2016 1458   WBC 6.7 03/26/2016 1600   RBC 4.56 08/03/2016 1458   RBC 4.68 03/26/2016 1600   HGB 13.6 08/03/2016 1458   HCT 41.3 08/03/2016 1458   PLT 287 03/26/2016 1600   MCV 91 08/03/2016 1458   MCH 29.8 08/03/2016 1458   MCH 30.6 03/26/2016 1600   MCHC 32.9 08/03/2016 1458   MCHC 33.6 03/26/2016 1600   RDW 12.9 08/03/2016 1458   LYMPHSABS 1.8 08/03/2016 1458   MONOABS 469 03/26/2016 1600   EOSABS 0.2 08/03/2016 1458   BASOSABS 0.0 08/03/2016 1458   Iron/TIBC/Ferritin/ %Sat    Component Value Date/Time   FERRITIN 142 12/25/2015 0912   Lipid Panel     Component Value  Date/Time   CHOL 190 03/10/2017 0828   TRIG 83 03/10/2017 0828   HDL 61 03/10/2017 0828   CHOLHDL 3.0 04/05/2015 0937   VLDL 21 04/05/2015 0937   LDLCALC 112 (H) 03/10/2017 0828   Hepatic Function Panel     Component Value Date/Time   PROT 6.6 03/10/2017 0828   ALBUMIN 4.2 03/10/2017 0828   AST 16 03/10/2017 0828   ALT 22 03/10/2017 0828   ALKPHOS 50 03/10/2017 0828   BILITOT 0.3 03/10/2017 0828   BILIDIR 0.1 12/25/2015 0912   IBILI 0.5 12/25/2015 0912      Component Value Date/Time   TSH 1.470 08/03/2016 1458   TSH 2.09 03/26/2016 1600   TSH 3.10 12/25/2015 0912    ASSESSMENT AND PLAN: Vitamin D deficiency - Plan: Vitamin D, Ergocalciferol, (DRISDOL) 50000 units CAPS capsule  Other depression - with emotional eating - Plan: buPROPion (WELLBUTRIN SR) 150 MG 12 hr tablet  At risk for osteoporosis  Class 1 obesity with serious comorbidity and body mass index (BMI) of 33.0 to 33.9 in adult, unspecified obesity type  PLAN:  Vitamin D Deficiency Shaylyn was informed that low vitamin D levels contributes to fatigue and are associated with obesity, breast, and colon cancer. She agrees to continue to take prescription Vit D @50 ,000 IU every week, we will refill for 1 month and will follow up for routine testing of vitamin D, at least 2-3 times per year. She was informed of the risk of over-replacement of vitamin D and agrees to not increase her dose unless he discusses this with [place] first. Aydan agrees to follow up with our clinic in 2 weeks.  At risk for osteopenia and osteoporosis Braelynn is at risk for osteopenia and osteoporosis due to her vitamin D deficiency. She was encouraged to take her vitamin D and follow her higher calcium diet and increase strengthening  exercise to help strengthen her bones and decrease her risk of osteopenia and osteoporosis.  Depression with Emotional Eating Behaviors We discussed behavior modification techniques today to help Ashanti deal with her  emotional eating and depression. She has agreed to start Wellbutrin SR 150 mg qd #30 with no refills and will follow up as directed.  Obesity Alverta is currently in the action stage of change. As such, her goal is to continue with weight loss efforts She has agreed to follow a lower carbohydrate, vegetable and lean protein rich diet plan Jett has been instructed to work up to a goal of 150 minutes of combined cardio and strengthening exercise per week for weight loss and overall health benefits. We discussed the following Behavioral Modification Strategies today: increasing lean protein intake and work on meal planning and easy cooking plans  Dorrie has agreed to follow up with our clinic in 2 weeks. She was informed of the importance of frequent follow up visits to maximize her success with intensive lifestyle modifications for her multiple health conditions.  I, Doreene Nest, am acting as transcriptionist for Lacy Duverney, PA-C  I have reviewed the above documentation for accuracy and completeness, and I agree with the above. -Lacy Duverney, PA-C  I have reviewed the above note and agree with the plan. -Dennard Nip, MD  OBESITY BEHAVIORAL INTERVENTION VISIT  Today's visit was # 17 out of 83.  Starting weight: 237 lbs Starting date: 08/03/16 Today's weight : 221 lbs Today's date: 04/06/2017 Total lbs lost to date: 19 (Patients must lose 7 lbs in the first 6 months to continue with counseling)   ASK: We discussed the diagnosis of obesity with Marianne Sofia today and Kathy Clark agreed to give [place] permission to discuss obesity behavioral modification therapy today.  ASSESS: Kathy Clark has the diagnosis of obesity and her BMI today is 33.61 Kathy Clark is in the action stage of change   ADVISE: Kathy Clark was educated on the multiple health risks of obesity as well as the benefit of weight loss to improve her health. She was advised of the need for long term treatment and the importance of lifestyle  modifications.  AGREE: Multiple dietary modification options and treatment options were discussed and  Kathy Clark agreed to follow a lower carbohydrate, vegetable and lean protein rich diet plan We discussed the following Behavioral Modification Strategies today: increasing lean protein intake and work on meal planning and easy cooking plans

## 2017-04-09 ENCOUNTER — Ambulatory Visit (INDEPENDENT_AMBULATORY_CARE_PROVIDER_SITE_OTHER): Payer: 59 | Admitting: Sports Medicine

## 2017-04-09 DIAGNOSIS — G5603 Carpal tunnel syndrome, bilateral upper limbs: Secondary | ICD-10-CM

## 2017-04-09 NOTE — Assessment & Plan Note (Addendum)
Bilateral median nerve hydrodissection as above, previous procedure was about 3-4 months ago. I did finally convince her to go ahead and get an opinion from hand surgery. Return as needed.

## 2017-04-09 NOTE — Progress Notes (Signed)
  Subjective:    CC: Carpal tunnel  HPI: This is a pleasant 56 year old female, she has clinically diagnosed carpal tunnel syndrome, done well with splinting initially followed by occasional injections. She is here with a recurrence of symptoms, previous injection was 3-4 months ago.  Initially she was hesitant about proceeding to carpal tunnel release, I have advised her to at least have a consultation with one of our hand surgeons.  Past medical history:  Negative.  See flowsheet/record as well for more information.  Surgical history: Negative.  See flowsheet/record as well for more information.  Family history: Negative.  See flowsheet/record as well for more information.  Social history: Negative.  See flowsheet/record as well for more information.  Allergies, and medications have been entered into the medical record, reviewed, and no changes needed.   Review of Systems: No fevers, chills, night sweats, weight loss, chest pain, or shortness of breath.   Objective:    General: Well Developed, well nourished, and in no acute distress.  Neuro: Alert and oriented x3, extra-ocular muscles intact, sensation grossly intact.  HEENT: Normocephalic, atraumatic, pupils equal round reactive to light, neck supple, no masses, no lymphadenopathy, thyroid nonpalpable.  Skin: Warm and dry, no rashes. Cardiac: Regular rate and rhythm, no murmurs rubs or gallops, no lower extremity edema.  Respiratory: Clear to auscultation bilaterally. Not using accessory muscles, speaking in full sentences.  Procedure: Real-time Ultrasound Guided right median nerve hydrodissection Device: GE Logiq E  Verbal informed consent obtained.  Time-out conducted.  Noted no overlying erythema, induration, or other signs of local infection.  Skin prepped in a sterile fashion.  Local anesthesia: Topical Ethyl chloride.  With sterile technique and under real time ultrasound guidance: Using a total of 1 mL kenalog 40, 5 mL  lidocaine injected medication both superficial to and deep to the median nerve in the carpal tunnel freeing it from surrounding structures, I then redirected and injected further medication deep around the flexor tendons. Completed without difficulty  Pain immediately resolved suggesting accurate placement of the medication.  Advised to call if fevers/chills, erythema, induration, drainage, or persistent bleeding.  Images permanently stored and available for review in the ultrasound unit.  Impression: Technically successful ultrasound guided injection.  Procedure: Real-time Ultrasound Guided left median nerve hydrodissection Device: GE Logiq E  Verbal informed consent obtained.  Time-out conducted.  Noted no overlying erythema, induration, or other signs of local infection.  Skin prepped in a sterile fashion.  Local anesthesia: Topical Ethyl chloride.  With sterile technique and under real time ultrasound guidance: Using a total of 1 mL kenalog 40, 5 mL lidocaine injected medication both superficial to and deep to the median nerve in the carpal tunnel freeing it from surrounding structures, I then redirected and injected further medication deep around the flexor tendons. Completed without difficulty  Pain immediately resolved suggesting accurate placement of the medication.  Advised to call if fevers/chills, erythema, induration, drainage, or persistent bleeding.  Images permanently stored and available for review in the ultrasound unit.  Impression: Technically successful ultrasound guided injection.  Impression and Recommendations:    Bilateral carpal tunnel syndrome Bilateral median nerve hydrodissection as above, previous procedure was about 3-4 months ago. I did finally convince her to go ahead and get an opinion from hand surgery. Return as needed.  ___________________________________________ Gwen Her. Dianah Field, M.D., ABFM., CAQSM. Primary Care and [place] Instructor of [employer] [facility]

## 2017-04-13 ENCOUNTER — Other Ambulatory Visit: Payer: Self-pay | Admitting: Family Medicine

## 2017-04-13 MED FILL — VIIBRYD 20 MG TABLET: 20 | 90 days supply | Qty: 90 | Fill #0

## 2017-04-16 ENCOUNTER — Other Ambulatory Visit: Payer: Self-pay | Admitting: Family Medicine

## 2017-04-16 MED FILL — SUMAtriptan 20 MG/ACT SOLN: 20 | 30 days supply | Qty: 6 | Fill #0

## 2017-04-21 ENCOUNTER — Ambulatory Visit (INDEPENDENT_AMBULATORY_CARE_PROVIDER_SITE_OTHER): Payer: 59 | Admitting: Physician Assistant

## 2017-04-21 VITALS — BP 120/83 | HR 69 | Temp 98.0°F | Ht 68.0 in | Wt 212.0 lb

## 2017-04-21 DIAGNOSIS — E669 Obesity, unspecified: Secondary | ICD-10-CM | POA: Diagnosis not present

## 2017-04-21 DIAGNOSIS — Z6832 Body mass index (BMI) 32.0-32.9, adult: Secondary | ICD-10-CM

## 2017-04-21 DIAGNOSIS — I10 Essential (primary) hypertension: Secondary | ICD-10-CM | POA: Diagnosis not present

## 2017-04-21 NOTE — Progress Notes (Signed)
Office: [phone]  /  Fax: [phone]   HPI:   Chief Complaint: OBESITY Kathy Clark is here to discuss her progress with her obesity treatment plan. She is on the lower carbohydrate, vegetable and lean protein rich diet plan and is following her eating plan approximately 100 % of the time. She states she is walking for 30 minutes 5 times per week. Kathy Clark tolerated the lower carbohydrate meal plan well. She states her hunger was well controlled because she was able to eat more protein. Kathy Clark states she feels her energy and fatigue have improved. Her weight is 212 lb (96.2 kg) today and has had a weight loss of 9 pounds over a period of 4 weeks since her last visit. She has lost 25 lbs since starting treatment with [place].  Hypertension Kathy Clark is a 56 y.o. female with hypertension.  Kathy Clark denies chest pain or shortness of breath on exertion. She is working weight loss to help control her blood pressure with the goal of decreasing her risk of heart attack and stroke. Kathy Clark blood pressure is currently stable.   ALLERGIES: Allergies  Allergen Reactions  . Succinylcholine Anaphylaxis  . Dilaudid [Hydromorphone Hcl] Nausea And Vomiting    MEDICATIONS: Current Outpatient Prescriptions on File Prior to Visit  Medication Sig Dispense Refill  . AMBULATORY NON FORMULARY MEDICATION Medication Name: Cpap set to 12 cm water pressure. Humidified, with mask tubing, etc. Dx: Moderate OSA with AHI = 18.4. Please fax to [place] Please send download on 03/22/2016 1 Units 0  . buPROPion (WELLBUTRIN SR) 150 MG 12 hr tablet Take 1 tablet (150 mg total) by mouth daily. 30 tablet 0  . estradiol (VIVELLE-DOT) 0.075 MG/24HR   3  . gabapentin (NEURONTIN) 300 MG capsule Take 300 mg by mouth 2 (two) times daily.     . hydrochlorothiazide (MICROZIDE) 12.5 MG capsule Take 1 capsule (12.5 mg total) by mouth daily. 90 capsule 1  . losartan (COZAAR) 25 MG tablet Take 1 tablet (25 mg total)  by mouth daily. 90 tablet 3  . Melatonin 3 MG TABS Take by mouth as directed.    . Multiple Vitamins-Minerals (MULTIVITAMIN ADULT PO) Take by mouth daily.    . SUMAtriptan (IMITREX) 20 MG/ACT nasal spray PLACE 1 SPRAY INTO THE NOSE EVERY 2 HOURS AS NEEDED FOR. HEADACHE 6 Inhaler 2  . VIIBRYD 20 MG TABS TAKE 1 TABLET (20 MG TOTAL) BY MOUTH DAILY. 90 tablet 2  . Vitamin D, Ergocalciferol, (DRISDOL) 50000 units CAPS capsule Take 1 capsule (50,000 Units total) by mouth every 7 (seven) days. 4 capsule 0  . [DISCONTINUED] rivaroxaban (XARELTO) 10 MG TABS tablet Take 1 tablet (10 mg total) by mouth daily with breakfast. 18 tablet 0   No current facility-administered medications on file prior to visit.     PAST MEDICAL HISTORY: Past Medical History:  Diagnosis Date  . Anxiety   . Back pain   . 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 Depression   . Essential hypertension, benign   . Gastritis   . GERD (gastroesophageal reflux disease)   . Hypertension   . Joint pain   . Lymphocytic colitis    microscopic  . Migraines   . Osteoarthritis   . Sciatica    right leg  . Swelling    feet, left foot    PAST SURGICAL HISTORY: Past Surgical History:  Procedure Laterality Date  . ABDOMINAL HYSTERECTOMY  07-2005  . Kathy Clark  Bunionectomy Left 09/18/2016   Left Foot  . BACK SURGERY    . KNEE SURGERY  2006   left; multiple  . microdiscetomy  2005  . SHOULDER SURGERY  07-22-09   right  . SKIN CANCER EXCISION     eyelid  . TOTAL KNEE ARTHROPLASTY  07/27/2011   Procedure: TOTAL KNEE ARTHROPLASTY;  Surgeon: Gearlean Alf;  Location: [facility];  Service: Orthopedics;  Laterality: Left;    SOCIAL HISTORY: Social History  Substance Use Topics  . Smoking status: Never Smoker  . Smokeless tobacco: Never Used  . Alcohol use Yes     Comment: occasionally    FAMILY HISTORY: Family History  Problem Relation Age of Onset  . Breast cancer Unknown   . Tongue cancer Mother     . Breast cancer Mother   . Anxiety disorder Mother   . Depression Mother   . Heart disease Father   . Hypertension Father   . Hyperlipidemia Father   . Stroke Paternal Grandmother   . Colon cancer Neg Hx     ROS: Review of Systems  Constitutional: Positive for weight loss.  Respiratory: Negative for shortness of breath (on exertion).   Cardiovascular: Negative for chest pain.    PHYSICAL EXAM: Blood pressure 120/83, pulse 69, temperature 98 F (36.7 C), temperature source Oral, height 5\' 8"  (1.727 m), weight 212 lb (96.2 kg), SpO2 99 %. Body mass index is 32.23 kg/m. Physical Exam  Constitutional: She is oriented to person, place, and time. She appears well-developed and well-nourished.  Cardiovascular: Normal rate.   Pulmonary/Chest: Effort normal.  Musculoskeletal: Normal range of motion.  Neurological: She is oriented to person, place, and time.  Skin: Skin is warm and dry.  Psychiatric: She has a normal mood and affect. Her behavior is normal.  Vitals reviewed.   RECENT LABS AND TESTS: BMET    Component Value Date/Time   NA 142 03/10/2017 0828   K 4.5 03/10/2017 0828   CL 99 03/10/2017 0828   CO2 24 03/10/2017 0828   GLUCOSE 78 03/10/2017 0828   GLUCOSE 79 03/26/2016 1600   BUN 14 03/10/2017 0828   CREATININE 0.78 03/10/2017 0828   CREATININE 0.61 03/26/2016 1600   CALCIUM 9.7 03/10/2017 0828   GFRNONAA 85 03/10/2017 0828   GFRNONAA >89 03/26/2016 1600   GFRAA 98 03/10/2017 0828   GFRAA >89 03/26/2016 1600   Lab Results  Component Value Date   HGBA1C 5.1 12/01/2016   HGBA1C 4.9 08/03/2016   Lab Results  Component Value Date   INSULIN 5.4 12/01/2016   INSULIN 5.9 08/03/2016   CBC    Component Value Date/Time   WBC 6.4 08/03/2016 1458   WBC 6.7 03/26/2016 1600   RBC 4.56 08/03/2016 1458   RBC 4.68 03/26/2016 1600   HGB 13.6 08/03/2016 1458   HCT 41.3 08/03/2016 1458   PLT 287 03/26/2016 1600   MCV 91 08/03/2016 1458   MCH 29.8 08/03/2016  1458   MCH 30.6 03/26/2016 1600   MCHC 32.9 08/03/2016 1458   MCHC 33.6 03/26/2016 1600   RDW 12.9 08/03/2016 1458   LYMPHSABS 1.8 08/03/2016 1458   MONOABS 469 03/26/2016 1600   EOSABS 0.2 08/03/2016 1458   BASOSABS 0.0 08/03/2016 1458   Iron/TIBC/Ferritin/ %Sat    Component Value Date/Time   FERRITIN 142 12/25/2015 0912   Lipid Panel     Component Value Date/Time   CHOL 190 03/10/2017 0828   TRIG 83 03/10/2017 0828   HDL  61 03/10/2017 0828   CHOLHDL 3.0 04/05/2015 0937   VLDL 21 04/05/2015 0937   LDLCALC 112 (H) 03/10/2017 0828   Hepatic Function Panel     Component Value Date/Time   PROT 6.6 03/10/2017 0828   ALBUMIN 4.2 03/10/2017 0828   AST 16 03/10/2017 0828   ALT 22 03/10/2017 0828   ALKPHOS 50 03/10/2017 0828   BILITOT 0.3 03/10/2017 0828   BILIDIR 0.1 12/25/2015 0912   IBILI 0.5 12/25/2015 0912      Component Value Date/Time   TSH 1.470 08/03/2016 1458   TSH 2.09 03/26/2016 1600   TSH 3.10 12/25/2015 0912    ASSESSMENT AND PLAN: Essential hypertension  Class 1 obesity with serious comorbidity and body mass index (BMI) of 32.0 to 32.9 in adult, unspecified obesity type  PLAN:  Hypertension We discussed sodium restriction, working on healthy weight loss, and a regular exercise program as the means to achieve improved blood pressure control. [place] agreed with this plan and agreed to follow up as directed. We will continue to monitor her blood pressure as well as her progress with the above lifestyle modifications. She will continue her medications as prescribed and will watch for signs of hypotension as she continues her lifestyle modifications.  We spent > than 50% of the 15 minute visit on the counseling as documented in the note.  Obesity Kathy Clark is currently in the action stage of change. As such, her goal is to continue with weight loss efforts She has agreed to keep a food journal with 1550 calories and 100 grams of protein daily Kathy Clark has been  instructed to work up to a goal of 150 minutes of combined cardio and strengthening exercise per week for weight loss and overall health benefits. We discussed the following Behavioral Modification Strategies today: increasing lean protein intake and better snacking choices  Kathy Clark has agreed to follow up with our clinic in 2 weeks. She was informed of the importance of frequent follow up visits to maximize her success with intensive lifestyle modifications for her multiple health conditions.  I, Doreene Nest, am acting as transcriptionist for Kathy Duverney, PA-C  I have reviewed the above documentation for accuracy and completeness, and I agree with the above. -Kathy Duverney, PA-C  I have reviewed the above note and agree with the plan. -Kathy Nip, MD   OBESITY BEHAVIORAL INTERVENTION VISIT  Today's visit was # 18 out of 22.  Starting weight: 237 lbs Starting date: 08/03/16 Today's weight : 212 lbs  Today's date: 04/21/2017 Total lbs lost to date: 25 (Patients must lose 7 lbs in the first 6 months to continue with counseling)   ASK: We discussed the diagnosis of obesity with Kathy Clark today and Kathy Clark agreed to give [place] permission to discuss obesity behavioral modification therapy today.  ASSESS: Kathy Clark has the diagnosis of obesity and her BMI today is 32.24 Kathy Clark is in the action stage of change   ADVISE: Kathy Clark was educated on the multiple health risks of obesity as well as the benefit of weight loss to improve her health. She was advised of the need for long term treatment and the importance of lifestyle modifications.  AGREE: Multiple dietary modification options and treatment options were discussed and  Kathy Clark agreed to keep a food journal with 1550 calories and 100 grams of protein daily We discussed the following Behavioral Modification Strategies today: increasing lean protein intake and better snacking choices

## 2017-04-23 DIAGNOSIS — Z96659 Presence of unspecified artificial knee joint: Secondary | ICD-10-CM | POA: Diagnosis not present

## 2017-04-23 DIAGNOSIS — G4733 Obstructive sleep apnea (adult) (pediatric): Secondary | ICD-10-CM | POA: Diagnosis not present

## 2017-05-04 ENCOUNTER — Encounter: Payer: Self-pay | Admitting: Family Medicine

## 2017-05-04 ENCOUNTER — Ambulatory Visit (INDEPENDENT_AMBULATORY_CARE_PROVIDER_SITE_OTHER): Payer: 59 | Admitting: Family Medicine

## 2017-05-04 VITALS — BP 124/78 | HR 85 | Ht 68.0 in | Wt 214.0 lb

## 2017-05-04 DIAGNOSIS — Z6832 Body mass index (BMI) 32.0-32.9, adult: Secondary | ICD-10-CM

## 2017-05-04 DIAGNOSIS — F3289 Other specified depressive episodes: Secondary | ICD-10-CM | POA: Diagnosis not present

## 2017-05-04 DIAGNOSIS — I10 Essential (primary) hypertension: Secondary | ICD-10-CM | POA: Diagnosis not present

## 2017-05-04 DIAGNOSIS — G4733 Obstructive sleep apnea (adult) (pediatric): Secondary | ICD-10-CM | POA: Diagnosis not present

## 2017-05-04 MED ORDER — AMBULATORY NON FORMULARY MEDICATION: 0 refills | Status: DC

## 2017-05-04 NOTE — Progress Notes (Addendum)
Subjective:    Patient ID: Kathy Clark, female    DOB: [birth year], 56 y.o.   MRN: [id number]  HPI 56 year old female is here today to follow-up on the use of CPAP for her obstructive sleep apnea.  We were able to receive her download report about 4 or 5 weeks ago.  The  Compliance report showed she checked of is currently set at 12 cm water pressure which looks fantastic. Her AHI is 1.7 on this which means it is therapeutic. And she's been able to wear it for over 7 hours consistently which is fantastic.  She would actually like to try dropping her pressure down just slightly.  She still having a lot of problems with leakage with her mask.  She is actually tried 3 different masks.  [facility] and says that they have been great to work with so far.  Hypertension- Pt denies chest pain, SOB, dizziness, or heart palpitations.  Taking meds as directed w/o problems.  Denies medication side effects.    Depression-overall she is doing well.  She is still on the [place].  Wellbutrin was recently added more so to help with cravings with food but she is done very well with it and it has really made a difference.   Review of Systems  BP 124/78   Pulse 85   Ht 5\' 8"  (1.727 m)   Wt 214 lb (97.1 kg)   LMP  (LMP Unknown) Comment: hystrectomy  SpO2 98%   BMI 32.54 kg/m     Allergies  Allergen Reactions  . Succinylcholine Anaphylaxis  . Dilaudid [Hydromorphone Hcl] Nausea And Vomiting    Past Medical History:  Diagnosis Date  . Anxiety   . Back pain   . 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 Depression   . Essential hypertension, benign   . Gastritis   . GERD (gastroesophageal reflux disease)   . Hypertension   . Joint pain   . Lymphocytic colitis    microscopic  . Migraines   . Osteoarthritis   . Sciatica    right leg  . Swelling    feet, left foot    Past Surgical History:  Procedure Laterality Date  . ABDOMINAL  HYSTERECTOMY  07-2005  . [place] Bunionectomy Left 09/18/2016   Left Foot  . BACK SURGERY    . KNEE SURGERY  2006   left; multiple  . microdiscetomy  2005  . SHOULDER SURGERY  07-22-09   right  . SKIN CANCER EXCISION     eyelid  . TOTAL KNEE ARTHROPLASTY  07/27/2011   Procedure: TOTAL KNEE ARTHROPLASTY;  Surgeon: Gearlean Alf;  Location: [facility];  Service: Orthopedics;  Laterality: Left;    Social History   Social History  . Marital status: Married    Spouse name: Jenny Reichmann  . Number of children: 2  . Years of education: N/A   Occupational History  . RN [employer]   Social History Main Topics  . Smoking status: Never Smoker  . Smokeless tobacco: Never Used  . Alcohol use Yes     Comment: occasionally  . Drug use: No  . 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   Other Topics Concern  . Not on file   Social History Narrative  . No narrative on file    Family History  Problem Relation Age of Onset  .  Breast cancer Unknown   . Tongue cancer Mother   . Breast cancer Mother   . Anxiety disorder Mother   . Depression Mother   . Heart disease Father   . Hypertension Father   . Hyperlipidemia Father   . Stroke Paternal Grandmother   . Colon cancer Neg Hx     Outpatient Encounter Prescriptions as of 05/04/2017  Medication Sig  . AMBULATORY NON FORMULARY MEDICATION Medication Name: Cpap set to 12 cm water pressure. Humidified, with mask tubing, etc. Dx: Moderate OSA with AHI = 18.4. Please fax to [place] Please send download on 03/22/2016  . buPROPion (WELLBUTRIN SR) 150 MG 12 hr tablet Take 1 tablet (150 mg total) by mouth daily.  [name] estradiol (VIVELLE-DOT) 0.075 MG/24HR   . gabapentin (NEURONTIN) 300 MG capsule Take 300 mg by mouth 2 (two) times daily.   . hydrochlorothiazide (MICROZIDE) 12.5 MG capsule Take 1 capsule (12.5 mg total) by mouth daily.  [name] losartan (COZAAR) 25 MG tablet Take 1 tablet (25 mg  total) by mouth daily.  . Melatonin 3 MG TABS Take by mouth as directed.  . Multiple Vitamins-Minerals (MULTIVITAMIN ADULT PO) Take by mouth daily.  . SUMAtriptan (IMITREX) 20 MG/ACT nasal spray PLACE 1 SPRAY INTO THE NOSE EVERY 2 HOURS AS NEEDED FOR. HEADACHE  . VIIBRYD 20 MG TABS TAKE 1 TABLET (20 MG TOTAL) BY MOUTH DAILY.  [name] Vitamin D, Ergocalciferol, (DRISDOL) 50000 units CAPS capsule Take 1 capsule (50,000 Units total) by mouth every 7 (seven) days.  . AMBULATORY NON FORMULARY MEDICATION Medication Name: Please decrease CPAP setting to 10 cm of water pressure and fax [place] a download after 10 days.  Is having a lot of difficulty with air leaks is working to try to reduce her pressure to see if she is still being adequately treated but with fewer leaks.   No facility-administered encounter medications on file as of 05/04/2017.          Objective:   Physical Exam  Constitutional: She is oriented to person, place, and time. She appears well-developed and well-nourished.  HENT:  Head: Normocephalic and atraumatic.  Cardiovascular: Normal rate, regular rhythm and normal heart sounds.   Pulmonary/Chest: Effort normal and breath sounds normal.  Neurological: She is alert and oriented to person, place, and time.  Skin: Skin is warm and dry.  Psychiatric: She has a normal mood and affect. Her behavior is normal.        Assessment & Plan:  Obstructive sleep apnea on CPAP-we will try decreasing CPAP down to 10 cm water pressure.  Will send [facility].  We can get a download in about 1-2 weeks on the new pressure and hopefully this will improve the leaks that she has been experiencing.  She really has worked very hard to be consistent with her CPAP.  HTN - Well controlled. Continue current regimen. Follow up in  6 months.    Depression-we will continue with current regimen and continue to monitor every 6 months.  BMI 32-working with the bariatric clinic and doing  really well.  She is feeling very positive about her success so far.  She is currently on Wellbutrin to help curb her appetite.  She would ultimately like to weigh around 170 pounds but would be happy even at 190 pounds.

## 2017-05-05 ENCOUNTER — Ambulatory Visit (INDEPENDENT_AMBULATORY_CARE_PROVIDER_SITE_OTHER): Payer: 59 | Admitting: Physician Assistant

## 2017-05-06 ENCOUNTER — Ambulatory Visit (INDEPENDENT_AMBULATORY_CARE_PROVIDER_SITE_OTHER): Payer: 59 | Admitting: Physician Assistant

## 2017-05-06 VITALS — BP 124/78 | HR 67 | Temp 97.6°F | Ht 65.0 in | Wt 211.0 lb

## 2017-05-06 DIAGNOSIS — E669 Obesity, unspecified: Secondary | ICD-10-CM | POA: Diagnosis not present

## 2017-05-06 DIAGNOSIS — F3289 Other specified depressive episodes: Secondary | ICD-10-CM

## 2017-05-06 DIAGNOSIS — Z9189 Other specified personal risk factors, not elsewhere classified: Secondary | ICD-10-CM

## 2017-05-06 DIAGNOSIS — E559 Vitamin D deficiency, unspecified: Secondary | ICD-10-CM | POA: Diagnosis not present

## 2017-05-06 DIAGNOSIS — Z6832 Body mass index (BMI) 32.0-32.9, adult: Secondary | ICD-10-CM | POA: Diagnosis not present

## 2017-05-06 MED ORDER — VITAMIN D (ERGOCALCIFEROL) 1.25 MG (50000 UNIT) PO CAPS: 50000.0000 [IU] | ORAL_CAPSULE | ORAL | 0 refills | Status: DC

## 2017-05-06 MED ORDER — BUPROPION HCL ER (SR) 150 MG PO TB12: 150.0000 mg | ORAL_TABLET | Freq: Every day | ORAL | 0 refills | Status: DC

## 2017-05-06 MED FILL — VIT D2 1.25 MG (50,000 UNIT: 1.25 MG | 28 days supply | Qty: 4 | Fill #0

## 2017-05-06 MED FILL — BUPROPION SR 150 MG TABLET: 150 | 30 days supply | Qty: 30 | Fill #0

## 2017-05-06 NOTE — Progress Notes (Signed)
Office: [phone]  /  Fax: [phone]   HPI:   Chief Complaint: OBESITY Kathy Clark is here to discuss her progress with her obesity treatment plan. She is on the keep a food journal with 1550 calories and 100 grams of protein daily and is following her eating plan approximately 90 % of the time. She states she is walking 30 minutes 5 times per week. Kathy Clark continues to do well with weight loss. She has been planning her meals better and she states her hunger is well controlled.  Her weight is 211 lb (95.7 kg) today and has had a weight loss of 1 pound over a period of 2 weeks since her last visit. She has lost 26 lbs since starting treatment with [place].  Vitamin D deficiency Kathy Clark has a diagnosis of vitamin D deficiency. She is currently taking prescription Vit D and denies nausea, vomiting or muscle weakness.  At risk for osteopenia and osteoporosis Kathy Clark is at higher risk of osteopenia and osteoporosis due to vitamin D deficiency.   Depression with emotional eating behaviors Kathy Clark is struggling with emotional eating and using food for comfort to the extent that it is negatively impacting her health. She often snacks when she is not hungry. Kathy Clark sometimes feels she is out of control and then feels guilty that she made poor food choices. She has been working on behavior modification techniques to help reduce her emotional eating and has been somewhat successful. Her mood is stable and she shows no sign of suicidal or homicidal ideations.  Depression screen Kathy Clark 2/9 11/16/2016 08/03/2016 01/21/2016 08/26/2015 07/11/2015  Decreased Interest 2 3 0 1 1  Down, Depressed, Hopeless 2 3 0 1 2  PHQ - 2 Score 4 6 0 2 3  Altered sleeping 3 3 2 2 2   Tired, decreased energy 3 3 2 2 2   Change in appetite 1 3 1  0 2  Feeling bad or failure about yourself  1 3 0 0 1  Trouble concentrating 2 2 1  0 1  Moving slowly or fidgety/restless 1 0 0 0 0  Suicidal thoughts 0 0 0 0 0  PHQ-9 Score 15 20 6 6 11   Difficult  doing work/chores - - Somewhat difficult Somewhat difficult Somewhat difficult   ALLERGIES: Allergies  Allergen Reactions  . Succinylcholine Anaphylaxis  . Dilaudid [Hydromorphone Hcl] Nausea And Vomiting    MEDICATIONS: Current Outpatient Prescriptions on File Prior to Visit  Medication Sig Dispense Refill  . AMBULATORY NON FORMULARY MEDICATION Medication Name: Cpap set to 12 cm water pressure. Humidified, with mask tubing, etc. Dx: Moderate OSA with AHI = 18.4. Please fax to [facility] Please send download on 03/22/2016 1 Units 0  . AMBULATORY NON FORMULARY MEDICATION Medication Name: Please decrease CPAP setting to 10 cm of water pressure and fax [place] a download after 10 days.  Is having a lot of difficulty with air leaks is working to try to reduce her pressure to see if she is still being adequately treated but with fewer leaks. 1 vial 0  . buPROPion (WELLBUTRIN SR) 150 MG 12 hr tablet Take 1 tablet (150 mg total) by mouth daily. 30 tablet 0  . estradiol (VIVELLE-DOT) 0.075 MG/24HR   3  . gabapentin (NEURONTIN) 300 MG capsule Take 300 mg by mouth 2 (two) times daily.     . hydrochlorothiazide (MICROZIDE) 12.5 MG capsule Take 1 capsule (12.5 mg total) by mouth daily. 90 capsule 1  . losartan (COZAAR) 25 MG tablet Take  1 tablet (25 mg total) by mouth daily. 90 tablet 3  . Melatonin 3 MG TABS Take by mouth as directed.    . Multiple Vitamins-Minerals (MULTIVITAMIN ADULT PO) Take by mouth daily.    . SUMAtriptan (IMITREX) 20 MG/ACT nasal spray PLACE 1 SPRAY INTO THE NOSE EVERY 2 HOURS AS NEEDED FOR. HEADACHE 6 Inhaler 2  . VIIBRYD 20 MG TABS TAKE 1 TABLET (20 MG TOTAL) BY MOUTH DAILY. 90 tablet 2  . Vitamin D, Ergocalciferol, (DRISDOL) 50000 units CAPS capsule Take 1 capsule (50,000 Units total) by mouth every 7 (seven) days. 4 capsule 0  . [DISCONTINUED] rivaroxaban (XARELTO) 10 MG TABS tablet Take 1 tablet (10 mg total) by mouth daily with breakfast. 18 tablet 0   No current  facility-administered medications on file prior to visit.     PAST MEDICAL HISTORY: Past Medical History:  Diagnosis Date  . Anxiety   . Back pain   . 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 Depression   . Essential hypertension, benign   . Gastritis   . GERD (gastroesophageal reflux disease)   . Hypertension   . Joint pain   . Lymphocytic colitis    microscopic  . Migraines   . Osteoarthritis   . Sciatica    right leg  . Swelling    feet, left foot    PAST SURGICAL HISTORY: Past Surgical History:  Procedure Laterality Date  . ABDOMINAL HYSTERECTOMY  07-2005  . [place] Bunionectomy Left 09/18/2016   Left Foot  . BACK SURGERY    . KNEE SURGERY  2006   left; multiple  . microdiscetomy  2005  . SHOULDER SURGERY  07-22-09   right  . SKIN CANCER EXCISION     eyelid  . TOTAL KNEE ARTHROPLASTY  07/27/2011   Procedure: TOTAL KNEE ARTHROPLASTY;  Surgeon: Gearlean Alf;  Location: [facility];  Service: Orthopedics;  Laterality: Left;    SOCIAL HISTORY: Social History  Substance Use Topics  . Smoking status: Never Smoker  . Smokeless tobacco: Never Used  . Alcohol use Yes     Comment: occasionally    FAMILY HISTORY: Family History  Problem Relation Age of Onset  . Breast cancer Unknown   . Tongue cancer Mother   . Breast cancer Mother   . Anxiety disorder Mother   . Depression Mother   . Heart disease Father   . Hypertension Father   . Hyperlipidemia Father   . Stroke Paternal Grandmother   . Colon cancer Neg Hx     ROS: Review of Systems  Constitutional: Positive for weight loss.  Gastrointestinal: Negative for nausea and vomiting.  Musculoskeletal:       Negative muscle weakness  Psychiatric/Behavioral: Positive for depression. Negative for suicidal ideas.    PHYSICAL EXAM: Blood pressure 124/78, pulse 67, temperature 97.6 F (36.4 C), temperature source Oral, height 5\' 5"  (1.651 m), weight 211 lb (95.7 kg), SpO2 98 %. Body mass  index is 35.11 kg/m. Physical Exam  Constitutional: She is oriented to person, place, and time. She appears well-developed and well-nourished.  Cardiovascular: Normal rate.   Pulmonary/Chest: Effort normal.  Musculoskeletal: Normal range of motion.  Neurological: She is oriented to person, place, and time.  Skin: Skin is warm and dry.  Psychiatric: She has a normal mood and affect. Her behavior is normal.  Vitals reviewed.   RECENT LABS AND TESTS: BMET    Component Value Date/Time   NA 142 03/10/2017 3235  K 4.5 03/10/2017 0828   CL 99 03/10/2017 0828   CO2 24 03/10/2017 0828   GLUCOSE 78 03/10/2017 0828   GLUCOSE 79 03/26/2016 1600   BUN 14 03/10/2017 0828   CREATININE 0.78 03/10/2017 0828   CREATININE 0.61 03/26/2016 1600   CALCIUM 9.7 03/10/2017 0828   GFRNONAA 85 03/10/2017 0828   GFRNONAA >89 03/26/2016 1600   GFRAA 98 03/10/2017 0828   GFRAA >89 03/26/2016 1600   Lab Results  Component Value Date   HGBA1C 5.1 12/01/2016   HGBA1C 4.9 08/03/2016   Lab Results  Component Value Date   INSULIN 5.4 12/01/2016   INSULIN 5.9 08/03/2016   CBC    Component Value Date/Time   WBC 6.4 08/03/2016 1458   WBC 6.7 03/26/2016 1600   RBC 4.56 08/03/2016 1458   RBC 4.68 03/26/2016 1600   HGB 13.6 08/03/2016 1458   HCT 41.3 08/03/2016 1458   PLT 287 03/26/2016 1600   MCV 91 08/03/2016 1458   MCH 29.8 08/03/2016 1458   MCH 30.6 03/26/2016 1600   MCHC 32.9 08/03/2016 1458   MCHC 33.6 03/26/2016 1600   RDW 12.9 08/03/2016 1458   LYMPHSABS 1.8 08/03/2016 1458   MONOABS 469 03/26/2016 1600   EOSABS 0.2 08/03/2016 1458   BASOSABS 0.0 08/03/2016 1458   Iron/TIBC/Ferritin/ %Sat    Component Value Date/Time   FERRITIN 142 12/25/2015 0912   Lipid Panel     Component Value Date/Time   CHOL 190 03/10/2017 0828   TRIG 83 03/10/2017 0828   HDL 61 03/10/2017 0828   CHOLHDL 3.0 04/05/2015 0937   VLDL 21 04/05/2015 0937   LDLCALC 112 (H) 03/10/2017 0828   Hepatic  Function Panel     Component Value Date/Time   PROT 6.6 03/10/2017 0828   ALBUMIN 4.2 03/10/2017 0828   AST 16 03/10/2017 0828   ALT 22 03/10/2017 0828   ALKPHOS 50 03/10/2017 0828   BILITOT 0.3 03/10/2017 0828   BILIDIR 0.1 12/25/2015 0912   IBILI 0.5 12/25/2015 0912      Component Value Date/Time   TSH 1.470 08/03/2016 1458   TSH 2.09 03/26/2016 1600   TSH 3.10 12/25/2015 0912    ASSESSMENT AND PLAN: Vitamin D deficiency - Plan: Vitamin D, Ergocalciferol, (DRISDOL) 50000 units CAPS capsule  Other depression - with emotional eating - Plan: buPROPion (WELLBUTRIN SR) 150 MG 12 hr tablet  At risk for osteoporosis  Class 1 obesity with serious comorbidity and body mass index (BMI) of 32.0 to 32.9 in adult, unspecified obesity type  PLAN:  Vitamin D Deficiency Kathy Clark was informed that low vitamin D levels contributes to fatigue and are associated with obesity, breast, and colon cancer. Kathy Clark agrees to continue taking prescription Vit D @50 ,000 IU every week #4 and we will refill for 1 month. She will follow up for routine testing of vitamin D, at least 2-3 times per year. She was informed of the risk of over-replacement of vitamin D and agrees to not increase her dose unless he discusses this with [place] first. Kathy Clark agrees to follow up with our clinic in 3 weeks.  At risk for osteopenia and osteoporosis Kathy Clark is at risk for osteopenia and osteoporsis due to her vitamin D deficiency. She was encouraged to take her vitamin D and follow her higher calcium diet and increase strengthening exercise to help strengthen her bones and decrease her risk of osteopenia and osteoporosis.  Depression with Emotional Eating Behaviors We discussed behavior modification techniques today to help  Kathy Clark deal with her emotional eating and depression. Kathy Clark agrees to continue taking Wellbutrin SR 150 mg qd #30 and we will refill for 1 month. Kathy Clark agrees to follow up with our clinic in 3 weeks.    Obesity Kathy Clark is currently in the action stage of change. As such, her goal is to continue with weight loss efforts She has agreed to keep a food journal with 1550 calories and 100 grams of protein daily Kathy Clark has been instructed to work up to a goal of 150 minutes of combined cardio and strengthening exercise per week for weight loss and overall health benefits. We discussed the following Behavioral Modification Strategies today: increasing lean protein intake and work on meal planning and easy cooking plans   Kathy Clark has agreed to follow up with our clinic in 3 weeks. She was informed of the importance of frequent follow up visits to maximize her success with intensive lifestyle modifications for her multiple health conditions.  I, Trixie Dredge, am acting as transcriptionist for Kathy Duverney, PA-C  I have reviewed the above documentation for accuracy and completeness, and I agree with the above. -Kathy Duverney, PA-C  I have reviewed the above note and agree with the plan. -Dennard Nip, MD     Today's visit was # 19 out of 22.  Starting weight: 237 lbs Starting date: 08/03/16 Today's weight : 211 lbs  Today's date: 05/06/2017 Total lbs lost to date: 30 (Patients must lose 7 lbs in the first 6 months to continue with counseling)   ASK: We discussed the diagnosis of obesity with Marianne Sofia today and Jadamarie agreed to give [place] permission to discuss obesity behavioral modification therapy today.  ASSESS: Geselle has the diagnosis of obesity and her BMI today is 35.11 Vonette is in the action stage of change   ADVISE: Sherlon was educated on the multiple health risks of obesity as well as the benefit of weight loss to improve her health. She was advised of the need for long term treatment and the importance of lifestyle modifications.  AGREE: Multiple dietary modification options and treatment options were discussed and  Emmali agreed to keep a food journal with 1550 calories and  100 grams of protein daily We discussed the following Behavioral Modification Strategies today: increasing lean protein intake and work on meal planning and easy cooking plans

## 2017-05-14 ENCOUNTER — Other Ambulatory Visit: Payer: Self-pay | Admitting: Family Medicine

## 2017-05-14 MED FILL — GABAPENTIN 300 MG CAPSULE: 300 | 90 days supply | Qty: 180 | Fill #2

## 2017-05-14 MED FILL — LOSARTAN POTASSIUM 25 MG TA: 25 | 90 days supply | Qty: 90 | Fill #2

## 2017-05-14 MED FILL — HYDROCHLOROTHIAZIDE 12.5 MG: 12.5 | 90 days supply | Qty: 90 | Fill #0

## 2017-05-17 DIAGNOSIS — Z96659 Presence of unspecified artificial knee joint: Secondary | ICD-10-CM | POA: Diagnosis not present

## 2017-05-17 DIAGNOSIS — G4733 Obstructive sleep apnea (adult) (pediatric): Secondary | ICD-10-CM | POA: Diagnosis not present

## 2017-05-24 DIAGNOSIS — G4733 Obstructive sleep apnea (adult) (pediatric): Secondary | ICD-10-CM | POA: Diagnosis not present

## 2017-05-24 DIAGNOSIS — Z96659 Presence of unspecified artificial knee joint: Secondary | ICD-10-CM | POA: Diagnosis not present

## 2017-05-25 ENCOUNTER — Ambulatory Visit (INDEPENDENT_AMBULATORY_CARE_PROVIDER_SITE_OTHER): Payer: 59 | Admitting: Physician Assistant

## 2017-05-25 VITALS — BP 105/71 | HR 66 | Temp 97.9°F | Ht 68.0 in | Wt 215.0 lb

## 2017-05-25 DIAGNOSIS — E669 Obesity, unspecified: Secondary | ICD-10-CM | POA: Diagnosis not present

## 2017-05-25 DIAGNOSIS — Z6832 Body mass index (BMI) 32.0-32.9, adult: Secondary | ICD-10-CM | POA: Diagnosis not present

## 2017-05-25 DIAGNOSIS — Z9189 Other specified personal risk factors, not elsewhere classified: Secondary | ICD-10-CM

## 2017-05-25 DIAGNOSIS — F3289 Other specified depressive episodes: Secondary | ICD-10-CM

## 2017-05-25 DIAGNOSIS — E559 Vitamin D deficiency, unspecified: Secondary | ICD-10-CM

## 2017-05-25 MED ORDER — VITAMIN D (ERGOCALCIFEROL) 1.25 MG (50000 UNIT) PO CAPS: 50000.0000 [IU] | ORAL_CAPSULE | ORAL | 0 refills | Status: DC

## 2017-05-25 MED ORDER — BUPROPION HCL ER (SR) 150 MG PO TB12: 150.0000 mg | ORAL_TABLET | Freq: Every day | ORAL | 0 refills | Status: DC

## 2017-05-25 NOTE — Progress Notes (Signed)
Office: [phone]  /  Fax: [phone]   HPI:   Chief Complaint: OBESITY Kathy Clark is here to discuss her progress with her obesity treatment plan. She is on the keep a food journal with 1550 calories and 100 grams of protein daily and is following her eating plan approximately 70 % of the time. She states she is walking for 30 minutes 3 times per week. Bria has traveled more over past few weeks and has had increased celebration eating. She is motivated to get back on track and continue weight loss. She would like more meal planning ideas.  Her weight is 215 lb (97.5 kg) today and has gained 4 pounds since her last visit. She has lost 22 lbs since starting treatment with [place].  Vitamin D deficiency Mikia has a diagnosis of vitamin D deficiency. She is currently taking prescription Vit D and denies nausea, vomiting or muscle weakness.  At risk for osteopenia and osteoporosis Lachell is at higher risk of osteopenia and osteoporosis due to vitamin D deficiency.   Depression with emotional eating behaviors Marthann is struggling with emotional eating and using food for comfort to the extent that it is negatively impacting her health. She often snacks when she is not hungry. Corinthian sometimes feels she is out of control and then feels guilty that she made poor food choices. She has been working on behavior modification techniques to help reduce her emotional eating and has been somewhat successful. Her mood is stable and she shows no sign of suicidal or homicidal ideations.  Depression screen [facility] 2/9 11/16/2016 08/03/2016 01/21/2016 08/26/2015 07/11/2015  Decreased Interest 2 3 0 1 1  Down, Depressed, Hopeless 2 3 0 1 2  PHQ - 2 Score 4 6 0 2 3  Altered sleeping 3 3 2 2 2   Tired, decreased energy 3 3 2 2 2   Change in appetite 1 3 1  0 2  Feeling bad or failure about yourself  1 3 0 0 1  Trouble concentrating 2 2 1  0 1  Moving slowly or fidgety/restless 1 0 0 0 0  Suicidal thoughts 0 0 0 0 0  PHQ-9  Score 15 20 6 6 11   Difficult doing work/chores - - Somewhat difficult Somewhat difficult Somewhat difficult   ALLERGIES: Allergies  Allergen Reactions  . Succinylcholine Anaphylaxis  . Dilaudid [Hydromorphone Hcl] Nausea And Vomiting    MEDICATIONS: Current Outpatient Medications on File Prior to Visit  Medication Sig Dispense Refill  . AMBULATORY NON FORMULARY MEDICATION Medication Name: Cpap set to 12 cm water pressure. Humidified, with mask tubing, etc. Dx: Moderate OSA with AHI = 18.4. Please fax to [place] Please send download on 03/22/2016 1 Units 0  . AMBULATORY NON FORMULARY MEDICATION Medication Name: Please decrease CPAP setting to 10 cm of water pressure and fax [place] a download after 10 days.  Is having a lot of difficulty with air leaks is working to try to reduce her pressure to see if she is still being adequately treated but with fewer leaks. 1 vial 0  . buPROPion (WELLBUTRIN SR) 150 MG 12 hr tablet Take 1 tablet (150 mg total) by mouth daily. 30 tablet 0  . estradiol (VIVELLE-DOT) 0.075 MG/24HR   3  . gabapentin (NEURONTIN) 300 MG capsule Take 300 mg by mouth 2 (two) times daily.     . hydrochlorothiazide (MICROZIDE) 12.5 MG capsule TAKE 1 CAPSULE (12.5 MG TOTAL) BY MOUTH DAILY. 90 capsule 1  . losartan (COZAAR) 25 MG tablet  Take 1 tablet (25 mg total) by mouth daily. 90 tablet 3  . Melatonin 3 MG TABS Take by mouth as directed.    . Multiple Vitamins-Minerals (MULTIVITAMIN ADULT PO) Take by mouth daily.    . SUMAtriptan (IMITREX) 20 MG/ACT nasal spray PLACE 1 SPRAY INTO THE NOSE EVERY 2 HOURS AS NEEDED FOR. HEADACHE 6 Inhaler 2  . VIIBRYD 20 MG TABS TAKE 1 TABLET (20 MG TOTAL) BY MOUTH DAILY. 90 tablet 2  . Vitamin D, Ergocalciferol, (DRISDOL) 50000 units CAPS capsule Take 1 capsule (50,000 Units total) by mouth every 7 (seven) days. 4 capsule 0  . [DISCONTINUED] rivaroxaban (XARELTO) 10 MG TABS tablet Take 1 tablet (10 mg total) by mouth daily with breakfast. 18  tablet 0   No current facility-administered medications on file prior to visit.     PAST MEDICAL HISTORY: Past Medical History:  Diagnosis Date  . Anxiety   . Back pain   . 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 Depression   . Essential hypertension, benign   . Gastritis   . GERD (gastroesophageal reflux disease)   . Hypertension   . Joint pain   . Lymphocytic colitis    microscopic  . Migraines   . Osteoarthritis   . Sciatica    right leg  . Swelling    feet, left foot    PAST SURGICAL HISTORY: Past Surgical History:  Procedure Laterality Date  . ABDOMINAL HYSTERECTOMY  07-2005  . [place] Bunionectomy Left 09/18/2016   Left Foot  . BACK SURGERY    . KNEE SURGERY  2006   left; multiple  . microdiscetomy  2005  . SHOULDER SURGERY  07-22-09   right  . SKIN CANCER EXCISION     eyelid    SOCIAL HISTORY: Social History   Tobacco Use  . Smoking status: Never Smoker  . Smokeless tobacco: Never Used  Substance Use Topics  . Alcohol use: Yes    Comment: occasionally  . Drug use: No    FAMILY HISTORY: Family History  Problem Relation Age of Onset  . Breast cancer Unknown   . Tongue cancer Mother   . Breast cancer Mother   . Anxiety disorder Mother   . Depression Mother   . Heart disease Father   . Hypertension Father   . Hyperlipidemia Father   . Stroke Paternal Grandmother   . Colon cancer Neg Hx     ROS: Review of Systems  Constitutional: Negative for weight loss.  Gastrointestinal: Negative for nausea and vomiting.  Musculoskeletal:       Negative muscle weakness  Psychiatric/Behavioral: Positive for depression. Negative for suicidal ideas.    PHYSICAL EXAM: Blood pressure 105/71, pulse 66, temperature 97.9 F (36.6 C), temperature source Oral, height 5\' 8"  (1.727 m), weight 215 lb (97.5 kg), SpO2 97 %. Body mass index is 32.69 kg/m. Physical Exam  Constitutional: She is oriented to person, place, and time. She appears  well-developed and well-nourished.  Cardiovascular: Normal rate.  Pulmonary/Chest: Effort normal.  Musculoskeletal: Normal range of motion.  Neurological: She is oriented to person, place, and time.  Skin: Skin is warm and dry.  Psychiatric: She has a normal mood and affect.  Vitals reviewed.   RECENT LABS AND TESTS: BMET    Component Value Date/Time   NA 142 03/10/2017 0828   K 4.5 03/10/2017 0828   CL 99 03/10/2017 0828   CO2 24 03/10/2017 0828   GLUCOSE 78 03/10/2017 0828  GLUCOSE 79 03/26/2016 1600   BUN 14 03/10/2017 0828   CREATININE 0.78 03/10/2017 0828   CREATININE 0.61 03/26/2016 1600   CALCIUM 9.7 03/10/2017 0828   GFRNONAA 85 03/10/2017 0828   GFRNONAA >89 03/26/2016 1600   GFRAA 98 03/10/2017 0828   GFRAA >89 03/26/2016 1600   Lab Results  Component Value Date   HGBA1C 5.1 12/01/2016   HGBA1C 4.9 08/03/2016   Lab Results  Component Value Date   INSULIN 5.4 12/01/2016   INSULIN 5.9 08/03/2016   CBC    Component Value Date/Time   WBC 6.4 08/03/2016 1458   WBC 6.7 03/26/2016 1600   RBC 4.56 08/03/2016 1458   RBC 4.68 03/26/2016 1600   HGB 13.6 08/03/2016 1458   HCT 41.3 08/03/2016 1458   PLT 287 03/26/2016 1600   MCV 91 08/03/2016 1458   MCH 29.8 08/03/2016 1458   MCH 30.6 03/26/2016 1600   MCHC 32.9 08/03/2016 1458   MCHC 33.6 03/26/2016 1600   RDW 12.9 08/03/2016 1458   LYMPHSABS 1.8 08/03/2016 1458   MONOABS 469 03/26/2016 1600   EOSABS 0.2 08/03/2016 1458   BASOSABS 0.0 08/03/2016 1458   Iron/TIBC/Ferritin/ %Sat    Component Value Date/Time   FERRITIN 142 12/25/2015 0912   Lipid Panel     Component Value Date/Time   CHOL 190 03/10/2017 0828   TRIG 83 03/10/2017 0828   HDL 61 03/10/2017 0828   CHOLHDL 3.0 04/05/2015 0937   VLDL 21 04/05/2015 0937   LDLCALC 112 (H) 03/10/2017 0828   Hepatic Function Panel     Component Value Date/Time   PROT 6.6 03/10/2017 0828   ALBUMIN 4.2 03/10/2017 0828   AST 16 03/10/2017 0828   ALT 22  03/10/2017 0828   ALKPHOS 50 03/10/2017 0828   BILITOT 0.3 03/10/2017 0828   BILIDIR 0.1 12/25/2015 0912   IBILI 0.5 12/25/2015 0912      Component Value Date/Time   TSH 1.470 08/03/2016 1458   TSH 2.09 03/26/2016 1600   TSH 3.10 12/25/2015 0912    ASSESSMENT AND PLAN: Vitamin D deficiency - Plan: Vitamin D, Ergocalciferol, (DRISDOL) 50000 units CAPS capsule  Other depression - with emotional eating - Plan: buPROPion (WELLBUTRIN SR) 150 MG 12 hr tablet  At risk for osteoporosis  Class 1 obesity with serious comorbidity and body mass index (BMI) of 32.0 to 32.9 in adult, unspecified obesity type  PLAN:  Vitamin D Deficiency Zakyria was informed that low vitamin D levels contributes to fatigue and are associated with obesity, breast, and colon cancer. Yer agrees to continue taking prescription Vit D @50 ,000 IU every week #4 and we will refill for 1 month. She follow up for routine testing of vitamin D, at least 2-3 times per year. She was informed of the risk of over-replacement of vitamin D and agrees to not increase her dose unless he discusses this with [place] first. Hena agrees to follow up with our clinic in 3 weeks.  At risk for osteopenia and osteoporosis Ahnika is at risk for osteopenia and osteoporsis due to her vitamin D deficiency. She was encouraged to take her vitamin D and follow her higher calcium diet and increase strengthening exercise to help strengthen her bones and decrease her risk of osteopenia and osteoporosis.  Depression with Emotional Eating Behaviors We discussed behavior modification techniques today to help Adrea deal with her emotional eating and depression. Chanon agrees to continue taking Wellbutrin SR 150 mg qd #30 and we will refill for 1  month. Layann agrees to follow up with our clinic in 3 weeks.   Obesity Leslee is currently in the action stage of change. As such, her goal is to continue with weight loss efforts She has agreed to change to keep a food  journal with 1500 calories and 90 grams of protein daily Meris has been instructed to work up to a goal of 150 minutes of combined cardio and strengthening exercise per week for weight loss and overall health benefits. We discussed the following Behavioral Modification Strategies today: increasing lean protein intake and work on meal planning and easy cooking plans   Mazikeen has agreed to follow up with our clinic in 3 weeks. She was informed of the importance of frequent follow up visits to maximize her success with intensive lifestyle modifications for her multiple health conditions.  I, Trixie Dredge, am acting as transcriptionist for Lacy Duverney, PA-C  I have reviewed the above documentation for accuracy and completeness, and I agree with the above. -Lacy Duverney, PA-C  I have reviewed the above note and agree with the plan. -Dennard Nip, MD      Today's visit was # 20 out of 22.  Starting weight: 237 lbs Starting date: 08/03/16 Today's weight : 215 lbs  Today's date: 05/25/2017 Total lbs lost to date: 22 (Patients must lose 7 lbs in the first 6 months to continue with counseling)   ASK: We discussed the diagnosis of obesity with Marianne Sofia today and Duru agreed to give [place] permission to discuss obesity behavioral modification therapy today.  ASSESS: Trayonna has the diagnosis of obesity and her BMI today is 32.7 Aryanah is in the action stage of change   ADVISE: Keelie was educated on the multiple health risks of obesity as well as the benefit of weight loss to improve her health. She was advised of the need for long term treatment and the importance of lifestyle modifications.  AGREE: Multiple dietary modification options and treatment options were discussed and  Dicy agreed to keep a food journal with 1500 calories and 90 grams of protein daily We discussed the following Behavioral Modification Strategies today: increasing lean protein intake and work on meal planning  and easy cooking plans

## 2017-05-26 DIAGNOSIS — Z6833 Body mass index (BMI) 33.0-33.9, adult: Secondary | ICD-10-CM | POA: Diagnosis not present

## 2017-05-26 DIAGNOSIS — Z01419 Encounter for gynecological examination (general) (routine) without abnormal findings: Secondary | ICD-10-CM | POA: Diagnosis not present

## 2017-05-27 DIAGNOSIS — Z96659 Presence of unspecified artificial knee joint: Secondary | ICD-10-CM | POA: Diagnosis not present

## 2017-05-27 DIAGNOSIS — G4733 Obstructive sleep apnea (adult) (pediatric): Secondary | ICD-10-CM | POA: Diagnosis not present

## 2017-06-02 DIAGNOSIS — M79641 Pain in right hand: Secondary | ICD-10-CM | POA: Diagnosis not present

## 2017-06-02 DIAGNOSIS — M79642 Pain in left hand: Secondary | ICD-10-CM | POA: Diagnosis not present

## 2017-06-08 ENCOUNTER — Encounter: Payer: Self-pay | Admitting: Family Medicine

## 2017-06-08 ENCOUNTER — Other Ambulatory Visit: Payer: Self-pay

## 2017-06-08 MED ORDER — AMBULATORY NON FORMULARY MEDICATION: 0 refills | Status: DC

## 2017-06-08 MED FILL — BUPROPION SR 150 MG TABLET: 150 | 30 days supply | Qty: 30 | Fill #0

## 2017-06-09 ENCOUNTER — Telehealth: Payer: Self-pay | Admitting: Family Medicine

## 2017-06-09 NOTE — Telephone Encounter (Signed)
Please call patient: [facility] She is using the CPAP consistently and it is set at 10 cm water pressure. Her AHI looks fantastic and 0.4. Everything looks great we'll continue with current regimen.

## 2017-06-09 NOTE — Telephone Encounter (Signed)
Patient advised of results and recommendations.  

## 2017-06-15 ENCOUNTER — Ambulatory Visit (INDEPENDENT_AMBULATORY_CARE_PROVIDER_SITE_OTHER): Payer: 59 | Admitting: Physician Assistant

## 2017-06-15 VITALS — BP 111/75 | HR 64 | Temp 98.0°F | Ht 68.0 in | Wt 217.0 lb

## 2017-06-15 DIAGNOSIS — E669 Obesity, unspecified: Secondary | ICD-10-CM | POA: Diagnosis not present

## 2017-06-15 DIAGNOSIS — Z9189 Other specified personal risk factors, not elsewhere classified: Secondary | ICD-10-CM | POA: Diagnosis not present

## 2017-06-15 DIAGNOSIS — Z6833 Body mass index (BMI) 33.0-33.9, adult: Secondary | ICD-10-CM | POA: Diagnosis not present

## 2017-06-15 DIAGNOSIS — F3289 Other specified depressive episodes: Secondary | ICD-10-CM | POA: Diagnosis not present

## 2017-06-15 DIAGNOSIS — E559 Vitamin D deficiency, unspecified: Secondary | ICD-10-CM | POA: Diagnosis not present

## 2017-06-15 MED ORDER — VITAMIN D (ERGOCALCIFEROL) 1.25 MG (50000 UNIT) PO CAPS: 50000.0000 [IU] | ORAL_CAPSULE | ORAL | 0 refills | Status: DC

## 2017-06-15 MED ORDER — BUPROPION HCL ER (SR) 150 MG PO TB12: 150.0000 mg | ORAL_TABLET | Freq: Every day | ORAL | 0 refills | Status: DC

## 2017-06-15 NOTE — Progress Notes (Signed)
Office: [phone]  /  Fax: [phone]   HPI:   Chief Complaint: OBESITY Kathy Clark is here to discuss her progress with her obesity treatment plan. She is on the keep a food journal with 1500 calories and 90 grams of protein daily and is following her eating plan approximately 50 % of the time. She states she is walking for 30 minutes 5 to 7 times per week. Kathy Clark is mindful of her eating, but has challenges planning her meals ahead of time. She would like a different meal plan. Her weight is 217 lb (98.4 kg) today and has had a weight gain of 2 pounds over a period of 3 weeks since her last visit. She has lost 20 lbs since starting treatment with [place].  Vitamin D deficiency Kathy Clark has a diagnosis of vitamin D deficiency. She is currently taking vit D and denies nausea, vomiting or muscle weakness.  At risk for osteopenia and osteoporosis Kathy Clark is at higher risk of osteopenia and osteoporosis due to vitamin D deficiency.   Depression with emotional eating behaviors Kathy Clark is struggling with emotional eating and using food for comfort to the extent that it is negatively impacting her health. She often snacks when she is not hungry. Kathy Clark sometimes feels she is out of control and then feels guilty that she made poor food choices. She has been working on behavior modification techniques to help reduce her emotional eating and has been somewhat successful. Her mood is stable and she shows no sign of suicidal or homicidal ideations.  Depression screen Kathy Clark 2/9 11/16/2016 08/03/2016 01/21/2016 08/26/2015 07/11/2015  Decreased Interest 2 3 0 1 1  Down, Depressed, Hopeless 2 3 0 1 2  PHQ - 2 Score 4 6 0 2 3  Altered sleeping 3 3 2 2 2   Tired, decreased energy 3 3 2 2 2   Change in appetite 1 3 1  0 2  Feeling bad or failure about yourself  1 3 0 0 1  Trouble concentrating 2 2 1  0 1  Moving slowly or fidgety/restless 1 0 0 0 0  Suicidal thoughts 0 0 0 0 0  PHQ-9 Score 15 20 6 6 11   Difficult doing  work/chores - - Somewhat difficult Somewhat difficult Somewhat difficult      ALLERGIES: Allergies  Allergen Reactions  . Succinylcholine Anaphylaxis  . Dilaudid [Hydromorphone Hcl] Nausea And Vomiting    MEDICATIONS: Current Outpatient Medications on File Prior to Visit  Medication Sig Dispense Refill  . AMBULATORY NON FORMULARY MEDICATION Medication Name: Cpap set to 12 cm water pressure. Humidified, with mask tubing, etc. Dx: Moderate OSA with AHI = 18.4. Please fax to [place] Please send download on 03/22/2016 1 Units 0  . AMBULATORY NON FORMULARY MEDICATION Medication Name: Please decrease CPAP setting to 10 cm of water pressure and fax [place] a download after 10 days.  Is having a lot of difficulty with air leaks is working to try to reduce her pressure to see if she is still being adequately treated but with fewer leaks. 1 vial 0  . AMBULATORY NON FORMULARY MEDICATION CPAP download. Fax to [place] [facility]. 1 each 0  . buPROPion (WELLBUTRIN SR) 150 MG 12 hr tablet Take 1 tablet (150 mg total) daily by mouth. 30 tablet 0  . estradiol (VIVELLE-DOT) 0.075 MG/24HR   3  . gabapentin (NEURONTIN) 300 MG capsule Take 300 mg by mouth 2 (two) times daily.     . hydrochlorothiazide (MICROZIDE) 12.5 MG capsule TAKE 1 CAPSULE (  12.5 MG TOTAL) BY MOUTH DAILY. 90 capsule 1  . losartan (COZAAR) 25 MG tablet Take 1 tablet (25 mg total) by mouth daily. 90 tablet 3  . Melatonin 3 MG TABS Take by mouth as directed.    . Multiple Vitamins-Minerals (MULTIVITAMIN ADULT PO) Take by mouth daily.    . SUMAtriptan (IMITREX) 20 MG/ACT nasal spray PLACE 1 SPRAY INTO THE NOSE EVERY 2 HOURS AS NEEDED FOR. HEADACHE 6 Inhaler 2  . VIIBRYD 20 MG TABS TAKE 1 TABLET (20 MG TOTAL) BY MOUTH DAILY. 90 tablet 2  . Vitamin D, Ergocalciferol, (DRISDOL) 50000 units CAPS capsule Take 1 capsule (50,000 Units total) every 7 (seven) days by mouth. 4 capsule 0  . [DISCONTINUED] rivaroxaban (XARELTO) 10 MG TABS tablet Take 1 tablet (10  mg total) by mouth daily with breakfast. 18 tablet 0   No current facility-administered medications on file prior to visit.     PAST MEDICAL HISTORY: Past Medical History:  Diagnosis Date  . Anxiety   . Back pain   . 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 Depression   . Essential hypertension, benign   . Gastritis   . GERD (gastroesophageal reflux disease)   . Hypertension   . Joint pain   . Lymphocytic colitis    microscopic  . Migraines   . Osteoarthritis   . Sciatica    right leg  . Swelling    feet, left foot    PAST SURGICAL HISTORY: Past Surgical History:  Procedure Laterality Date  . ABDOMINAL HYSTERECTOMY  07-2005  . [place] Bunionectomy Left 09/18/2016   Left Foot  . BACK SURGERY    . KNEE SURGERY  2006   left; multiple  . microdiscetomy  2005  . SHOULDER SURGERY  07-22-09   right  . SKIN CANCER EXCISION     eyelid  . TOTAL KNEE ARTHROPLASTY  07/27/2011   Procedure: TOTAL KNEE ARTHROPLASTY;  Surgeon: Gearlean Alf;  Location: [facility];  Service: Orthopedics;  Laterality: Left;    SOCIAL HISTORY: Social History   Tobacco Use  . Smoking status: Never Smoker  . Smokeless tobacco: Never Used  Substance Use Topics  . Alcohol use: Yes    Comment: occasionally  . Drug use: No    FAMILY HISTORY: Family History  Problem Relation Age of Onset  . Breast cancer Unknown   . Tongue cancer Mother   . Breast cancer Mother   . Anxiety disorder Mother   . Depression Mother   . Heart disease Father   . Hypertension Father   . Hyperlipidemia Father   . Stroke Paternal Grandmother   . Colon cancer Neg Hx     ROS: Review of Systems  Constitutional: Negative for weight loss.  Gastrointestinal: Negative for nausea and vomiting.  Musculoskeletal:       Negative muscle weakness  Psychiatric/Behavioral: Positive for depression. Negative for suicidal ideas.    PHYSICAL EXAM: Blood pressure 111/75, pulse 64, temperature 98 F (36.7  C), temperature source Oral, height 5\' 8"  (1.727 m), weight 217 lb (98.4 kg), SpO2 98 %. Body mass index is 32.99 kg/m. Physical Exam  Constitutional: She is oriented to person, place, and time. She appears well-developed and well-nourished.  Cardiovascular: Normal rate.  Pulmonary/Chest: Effort normal.  Musculoskeletal: Normal range of motion.  Neurological: She is oriented to person, place, and time.  Skin: Skin is warm and dry.  Psychiatric: She has a normal mood and affect. Her behavior is  normal.  Vitals reviewed.   RECENT LABS AND TESTS: BMET    Component Value Date/Time   NA 142 03/10/2017 0828   K 4.5 03/10/2017 0828   CL 99 03/10/2017 0828   CO2 24 03/10/2017 0828   GLUCOSE 78 03/10/2017 0828   GLUCOSE 79 03/26/2016 1600   BUN 14 03/10/2017 0828   CREATININE 0.78 03/10/2017 0828   CREATININE 0.61 03/26/2016 1600   CALCIUM 9.7 03/10/2017 0828   GFRNONAA 85 03/10/2017 0828   GFRNONAA >89 03/26/2016 1600   GFRAA 98 03/10/2017 0828   GFRAA >89 03/26/2016 1600   Lab Results  Component Value Date   HGBA1C 5.1 12/01/2016   HGBA1C 4.9 08/03/2016   Lab Results  Component Value Date   INSULIN 5.4 12/01/2016   INSULIN 5.9 08/03/2016   CBC    Component Value Date/Time   WBC 6.4 08/03/2016 1458   WBC 6.7 03/26/2016 1600   RBC 4.56 08/03/2016 1458   RBC 4.68 03/26/2016 1600   HGB 13.6 08/03/2016 1458   HCT 41.3 08/03/2016 1458   PLT 287 03/26/2016 1600   MCV 91 08/03/2016 1458   MCH 29.8 08/03/2016 1458   MCH 30.6 03/26/2016 1600   MCHC 32.9 08/03/2016 1458   MCHC 33.6 03/26/2016 1600   RDW 12.9 08/03/2016 1458   LYMPHSABS 1.8 08/03/2016 1458   MONOABS 469 03/26/2016 1600   EOSABS 0.2 08/03/2016 1458   BASOSABS 0.0 08/03/2016 1458   Iron/TIBC/Ferritin/ %Sat    Component Value Date/Time   FERRITIN 142 12/25/2015 0912   Lipid Panel     Component Value Date/Time   CHOL 190 03/10/2017 0828   TRIG 83 03/10/2017 0828   HDL 61 03/10/2017 0828   CHOLHDL  3.0 04/05/2015 0937   VLDL 21 04/05/2015 0937   LDLCALC 112 (H) 03/10/2017 0828   Hepatic Function Panel     Component Value Date/Time   PROT 6.6 03/10/2017 0828   ALBUMIN 4.2 03/10/2017 0828   AST 16 03/10/2017 0828   ALT 22 03/10/2017 0828   ALKPHOS 50 03/10/2017 0828   BILITOT 0.3 03/10/2017 0828   BILIDIR 0.1 12/25/2015 0912   IBILI 0.5 12/25/2015 0912      Component Value Date/Time   TSH 1.470 08/03/2016 1458   TSH 2.09 03/26/2016 1600   TSH 3.10 12/25/2015 0912    ASSESSMENT AND PLAN: Vitamin D deficiency - Plan: Vitamin D, Ergocalciferol, (DRISDOL) 50000 units CAPS capsule  At risk for osteoporosis  Other depression - with emotional eating - Plan: buPROPion (WELLBUTRIN SR) 150 MG 12 hr tablet  Class 1 obesity with serious comorbidity and body mass index (BMI) of 33.0 to 33.9 in adult, unspecified obesity type  PLAN:  Vitamin D Deficiency Kathy Clark was informed that low vitamin D levels contributes to fatigue and are associated with obesity, breast, and colon cancer. She agrees to continue to take prescription Vit D @50 ,000 IU every week #4 with no refills and will follow up for routine testing of vitamin D, at least 2-3 times per year. She was informed of the risk of over-replacement of vitamin D and agrees to not increase her dose unless he discusses this with [place] first. Kathy Clark agrees to follow up with our clinic in 2 weeks.  At risk for osteopenia and osteoporosis Kathy Clark is at risk for osteopenia and osteoporosis due to her vitamin D deficiency. She was encouraged to take her vitamin D and follow her higher calcium diet and increase strengthening exercise to help strengthen her bones  and decrease her risk of osteopenia and osteoporosis.  Depression with Emotional Eating Behaviors We discussed behavior modification techniques today to help Kathy Clark deal with her emotional eating and depression. She has agreed to continue Wellbutrin SR 150 mg qd #30 with no refills and will  follow up as directed.  Obesity Kathy Clark is currently in the action stage of change. As such, her goal is to continue with weight loss efforts She has agreed to follow a lower carbohydrate, vegetable and lean protein rich diet plan Kathy Clark has been instructed to work up to a goal of 150 minutes of combined cardio and strengthening exercise per week for weight loss and overall health benefits. We discussed the following Behavioral Modification Strategies today: increasing lean protein intake and work on meal planning and easy cooking plans  Kathy Clark has agreed to follow up with our clinic in 2 weeks. She was informed of the importance of frequent follow up visits to maximize her success with intensive lifestyle modifications for her multiple health conditions.  I, Doreene Nest, am acting as transcriptionist for Lacy Duverney, PA-C  I have reviewed the above documentation for accuracy and completeness, and I agree with the above. -Lacy Duverney, PA-C  I have reviewed the above note and agree with the plan. -Dennard Nip, MD   OBESITY BEHAVIORAL INTERVENTION VISIT  Today's visit was # 21 out of 22.  Starting weight: 237 lbs Starting date: 08/03/16 Today's weight : 217 lbs Today's date: 06/15/2017 Total lbs lost to date: 20 (Patients must lose 7 lbs in the first 6 months to continue with counseling)   ASK: We discussed the diagnosis of obesity with Kathy Clark today and Kathy Clark agreed to give [place] permission to discuss obesity behavioral modification therapy today.  ASSESS: Kathy Clark has the diagnosis of obesity and her BMI today is 51 Kathy Clark is in the action stage of change   ADVISE: Kathy Clark was educated on the multiple health risks of obesity as well as the benefit of weight loss to improve her health. She was advised of the need for long term treatment and the importance of lifestyle modifications.  AGREE: Multiple dietary modification options and treatment options were discussed and  Kathy Clark  agreed to follow a lower carbohydrate, vegetable and lean protein rich diet plan We discussed the following Behavioral Modification Strategies today: increasing lean protein intake and work on meal planning and easy cooking plans

## 2017-06-25 MED FILL — VIT D2 1.25 MG (50,000 UNIT: 1.25 MG | 28 days supply | Qty: 4 | Fill #0

## 2017-06-30 ENCOUNTER — Ambulatory Visit (INDEPENDENT_AMBULATORY_CARE_PROVIDER_SITE_OTHER): Payer: 59 | Admitting: Physician Assistant

## 2017-06-30 VITALS — BP 136/79 | HR 68 | Temp 97.9°F | Ht 68.0 in | Wt 215.0 lb

## 2017-06-30 DIAGNOSIS — I10 Essential (primary) hypertension: Secondary | ICD-10-CM | POA: Diagnosis not present

## 2017-06-30 DIAGNOSIS — Z6832 Body mass index (BMI) 32.0-32.9, adult: Secondary | ICD-10-CM

## 2017-06-30 DIAGNOSIS — Z9189 Other specified personal risk factors, not elsewhere classified: Secondary | ICD-10-CM | POA: Diagnosis not present

## 2017-06-30 DIAGNOSIS — E669 Obesity, unspecified: Secondary | ICD-10-CM | POA: Diagnosis not present

## 2017-06-30 DIAGNOSIS — F3289 Other specified depressive episodes: Secondary | ICD-10-CM | POA: Diagnosis not present

## 2017-06-30 MED ORDER — BUPROPION HCL ER (SR) 150 MG PO TB12: 150.0000 mg | ORAL_TABLET | Freq: Every day | ORAL | 0 refills | Status: DC

## 2017-06-30 NOTE — Progress Notes (Signed)
Office: [phone]  /  Fax: [phone]   HPI:   Chief Complaint: OBESITY Kathy Clark is here to discuss her progress with her obesity treatment plan. She is on the follow a lower carbohydrate, vegetable and lean protein rich diet plan and is following her eating plan approximately 100 % of the time. She states she is walking for 45 minutes 4 times per week. Kathy Clark tolerated the low carbohydrates plan well. She would like more variety with her meals. Also, she would like holiday eating strategies. Her weight is 215 lb (97.5 kg) today and has had a weight loss of 2 pounds over a period of 2 weeks since her last visit. She has lost 22 lbs since starting treatment with [place].  Hypertension Kathy Clark is a 56 y.o. female with hypertension. Kathy Clark's blood pressure is stable and she denies chest pain or shortness of breath. She is working weight loss to help control her blood pressure with the goal of decreasing her risk of heart attack and stroke. Kathy Clark's blood pressure is currently controlled.  At risk for cardiovascular disease Kathy Clark is at a higher than average risk for cardiovascular disease due to obesity and hypertension. She currently denies any chest pain.  Depression with emotional eating behaviors Kathy Clark is struggling with emotional eating and using food for comfort to the extent that it is negatively impacting her health. She often snacks when she is not hungry. Kathy Clark sometimes feels she is out of control and then feels guilty that she made poor food choices. She has been working on behavior modification techniques to help reduce her emotional eating and has been somewhat successful. Her mood is stable and she shows no sign of suicidal or homicidal ideations.  Depression screen Kathy Clark 2/9 11/16/2016 08/03/2016 01/21/2016 08/26/2015 07/11/2015  Decreased Interest 2 3 0 1 1  Down, Depressed, Hopeless 2 3 0 1 2  PHQ - 2 Score 4 6 0 2 3  Altered sleeping 3 3 2 2 2   Tired, decreased energy 3 3 2 2  2   Change in appetite 1 3 1  0 2  Feeling bad or failure about yourself  1 3 0 0 1  Trouble concentrating 2 2 1  0 1  Moving slowly or fidgety/restless 1 0 0 0 0  Suicidal thoughts 0 0 0 0 0  PHQ-9 Score 15 20 6 6 11   Difficult doing work/chores - - Somewhat difficult Somewhat difficult Somewhat difficult   ALLERGIES: Allergies  Allergen Reactions  . Succinylcholine Anaphylaxis  . Dilaudid [Hydromorphone Hcl] Nausea And Vomiting    MEDICATIONS: Current Outpatient Medications on File Prior to Visit  Medication Sig Dispense Refill  . AMBULATORY NON FORMULARY MEDICATION Medication Name: Cpap set to 12 cm water pressure. Humidified, with mask tubing, etc. Dx: Moderate OSA with AHI = 18.4. Please fax to [place] Please send download on 03/22/2016 1 Units 0  . AMBULATORY NON FORMULARY MEDICATION Medication Name: Please decrease CPAP setting to 10 cm of water pressure and fax [place] a download after 10 days.  Is having a lot of difficulty with air leaks is working to try to reduce her pressure to see if she is still being adequately treated but with fewer leaks. 1 vial 0  . AMBULATORY NON FORMULARY MEDICATION CPAP download. Fax to [facility]. 1 each 0  . estradiol (VIVELLE-DOT) 0.075 MG/24HR   3  . gabapentin (NEURONTIN) 300 MG capsule Take 300 mg by mouth 2 (two) times daily.     . hydrochlorothiazide (  MICROZIDE) 12.5 MG capsule TAKE 1 CAPSULE (12.5 MG TOTAL) BY MOUTH DAILY. 90 capsule 1  . losartan (COZAAR) 25 MG tablet Take 1 tablet (25 mg total) by mouth daily. 90 tablet 3  . Melatonin 3 MG TABS Take by mouth as directed.    . Multiple Vitamins-Minerals (MULTIVITAMIN ADULT PO) Take by mouth daily.    . SUMAtriptan (IMITREX) 20 MG/ACT nasal spray PLACE 1 SPRAY INTO THE NOSE EVERY 2 HOURS AS NEEDED FOR. HEADACHE 6 Inhaler 2  . VIIBRYD 20 MG TABS TAKE 1 TABLET (20 MG TOTAL) BY MOUTH DAILY. 90 tablet 2  . Vitamin D, Ergocalciferol, (DRISDOL) 50000 units CAPS capsule Take 1 capsule (50,000 Units  total) by mouth every 7 (seven) days. 4 capsule 0  . [DISCONTINUED] rivaroxaban (XARELTO) 10 MG TABS tablet Take 1 tablet (10 mg total) by mouth daily with breakfast. 18 tablet 0   No current facility-administered medications on file prior to visit.     PAST MEDICAL HISTORY: Past Medical History:  Diagnosis Date  . Anxiety   . Back pain   . 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 Depression   . Essential hypertension, benign   . Gastritis   . GERD (gastroesophageal reflux disease)   . Hypertension   . Joint pain   . Lymphocytic colitis    microscopic  . Migraines   . Osteoarthritis   . Sciatica    right leg  . Swelling    feet, left foot    PAST SURGICAL HISTORY: Past Surgical History:  Procedure Laterality Date  . ABDOMINAL HYSTERECTOMY  07-2005  . [place] Bunionectomy Left 09/18/2016   Left Foot  . BACK SURGERY    . KNEE SURGERY  2006   left; multiple  . microdiscetomy  2005  . SHOULDER SURGERY  07-22-09   right  . SKIN CANCER EXCISION     eyelid  . TOTAL KNEE ARTHROPLASTY  07/27/2011   Procedure: TOTAL KNEE ARTHROPLASTY;  Surgeon: Kathy Clark;  Location: [facility];  Service: Orthopedics;  Laterality: Left;    SOCIAL HISTORY: Social History   Tobacco Use  . Smoking status: Never Smoker  . Smokeless tobacco: Never Used  Substance Use Topics  . Alcohol use: Yes    Comment: occasionally  . Drug use: No    FAMILY HISTORY: Family History  Problem Relation Age of Onset  . Breast cancer Unknown   . Tongue cancer Mother   . Breast cancer Mother   . Anxiety disorder Mother   . Depression Mother   . Heart disease Father   . Hypertension Father   . Hyperlipidemia Father   . Stroke Paternal Grandmother   . Colon cancer Neg Hx     ROS: Review of Systems  Constitutional: Positive for weight loss.  Respiratory: Negative for shortness of breath.   Cardiovascular: Negative for chest pain.  Psychiatric/Behavioral: Positive for  depression. Negative for suicidal ideas.    PHYSICAL EXAM: Blood pressure 136/79, pulse 68, temperature 97.9 F (36.6 C), temperature source Oral, height 5\' 8"  (1.727 m), weight 215 lb (97.5 kg), SpO2 97 %. Body mass index is 32.69 kg/m. Physical Exam  Constitutional: She is oriented to person, place, and time. She appears well-developed and well-nourished.  Cardiovascular: Normal rate.  Pulmonary/Chest: Effort normal.  Musculoskeletal: Normal range of motion.  Neurological: She is oriented to person, place, and time.  Skin: Skin is warm and dry.  Psychiatric: She has a normal mood and affect.  Vitals reviewed.   RECENT LABS AND TESTS: BMET    Component Value Date/Time   NA 142 03/10/2017 0828   K 4.5 03/10/2017 0828   CL 99 03/10/2017 0828   CO2 24 03/10/2017 0828   GLUCOSE 78 03/10/2017 0828   GLUCOSE 79 03/26/2016 1600   BUN 14 03/10/2017 0828   CREATININE 0.78 03/10/2017 0828   CREATININE 0.61 03/26/2016 1600   CALCIUM 9.7 03/10/2017 0828   GFRNONAA 85 03/10/2017 0828   GFRNONAA >89 03/26/2016 1600   GFRAA 98 03/10/2017 0828   GFRAA >89 03/26/2016 1600   Lab Results  Component Value Date   HGBA1C 5.1 12/01/2016   HGBA1C 4.9 08/03/2016   Lab Results  Component Value Date   INSULIN 5.4 12/01/2016   INSULIN 5.9 08/03/2016   CBC    Component Value Date/Time   WBC 6.4 08/03/2016 1458   WBC 6.7 03/26/2016 1600   RBC 4.56 08/03/2016 1458   RBC 4.68 03/26/2016 1600   HGB 13.6 08/03/2016 1458   HCT 41.3 08/03/2016 1458   PLT 287 03/26/2016 1600   MCV 91 08/03/2016 1458   MCH 29.8 08/03/2016 1458   MCH 30.6 03/26/2016 1600   MCHC 32.9 08/03/2016 1458   MCHC 33.6 03/26/2016 1600   RDW 12.9 08/03/2016 1458   LYMPHSABS 1.8 08/03/2016 1458   MONOABS 469 03/26/2016 1600   EOSABS 0.2 08/03/2016 1458   BASOSABS 0.0 08/03/2016 1458   Iron/TIBC/Ferritin/ %Sat    Component Value Date/Time   FERRITIN 142 12/25/2015 0912   Lipid Panel     Component Value  Date/Time   CHOL 190 03/10/2017 0828   TRIG 83 03/10/2017 0828   HDL 61 03/10/2017 0828   CHOLHDL 3.0 04/05/2015 0937   VLDL 21 04/05/2015 0937   LDLCALC 112 (H) 03/10/2017 0828   Hepatic Function Panel     Component Value Date/Time   PROT 6.6 03/10/2017 0828   ALBUMIN 4.2 03/10/2017 0828   AST 16 03/10/2017 0828   ALT 22 03/10/2017 0828   ALKPHOS 50 03/10/2017 0828   BILITOT 0.3 03/10/2017 0828   BILIDIR 0.1 12/25/2015 0912   IBILI 0.5 12/25/2015 0912      Component Value Date/Time   TSH 1.470 08/03/2016 1458   TSH 2.09 03/26/2016 1600   TSH 3.10 12/25/2015 0912    ASSESSMENT AND PLAN: Essential hypertension  Other depression - with emotional eating - Plan: buPROPion (WELLBUTRIN SR) 150 MG 12 hr tablet  At risk for heart disease  Class 1 obesity with serious comorbidity and body mass index (BMI) of 32.0 to 32.9 in adult, unspecified obesity type  PLAN:  Hypertension We discussed sodium restriction, working on healthy weight loss, and a regular exercise program as the means to achieve improved blood pressure control. Arabella agreed with this plan and agreed to follow up as directed. We will continue to monitor her blood pressure as well as her progress with the above lifestyle modifications. She will continue her medications as prescribed and will watch for signs of hypotension as she continues her lifestyle modifications. Kathy Clark agree to follow up with our clinic in 3 weeks.  Cardiovascular risk counselling Kathy Clark was given extended (15 minutes) coronary artery disease prevention counseling today. She is 56 y.o. female and has risk factors for heart disease including obesity and hypertension. We discussed intensive lifestyle modifications today with an emphasis on specific weight loss instructions and strategies. Pt was also informed of the importance of increasing exercise and decreasing saturated fats to  help prevent heart disease.  Depression with Emotional Eating  Behaviors We discussed behavior modification techniques today to help Kathy Clark deal with her emotional eating and depression. Kathy Clark agrees to continue taking Wellbutrin SR 150 mg qd #30 and we will refill for 1 month. Kathy Clark agrees to follow up with our clinic in 3 weeks.  Obesity Kathy Clark is currently in the action stage of change. As such, her goal is to continue with weight loss efforts She has agreed to change to keep a food journal with 1200 calories and 85 grams of protein daily Kathy Clark has been instructed to work up to a goal of 150 minutes of combined cardio and strengthening exercise per week for weight loss and overall health benefits. We discussed the following Behavioral Modification Strategies today: increasing lean protein intake and holiday eating strategies    Namita has agreed to follow up with our clinic in 3 weeks. She was informed of the importance of frequent follow up visits to maximize her success with intensive lifestyle modifications for her multiple health conditions.  I, Kathy Clark, am acting as transcriptionist for Kathy Duverney, PA-C  I have reviewed the above documentation for accuracy and completeness, and I agree with the above. -Kathy Duverney, PA-C  I have reviewed the above note and agree with the plan. -Kathy Nip, MD     Today's visit was # 22 out of 22.  Starting weight: 237 lbs Starting date: 08/03/16 Today's weight : 215 lbs  Today's date: 06/30/2017 Total lbs lost to date: 22 (Patients must lose 7 lbs in the first 6 months to continue with counseling)   ASK: We discussed the diagnosis of obesity with Kathy Clark today and Annabeth agreed to give [place] permission to discuss obesity behavioral modification therapy today.  ASSESS: Twila has the diagnosis of obesity and her BMI today is 32.7 Danielly is in the action stage of change   ADVISE: Tarica was educated on the multiple health risks of obesity as well as the benefit of weight loss to improve her  health. She was advised of the need for long term treatment and the importance of lifestyle modifications.  AGREE: Multiple dietary modification options and treatment options were discussed and  Jameah agreed to keep a food journal with 1200 calories and 85 grams of protein daily We discussed the following Behavioral Modification Strategies today: increasing lean protein intake and holiday eating strategies

## 2017-07-01 ENCOUNTER — Telehealth: Payer: 59 | Admitting: Physician Assistant

## 2017-07-01 DIAGNOSIS — B373 Candidiasis of vulva and vagina: Secondary | ICD-10-CM | POA: Diagnosis not present

## 2017-07-01 DIAGNOSIS — B3731 Acute candidiasis of vulva and vagina: Secondary | ICD-10-CM

## 2017-07-01 MED ORDER — FLUCONAZOLE 150 MG PO TABS: 150.0000 mg | ORAL_TABLET | Freq: Once | ORAL | 0 refills | Status: AC

## 2017-07-01 MED FILL — ESTRADIOL 0.075 MG PATCH: 0.075 | 84 days supply | Qty: 24 | Fill #0

## 2017-07-01 MED FILL — FLUCONAZOLE 150 MG TABLET: 150 | 1 days supply | Qty: 1 | Fill #0

## 2017-07-01 NOTE — Progress Notes (Signed)

## 2017-07-02 MED FILL — BUPROPION SR 150 MG TABLET: 150 | 30 days supply | Qty: 30 | Fill #0

## 2017-07-09 ENCOUNTER — Other Ambulatory Visit (INDEPENDENT_AMBULATORY_CARE_PROVIDER_SITE_OTHER): Payer: Self-pay | Admitting: Physician Assistant

## 2017-07-09 DIAGNOSIS — F3289 Other specified depressive episodes: Secondary | ICD-10-CM

## 2017-07-09 MED FILL — VIIBRYD 20 MG TABLET: 20 | 30 days supply | Qty: 30 | Fill #1

## 2017-07-21 ENCOUNTER — Ambulatory Visit (INDEPENDENT_AMBULATORY_CARE_PROVIDER_SITE_OTHER): Payer: 59 | Admitting: Physician Assistant

## 2017-07-21 VITALS — BP 110/75 | HR 74 | Temp 98.6°F | Ht 68.0 in | Wt 218.0 lb

## 2017-07-21 DIAGNOSIS — Z9189 Other specified personal risk factors, not elsewhere classified: Secondary | ICD-10-CM

## 2017-07-21 DIAGNOSIS — F3289 Other specified depressive episodes: Secondary | ICD-10-CM | POA: Diagnosis not present

## 2017-07-21 DIAGNOSIS — Z6833 Body mass index (BMI) 33.0-33.9, adult: Secondary | ICD-10-CM

## 2017-07-21 DIAGNOSIS — E669 Obesity, unspecified: Secondary | ICD-10-CM | POA: Diagnosis not present

## 2017-07-21 DIAGNOSIS — E559 Vitamin D deficiency, unspecified: Secondary | ICD-10-CM

## 2017-07-21 MED ORDER — BUPROPION HCL ER (SR) 150 MG PO TB12: 150.0000 mg | ORAL_TABLET | Freq: Every day | ORAL | 0 refills | Status: DC

## 2017-07-21 MED ORDER — VITAMIN D (ERGOCALCIFEROL) 1.25 MG (50000 UNIT) PO CAPS: 50000.0000 [IU] | ORAL_CAPSULE | ORAL | 0 refills | Status: DC

## 2017-07-21 MED FILL — VIT D2 1.25 MG (50,000 UNIT: 1.25 MG | 28 days supply | Qty: 4 | Fill #0

## 2017-07-21 NOTE — Progress Notes (Addendum)
Office: [phone]  /  Fax: [phone]   HPI:   Chief Complaint: OBESITY Kathy Clark is here to discuss her progress with her obesity treatment plan. She is on the keep a food journal with 1200 calories and 85 grams of protein daily and is following her eating plan approximately 80 % of the time. She states she is walking for 30 minutes 3 times per week. Caty has had guests over her house and although she is mindful of her eating, she has been deviating. Also, she is not keeping up with her water intake. Her weight is 218 lb (98.9 kg) today and has gained 3 pounds since her last visit. She has lost 19 lbs since starting treatment with [place].  Vitamin D deficiency Stewart has a diagnosis of vitamin D deficiency. She is currently taking prescription Vit D and denies nausea, vomiting or muscle weakness.  At risk for osteopenia and osteoporosis Kristen is at higher risk of osteopenia and osteoporosis due to vitamin D deficiency.   Depression with emotional eating behaviors Mitchell is struggling with emotional eating and using food for comfort to the extent that it is negatively impacting her health. She often snacks when she is not hungry. Darly sometimes feels she is out of control and then feels guilty that she made poor food choices. She has been working on behavior modification techniques to help reduce her emotional eating and has been somewhat successful. Her mood is stable and she shows no sign of suicidal or homicidal ideations.  Depression screen [facility] 2/9 11/16/2016 08/03/2016 01/21/2016 08/26/2015 07/11/2015  Decreased Interest 2 3 0 1 1  Down, Depressed, Hopeless 2 3 0 1 2  PHQ - 2 Score 4 6 0 2 3  Altered sleeping 3 3 2 2 2   Tired, decreased energy 3 3 2 2 2   Change in appetite 1 3 1  0 2  Feeling bad or failure about yourself  1 3 0 0 1  Trouble concentrating 2 2 1  0 1  Moving slowly or fidgety/restless 1 0 0 0 0  Suicidal thoughts 0 0 0 0 0  PHQ-9 Score 15 20 6 6 11   Difficult doing  work/chores - - Somewhat difficult Somewhat difficult Somewhat difficult   ALLERGIES: Allergies  Allergen Reactions  . Succinylcholine Anaphylaxis  . Dilaudid [Hydromorphone Hcl] Nausea And Vomiting    MEDICATIONS: Current Outpatient Medications on File Prior to Visit  Medication Sig Dispense Refill  . AMBULATORY NON FORMULARY MEDICATION Medication Name: Cpap set to 12 cm water pressure. Humidified, with mask tubing, etc. Dx: Moderate OSA with AHI = 18.4. Please fax to [place] Please send download on 03/22/2016 1 Units 0  . AMBULATORY NON FORMULARY MEDICATION Medication Name: Please decrease CPAP setting to 10 cm of water pressure and fax [place] a download after 10 days.  Is having a lot of difficulty with air leaks is working to try to reduce her pressure to see if she is still being adequately treated but with fewer leaks. 1 vial 0  . AMBULATORY NON FORMULARY MEDICATION CPAP download. Fax to [facility]. 1 each 0  . buPROPion (WELLBUTRIN SR) 150 MG 12 hr tablet Take 1 tablet (150 mg total) by mouth daily. 30 tablet 0  . estradiol (VIVELLE-DOT) 0.075 MG/24HR   3  . gabapentin (NEURONTIN) 300 MG capsule Take 300 mg by mouth 2 (two) times daily.     . hydrochlorothiazide (MICROZIDE) 12.5 MG capsule TAKE 1 CAPSULE (12.5 MG TOTAL) BY MOUTH DAILY. [place]  capsule 1  . losartan (COZAAR) 25 MG tablet Take 1 tablet (25 mg total) by mouth daily. 90 tablet 3  . Melatonin 3 MG TABS Take by mouth as directed.    . Multiple Vitamins-Minerals (MULTIVITAMIN ADULT PO) Take by mouth daily.    . SUMAtriptan (IMITREX) 20 MG/ACT nasal spray PLACE 1 SPRAY INTO THE NOSE EVERY 2 HOURS AS NEEDED FOR. HEADACHE 6 Inhaler 2  . VIIBRYD 20 MG TABS TAKE 1 TABLET (20 MG TOTAL) BY MOUTH DAILY. 90 tablet 2  . Vitamin D, Ergocalciferol, (DRISDOL) 50000 units CAPS capsule Take 1 capsule (50,000 Units total) by mouth every 7 (seven) days. 4 capsule 0  . [DISCONTINUED] rivaroxaban (XARELTO) 10 MG TABS tablet Take 1 tablet (10 mg  total) by mouth daily with breakfast. 18 tablet 0   No current facility-administered medications on file prior to visit.     PAST MEDICAL HISTORY: Past Medical History:  Diagnosis Date  . Anxiety   . Back pain   . 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 Depression   . Essential hypertension, benign   . Gastritis   . GERD (gastroesophageal reflux disease)   . Hypertension   . Joint pain   . Lymphocytic colitis    microscopic  . Migraines   . Osteoarthritis   . Sciatica    right leg  . Swelling    feet, left foot    PAST SURGICAL HISTORY: Past Surgical History:  Procedure Laterality Date  . ABDOMINAL HYSTERECTOMY  07-2005  . [place] Bunionectomy Left 09/18/2016   Left Foot  . BACK SURGERY    . KNEE SURGERY  2006   left; multiple  . microdiscetomy  2005  . SHOULDER SURGERY  07-22-09   right  . SKIN CANCER EXCISION     eyelid  . TOTAL KNEE ARTHROPLASTY  07/27/2011   Procedure: TOTAL KNEE ARTHROPLASTY;  Surgeon: Gearlean Alf;  Location: [facility];  Service: Orthopedics;  Laterality: Left;    SOCIAL HISTORY: Social History   Tobacco Use  . Smoking status: Never Smoker  . Smokeless tobacco: Never Used  Substance Use Topics  . Alcohol use: Yes    Comment: occasionally  . Drug use: No    FAMILY HISTORY: Family History  Problem Relation Age of Onset  . Breast cancer Unknown   . Tongue cancer Mother   . Breast cancer Mother   . Anxiety disorder Mother   . Depression Mother   . Heart disease Father   . Hypertension Father   . Hyperlipidemia Father   . Stroke Paternal Grandmother   . Colon cancer Neg Hx     ROS: Review of Systems  Constitutional: Negative for weight loss.  Gastrointestinal: Negative for nausea and vomiting.  Musculoskeletal:       Negative muscle weakness   Psychiatric/Behavioral: Positive for depression. Negative for suicidal ideas.    PHYSICAL EXAM: Blood pressure 110/75, pulse 74, temperature 98.6 F (37 C),  temperature source Oral, height 5\' 8"  (1.727 m), weight 218 lb (98.9 kg), SpO2 98 %. Body mass index is 33.15 kg/m. Physical Exam  Constitutional: She is oriented to person, place, and time. She appears well-developed and well-nourished.  Cardiovascular: Normal rate.  Pulmonary/Chest: Effort normal.  Musculoskeletal: Normal range of motion.  Neurological: She is oriented to person, place, and time.  Skin: Skin is warm and dry.  Psychiatric: She has a normal mood and affect.  Vitals reviewed.   RECENT LABS AND TESTS:  BMET    Component Value Date/Time   NA 142 03/10/2017 0828   K 4.5 03/10/2017 0828   CL 99 03/10/2017 0828   CO2 24 03/10/2017 0828   GLUCOSE 78 03/10/2017 0828   GLUCOSE 79 03/26/2016 1600   BUN 14 03/10/2017 0828   CREATININE 0.78 03/10/2017 0828   CREATININE 0.61 03/26/2016 1600   CALCIUM 9.7 03/10/2017 0828   GFRNONAA 85 03/10/2017 0828   GFRNONAA >89 03/26/2016 1600   GFRAA 98 03/10/2017 0828   GFRAA >89 03/26/2016 1600   Lab Results  Component Value Date   HGBA1C 5.1 12/01/2016   HGBA1C 4.9 08/03/2016   Lab Results  Component Value Date   INSULIN 5.4 12/01/2016   INSULIN 5.9 08/03/2016   CBC    Component Value Date/Time   WBC 6.4 08/03/2016 1458   WBC 6.7 03/26/2016 1600   RBC 4.56 08/03/2016 1458   RBC 4.68 03/26/2016 1600   HGB 13.6 08/03/2016 1458   HCT 41.3 08/03/2016 1458   PLT 287 03/26/2016 1600   MCV 91 08/03/2016 1458   MCH 29.8 08/03/2016 1458   MCH 30.6 03/26/2016 1600   MCHC 32.9 08/03/2016 1458   MCHC 33.6 03/26/2016 1600   RDW 12.9 08/03/2016 1458   LYMPHSABS 1.8 08/03/2016 1458   MONOABS 469 03/26/2016 1600   EOSABS 0.2 08/03/2016 1458   BASOSABS 0.0 08/03/2016 1458   Iron/TIBC/Ferritin/ %Sat    Component Value Date/Time   FERRITIN 142 12/25/2015 0912   Lipid Panel     Component Value Date/Time   CHOL 190 03/10/2017 0828   TRIG 83 03/10/2017 0828   HDL 61 03/10/2017 0828   CHOLHDL 3.0 04/05/2015 0937   VLDL  21 04/05/2015 0937   LDLCALC 112 (H) 03/10/2017 0828   Hepatic Function Panel     Component Value Date/Time   PROT 6.6 03/10/2017 0828   ALBUMIN 4.2 03/10/2017 0828   AST 16 03/10/2017 0828   ALT 22 03/10/2017 0828   ALKPHOS 50 03/10/2017 0828   BILITOT 0.3 03/10/2017 0828   BILIDIR 0.1 12/25/2015 0912   IBILI 0.5 12/25/2015 0912      Component Value Date/Time   TSH 1.470 08/03/2016 1458   TSH 2.09 03/26/2016 1600   TSH 3.10 12/25/2015 0912  Results for LANIQUE, GONZALO (MRN [id number]) as of 07/21/2017 13:29  Ref. Range 03/10/2017 08:28  Vitamin D, 25-Hydroxy Latest Ref Range: 30.0 - 100.0 ng/mL 43.0    ASSESSMENT AND PLAN: Vitamin D deficiency - Plan: Vitamin D, Ergocalciferol, (DRISDOL) 50000 units CAPS capsule  Other depression - with emotional eating - Plan: buPROPion (WELLBUTRIN SR) 150 MG 12 hr tablet  At risk for osteoporosis  Class 1 obesity with serious comorbidity and body mass index (BMI) of 33.0 to 33.9 in adult, unspecified obesity type  PLAN:  Vitamin D Deficiency Marvelyn was informed that low vitamin D levels contributes to fatigue and are associated with obesity, breast, and colon cancer. Sarafina agrees to continue taking prescription Vit D @50 ,000 IU every week #4 and we will refill for 1 month. She will follow up for routine testing of vitamin D, at least 2-3 times per year. She was informed of the risk of over-replacement of vitamin D and agrees to not increase her dose unless he discusses this with [place] first. Parlee agrees to follow up with our clinic in 2 weeks.  At risk for osteopenia and osteoporosis Iyanah is at risk for osteopenia and osteoporsis due to her vitamin D deficiency. She  was encouraged to take her vitamin D and follow her higher calcium diet and increase strengthening exercise to help strengthen her bones and decrease her risk of osteopenia and osteoporosis.  Depression with Emotional Eating Behaviors We discussed behavior modification  techniques today to help Renelle deal with her emotional eating and depression. Naria agrees to continue taking Wellbutrin SR 150 mg qd #30 and we will refill for 1 month. Maiana agrees to follow up with our clinic in 2 weeks.  Obesity Keziyah is currently in the action stage of change. As such, her goal is to continue with weight loss efforts She has agreed to keep a food journal with 1200 calories and 85 grams of protein daily Caretha has been instructed to work up to a goal of 150 minutes of combined cardio and strengthening exercise per week for weight loss and overall health benefits. We discussed the following Behavioral Modification Strategies today: increasing lean protein intake, increase H20 intake, and planning for success   Kenzlei has agreed to follow up with our clinic in 2 weeks. She was informed of the importance of frequent follow up visits to maximize her success with intensive lifestyle modifications for her multiple health conditions.  Wilhemena Durie, am acting as transcriptionist for Lacy Duverney, [facility] have reviewed this note and agree with its contents I have reviewed the above documentation for accuracy and completeness, and I agree with the above. -Dennard Nip, MD

## 2017-07-23 ENCOUNTER — Encounter: Payer: Self-pay | Admitting: Sports Medicine

## 2017-07-30 ENCOUNTER — Ambulatory Visit: Payer: 59 | Admitting: Sports Medicine

## 2017-07-30 ENCOUNTER — Encounter: Payer: Self-pay | Admitting: Sports Medicine

## 2017-07-30 DIAGNOSIS — G5603 Carpal tunnel syndrome, bilateral upper limbs: Secondary | ICD-10-CM

## 2017-07-30 NOTE — Progress Notes (Signed)
Subjective:    CC: Carpal tunnel syndrome  HPI: This is a pleasant 57 year old female, she is having numbness and tingling in both hands, radiating to the fingertips, worse at night.  I have done a median nerve hydrodissection approximately 4 months ago with good results.  She is now having recurrence of symptoms, moderate, persistent.  Desires repeat median nerve hydrodissection.  Symptoms have been present for the past several weeks  I reviewed the past medical history, family history, social history, surgical history, and allergies today and no changes were needed.  Please see the problem list section below in epic for further details.  Past Medical History: Past Medical History:  Diagnosis Date  . Anxiety   . Back pain   . 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 Depression   . Essential hypertension, benign   . Gastritis   . GERD (gastroesophageal reflux disease)   . Hypertension   . Joint pain   . Lymphocytic colitis    microscopic  . Migraines   . Osteoarthritis   . Sciatica    right leg  . Swelling    feet, left foot   Past Surgical History: Past Surgical History:  Procedure Laterality Date  . ABDOMINAL HYSTERECTOMY  07-2005  . [place] Bunionectomy Left 09/18/2016   Left Foot  . BACK SURGERY    . KNEE SURGERY  2006   left; multiple  . microdiscetomy  2005  . SHOULDER SURGERY  07-22-09   right  . SKIN CANCER EXCISION     eyelid  . TOTAL KNEE ARTHROPLASTY  07/27/2011   Procedure: TOTAL KNEE ARTHROPLASTY;  Surgeon: Gearlean Alf;  Location: [facility];  Service: Orthopedics;  Laterality: Left;   Social History: Social History   Socioeconomic History  . Marital status: Married    Spouse name: Jenny Reichmann  . Number of children: 2  . Years of education: None  . Highest education level: None  Social Needs  . Financial resource strain: None  . Food insecurity - worry: None  . Food insecurity - inability: None  . Transportation needs - medical:  None  . Transportation needs - non-medical: None  Occupational History  . Occupation: Programmer, multimedia:   Tobacco Use  . Smoking status: Never Smoker  . Smokeless tobacco: Never Used  Substance and Sexual Activity  . Alcohol use: Yes    Comment: occasionally  . Drug use: No  . 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  Other Topics Concern  . None  Social History Narrative  . None   Family History: Family History  Problem Relation Age of Onset  . Breast cancer Unknown   . Tongue cancer Mother   . Breast cancer Mother   . Anxiety disorder Mother   . Depression Mother   . Heart disease Father   . Hypertension Father   . Hyperlipidemia Father   . Stroke Paternal Grandmother   . Colon cancer Neg Hx    Allergies: Allergies  Allergen Reactions  . Succinylcholine Anaphylaxis  . Dilaudid [Hydromorphone Hcl] Nausea And Vomiting   Medications: See med rec.  Review of Systems: No fevers, chills, night sweats, weight loss, chest pain, or shortness of breath.   Objective:    General: Well Developed, well nourished, and in no acute distress.  Neuro: Alert and oriented x3, extra-ocular muscles intact, sensation grossly intact.  HEENT: Normocephalic, atraumatic, pupils equal round reactive  to light, neck supple, no masses, no lymphadenopathy, thyroid nonpalpable.  Skin: Warm and dry, no rashes. Cardiac: Regular rate and rhythm, no murmurs rubs or gallops, no lower extremity edema.  Respiratory: Clear to auscultation bilaterally. Not using accessory muscles, speaking in full sentences. Bilateral wrists: Inspection normal with no visible erythema or swelling. ROM smooth and normal with good flexion and extension and ulnar/radial deviation that is symmetrical with opposite wrist. Palpation is normal over metacarpals, navicular, lunate, and TFCC; tendons without tenderness/ swelling No snuffbox tenderness. No tenderness over  [place]. Strength 5/5 in all directions without pain. Positive Tinel's and phalens signs. Negative Finkelstein sign. Negative Watson's test.  Procedure: Real-time Ultrasound Guided Injection of left median nerve hydrodissection Device: GE Logiq E  Verbal informed consent obtained.  Time-out conducted.  Noted no overlying erythema, induration, or other signs of local infection.  Skin prepped in a sterile fashion.  Local anesthesia: Topical Ethyl chloride.  With sterile technique and under real time ultrasound guidance: Using 1 cc kenalog 40, 5 cc lidocaine I injected medication both superficial to and deep to the median nerve surrounding it with medication, further medication was injected deep into the carpal tunnel around the flexor tendons.  I took great care to avoid intraneural injection. Completed without difficulty  Pain immediately resolved suggesting accurate placement of the medication.  Advised to call if fevers/chills, erythema, induration, drainage, or persistent bleeding.  Images permanently stored and available for review in the ultrasound unit.  Impression: Technically successful ultrasound guided injection.  Procedure: Real-time Ultrasound Guided Injection of right median nerve hydrodissection Device: GE Logiq E  Verbal informed consent obtained.  Time-out conducted.  Noted no overlying erythema, induration, or other signs of local infection.  Skin prepped in a sterile fashion.  Local anesthesia: Topical Ethyl chloride.  With sterile technique and under real time ultrasound guidance: Using 1 cc kenalog 40, 5 cc lidocaine I injected medication both superficial to and deep to the median nerve surrounding it with medication, further medication was injected deep into the carpal tunnel around the flexor tendons.  I took great care to avoid intraneural injection. Completed without difficulty  Pain immediately resolved suggesting accurate placement of the medication.    Advised to call if fevers/chills, erythema, induration, drainage, or persistent bleeding.  Images permanently stored and available for review in the ultrasound unit.  Impression: Technically successful ultrasound guided injection.  Impression and Recommendations:    Bilateral carpal tunnel syndrome Repeat bilateral median nerve hydrodissection, the most recent procedure was approximately 4 months ago. Return as needed. ___________________________________________ Gwen Her. Dianah Field, M.D., ABFM., CAQSM. Primary Care and [place] Instructor of [place] [employer] [facility] [employer] [facility]

## 2017-07-30 NOTE — Assessment & Plan Note (Signed)
Repeat bilateral median nerve hydrodissection, the most recent procedure was approximately 4 months ago. Return as needed.

## 2017-08-04 ENCOUNTER — Ambulatory Visit (INDEPENDENT_AMBULATORY_CARE_PROVIDER_SITE_OTHER): Payer: 59 | Admitting: Physician Assistant

## 2017-08-04 VITALS — BP 126/80 | HR 67 | Temp 98.1°F | Ht 68.0 in | Wt 213.0 lb

## 2017-08-04 DIAGNOSIS — I10 Essential (primary) hypertension: Secondary | ICD-10-CM

## 2017-08-04 DIAGNOSIS — Z9189 Other specified personal risk factors, not elsewhere classified: Secondary | ICD-10-CM

## 2017-08-04 DIAGNOSIS — E669 Obesity, unspecified: Secondary | ICD-10-CM

## 2017-08-04 DIAGNOSIS — Z6832 Body mass index (BMI) 32.0-32.9, adult: Secondary | ICD-10-CM

## 2017-08-04 DIAGNOSIS — F3289 Other specified depressive episodes: Secondary | ICD-10-CM

## 2017-08-04 MED ORDER — BUPROPION HCL ER (SR) 150 MG PO TB12: 150.0000 mg | ORAL_TABLET | Freq: Every day | ORAL | 0 refills | Status: DC

## 2017-08-04 NOTE — Progress Notes (Signed)
Office: [phone]  /  Fax: [phone]   HPI:   Chief Complaint: OBESITY Kathy Clark is here to discuss her progress with her obesity treatment plan. She is on the keep a food journal with 1200 calories and 85 grams of protein daily and is following her eating plan approximately 80 % of the time. She states she is walking for 30 minutes 4-6 times per week. Kathy Clark continues to do well with weight loss. She has been very mindful of her eating and incorporates variety with her meals.  Her weight is 213 lb (96.6 kg) today and has had a weight loss of 5 pounds over a period of 2 weeks since her last visit. She has lost 24 lbs since starting treatment with [place].  Hypertension Kathy Clark is a 57 y.o. female with hypertension. Kathy Clark's blood pressure is stable and she denies chest pain or shortness of breath. She is working weight loss to help control her blood pressure with the goal of decreasing her risk of heart attack and stroke. Kathy Clark blood pressure is currently controlled.  At risk for cardiovascular disease Kathy Clark is at a higher than average risk for cardiovascular disease due to obesity and hypertension. She currently denies any chest pain.  Depression with emotional eating behaviors Kathy Clark is struggling with emotional eating and using food for comfort to the extent that it is negatively impacting her health. She often snacks when she is not hungry. Kathy Clark sometimes feels she is out of control and then feels guilty that she made poor food choices. She has been working on behavior modification techniques to help reduce her emotional eating and has been somewhat successful. Her mood is stable and she shows no sign of suicidal or homicidal ideations.  Depression screen Kathy Clark 2/9 11/16/2016 08/03/2016 01/21/2016 08/26/2015 07/11/2015  Decreased Interest 2 3 0 1 1  Down, Depressed, Hopeless 2 3 0 1 2  PHQ - 2 Score 4 6 0 2 3  Altered sleeping 3 3 2 2 2   Tired, decreased energy 3 3 2 2 2   Change in  appetite 1 3 1  0 2  Feeling bad or failure about yourself  1 3 0 0 1  Trouble concentrating 2 2 1  0 1  Moving slowly or fidgety/restless 1 0 0 0 0  Suicidal thoughts 0 0 0 0 0  PHQ-9 Score 15 20 6 6 11   Difficult doing work/chores - - Somewhat difficult Somewhat difficult Somewhat difficult   ALLERGIES: Allergies  Allergen Reactions  . Succinylcholine Anaphylaxis  . Dilaudid [Hydromorphone Hcl] Nausea And Vomiting    MEDICATIONS: Current Outpatient Medications on File Prior to Visit  Medication Sig Dispense Refill  . AMBULATORY NON FORMULARY MEDICATION Medication Name: Please decrease CPAP setting to 10 cm of water pressure and fax [place] a download after 10 days.  Is having a lot of difficulty with air leaks is working to try to reduce her pressure to see if she is still being adequately treated but with fewer leaks. 1 vial 0  . buPROPion (WELLBUTRIN SR) 150 MG 12 hr tablet Take 1 tablet (150 mg total) by mouth daily. 30 tablet 0  . estradiol (VIVELLE-DOT) 0.075 MG/24HR   3  . gabapentin (NEURONTIN) 300 MG capsule Take 300 mg by mouth 2 (two) times daily.     . hydrochlorothiazide (MICROZIDE) 12.5 MG capsule TAKE 1 CAPSULE (12.5 MG TOTAL) BY MOUTH DAILY. 90 capsule 1  . losartan (COZAAR) 25 MG tablet Take 1 tablet (25 mg total) by  mouth daily. 90 tablet 3  . Melatonin 3 MG TABS Take by mouth as directed.    . Multiple Vitamins-Minerals (MULTIVITAMIN ADULT PO) Take by mouth daily.    . SUMAtriptan (IMITREX) 20 MG/ACT nasal spray PLACE 1 SPRAY INTO THE NOSE EVERY 2 HOURS AS NEEDED FOR. HEADACHE 6 Inhaler 2  . VIIBRYD 20 MG TABS TAKE 1 TABLET (20 MG TOTAL) BY MOUTH DAILY. 90 tablet 2  . Vitamin D, Ergocalciferol, (DRISDOL) 50000 units CAPS capsule Take 1 capsule (50,000 Units total) by mouth every 7 (seven) days. 4 capsule 0  . [DISCONTINUED] rivaroxaban (XARELTO) 10 MG TABS tablet Take 1 tablet (10 mg total) by mouth daily with breakfast. 18 tablet 0   No current facility-administered  medications on file prior to visit.     PAST MEDICAL HISTORY: Past Medical History:  Diagnosis Date  . Anxiety   . Back pain   . 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 Depression   . Essential hypertension, benign   . Gastritis   . GERD (gastroesophageal reflux disease)   . Hypertension   . Joint pain   . Lymphocytic colitis    microscopic  . Migraines   . Osteoarthritis   . Sciatica    right leg  . Swelling    feet, left foot    PAST SURGICAL HISTORY: Past Surgical History:  Procedure Laterality Date  . ABDOMINAL HYSTERECTOMY  07-2005  . [place] Bunionectomy Left 09/18/2016   Left Foot  . BACK SURGERY    . KNEE SURGERY  2006   left; multiple  . microdiscetomy  2005  . SHOULDER SURGERY  07-22-09   right  . SKIN CANCER EXCISION     eyelid  . TOTAL KNEE ARTHROPLASTY  07/27/2011   Procedure: TOTAL KNEE ARTHROPLASTY;  Surgeon: Gearlean Alf;  Location: [facility];  Service: Orthopedics;  Laterality: Left;    SOCIAL HISTORY: Social History   Tobacco Use  . Smoking status: Never Smoker  . Smokeless tobacco: Never Used  Substance Use Topics  . Alcohol use: Yes    Comment: occasionally  . Drug use: No    FAMILY HISTORY: Family History  Problem Relation Age of Onset  . Breast cancer Unknown   . Tongue cancer Mother   . Breast cancer Mother   . Anxiety disorder Mother   . Depression Mother   . Heart disease Father   . Hypertension Father   . Hyperlipidemia Father   . Stroke Paternal Grandmother   . Colon cancer Neg Hx     ROS: Review of Systems  Constitutional: Positive for weight loss.  Respiratory: Negative for shortness of breath.   Cardiovascular: Negative for chest pain.  Psychiatric/Behavioral: Positive for depression. Negative for suicidal ideas.    PHYSICAL EXAM: Blood pressure 126/80, pulse 67, temperature 98.1 F (36.7 C), temperature source Oral, height 5\' 8"  (1.727 m), weight 213 lb (96.6 kg), SpO2 97 %. Body mass  index is 32.39 kg/m. Physical Exam  Constitutional: She is oriented to person, place, and time. She appears well-developed and well-nourished.  Cardiovascular: Normal rate.  Pulmonary/Chest: Effort normal.  Musculoskeletal: Normal range of motion.  Neurological: She is oriented to person, place, and time.  Skin: Skin is warm and dry.  Psychiatric: She has a normal mood and affect. Her behavior is normal.  Vitals reviewed.   RECENT LABS AND TESTS: BMET    Component Value Date/Time   NA 142 03/10/2017 0828   K 4.5  03/10/2017 0828   CL 99 03/10/2017 0828   CO2 24 03/10/2017 0828   GLUCOSE 78 03/10/2017 0828   GLUCOSE 79 03/26/2016 1600   BUN 14 03/10/2017 0828   CREATININE 0.78 03/10/2017 0828   CREATININE 0.61 03/26/2016 1600   CALCIUM 9.7 03/10/2017 0828   GFRNONAA 85 03/10/2017 0828   GFRNONAA >89 03/26/2016 1600   GFRAA 98 03/10/2017 0828   GFRAA >89 03/26/2016 1600   Lab Results  Component Value Date   HGBA1C 5.1 12/01/2016   HGBA1C 4.9 08/03/2016   Lab Results  Component Value Date   INSULIN 5.4 12/01/2016   INSULIN 5.9 08/03/2016   CBC    Component Value Date/Time   WBC 6.4 08/03/2016 1458   WBC 6.7 03/26/2016 1600   RBC 4.56 08/03/2016 1458   RBC 4.68 03/26/2016 1600   HGB 13.6 08/03/2016 1458   HCT 41.3 08/03/2016 1458   PLT 287 03/26/2016 1600   MCV 91 08/03/2016 1458   MCH 29.8 08/03/2016 1458   MCH 30.6 03/26/2016 1600   MCHC 32.9 08/03/2016 1458   MCHC 33.6 03/26/2016 1600   RDW 12.9 08/03/2016 1458   LYMPHSABS 1.8 08/03/2016 1458   MONOABS 469 03/26/2016 1600   EOSABS 0.2 08/03/2016 1458   BASOSABS 0.0 08/03/2016 1458   Iron/TIBC/Ferritin/ %Sat    Component Value Date/Time   FERRITIN 142 12/25/2015 0912   Lipid Panel     Component Value Date/Time   CHOL 190 03/10/2017 0828   TRIG 83 03/10/2017 0828   HDL 61 03/10/2017 0828   CHOLHDL 3.0 04/05/2015 0937   VLDL 21 04/05/2015 0937   LDLCALC 112 (H) 03/10/2017 0828   Hepatic  Function Panel     Component Value Date/Time   PROT 6.6 03/10/2017 0828   ALBUMIN 4.2 03/10/2017 0828   AST 16 03/10/2017 0828   ALT 22 03/10/2017 0828   ALKPHOS 50 03/10/2017 0828   BILITOT 0.3 03/10/2017 0828   BILIDIR 0.1 12/25/2015 0912   IBILI 0.5 12/25/2015 0912      Component Value Date/Time   TSH 1.470 08/03/2016 1458   TSH 2.09 03/26/2016 1600   TSH 3.10 12/25/2015 0912    ASSESSMENT AND PLAN: Essential hypertension  Other depression - with emotional eating - Plan: buPROPion (WELLBUTRIN SR) 150 MG 12 hr tablet  Class 1 obesity with serious comorbidity and body mass index (BMI) of 32.0 to 32.9 in adult, unspecified obesity type  PLAN:  Hypertension We discussed sodium restriction, working on healthy weight loss, and a regular exercise program as the means to achieve improved blood pressure control. Kathy Clark agreed with this plan and agreed to follow up as directed. We will continue to monitor her blood pressure as well as her progress with the above lifestyle modifications. She will continue her medications as prescribed and will watch for signs of hypotension as she continues her lifestyle modifications. Kathy Clark agrees to follow up with our clinic in 2 weeks.  Cardiovascular risk counselling Kathy Clark was given extended (15 minutes) coronary artery disease prevention counseling today. She is 57 y.o. female and has risk factors for heart disease including obesity and hypertension. We discussed intensive lifestyle modifications today with an emphasis on specific weight loss instructions and strategies. Pt was also informed of the importance of increasing exercise and decreasing saturated fats to help prevent heart disease.  Depression with Emotional Eating Behaviors We discussed behavior modification techniques today to help Kathy Clark deal with her emotional eating and depression. Kathy Clark agrees to continue taking  Wellbutrin SR 150 mg qd #30 and we will refill for 1 month. Kathy Clark agrees to  folloe up with our clinic in 2 weeks.  Obesity Kathy Clark is currently in the action stage of change. As such, her goal is to continue with weight loss efforts She has agreed to keep a food journal with 1200 calories and 85 grams of protein daily Kathy Clark has been instructed to work up to a goal of 150 minutes of combined cardio and strengthening exercise per week for weight loss and overall health benefits. We discussed the following Behavioral Modification Strategies today: increasing lean protein intake and work on meal planning and easy cooking plans We discussed various medica  Kathy Clark has agreed to follow up with our clinic in 2 weeks. She was informed of the importance of frequent follow up visits to maximize her success with intensive lifestyle modifications for her multiple health conditions.   Wilhemena Durie, am acting as transcriptionist for Lacy Duverney, PA-C I, Lacy Duverney [facility], have reviewed this note and agree with its contents.

## 2017-08-09 ENCOUNTER — Encounter (INDEPENDENT_AMBULATORY_CARE_PROVIDER_SITE_OTHER): Payer: Self-pay | Admitting: Physician Assistant

## 2017-08-09 MED FILL — VIIBRYD 20 MG TABLET: 20 | 30 days supply | Qty: 30 | Fill #2

## 2017-08-09 MED FILL — HYDROCHLOROTHIAZIDE 12.5 MG: 12.5 | 90 days supply | Qty: 90 | Fill #1

## 2017-08-09 MED FILL — LOSARTAN POTASSIUM 25 MG TA: 25 | 90 days supply | Qty: 90 | Fill #3

## 2017-08-09 MED FILL — GABAPENTIN 300 MG CAPSULE: 300 | 90 days supply | Qty: 180 | Fill #3

## 2017-08-10 ENCOUNTER — Telehealth: Payer: 59 | Admitting: Family

## 2017-08-10 DIAGNOSIS — B9789 Other viral agents as the cause of diseases classified elsewhere: Secondary | ICD-10-CM

## 2017-08-10 DIAGNOSIS — J019 Acute sinusitis, unspecified: Secondary | ICD-10-CM

## 2017-08-10 MED ORDER — PREDNISONE 10 MG (21) PO TBPK: ORAL_TABLET | ORAL | 0 refills | Status: DC

## 2017-08-10 MED FILL — BUPROPION SR 150 MG TABLET: 150 | 30 days supply | Qty: 30 | Fill #0

## 2017-08-10 MED FILL — predniSONE 10 MG TABS: 10 | 6 days supply | Qty: 21 | Fill #0

## 2017-08-10 NOTE — Progress Notes (Signed)
We are sorry that you are not feeling well.  Here is how we plan to help!  Based on what you have shared with me it looks like you have sinusitis.  Sinusitis is inflammation and infection in the sinus cavities of the head.  Based on your presentation I believe you most likely have Acute Viral Sinusitis.This is an infection most likely caused by a virus. There is not specific treatment for viral sinusitis other than to help you with the symptoms until the infection runs its course.  You may use an oral decongestant such as Mucinex D or if you have glaucoma or high blood pressure use plain Mucinex. Saline nasal spray help and can safely be used as often as needed for congestion, I have prescribed: Sterapred dosepack and sent it to the pharmacy.  Some authorities believe that zinc sprays or the use of Echinacea may shorten the course of your symptoms.  Sinus infections are not as easily transmitted as other respiratory infection, however we still recommend that you avoid close contact with loved ones, especially the very young and elderly.  Remember to wash your hands thoroughly throughout the day as this is the number one way to prevent the spread of infection!  Home Care:  Only take medications as instructed by your medical team.  Complete the entire course of an antibiotic.  Do not take these medications with alcohol.  A steam or ultrasonic humidifier can help congestion.  You can place a towel over your head and breathe in the steam from hot water coming from a faucet.  Avoid close contacts especially the very young and the elderly.  Cover your mouth when you cough or sneeze.  Always remember to wash your hands.  Get Help Right Away If:  You develop worsening fever or sinus pain.  You develop a severe head ache or visual changes.  Your symptoms persist after you have completed your treatment plan.  Make sure you  Understand these instructions.  Will watch your condition.  Will  get help right away if you are not doing well or get worse.  Your e-visit answers were reviewed by a board certified advanced clinical practitioner to complete your personal care plan.  Depending on the condition, your plan could have included both over the counter or prescription medications.  If there is a problem please reply  once you have received a response from your provider.  Your safety is important to [place].  If you have drug allergies check your prescription carefully.    You can use MyChart to ask questions about today's visit, request a non-urgent call back, or ask for a work or school excuse for 24 hours related to this e-Visit. If it has been greater than 24 hours you will need to follow up with your provider, or enter a new e-Visit to address those concerns.  You will get an e-mail in the next two days asking about your experience.  I hope that your e-visit has been valuable and will speed your recovery. Thank you for using e-visits.

## 2017-08-17 ENCOUNTER — Encounter: Payer: Self-pay | Admitting: Family Medicine

## 2017-08-18 ENCOUNTER — Ambulatory Visit (INDEPENDENT_AMBULATORY_CARE_PROVIDER_SITE_OTHER): Payer: 59 | Admitting: Physician Assistant

## 2017-08-18 VITALS — BP 139/84 | HR 74 | Temp 97.8°F | Ht 68.0 in | Wt 209.0 lb

## 2017-08-18 DIAGNOSIS — E559 Vitamin D deficiency, unspecified: Secondary | ICD-10-CM | POA: Diagnosis not present

## 2017-08-18 DIAGNOSIS — Z9189 Other specified personal risk factors, not elsewhere classified: Secondary | ICD-10-CM

## 2017-08-18 DIAGNOSIS — I10 Essential (primary) hypertension: Secondary | ICD-10-CM | POA: Diagnosis not present

## 2017-08-18 DIAGNOSIS — E7849 Other hyperlipidemia: Secondary | ICD-10-CM

## 2017-08-18 DIAGNOSIS — Z6831 Body mass index (BMI) 31.0-31.9, adult: Secondary | ICD-10-CM | POA: Diagnosis not present

## 2017-08-18 DIAGNOSIS — E669 Obesity, unspecified: Secondary | ICD-10-CM | POA: Diagnosis not present

## 2017-08-18 MED ORDER — CLONAZEPAM 0.25 MG PO TBDP: 0.2500 mg | ORAL_TABLET | Freq: Every day | ORAL | 0 refills | Status: DC | PRN

## 2017-08-18 MED ORDER — VITAMIN D (ERGOCALCIFEROL) 1.25 MG (50000 UNIT) PO CAPS: 50000.0000 [IU] | ORAL_CAPSULE | ORAL | 0 refills | Status: DC

## 2017-08-18 MED FILL — VIT D2 1.25 MG (50,000 UNIT: 1.25 MG | 28 days supply | Qty: 4 | Fill #0

## 2017-08-18 MED FILL — clonazePAM 0.25 MG TBDP: 0.25 | 10 days supply | Qty: 20 | Fill #0

## 2017-08-18 NOTE — Progress Notes (Signed)
Office: [phone]  /  Fax: [phone]   HPI:   Chief Complaint: OBESITY Kathy Clark is here to discuss her progress with her obesity treatment plan. She is on the keep a food journal with 1200 calories and 85 grams of protein daily and is following her eating plan approximately 90 % of the time. She states she is walking for 30 minutes 5 times per week. Atlas continues to do well with weight loss. She states she is undergoing stress and does not think she meets her calorie and protein target.  Her weight is 209 lb (94.8 kg) today and has had a weight loss of 4 pounds over a period of 2 weeks since her last visit. She has lost 28 lbs since starting treatment with [place].  Hypertension Kathy Clark is a 57 y.o. female with hypertension. [name]'s blood pressure is stable and she denies chest pain or shortness of breath. She is working weight loss to help control her blood pressure with the goal of decreasing her risk of heart attack and stroke. [name]'s blood pressure is currently controlled.  Hyperlipidemia [name] has hyperlipidemia and has been trying to improve her cholesterol levels with intensive lifestyle modification including a low saturated fat diet, exercise and weight loss. She is not on statin, declines today and she denies any chest pain, claudication or myalgias.  At risk for cardiovascular disease [name] is at a higher than average risk for cardiovascular disease due to obesity, hypertension, and hyperlipidemia. She currently denies any chest pain.  Vitamin D deficiency [name] has a diagnosis of vitamin D deficiency. She is currently taking prescription Vit D and she denies nausea, vomiting or muscle weakness.  ALLERGIES: Allergies  Allergen Reactions  . Succinylcholine Anaphylaxis  . Dilaudid [Hydromorphone Hcl] Nausea And Vomiting    MEDICATIONS: Current Outpatient Medications on File Prior to Visit  Medication Sig Dispense Refill  . AMBULATORY NON FORMULARY MEDICATION  Medication Name: Please decrease CPAP setting to 10 cm of water pressure and fax [place] a download after 10 days.  Is having a lot of difficulty with air leaks is working to try to reduce her pressure to see if she is still being adequately treated but with fewer leaks. 1 vial 0  . buPROPion (WELLBUTRIN SR) 150 MG 12 hr tablet Take 1 tablet (150 mg total) by mouth daily. 30 tablet 0  . estradiol (VIVELLE-DOT) 0.075 MG/24HR   3  . gabapentin (NEURONTIN) 300 MG capsule Take 300 mg by mouth 2 (two) times daily.     . hydrochlorothiazide (MICROZIDE) 12.5 MG capsule TAKE 1 CAPSULE (12.5 MG TOTAL) BY MOUTH DAILY. 90 capsule 1  . losartan (COZAAR) 25 MG tablet Take 1 tablet (25 mg total) by mouth daily. 90 tablet 3  . Melatonin 3 MG TABS Take by mouth as directed.    . Multiple Vitamins-Minerals (MULTIVITAMIN ADULT PO) Take by mouth daily.    . predniSONE (STERAPRED UNI-PAK 21 TAB) 10 MG (21) TBPK tablet As directed 21 tablet 0  . SUMAtriptan (IMITREX) 20 MG/ACT nasal spray PLACE 1 SPRAY INTO THE NOSE EVERY 2 HOURS AS NEEDED FOR. HEADACHE 6 Inhaler 2  . VIIBRYD 20 MG TABS TAKE 1 TABLET (20 MG TOTAL) BY MOUTH DAILY. 90 tablet 2  . Vitamin D, Ergocalciferol, (DRISDOL) 50000 units CAPS capsule Take 1 capsule (50,000 Units total) by mouth every 7 (seven) days. 4 capsule 0  . [DISCONTINUED] rivaroxaban (XARELTO) 10 MG TABS tablet Take 1 tablet (10 mg total) by mouth daily with  breakfast. 18 tablet 0   No current facility-administered medications on file prior to visit.     PAST MEDICAL HISTORY: Past Medical History:  Diagnosis Date  . Anxiety   . Back pain   . 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 Depression   . Essential hypertension, benign   . Gastritis   . GERD (gastroesophageal reflux disease)   . Hypertension   . Joint pain   . Lymphocytic colitis    microscopic  . Migraines   . Osteoarthritis   . Sciatica    right leg  . Swelling    feet, left foot    PAST  SURGICAL HISTORY: Past Surgical History:  Procedure Laterality Date  . ABDOMINAL HYSTERECTOMY  07-2005  . [place] Bunionectomy Left 09/18/2016   Left Foot  . BACK SURGERY    . KNEE SURGERY  2006   left; multiple  . microdiscetomy  2005  . SHOULDER SURGERY  07-22-09   right  . SKIN CANCER EXCISION     eyelid  . TOTAL KNEE ARTHROPLASTY  07/27/2011   Procedure: TOTAL KNEE ARTHROPLASTY;  Surgeon: Gearlean Alf;  Location: [facility];  Service: Orthopedics;  Laterality: Left;    SOCIAL HISTORY: Social History   Tobacco Use  . Smoking status: Never Smoker  . Smokeless tobacco: Never Used  Substance Use Topics  . Alcohol use: Yes    Comment: occasionally  . Drug use: No    FAMILY HISTORY: Family History  Problem Relation Age of Onset  . Breast cancer Unknown   . Tongue cancer Mother   . Breast cancer Mother   . Anxiety disorder Mother   . Depression Mother   . Heart disease Father   . Hypertension Father   . Hyperlipidemia Father   . Stroke Paternal Grandmother   . Colon cancer Neg Hx     ROS: Review of Systems  Constitutional: Positive for weight loss.  Respiratory: Negative for shortness of breath.   Cardiovascular: Negative for chest pain and claudication.  Gastrointestinal: Negative for nausea and vomiting.  Musculoskeletal: Negative for myalgias.       Negative muscle weakness    PHYSICAL EXAM: Blood pressure 139/84, pulse 74, temperature 97.8 F (36.6 C), temperature source Oral, height 5\' 8"  (1.727 m), weight 209 lb (94.8 kg), SpO2 98 %. Body mass index is 31.78 kg/m. Physical Exam  Constitutional: She is oriented to person, place, and time. She appears well-developed and well-nourished.  Cardiovascular: Normal rate.  Pulmonary/Chest: Effort normal.  Musculoskeletal: Normal range of motion.  Neurological: She is oriented to person, place, and time.  Skin: Skin is warm and dry.  Psychiatric: She has a normal mood and affect. Her behavior is normal.    Vitals reviewed.   RECENT LABS AND TESTS: BMET    Component Value Date/Time   NA 142 03/10/2017 0828   K 4.5 03/10/2017 0828   CL 99 03/10/2017 0828   CO2 24 03/10/2017 0828   GLUCOSE 78 03/10/2017 0828   GLUCOSE 79 03/26/2016 1600   BUN 14 03/10/2017 0828   CREATININE 0.78 03/10/2017 0828   CREATININE 0.61 03/26/2016 1600   CALCIUM 9.7 03/10/2017 0828   GFRNONAA 85 03/10/2017 0828   GFRNONAA >89 03/26/2016 1600   GFRAA 98 03/10/2017 0828   GFRAA >89 03/26/2016 1600   Lab Results  Component Value Date   HGBA1C 5.1 12/01/2016   HGBA1C 4.9 08/03/2016   Lab Results  Component Value Date  INSULIN 5.4 12/01/2016   INSULIN 5.9 08/03/2016   CBC    Component Value Date/Time   WBC 6.4 08/03/2016 1458   WBC 6.7 03/26/2016 1600   RBC 4.56 08/03/2016 1458   RBC 4.68 03/26/2016 1600   HGB 13.6 08/03/2016 1458   HCT 41.3 08/03/2016 1458   PLT 287 03/26/2016 1600   MCV 91 08/03/2016 1458   MCH 29.8 08/03/2016 1458   MCH 30.6 03/26/2016 1600   MCHC 32.9 08/03/2016 1458   MCHC 33.6 03/26/2016 1600   RDW 12.9 08/03/2016 1458   LYMPHSABS 1.8 08/03/2016 1458   MONOABS 469 03/26/2016 1600   EOSABS 0.2 08/03/2016 1458   BASOSABS 0.0 08/03/2016 1458   Iron/TIBC/Ferritin/ %Sat    Component Value Date/Time   FERRITIN 142 12/25/2015 0912   Lipid Panel     Component Value Date/Time   CHOL 190 03/10/2017 0828   TRIG 83 03/10/2017 0828   HDL 61 03/10/2017 0828   CHOLHDL 3.0 04/05/2015 0937   VLDL 21 04/05/2015 0937   LDLCALC 112 (H) 03/10/2017 0828   Hepatic Function Panel     Component Value Date/Time   PROT 6.6 03/10/2017 0828   ALBUMIN 4.2 03/10/2017 0828   AST 16 03/10/2017 0828   ALT 22 03/10/2017 0828   ALKPHOS 50 03/10/2017 0828   BILITOT 0.3 03/10/2017 0828   BILIDIR 0.1 12/25/2015 0912   IBILI 0.5 12/25/2015 0912      Component Value Date/Time   TSH 1.470 08/03/2016 1458   TSH 2.09 03/26/2016 1600   TSH 3.10 12/25/2015 0912  Results for SEMYA, KLINKE (MRN [id number]) as of 08/18/2017 09:56  Ref. Range 03/10/2017 08:28  Vitamin D, 25-Hydroxy Latest Ref Range: 30.0 - 100.0 ng/mL 43.0    ASSESSMENT AND PLAN: Essential hypertension - Plan: Comprehensive metabolic panel  Other hyperlipidemia - Plan: Lipid Panel With LDL/HDL Ratio  Vitamin D deficiency - Plan: VITAMIN D 25 Hydroxy (Vit-D Deficiency, Fractures), Vitamin D, Ergocalciferol, (DRISDOL) 50000 units CAPS capsule  At risk for heart disease  Class 1 obesity with serious comorbidity and body mass index (BMI) of 31.0 to 31.9 in adult, unspecified obesity type  PLAN:  Hypertension We discussed sodium restriction, working on healthy weight loss, and a regular exercise program as the means to achieve improved blood pressure control. Catheline agreed with this plan and agreed to follow up as directed. We will continue to monitor her blood pressure as well as her progress with the above lifestyle modifications. She will continue her medications as prescribed and will watch for signs of hypotension as she continues her lifestyle modifications. We will check labs and Annamay agrees to follow up with our clinic in 2 weeks.  Hyperlipidemia Kristiana was informed of the American Heart Association Guidelines emphasizing intensive lifestyle modifications as the first line treatment for hyperlipidemia. We discussed many lifestyle modifications today in depth, and Nadie will continue to work on decreasing saturated fats such as fatty red meat, butter and many fried foods. She will also increase vegetables and lean protein in her diet and continue to work on diet, exercise, and weight loss efforts. We will check labs and Peytan agrees to follow up with our clinic in 2 weeks.  Cardiovascular risk counselling Solae was given extended (15 minutes) coronary artery disease prevention counseling today. She is 57 y.o. female and has risk factors for heart disease including obesity, hypertension, and hyperlipidemia.  We discussed intensive lifestyle modifications today with an emphasis on specific weight loss instructions and  strategies. Pt was also informed of the importance of increasing exercise and decreasing saturated fats to help prevent heart disease.  Vitamin D Deficiency [place] was informed that low vitamin D levels contributes to fatigue and are associated with obesity, breast, and colon cancer. Jayleene agrees to continue taking prescription Vit D @50 ,000 IU every week #4 and we will refill for 1 month. She will follow up for routine testing of vitamin D, at least 2-3 times per year. She was informed of the risk of over-replacement of vitamin D and agrees to not increase her dose unless she discusses this with [place] first. We will check labs and Kaylyn agrees to follow up with our clinic in 2 weeks.  Obesity Jaydah is currently in the action stage of change. As such, her goal is to continue with weight loss efforts She has agreed to change to follow the Category 3 plan Maia has been instructed to work up to a goal of 150 minutes of combined cardio and strengthening exercise per week for weight loss and overall health benefits. We discussed the following Behavioral Modification Strategies today: increasing lean protein intake and no skipping meals   Jazmarie has agreed to follow up with our clinic in 2 weeks. She was informed of the importance of frequent follow up visits to maximize her success with intensive lifestyle modifications for her multiple health conditions.   Wilhemena Durie, am acting as transcriptionist for Lacy Duverney, PA-C I, Lacy Duverney [facility], have reviewed this note and agree with its content.

## 2017-08-19 LAB — SPECIMEN STATUS

## 2017-08-20 LAB — COMPREHENSIVE METABOLIC PANEL
ALBUMIN: 4.4 g/dL (ref 3.5–5.5)
ALT: 22 IU/L (ref 0–32)
AST: 16 IU/L (ref 0–40)
Albumin/Globulin Ratio: 1.8 (ref 1.2–2.2)
Alkaline Phosphatase: 47 IU/L (ref 39–117)
BUN / CREAT RATIO: 17 (ref 9–23)
BUN: 12 mg/dL (ref 6–24)
Bilirubin Total: 0.9 mg/dL (ref 0.0–1.2)
CALCIUM: 9.4 mg/dL (ref 8.7–10.2)
CO2: 25 mmol/L (ref 20–29)
CREATININE: 0.7 mg/dL (ref 0.57–1.00)
Chloride: 101 mmol/L (ref 96–106)
GFR, EST AFRICAN AMERICAN: 111 mL/min/{1.73_m2} (ref >59)
GFR, EST NON AFRICAN AMERICAN: 96 mL/min/{1.73_m2} (ref >59)
GLOBULIN, TOTAL: 2.5 g/dL (ref 1.5–4.5)
GLUCOSE: 89 mg/dL (ref 65–99)
Potassium: 4 mmol/L (ref 3.5–5.2)
SODIUM: 142 mmol/L (ref 134–144)
TOTAL PROTEIN: 6.9 g/dL (ref 6.0–8.5)

## 2017-08-20 LAB — LIPID PANEL WITH LDL/HDL RATIO
Cholesterol, Total: 180 mg/dL (ref 100–199)
HDL: 60 mg/dL (ref >39)
LDL CALC: 103 mg/dL — AB (ref 0–99)
LDL/HDL RATIO: 1.7 ratio (ref 0.0–3.2)
Triglycerides: 87 mg/dL (ref 0–149)
VLDL CHOLESTEROL CAL: 17 mg/dL (ref 5–40)

## 2017-08-20 LAB — VITAMIN D 25 HYDROXY (VIT D DEFICIENCY, FRACTURES): Vit D, 25-Hydroxy: 49.1 ng/mL (ref 30.0–100.0)

## 2017-09-02 ENCOUNTER — Ambulatory Visit (INDEPENDENT_AMBULATORY_CARE_PROVIDER_SITE_OTHER): Payer: 59 | Admitting: Physician Assistant

## 2017-09-02 VITALS — BP 138/97 | HR 71 | Temp 98.2°F | Ht 68.0 in | Wt 208.0 lb

## 2017-09-02 DIAGNOSIS — Z9189 Other specified personal risk factors, not elsewhere classified: Secondary | ICD-10-CM

## 2017-09-02 DIAGNOSIS — E669 Obesity, unspecified: Secondary | ICD-10-CM

## 2017-09-02 DIAGNOSIS — F3289 Other specified depressive episodes: Secondary | ICD-10-CM | POA: Diagnosis not present

## 2017-09-02 DIAGNOSIS — I10 Essential (primary) hypertension: Secondary | ICD-10-CM | POA: Diagnosis not present

## 2017-09-02 DIAGNOSIS — Z6831 Body mass index (BMI) 31.0-31.9, adult: Secondary | ICD-10-CM | POA: Diagnosis not present

## 2017-09-02 MED ORDER — BUPROPION HCL ER (SR) 150 MG PO TB12: 150.0000 mg | ORAL_TABLET | Freq: Every day | ORAL | 0 refills | Status: DC

## 2017-09-02 NOTE — Progress Notes (Signed)
Office: [phone]  /  Fax: [phone]   HPI:   Chief Complaint: OBESITY Kathy Clark is here to discuss her progress with her obesity treatment plan. She is on the  keep a food journal with 1400 calories  calories and 80 grams of protein  and is following her eating plan approximately 90 % of the time. She states she is walking for 30 minutes 5 times per week. Kathy Clark continues to do well with weight loss. Although she is dealing with with stress at home, she manages to be mindful of her eating and controls her emotional eating. She journals her meals and keeps up with her protein intake. Her weight is 208 lb (94.3 kg) today and has had a weight loss of 1 pound over a period of 2 weeks since her last visit. She has lost 29 lbs since starting treatment with [place].  Hypertension Kathy Clark is a 57 y.o. female with hypertension.  Kathy Clark denies chest pain or shortness of breath on exertion. She is working weight loss to help control her blood pressure with the goal of decreasing her risk of heart attack and stroke. [name] blood pressure is not currently controlled. She states she is under stress at home.   Depression with emotional eating behaviors Kathy Clark is struggling with emotional eating and using food for comfort to the extent that it is negatively impacting her health. She often snacks when she is not hungry. Kathy Clark sometimes feels she is out of control and then feels guilty that she made poor food choices. She has been working on behavior modification techniques to help reduce her emotional eating and has been somewhat successful. She shows no sign of suicidal or homicidal ideations.  Depression screen Kathy Clark 2/9 11/16/2016 08/03/2016 01/21/2016 08/26/2015 07/11/2015  Decreased Interest 2 3 0 1 1  Down, Depressed, Hopeless 2 3 0 1 2  PHQ - 2 Score 4 6 0 2 3  Altered sleeping 3 3 2 2 2   Tired, decreased energy 3 3 2 2 2   Change in appetite 1 3 1  0 2  Feeling bad or failure about yourself   1 3 0 0 1  Trouble concentrating 2 2 1  0 1  Moving slowly or fidgety/restless 1 0 0 0 0  Suicidal thoughts 0 0 0 0 0  PHQ-9 Score 15 20 6 6 11   Difficult doing work/chores - - Somewhat difficult Somewhat difficult Somewhat difficult    At risk for cardiovascular disease Kathy Clark is at a higher than average risk for cardiovascular disease due to obesity. She currently denies any chest pain.   ALLERGIES: Allergies  Allergen Reactions  . Succinylcholine Anaphylaxis  . Dilaudid [Hydromorphone Hcl] Nausea And Vomiting    MEDICATIONS: Current Outpatient Medications on File Prior to Visit  Medication Sig Dispense Refill  . AMBULATORY NON FORMULARY MEDICATION Medication Name: Please decrease CPAP setting to 10 cm of water pressure and fax [place] a download after 10 days.  Is having a lot of difficulty with air leaks is working to try to reduce her pressure to see if she is still being adequately treated but with fewer leaks. 1 vial 0  . buPROPion (WELLBUTRIN SR) 150 MG 12 hr tablet Take 1 tablet (150 mg total) by mouth daily. 30 tablet 0  . clonazePAM (KLONOPIN) 0.25 MG disintegrating tablet Take 1 tablet (0.25 mg total) by mouth daily as needed for seizure. 20 tablet 0  . estradiol (VIVELLE-DOT) 0.075 MG/24HR   3  . gabapentin (  NEURONTIN) 300 MG capsule Take 300 mg by mouth 2 (two) times daily.     . hydrochlorothiazide (MICROZIDE) 12.5 MG capsule TAKE 1 CAPSULE (12.5 MG TOTAL) BY MOUTH DAILY. 90 capsule 1  . losartan (COZAAR) 25 MG tablet Take 1 tablet (25 mg total) by mouth daily. 90 tablet 3  . Melatonin 3 MG TABS Take by mouth as directed.    . Multiple Vitamins-Minerals (MULTIVITAMIN ADULT PO) Take by mouth daily.    . predniSONE (STERAPRED UNI-PAK 21 TAB) 10 MG (21) TBPK tablet As directed 21 tablet 0  . SUMAtriptan (IMITREX) 20 MG/ACT nasal spray PLACE 1 SPRAY INTO THE NOSE EVERY 2 HOURS AS NEEDED FOR. HEADACHE 6 Inhaler 2  . VIIBRYD 20 MG TABS TAKE 1 TABLET (20 MG TOTAL) BY MOUTH DAILY.  90 tablet 2  . Vitamin D, Ergocalciferol, (DRISDOL) 50000 units CAPS capsule Take 1 capsule (50,000 Units total) by mouth every 7 (seven) days. 4 capsule 0  . [DISCONTINUED] rivaroxaban (XARELTO) 10 MG TABS tablet Take 1 tablet (10 mg total) by mouth daily with breakfast. 18 tablet 0   No current facility-administered medications on file prior to visit.     PAST MEDICAL HISTORY: Past Medical History:  Diagnosis Date  . Anxiety   . Back pain   . 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 Depression   . Essential hypertension, benign   . Gastritis   . GERD (gastroesophageal reflux disease)   . Hypertension   . Joint pain   . Lymphocytic colitis    microscopic  . Migraines   . Osteoarthritis   . Sciatica    right leg  . Swelling    feet, left foot    PAST SURGICAL HISTORY: Past Surgical History:  Procedure Laterality Date  . ABDOMINAL HYSTERECTOMY  07-2005  . [place] Bunionectomy Left 09/18/2016   Left Foot  . BACK SURGERY    . KNEE SURGERY  2006   left; multiple  . microdiscetomy  2005  . SHOULDER SURGERY  07-22-09   right  . SKIN CANCER EXCISION     eyelid  . TOTAL KNEE ARTHROPLASTY  07/27/2011   Procedure: TOTAL KNEE ARTHROPLASTY;  Surgeon: Gearlean Alf;  Location: [facility];  Service: Orthopedics;  Laterality: Left;    SOCIAL HISTORY: Social History   Tobacco Use  . Smoking status: Never Smoker  . Smokeless tobacco: Never Used  Substance Use Topics  . Alcohol use: Yes    Comment: occasionally  . Drug use: No    FAMILY HISTORY: Family History  Problem Relation Age of Onset  . Breast cancer Unknown   . Tongue cancer Mother   . Breast cancer Mother   . Anxiety disorder Mother   . Depression Mother   . Heart disease Father   . Hypertension Father   . Hyperlipidemia Father   . Stroke Paternal Grandmother   . Colon cancer Neg Hx     ROS: Review of Systems  Constitutional: Positive for weight loss.  Respiratory: Negative for  shortness of breath.   Cardiovascular: Negative for chest pain.  Psychiatric/Behavioral: Positive for depression. Negative for suicidal ideas.       Negative for homicidal ideations     PHYSICAL EXAM: Blood pressure (!) 138/97, pulse 71, temperature 98.2 F (36.8 C), temperature source Oral, height 5\' 8"  (1.727 m), weight 208 lb (94.3 kg), SpO2 97 %. Body mass index is 31.63 kg/m. Physical Exam  Constitutional: She is oriented to person,  place, and time. She appears well-developed and well-nourished.  HENT:  Head: Normocephalic.  Eyes: EOM are normal.  Neck: Normal range of motion.  Cardiovascular: Normal rate.  Pulmonary/Chest: Effort normal.  Musculoskeletal: Normal range of motion.  Neurological: She is alert and oriented to person, place, and time.  Skin: Skin is warm and dry.  Psychiatric: She has a normal mood and affect. Her behavior is normal.  Vitals reviewed.   RECENT LABS AND TESTS: BMET    Component Value Date/Time   NA 142 08/18/2017 0940   K 4.0 08/18/2017 0940   CL 101 08/18/2017 0940   CO2 25 08/18/2017 0940   GLUCOSE 89 08/18/2017 0940   GLUCOSE 79 03/26/2016 1600   BUN 12 08/18/2017 0940   CREATININE 0.70 08/18/2017 0940   CREATININE 0.61 03/26/2016 1600   CALCIUM 9.4 08/18/2017 0940   GFRNONAA 96 08/18/2017 0940   GFRNONAA >89 03/26/2016 1600   GFRAA 111 08/18/2017 0940   GFRAA >89 03/26/2016 1600   Lab Results  Component Value Date   HGBA1C 5.1 12/01/2016   HGBA1C 4.9 08/03/2016   Lab Results  Component Value Date   INSULIN 5.4 12/01/2016   INSULIN 5.9 08/03/2016   CBC    Component Value Date/Time   WBC WILL FOLLOW 08/18/2017 0940   WBC 6.7 03/26/2016 1600   RBC WILL FOLLOW 08/18/2017 0940   RBC 4.68 03/26/2016 1600   HGB WILL FOLLOW 08/18/2017 0940   HCT WILL FOLLOW 08/18/2017 0940   PLT WILL FOLLOW 08/18/2017 0940   MCV WILL FOLLOW 08/18/2017 0940   MCH WILL FOLLOW 08/18/2017 0940   MCH 30.6 03/26/2016 1600   MCHC WILL FOLLOW  08/18/2017 0940   MCHC 33.6 03/26/2016 1600   RDW WILL FOLLOW 08/18/2017 0940   LYMPHSABS WILL FOLLOW 08/18/2017 0940   MONOABS 469 03/26/2016 1600   EOSABS WILL FOLLOW 08/18/2017 0940   BASOSABS WILL FOLLOW 08/18/2017 0940   Iron/TIBC/Ferritin/ %Sat    Component Value Date/Time   FERRITIN 142 12/25/2015 0912   Lipid Panel     Component Value Date/Time   CHOL 180 08/18/2017 0940   TRIG 87 08/18/2017 0940   HDL 60 08/18/2017 0940   CHOLHDL 3.0 04/05/2015 0937   VLDL 21 04/05/2015 0937   LDLCALC 103 (H) 08/18/2017 0940   Hepatic Function Panel     Component Value Date/Time   PROT 6.9 08/18/2017 0940   ALBUMIN 4.4 08/18/2017 0940   AST 16 08/18/2017 0940   ALT 22 08/18/2017 0940   ALKPHOS 47 08/18/2017 0940   BILITOT 0.9 08/18/2017 0940   BILIDIR 0.1 12/25/2015 0912   IBILI 0.5 12/25/2015 0912      Component Value Date/Time   TSH 1.470 08/03/2016 1458   TSH 2.09 03/26/2016 1600   TSH 3.10 12/25/2015 0912    ASSESSMENT AND PLAN: Essential hypertension  Other depression - with emotional eating - Plan: buPROPion (WELLBUTRIN SR) 150 MG 12 hr tablet  At risk for heart disease  Class 1 obesity with serious comorbidity and body mass index (BMI) of 31.0 to 31.9 in adult, unspecified obesity type  PLAN: Hypertension We discussed sodium restriction, working on healthy weight loss, and a regular exercise program as the means to achieve improved blood pressure control. Emmalin agreed with this plan and agreed to follow up as directed. We will continue to monitor her blood pressure as well as her progress with the above lifestyle modifications. She will continue her medications as prescribed and will watch for signs  of hypotension as she continues her lifestyle modifications.  Depression with Emotional Eating Behaviors We discussed behavior modification techniques today to help Kathy Clark deal with her emotional eating and depression. She has agreed to take Wellbutrin SR 150 mg qd  #30 with no refills and agreed to follow up as directed.  Cardiovascular risk counseling Kathy Clark was given extended (15 minutes) coronary artery disease prevention counseling today. She is 57 y.o. female and has risk factors for heart disease including obesity. We discussed intensive lifestyle modifications today with an emphasis on specific weight loss instructions and strategies. Pt was also informed of the importance of increasing exercise and decreasing saturated fats to help prevent heart disease.  Obesity Kathy Clark is currently in the action stage of change. As such, her goal is to continue with weight loss efforts She has agreed to keep a food journal with 1400 calories calories and 80 grams of protein  Kathy Clark has been instructed to work up to a goal of 150 minutes of combined cardio and strengthening exercise per week for weight loss and overall health benefits. We discussed the following Behavioral Modification Strategies today: keeping healthy food in the home and keeping a strict food journal.    Kathy Clark has agreed to follow up with our clinic in 2 weeks. She was informed of the importance of frequent follow up visits to maximize her success with intensive lifestyle modifications for her multiple health conditions.      ASK: We discussed the diagnosis of obesity with Kathy Clark today and Kathy Clark agreed to give [place] permission to discuss obesity behavioral modification therapy today.  ASSESS: Kathy Clark has the diagnosis of obesity and her BMI today is 31.63 Chiffon is in the action stage of change   ADVISE: Deniece was educated on the multiple health risks of obesity as well as the benefit of weight loss to improve her health. She was advised of the need for long term treatment and the importance of lifestyle modifications.  AGREE: Multiple dietary modification options and treatment options were discussed and  Laiklyn agreed to the above obesity treatment plan.   Leary Roca, am  acting as transcriptionist for [employer], PA-C I, Lacy Duverney [facility], have reviewed this note and agree with its content.

## 2017-09-03 MED FILL — BUPROPION SR 150 MG TABLET: 150 | 30 days supply | Qty: 30 | Fill #0

## 2017-09-03 MED FILL — VIIBRYD 20 MG TABLET: 20 | 30 days supply | Qty: 30 | Fill #3

## 2017-09-20 ENCOUNTER — Ambulatory Visit (INDEPENDENT_AMBULATORY_CARE_PROVIDER_SITE_OTHER): Payer: 59 | Admitting: Physician Assistant

## 2017-09-20 VITALS — BP 106/72 | HR 67 | Temp 97.7°F | Ht 68.0 in | Wt 206.0 lb

## 2017-09-20 DIAGNOSIS — F3289 Other specified depressive episodes: Secondary | ICD-10-CM

## 2017-09-20 DIAGNOSIS — E669 Obesity, unspecified: Secondary | ICD-10-CM

## 2017-09-20 DIAGNOSIS — Z6831 Body mass index (BMI) 31.0-31.9, adult: Secondary | ICD-10-CM | POA: Diagnosis not present

## 2017-09-20 DIAGNOSIS — E559 Vitamin D deficiency, unspecified: Secondary | ICD-10-CM | POA: Diagnosis not present

## 2017-09-20 DIAGNOSIS — Z9189 Other specified personal risk factors, not elsewhere classified: Secondary | ICD-10-CM

## 2017-09-20 MED ORDER — BUPROPION HCL ER (SR) 150 MG PO TB12: 150.0000 mg | ORAL_TABLET | Freq: Every day | ORAL | 0 refills | Status: DC

## 2017-09-20 MED ORDER — VITAMIN D (ERGOCALCIFEROL) 1.25 MG (50000 UNIT) PO CAPS: 50000.0000 [IU] | ORAL_CAPSULE | ORAL | 0 refills | Status: DC

## 2017-09-20 NOTE — Progress Notes (Signed)
Office: [phone]  /  Fax: [phone]   HPI:   Chief Complaint: OBESITY Kathy Clark is here to discuss her progress with her obesity treatment plan. She is on the keep a food journal with 1400 calories and 80 grams of protein daily and is following her eating plan approximately 90 % of the time. She states she is walking 30 minutes 3 to 5 times per week. Kathy Clark continues to do well with weight loss, despite going through difficult times with her spouse. She manages to pay attention to her eating and make better food choices. Her weight is 206 lb (93.4 kg) today and has had a weight loss of 2 pounds over a period of 2 weeks since her last visit. She has lost 31 lbs since starting treatment with [place].  Vitamin D deficiency Kathy Clark has a diagnosis of vitamin D deficiency. She is currently taking vit D and denies nausea, vomiting or muscle weakness.   Ref. Range 08/18/2017 09:40  Vitamin D, 25-Hydroxy Latest Ref Range: 30.0 - 100.0 ng/mL 49.1   .At risk for osteopenia and osteoporosis Kathy Clark is at higher risk of osteopenia and osteoporosis due to vitamin D deficiency.   Depression with emotional eating behaviors Kathy Clark is struggling with emotional eating and using food for comfort to the extent that it is negatively impacting her health. She often snacks when she is not hungry. Kathy Clark sometimes feels she is out of control and then feels guilty that she made poor food choices. She has been working on behavior modification techniques to help reduce her emotional eating and has been somewhat successful. Her mood is stable and she shows no sign of suicidal or homicidal ideations.  Depression screen [facility] 2/9 11/16/2016 08/03/2016 01/21/2016 08/26/2015 07/11/2015  Decreased Interest 2 3 0 1 1  Down, Depressed, Hopeless 2 3 0 1 2  PHQ - 2 Score 4 6 0 2 3  Altered sleeping 3 3 2 2 2   Tired, decreased energy 3 3 2 2 2   Change in appetite 1 3 1  0 2  Feeling bad or failure about yourself  1 3 0 0 1  Trouble  concentrating 2 2 1  0 1  Moving slowly or fidgety/restless 1 0 0 0 0  Suicidal thoughts 0 0 0 0 0  PHQ-9 Score 15 20 6 6 11   Difficult doing work/chores - - Somewhat difficult Somewhat difficult Somewhat difficult     ALLERGIES: Allergies  Allergen Reactions  . Succinylcholine Anaphylaxis  . Dilaudid [Hydromorphone Hcl] Nausea And Vomiting    MEDICATIONS: Current Outpatient Medications on File Prior to Visit  Medication Sig Dispense Refill  . AMBULATORY NON FORMULARY MEDICATION Medication Name: Please decrease CPAP setting to 10 cm of water pressure and fax [place] a download after 10 days.  Is having a lot of difficulty with air leaks is working to try to reduce her pressure to see if she is still being adequately treated but with fewer leaks. 1 vial 0  . buPROPion (WELLBUTRIN SR) 150 MG 12 hr tablet Take 1 tablet (150 mg total) by mouth daily. 30 tablet 0  . clonazePAM (KLONOPIN) 0.25 MG disintegrating tablet Take 1 tablet (0.25 mg total) by mouth daily as needed for seizure. 20 tablet 0  . estradiol (VIVELLE-DOT) 0.075 MG/24HR   3  . gabapentin (NEURONTIN) 300 MG capsule Take 300 mg by mouth 2 (two) times daily.     . hydrochlorothiazide (MICROZIDE) 12.5 MG capsule TAKE 1 CAPSULE (12.5 MG TOTAL) BY MOUTH DAILY. [place]  capsule 1  . losartan (COZAAR) 25 MG tablet Take 1 tablet (25 mg total) by mouth daily. 90 tablet 3  . Melatonin 3 MG TABS Take by mouth as directed.    . Multiple Vitamins-Minerals (MULTIVITAMIN ADULT PO) Take by mouth daily.    . predniSONE (STERAPRED UNI-PAK 21 TAB) 10 MG (21) TBPK tablet As directed 21 tablet 0  . SUMAtriptan (IMITREX) 20 MG/ACT nasal spray PLACE 1 SPRAY INTO THE NOSE EVERY 2 HOURS AS NEEDED FOR. HEADACHE 6 Inhaler 2  . VIIBRYD 20 MG TABS TAKE 1 TABLET (20 MG TOTAL) BY MOUTH DAILY. 90 tablet 2  . Vitamin D, Ergocalciferol, (DRISDOL) 50000 units CAPS capsule Take 1 capsule (50,000 Units total) by mouth every 7 (seven) days. 4 capsule 0  . [DISCONTINUED]  rivaroxaban (XARELTO) 10 MG TABS tablet Take 1 tablet (10 mg total) by mouth daily with breakfast. 18 tablet 0   No current facility-administered medications on file prior to visit.     PAST MEDICAL HISTORY: Past Medical History:  Diagnosis Date  . Anxiety   . Back pain   . 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 Depression   . Essential hypertension, benign   . Gastritis   . GERD (gastroesophageal reflux disease)   . Hypertension   . Joint pain   . Lymphocytic colitis    microscopic  . Migraines   . Osteoarthritis   . Sciatica    right leg  . Swelling    feet, left foot    PAST SURGICAL HISTORY: Past Surgical History:  Procedure Laterality Date  . ABDOMINAL HYSTERECTOMY  07-2005  . [place] Bunionectomy Left 09/18/2016   Left Foot  . BACK SURGERY    . KNEE SURGERY  2006   left; multiple  . microdiscetomy  2005  . SHOULDER SURGERY  07-22-09   right  . SKIN CANCER EXCISION     eyelid  . TOTAL KNEE ARTHROPLASTY  07/27/2011   Procedure: TOTAL KNEE ARTHROPLASTY;  Surgeon: Gearlean Alf;  Location: [facility];  Service: Orthopedics;  Laterality: Left;    SOCIAL HISTORY: Social History   Tobacco Use  . Smoking status: Never Smoker  . Smokeless tobacco: Never Used  Substance Use Topics  . Alcohol use: Yes    Comment: occasionally  . Drug use: No    FAMILY HISTORY: Family History  Problem Relation Age of Onset  . Breast cancer Unknown   . Tongue cancer Mother   . Breast cancer Mother   . Anxiety disorder Mother   . Depression Mother   . Heart disease Father   . Hypertension Father   . Hyperlipidemia Father   . Stroke Paternal Grandmother   . Colon cancer Neg Hx     ROS: Review of Systems  Constitutional: Positive for weight loss.  Gastrointestinal: Negative for nausea and vomiting.  Musculoskeletal:       Negative for muscle weakness  Psychiatric/Behavioral: Positive for depression. Negative for suicidal ideas.    PHYSICAL  EXAM: Blood pressure 106/72, pulse 67, temperature 97.7 F (36.5 C), temperature source Oral, height 5\' 8"  (1.727 m), weight 206 lb (93.4 kg), SpO2 99 %. Body mass index is 31.32 kg/m. Physical Exam  Constitutional: She is oriented to person, place, and time. She appears well-developed and well-nourished.  Cardiovascular: Normal rate.  Pulmonary/Chest: Effort normal.  Musculoskeletal: Normal range of motion.  Neurological: She is oriented to person, place, and time.  Skin: Skin is warm and dry.  Psychiatric: She has a normal mood and affect. Her behavior is normal.  Vitals reviewed.   RECENT LABS AND TESTS: BMET    Component Value Date/Time   NA 142 08/18/2017 0940   K 4.0 08/18/2017 0940   CL 101 08/18/2017 0940   CO2 25 08/18/2017 0940   GLUCOSE 89 08/18/2017 0940   GLUCOSE 79 03/26/2016 1600   BUN 12 08/18/2017 0940   CREATININE 0.70 08/18/2017 0940   CREATININE 0.61 03/26/2016 1600   CALCIUM 9.4 08/18/2017 0940   GFRNONAA 96 08/18/2017 0940   GFRNONAA >89 03/26/2016 1600   GFRAA 111 08/18/2017 0940   GFRAA >89 03/26/2016 1600   Lab Results  Component Value Date   HGBA1C 5.1 12/01/2016   HGBA1C 4.9 08/03/2016   Lab Results  Component Value Date   INSULIN 5.4 12/01/2016   INSULIN 5.9 08/03/2016   CBC    Component Value Date/Time   WBC WILL FOLLOW 08/18/2017 0940   WBC 6.7 03/26/2016 1600   RBC WILL FOLLOW 08/18/2017 0940   RBC 4.68 03/26/2016 1600   HGB WILL FOLLOW 08/18/2017 0940   HCT WILL FOLLOW 08/18/2017 0940   PLT WILL FOLLOW 08/18/2017 0940   MCV WILL FOLLOW 08/18/2017 0940   MCH WILL FOLLOW 08/18/2017 0940   MCH 30.6 03/26/2016 1600   MCHC WILL FOLLOW 08/18/2017 0940   MCHC 33.6 03/26/2016 1600   RDW WILL FOLLOW 08/18/2017 0940   LYMPHSABS WILL FOLLOW 08/18/2017 0940   MONOABS 469 03/26/2016 1600   EOSABS WILL FOLLOW 08/18/2017 0940   BASOSABS WILL FOLLOW 08/18/2017 0940   Iron/TIBC/Ferritin/ %Sat    Component Value Date/Time   FERRITIN  142 12/25/2015 0912   Lipid Panel     Component Value Date/Time   CHOL 180 08/18/2017 0940   TRIG 87 08/18/2017 0940   HDL 60 08/18/2017 0940   CHOLHDL 3.0 04/05/2015 0937   VLDL 21 04/05/2015 0937   LDLCALC 103 (H) 08/18/2017 0940   Hepatic Function Panel     Component Value Date/Time   PROT 6.9 08/18/2017 0940   ALBUMIN 4.4 08/18/2017 0940   AST 16 08/18/2017 0940   ALT 22 08/18/2017 0940   ALKPHOS 47 08/18/2017 0940   BILITOT 0.9 08/18/2017 0940   BILIDIR 0.1 12/25/2015 0912   IBILI 0.5 12/25/2015 0912      Component Value Date/Time   TSH 1.470 08/03/2016 1458   TSH 2.09 03/26/2016 1600   TSH 3.10 12/25/2015 0912    Ref. Range 08/18/2017 09:40  Vitamin D, 25-Hydroxy Latest Ref Range: 30.0 - 100.0 ng/mL 49.1   ASSESSMENT AND PLAN: Vitamin D deficiency - Plan: Vitamin D, Ergocalciferol, (DRISDOL) 50000 units CAPS capsule  Other depression - with emotional eating - Plan: buPROPion (WELLBUTRIN SR) 150 MG 12 hr tablet  At risk for osteoporosis  Class 1 obesity with serious comorbidity and body mass index (BMI) of 31.0 to 31.9 in adult, unspecified obesity type  PLAN:  Vitamin D Deficiency Kathy Clark was informed that low vitamin D levels contributes to fatigue and are associated with obesity, breast, and colon cancer. She agrees to continue to take prescription Vit D @50 ,000 IU every week #4 with no refills and will follow up for routine testing of vitamin D, at least 2-3 times per year. She was informed of the risk of over-replacement of vitamin D and agrees to not increase her dose unless she discusses this with [place] first. Kathy Clark agrees to follow up with our clinic in 2 weeks.  At risk  for osteopenia and osteoporosis Kathy Clark is at risk for osteopenia and osteoporosis due to her vitamin D deficiency. She was encouraged to take her vitamin D and follow her higher calcium diet and increase strengthening exercise to help strengthen her bones and decrease her risk of osteopenia and  osteoporosis.  Depression with Emotional Eating Behaviors We discussed behavior modification techniques today to help Kathy Clark deal with her emotional eating and depression. She has agreed to continue Wellbutrin SR 150 mg qd #30 with no refills and follow up as directed.  Obesity Journei is currently in the action stage of change. As such, her goal is to continue with weight loss efforts She has agreed to keep a food journal with 1400 calories and 80 grams of protein daily Kinslei has been instructed to work up to a goal of 150 minutes of combined cardio and strengthening exercise per week for weight loss and overall health benefits. We discussed the following Behavioral Modification Strategies today: increasing lean protein intake and work on meal planning and easy cooking plans  Keyuna has agreed to follow up with our clinic in 2 weeks. She was informed of the importance of frequent follow up visits to maximize her success with intensive lifestyle modifications for her multiple health conditions.   [name], am acting as transcriptionist for [employer], PA-C I, Lacy Duverney [facility], have reviewed this note and agree with its content

## 2017-09-21 ENCOUNTER — Ambulatory Visit (INDEPENDENT_AMBULATORY_CARE_PROVIDER_SITE_OTHER): Payer: 59 | Admitting: Physician Assistant

## 2017-09-21 DIAGNOSIS — Z1231 Encounter for screening mammogram for malignant neoplasm of breast: Secondary | ICD-10-CM | POA: Diagnosis not present

## 2017-09-21 DIAGNOSIS — Z803 Family history of malignant neoplasm of breast: Secondary | ICD-10-CM | POA: Diagnosis not present

## 2017-09-21 LAB — HM MAMMOGRAPHY

## 2017-09-21 MED FILL — VIT D2 1.25 MG (50,000 UNIT: 1.25 MG | 28 days supply | Qty: 4 | Fill #0

## 2017-09-27 MED FILL — BUPROPION SR 150 MG TABLET: 150 | 30 days supply | Qty: 30 | Fill #0

## 2017-09-27 MED FILL — ESTRADIOL 0.075 MG PATCH: 0.075 | 84 days supply | Qty: 24 | Fill #1

## 2017-09-27 MED FILL — VIIBRYD 20 MG TABLET: 20 | 30 days supply | Qty: 30 | Fill #4

## 2017-09-28 ENCOUNTER — Ambulatory Visit (INDEPENDENT_AMBULATORY_CARE_PROVIDER_SITE_OTHER): Payer: 59 | Admitting: Physician Assistant

## 2017-10-05 ENCOUNTER — Ambulatory Visit (INDEPENDENT_AMBULATORY_CARE_PROVIDER_SITE_OTHER): Payer: 59 | Admitting: Physician Assistant

## 2017-10-05 VITALS — BP 128/81 | HR 68 | Temp 98.1°F | Ht 68.0 in | Wt 206.0 lb

## 2017-10-05 DIAGNOSIS — E559 Vitamin D deficiency, unspecified: Secondary | ICD-10-CM

## 2017-10-05 DIAGNOSIS — Z6831 Body mass index (BMI) 31.0-31.9, adult: Secondary | ICD-10-CM | POA: Diagnosis not present

## 2017-10-05 DIAGNOSIS — Z9189 Other specified personal risk factors, not elsewhere classified: Secondary | ICD-10-CM

## 2017-10-05 DIAGNOSIS — E669 Obesity, unspecified: Secondary | ICD-10-CM | POA: Diagnosis not present

## 2017-10-05 DIAGNOSIS — F3289 Other specified depressive episodes: Secondary | ICD-10-CM

## 2017-10-05 MED ORDER — VITAMIN D (ERGOCALCIFEROL) 1.25 MG (50000 UNIT) PO CAPS
50000.0000 [IU] | ORAL_CAPSULE | ORAL | 0 refills | Status: DC
Start: 2017-10-05 — End: 2017-10-18

## 2017-10-05 NOTE — Progress Notes (Signed)
Office: [phone]  /  Fax: [phone]   HPI:   Chief Complaint: OBESITY Kathy Clark is here to discuss her progress with her obesity treatment plan. She is on the keep a food journal with 1400 calories and 80 grams of protein daily and is following her eating plan approximately 90 % of the time. She states she is walking for 30 minutes 4 times per week. Mina maintained her weight. She continues to be mindful of her eating and keeps a strict food journal.  Her weight is 206 lb (93.4 kg) today and has not lost weight since her last visit. She has lost 31 lbs since starting treatment with Kathy Clark.  Vitamin D Deficiency Kathy Clark has a diagnosis of vitamin D deficiency. She is currently taking prescription Vit D and denies nausea, vomiting or muscle weakness.  At risk for osteopenia and osteoporosis Kathy Clark is at higher risk of osteopenia and osteoporosis due to vitamin D deficiency.   Depression with emotional eating behaviors Kathy Clark is struggling with emotional eating and using food for comfort to the extent that it is negatively impacting her health. She often snacks when she is not hungry. Kathy Clark sometimes feels she is out of control and then feels guilty that she made poor food choices. She has been working on behavior modification techniques to help reduce her emotional eating and has been somewhat successful. Her mood is stable on Wellbutrin and she shows no sign of suicidal or homicidal ideations.  Depression screen [facility] 2/9 11/16/2016 08/03/2016 01/21/2016 08/26/2015 07/11/2015  Decreased Interest 2 3 0 1 1  Down, Depressed, Hopeless 2 3 0 1 2  PHQ - 2 Score 4 6 0 2 3  Altered sleeping 3 3 2 2 2   Tired, decreased energy 3 3 2 2 2   Change in appetite 1 3 1  0 2  Feeling bad or failure about yourself  1 3 0 0 1  Trouble concentrating 2 2 1  0 1  Moving slowly or fidgety/restless 1 0 0 0 0  Suicidal thoughts 0 0 0 0 0  PHQ-9 Score 15 20 6 6 11   Difficult doing work/chores - - Somewhat difficult  Somewhat difficult Somewhat difficult   ALLERGIES: Allergies  Allergen Reactions  . Succinylcholine Anaphylaxis  . Dilaudid [Hydromorphone Hcl] Nausea And Vomiting    MEDICATIONS: Current Outpatient Medications on File Prior to Visit  Medication Sig Dispense Refill  . AMBULATORY NON FORMULARY MEDICATION Medication Name: Please decrease CPAP setting to 10 cm of water pressure and fax Kathy Clark a download after 10 days.  Is having a lot of difficulty with air leaks is working to try to reduce her pressure to see if she is still being adequately treated but with fewer leaks. 1 vial 0  . buPROPion (WELLBUTRIN SR) 150 MG 12 hr tablet Take 1 tablet (150 mg total) by mouth daily. 30 tablet 0  . clonazePAM (KLONOPIN) 0.25 MG disintegrating tablet Take 1 tablet (0.25 mg total) by mouth daily as needed for seizure. 20 tablet 0  . estradiol (VIVELLE-DOT) 0.075 MG/24HR   3  . gabapentin (NEURONTIN) 300 MG capsule Take 300 mg by mouth 2 (two) times daily.     . hydrochlorothiazide (MICROZIDE) 12.5 MG capsule TAKE 1 CAPSULE (12.5 MG TOTAL) BY MOUTH DAILY. 90 capsule 1  . losartan (COZAAR) 25 MG tablet Take 1 tablet (25 mg total) by mouth daily. 90 tablet 3  . Melatonin 3 MG TABS Take by mouth as directed.    . Multiple Vitamins-Minerals (MULTIVITAMIN  ADULT PO) Take by mouth daily.    . SUMAtriptan (IMITREX) 20 MG/ACT nasal spray PLACE 1 SPRAY INTO THE NOSE EVERY 2 HOURS AS NEEDED FOR. HEADACHE 6 Inhaler 2  . VIIBRYD 20 MG TABS TAKE 1 TABLET (20 MG TOTAL) BY MOUTH DAILY. 90 tablet 2  . Vitamin D, Ergocalciferol, (DRISDOL) 50000 units CAPS capsule Take 1 capsule (50,000 Units total) by mouth every 7 (seven) days. 4 capsule 0  . [DISCONTINUED] rivaroxaban (XARELTO) 10 MG TABS tablet Take 1 tablet (10 mg total) by mouth daily with breakfast. 18 tablet 0   No current facility-administered medications on file prior to visit.     PAST MEDICAL HISTORY: Past Medical History:  Diagnosis Date  . Anxiety   . Back  pain   . 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 Depression   . Essential hypertension, benign   . Gastritis   . GERD (gastroesophageal reflux disease)   . Hypertension   . Joint pain   . Lymphocytic colitis    microscopic  . Migraines   . Osteoarthritis   . Sciatica    right leg  . Swelling    feet, left foot    PAST SURGICAL HISTORY: Past Surgical History:  Procedure Laterality Date  . ABDOMINAL HYSTERECTOMY  07-2005  . [place] Bunionectomy Left 09/18/2016   Left Foot  . BACK SURGERY    . KNEE SURGERY  2006   left; multiple  . microdiscetomy  2005  . SHOULDER SURGERY  07-22-09   right  . SKIN CANCER EXCISION     eyelid  . TOTAL KNEE ARTHROPLASTY  07/27/2011   Procedure: TOTAL KNEE ARTHROPLASTY;  Surgeon: Gearlean Alf;  Location: [facility];  Service: Orthopedics;  Laterality: Left;    SOCIAL HISTORY: Social History   Tobacco Use  . Smoking status: Never Smoker  . Smokeless tobacco: Never Used  Substance Use Topics  . Alcohol use: Yes    Comment: occasionally  . Drug use: No    FAMILY HISTORY: Family History  Problem Relation Age of Onset  . Breast cancer Unknown   . Tongue cancer Mother   . Breast cancer Mother   . Anxiety disorder Mother   . Depression Mother   . Heart disease Father   . Hypertension Father   . Hyperlipidemia Father   . Stroke Paternal Grandmother   . Colon cancer Neg Hx     ROS: Review of Systems  Constitutional: Negative for weight loss.  Gastrointestinal: Negative for nausea and vomiting.  Musculoskeletal:       Negative muscle weakness  Psychiatric/Behavioral: Positive for depression. Negative for suicidal ideas.    PHYSICAL EXAM: Blood pressure 128/81, pulse 68, temperature 98.1 F (36.7 C), temperature source Oral, height 5\' 8"  (1.727 m), weight 206 lb (93.4 kg), SpO2 99 %. Body mass index is 31.32 kg/m. Physical Exam  Constitutional: She is oriented to person, place, and time. She appears  well-developed and well-nourished.  Cardiovascular: Normal rate.  Pulmonary/Chest: Effort normal.  Musculoskeletal: Normal range of motion.  Neurological: She is oriented to person, place, and time.  Skin: Skin is warm and dry.  Psychiatric: She has a normal mood and affect. Her behavior is normal.  Vitals reviewed.   RECENT LABS AND TESTS: BMET    Component Value Date/Time   NA 142 08/18/2017 0940   K 4.0 08/18/2017 0940   CL 101 08/18/2017 0940   CO2 25 08/18/2017 0940   GLUCOSE 89 08/18/2017  0940   GLUCOSE 79 03/26/2016 1600   BUN 12 08/18/2017 0940   CREATININE 0.70 08/18/2017 0940   CREATININE 0.61 03/26/2016 1600   CALCIUM 9.4 08/18/2017 0940   GFRNONAA 96 08/18/2017 0940   GFRNONAA >89 03/26/2016 1600   GFRAA 111 08/18/2017 0940   GFRAA >89 03/26/2016 1600   Lab Results  Component Value Date   HGBA1C 5.1 12/01/2016   HGBA1C 4.9 08/03/2016   Lab Results  Component Value Date   INSULIN 5.4 12/01/2016   INSULIN 5.9 08/03/2016   CBC    Component Value Date/Time   WBC WILL FOLLOW 08/18/2017 0940   WBC 6.7 03/26/2016 1600   RBC WILL FOLLOW 08/18/2017 0940   RBC 4.68 03/26/2016 1600   HGB WILL FOLLOW 08/18/2017 0940   HCT WILL FOLLOW 08/18/2017 0940   PLT WILL FOLLOW 08/18/2017 0940   MCV WILL FOLLOW 08/18/2017 0940   MCH WILL FOLLOW 08/18/2017 0940   MCH 30.6 03/26/2016 1600   MCHC WILL FOLLOW 08/18/2017 0940   MCHC 33.6 03/26/2016 1600   RDW WILL FOLLOW 08/18/2017 0940   LYMPHSABS WILL FOLLOW 08/18/2017 0940   MONOABS 469 03/26/2016 1600   EOSABS WILL FOLLOW 08/18/2017 0940   BASOSABS WILL FOLLOW 08/18/2017 0940   Iron/TIBC/Ferritin/ %Sat    Component Value Date/Time   FERRITIN 142 12/25/2015 0912   Lipid Panel     Component Value Date/Time   CHOL 180 08/18/2017 0940   TRIG 87 08/18/2017 0940   HDL 60 08/18/2017 0940   CHOLHDL 3.0 04/05/2015 0937   VLDL 21 04/05/2015 0937   LDLCALC 103 (H) 08/18/2017 0940   Hepatic Function Panel       Component Value Date/Time   PROT 6.9 08/18/2017 0940   ALBUMIN 4.4 08/18/2017 0940   AST 16 08/18/2017 0940   ALT 22 08/18/2017 0940   ALKPHOS 47 08/18/2017 0940   BILITOT 0.9 08/18/2017 0940   BILIDIR 0.1 12/25/2015 0912   IBILI 0.5 12/25/2015 0912      Component Value Date/Time   TSH 1.470 08/03/2016 1458   TSH 2.09 03/26/2016 1600   TSH 3.10 12/25/2015 0912  Results for Kathy Clark, Kathy Clark (MRN [id number]) as of 10/05/2017 13:10  Ref. Range 08/18/2017 09:40  Vitamin D, 25-Hydroxy Latest Ref Range: 30.0 - 100.0 ng/mL 49.1    ASSESSMENT AND PLAN: Vitamin D deficiency - Plan: Vitamin D, Ergocalciferol, (DRISDOL) 50000 units CAPS capsule  Other depression - with emotional eating  At risk for osteoporosis  Class 1 obesity with serious comorbidity and body mass index (BMI) of 31.0 to 31.9 in adult, unspecified obesity type  PLAN:  Vitamin D Deficiency Taryn was informed that low vitamin D levels contributes to fatigue and are associated with obesity, breast, and colon cancer. Theo agrees to continue taking prescription Vit D @50 ,000 IU every week #4 and we will refill for 1 month. She will follow up for routine testing of vitamin D, at least 2-3 times per year. She was informed of the risk of over-replacement of vitamin D and agrees to not increase her dose unless she discusses this with Kathy Clark first. Elanda agrees to follow up with our clinic in 2 weeks.  At risk for osteopenia and osteoporosis Aaliyha is at risk for osteopenia and osteoporsis due to her vitamin D deficiency. She was encouraged to take her vitamin D and follow her higher calcium diet and increase strengthening exercise to help strengthen her bones and decrease her risk of osteopenia and osteoporosis.  Depression with  Emotional Eating Behaviors We discussed behavior modification techniques today to help Kayelynn deal with her emotional eating and depression. Melaya agrees to continue taking Wellbutrin SR 150 mg qd and she  agrees to follow up with our clinic in 2 weeks.  Obesity Waniya is currently in the action stage of change. As such, her goal is to continue with weight loss efforts She has agreed to keep a food journal with 1400 calories and 80 grams of protein daily Riot has been instructed to work up to a goal of 150 minutes of combined cardio and strengthening exercise per week for weight loss and overall health benefits. We discussed the following Behavioral Modification Strategies today: increasing lean protein intake and work on meal planning and easy cooking plans   Akiva has agreed to follow up with our clinic in 2 weeks. She was informed of the importance of frequent follow up visits to maximize her success with intensive lifestyle modifications for her multiple health conditions.   Wilhemena Durie, am acting as transcriptionist for Lacy Duverney, PA-C I, Lacy Duverney [facility], have reviewed this note and agree with its content

## 2017-10-07 ENCOUNTER — Telehealth: Payer: 59 | Admitting: Family

## 2017-10-07 DIAGNOSIS — R110 Nausea: Secondary | ICD-10-CM

## 2017-10-07 MED ORDER — ONDANSETRON 4 MG PO TBDP: 4.0000 mg | ORAL_TABLET | Freq: Three times a day (TID) | ORAL | 0 refills | Status: DC | PRN

## 2017-10-07 MED FILL — ONDANSETRON ODT 4 MG TABLET: 4 | 10 days supply | Qty: 30 | Fill #0

## 2017-10-07 NOTE — Progress Notes (Signed)
Thank you for the details you included in the comment boxes. Those details are very helpful in determining the best course of treatment for you and help [place] to provide the best care.  We are sorry that you are not feeling well.  Here is how we plan to help!  Based on what you have shared with me it looks like you have Acute Infectious Diarrhea.  Most cases of acute diarrhea are due to infections with virus and bacteria and are self-limited conditions lasting less than 14 days.  For your symptoms you may take Imodium 2 mg tablets that are over the counter at your local pharmacy. Take two tablet now and then one after each loose stool up to 6 a day.  Antibiotics are not needed for most people with diarrhea.  Optional: Zofran 4 mg 1 tablet every 8 hours as needed for nausea and vomiting   HOME CARE  We recommend changing your diet to help with your symptoms for the next few days.  Drink plenty of fluids that contain water salt and sugar. Sports drinks such as Gatorade may help.   You may try broths, soups, bananas, applesauce, soft breads, mashed potatoes or crackers.   You are considered infectious for as long as the diarrhea continues. Hand washing or use of alcohol based hand sanitizers is recommend.  It is best to stay out of work or school until your symptoms stop.   GET HELP RIGHT AWAY  If you have dark yellow colored urine or do not pass urine frequently you should drink more fluids.    If your symptoms worsen   If you feel like you are going to pass out (faint)  You have a new problem  MAKE SURE YOU   Understand these instructions.  Will watch your condition.  Will get help right away if you are not doing well or get worse.  Your e-visit answers were reviewed by a board certified advanced clinical practitioner to complete your personal care plan.  Depending on the condition, your plan could have included both over the counter or prescription medications.  If there is  a problem please reply  once you have received a response from your provider.  Your safety is important to [place].  If you have drug allergies check your prescription carefully.    You can use MyChart to ask questions about today's visit, request a non-urgent call back, or ask for a work or school excuse for 24 hours related to this e-Visit. If it has been greater than 24 hours you will need to follow up with your provider, or enter a new e-Visit to address those concerns.   You will get an e-mail in the next two days asking about your experience.  I hope that your e-visit has been valuable and will speed your recovery. Thank you for using e-visits.

## 2017-10-18 ENCOUNTER — Ambulatory Visit (INDEPENDENT_AMBULATORY_CARE_PROVIDER_SITE_OTHER): Payer: 59 | Admitting: Physician Assistant

## 2017-10-18 VITALS — BP 111/74 | HR 77 | Temp 97.5°F | Ht 68.0 in | Wt 205.0 lb

## 2017-10-18 DIAGNOSIS — Z9189 Other specified personal risk factors, not elsewhere classified: Secondary | ICD-10-CM | POA: Diagnosis not present

## 2017-10-18 DIAGNOSIS — E669 Obesity, unspecified: Secondary | ICD-10-CM

## 2017-10-18 DIAGNOSIS — Z6831 Body mass index (BMI) 31.0-31.9, adult: Secondary | ICD-10-CM | POA: Diagnosis not present

## 2017-10-18 DIAGNOSIS — F3289 Other specified depressive episodes: Secondary | ICD-10-CM | POA: Diagnosis not present

## 2017-10-18 DIAGNOSIS — E559 Vitamin D deficiency, unspecified: Secondary | ICD-10-CM

## 2017-10-18 DIAGNOSIS — E66811 Obesity, class 1: Secondary | ICD-10-CM

## 2017-10-18 MED ORDER — VITAMIN D (ERGOCALCIFEROL) 1.25 MG (50000 UNIT) PO CAPS: 50000.0000 [IU] | ORAL_CAPSULE | ORAL | 0 refills | Status: DC

## 2017-10-18 MED ORDER — BUPROPION HCL ER (SR) 150 MG PO TB12: 150.0000 mg | ORAL_TABLET | Freq: Every day | ORAL | 0 refills | Status: DC

## 2017-10-18 NOTE — Progress Notes (Signed)
Office: [phone]  /  Fax: [phone]   HPI:   Chief Complaint: OBESITY Kathy Clark is here to discuss her progress with her obesity treatment plan. She is on the keep a food journal with 1400 calories and 80 grams of protein daily and is following her eating plan approximately 90 % of the time. She states she is walking for 30 minutes 4-5 times per week. Karelly continues to do well with weight loss. Despite some personal issues, she manages to control her portions and make smarter food choices.  Her weight is 205 lb (93 kg) today and has had a weight loss of 1 pound over a period of 2 weeks since her last visit. She has lost 32 lbs since starting treatment with [place].  Vitamin D Deficiency Kathy Clark has a diagnosis of vitamin D deficiency. She is currently taking prescription Vit D and denies nausea, vomiting or muscle weakness.  At risk for osteopenia and osteoporosis Kathy Clark is at higher risk of osteopenia and osteoporosis due to vitamin D deficiency.   Depression with emotional eating behaviors Kathy Clark is struggling with emotional eating and using food for comfort to the extent that it is negatively impacting her health. She often snacks when she is not hungry. [name] sometimes feels she is out of control and then feels guilty that she made poor food choices. She has been working on behavior modification techniques to help reduce her emotional eating and has been somewhat successful. Her mood is stable and she shows no sign of suicidal or homicidal ideations.  Depression screen [facility] 2/9 11/16/2016 08/03/2016 01/21/2016 08/26/2015 07/11/2015  Decreased Interest 2 3 0 1 1  Down, Depressed, Hopeless 2 3 0 1 2  PHQ - 2 Score 4 6 0 2 3  Altered sleeping 3 3 2 2 2   Tired, decreased energy 3 3 2 2 2   Change in appetite 1 3 1  0 2  Feeling bad or failure about yourself  1 3 0 0 1  Trouble concentrating 2 2 1  0 1  Moving slowly or fidgety/restless 1 0 0 0 0  Suicidal thoughts 0 0 0 0 0  PHQ-9 Score 15 20 6 6  11   Difficult doing work/chores - - Somewhat difficult Somewhat difficult Somewhat difficult   ALLERGIES: Allergies  Allergen Reactions  . Succinylcholine Anaphylaxis  . Dilaudid [Hydromorphone Hcl] Nausea And Vomiting    MEDICATIONS: Current Outpatient Medications on File Prior to Visit  Medication Sig Dispense Refill  . AMBULATORY NON FORMULARY MEDICATION Medication Name: Please decrease CPAP setting to 10 cm of water pressure and fax [place] a download after 10 days.  Is having a lot of difficulty with air leaks is working to try to reduce her pressure to see if she is still being adequately treated but with fewer leaks. 1 vial 0  . clonazePAM (KLONOPIN) 0.25 MG disintegrating tablet Take 1 tablet (0.25 mg total) by mouth daily as needed for seizure. 20 tablet 0  . estradiol (VIVELLE-DOT) 0.075 MG/24HR   3  . gabapentin (NEURONTIN) 300 MG capsule Take 300 mg by mouth 2 (two) times daily.     . hydrochlorothiazide (MICROZIDE) 12.5 MG capsule TAKE 1 CAPSULE (12.5 MG TOTAL) BY MOUTH DAILY. 90 capsule 1  . losartan (COZAAR) 25 MG tablet Take 1 tablet (25 mg total) by mouth daily. 90 tablet 3  . Melatonin 3 MG TABS Take by mouth as directed.    . Multiple Vitamins-Minerals (MULTIVITAMIN ADULT PO) Take by mouth daily.    [name]  ondansetron (ZOFRAN ODT) 4 MG disintegrating tablet Take 1 tablet (4 mg total) by mouth every 8 (eight) hours as needed for nausea or vomiting. 30 tablet 0  . SUMAtriptan (IMITREX) 20 MG/ACT nasal spray PLACE 1 SPRAY INTO THE NOSE EVERY 2 HOURS AS NEEDED FOR. HEADACHE 6 Inhaler 2  . VIIBRYD 20 MG TABS TAKE 1 TABLET (20 MG TOTAL) BY MOUTH DAILY. 90 tablet 2  . [DISCONTINUED] rivaroxaban (XARELTO) 10 MG TABS tablet Take 1 tablet (10 mg total) by mouth daily with breakfast. 18 tablet 0   No current facility-administered medications on file prior to visit.     PAST MEDICAL HISTORY: Past Medical History:  Diagnosis Date  . Anxiety   . Back pain   . Complication of anesthesia      allergy to Succinylcholine  . Depression   . Essential hypertension, benign   . Gastritis   . GERD (gastroesophageal reflux disease)   . Hypertension   . Joint pain   . Lymphocytic colitis    microscopic  . Migraines   . Osteoarthritis   . Sciatica    right leg  . Swelling    feet, left foot    PAST SURGICAL HISTORY: Past Surgical History:  Procedure Laterality Date  . ABDOMINAL HYSTERECTOMY  07-2005  . [place] Bunionectomy Left 09/18/2016   Left Foot  . BACK SURGERY    . KNEE SURGERY  2006   left; multiple  . microdiscetomy  2005  . SHOULDER SURGERY  07-22-09   right  . SKIN CANCER EXCISION     eyelid  . TOTAL KNEE ARTHROPLASTY  07/27/2011   Procedure: TOTAL KNEE ARTHROPLASTY;  Surgeon: Gearlean Alf;  Location: [facility];  Service: Orthopedics;  Laterality: Left;    SOCIAL HISTORY: Social History   Tobacco Use  . Smoking status: Never Smoker  . Smokeless tobacco: Never Used  Substance Use Topics  . Alcohol use: Yes    Comment: occasionally  . Drug use: No    FAMILY HISTORY: Family History  Problem Relation Age of Onset  . Breast cancer Unknown   . Tongue cancer Mother   . Breast cancer Mother   . Anxiety disorder Mother   . Depression Mother   . Heart disease Father   . Hypertension Father   . Hyperlipidemia Father   . Stroke Paternal Grandmother   . Colon cancer Neg Hx     ROS: Review of Systems  Constitutional: Positive for weight loss.  Gastrointestinal: Negative for nausea and vomiting.  Musculoskeletal:       Negative muscle weakness  Psychiatric/Behavioral: Positive for depression. Negative for suicidal ideas.    PHYSICAL EXAM: Blood pressure 111/74, pulse 77, temperature (!) 97.5 F (36.4 C), temperature source Oral, height 5\' 8"  (1.727 m), weight 205 lb (93 kg). Body mass index is 31.17 kg/m. Physical Exam  Constitutional: She is oriented to person, place, and time. She appears well-developed and well-nourished.  Cardiovascular:  Normal rate.  Pulmonary/Chest: Effort normal.  Musculoskeletal: Normal range of motion.  Neurological: She is oriented to person, place, and time.  Skin: Skin is warm.  Psychiatric: She has a normal mood and affect. Her behavior is normal.  Vitals reviewed.   RECENT LABS AND TESTS: BMET    Component Value Date/Time   NA 142 08/18/2017 0940   K 4.0 08/18/2017 0940   CL 101 08/18/2017 0940   CO2 25 08/18/2017 0940   GLUCOSE 89 08/18/2017 0940   GLUCOSE 79 03/26/2016 1600  BUN 12 08/18/2017 0940   CREATININE 0.70 08/18/2017 0940   CREATININE 0.61 03/26/2016 1600   CALCIUM 9.4 08/18/2017 0940   GFRNONAA 96 08/18/2017 0940   GFRNONAA >89 03/26/2016 1600   GFRAA 111 08/18/2017 0940   GFRAA >89 03/26/2016 1600   Lab Results  Component Value Date   HGBA1C 5.1 12/01/2016   HGBA1C 4.9 08/03/2016   Lab Results  Component Value Date   INSULIN 5.4 12/01/2016   INSULIN 5.9 08/03/2016   CBC    Component Value Date/Time   WBC WILL FOLLOW 08/18/2017 0940   WBC 6.7 03/26/2016 1600   RBC WILL FOLLOW 08/18/2017 0940   RBC 4.68 03/26/2016 1600   HGB WILL FOLLOW 08/18/2017 0940   HCT WILL FOLLOW 08/18/2017 0940   PLT WILL FOLLOW 08/18/2017 0940   MCV WILL FOLLOW 08/18/2017 0940   MCH WILL FOLLOW 08/18/2017 0940   MCH 30.6 03/26/2016 1600   MCHC WILL FOLLOW 08/18/2017 0940   MCHC 33.6 03/26/2016 1600   RDW WILL FOLLOW 08/18/2017 0940   LYMPHSABS WILL FOLLOW 08/18/2017 0940   MONOABS 469 03/26/2016 1600   EOSABS WILL FOLLOW 08/18/2017 0940   BASOSABS WILL FOLLOW 08/18/2017 0940   Iron/TIBC/Ferritin/ %Sat    Component Value Date/Time   FERRITIN 142 12/25/2015 0912   Lipid Panel     Component Value Date/Time   CHOL 180 08/18/2017 0940   TRIG 87 08/18/2017 0940   HDL 60 08/18/2017 0940   CHOLHDL 3.0 04/05/2015 0937   VLDL 21 04/05/2015 0937   LDLCALC 103 (H) 08/18/2017 0940   Hepatic Function Panel     Component Value Date/Time   PROT 6.9 08/18/2017 0940   ALBUMIN  4.4 08/18/2017 0940   AST 16 08/18/2017 0940   ALT 22 08/18/2017 0940   ALKPHOS 47 08/18/2017 0940   BILITOT 0.9 08/18/2017 0940   BILIDIR 0.1 12/25/2015 0912   IBILI 0.5 12/25/2015 0912      Component Value Date/Time   TSH 1.470 08/03/2016 1458   TSH 2.09 03/26/2016 1600   TSH 3.10 12/25/2015 0912  Results for FRONIE, HOLSTEIN (MRN [id number]) as of 10/18/2017 16:45  Ref. Range 08/18/2017 09:40  Vitamin D, 25-Hydroxy Latest Ref Range: 30.0 - 100.0 ng/mL 49.1    ASSESSMENT AND PLAN: Vitamin D deficiency - Plan: Vitamin D, Ergocalciferol, (DRISDOL) 50000 units CAPS capsule  Other depression - with emotional eating - Plan: buPROPion (WELLBUTRIN SR) 150 MG 12 hr tablet  At risk for osteoporosis  Class 1 obesity with serious comorbidity and body mass index (BMI) of 31.0 to 31.9 in adult, unspecified obesity type  PLAN:  Vitamin D Deficiency Sabrine was informed that low vitamin D levels contributes to fatigue and are associated with obesity, breast, and colon cancer. Mykalah agrees to continue taking prescription Vit D @50 ,000 IU every week #4 and we will refill for 1 month. She will follow up for routine testing of vitamin D, at least 2-3 times per year. She was informed of the risk of over-replacement of vitamin D and agrees to not increase her dose unless she discusses this with [place] first. Tomie agrees to follow up with our clinic in 2 weeks.  At risk for osteopenia and osteoporosis Inocencia is at risk for osteopenia and osteoporsis due to her vitamin D deficiency. She was encouraged to take her vitamin D and follow her higher calcium diet and increase strengthening exercise to help strengthen her bones and decrease her risk of osteopenia and osteoporosis.  Depression with  Emotional Eating Behaviors We discussed behavior modification techniques today to help Corinthia deal with her emotional eating and depression. Jersi agrees to continue taking Wellbutrin SR 150 mg qd #30 and we will refill for  1 month. Darah agrees to follow up with our clinic in 2 weeks.  Obesity Alanee is currently in the action stage of change. As such, her goal is to continue with weight loss efforts She has agreed to keep a food journal with 1400 calories and 80 grams of protein daily Jeni has been instructed to work up to a goal of 150 minutes of combined cardio and strengthening exercise per week for weight loss and overall health benefits. We discussed the following Behavioral Modification Strategies today: increasing lean protein intake and keep a strict food journal   Erikka has agreed to follow up with our clinic in 2 weeks. She was informed of the importance of frequent follow up visits to maximize her success with intensive lifestyle modifications for her multiple health conditions.   Wilhemena Durie, am acting as transcriptionist for Lacy Duverney, PA-C I, Lacy Duverney [facility], have reviewed this note and agree with its content

## 2017-10-20 ENCOUNTER — Ambulatory Visit (INDEPENDENT_AMBULATORY_CARE_PROVIDER_SITE_OTHER): Payer: 59 | Admitting: Physician Assistant

## 2017-10-27 ENCOUNTER — Telehealth: Payer: 59 | Admitting: Family

## 2017-10-27 DIAGNOSIS — L710 Perioral dermatitis: Secondary | ICD-10-CM

## 2017-10-27 NOTE — Progress Notes (Signed)
E Visit for Rash  We are sorry that you are not feeling well. Here is how we plan to help!  Based on what you shared with me it looks like you have perioral dermatitis.  Perioral dermatitis is a skin rash caused by something that touches the skin and causes irritation or inflammation.  Your skin may be red, swollen, dry, cracked, and itch.  Most of the time this is caused by steroid creams or certain types of face wash.  If you get a rash, it's important to figure out what caused it so the irritant can be avoided in the future.  Stop any steroid creams, facial cleaners, or moisturizers.   HOME CARE:   Take cool showers and avoid direct sunlight.  Apply cool compress or wet dressings.  Take a bath in an oatmeal bath.  Sprinkle content of one Aveeno packet under running faucet with comfortably warm water.  Bathe for 15-20 minutes, 1-2 times daily.  Pat dry with a towel. Do not rub the rash.  Use hydrocortisone cream.  Take an antihistamine like Benadryl for widespread rashes that itch.  The adult dose of Benadryl is 25-50 mg by mouth 4 times daily.  Caution:  This type of medication may cause sleepiness.  Do not drink alcohol, drive, or operate dangerous machinery while taking antihistamines.  Do not take these medications if you have prostate enlargement.  Read package instructions thoroughly on all medications that you take.  GET HELP RIGHT AWAY IF:   Symptoms don't go away after treatment.  Severe itching that persists.  If you rash spreads or swells.  If you rash begins to smell.  If it blisters and opens or develops a yellow-brown crust.  You develop a fever.  You have a sore throat.  You become short of breath.  MAKE SURE YOU:  Understand these instructions. Will watch your condition. Will get help right away if you are not doing well or get worse.  Thank you for choosing an e-visit. Your e-visit answers were reviewed by a board certified advanced clinical  practitioner to complete your personal care plan. Depending upon the condition, your plan could have included both over the counter or prescription medications. Please review your pharmacy choice. Be sure that the pharmacy you have chosen is open so that you can pick up your prescription now.  If there is a problem you may message your provider in [place] to have the prescription routed to another pharmacy. Your safety is important to [place]. If you have drug allergies check your prescription carefully.  For the next 24 hours, you can use MyChart to ask questions about today's visit, request a non-urgent call back, or ask for a work or school excuse from your e-visit provider. You will get an email in the next two days asking about your experience. I hope that your e-visit has been valuable and will speed your recovery.

## 2017-11-02 ENCOUNTER — Encounter: Payer: Self-pay | Admitting: Family Medicine

## 2017-11-02 ENCOUNTER — Telehealth: Payer: 59 | Admitting: Family

## 2017-11-02 ENCOUNTER — Ambulatory Visit: Payer: 59 | Admitting: Family Medicine

## 2017-11-02 VITALS — BP 121/84 | HR 72 | Ht 68.0 in | Wt 209.0 lb

## 2017-11-02 DIAGNOSIS — I10 Essential (primary) hypertension: Secondary | ICD-10-CM

## 2017-11-02 DIAGNOSIS — K2900 Acute gastritis without bleeding: Secondary | ICD-10-CM | POA: Diagnosis not present

## 2017-11-02 DIAGNOSIS — G4733 Obstructive sleep apnea (adult) (pediatric): Secondary | ICD-10-CM | POA: Diagnosis not present

## 2017-11-02 DIAGNOSIS — F418 Other specified anxiety disorders: Secondary | ICD-10-CM

## 2017-11-02 DIAGNOSIS — R12 Heartburn: Secondary | ICD-10-CM

## 2017-11-02 DIAGNOSIS — M5440 Lumbago with sciatica, unspecified side: Secondary | ICD-10-CM

## 2017-11-02 DIAGNOSIS — F5102 Adjustment insomnia: Secondary | ICD-10-CM | POA: Diagnosis not present

## 2017-11-02 MED ORDER — PANTOPRAZOLE SODIUM 40 MG PO TBEC: 40.0000 mg | DELAYED_RELEASE_TABLET | Freq: Every day | ORAL | 0 refills | Status: DC

## 2017-11-02 MED ORDER — HYDROCHLOROTHIAZIDE 12.5 MG PO CAPS: 12.5000 mg | ORAL_CAPSULE | Freq: Every day | ORAL | 3 refills | Status: DC

## 2017-11-02 MED ORDER — LOSARTAN POTASSIUM 25 MG PO TABS: 25.0000 mg | ORAL_TABLET | Freq: Every day | ORAL | 3 refills | Status: DC

## 2017-11-02 MED ORDER — NAPROXEN 500 MG PO TABS: 500.0000 mg | ORAL_TABLET | Freq: Two times a day (BID) | ORAL | 0 refills | Status: DC

## 2017-11-02 MED ORDER — DIAZEPAM 5 MG PO TABS: 2.5000 mg | ORAL_TABLET | Freq: Every evening | ORAL | 0 refills | Status: DC | PRN

## 2017-11-02 MED ORDER — FAMOTIDINE 20 MG PO TABS: ORAL_TABLET | ORAL | 0 refills | Status: DC

## 2017-11-02 MED ORDER — CYCLOBENZAPRINE HCL 10 MG PO TABS: 10.0000 mg | ORAL_TABLET | Freq: Three times a day (TID) | ORAL | 0 refills | Status: DC | PRN

## 2017-11-02 MED FILL — HYDROCHLOROTHIAZIDE 12.5 MG: 12.5 | 90 days supply | Qty: 90 | Fill #0

## 2017-11-02 MED FILL — PANTOPRAZOLE SOD DR 40 MG T: 40 | 30 days supply | Qty: 30 | Fill #0

## 2017-11-02 MED FILL — FAMOTIDINE 20 MG TABLET: 20 | 15 days supply | Qty: 30 | Fill #0

## 2017-11-02 MED FILL — LOSARTAN POTASSIUM 25 MG TA: 25 | 90 days supply | Qty: 90 | Fill #0

## 2017-11-02 MED FILL — CYCLOBENZAPRINE 10 MG TAB: 10 | 10 days supply | Qty: 30 | Fill #0

## 2017-11-02 MED FILL — NAPROXEN 500 MG TABLET: 500 | 15 days supply | Qty: 30 | Fill #0

## 2017-11-02 MED FILL — diazePAM 5 MG TABS: 5 | 20 days supply | Qty: 20 | Fill #0

## 2017-11-02 NOTE — Addendum Note (Signed)
Addended by: Tereasa Coop on: 11/02/2017 05:02 PM   Modules accepted: Orders

## 2017-11-02 NOTE — Progress Notes (Signed)
We are sorry that you are not feeling well.  Is that we plan to help!  I am sorry your back is bothering you [place].  I would suggest that you always eat when you take the naproxen.  In reviewing her history it looks like you have had some significant difficulty with GERD in the past.  For the relief of severe and prolonged symptoms proton pump inhibitors work best.  However they may take somewhat longer to take effect than others.  When using a PPI, please ensure that you take it first thing in the morning and then eat 30 minutes later.  This maximizes the effectiveness of the medication.  For acute symptoms I would suggest taking famotidine 20 mg twice a day as needed.  Again it is also best eat about 20 to 30 minutes after you take the famotidine as this helps to maximize the medications effectiveness.  I will send both Protonix and famotidine to the pharmacy.  I would suggest that you try to stop these medications shortly after stopping NSAID medications.  Philis Fendt, PA-C

## 2017-11-02 NOTE — Progress Notes (Signed)

## 2017-11-02 NOTE — Progress Notes (Signed)
Subjective:    CC: BP   HPI:  Hypertension- Pt denies chest pain, SOB, dizziness, or heart palpitations.  Taking meds as directed w/o problems.  Denies medication side effects.    OSA - We had requested a drop in CPAP to 10 cm waters pressure back in October. She is doing well.  She has had very low AHI score and no significant weeks.  And it has been much more comfortable to wear.  She says she notices a big difference when she wears it versus when she does not.  F/U depression -she would like a refill on her clonazepam.  She has currently gone through a separation with her husband.  They are unlikely to get back together.  She still very engaged with therapy/counseling through [employer] at work.  She is doing well on the Viibryd though sometimes she feels like is not working as well but she really feels like it situational.  Insomnia-sometimes she gets stressed because of her separation.  Sometimes her husband will text her multiple times and starts to get really anxious at night and that she cannot fall asleep.  She has been using her clonazepam intermittently to help her fall asleep but then gets a rebound phenomenon where she wakes up after 4 hours and then cannot go back to sleep.  She is tried over-the-counter Unisom but does not like how it makes her feel.  She is also tried over-the-counter Benadryl but it causes excess grogginess.  We discussed different options.  She is tried multiple sleep aids in the past.  We will try switching her clonazepam to Valium which has a longer half-life and see if this is helpful.  If not we can always go back to clonazepam.  I definitely feel like this insomnia situational and related directly to her acute distress.  Continue with therapy and treatments over the next year and hopefully this will improve and resolve on its own.  Past medical history, Surgical history, Family history not pertinant except as noted below, Social history, Allergies, and medications have  been entered into the medical record, reviewed, and corrections made.   Review of Systems: No fevers, chills, night sweats, weight loss, chest pain, or shortness of breath.   Objective:    General: Well Developed, well nourished, and in no acute distress.  Neuro: Alert and oriented x3, extra-ocular muscles intact, sensation grossly intact.  HEENT: Normocephalic, atraumatic  Skin: Warm and dry, no rashes. Cardiac: Regular rate and rhythm, no murmurs rubs or gallops, no lower extremity edema.  Respiratory: Clear to auscultation bilaterally. Not using accessory muscles, speaking in full sentences.   Impression and Recommendations:    HTN - Well controlled. Continue current regimen. Follow up in  6 months.   OSA -doing well on CPAP.  Continue with 10 cm of water pressure.  Plan to do download when I see her back in 6 months so that we can document 1 year follow-up.  Depression -PHQ 9 score of 4 today.  Currently on Viibryd.  Insomnia-increased stress and anxiety causes intermittent insomnia.

## 2017-11-03 ENCOUNTER — Ambulatory Visit (INDEPENDENT_AMBULATORY_CARE_PROVIDER_SITE_OTHER): Payer: 59 | Admitting: Physician Assistant

## 2017-11-03 VITALS — BP 105/70 | HR 64 | Temp 97.8°F | Ht 68.0 in | Wt 206.0 lb

## 2017-11-03 DIAGNOSIS — E669 Obesity, unspecified: Secondary | ICD-10-CM | POA: Diagnosis not present

## 2017-11-03 DIAGNOSIS — Z6831 Body mass index (BMI) 31.0-31.9, adult: Secondary | ICD-10-CM

## 2017-11-03 DIAGNOSIS — F3289 Other specified depressive episodes: Secondary | ICD-10-CM

## 2017-11-03 DIAGNOSIS — E559 Vitamin D deficiency, unspecified: Secondary | ICD-10-CM

## 2017-11-03 DIAGNOSIS — Z9189 Other specified personal risk factors, not elsewhere classified: Secondary | ICD-10-CM | POA: Diagnosis not present

## 2017-11-03 MED ORDER — BUPROPION HCL ER (SR) 150 MG PO TB12: 150.0000 mg | ORAL_TABLET | Freq: Every day | ORAL | 0 refills | Status: DC

## 2017-11-03 MED ORDER — VITAMIN D (ERGOCALCIFEROL) 1.25 MG (50000 UNIT) PO CAPS: 50000.0000 [IU] | ORAL_CAPSULE | ORAL | 0 refills | Status: DC

## 2017-11-03 MED FILL — VIT D2 1.25 MG (50,000 UNIT: 1.25 MG | 28 days supply | Qty: 4 | Fill #0

## 2017-11-03 MED FILL — BUPROPION SR 150 MG TABLET: 150 | 30 days supply | Qty: 30 | Fill #0

## 2017-11-04 MED FILL — VIIBRYD 20 MG TABLET: 20 | 30 days supply | Qty: 30 | Fill #5

## 2017-11-04 NOTE — Progress Notes (Signed)
Office: [phone]  /  Fax: [phone]   HPI:   Chief Complaint: OBESITY Kathy Clark is here to discuss her progress with her obesity treatment plan. She is on the keep a food journal with 1400 calories and 80 grams of protein daily and is following her eating plan approximately 50 % of the time. She states she is walking for 30 minutes 5 times per week. Kathy Clark has had increase in snacking and deviated from her eating over Easter. She is motivated to get back on track and continue with weight loss.  Her weight is 206 lb (93.4 kg) today and has gained 1 pound since her last visit. She has lost 31 lbs since starting treatment with [place].  Vitamin D Deficiency Kathy Clark has a diagnosis of vitamin D deficiency. She is currently taking prescription Vit D and denies nausea, vomiting or muscle weakness.  At risk for osteopenia and osteoporosis Kathy Clark is at higher risk of osteopenia and osteoporosis due to vitamin D deficiency.   Depression with emotional eating behaviors Kathy Clark is struggling with emotional eating and using food for comfort to the extent that it is negatively impacting her health. She often snacks when she is not hungry. Kathy Clark sometimes feels she is out of control and then feels guilty that she made poor food choices. She has been working on behavior modification techniques to help reduce her emotional eating and has been somewhat successful. Her mood is stable and she shows no sign of suicidal or homicidal ideations.  Depression screen [facility] 2/9 11/02/2017 11/16/2016 08/03/2016 01/21/2016 08/26/2015  Decreased Interest 1 2 3  0 1  Down, Depressed, Hopeless 1 2 3  0 1  PHQ - 2 Score 2 4 6  0 2  Altered sleeping 3 3 3 2 2   Tired, decreased energy 1 3 3 2 2   Change in appetite 1 1 3 1  0  Feeling bad or failure about yourself  1 1 3  0 0  Trouble concentrating - 2 2 1  0  Moving slowly or fidgety/restless 0 1 0 0 0  Suicidal thoughts 0 0 0 0 0  PHQ-9 Score 8 15 20 6 6   Difficult doing work/chores  Somewhat difficult - - Somewhat difficult Somewhat difficult    ALLERGIES: Allergies  Allergen Reactions  . Succinylcholine Anaphylaxis  . Dilaudid [Hydromorphone Hcl] Nausea And Vomiting    MEDICATIONS: Current Outpatient Medications on File Prior to Visit  Medication Sig Dispense Refill  . AMBULATORY NON FORMULARY MEDICATION Medication Name: Please decrease CPAP setting to 10 cm of water pressure and fax [place] a download after 10 days.  Is having a lot of difficulty with air leaks is working to try to reduce her pressure to see if she is still being adequately treated but with fewer leaks. 1 vial 0  . clonazePAM (KLONOPIN) 0.25 MG disintegrating tablet Take 1 tablet (0.25 mg total) by mouth daily as needed for seizure. 20 tablet 0  . cyclobenzaprine (FLEXERIL) 10 MG tablet Take 1 tablet (10 mg total) by mouth 3 (three) times daily as needed for muscle spasms. 30 tablet 0  . diazepam (VALIUM) 5 MG tablet Take 0.5-1 tablets (2.5-5 mg total) by mouth at bedtime as needed for sedation. 20 tablet 0  . estradiol (VIVELLE-DOT) 0.075 MG/24HR   3  . famotidine (PEPCID) 20 MG tablet Take 30 minutes before lunch and 30 minutes before dinner as needed for heartburn. 30 tablet 0  . gabapentin (NEURONTIN) 300 MG capsule Take 300 mg by mouth 2 (two) times  daily.     . hydrochlorothiazide (MICROZIDE) 12.5 MG capsule Take 1 capsule (12.5 mg total) by mouth daily. 90 capsule 3  . losartan (COZAAR) 25 MG tablet Take 1 tablet (25 mg total) by mouth daily. 90 tablet 3  . Melatonin 3 MG TABS Take by mouth as directed.    . Multiple Vitamins-Minerals (MULTIVITAMIN ADULT PO) Take by mouth daily.    . naproxen (NAPROSYN) 500 MG tablet Take 1 tablet (500 mg total) by mouth 2 (two) times daily with a meal. 30 tablet 0  . ondansetron (ZOFRAN ODT) 4 MG disintegrating tablet Take 1 tablet (4 mg total) by mouth every 8 (eight) hours as needed for nausea or vomiting. 30 tablet 0  . pantoprazole (PROTONIX) 40 MG tablet Take  1 tablet (40 mg total) by mouth daily. For maximal effectiveness take 30 minutes before the first meal of the day. 30 tablet 0  . SUMAtriptan (IMITREX) 20 MG/ACT nasal spray PLACE 1 SPRAY INTO THE NOSE EVERY 2 HOURS AS NEEDED FOR. HEADACHE 6 Inhaler 2  . VIIBRYD 20 MG TABS TAKE 1 TABLET (20 MG TOTAL) BY MOUTH DAILY. 90 tablet 2   No current facility-administered medications on file prior to visit.     PAST MEDICAL HISTORY: Past Medical History:  Diagnosis Date  . Anxiety   . Back pain   . 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 Depression   . Essential hypertension, benign   . Gastritis   . GERD (gastroesophageal reflux disease)   . Hypertension   . Joint pain   . Lymphocytic colitis    microscopic  . Migraines   . Osteoarthritis   . Sciatica    right leg  . Swelling    feet, left foot    PAST SURGICAL HISTORY: Past Surgical History:  Procedure Laterality Date  . ABDOMINAL HYSTERECTOMY  07-2005  . [place] Bunionectomy Left 09/18/2016   Left Foot  . BACK SURGERY    . KNEE SURGERY  2006   left; multiple  . microdiscetomy  2005  . SHOULDER SURGERY  07-22-09   right  . SKIN CANCER EXCISION     eyelid  . TOTAL KNEE ARTHROPLASTY  07/27/2011   Procedure: TOTAL KNEE ARTHROPLASTY;  Surgeon: Gearlean Alf;  Location: [facility];  Service: Orthopedics;  Laterality: Left;    SOCIAL HISTORY: Social History   Tobacco Use  . Smoking status: Never Smoker  . Smokeless tobacco: Never Used  Substance Use Topics  . Alcohol use: Yes    Comment: occasionally  . Drug use: No    FAMILY HISTORY: Family History  Problem Relation Age of Onset  . Breast cancer Unknown   . Tongue cancer Mother   . Breast cancer Mother   . Anxiety disorder Mother   . Depression Mother   . Heart disease Father   . Hypertension Father   . Hyperlipidemia Father   . Stroke Paternal Grandmother   . Colon cancer Neg Hx     ROS: Review of Systems  Constitutional: Negative for  weight loss.  Gastrointestinal: Negative for nausea and vomiting.  Musculoskeletal:       Negative muscle weakness  Psychiatric/Behavioral: Positive for depression. Negative for suicidal ideas.    PHYSICAL EXAM: Blood pressure 105/70, pulse 64, temperature 97.8 F (36.6 C), temperature source Oral, height 5\' 8"  (1.727 m), weight 206 lb (93.4 kg), SpO2 100 %. Body mass index is 31.32 kg/m. Physical Exam  Constitutional: She is oriented  to person, place, and time. She appears well-developed and well-nourished.  Cardiovascular: Normal rate.  Pulmonary/Chest: Effort normal.  Musculoskeletal: Normal range of motion.  Neurological: She is oriented to person, place, and time.  Skin: Skin is warm and dry.  Psychiatric: She has a normal mood and affect. Her behavior is normal.  Vitals reviewed.   RECENT LABS AND TESTS: BMET    Component Value Date/Time   NA 142 08/18/2017 0940   K 4.0 08/18/2017 0940   CL 101 08/18/2017 0940   CO2 25 08/18/2017 0940   GLUCOSE 89 08/18/2017 0940   GLUCOSE 79 03/26/2016 1600   BUN 12 08/18/2017 0940   CREATININE 0.70 08/18/2017 0940   CREATININE 0.61 03/26/2016 1600   CALCIUM 9.4 08/18/2017 0940   GFRNONAA 96 08/18/2017 0940   GFRNONAA >89 03/26/2016 1600   GFRAA 111 08/18/2017 0940   GFRAA >89 03/26/2016 1600   Lab Results  Component Value Date   HGBA1C 5.1 12/01/2016   HGBA1C 4.9 08/03/2016   Lab Results  Component Value Date   INSULIN 5.4 12/01/2016   INSULIN 5.9 08/03/2016   CBC    Component Value Date/Time   WBC WILL FOLLOW 08/18/2017 0940   WBC 6.7 03/26/2016 1600   RBC WILL FOLLOW 08/18/2017 0940   RBC 4.68 03/26/2016 1600   HGB WILL FOLLOW 08/18/2017 0940   HCT WILL FOLLOW 08/18/2017 0940   PLT WILL FOLLOW 08/18/2017 0940   MCV WILL FOLLOW 08/18/2017 0940   MCH WILL FOLLOW 08/18/2017 0940   MCH 30.6 03/26/2016 1600   MCHC WILL FOLLOW 08/18/2017 0940   MCHC 33.6 03/26/2016 1600   RDW WILL FOLLOW 08/18/2017 0940    LYMPHSABS WILL FOLLOW 08/18/2017 0940   MONOABS 469 03/26/2016 1600   EOSABS WILL FOLLOW 08/18/2017 0940   BASOSABS WILL FOLLOW 08/18/2017 0940   Iron/TIBC/Ferritin/ %Sat    Component Value Date/Time   FERRITIN 142 12/25/2015 0912   Lipid Panel     Component Value Date/Time   CHOL 180 08/18/2017 0940   TRIG 87 08/18/2017 0940   HDL 60 08/18/2017 0940   CHOLHDL 3.0 04/05/2015 0937   VLDL 21 04/05/2015 0937   LDLCALC 103 (H) 08/18/2017 0940   Hepatic Function Panel     Component Value Date/Time   PROT 6.9 08/18/2017 0940   ALBUMIN 4.4 08/18/2017 0940   AST 16 08/18/2017 0940   ALT 22 08/18/2017 0940   ALKPHOS 47 08/18/2017 0940   BILITOT 0.9 08/18/2017 0940   BILIDIR 0.1 12/25/2015 0912   IBILI 0.5 12/25/2015 0912      Component Value Date/Time   TSH 1.470 08/03/2016 1458   TSH 2.09 03/26/2016 1600   TSH 3.10 12/25/2015 0912  Results for LAKYLA, BISWAS (MRN [id number]) as of 11/04/2017 09:43  Ref. Range 08/18/2017 09:40  Vitamin D, 25-Hydroxy Latest Ref Range: 30.0 - 100.0 ng/mL 49.1    ASSESSMENT AND PLAN: Vitamin D deficiency - Plan: Vitamin D, Ergocalciferol, (DRISDOL) 50000 units CAPS capsule  Other depression - With emotional eating  - Plan: buPROPion (WELLBUTRIN SR) 150 MG 12 hr tablet  At risk for osteoporosis  Class 1 obesity with serious comorbidity and body mass index (BMI) of 31.0 to 31.9 in adult, unspecified obesity type  Other depression - with emotional eating - Plan: buPROPion (WELLBUTRIN SR) 150 MG 12 hr tablet  PLAN:  Vitamin D Deficiency Kathy Clark was informed that low vitamin D levels contributes to fatigue and are associated with obesity, breast, and colon cancer. Kathy Clark  agrees to continue taking prescription Vit D @50 ,000 IU every week #4 and we will refill for 1 month. She will follow up for routine testing of vitamin D, at least 2-3 times per year. She was informed of the risk of over-replacement of vitamin D and agrees to not increase her dose  unless she discusses this with [place] first. Presly agrees to follow up with our clinic in 2 weeks.  At risk for osteopenia and osteoporosis Kathy Clark is at risk for osteopenia and osteoporsis due to her vitamin D deficiency. She was encouraged to take her vitamin D and follow her higher calcium diet and increase strengthening exercise to help strengthen her bones and decrease her risk of osteopenia and osteoporosis.  Depression with Emotional Eating Behaviors We discussed behavior modification techniques today to help Kathy Clark deal with her emotional eating and depression. Kathy Clark has agrees to continue taking Wellbutrin SR 150 mg qd #30 and we will refill for 1 month. Kathy Clark agrees to follow up with our clinic in 2 weeks.  Obesity Kathy Clark is currently in the action stage of change. As such, her goal is to continue with weight loss efforts She has agreed to keep a food journal with 1400 calories and 80 grams of protein daily Kathy Clark has been instructed to work up to a goal of 150 minutes of combined cardio and strengthening exercise per week for weight loss and overall health benefits. We discussed the following Behavioral Modification Strategies today: increasing lean protein intake and work on meal planning and easy cooking plans   Kathy Clark has agreed to follow up with our clinic in 2 weeks. She was informed of the importance of frequent follow up visits to maximize her success with intensive lifestyle modifications for her multiple health conditions.   Wilhemena Durie, am acting as transcriptionist for Lacy Duverney, PA-C I, Lacy Duverney [facility], have reviewed this note and agree with its content

## 2017-11-05 MED FILL — GABAPENTIN 300 MG CAPSULE: 300 | 90 days supply | Qty: 180 | Fill #0

## 2017-11-18 ENCOUNTER — Ambulatory Visit (INDEPENDENT_AMBULATORY_CARE_PROVIDER_SITE_OTHER): Payer: 59 | Admitting: Physician Assistant

## 2017-11-22 ENCOUNTER — Ambulatory Visit (INDEPENDENT_AMBULATORY_CARE_PROVIDER_SITE_OTHER): Payer: 59 | Admitting: Physician Assistant

## 2017-11-22 VITALS — BP 121/80 | HR 66 | Temp 97.9°F | Ht 68.0 in | Wt 204.0 lb

## 2017-11-22 DIAGNOSIS — E559 Vitamin D deficiency, unspecified: Secondary | ICD-10-CM | POA: Diagnosis not present

## 2017-11-22 DIAGNOSIS — Z96659 Presence of unspecified artificial knee joint: Secondary | ICD-10-CM | POA: Diagnosis not present

## 2017-11-22 DIAGNOSIS — Z6831 Body mass index (BMI) 31.0-31.9, adult: Secondary | ICD-10-CM

## 2017-11-22 DIAGNOSIS — E669 Obesity, unspecified: Secondary | ICD-10-CM | POA: Diagnosis not present

## 2017-11-22 DIAGNOSIS — Z9189 Other specified personal risk factors, not elsewhere classified: Secondary | ICD-10-CM | POA: Diagnosis not present

## 2017-11-22 DIAGNOSIS — I10 Essential (primary) hypertension: Secondary | ICD-10-CM | POA: Diagnosis not present

## 2017-11-22 DIAGNOSIS — G4733 Obstructive sleep apnea (adult) (pediatric): Secondary | ICD-10-CM | POA: Diagnosis not present

## 2017-11-22 MED ORDER — VITAMIN D (ERGOCALCIFEROL) 1.25 MG (50000 UNIT) PO CAPS: 50000.0000 [IU] | ORAL_CAPSULE | ORAL | 0 refills | Status: DC

## 2017-11-22 NOTE — Progress Notes (Signed)
Office: [phone]  /  Fax: [phone]   HPI:   Chief Complaint: OBESITY Kathy Clark is here to discuss her progress with her obesity treatment plan. She is on the  keep a food journal with 1400 calories and 80 grams of protein daily and is following her eating plan approximately 80 % of the time. She states she is walking 30 minutes 5 times per week. [name] continues to do well with weight loss. She is very pleased with her weight loss. [name] is mindful of her eating and controls her snacking. Her weight is 204 lb (92.5 kg) today and has had a weight loss of 2 pounds over a period of 2 weeks since her last visit. She has lost 33 lbs since starting treatment with [place].  Vitamin D deficiency [name] has a diagnosis of vitamin D deficiency. She is currently taking vit D and denies nausea, vomiting or muscle weakness.  At risk for osteopenia and osteoporosis [name] is at higher risk of osteopenia and osteoporosis due to vitamin D deficiency.   Hypertension [name] is a 57 y.o. female with hypertension. [name] [place] denies chest pain or shortness of breath on exertion. She is working weight loss to help control her blood pressure with the goal of decreasing her risk of heart attack and stroke. [name] blood pressure is stable.  ALLERGIES: Allergies  Allergen Reactions  . Succinylcholine Anaphylaxis  . Dilaudid [Hydromorphone Hcl] Nausea And Vomiting    MEDICATIONS: Current Outpatient Medications on File Prior to Visit  Medication Sig Dispense Refill  . AMBULATORY NON FORMULARY MEDICATION Medication Name: Please decrease CPAP setting to 10 cm of water pressure and fax [place] a download after 10 days.  Is having a lot of difficulty with air leaks is working to try to reduce her pressure to see if she is still being adequately treated but with fewer leaks. 1 vial 0  . buPROPion (WELLBUTRIN SR) 150 MG 12 hr tablet Take 1 tablet (150 mg total) by mouth daily. 30 tablet 0  . clonazePAM  (KLONOPIN) 0.25 MG disintegrating tablet Take 1 tablet (0.25 mg total) by mouth daily as needed for seizure. 20 tablet 0  . cyclobenzaprine (FLEXERIL) 10 MG tablet Take 1 tablet (10 mg total) by mouth 3 (three) times daily as needed for muscle spasms. 30 tablet 0  . diazepam (VALIUM) 5 MG tablet Take 0.5-1 tablets (2.5-5 mg total) by mouth at bedtime as needed for sedation. 20 tablet 0  . estradiol (VIVELLE-DOT) 0.075 MG/24HR   3  . famotidine (PEPCID) 20 MG tablet Take 30 minutes before lunch and 30 minutes before dinner as needed for heartburn. 30 tablet 0  . gabapentin (NEURONTIN) 300 MG capsule Take 300 mg by mouth 2 (two) times daily.     . hydrochlorothiazide (MICROZIDE) 12.5 MG capsule Take 1 capsule (12.5 mg total) by mouth daily. 90 capsule 3  . losartan (COZAAR) 25 MG tablet Take 1 tablet (25 mg total) by mouth daily. 90 tablet 3  . Melatonin 3 MG TABS Take by mouth as directed.    . Multiple Vitamins-Minerals (MULTIVITAMIN ADULT PO) Take by mouth daily.    . naproxen (NAPROSYN) 500 MG tablet Take 1 tablet (500 mg total) by mouth 2 (two) times daily with a meal. 30 tablet 0  . ondansetron (ZOFRAN ODT) 4 MG disintegrating tablet Take 1 tablet (4 mg total) by mouth every 8 (eight) hours as needed for nausea or vomiting. 30 tablet 0  . pantoprazole (PROTONIX) 40  MG tablet Take 1 tablet (40 mg total) by mouth daily. For maximal effectiveness take 30 minutes before the first meal of the day. 30 tablet 0  . SUMAtriptan (IMITREX) 20 MG/ACT nasal spray PLACE 1 SPRAY INTO THE NOSE EVERY 2 HOURS AS NEEDED FOR. HEADACHE 6 Inhaler 2  . VIIBRYD 20 MG TABS TAKE 1 TABLET (20 MG TOTAL) BY MOUTH DAILY. 90 tablet 2   No current facility-administered medications on file prior to visit.     PAST MEDICAL HISTORY: Past Medical History:  Diagnosis Date  . Anxiety   . Back pain   . 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 Depression   . Essential hypertension, benign   . Gastritis     . GERD (gastroesophageal reflux disease)   . Hypertension   . Joint pain   . Lymphocytic colitis    microscopic  . Migraines   . Osteoarthritis   . Sciatica    right leg  . Swelling    feet, left foot    PAST SURGICAL HISTORY: Past Surgical History:  Procedure Laterality Date  . ABDOMINAL HYSTERECTOMY  07-2005  . [place] Bunionectomy Left 09/18/2016   Left Foot  . BACK SURGERY    . KNEE SURGERY  2006   left; multiple  . microdiscetomy  2005  . SHOULDER SURGERY  07-22-09   right  . SKIN CANCER EXCISION     eyelid  . TOTAL KNEE ARTHROPLASTY  07/27/2011   Procedure: TOTAL KNEE ARTHROPLASTY;  Surgeon: Gearlean Alf;  Location: [facility];  Service: Orthopedics;  Laterality: Left;    SOCIAL HISTORY: Social History   Tobacco Use  . Smoking status: Never Smoker  . Smokeless tobacco: Never Used  Substance Use Topics  . Alcohol use: Yes    Comment: occasionally  . Drug use: No    FAMILY HISTORY: Family History  Problem Relation Age of Onset  . Breast cancer Unknown   . Tongue cancer Mother   . Breast cancer Mother   . Anxiety disorder Mother   . Depression Mother   . Heart disease Father   . Hypertension Father   . Hyperlipidemia Father   . Stroke Paternal Grandmother   . Colon cancer Neg Hx     ROS: Review of Systems  Constitutional: Positive for weight loss.  Respiratory: Negative for shortness of breath (on exertion).   Cardiovascular: Negative for chest pain.  Gastrointestinal: Negative for nausea and vomiting.  Musculoskeletal:       Negative for muscle weakness    PHYSICAL EXAM: Blood pressure 121/80, pulse 66, temperature 97.9 F (36.6 C), temperature source Oral, height 5\' 8"  (1.727 m), weight 204 lb (92.5 kg), SpO2 100 %. Body mass index is 31.02 kg/m. Physical Exam  Constitutional: She is oriented to person, place, and time. She appears well-developed and well-nourished.  Cardiovascular: Normal rate.  Pulmonary/Chest: Effort normal.   Musculoskeletal: Normal range of motion.  Neurological: She is oriented to person, place, and time.  Skin: Skin is warm and dry.  Psychiatric: She has a normal mood and affect. Her behavior is normal.  Vitals reviewed.   RECENT LABS AND TESTS: BMET    Component Value Date/Time   NA 142 08/18/2017 0940   K 4.0 08/18/2017 0940   CL 101 08/18/2017 0940   CO2 25 08/18/2017 0940   GLUCOSE 89 08/18/2017 0940   GLUCOSE 79 03/26/2016 1600   BUN 12 08/18/2017 0940   CREATININE 0.70 08/18/2017 0940  CREATININE 0.61 03/26/2016 1600   CALCIUM 9.4 08/18/2017 0940   GFRNONAA 96 08/18/2017 0940   GFRNONAA >89 03/26/2016 1600   GFRAA 111 08/18/2017 0940   GFRAA >89 03/26/2016 1600   Lab Results  Component Value Date   HGBA1C 5.1 12/01/2016   HGBA1C 4.9 08/03/2016   Lab Results  Component Value Date   INSULIN 5.4 12/01/2016   INSULIN 5.9 08/03/2016   CBC    Component Value Date/Time   WBC WILL FOLLOW 08/18/2017 0940   WBC 6.7 03/26/2016 1600   RBC WILL FOLLOW 08/18/2017 0940   RBC 4.68 03/26/2016 1600   HGB WILL FOLLOW 08/18/2017 0940   HCT WILL FOLLOW 08/18/2017 0940   PLT WILL FOLLOW 08/18/2017 0940   MCV WILL FOLLOW 08/18/2017 0940   MCH WILL FOLLOW 08/18/2017 0940   MCH 30.6 03/26/2016 1600   MCHC WILL FOLLOW 08/18/2017 0940   MCHC 33.6 03/26/2016 1600   RDW WILL FOLLOW 08/18/2017 0940   LYMPHSABS WILL FOLLOW 08/18/2017 0940   MONOABS 469 03/26/2016 1600   EOSABS WILL FOLLOW 08/18/2017 0940   BASOSABS WILL FOLLOW 08/18/2017 0940   Iron/TIBC/Ferritin/ %Sat    Component Value Date/Time   FERRITIN 142 12/25/2015 0912   Lipid Panel     Component Value Date/Time   CHOL 180 08/18/2017 0940   TRIG 87 08/18/2017 0940   HDL 60 08/18/2017 0940   CHOLHDL 3.0 04/05/2015 0937   VLDL 21 04/05/2015 0937   LDLCALC 103 (H) 08/18/2017 0940   Hepatic Function Panel     Component Value Date/Time   PROT 6.9 08/18/2017 0940   ALBUMIN 4.4 08/18/2017 0940   AST 16  08/18/2017 0940   ALT 22 08/18/2017 0940   ALKPHOS 47 08/18/2017 0940   BILITOT 0.9 08/18/2017 0940   BILIDIR 0.1 12/25/2015 0912   IBILI 0.5 12/25/2015 0912      Component Value Date/Time   TSH 1.470 08/03/2016 1458   TSH 2.09 03/26/2016 1600   TSH 3.10 12/25/2015 0912   Results for IVELISSE, CULVERHOUSE (MRN [id number]) as of 11/22/2017 16:17  Ref. Range 08/18/2017 09:40  Vitamin D, 25-Hydroxy Latest Ref Range: 30.0 - 100.0 ng/mL 49.1   ASSESSMENT AND PLAN: Vitamin D deficiency - Plan: Vitamin D, Ergocalciferol, (DRISDOL) 50000 units CAPS capsule  Essential hypertension  At risk for osteoporosis  Class 1 obesity with serious comorbidity and body mass index (BMI) of 31.0 to 31.9 in adult, unspecified obesity type  PLAN:  Vitamin D Deficiency Garima was informed that low vitamin D levels contributes to fatigue and are associated with obesity, breast, and colon cancer. She agrees to continue to take prescription Vit D @50 ,000 IU every week #4 with no refills and will follow up for routine testing of vitamin D, at least 2-3 times per year. She was informed of the risk of over-replacement of vitamin D and agrees to not increase her dose unless she discusses this with [place] first. Mashal agrees to follow up with our clinic in 2 weeks.  At risk for osteopenia and osteoporosis Laterra is at risk for osteopenia and osteoporosis due to her vitamin D deficiency. She was encouraged to take her vitamin D and follow her higher calcium diet and increase strengthening exercise to help strengthen her bones and decrease her risk of osteopenia and osteoporosis.  Hypertension We discussed sodium restriction, working on healthy weight loss, and a regular exercise program as the means to achieve improved blood pressure control. Baileigh agreed with this plan and agreed  to follow up as directed. We will continue to monitor her blood pressure as well as her progress with the above lifestyle modifications. She will  continue her medications as prescribed and will watch for signs of hypotension as she continues her lifestyle modifications.  Obesity Miyanna is currently in the action stage of change. As such, her goal is to continue with weight loss efforts She has agreed to keep a food journal with 1400 calories and 80 grams of protein daily Weslynn has been instructed to work up to a goal of 150 minutes of combined cardio and strengthening exercise per week for weight loss and overall health benefits. We discussed the following Behavioral Modification Strategies today: increasing lean protein intake and work on meal planning and easy cooking plans  Brinae has agreed to follow up with our clinic in 2 weeks. She was informed of the importance of frequent follow up visits to maximize her success with intensive lifestyle modifications for her multiple health conditions.  [name], am acting as transcriptionist for [employer], PA-C I, Lacy Duverney [facility], have reviewed this note and agree with its content

## 2017-11-23 ENCOUNTER — Telehealth: Payer: 59 | Admitting: Family

## 2017-11-23 DIAGNOSIS — J309 Allergic rhinitis, unspecified: Secondary | ICD-10-CM

## 2017-11-23 DIAGNOSIS — R05 Cough: Secondary | ICD-10-CM

## 2017-11-23 DIAGNOSIS — J202 Acute bronchitis due to streptococcus: Secondary | ICD-10-CM

## 2017-11-23 DIAGNOSIS — R059 Cough, unspecified: Secondary | ICD-10-CM

## 2017-11-23 MED ORDER — PREDNISONE 10 MG (21) PO TBPK: ORAL_TABLET | ORAL | 0 refills | Status: DC

## 2017-11-23 MED FILL — predniSONE 10 MG TABS: 10 | 6 days supply | Qty: 21 | Fill #0

## 2017-11-23 NOTE — Progress Notes (Signed)

## 2017-11-25 ENCOUNTER — Telehealth: Payer: 59 | Admitting: Family

## 2017-11-25 DIAGNOSIS — B3731 Acute candidiasis of vulva and vagina: Secondary | ICD-10-CM

## 2017-11-25 DIAGNOSIS — B9689 Other specified bacterial agents as the cause of diseases classified elsewhere: Secondary | ICD-10-CM | POA: Diagnosis not present

## 2017-11-25 DIAGNOSIS — J028 Acute pharyngitis due to other specified organisms: Secondary | ICD-10-CM | POA: Diagnosis not present

## 2017-11-25 DIAGNOSIS — B373 Candidiasis of vulva and vagina: Secondary | ICD-10-CM

## 2017-11-25 MED ORDER — BENZONATATE 100 MG PO CAPS: 100.0000 mg | ORAL_CAPSULE | Freq: Three times a day (TID) | ORAL | 0 refills | Status: DC | PRN

## 2017-11-25 MED ORDER — AZITHROMYCIN 250 MG PO TABS: ORAL_TABLET | ORAL | 0 refills | Status: DC

## 2017-11-25 MED FILL — BENZONATATE 100 MG CAPS: 100 | 5 days supply | Qty: 30 | Fill #0

## 2017-11-25 MED FILL — VIT D2 1.25 MG (50,000 UNIT: 1.25 MG | 28 days supply | Qty: 4 | Fill #0

## 2017-11-25 MED FILL — AZITHROMYCIN 250 MG TABLET: 250 | 5 days supply | Qty: 6 | Fill #0

## 2017-11-25 NOTE — Progress Notes (Signed)
Thank you for the details you included in the comment boxes. Those details are very helpful in determining the best course of treatment for you and help [place] to provide the best care. After this many days, an antibiotic is absolutely appropriate.   We are sorry that you are not feeling well.  Here is how we plan to help!  Based on your presentation I believe you most likely have A cough due to bacteria.  When patients have a fever and a productive cough with a change in color or increased sputum production, we are concerned about bacterial bronchitis.  If left untreated it can progress to pneumonia.  If your symptoms do not improve with your treatment plan it is important that you contact your provider.   I have prescribed Azithromyin 250 mg: two tablets now and then one tablet daily for 4 additonal days    In addition you may use A non-prescription cough medication called Mucinex DM: take 2 tablets every 12 hours. and A prescription cough medication called Tessalon Perles 100mg . You may take 1-2 capsules every 8 hours as needed for your cough.   From your responses in the eVisit questionnaire you describe inflammation in the upper respiratory tract which is causing a significant cough.  This is commonly called Bronchitis and has four common causes:    Allergies  Viral Infections  Acid Reflux  Bacterial Infection Allergies, viruses and acid reflux are treated by controlling symptoms or eliminating the cause. An example might be a cough caused by taking certain blood pressure medications. You stop the cough by changing the medication. Another example might be a cough caused by acid reflux. Controlling the reflux helps control the cough.  USE OF BRONCHODILATOR ("RESCUE") INHALERS: There is a risk from using your bronchodilator too frequently.  The risk is that over-reliance on a medication which only relaxes the muscles surrounding the breathing tubes can reduce the effectiveness of medications  prescribed to reduce swelling and congestion of the tubes themselves.  Although you feel brief relief from the bronchodilator inhaler, your asthma may actually be worsening with the tubes becoming more swollen and filled with mucus.  This can delay other crucial treatments, such as oral steroid medications. If you need to use a bronchodilator inhaler daily, several times per day, you should discuss this with your provider.  There are probably better treatments that could be used to keep your asthma under control.     HOME CARE . Only take medications as instructed by your medical team. . Complete the entire course of an antibiotic. . Drink plenty of fluids and get plenty of rest. . Avoid close contacts especially the very young and the elderly . Cover your mouth if you cough or cough into your sleeve. . Always remember to wash your hands . A steam or ultrasonic humidifier can help congestion.   GET HELP RIGHT AWAY IF: . You develop worsening fever. . You become short of breath . You cough up blood. . Your symptoms persist after you have completed your treatment plan MAKE SURE YOU   Understand these instructions.  Will watch your condition.  Will get help right away if you are not doing well or get worse.  Your e-visit answers were reviewed by a board certified advanced clinical practitioner to complete your personal care plan.  Depending on the condition, your plan could have included both over the counter or prescription medications. If there is a problem please reply  once you have received  a response from your provider. Your safety is important to [place].  If you have drug allergies check your prescription carefully.    You can use MyChart to ask questions about today's visit, request a non-urgent call back, or ask for a work or school excuse for 24 hours related to this e-Visit. If it has been greater than 24 hours you will need to follow up with your provider, or enter a new e-Visit to  address those concerns. You will get an e-mail in the next two days asking about your experience.  I hope that your e-visit has been valuable and will speed your recovery. Thank you for using e-visits.

## 2017-11-26 MED ORDER — FLUCONAZOLE 150 MG PO TABS: 150.0000 mg | ORAL_TABLET | Freq: Once | ORAL | 0 refills | Status: AC

## 2017-11-26 MED FILL — FLUCONAZOLE 150 MG TABS: 150 | 2 days supply | Qty: 2 | Fill #0

## 2017-11-26 NOTE — Addendum Note (Signed)
Addended by: Benjamine Mola on: 11/26/2017 09:21 AM   Modules accepted: Orders

## 2017-11-29 MED FILL — BUPROPION SR 150 MG TABLET: 150 | 30 days supply | Qty: 30 | Fill #0

## 2017-11-29 MED FILL — VIIBRYD 20 MG TABLET: 20 | 30 days supply | Qty: 30 | Fill #6

## 2017-12-01 ENCOUNTER — Other Ambulatory Visit: Payer: Self-pay | Admitting: Family Medicine

## 2017-12-02 MED FILL — clonazePAM 0.25 MG TBDP: 0.25 | 20 days supply | Qty: 20 | Fill #0

## 2017-12-02 NOTE — Telephone Encounter (Signed)
Will send to pcp for signature.Kathy Clark, [place]

## 2017-12-09 ENCOUNTER — Ambulatory Visit (INDEPENDENT_AMBULATORY_CARE_PROVIDER_SITE_OTHER): Payer: 59 | Admitting: Physician Assistant

## 2017-12-09 VITALS — BP 113/74 | HR 63 | Temp 97.8°F | Ht 68.0 in | Wt 206.0 lb

## 2017-12-09 DIAGNOSIS — E559 Vitamin D deficiency, unspecified: Secondary | ICD-10-CM

## 2017-12-09 DIAGNOSIS — Z6831 Body mass index (BMI) 31.0-31.9, adult: Secondary | ICD-10-CM

## 2017-12-09 DIAGNOSIS — E669 Obesity, unspecified: Secondary | ICD-10-CM

## 2017-12-09 NOTE — Progress Notes (Signed)
Office: [phone]  /  Fax: [phone]   HPI:   Chief Complaint: OBESITY Kathy Clark is here to discuss her progress with her obesity treatment plan. She is on the keep a food journal with 1400 calories and 80 grams of protein daily and is following her eating plan approximately 50 % of the time. She states she is walking for 30 minutes 4 times per week. Kathy Clark has not been eating the recommended protein on the meal plan and noticed increase in hunger.  Her weight is 206 lb (93.4 kg) today and has gained 2 pounds since her last visit. She has lost 31 lbs since starting treatment with [place].  Vitamin D Deficiency Kathy Clark has a diagnosis of vitamin D deficiency. She is currently taking prescription Vit D and denies nausea, vomiting or muscle weakness.  ALLERGIES: Allergies  Allergen Reactions  . Succinylcholine Anaphylaxis  . Dilaudid [Hydromorphone Hcl] Nausea And Vomiting    MEDICATIONS: Current Outpatient Medications on File Prior to Visit  Medication Sig Dispense Refill  . AMBULATORY NON FORMULARY MEDICATION Medication Name: Please decrease CPAP setting to 10 cm of water pressure and fax [place] a download after 10 days.  Is having a lot of difficulty with air leaks is working to try to reduce her pressure to see if she is still being adequately treated but with fewer leaks. 1 vial 0  . azithromycin (ZITHROMAX) 250 MG tablet Take 2 tabs now then 1 daily times 4 days 6 tablet 0  . benzonatate (TESSALON PERLES) 100 MG capsule Take 1-2 capsules (100-200 mg total) by mouth every 8 (eight) hours as needed. 30 capsule 0  . buPROPion (WELLBUTRIN SR) 150 MG 12 hr tablet Take 1 tablet (150 mg total) by mouth daily. 30 tablet 0  . clonazePAM (KLONOPIN) 0.25 MG disintegrating tablet Take 1 tablet (0.25 mg total) by mouth daily as needed. 20 tablet 0  . cyclobenzaprine (FLEXERIL) 10 MG tablet Take 1 tablet (10 mg total) by mouth 3 (three) times daily as needed for muscle spasms. 30 tablet 0  . diazepam  (VALIUM) 5 MG tablet Take 0.5-1 tablets (2.5-5 mg total) by mouth at bedtime as needed for sedation. 20 tablet 0  . estradiol (VIVELLE-DOT) 0.075 MG/24HR   3  . famotidine (PEPCID) 20 MG tablet Take 30 minutes before lunch and 30 minutes before dinner as needed for heartburn. 30 tablet 0  . gabapentin (NEURONTIN) 300 MG capsule Take 300 mg by mouth 2 (two) times daily.     . hydrochlorothiazide (MICROZIDE) 12.5 MG capsule Take 1 capsule (12.5 mg total) by mouth daily. 90 capsule 3  . losartan (COZAAR) 25 MG tablet Take 1 tablet (25 mg total) by mouth daily. 90 tablet 3  . Melatonin 3 MG TABS Take by mouth as directed.    . Multiple Vitamins-Minerals (MULTIVITAMIN ADULT PO) Take by mouth daily.    . naproxen (NAPROSYN) 500 MG tablet Take 1 tablet (500 mg total) by mouth 2 (two) times daily with a meal. 30 tablet 0  . ondansetron (ZOFRAN ODT) 4 MG disintegrating tablet Take 1 tablet (4 mg total) by mouth every 8 (eight) hours as needed for nausea or vomiting. 30 tablet 0  . pantoprazole (PROTONIX) 40 MG tablet Take 1 tablet (40 mg total) by mouth daily. For maximal effectiveness take 30 minutes before the first meal of the day. 30 tablet 0  . predniSONE (STERAPRED UNI-PAK 21 TAB) 10 MG (21) TBPK tablet As directed 21 tablet 0  . SUMAtriptan (IMITREX)  20 MG/ACT nasal spray PLACE 1 SPRAY INTO THE NOSE EVERY 2 HOURS AS NEEDED FOR. HEADACHE 6 Inhaler 2  . VIIBRYD 20 MG TABS TAKE 1 TABLET (20 MG TOTAL) BY MOUTH DAILY. 90 tablet 2  . Vitamin D, Ergocalciferol, (DRISDOL) 50000 units CAPS capsule Take 1 capsule (50,000 Units total) by mouth every 7 (seven) days. 4 capsule 0   No current facility-administered medications on file prior to visit.     PAST MEDICAL HISTORY: Past Medical History:  Diagnosis Date  . Anxiety   . Back pain   . 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 Depression   . Essential hypertension, benign   . Gastritis   . GERD (gastroesophageal reflux disease)     . Hypertension   . Joint pain   . Lymphocytic colitis    microscopic  . Migraines   . Osteoarthritis   . Sciatica    right leg  . Swelling    feet, left foot    PAST SURGICAL HISTORY: Past Surgical History:  Procedure Laterality Date  . ABDOMINAL HYSTERECTOMY  07-2005  . [place] Bunionectomy Left 09/18/2016   Left Foot  . BACK SURGERY    . KNEE SURGERY  2006   left; multiple  . microdiscetomy  2005  . SHOULDER SURGERY  07-22-09   right  . SKIN CANCER EXCISION     eyelid  . TOTAL KNEE ARTHROPLASTY  07/27/2011   Procedure: TOTAL KNEE ARTHROPLASTY;  Surgeon: Gearlean Alf;  Location: [facility];  Service: Orthopedics;  Laterality: Left;    SOCIAL HISTORY: Social History   Tobacco Use  . Smoking status: Never Smoker  . Smokeless tobacco: Never Used  Substance Use Topics  . Alcohol use: Yes    Comment: occasionally  . Drug use: No    FAMILY HISTORY: Family History  Problem Relation Age of Onset  . Breast cancer Unknown   . Tongue cancer Mother   . Breast cancer Mother   . Anxiety disorder Mother   . Depression Mother   . Heart disease Father   . Hypertension Father   . Hyperlipidemia Father   . Stroke Paternal Grandmother   . Colon cancer Neg Hx     ROS: Review of Systems  Constitutional: Negative for weight loss.  Gastrointestinal: Negative for nausea and vomiting.  Musculoskeletal:       Negative muscle weakness    PHYSICAL EXAM: Blood pressure 113/74, pulse 63, temperature 97.8 F (36.6 C), temperature source Oral, height 5\' 8"  (1.727 m), weight 206 lb (93.4 kg), SpO2 99 %. Body mass index is 31.32 kg/m. Physical Exam  Constitutional: She is oriented to person, place, and time. She appears well-developed and well-nourished.  Cardiovascular: Normal rate.  Pulmonary/Chest: Effort normal.  Musculoskeletal: Normal range of motion.  Neurological: She is oriented to person, place, and time.  Skin: Skin is warm and dry.  Psychiatric: She has a normal  mood and affect. Her behavior is normal.  Vitals reviewed.   RECENT LABS AND TESTS: BMET    Component Value Date/Time   NA 142 08/18/2017 0940   K 4.0 08/18/2017 0940   CL 101 08/18/2017 0940   CO2 25 08/18/2017 0940   GLUCOSE 89 08/18/2017 0940   GLUCOSE 79 03/26/2016 1600   BUN 12 08/18/2017 0940   CREATININE 0.70 08/18/2017 0940   CREATININE 0.61 03/26/2016 1600   CALCIUM 9.4 08/18/2017 0940   GFRNONAA 96 08/18/2017 0940   GFRNONAA >89 03/26/2016 1600  GFRAA 111 08/18/2017 0940   GFRAA >89 03/26/2016 1600   Lab Results  Component Value Date   HGBA1C 5.1 12/01/2016   HGBA1C 4.9 08/03/2016   Lab Results  Component Value Date   INSULIN 5.4 12/01/2016   INSULIN 5.9 08/03/2016   CBC    Component Value Date/Time   WBC WILL FOLLOW 08/18/2017 0940   WBC 6.7 03/26/2016 1600   RBC WILL FOLLOW 08/18/2017 0940   RBC 4.68 03/26/2016 1600   HGB WILL FOLLOW 08/18/2017 0940   HCT WILL FOLLOW 08/18/2017 0940   PLT WILL FOLLOW 08/18/2017 0940   MCV WILL FOLLOW 08/18/2017 0940   MCH WILL FOLLOW 08/18/2017 0940   MCH 30.6 03/26/2016 1600   MCHC WILL FOLLOW 08/18/2017 0940   MCHC 33.6 03/26/2016 1600   RDW WILL FOLLOW 08/18/2017 0940   LYMPHSABS WILL FOLLOW 08/18/2017 0940   MONOABS 469 03/26/2016 1600   EOSABS WILL FOLLOW 08/18/2017 0940   BASOSABS WILL FOLLOW 08/18/2017 0940   Iron/TIBC/Ferritin/ %Sat    Component Value Date/Time   FERRITIN 142 12/25/2015 0912   Lipid Panel     Component Value Date/Time   CHOL 180 08/18/2017 0940   TRIG 87 08/18/2017 0940   HDL 60 08/18/2017 0940   CHOLHDL 3.0 04/05/2015 0937   VLDL 21 04/05/2015 0937   LDLCALC 103 (H) 08/18/2017 0940   Hepatic Function Panel     Component Value Date/Time   PROT 6.9 08/18/2017 0940   ALBUMIN 4.4 08/18/2017 0940   AST 16 08/18/2017 0940   ALT 22 08/18/2017 0940   ALKPHOS 47 08/18/2017 0940   BILITOT 0.9 08/18/2017 0940   BILIDIR 0.1 12/25/2015 0912   IBILI 0.5 12/25/2015 0912        Component Value Date/Time   TSH 1.470 08/03/2016 1458   TSH 2.09 03/26/2016 1600   TSH 3.10 12/25/2015 0912  Results for LAQUIDA, COTRELL (MRN [id number]) as of 12/09/2017 16:40  Ref. Range 08/18/2017 09:40  Vitamin D, 25-Hydroxy Latest Ref Range: 30.0 - 100.0 ng/mL 49.1    ASSESSMENT AND PLAN: Vitamin D deficiency  Class 1 obesity with serious comorbidity and body mass index (BMI) of 31.0 to 31.9 in adult, unspecified obesity type  PLAN:  Vitamin D Deficiency Kathy Clark was informed that low vitamin D levels contributes to fatigue and are associated with obesity, breast, and colon cancer. Kathy Clark agrees to continue taking prescription Vit D @50 ,000 IU every week and will follow up for routine testing of vitamin D, at least 2-3 times per year. She was informed of the risk of over-replacement of vitamin D and agrees to not increase her dose unless she discusses this with [place] first. Kathy Clark agrees to follow up with our clinic in 2 weeks.  We spent > than 50% of the 15 minute visit on the counseling as documented in the note.  Obesity Kathy Clark is currently in the action stage of change. As such, her goal is to continue with weight loss efforts She has agreed to keep a food journal with 1400 calories and 85 grams of protein daily Kathy Clark has been instructed to work up to a goal of 150 minutes of combined cardio and strengthening exercise per week for weight loss and overall health benefits. We discussed the following Behavioral Modification Strategies today: increasing lean protein intake, increase H20 intake, and keep a strict food journal   Kathy Clark has agreed to follow up with our clinic in 2 weeks. She was informed of the importance of frequent follow  up visits to maximize her success with intensive lifestyle modifications for her multiple health conditions.   Wilhemena Durie, am acting as transcriptionist for Lacy Duverney, PA-C I, Lacy Duverney [facility], have reviewed this note and agree with its  content

## 2017-12-10 ENCOUNTER — Encounter: Payer: Self-pay | Admitting: Family Medicine

## 2017-12-10 ENCOUNTER — Telehealth: Payer: Self-pay | Admitting: *Deleted

## 2017-12-10 ENCOUNTER — Ambulatory Visit (INDEPENDENT_AMBULATORY_CARE_PROVIDER_SITE_OTHER): Payer: 59 | Admitting: Family Medicine

## 2017-12-10 VITALS — BP 136/77 | HR 81 | Ht 68.0 in | Wt 210.0 lb

## 2017-12-10 DIAGNOSIS — Z0000 Encounter for general adult medical examination without abnormal findings: Secondary | ICD-10-CM | POA: Diagnosis not present

## 2017-12-10 DIAGNOSIS — G4733 Obstructive sleep apnea (adult) (pediatric): Secondary | ICD-10-CM | POA: Diagnosis not present

## 2017-12-10 DIAGNOSIS — F5101 Primary insomnia: Secondary | ICD-10-CM | POA: Diagnosis not present

## 2017-12-10 MED ORDER — ZOLPIDEM TARTRATE 10 MG PO TABS: 10.0000 mg | ORAL_TABLET | Freq: Every evening | ORAL | 2 refills | Status: DC | PRN

## 2017-12-10 MED ORDER — AMBULATORY NON FORMULARY MEDICATION: 0 refills | Status: AC

## 2017-12-10 MED FILL — ZOLPIDEM TARTRATE 10 MG TAB: 10 | 30 days supply | Qty: 30 | Fill #0

## 2017-12-10 NOTE — Patient Instructions (Signed)

## 2017-12-10 NOTE — Progress Notes (Signed)
piSubjective:     Kathy Clark is a 57 y.o. female and is here for a comprehensive physical exam. The patient reports no problems.  Social History   Socioeconomic History  . Marital status: Legally Separated    Spouse name: Jenny Reichmann  . Number of children: 2  . Years of education: Not on file  . Highest education level: Not on file  Occupational History  . Occupation: Programmer, multimedia: [employer]  . Financial resource strain: Not on file  . Food insecurity:    Worry: Not on file    Inability: Not on file  . Transportation needs:    Medical: Not on file    Non-medical: Not on file  Tobacco Use  . Smoking status: Never Smoker  . Smokeless tobacco: Never Used  Substance and Sexual Activity  . Alcohol use: Yes    Comment: occasionally  . Drug use: No  . 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  Lifestyle  . Physical activity:    Days per week: Not on file    Minutes per session: Not on file  . Stress: Not on file  Relationships  . Social connections:    Talks on phone: Not on file    Gets together: Not on file    Attends religious service: Not on file    Active member of club or organization: Not on file    Attends meetings of clubs or organizations: Not on file    Relationship status: Not on file  . Intimate partner violence:    Fear of current or ex partner: Not on file    Emotionally abused: Not on file    Physically abused: Not on file    Forced sexual activity: Not on file  Other Topics Concern  . Not on file  Social History Narrative  . Not on file   Health Maintenance  Topic Date Due  . MAMMOGRAM  09/03/2017  . INFLUENZA VACCINE  02/10/2018  . TETANUS/TDAP  03/11/2025  . COLONOSCOPY  11/05/2025  . Hepatitis C Screening  Completed  . HIV Screening  Completed    The following portions of the patient's history were reviewed and updated as appropriate: allergies, current medications,  past family history, past medical history, past social history, past surgical history and problem list.  Review of Systems A comprehensive review of systems was negative.   Objective:    BP 136/77   Pulse 81   Ht 5\' 8"  (1.727 m)   Wt 210 lb (95.3 kg)   LMP  (LMP Unknown) Comment: hystrectomy  SpO2 100%   BMI 31.93 kg/m  General appearance: alert, cooperative and appears stated age Head: Normocephalic, without obvious abnormality, atraumatic Eyes: conj clear, EOMI, PEERLA Ears: normal TM's and external ear canals both ears Nose: Nares normal. Septum midline. Mucosa normal. No drainage or sinus tenderness. Throat: lips, mucosa, and tongue normal; teeth and gums normal Neck: no adenopathy, no carotid bruit, no JVD, supple, symmetrical, trachea midline and thyroid not enlarged, symmetric, no tenderness/mass/nodules Back: symmetric, no curvature. ROM normal. No CVA tenderness. Lungs: clear to auscultation bilaterally Heart: regular rate and rhythm, S1, S2 normal, no murmur, click, rub or gallop Abdomen: soft, non-tender; bowel sounds normal; no masses,  no organomegaly Extremities: extremities normal, atraumatic, no cyanosis or edema Pulses: 2+ and symmetric Skin: Skin color, texture, turgor normal. No rashes or lesions Lymph nodes: Cervical adenopathy:  nl and Axillary adenopathy: nl Neurologic: Alert and oriented X 3, normal strength and tone. Normal symmetric reflexes. Normal coordination and gait    Assessment:    Healthy female exam.      Plan:     See After Visit Summary for Counseling Recommendations   Keep up a regular exercise program and make sure you are eating a healthy diet Try to eat 4 servings of dairy a day, or if you are lactose intolerant take a calcium with vitamin D daily.  Your vaccines are up to date.   OSA-to try to decrease her pressure down to 8 cm of water pressure to see if she is still treating her apnea as well be getting less irritation around her  nose with a nasal pillows.  She says sometimes she was get like a dermatitis and has used a little topical steroid cream.  Insomnia-the diazepam actually caused her to feel more sad and more down after taking it.  She would like to try zolpidem.  Explained that typically we use 5 mg in woemen but did go ahead and write for the tens that she can split them in half.

## 2017-12-10 NOTE — Telephone Encounter (Signed)
Order faxed to [facility], confirmation received .Kathy Clark, Kathy Clark

## 2017-12-14 DIAGNOSIS — Z96659 Presence of unspecified artificial knee joint: Secondary | ICD-10-CM | POA: Diagnosis not present

## 2017-12-14 DIAGNOSIS — G4733 Obstructive sleep apnea (adult) (pediatric): Secondary | ICD-10-CM | POA: Diagnosis not present

## 2017-12-22 ENCOUNTER — Encounter: Payer: Self-pay | Admitting: Family Medicine

## 2017-12-28 ENCOUNTER — Ambulatory Visit (INDEPENDENT_AMBULATORY_CARE_PROVIDER_SITE_OTHER): Payer: 59 | Admitting: Physician Assistant

## 2017-12-28 VITALS — BP 123/80 | HR 69 | Temp 98.1°F | Ht 68.0 in | Wt 206.0 lb

## 2017-12-28 DIAGNOSIS — E669 Obesity, unspecified: Secondary | ICD-10-CM | POA: Diagnosis not present

## 2017-12-28 DIAGNOSIS — Z9189 Other specified personal risk factors, not elsewhere classified: Secondary | ICD-10-CM

## 2017-12-28 DIAGNOSIS — E559 Vitamin D deficiency, unspecified: Secondary | ICD-10-CM

## 2017-12-28 DIAGNOSIS — Z6831 Body mass index (BMI) 31.0-31.9, adult: Secondary | ICD-10-CM | POA: Diagnosis not present

## 2017-12-28 DIAGNOSIS — F3289 Other specified depressive episodes: Secondary | ICD-10-CM

## 2017-12-28 MED ORDER — VITAMIN D (ERGOCALCIFEROL) 1.25 MG (50000 UNIT) PO CAPS: 50000.0000 [IU] | ORAL_CAPSULE | ORAL | 0 refills | Status: DC

## 2017-12-28 MED ORDER — BUPROPION HCL ER (SR) 150 MG PO TB12: 150.0000 mg | ORAL_TABLET | Freq: Every day | ORAL | 0 refills | Status: DC

## 2017-12-28 MED FILL — VIT D2 1.25 MG (50,000 UNIT: 1.25 MG | 28 days supply | Qty: 4 | Fill #0

## 2017-12-28 MED FILL — BUPROPION SR 150 MG TABLET: 150 | 30 days supply | Qty: 30 | Fill #0

## 2017-12-28 NOTE — Progress Notes (Signed)
Office: [phone]  /  Fax: [phone]   HPI:   Chief Complaint: OBESITY Kathy Clark is here to discuss her progress with her obesity treatment plan. She is on the keep a food journal with 1400 calories and 85 grams of protein daily and is following her eating plan approximately 50 % of the time. She states she is packing and moving things [name] maintained her weight. She has had lots of convenience eating, as she was busy moving. She is motivated to get back on track and continue with weight loss.  Her weight is 206 lb (93.4 kg) today and has not lost weight since her last visit. She has lost 31 lbs since starting treatment with [place].  Vitamin D Deficiency Kathy Clark has a diagnosis of vitamin D deficiency. She is currently taking prescription Vit D and denies nausea, vomiting or muscle weakness.  At risk for osteopenia and osteoporosis Kathy Clark is at risk for osteopenia and osteoporsis due to her vitamin D deficiency. She was encouraged to take her vitamin D and follow her higher calcium diet and increase strengthening exercise to help strengthen her bones and decrease her risk of osteopenia and osteoporosis.  Depression with emotional eating behaviors Kathy Clark is struggling with emotional eating and using food for comfort to the extent that it is negatively impacting her health. She often snacks when she is not hungry. Kathy Clark sometimes feels she is out of control and then feels guilty that she made poor food choices. She has been working on behavior modification techniques to help reduce her emotional eating and has been somewhat successful. Her mood is stable and she shows no sign of suicidal or homicidal ideations.  Depression screen Kathy Clark 2/9 12/10/2017 11/02/2017 11/16/2016 08/03/2016 01/21/2016  Decreased Interest 1 1 2 3  0  Down, Depressed, Hopeless 1 1 2 3  0  PHQ - 2 Score 2 2 4 6  0  Altered sleeping 2 3 3 3 2   Tired, decreased energy 2 1 3 3 2   Change in appetite 1 1 1 3 1   Feeling bad or failure  about yourself  1 1 1 3  0  Trouble concentrating 1 - 2 2 1   Moving slowly or fidgety/restless 0 0 1 0 0  Suicidal thoughts 0 0 0 0 0  PHQ-9 Score 9 8 15 20 6   Difficult doing work/chores Somewhat difficult Somewhat difficult - - Somewhat difficult    ALLERGIES: Allergies  Allergen Reactions  . Succinylcholine Anaphylaxis  . Dilaudid [Hydromorphone Hcl] Nausea And Vomiting    MEDICATIONS: Current Outpatient Medications on File Prior to Visit  Medication Sig Dispense Refill  . AMBULATORY NON FORMULARY MEDICATION Medication Name: Please decrease CPAP setting to 8 cm of water pressure and fax [place] a download after 5 days.  Is having a lot of difficulty with air leaks is working to try to reduce her pressure to see if she is still being adequately treated but with fewer leaks.FAx to [place] 1 Units 0  . clonazePAM (KLONOPIN) 0.25 MG disintegrating tablet Take 1 tablet (0.25 mg total) by mouth daily as needed. 20 tablet 0  . cyclobenzaprine (FLEXERIL) 10 MG tablet Take 1 tablet (10 mg total) by mouth 3 (three) times daily as needed for muscle spasms. 30 tablet 0  . estradiol (VIVELLE-DOT) 0.075 MG/24HR   3  . famotidine (PEPCID) 20 MG tablet Take 30 minutes before lunch and 30 minutes before dinner as needed for heartburn. 30 tablet 0  . gabapentin (NEURONTIN) 300 MG capsule Take  300 mg by mouth 2 (two) times daily.     . hydrochlorothiazide (MICROZIDE) 12.5 MG capsule Take 1 capsule (12.5 mg total) by mouth daily. 90 capsule 3  . losartan (COZAAR) 25 MG tablet Take 1 tablet (25 mg total) by mouth daily. 90 tablet 3  . Melatonin 3 MG TABS Take by mouth as directed.    . Multiple Vitamins-Minerals (MULTIVITAMIN ADULT PO) Take by mouth daily.    . naproxen (NAPROSYN) 500 MG tablet Take 1 tablet (500 mg total) by mouth 2 (two) times daily with a meal. 30 tablet 0  . ondansetron (ZOFRAN ODT) 4 MG disintegrating tablet Take 1 tablet (4 mg total) by mouth every 8 (eight) hours as needed for  nausea or vomiting. 30 tablet 0  . pantoprazole (PROTONIX) 40 MG tablet Take 1 tablet (40 mg total) by mouth daily. For maximal effectiveness take 30 minutes before the first meal of the day. 30 tablet 0  . SUMAtriptan (IMITREX) 20 MG/ACT nasal spray PLACE 1 SPRAY INTO THE NOSE EVERY 2 HOURS AS NEEDED FOR. HEADACHE 6 Inhaler 2  . VIIBRYD 20 MG TABS TAKE 1 TABLET (20 MG TOTAL) BY MOUTH DAILY. 90 tablet 2  . zolpidem (AMBIEN) 10 MG tablet Take 1 tablet (10 mg total) by mouth at bedtime as needed for sleep. 30 tablet 2   No current facility-administered medications on file prior to visit.     PAST MEDICAL HISTORY: Past Medical History:  Diagnosis Date  . Anxiety   . Back pain   . 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 Depression   . Essential hypertension, benign   . Gastritis   . GERD (gastroesophageal reflux disease)   . Hypertension   . Joint pain   . Lymphocytic colitis    microscopic  . Migraines   . Osteoarthritis   . Sciatica    right leg  . Swelling    feet, left foot    PAST SURGICAL HISTORY: Past Surgical History:  Procedure Laterality Date  . ABDOMINAL HYSTERECTOMY  07-2005  . [place] Bunionectomy Left 09/18/2016   Left Foot  . BACK SURGERY    . KNEE SURGERY  2006   left; multiple  . microdiscetomy  2005  . SHOULDER SURGERY  07-22-09   right  . SKIN CANCER EXCISION     eyelid  . TOTAL KNEE ARTHROPLASTY  07/27/2011   Procedure: TOTAL KNEE ARTHROPLASTY;  Surgeon: Gearlean Alf;  Location: [facility];  Service: Orthopedics;  Laterality: Left;    SOCIAL HISTORY: Social History   Tobacco Use  . Smoking status: Never Smoker  . Smokeless tobacco: Never Used  Substance Use Topics  . Alcohol use: Yes    Comment: occasionally  . Drug use: No    FAMILY HISTORY: Family History  Problem Relation Age of Onset  . Breast cancer Unknown   . Tongue cancer Mother   . Breast cancer Mother   . Anxiety disorder Mother   . Depression Mother   .  Heart disease Father   . Hypertension Father   . Hyperlipidemia Father   . Stroke Paternal Grandmother   . Colon cancer Neg Hx     ROS: Review of Systems  Constitutional: Negative for weight loss.  Gastrointestinal: Negative for nausea and vomiting.  Musculoskeletal:       Negative muscle weakness  Psychiatric/Behavioral: Positive for depression. Negative for suicidal ideas.    PHYSICAL EXAM: Blood pressure 123/80, pulse 69, temperature 98.1 F (  36.7 C), temperature source Oral, height 5\' 8"  (1.727 m), weight 206 lb (93.4 kg), SpO2 98 %. Body mass index is 31.32 kg/m. Physical Exam  Constitutional: She is oriented to person, place, and time. She appears well-developed and well-nourished.  Cardiovascular: Normal rate.  Pulmonary/Chest: Effort normal.  Musculoskeletal: Normal range of motion.  Neurological: She is oriented to person, place, and time.  Skin: Skin is warm and dry.  Psychiatric: She has a normal mood and affect. Her behavior is normal.  Vitals reviewed.   RECENT LABS AND TESTS: BMET    Component Value Date/Time   NA 142 08/18/2017 0940   K 4.0 08/18/2017 0940   CL 101 08/18/2017 0940   CO2 25 08/18/2017 0940   GLUCOSE 89 08/18/2017 0940   GLUCOSE 79 03/26/2016 1600   BUN 12 08/18/2017 0940   CREATININE 0.70 08/18/2017 0940   CREATININE 0.61 03/26/2016 1600   CALCIUM 9.4 08/18/2017 0940   GFRNONAA 96 08/18/2017 0940   GFRNONAA >89 03/26/2016 1600   GFRAA 111 08/18/2017 0940   GFRAA >89 03/26/2016 1600   Lab Results  Component Value Date   HGBA1C 5.1 12/01/2016   HGBA1C 4.9 08/03/2016   Lab Results  Component Value Date   INSULIN 5.4 12/01/2016   INSULIN 5.9 08/03/2016   CBC    Component Value Date/Time   WBC WILL FOLLOW 08/18/2017 0940   WBC 6.7 03/26/2016 1600   RBC WILL FOLLOW 08/18/2017 0940   RBC 4.68 03/26/2016 1600   HGB WILL FOLLOW 08/18/2017 0940   HCT WILL FOLLOW 08/18/2017 0940   PLT WILL FOLLOW 08/18/2017 0940   MCV WILL  FOLLOW 08/18/2017 0940   MCH WILL FOLLOW 08/18/2017 0940   MCH 30.6 03/26/2016 1600   MCHC WILL FOLLOW 08/18/2017 0940   MCHC 33.6 03/26/2016 1600   RDW WILL FOLLOW 08/18/2017 0940   LYMPHSABS WILL FOLLOW 08/18/2017 0940   MONOABS 469 03/26/2016 1600   EOSABS WILL FOLLOW 08/18/2017 0940   BASOSABS WILL FOLLOW 08/18/2017 0940   Iron/TIBC/Ferritin/ %Sat    Component Value Date/Time   FERRITIN 142 12/25/2015 0912   Lipid Panel     Component Value Date/Time   CHOL 180 08/18/2017 0940   TRIG 87 08/18/2017 0940   HDL 60 08/18/2017 0940   CHOLHDL 3.0 04/05/2015 0937   VLDL 21 04/05/2015 0937   LDLCALC 103 (H) 08/18/2017 0940   Hepatic Function Panel     Component Value Date/Time   PROT 6.9 08/18/2017 0940   ALBUMIN 4.4 08/18/2017 0940   AST 16 08/18/2017 0940   ALT 22 08/18/2017 0940   ALKPHOS 47 08/18/2017 0940   BILITOT 0.9 08/18/2017 0940   BILIDIR 0.1 12/25/2015 0912   IBILI 0.5 12/25/2015 0912      Component Value Date/Time   TSH 1.470 08/03/2016 1458   TSH 2.09 03/26/2016 1600   TSH 3.10 12/25/2015 0912   Results for Kathy Clark, SARSFIELD (MRN [id number]) as of 12/28/2017 15:36  Ref. Range 08/18/2017 09:40  Vitamin D, 25-Hydroxy Latest Ref Range: 30.0 - 100.0 ng/mL 49.1   ASSESSMENT AND PLAN: Vitamin D deficiency - Plan: Vitamin D, Ergocalciferol, (DRISDOL) 50000 units CAPS capsule  Other depression - With emotional eating  - Plan: buPROPion (WELLBUTRIN SR) 150 MG 12 hr tablet  At risk for osteoporosis  Class 1 obesity with serious comorbidity and body mass index (BMI) of 31.0 to 31.9 in adult, unspecified obesity type  PLAN:  Vitamin D Deficiency Charisma was informed that low vitamin D  levels contributes to fatigue and are associated with obesity, breast, and colon cancer. Codee agrees to continue taking prescription Vit D @50 ,000 IU every week #4 and we will refill for 1 month. He will follow up for routine testing of vitamin D, at least 2-3 times per year. She was  informed of the risk of over-replacement of vitamin D and agrees to not increase her dose unless she discusses this with [place] first. Kadeja agrees to follow up with our clinic in 2 weeks.  At risk for osteopenia and osteoporosis Atley is at risk for osteopenia and osteoporsis due to her vitamin D deficiency. She was encouraged to take her vitamin D and follow her higher calcium diet and increase strengthening exercise to help strengthen her bones and decrease her risk of osteopenia and osteoporosis.  Depression with Emotional Eating Behaviors We discussed behavior modification techniques today to help Richardine deal with her emotional eating and depression. Chermaine agrees to continue taking Wellbutrin SR 150 mg qd #30 and we will refill for 1 month. Mike agrees to follow up with our clinic in 2 weeks.  Obesity Hailie is currently in the action stage of change. As such, her goal is to get back to weightloss efforts  She has agreed to keep a food journal with 1400 calories and 85 grams of protein daily Abrar has been instructed to work up to a goal of 150 minutes of combined cardio and strengthening exercise per week for weight loss and overall health benefits. We discussed the following Behavioral Modification Strategies today: increasing lean protein intake and keep a strict food journal   Eleonora has agreed to follow up with our clinic in 2 weeks. She was informed of the importance of frequent follow up visits to maximize her success with intensive lifestyle modifications for her multiple health conditions.   Wilhemena Durie, am acting as transcriptionist for Lacy Duverney, PA-C I, Lacy Duverney [facility], have reviewed this note and agree with its content

## 2017-12-30 ENCOUNTER — Ambulatory Visit (INDEPENDENT_AMBULATORY_CARE_PROVIDER_SITE_OTHER): Payer: 59 | Admitting: Sports Medicine

## 2017-12-30 DIAGNOSIS — G5603 Carpal tunnel syndrome, bilateral upper limbs: Secondary | ICD-10-CM

## 2017-12-30 MED ORDER — VITAMIN B-6 50 MG PO TABS: 50.0000 mg | ORAL_TABLET | Freq: Every day | ORAL | 3 refills | Status: DC

## 2017-12-30 NOTE — Progress Notes (Signed)
Subjective:    CC: Carpal tunnel syndrome  HPI: This is a pleasant 57 year old female, known carpal tunnel syndrome, previous injection bilateral was back in January 2019, now having recurrence of symptoms, pain, numbness, tingling in both hands, worse at night.  She has discussed Clark symptoms with hand surgery, he has recommended injections as needed and then proceeding to surgery if they do not provide sufficient long-term relief.  I reviewed the past medical history, family history, social history, surgical history, and allergies today and no changes were needed.  Please see the problem list section below in epic for further details.  Past Medical History: Past Medical History:  Diagnosis Date  . Anxiety   . Back pain   . 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 Depression   . Essential hypertension, benign   . Gastritis   . GERD (gastroesophageal reflux disease)   . Hypertension   . Joint pain   . Lymphocytic colitis    microscopic  . Migraines   . Osteoarthritis   . Sciatica    right leg  . Swelling    feet, left foot   Past Surgical History: Past Surgical History:  Procedure Laterality Date  . ABDOMINAL HYSTERECTOMY  07-2005  . [place] Bunionectomy Left 09/18/2016   Left Foot  . BACK SURGERY    . KNEE SURGERY  2006   left; multiple  . microdiscetomy  2005  . SHOULDER SURGERY  07-22-09   right  . SKIN CANCER EXCISION     eyelid  . TOTAL KNEE ARTHROPLASTY  07/27/2011   Procedure: TOTAL KNEE ARTHROPLASTY;  Surgeon: Gearlean Alf;  Location: [facility];  Service: Orthopedics;  Laterality: Left;   Social History: Social History   Socioeconomic History  . Marital status: Legally Separated    Spouse name: Jenny Reichmann  . Number of children: 2  . Years of education: Not on file  . Highest education level: Not on file  Occupational History  . Occupation: Programmer, multimedia: [employer]  . Financial resource strain: Not on file  . Food  insecurity:    Worry: Not on file    Inability: Not on file  . Transportation needs:    Medical: Not on file    Non-medical: Not on file  Tobacco Use  . Smoking status: Never Smoker  . Smokeless tobacco: Never Used  Substance and Sexual Activity  . Alcohol use: Yes    Comment: occasionally  . Drug use: No  . 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  Lifestyle  . Physical activity:    Days per week: Not on file    Minutes per session: Not on file  . Stress: Not on file  Relationships  . Social connections:    Talks on phone: Not on file    Gets together: Not on file    Attends religious service: Not on file    Active member of club or organization: Not on file    Attends meetings of clubs or organizations: Not on file    Relationship status: Not on file  Other Topics Concern  . Not on file  Social History Narrative  . Not on file   Family History: Family History  Problem Relation Age of Onset  . Breast cancer Unknown   . Tongue cancer Mother   . Breast cancer Mother   . Anxiety disorder Mother   .  Depression Mother   . Heart disease Father   . Hypertension Father   . Hyperlipidemia Father   . Stroke Paternal Grandmother   . Colon cancer Neg Hx    Allergies: Allergies  Allergen Reactions  . Succinylcholine Anaphylaxis  . Dilaudid [Hydromorphone Hcl] Nausea And Vomiting   Medications: See med rec.  Review of Systems: No fevers, chills, night sweats, weight loss, chest pain, or shortness of breath.   Objective:    General: Well Developed, well nourished, and in no acute distress.  Neuro: Alert and oriented x3, extra-ocular muscles intact, sensation grossly intact.  HEENT: Normocephalic, atraumatic, pupils equal round reactive to light, neck supple, no masses, no lymphadenopathy, thyroid nonpalpable.  Skin: Warm and dry, no rashes. Cardiac: Regular rate and rhythm, no murmurs rubs or gallops, no lower  extremity edema.  Respiratory: Clear to auscultation bilaterally. Not using accessory muscles, speaking in full sentences.  Procedure: Real-time Ultrasound Guided hydrodissection of the left median nerve at the carpal tunnel Device: GE Logiq E  Verbal informed consent obtained.  Time-out conducted.  Noted no overlying erythema, induration, or other signs of local infection.  Skin prepped in a sterile fashion.  Local anesthesia: Topical Ethyl chloride.  With sterile technique and under real time ultrasound guidance: Using a 25-gauge needle advanced into the carpal tunnel, taking care to avoid intraneural injection I injected medication both superficial to and deep to the median nerve freeing it from surrounding structures, I then redirected the needle deep and injected further medication around the flexor tendons deep within the carpal tunnel for a total of 1 cc kenalog 40, 5 cc lidocaine. Completed without difficulty  Advised to call if fevers/chills, erythema, induration, drainage, or persistent bleeding.  Images permanently stored and available for review in the ultrasound unit.  Impression: Technically successful ultrasound guided median nerve hydrodissection.  Procedure: Real-time Ultrasound Guided hydrodissection of the right median nerve at the carpal tunnel Device: GE Logiq E  Verbal informed consent obtained.  Time-out conducted.  Noted no overlying erythema, induration, or other signs of local infection.  Skin prepped in a sterile fashion.  Local anesthesia: Topical Ethyl chloride.  With sterile technique and under real time ultrasound guidance: Using a 25-gauge needle advanced into the carpal tunnel, taking care to avoid intraneural injection I injected medication both superficial to and deep to the median nerve freeing it from surrounding structures, I then redirected the needle deep and injected further medication around the flexor tendons deep within the carpal tunnel for a total  of 1 cc kenalog 40, 5 cc lidocaine. Completed without difficulty  Advised to call if fevers/chills, erythema, induration, drainage, or persistent bleeding.  Images permanently stored and available for review in the ultrasound unit.  Impression: Technically successful ultrasound guided median nerve hydrodissection.  Impression and Recommendations:    Bilateral carpal tunnel syndrome Previous injection was approximately 5 months ago.   Repeat bilateral median nerve hydrodissection. Consider vitamin B6 50 mg daily. Continue nighttime splinting.   Return as needed. ___________________________________________ Kathy Clark. Dianah Field, M.D., ABFM., CAQSM. Primary [facility] and [place] [facility] [place] Instructor of [place] [facility]

## 2017-12-30 NOTE — Assessment & Plan Note (Signed)
Previous injection was approximately 5 months ago.   Repeat bilateral median nerve hydrodissection. Consider vitamin B6 50 mg daily. Continue nighttime splinting.   Return as needed.

## 2018-01-02 ENCOUNTER — Encounter: Payer: Self-pay | Admitting: Family Medicine

## 2018-01-03 ENCOUNTER — Other Ambulatory Visit: Payer: Self-pay | Admitting: Family Medicine

## 2018-01-03 MED FILL — ESTRADIOL 0.075 MG PATCH: 0.075 | 84 days supply | Qty: 24 | Fill #2

## 2018-01-03 MED FILL — VIIBRYD 20 MG TABLET: 20 | 90 days supply | Qty: 90 | Fill #0

## 2018-01-05 ENCOUNTER — Telehealth: Payer: Self-pay | Admitting: Family Medicine

## 2018-01-05 ENCOUNTER — Ambulatory Visit (INDEPENDENT_AMBULATORY_CARE_PROVIDER_SITE_OTHER): Payer: 59 | Admitting: Family Medicine

## 2018-01-05 ENCOUNTER — Encounter: Payer: Self-pay | Admitting: Family Medicine

## 2018-01-05 DIAGNOSIS — G4733 Obstructive sleep apnea (adult) (pediatric): Secondary | ICD-10-CM

## 2018-01-05 DIAGNOSIS — M7742 Metatarsalgia, left foot: Secondary | ICD-10-CM | POA: Diagnosis not present

## 2018-01-05 MED ORDER — MELOXICAM 15 MG PO TABS: 15.0000 mg | ORAL_TABLET | Freq: Every day | ORAL | 0 refills | Status: DC | PRN

## 2018-01-05 MED ORDER — DICLOFENAC SODIUM 1 % TD GEL: 2.0000 g | Freq: Four times a day (QID) | TRANSDERMAL | 11 refills | Status: DC

## 2018-01-05 MED FILL — MELOXICAM 15 MG TABLET: 15 | 14 days supply | Qty: 14 | Fill #0

## 2018-01-05 MED FILL — DICLOFENAC SODIUM 1% GEL: 1 | 12 days supply | Qty: 100 | Fill #0

## 2018-01-05 NOTE — Telephone Encounter (Signed)
Pt advised of results. States she is getting more comfortable using mask and is not currently having any issues.

## 2018-01-05 NOTE — Progress Notes (Signed)
Kathy Clark is a 57 y.o. female who presents to [facility] today for left foot pain.  [name] was in her normal state of health about 10 days ago when she developed pain in her left foot.  Is a pertinent orthopedic history for left great toe bunion surgery.  She notes that about 10 days ago she experienced pain in her second and third left metatarsal heads.  She has pain is worse with ambulation and better with rest.  She notes that she has been increasing her activity a bit recently but denies any recent injury.  She denies fevers or chills nausea vomiting diarrhea.  She is tried some over-the-counter medications which helped a bit.  She is concerned because she is going to [place] later this week.  She feels pretty well otherwise.    ROS:  As above  Exam:  BP 102/71   Pulse 81   Ht 5' 7.99" (1.727 m)   Wt 207 lb (93.9 kg)   LMP  (LMP Unknown) Comment: hystrectomy  SpO2 98%   BMI 31.48 kg/m  General: Well Developed, well nourished, and in no acute distress.  Neuro/Psych: Alert and oriented x3, extra-ocular muscles intact, able to move all 4 extremities, sensation grossly intact. Skin: Warm and dry, no rashes noted.  Respiratory: Not using accessory muscles, speaking in full sentences, trachea midline.  Cardiovascular: Pulses palpable, no extremity edema. Abdomen: Does not appear distended. MSK:   Left foot well-appearing scar at dorsal first MTP slight widening of forefoot with splaying of toes 2, 3.  Normal motion.  Mild callus formation skin overlying plantar metatarsal heads 2 3 and 4 Tender to palpation second and third plantar metatarsal heads. Pulses capillary fill and sensation are intact.   Assessment and Plan: 57 y.o. female with  Left foot pain: Very likely metatarsalgia.  Differential also includes new stress fracture.  Plan for treatment with metatarsal pads as well as diclofenac gel  and oral meloxicam.  Recheck in a few weeks if not improving.  Next step would likely be x-ray and potential injection.  Additionally would consider postop shoe in the future if not doing well.  I spent 25 minutes with this patient, greater than 50% was face-to-face time counseling regarding Kathy Clark's treatment plan and how to fit metatarsal pads in shoes..   No orders of the defined types were placed in this encounter.  Meds ordered this encounter  Medications  . meloxicam (MOBIC) 15 MG tablet    Sig: Take 1 tablet (15 mg total) by mouth daily as needed for pain.    Dispense:  14 tablet    Refill:  0  . diclofenac sodium (VOLTAREN) 1 % GEL    Sig: Apply 2 g topically 4 (four) times daily. To affected joint.    Dispense:  100 g    Refill:  11    Historical information moved to improve visibility of documentation.  Past Medical History:  Diagnosis Date  . Anxiety   . Back pain   . 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 Depression   . Essential hypertension, benign   . Gastritis   . GERD (gastroesophageal reflux disease)   . Hypertension   . Joint pain   . Lymphocytic colitis    microscopic  . Migraines   . Osteoarthritis   . Sciatica    right leg  . Swelling    feet,  left foot   Past Surgical History:  Procedure Laterality Date  . ABDOMINAL HYSTERECTOMY  07-2005  . [place] Bunionectomy Left 09/18/2016   Left Foot  . BACK SURGERY    . KNEE SURGERY  2006   left; multiple  . microdiscetomy  2005  . SHOULDER SURGERY  07-22-09   right  . SKIN CANCER EXCISION     eyelid  . TOTAL KNEE ARTHROPLASTY  07/27/2011   Procedure: TOTAL KNEE ARTHROPLASTY;  Surgeon: Gearlean Alf;  Location: [facility];  Service: Orthopedics;  Laterality: Left;   Social History   Tobacco Use  . Smoking status: Never Smoker  . Smokeless tobacco: Never Used  Substance Use Topics  . Alcohol use: Yes    Comment: occasionally   family history includes Anxiety disorder in her  mother; Breast cancer in her mother and unknown relative; Depression in her mother; Heart disease in her father; Hyperlipidemia in her father; Hypertension in her father; Stroke in her paternal grandmother; Tongue cancer in her mother.  Medications: Current Outpatient Medications  Medication Sig Dispense Refill  . AMBULATORY NON FORMULARY MEDICATION Medication Name: Please decrease CPAP setting to 8 cm of water pressure and fax [place] a download after 5 days.  Is having a lot of difficulty with air leaks is working to try to reduce her pressure to see if she is still being adequately treated but with fewer leaks.FAx to [place] 1 Units 0  . buPROPion (WELLBUTRIN SR) 150 MG 12 hr tablet Take 1 tablet (150 mg total) by mouth daily. 30 tablet 0  . clonazePAM (KLONOPIN) 0.25 MG disintegrating tablet Take 1 tablet (0.25 mg total) by mouth daily as needed. 20 tablet 0  . cyclobenzaprine (FLEXERIL) 10 MG tablet Take 1 tablet (10 mg total) by mouth 3 (three) times daily as needed for muscle spasms. 30 tablet 0  . estradiol (VIVELLE-DOT) 0.075 MG/24HR   3  . famotidine (PEPCID) 20 MG tablet Take 30 minutes before lunch and 30 minutes before dinner as needed for heartburn. 30 tablet 0  . gabapentin (NEURONTIN) 300 MG capsule Take 300 mg by mouth 2 (two) times daily.     . hydrochlorothiazide (MICROZIDE) 12.5 MG capsule Take 1 capsule (12.5 mg total) by mouth daily. 90 capsule 3  . losartan (COZAAR) 25 MG tablet Take 1 tablet (25 mg total) by mouth daily. 90 tablet 3  . Melatonin 3 MG TABS Take by mouth as directed.    . Multiple Vitamins-Minerals (MULTIVITAMIN ADULT PO) Take by mouth daily.    . naproxen (NAPROSYN) 500 MG tablet Take 1 tablet (500 mg total) by mouth 2 (two) times daily with a meal. 30 tablet 0  . ondansetron (ZOFRAN ODT) 4 MG disintegrating tablet Take 1 tablet (4 mg total) by mouth every 8 (eight) hours as needed for nausea or vomiting. 30 tablet 0  . pantoprazole (PROTONIX) 40 MG  tablet Take 1 tablet (40 mg total) by mouth daily. For maximal effectiveness take 30 minutes before the first meal of the day. 30 tablet 0  . pyridOXINE (VITAMIN B-6) 50 MG tablet Take 1 tablet (50 mg total) by mouth daily. 90 tablet 3  . SUMAtriptan (IMITREX) 20 MG/ACT nasal spray PLACE 1 SPRAY INTO THE NOSE EVERY 2 HOURS AS NEEDED FOR. HEADACHE 6 Inhaler 2  . VIIBRYD 20 MG TABS TAKE 1 TABLET (20 MG TOTAL) BY MOUTH DAILY. 90 tablet 2  . Vitamin D, Ergocalciferol, (DRISDOL) 50000 units CAPS capsule Take 1 capsule (50,000  Units total) by mouth every 7 (seven) days. 4 capsule 0  . zolpidem (AMBIEN) 10 MG tablet Take 1 tablet (10 mg total) by mouth at bedtime as needed for sleep. 30 tablet 2  . diclofenac sodium (VOLTAREN) 1 % GEL Apply 2 g topically 4 (four) times daily. To affected joint. 100 g 11  . meloxicam (MOBIC) 15 MG tablet Take 1 tablet (15 mg total) by mouth daily as needed for pain. 14 tablet 0   No current facility-administered medications for this visit.    Allergies  Allergen Reactions  . Succinylcholine Anaphylaxis  . Dilaudid [Hydromorphone Hcl] Nausea And Vomiting      Discussed warning signs or symptoms. Please see discharge instructions. Patient expresses understanding.

## 2018-01-05 NOTE — Patient Instructions (Signed)
Thank you for coming in today. I think this is metatarsalgia.  Use the metatarsal pads.  If not improving let me know.  Recheck in 2-4 weeks if not better.  Consider post op shoe if not improvement.  You can get medium metatarsal pads from [place] or [place].

## 2018-01-05 NOTE — Telephone Encounter (Signed)
Left VM for Pt to return clinic call.  

## 2018-01-05 NOTE — Telephone Encounter (Signed)
Call patient: Her apnea hypopnea index (AHI) looks great on the 8 cm of water pressure.  Thus the 8 cm is working great and is quite effective.  Hopefully she is getting a little bit more comfortable fit with her mask etc. If She is having any problems please let us know.

## 2018-01-07 DIAGNOSIS — M5136 Other intervertebral disc degeneration, lumbar region: Secondary | ICD-10-CM | POA: Diagnosis not present

## 2018-01-07 DIAGNOSIS — M5416 Radiculopathy, lumbar region: Secondary | ICD-10-CM | POA: Diagnosis not present

## 2018-01-07 DIAGNOSIS — M4726 Other spondylosis with radiculopathy, lumbar region: Secondary | ICD-10-CM | POA: Diagnosis not present

## 2018-01-07 DIAGNOSIS — M5440 Lumbago with sciatica, unspecified side: Secondary | ICD-10-CM | POA: Diagnosis not present

## 2018-01-07 DIAGNOSIS — Z6831 Body mass index (BMI) 31.0-31.9, adult: Secondary | ICD-10-CM | POA: Diagnosis not present

## 2018-01-07 DIAGNOSIS — I10 Essential (primary) hypertension: Secondary | ICD-10-CM | POA: Diagnosis not present

## 2018-01-20 ENCOUNTER — Ambulatory Visit (INDEPENDENT_AMBULATORY_CARE_PROVIDER_SITE_OTHER): Payer: 59 | Admitting: Physician Assistant

## 2018-01-20 VITALS — BP 130/77 | HR 78 | Temp 98.4°F | Ht 67.0 in | Wt 207.0 lb

## 2018-01-20 DIAGNOSIS — E669 Obesity, unspecified: Secondary | ICD-10-CM | POA: Diagnosis not present

## 2018-01-20 DIAGNOSIS — I10 Essential (primary) hypertension: Secondary | ICD-10-CM

## 2018-01-20 DIAGNOSIS — Z6831 Body mass index (BMI) 31.0-31.9, adult: Secondary | ICD-10-CM

## 2018-01-20 DIAGNOSIS — F3289 Other specified depressive episodes: Secondary | ICD-10-CM

## 2018-01-20 DIAGNOSIS — Z9189 Other specified personal risk factors, not elsewhere classified: Secondary | ICD-10-CM

## 2018-01-20 MED ORDER — BUPROPION HCL ER (SR) 150 MG PO TB12: 150.0000 mg | ORAL_TABLET | Freq: Every day | ORAL | 0 refills | Status: DC

## 2018-01-20 NOTE — Progress Notes (Signed)
Office: [phone]  /  Fax: [phone]   HPI:   Chief Complaint: OBESITY Kathy Clark is here to discuss her progress with her obesity treatment plan. She is on the keep a food journal with 1400 calories and 85 grams of protein daily and is following her eating plan approximately 50 % of the time. She states she is walking and in the pool for 30-45 minutes 4 times per week. Kathy Clark has been out of town and has deviated with her eating. She is motivated to get back on track and continue with weight loss.  Her weight is 207 lb (93.9 kg) today and has gained 1 pound since her last visit. She has lost 30 lbs since starting treatment with [place].  Hypertension Kathy Clark is a 57 y.o. female with hypertension. Kathy Clark's blood pressure is stable and she denies chest pain or shortness of breath. She is working weight loss to help control her blood pressure with the goal of decreasing her risk of heart attack and stroke. Kathy Clark's blood pressure is currently controlled.  At risk for cardiovascular disease Kathy Clark is at a higher than average risk for cardiovascular disease due to obesity and hypertension. She currently denies any chest pain.  Depression with emotional eating behaviors Kathy Clark is struggling with emotional eating and using food for comfort to the extent that it is negatively impacting her health. She often snacks when she is not hungry. Kathy Clark sometimes feels she is out of control and then feels guilty that she made poor food choices. She has been working on behavior modification techniques to help reduce her emotional eating and has been somewhat successful. Her mood is stable and she shows no sign of suicidal or homicidal ideations.  Depression screen Kathy Clark 2/9 12/10/2017 11/02/2017 11/16/2016 08/03/2016 01/21/2016  Decreased Interest 1 1 2 3  0  Down, Depressed, Hopeless 1 1 2 3  0  PHQ - 2 Score 2 2 4 6  0  Altered sleeping 2 3 3 3 2   Tired, decreased energy 2 1 3 3 2   Change in appetite 1 1 1 3 1     Feeling bad or failure about yourself  1 1 1 3  0  Trouble concentrating 1 - 2 2 1   Moving slowly or fidgety/restless 0 0 1 0 0  Suicidal thoughts 0 0 0 0 0  PHQ-9 Score 9 8 15 20 6   Difficult doing work/chores Somewhat difficult Somewhat difficult - - Somewhat difficult    ALLERGIES: Allergies  Allergen Reactions  . Succinylcholine Anaphylaxis  . Dilaudid [Hydromorphone Hcl] Nausea And Vomiting    MEDICATIONS: Current Outpatient Medications on File Prior to Visit  Medication Sig Dispense Refill  . AMBULATORY NON FORMULARY MEDICATION Medication Name: Please decrease CPAP setting to 8 cm of water pressure and fax [place] a download after 5 days.  Is having a lot of difficulty with air leaks is working to try to reduce her pressure to see if she is still being adequately treated but with fewer leaks.FAx to [place] 1 Units 0  . buPROPion (WELLBUTRIN SR) 150 MG 12 hr tablet Take 1 tablet (150 mg total) by mouth daily. 30 tablet 0  . clonazePAM (KLONOPIN) 0.25 MG disintegrating tablet Take 1 tablet (0.25 mg total) by mouth daily as needed. 20 tablet 0  . cyclobenzaprine (FLEXERIL) 10 MG tablet Take 1 tablet (10 mg total) by mouth 3 (three) times daily as needed for muscle spasms. 30 tablet 0  . diclofenac sodium (VOLTAREN) 1 % GEL Apply  2 g topically 4 (four) times daily. To affected joint. 100 g 11  . estradiol (VIVELLE-DOT) 0.075 MG/24HR   3  . famotidine (PEPCID) 20 MG tablet Take 30 minutes before lunch and 30 minutes before dinner as needed for heartburn. 30 tablet 0  . gabapentin (NEURONTIN) 300 MG capsule Take 300 mg by mouth 2 (two) times daily.     . hydrochlorothiazide (MICROZIDE) 12.5 MG capsule Take 1 capsule (12.5 mg total) by mouth daily. 90 capsule 3  . losartan (COZAAR) 25 MG tablet Take 1 tablet (25 mg total) by mouth daily. 90 tablet 3  . Melatonin 3 MG TABS Take by mouth as directed.    . meloxicam (MOBIC) 15 MG tablet Take 1 tablet (15 mg total) by mouth daily as  needed for pain. 14 tablet 0  . Multiple Vitamins-Minerals (MULTIVITAMIN ADULT PO) Take by mouth daily.    . naproxen (NAPROSYN) 500 MG tablet Take 1 tablet (500 mg total) by mouth 2 (two) times daily with a meal. 30 tablet 0  . ondansetron (ZOFRAN ODT) 4 MG disintegrating tablet Take 1 tablet (4 mg total) by mouth every 8 (eight) hours as needed for nausea or vomiting. 30 tablet 0  . pantoprazole (PROTONIX) 40 MG tablet Take 1 tablet (40 mg total) by mouth daily. For maximal effectiveness take 30 minutes before the first meal of the day. 30 tablet 0  . pyridOXINE (VITAMIN B-6) 50 MG tablet Take 1 tablet (50 mg total) by mouth daily. 90 tablet 3  . SUMAtriptan (IMITREX) 20 MG/ACT nasal spray PLACE 1 SPRAY INTO THE NOSE EVERY 2 HOURS AS NEEDED FOR. HEADACHE 6 Inhaler 2  . VIIBRYD 20 MG TABS TAKE 1 TABLET (20 MG TOTAL) BY MOUTH DAILY. 90 tablet 2  . Vitamin D, Ergocalciferol, (DRISDOL) 50000 units CAPS capsule Take 1 capsule (50,000 Units total) by mouth every 7 (seven) days. 4 capsule 0  . zolpidem (AMBIEN) 10 MG tablet Take 1 tablet (10 mg total) by mouth at bedtime as needed for sleep. 30 tablet 2   No current facility-administered medications on file prior to visit.     PAST MEDICAL HISTORY: Past Medical History:  Diagnosis Date  . Anxiety   . Back pain   . 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 Depression   . Essential hypertension, benign   . Gastritis   . GERD (gastroesophageal reflux disease)   . Hypertension   . Joint pain   . Lymphocytic colitis    microscopic  . Migraines   . Osteoarthritis   . Sciatica    right leg  . Swelling    feet, left foot    PAST SURGICAL HISTORY: Past Surgical History:  Procedure Laterality Date  . ABDOMINAL HYSTERECTOMY  07-2005  . [place] Bunionectomy Left 09/18/2016   Left Foot  . BACK SURGERY    . KNEE SURGERY  2006   left; multiple  . microdiscetomy  2005  . SHOULDER SURGERY  07-22-09   right  . SKIN CANCER  EXCISION     eyelid  . TOTAL KNEE ARTHROPLASTY  07/27/2011   Procedure: TOTAL KNEE ARTHROPLASTY;  Surgeon: Gearlean Alf;  Location: [facility];  Service: Orthopedics;  Laterality: Left;    SOCIAL HISTORY: Social History   Tobacco Use  . Smoking status: Never Smoker  . Smokeless tobacco: Never Used  Substance Use Topics  . Alcohol use: Yes    Comment: occasionally  . Drug use: No  FAMILY HISTORY: Family History  Problem Relation Age of Onset  . Breast cancer Unknown   . Tongue cancer Mother   . Breast cancer Mother   . Anxiety disorder Mother   . Depression Mother   . Heart disease Father   . Hypertension Father   . Hyperlipidemia Father   . Stroke Paternal Grandmother   . Colon cancer Neg Hx     ROS: Review of Systems  Constitutional: Negative for weight loss.  Respiratory: Negative for shortness of breath.   Cardiovascular: Negative for chest pain.  Gastrointestinal: Negative for nausea and vomiting.  Musculoskeletal:       Negative muscle weakness  Psychiatric/Behavioral: Positive for depression. Negative for suicidal ideas.    PHYSICAL EXAM: Blood pressure 130/77, pulse 78, temperature 98.4 F (36.9 C), temperature source Oral, height 5\' 7"  (1.702 m), weight 207 lb (93.9 kg), SpO2 97 %. Body mass index is 32.42 kg/m. Physical Exam  Constitutional: She is oriented to person, place, and time. She appears well-developed and well-nourished.  Cardiovascular: Normal rate.  Pulmonary/Chest: Effort normal.  Musculoskeletal: Normal range of motion.  Neurological: She is oriented to person, place, and time.  Skin: Skin is warm and dry.  Psychiatric: She has a normal mood and affect. Her behavior is normal.  Vitals reviewed.   RECENT LABS AND TESTS: BMET    Component Value Date/Time   NA 142 08/18/2017 0940   K 4.0 08/18/2017 0940   CL 101 08/18/2017 0940   CO2 25 08/18/2017 0940   GLUCOSE 89 08/18/2017 0940   GLUCOSE 79 03/26/2016 1600   BUN 12  08/18/2017 0940   CREATININE 0.70 08/18/2017 0940   CREATININE 0.61 03/26/2016 1600   CALCIUM 9.4 08/18/2017 0940   GFRNONAA 96 08/18/2017 0940   GFRNONAA >89 03/26/2016 1600   GFRAA 111 08/18/2017 0940   GFRAA >89 03/26/2016 1600   Lab Results  Component Value Date   HGBA1C 5.1 12/01/2016   HGBA1C 4.9 08/03/2016   Lab Results  Component Value Date   INSULIN 5.4 12/01/2016   INSULIN 5.9 08/03/2016   CBC    Component Value Date/Time   WBC WILL FOLLOW 08/18/2017 0940   WBC 6.7 03/26/2016 1600   RBC WILL FOLLOW 08/18/2017 0940   RBC 4.68 03/26/2016 1600   HGB WILL FOLLOW 08/18/2017 0940   HCT WILL FOLLOW 08/18/2017 0940   PLT WILL FOLLOW 08/18/2017 0940   MCV WILL FOLLOW 08/18/2017 0940   MCH WILL FOLLOW 08/18/2017 0940   MCH 30.6 03/26/2016 1600   MCHC WILL FOLLOW 08/18/2017 0940   MCHC 33.6 03/26/2016 1600   RDW WILL FOLLOW 08/18/2017 0940   LYMPHSABS WILL FOLLOW 08/18/2017 0940   MONOABS 469 03/26/2016 1600   EOSABS WILL FOLLOW 08/18/2017 0940   BASOSABS WILL FOLLOW 08/18/2017 0940   Iron/TIBC/Ferritin/ %Sat    Component Value Date/Time   FERRITIN 142 12/25/2015 0912   Lipid Panel     Component Value Date/Time   CHOL 180 08/18/2017 0940   TRIG 87 08/18/2017 0940   HDL 60 08/18/2017 0940   CHOLHDL 3.0 04/05/2015 0937   VLDL 21 04/05/2015 0937   LDLCALC 103 (H) 08/18/2017 0940   Hepatic Function Panel     Component Value Date/Time   PROT 6.9 08/18/2017 0940   ALBUMIN 4.4 08/18/2017 0940   AST 16 08/18/2017 0940   ALT 22 08/18/2017 0940   ALKPHOS 47 08/18/2017 0940   BILITOT 0.9 08/18/2017 0940   BILIDIR 0.1 12/25/2015 0912  IBILI 0.5 12/25/2015 0912      Component Value Date/Time   TSH 1.470 08/03/2016 1458   TSH 2.09 03/26/2016 1600   TSH 3.10 12/25/2015 0912    ASSESSMENT AND PLAN: No diagnosis found.  PLAN:  Hypertension We discussed sodium restriction, working on healthy weight loss, and a regular exercise program as the means to  achieve improved blood pressure control. Kathy Clark agreed with this plan and agreed to follow up as directed. We will continue to monitor her blood pressure as well as her progress with the above lifestyle modifications. She will continue her medications and will watch for signs of hypotension as she continues her lifestyle modifications. Kathy Clark agrees to follow up with our clinic in 3 weeks.  Cardiovascular risk counselling Kathy Clark was given extended (15 minutes) coronary artery disease prevention counseling today. She is 57 y.o. female and has risk factors for heart disease including obesity and hypertension. We discussed intensive lifestyle modifications today with an emphasis on specific weight loss instructions and strategies. Pt was also informed of the importance of increasing exercise and decreasing saturated fats to help prevent heart disease.  Depression with Emotional Eating Behaviors We discussed behavior modification techniques today to help Kathy Clark deal with her emotional eating and depression. Kathy Clark agrees to continue taking Wellbutrin SR 150 mg qd #30 and we will refill for 1 month. Kathy Clark agrees to follow up with our clinic in 3 weeks.   Obesity Kathy Clark is currently in the action stage of change. As such, her goal is to continue with weight loss efforts She has agreed to portion control better and make smarter food choices, such as increase vegetables and decrease simple carbohydrates  Kathy Clark has been instructed to work up to a goal of 150 minutes of combined cardio and strengthening exercise per week for weight loss and overall health benefits. We discussed the following Behavioral Modification Strategies today: increasing lean protein intake and work on meal planning and easy cooking plans  [place] has agreed to follow up with our clinic in 3 weeks. She was informed of the importance of frequent follow up visits to maximize her success with intensive lifestyle modifications for her multiple health  conditions.   Kathy Clark Durie, am acting as transcriptionist for Lacy Duverney, PA-C I, Lacy Duverney [facility], have reviewed this note and agree with its content

## 2018-01-21 MED FILL — BUPROPION SR 150 MG TABLET: 150 | 30 days supply | Qty: 30 | Fill #0

## 2018-01-25 ENCOUNTER — Encounter: Payer: Self-pay | Admitting: Family Medicine

## 2018-01-26 MED ORDER — PANTOPRAZOLE SODIUM 40 MG PO TBEC: 40.0000 mg | DELAYED_RELEASE_TABLET | Freq: Every day | ORAL | 3 refills | Status: DC

## 2018-01-26 MED FILL — PANTOPRAZOLE SOD DR 40 MG T: 40 | 30 days supply | Qty: 30 | Fill #0

## 2018-02-09 ENCOUNTER — Encounter (INDEPENDENT_AMBULATORY_CARE_PROVIDER_SITE_OTHER): Payer: Self-pay

## 2018-02-09 ENCOUNTER — Ambulatory Visit (INDEPENDENT_AMBULATORY_CARE_PROVIDER_SITE_OTHER): Payer: 59 | Admitting: Family Medicine

## 2018-02-09 ENCOUNTER — Other Ambulatory Visit: Payer: Self-pay | Admitting: Family Medicine

## 2018-02-09 MED FILL — MELOXICAM 15 MG TABLET: 15 | 14 days supply | Qty: 14 | Fill #0

## 2018-02-09 MED FILL — HYDROCHLOROTHIAZIDE 12.5 MG: 12.5 | 90 days supply | Qty: 90 | Fill #1

## 2018-02-09 MED FILL — LOSARTAN POTASSIUM 25 MG TA: 25 | 90 days supply | Qty: 90 | Fill #1

## 2018-02-09 MED FILL — GABAPENTIN 300 MG CAPSULE: 300 | 90 days supply | Qty: 180 | Fill #1

## 2018-02-16 ENCOUNTER — Ambulatory Visit (INDEPENDENT_AMBULATORY_CARE_PROVIDER_SITE_OTHER): Payer: 59 | Admitting: Family Medicine

## 2018-02-23 MED FILL — AMOXICILLIN 500 MG CAPSULE: 500 | 2 days supply | Qty: 8 | Fill #0

## 2018-02-28 ENCOUNTER — Encounter: Payer: Self-pay | Admitting: Family Medicine

## 2018-03-04 ENCOUNTER — Encounter: Payer: Self-pay | Admitting: Family Medicine

## 2018-03-04 ENCOUNTER — Ambulatory Visit (INDEPENDENT_AMBULATORY_CARE_PROVIDER_SITE_OTHER): Payer: 59

## 2018-03-04 ENCOUNTER — Ambulatory Visit (INDEPENDENT_AMBULATORY_CARE_PROVIDER_SITE_OTHER): Payer: 59 | Admitting: Family Medicine

## 2018-03-04 VITALS — BP 128/77 | HR 79 | Ht 68.0 in | Wt 215.0 lb

## 2018-03-04 DIAGNOSIS — Z23 Encounter for immunization: Secondary | ICD-10-CM

## 2018-03-04 DIAGNOSIS — M79672 Pain in left foot: Secondary | ICD-10-CM

## 2018-03-04 DIAGNOSIS — M7742 Metatarsalgia, left foot: Secondary | ICD-10-CM | POA: Diagnosis not present

## 2018-03-04 DIAGNOSIS — M7989 Other specified soft tissue disorders: Secondary | ICD-10-CM | POA: Diagnosis not present

## 2018-03-04 NOTE — Progress Notes (Signed)
Kathy Clark is a 57 y.o. female who presents to [facility] today for follow up left foot pain and swelling.   Monserrate was seen 01/05/18 for left foot pain and swelling. She denied any injury prior to the foot pain. She was though to have metatarsalgia and was treated with metatarsal pads and declofenac gel. Her foot pain has not improved. She notes that it worsened a bit recently associated with pain and swelling. She thinks that she may have injured it my mis-stepping off a curb about 1 month ago.  She denies any pain, fever, chills, MVD. She has also tried meloxicam which helped a bit.     ROS:  As above  Exam:  BP 128/77   Pulse 79   Ht 5\' 8"  (1.727 m)   Wt 215 lb (97.5 kg)   LMP  (LMP Unknown) Comment: hystrectomy  BMI 32.69 kg/m  General: Well Developed, well nourished, and in no acute distress.  Neuro/Psych: Alert and oriented x3, extra-ocular muscles intact, able to move all 4 extremities, sensation grossly intact. Skin: Warm and dry, no rashes noted.  Respiratory: Not using accessory muscles, speaking in full sentences, trachea midline.  Cardiovascular: Pulses palpable, no extremity edema. Abdomen: Does not appear distended. MSK:   Left foot: Well-appearing surgical scar overlying first metatarsal and MTP from bunion surgery.  No erythema or exudate. Diffuse swelling over the dorsal midfoot. Foot and ankle motion are intact. Tender to palpation overlying the midshaft of the dorsal second and third metatarsals.  Not particularly tender at plantar second or third MTP or metatarsal heads. Pulses capillary refill and sensation are intact distally.   Patient submitted this picture via my chart prior to her visit      Lab and Radiology Results X-ray left foot images personally independently reviewed. Intact appearing surgical hardware at left first metatarsal and well-appearing postop bone changes. No acute fracture or  obvious stress fracture at the second third fourth or fifth metatarsal shafts. Old appearing avulsion fragment at lateral fifth distal end of proximal phalanx. Awaiting formal radiology review    Assessment and Plan: 57 y.o. female with  Left dorsal foot pain and swelling.  Pain today is more consistent with metatarsal stress fracture of the metatarsalgia.  I do not see any acute fractures on today's x-rays.  We discussed options.  Patient elects to have a trial of treatment for metatarsal stress fracture with postop shoe.  Alternative would be MRI.  If not improving next step would be MRI.  Recheck in 6 weeks.  Return sooner if needed.  Influenza vaccine given today prior to discharge. Orders Placed This Encounter  Procedures  . DG Foot Complete Left    Standing Status:   Future    Number of Occurrences:   1    Standing Expiration Date:   05/05/2019    Order Specific Question:   Reason for Exam (SYMPTOM  OR DIAGNOSIS REQUIRED)    Answer:   forefoot pain and swelling    Order Specific Question:   Is patient pregnant?    Answer:   No    Order Specific Question:   Preferred imaging location?    Answer:   [facility]    Order Specific Question:   Radiology Contrast Protocol - do NOT remove file path    Answer:   [email or web address]   No orders of the defined types were placed in this encounter.   Historical information moved to improve visibility  of documentation.  Past Medical History:  Diagnosis Date  . Anxiety   . Back pain   . 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 Depression   . Essential hypertension, benign   . Gastritis   . GERD (gastroesophageal reflux disease)   . Hypertension   . Joint pain   . Lymphocytic colitis    microscopic  . Migraines   . Osteoarthritis   . Sciatica    right leg  . Swelling    feet, left foot   Past Surgical History:  Procedure Laterality Date  . ABDOMINAL  HYSTERECTOMY  07-2005  . [place] Bunionectomy Left 09/18/2016   Left Foot  . BACK SURGERY    . KNEE SURGERY  2006   left; multiple  . microdiscetomy  2005  . SHOULDER SURGERY  07-22-09   right  . SKIN CANCER EXCISION     eyelid  . TOTAL KNEE ARTHROPLASTY  07/27/2011   Procedure: TOTAL KNEE ARTHROPLASTY;  Surgeon: Gearlean Alf;  Location: [facility];  Service: Orthopedics;  Laterality: Left;   Social History   Tobacco Use  . Smoking status: Never Smoker  . Smokeless tobacco: Never Used  Substance Use Topics  . Alcohol use: Yes    Comment: occasionally   family history includes Anxiety disorder in her mother; Breast cancer in her mother and unknown relative; Depression in her mother; Heart disease in her father; Hyperlipidemia in her father; Hypertension in her father; Stroke in her paternal grandmother; Tongue cancer in her mother.  Medications: Current Outpatient Medications  Medication Sig Dispense Refill  . AMBULATORY NON FORMULARY MEDICATION Medication Name: Please decrease CPAP setting to 8 cm of water pressure and fax [place] a download after 5 days.  Is having a lot of difficulty with air leaks is working to try to reduce her pressure to see if she is still being adequately treated but with fewer leaks.FAx to [place] 1 Units 0  . buPROPion (WELLBUTRIN SR) 150 MG 12 hr tablet Take 1 tablet (150 mg total) by mouth daily. 30 tablet 0  . clonazePAM (KLONOPIN) 0.25 MG disintegrating tablet Take 1 tablet (0.25 mg total) by mouth daily as needed. 20 tablet 0  . cyclobenzaprine (FLEXERIL) 10 MG tablet Take 1 tablet (10 mg total) by mouth 3 (three) times daily as needed for muscle spasms. 30 tablet 0  . diclofenac sodium (VOLTAREN) 1 % GEL Apply 2 g topically 4 (four) times daily. To affected joint. 100 g 11  . estradiol (VIVELLE-DOT) 0.075 MG/24HR   3  . famotidine (PEPCID) 20 MG tablet Take 30 minutes before lunch and 30 minutes before dinner as needed for heartburn. 30 tablet 0    . gabapentin (NEURONTIN) 300 MG capsule Take 300 mg by mouth 2 (two) times daily.     . hydrochlorothiazide (MICROZIDE) 12.5 MG capsule Take 1 capsule (12.5 mg total) by mouth daily. 90 capsule 3  . losartan (COZAAR) 25 MG tablet Take 1 tablet (25 mg total) by mouth daily. 90 tablet 3  . Melatonin 3 MG TABS Take by mouth as directed.    . meloxicam (MOBIC) 15 MG tablet TAKE 1 TABLET BY MOUTH DAILY AS NEEDED FOR PAIN. 14 tablet 0  . Multiple Vitamins-Minerals (MULTIVITAMIN ADULT PO) Take by mouth daily.    . naproxen (NAPROSYN) 500 MG tablet Take 1 tablet (500 mg total) by mouth 2 (two) times daily with a meal. 30 tablet 0  . ondansetron (ZOFRAN ODT)  4 MG disintegrating tablet Take 1 tablet (4 mg total) by mouth every 8 (eight) hours as needed for nausea or vomiting. 30 tablet 0  . pantoprazole (PROTONIX) 40 MG tablet Take 1 tablet (40 mg total) by mouth daily. 30 tablet 3  . pyridOXINE (VITAMIN B-6) 50 MG tablet Take 1 tablet (50 mg total) by mouth daily. 90 tablet 3  . SUMAtriptan (IMITREX) 20 MG/ACT nasal spray PLACE 1 SPRAY INTO THE NOSE EVERY 2 HOURS AS NEEDED FOR. HEADACHE 6 Inhaler 2  . VIIBRYD 20 MG TABS TAKE 1 TABLET (20 MG TOTAL) BY MOUTH DAILY. 90 tablet 2  . Vitamin D, Ergocalciferol, (DRISDOL) 50000 units CAPS capsule Take 1 capsule (50,000 Units total) by mouth every 7 (seven) days. 4 capsule 0  . zolpidem (AMBIEN) 10 MG tablet Take 1 tablet (10 mg total) by mouth at bedtime as needed for sleep. 30 tablet 2   No current facility-administered medications for this visit.    Allergies  Allergen Reactions  . Succinylcholine Anaphylaxis  . Dilaudid [Hydromorphone Hcl] Nausea And Vomiting      Discussed warning signs or symptoms. Please see discharge instructions. Patient expresses understanding.

## 2018-03-04 NOTE — Patient Instructions (Signed)
Thank you for coming in today. We will treat this like it is a stress fracture.  Use the post op shoe.  Recheck in 6 weeks.  Let me know sooner if not doing well.  We will order an MRI as the next step.

## 2018-03-07 ENCOUNTER — Ambulatory Visit (INDEPENDENT_AMBULATORY_CARE_PROVIDER_SITE_OTHER): Payer: 59 | Admitting: Family Medicine

## 2018-03-07 VITALS — BP 128/83 | HR 72 | Temp 97.5°F | Ht 67.0 in | Wt 213.0 lb

## 2018-03-07 DIAGNOSIS — Z9189 Other specified personal risk factors, not elsewhere classified: Secondary | ICD-10-CM | POA: Diagnosis not present

## 2018-03-07 DIAGNOSIS — Z6835 Body mass index (BMI) 35.0-35.9, adult: Secondary | ICD-10-CM | POA: Diagnosis not present

## 2018-03-07 DIAGNOSIS — F3289 Other specified depressive episodes: Secondary | ICD-10-CM

## 2018-03-07 DIAGNOSIS — E559 Vitamin D deficiency, unspecified: Secondary | ICD-10-CM

## 2018-03-07 MED ORDER — VITAMIN D (ERGOCALCIFEROL) 1.25 MG (50000 UNIT) PO CAPS: 50000.0000 [IU] | ORAL_CAPSULE | ORAL | 0 refills | Status: DC

## 2018-03-07 MED ORDER — BUPROPION HCL ER (SR) 150 MG PO TB12: 150.0000 mg | ORAL_TABLET | Freq: Every day | ORAL | 0 refills | Status: DC

## 2018-03-07 MED FILL — BUPROPION SR 150 MG TABLET: 150 | 30 days supply | Qty: 30 | Fill #0

## 2018-03-07 MED FILL — VIT D2 1.25 MG (50,000 UNIT: 1.25 MG | 28 days supply | Qty: 4 | Fill #0

## 2018-03-07 NOTE — Progress Notes (Signed)
Office: [phone]  /  Fax: [phone]   HPI:   Chief Complaint: OBESITY Kathy Clark is here to discuss her progress with her obesity treatment plan. She is on the portion control better and make smarter food choices plan and is following her eating plan approximately 50 % of the time. She states she is exercising 0 minutes 0 times per week. Kathy Clark recently had a stress fracture in the second metatarsal. She has noticed an increase in cravings. Her weight is 213 lb (96.6 kg) today and has not lost weight since her last visit. She has lost 24 lbs since starting treatment with [place].  Vitamin D deficiency Kathy Clark has a diagnosis of vitamin D deficiency. Kathy Clark is currently taking vit D and she admits fatigue, but denies nausea, vomiting or muscle weakness.  At risk for osteopenia and osteoporosis Kathy Clark is at higher risk of osteopenia and osteoporosis due to vitamin D deficiency.   Depression with emotional eating behaviors Kathy Clark is struggling with emotional eating and using food for comfort to the extent that it is negatively impacting her health. She often snacks when she is not hungry. Kathy Clark sometimes feels she is out of control and then feels guilty that she made poor food choices. She has been working on behavior modification techniques to help reduce her emotional eating and has been somewhat successful. Kathy Clark admits to cravings. Her blood pressure is controlled today and she shows no sign of suicidal or homicidal ideations.  Depression screen Kathy Clark 2/9 12/10/2017 11/02/2017 11/16/2016 08/03/2016 01/21/2016  Decreased Interest 1 1 2 3  0  Down, Depressed, Hopeless 1 1 2 3  0  PHQ - 2 Score 2 2 4 6  0  Altered sleeping 2 3 3 3 2   Tired, decreased energy 2 1 3 3 2   Change in appetite 1 1 1 3 1   Feeling bad or failure about yourself  1 1 1 3  0  Trouble concentrating 1 - 2 2 1   Moving slowly or fidgety/restless 0 0 1 0 0  Suicidal thoughts 0 0 0 0 0  PHQ-9 Score 9 8 15 20 6   Difficult doing work/chores  Somewhat difficult Somewhat difficult - - Somewhat difficult    ALLERGIES: Allergies  Allergen Reactions  . Succinylcholine Anaphylaxis  . Dilaudid [Hydromorphone Hcl] Nausea And Vomiting    MEDICATIONS: Current Outpatient Medications on File Prior to Visit  Medication Sig Dispense Refill  . AMBULATORY NON FORMULARY MEDICATION Medication Name: Please decrease CPAP setting to 8 cm of water pressure and fax [place] a download after 5 days.  Is having a lot of difficulty with air leaks is working to try to reduce her pressure to see if she is still being adequately treated but with fewer leaks.FAx to [place] 1 Units 0  . buPROPion (WELLBUTRIN SR) 150 MG 12 hr tablet Take 1 tablet (150 mg total) by mouth daily. 30 tablet 0  . clonazePAM (KLONOPIN) 0.25 MG disintegrating tablet Take 1 tablet (0.25 mg total) by mouth daily as needed. 20 tablet 0  . cyclobenzaprine (FLEXERIL) 10 MG tablet Take 1 tablet (10 mg total) by mouth 3 (three) times daily as needed for muscle spasms. 30 tablet 0  . diclofenac sodium (VOLTAREN) 1 % GEL Apply 2 g topically 4 (four) times daily. To affected joint. 100 g 11  . estradiol (VIVELLE-DOT) 0.075 MG/24HR   3  . famotidine (PEPCID) 20 MG tablet Take 30 minutes before lunch and 30 minutes before dinner as needed for heartburn. 30 tablet  0  . gabapentin (NEURONTIN) 300 MG capsule Take 300 mg by mouth 2 (two) times daily.     . hydrochlorothiazide (MICROZIDE) 12.5 MG capsule Take 1 capsule (12.5 mg total) by mouth daily. 90 capsule 3  . losartan (COZAAR) 25 MG tablet Take 1 tablet (25 mg total) by mouth daily. 90 tablet 3  . Melatonin 3 MG TABS Take by mouth as directed.    . meloxicam (MOBIC) 15 MG tablet TAKE 1 TABLET BY MOUTH DAILY AS NEEDED FOR PAIN. 14 tablet 0  . Multiple Vitamins-Minerals (MULTIVITAMIN ADULT PO) Take by mouth daily.    . naproxen (NAPROSYN) 500 MG tablet Take 1 tablet (500 mg total) by mouth 2 (two) times daily with a meal. 30 tablet 0  .  ondansetron (ZOFRAN ODT) 4 MG disintegrating tablet Take 1 tablet (4 mg total) by mouth every 8 (eight) hours as needed for nausea or vomiting. 30 tablet 0  . pantoprazole (PROTONIX) 40 MG tablet Take 1 tablet (40 mg total) by mouth daily. 30 tablet 3  . pyridOXINE (VITAMIN B-6) 50 MG tablet Take 1 tablet (50 mg total) by mouth daily. 90 tablet 3  . SUMAtriptan (IMITREX) 20 MG/ACT nasal spray PLACE 1 SPRAY INTO THE NOSE EVERY 2 HOURS AS NEEDED FOR. HEADACHE 6 Inhaler 2  . VIIBRYD 20 MG TABS TAKE 1 TABLET (20 MG TOTAL) BY MOUTH DAILY. 90 tablet 2  . Vitamin D, Ergocalciferol, (DRISDOL) 50000 units CAPS capsule Take 1 capsule (50,000 Units total) by mouth every 7 (seven) days. 4 capsule 0  . zolpidem (AMBIEN) 10 MG tablet Take 1 tablet (10 mg total) by mouth at bedtime as needed for sleep. 30 tablet 2   No current facility-administered medications on file prior to visit.     PAST MEDICAL HISTORY: Past Medical History:  Diagnosis Date  . Anxiety   . Back pain   . 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 Depression   . Essential hypertension, benign   . Gastritis   . GERD (gastroesophageal reflux disease)   . Hypertension   . Joint pain   . Lymphocytic colitis    microscopic  . Migraines   . Osteoarthritis   . Sciatica    right leg  . Swelling    feet, left foot    PAST SURGICAL HISTORY: Past Surgical History:  Procedure Laterality Date  . ABDOMINAL HYSTERECTOMY  07-2005  . [place] Bunionectomy Left 09/18/2016   Left Foot  . BACK SURGERY    . KNEE SURGERY  2006   left; multiple  . microdiscetomy  2005  . SHOULDER SURGERY  07-22-09   right  . SKIN CANCER EXCISION     eyelid  . TOTAL KNEE ARTHROPLASTY  07/27/2011   Procedure: TOTAL KNEE ARTHROPLASTY;  Surgeon: Gearlean Alf;  Location: [facility];  Service: Orthopedics;  Laterality: Left;    SOCIAL HISTORY: Social History   Tobacco Use  . Smoking status: Never Smoker  . Smokeless tobacco: Never Used    Substance Use Topics  . Alcohol use: Yes    Comment: occasionally  . Drug use: No    FAMILY HISTORY: Family History  Problem Relation Age of Onset  . Breast cancer Unknown   . Tongue cancer Mother   . Breast cancer Mother   . Anxiety disorder Mother   . Depression Mother   . Heart disease Father   . Hypertension Father   . Hyperlipidemia Father   . Stroke Paternal  Grandmother   . Colon cancer Neg Hx     ROS: Review of Systems  Constitutional: Positive for malaise/fatigue. Negative for weight loss.  Gastrointestinal: Negative for nausea and vomiting.  Musculoskeletal:       Negative for muscle weakness  Endo/Heme/Allergies:       Positive for cravings  Psychiatric/Behavioral: Positive for depression. Negative for suicidal ideas.    PHYSICAL EXAM: Blood pressure 128/83, pulse 72, temperature (!) 97.5 F (36.4 C), temperature source Oral, height 5\' 7"  (1.702 m), weight 213 lb (96.6 kg), SpO2 98 %. Body mass index is 33.36 kg/m. Physical Exam  Constitutional: She is oriented to person, place, and time. She appears well-developed and well-nourished.  Cardiovascular: Normal rate.  Pulmonary/Chest: Effort normal.  Musculoskeletal: Normal range of motion.  Neurological: She is oriented to person, place, and time.  Skin: Skin is warm and dry.  Psychiatric: She has a normal mood and affect. Her behavior is normal.  Vitals reviewed.   RECENT LABS AND TESTS: BMET    Component Value Date/Time   NA 142 08/18/2017 0940   K 4.0 08/18/2017 0940   CL 101 08/18/2017 0940   CO2 25 08/18/2017 0940   GLUCOSE 89 08/18/2017 0940   GLUCOSE 79 03/26/2016 1600   BUN 12 08/18/2017 0940   CREATININE 0.70 08/18/2017 0940   CREATININE 0.61 03/26/2016 1600   CALCIUM 9.4 08/18/2017 0940   GFRNONAA 96 08/18/2017 0940   GFRNONAA >89 03/26/2016 1600   GFRAA 111 08/18/2017 0940   GFRAA >89 03/26/2016 1600   Lab Results  Component Value Date   HGBA1C 5.1 12/01/2016   HGBA1C 4.9  08/03/2016   Lab Results  Component Value Date   INSULIN 5.4 12/01/2016   INSULIN 5.9 08/03/2016   CBC    Component Value Date/Time   WBC WILL FOLLOW 08/18/2017 0940   WBC 6.7 03/26/2016 1600   RBC WILL FOLLOW 08/18/2017 0940   RBC 4.68 03/26/2016 1600   HGB WILL FOLLOW 08/18/2017 0940   HCT WILL FOLLOW 08/18/2017 0940   PLT WILL FOLLOW 08/18/2017 0940   MCV WILL FOLLOW 08/18/2017 0940   MCH WILL FOLLOW 08/18/2017 0940   MCH 30.6 03/26/2016 1600   MCHC WILL FOLLOW 08/18/2017 0940   MCHC 33.6 03/26/2016 1600   RDW WILL FOLLOW 08/18/2017 0940   LYMPHSABS WILL FOLLOW 08/18/2017 0940   MONOABS 469 03/26/2016 1600   EOSABS WILL FOLLOW 08/18/2017 0940   BASOSABS WILL FOLLOW 08/18/2017 0940   Iron/TIBC/Ferritin/ %Sat    Component Value Date/Time   FERRITIN 142 12/25/2015 0912   Lipid Panel     Component Value Date/Time   CHOL 180 08/18/2017 0940   TRIG 87 08/18/2017 0940   HDL 60 08/18/2017 0940   CHOLHDL 3.0 04/05/2015 0937   VLDL 21 04/05/2015 0937   LDLCALC 103 (H) 08/18/2017 0940   Hepatic Function Panel     Component Value Date/Time   PROT 6.9 08/18/2017 0940   ALBUMIN 4.4 08/18/2017 0940   AST 16 08/18/2017 0940   ALT 22 08/18/2017 0940   ALKPHOS 47 08/18/2017 0940   BILITOT 0.9 08/18/2017 0940   BILIDIR 0.1 12/25/2015 0912   IBILI 0.5 12/25/2015 0912      Component Value Date/Time   TSH 1.470 08/03/2016 1458   TSH 2.09 03/26/2016 1600   TSH 3.10 12/25/2015 0912   Results for Kathy Clark, Kathy Clark (MRN [id number]) as of 03/07/2018 12:36  Ref. Range 08/18/2017 09:40  Vitamin D, 25-Hydroxy Latest Ref Range: 30.0 -  100.0 ng/mL 49.1   ASSESSMENT AND PLAN: Vitamin D deficiency - Plan: Vitamin D, Ergocalciferol, (DRISDOL) 50000 units CAPS capsule  Other depression - with emotional eating  - Plan: buPROPion (WELLBUTRIN SR) 150 MG 12 hr tablet  At risk for osteoporosis  Class 2 severe obesity with serious comorbidity and body mass index (BMI) of 35.0 to 35.9 in  adult, unspecified obesity type (Kathy Clark)  PLAN:  Vitamin D Deficiency Kathy Clark was informed that low vitamin D levels contributes to fatigue and are associated with obesity, breast, and colon cancer. She agrees to continue to take prescription Vit D @50 ,000 IU every week #4 with no refills and will follow up for routine testing of vitamin D, at least 2-3 times per year. She was informed of the risk of over-replacement of vitamin D and agrees to not increase her dose unless she discusses this with [place] first. Kathy Clark agrees to follow up as directed.  At risk for osteopenia and osteoporosis Kathy Clark is at risk for osteopenia and osteoporosis due to her vitamin D deficiency. She was encouraged to take her vitamin D and follow her higher calcium diet and increase strengthening exercise to help strengthen her bones and decrease her risk of osteopenia and osteoporosis.  Depression with Emotional Eating Behaviors We discussed behavior modification techniques today to help Aleece deal with her emotional eating and depression. She has agreed to continue Bupropion 150 mg qd #30 with no refills and follow up as directed.  Obesity Kathy Clark is currently in the action stage of change. As such, her goal is to continue with weight loss efforts She has agreed to keep a food journal with 1400 calories and 80+ grams of protein daily Kathy Clark has been instructed to work up to a goal of 150 minutes of combined cardio and strengthening exercise per week for weight loss and overall health benefits. We discussed the following Behavioral Modification Strategies today: planning for success, increasing lean protein intake, increasing vegetables and work on meal planning and easy cooking plans  Kathy Clark has agreed to follow up with our clinic in 2 weeks. She was informed of the importance of frequent follow up visits to maximize her success with intensive lifestyle modifications for her multiple health conditions.   OBESITY BEHAVIORAL  INTERVENTION VISIT  Today's visit was # 35   Starting weight: 237 lbs Starting date: 08/03/16 Today's weight : 213 lbs Today's date: 03/07/2018 Total lbs lost to date: 24 At least 15 minutes were spent on discussing the following behavioral intervention visit.   ASK: We discussed the diagnosis of obesity with Kathy Clark today and Kathy Clark agreed to give [place] permission to discuss obesity behavioral modification therapy today.  ASSESS: Kathy Clark has the diagnosis of obesity and her BMI today is 33.35 Kathy Clark is in the action stage of change   ADVISE: Kathy Clark was educated on the multiple health risks of obesity as well as the benefit of weight loss to improve her health. She was advised of the need for long term treatment and the importance of lifestyle modifications to improve her current health and to decrease her risk of future health problems.  AGREE: Multiple dietary modification options and treatment options were discussed and  Kathy Clark agreed to follow the recommendations documented in the above note.  ARRANGE: Kathy Clark was educated on the importance of frequent visits to treat obesity as outlined per CMS and USPSTF guidelines and agreed to schedule her next follow up appointment today.  [name], am acting as Location manager for [employer]  I have reviewed the above documentation for accuracy and completeness, and I agree with the above. - Ilene Qua, MD

## 2018-03-09 ENCOUNTER — Telehealth: Payer: 59 | Admitting: Family Medicine

## 2018-03-09 DIAGNOSIS — J019 Acute sinusitis, unspecified: Secondary | ICD-10-CM | POA: Diagnosis not present

## 2018-03-09 MED ORDER — AMOXICILLIN-POT CLAVULANATE 875-125 MG PO TABS: 1.0000 | ORAL_TABLET | Freq: Two times a day (BID) | ORAL | 0 refills | Status: AC

## 2018-03-09 MED ORDER — AZELASTINE HCL 0.1 % NA SOLN: 2.0000 | Freq: Two times a day (BID) | NASAL | 12 refills | Status: DC

## 2018-03-09 MED ORDER — FLUTICASONE PROPIONATE 50 MCG/ACT NA SUSP: 2.0000 | Freq: Every day | NASAL | 0 refills | Status: DC

## 2018-03-09 MED FILL — FLUTICASONE PROP 50 MCG SPR: 50 | 30 days supply | Qty: 16 | Fill #0

## 2018-03-09 MED FILL — AZELASTINE HCL 137 MCG/SPRA: 137 | 25 days supply | Qty: 30 | Fill #0

## 2018-03-09 MED FILL — AMOX-CLAV 875-125 MG TABLET: 875-125 | 7 days supply | Qty: 14 | Fill #0

## 2018-03-09 NOTE — Progress Notes (Signed)
We are sorry that you are not feeling well.  Here is how we plan to help!  Based on what you have shared with me it looks like you have sinusitis.  Sinusitis is inflammation and infection in the sinus cavities of the head.  Based on your presentation I believe you most likely have Acute Bacterial Sinusitis.  This is an infection caused by bacteria and is treated with antibiotics. I have prescribed Augmentin 875mg /125mg  one tablet twice daily with food, for 7 days. You may use an oral decongestant such as Mucinex D or if you have glaucoma or high blood pressure use plain Mucinex. Saline nasal spray help and can safely be used as often as needed for congestion.  If you develop worsening sinus pain, fever or notice severe headache and vision changes, or if symptoms are not better after completion of antibiotic, please schedule an appointment with a health care provider.    I also sent a Flonase nasal spray since you stated this has helped in the past and you are already using a non steroid nasal spray.  Sinus infections are not as easily transmitted as other respiratory infection, however we still recommend that you avoid close contact with loved ones, especially the very young and elderly.  Remember to wash your hands thoroughly throughout the day as this is the number one way to prevent the spread of infection!  Home Care:  Only take medications as instructed by your medical team.  Complete the entire course of an antibiotic.  Do not take these medications with alcohol.  A steam or ultrasonic humidifier can help congestion.  You can place a towel over your head and breathe in the steam from hot water coming from a faucet.  Avoid close contacts especially the very young and the elderly.  Cover your mouth when you cough or sneeze.  Always remember to wash your hands.  Get Help Right Away If:  You develop worsening fever or sinus pain.  You develop a severe head ache or visual  changes.  Your symptoms persist after you have completed your treatment plan.  Make sure you  Understand these instructions.  Will watch your condition.  Will get help right away if you are not doing well or get worse.  Your e-visit answers were reviewed by a board certified advanced clinical practitioner to complete your personal care plan.  Depending on the condition, your plan could have included both over the counter or prescription medications.  If there is a problem please reply  once you have received a response from your provider.  Your safety is important to [place].  If you have drug allergies check your prescription carefully.    You can use MyChart to ask questions about today's visit, request a non-urgent call back, or ask for a work or school excuse for 24 hours related to this e-Visit. If it has been greater than 24 hours you will need to follow up with your provider, or enter a new e-Visit to address those concerns.  You will get an e-mail in the next two days asking about your experience.  I hope that your e-visit has been valuable and will speed your recovery. Thank you for using e-visits.

## 2018-03-21 MED FILL — PANTOPRAZOLE SOD DR 40 MG T: 40 | 30 days supply | Qty: 30 | Fill #1

## 2018-03-22 ENCOUNTER — Ambulatory Visit (INDEPENDENT_AMBULATORY_CARE_PROVIDER_SITE_OTHER): Payer: Self-pay | Admitting: Family Medicine

## 2018-03-30 ENCOUNTER — Ambulatory Visit (INDEPENDENT_AMBULATORY_CARE_PROVIDER_SITE_OTHER): Payer: 59 | Admitting: Family Medicine

## 2018-03-30 VITALS — BP 107/71 | HR 68 | Temp 97.7°F | Ht 67.0 in | Wt 215.0 lb

## 2018-03-30 DIAGNOSIS — Z6833 Body mass index (BMI) 33.0-33.9, adult: Secondary | ICD-10-CM

## 2018-03-30 DIAGNOSIS — I10 Essential (primary) hypertension: Secondary | ICD-10-CM

## 2018-03-30 DIAGNOSIS — Z9189 Other specified personal risk factors, not elsewhere classified: Secondary | ICD-10-CM

## 2018-03-30 DIAGNOSIS — E669 Obesity, unspecified: Secondary | ICD-10-CM

## 2018-03-30 DIAGNOSIS — F3289 Other specified depressive episodes: Secondary | ICD-10-CM

## 2018-03-30 MED ORDER — BUPROPION HCL ER (SR) 150 MG PO TB12: 150.0000 mg | ORAL_TABLET | Freq: Every day | ORAL | 0 refills | Status: DC

## 2018-03-31 MED FILL — BUPROPION SR 150 MG TABLET: 150 | 30 days supply | Qty: 30 | Fill #0

## 2018-03-31 MED FILL — ESTRADIOL 0.075 MG PATCH: 0.075 | 84 days supply | Qty: 24 | Fill #3

## 2018-03-31 NOTE — Progress Notes (Signed)
Office: [phone]  /  Fax: [phone]   HPI:   Chief Complaint: OBESITY Kathy Clark is here to discuss her progress with her obesity treatment plan. She is keeping a food journal with 1400 calories and 80+ grams of protein and is following her eating plan approximately 50 % of the time. She states she is walking 10 minutes 7 times per week. Kathy Clark has had lots of stress with husband whom she is separated. Her dad has dementia and his house was sold recently. She is having trouble sleeping.   Her weight is 215 lb (97.5 kg) today and has not lost weight since her last visit. She has lost 22 lbs since starting treatment with [place].  Hypertension Kathy Clark is a 58 y.o. female with hypertension. Kathy Clark denies dizziness, lightheadedness, and fainting. She is working on weight loss to help control her blood pressure with the goal of decreasing her risk of heart attack and stroke. Kathy Clark's blood pressure is currently controlled.  At risk for cardiovascular disease Kathy Clark is at a higher than average risk for cardiovascular disease due to hypertension and obesity.   Depression with emotional eating behaviors Kathy Clark is struggling with emotional eating and using food for comfort to the extent that it is negatively impacting her health. She is having cravings. She shows no sign of suicidal or homicidal ideations.  Depression screen Kathy Clark 2/9 12/10/2017 11/02/2017 11/16/2016 08/03/2016 01/21/2016  Decreased Interest 1 1 2 3  0  Down, Depressed, Hopeless 1 1 2 3  0  PHQ - 2 Score 2 2 4 6  0  Altered sleeping 2 3 3 3 2   Tired, decreased energy 2 1 3 3 2   Change in appetite 1 1 1 3 1   Feeling bad or failure about yourself  1 1 1 3  0  Trouble concentrating 1 - 2 2 1   Moving slowly or fidgety/restless 0 0 1 0 0  Suicidal thoughts 0 0 0 0 0  PHQ-9 Score 9 8 15 20 6   Difficult doing work/chores Somewhat difficult Somewhat difficult - - Somewhat difficult        ALLERGIES: Allergies  Allergen  Reactions  . Succinylcholine Anaphylaxis  . Dilaudid [Hydromorphone Hcl] Nausea And Vomiting    MEDICATIONS: Current Outpatient Medications on File Prior to Visit  Medication Sig Dispense Refill  . AMBULATORY NON FORMULARY MEDICATION Medication Name: Please decrease CPAP setting to 8 cm of water pressure and fax [place] a download after 5 days.  Is having a lot of difficulty with air leaks is working to try to reduce her pressure to see if she is still being adequately treated but with fewer leaks.FAx to [place] 1 Units 0  . azelastine (ASTELIN) 0.1 % nasal spray Place 2 sprays into both nostrils 2 (two) times daily. Use in each nostril as directed 30 mL 12  . clonazePAM (KLONOPIN) 0.25 MG disintegrating tablet Take 1 tablet (0.25 mg total) by mouth daily as needed. 20 tablet 0  . cyclobenzaprine (FLEXERIL) 10 MG tablet Take 1 tablet (10 mg total) by mouth 3 (three) times daily as needed for muscle spasms. 30 tablet 0  . diclofenac sodium (VOLTAREN) 1 % GEL Apply 2 g topically 4 (four) times daily. To affected joint. 100 g 11  . estradiol (VIVELLE-DOT) 0.075 MG/24HR   3  . famotidine (PEPCID) 20 MG tablet Take 30 minutes before lunch and 30 minutes before dinner as needed for heartburn. 30 tablet 0  . fluticasone (FLONASE) 50 MCG/ACT nasal  spray Place 2 sprays into both nostrils daily. 16 g 0  . gabapentin (NEURONTIN) 300 MG capsule Take 300 mg by mouth 2 (two) times daily.     . hydrochlorothiazide (MICROZIDE) 12.5 MG capsule Take 1 capsule (12.5 mg total) by mouth daily. 90 capsule 3  . losartan (COZAAR) 25 MG tablet Take 1 tablet (25 mg total) by mouth daily. 90 tablet 3  . Melatonin 3 MG TABS Take by mouth as directed.    . meloxicam (MOBIC) 15 MG tablet TAKE 1 TABLET BY MOUTH DAILY AS NEEDED FOR PAIN. 14 tablet 0  . Multiple Vitamins-Minerals (MULTIVITAMIN ADULT PO) Take by mouth daily.    . naproxen (NAPROSYN) 500 MG tablet Take 1 tablet (500 mg total) by mouth 2 (two) times daily  with a meal. 30 tablet 0  . ondansetron (ZOFRAN ODT) 4 MG disintegrating tablet Take 1 tablet (4 mg total) by mouth every 8 (eight) hours as needed for nausea or vomiting. 30 tablet 0  . pantoprazole (PROTONIX) 40 MG tablet Take 1 tablet (40 mg total) by mouth daily. 30 tablet 3  . pyridOXINE (VITAMIN B-6) 50 MG tablet Take 1 tablet (50 mg total) by mouth daily. 90 tablet 3  . SUMAtriptan (IMITREX) 20 MG/ACT nasal spray PLACE 1 SPRAY INTO THE NOSE EVERY 2 HOURS AS NEEDED FOR. HEADACHE 6 Inhaler 2  . VIIBRYD 20 MG TABS TAKE 1 TABLET (20 MG TOTAL) BY MOUTH DAILY. 90 tablet 2  . Vitamin D, Ergocalciferol, (DRISDOL) 50000 units CAPS capsule Take 1 capsule (50,000 Units total) by mouth every 7 (seven) days. 4 capsule 0  . zolpidem (AMBIEN) 10 MG tablet Take 1 tablet (10 mg total) by mouth at bedtime as needed for sleep. 30 tablet 2   No current facility-administered medications on file prior to visit.     PAST MEDICAL HISTORY: Past Medical History:  Diagnosis Date  . Anxiety   . Back pain   . 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 Depression   . Essential hypertension, benign   . Gastritis   . GERD (gastroesophageal reflux disease)   . Hypertension   . Joint pain   . Lymphocytic colitis    microscopic  . Migraines   . Osteoarthritis   . Sciatica    right leg  . Swelling    feet, left foot    PAST SURGICAL HISTORY: Past Surgical History:  Procedure Laterality Date  . ABDOMINAL HYSTERECTOMY  07-2005  . [place] Bunionectomy Left 09/18/2016   Left Foot  . BACK SURGERY    . KNEE SURGERY  2006   left; multiple  . microdiscetomy  2005  . SHOULDER SURGERY  07-22-09   right  . SKIN CANCER EXCISION     eyelid  . TOTAL KNEE ARTHROPLASTY  07/27/2011   Procedure: TOTAL KNEE ARTHROPLASTY;  Surgeon: Gearlean Alf;  Location: [facility];  Service: Orthopedics;  Laterality: Left;    SOCIAL HISTORY: Social History   Tobacco Use  . Smoking status: Never Smoker  .  Smokeless tobacco: Never Used  Substance Use Topics  . Alcohol use: Yes    Comment: occasionally  . Drug use: No    FAMILY HISTORY: Family History  Problem Relation Age of Onset  . Breast cancer Unknown   . Tongue cancer Mother   . Breast cancer Mother   . Anxiety disorder Mother   . Depression Mother   . Heart disease Father   . Hypertension Father   .  Hyperlipidemia Father   . Stroke Paternal Grandmother   . Colon cancer Neg Hx     ROS: Review of Systems  Constitutional: Negative for weight loss.  Neurological: Negative for dizziness.       Negative for lightheadedness. Negative for fainting.  Endo/Heme/Allergies:       Positive for cravings.  Psychiatric/Behavioral: Positive for depression. Negative for suicidal ideas.       Negative for homicidal ideations.    PHYSICAL EXAM: Blood pressure 107/71, pulse 68, temperature 97.7 F (36.5 C), temperature source Oral, height 5\' 7"  (1.702 m), weight 215 lb (97.5 kg), SpO2 99 %. Body mass index is 33.67 kg/m. Physical Exam  Constitutional: She is oriented to person, place, and time. She appears well-developed and well-nourished.  Cardiovascular: Normal rate.  Pulmonary/Chest: Effort normal.  Musculoskeletal: Normal range of motion.  Neurological: She is oriented to person, place, and time.  Skin: Skin is warm and dry.  Psychiatric: She has a normal mood and affect. Her behavior is normal.  Vitals reviewed.   RECENT LABS AND TESTS: BMET    Component Value Date/Time   NA 142 08/18/2017 0940   K 4.0 08/18/2017 0940   CL 101 08/18/2017 0940   CO2 25 08/18/2017 0940   GLUCOSE 89 08/18/2017 0940   GLUCOSE 79 03/26/2016 1600   BUN 12 08/18/2017 0940   CREATININE 0.70 08/18/2017 0940   CREATININE 0.61 03/26/2016 1600   CALCIUM 9.4 08/18/2017 0940   GFRNONAA 96 08/18/2017 0940   GFRNONAA >89 03/26/2016 1600   GFRAA 111 08/18/2017 0940   GFRAA >89 03/26/2016 1600   Lab Results  Component Value Date   HGBA1C 5.1  12/01/2016   HGBA1C 4.9 08/03/2016   Lab Results  Component Value Date   INSULIN 5.4 12/01/2016   INSULIN 5.9 08/03/2016   CBC    Component Value Date/Time   WBC WILL FOLLOW 08/18/2017 0940   WBC 6.7 03/26/2016 1600   RBC WILL FOLLOW 08/18/2017 0940   RBC 4.68 03/26/2016 1600   HGB WILL FOLLOW 08/18/2017 0940   HCT WILL FOLLOW 08/18/2017 0940   PLT WILL FOLLOW 08/18/2017 0940   MCV WILL FOLLOW 08/18/2017 0940   MCH WILL FOLLOW 08/18/2017 0940   MCH 30.6 03/26/2016 1600   MCHC WILL FOLLOW 08/18/2017 0940   MCHC 33.6 03/26/2016 1600   RDW WILL FOLLOW 08/18/2017 0940   LYMPHSABS WILL FOLLOW 08/18/2017 0940   MONOABS 469 03/26/2016 1600   EOSABS WILL FOLLOW 08/18/2017 0940   BASOSABS WILL FOLLOW 08/18/2017 0940   Iron/TIBC/Ferritin/ %Sat    Component Value Date/Time   FERRITIN 142 12/25/2015 0912   Lipid Panel     Component Value Date/Time   CHOL 180 08/18/2017 0940   TRIG 87 08/18/2017 0940   HDL 60 08/18/2017 0940   CHOLHDL 3.0 04/05/2015 0937   VLDL 21 04/05/2015 0937   LDLCALC 103 (H) 08/18/2017 0940   Hepatic Function Panel     Component Value Date/Time   PROT 6.9 08/18/2017 0940   ALBUMIN 4.4 08/18/2017 0940   AST 16 08/18/2017 0940   ALT 22 08/18/2017 0940   ALKPHOS 47 08/18/2017 0940   BILITOT 0.9 08/18/2017 0940   BILIDIR 0.1 12/25/2015 0912   IBILI 0.5 12/25/2015 0912      Component Value Date/Time   TSH 1.470 08/03/2016 1458   TSH 2.09 03/26/2016 1600   TSH 3.10 12/25/2015 0912   Results for AANCHAL, COPE (MRN [id number]) as of 03/31/2018 14:52  Ref. Range  08/18/2017 09:40  Vitamin D, 25-Hydroxy Latest Ref Range: 30.0 - 100.0 ng/mL 49.1   ASSESSMENT AND PLAN: Other depression - with emotional eating  - Plan: buPROPion (WELLBUTRIN SR) 150 MG 12 hr tablet  Essential hypertension  At risk for heart disease  Class 1 obesity with serious comorbidity and body mass index (BMI) of 33.0 to 33.9 in adult, unspecified obesity  type  PLAN:  Hypertension We discussed sodium restriction, working on healthy weight loss, and a regular exercise program as the means to achieve improved blood pressure control. Kathy Clark agreed with this plan and agreed to follow up as directed. We will continue to monitor her blood pressure as well as her progress with the above lifestyle modifications. She will continue her medications as prescribed and will watch for signs of hypotension as she continues her lifestyle modifications. At her next appointment in 2 weeks, we may need to make a medication adjustment.   Cardiovascular risk counseling Kathy Clark was given extended (15 minutes) coronary artery disease prevention counseling today. She is 57 y.o. female and has risk factors for heart disease including hypertension and obesity. We discussed intensive lifestyle modifications today with an emphasis on specific weight loss instructions and strategies. Pt was also informed of the importance of increasing exercise and decreasing saturated fats to help prevent heart disease.  Depression with Emotional Eating Behaviors We discussed behavior modification techniques today to help Kathy Clark deal with her emotional eating and depression. She has agreed to take bupropion 150 mg po qd #30 with no refills and agreed to follow up as directed in 2 weeks.  Obesity Kathy Clark is currently in the action stage of change. As such, her goal is to continue with weight loss efforts. She has agreed to keeping a food journal with 1400 calories and 85+ calories daily. Kathy Clark has been instructed to work up to a goal of 150 minutes of combined cardio and strengthening exercise per week for weight loss and overall health benefits. We discussed the following Behavioral Modification Strategies today: increasing lean protein intake, increasing vegetables, work on meal planning and easy cooking plans, and planning for success.  Kathy Clark has agreed to follow up with our clinic in 2 weeks.  She was informed of the importance of frequent follow up visits to maximize her success with intensive lifestyle modifications for her multiple health conditions.   OBESITY BEHAVIORAL INTERVENTION VISIT  Today's visit was # 48  Starting weight: 237 lbs Starting date: 08/03/16 Today's weight : Weight: 215 lb (97.5 kg)  Today's date: 03/30/2018 Total lbs lost to date: 68  ASK: We discussed the diagnosis of obesity with Kathy Clark today and Kathy Clark agreed to give [place] permission to discuss obesity behavioral modification therapy today.  ASSESS: Kathy Clark has the diagnosis of obesity and her BMI today is 33.67. Kathy Clark is in the action stage of change.   ADVISE: Kathy Clark was educated on the multiple health risks of obesity as well as the benefit of weight loss to improve her health. She was advised of the need for long term treatment and the importance of lifestyle modifications to improve her current health and to decrease her risk of future health problems.  AGREE: Multiple dietary modification options and treatment options were discussed and Kathy Clark agreed to follow the recommendations documented in the above note.  ARRANGE: Kathy Clark was educated on the importance of frequent visits to treat obesity as outlined per CMS and USPSTF guidelines and agreed to schedule her next follow up appointment today.  I,  Marcille Blanco, am acting as transcriptionist for Eber Jones, MD  I have reviewed the above documentation for accuracy and completeness, and I agree with the above. - Ilene Qua, MD

## 2018-04-05 ENCOUNTER — Encounter: Payer: Self-pay | Admitting: Family Medicine

## 2018-04-05 ENCOUNTER — Telehealth: Payer: 59 | Admitting: Family

## 2018-04-05 DIAGNOSIS — K591 Functional diarrhea: Secondary | ICD-10-CM | POA: Diagnosis not present

## 2018-04-05 MED ORDER — VILAZODONE HCL 40 MG PO TABS: 40.0000 mg | ORAL_TABLET | Freq: Every day | ORAL | 1 refills | Status: DC

## 2018-04-05 MED ORDER — PROMETHAZINE HCL 25 MG PO TABS: 25.0000 mg | ORAL_TABLET | Freq: Three times a day (TID) | ORAL | 0 refills | Status: DC | PRN

## 2018-04-05 MED ORDER — HYOSCYAMINE SULFATE ER 0.375 MG PO TB12: 0.3750 mg | ORAL_TABLET | Freq: Two times a day (BID) | ORAL | 0 refills | Status: DC

## 2018-04-05 MED FILL — PROMETHAZINE 25 MG TABLET: 25 | 7 days supply | Qty: 20 | Fill #0

## 2018-04-05 MED FILL — OSCIMIN SR 0.375 MG TABLET: 0.375 | 30 days supply | Qty: 60 | Fill #0

## 2018-04-05 NOTE — Progress Notes (Signed)
We are sorry that you are not feeling well.  Here is how we plan to help!  Based on what you have shared with me it looks like you have Acute Infectious Diarrhea.  Most cases of acute diarrhea are due to infections with virus and bacteria and are self-limited conditions lasting less than 14 days.  For your symptoms you may take Imodium 2 mg tablets that are over the counter at your local pharmacy. Take two tablet now and then one after each loose stool up to 6 a day.  Antibiotics are not needed for most people with diarrhea.   Promethazine 25 mg take 1 tablet twice daily has been sent for nausea and Levbid for cramping.    HOME CARE  We recommend changing your diet to help with your symptoms for the next few days.  Drink plenty of fluids that contain water salt and sugar. Sports drinks such as Gatorade may help.   You may try broths, soups, bananas, applesauce, soft breads, mashed potatoes or crackers.   You are considered infectious for as long as the diarrhea continues. Hand washing or use of alcohol based hand sanitizers is recommend.  It is best to stay out of work or school until your symptoms stop.   GET HELP RIGHT AWAY  If you have dark yellow colored urine or do not pass urine frequently you should drink more fluids.    If your symptoms worsen   If you feel like you are going to pass out (faint)  You have a new problem  MAKE SURE YOU   Understand these instructions.  Will watch your condition.  Will get help right away if you are not doing well or get worse.  Your e-visit answers were reviewed by a board certified advanced clinical practitioner to complete your personal care plan.  Depending on the condition, your plan could have included both over the counter or prescription medications.  If there is a problem please reply  once you have received a response from your provider.  Your safety is important to [place].  If you have drug allergies check your prescription  carefully.    You can use MyChart to ask questions about today's visit, request a non-urgent call back, or ask for a work or school excuse for 24 hours related to this e-Visit. If it has been greater than 24 hours you will need to follow up with your provider, or enter a new e-Visit to address those concerns.   You will get an e-mail in the next two days asking about your experience.  I hope that your e-visit has been valuable and will speed your recovery. Thank you for using e-visits.

## 2018-04-06 MED FILL — VIIBRYD 40 MG TABLET: 40 | 30 days supply | Qty: 30 | Fill #0

## 2018-04-13 ENCOUNTER — Ambulatory Visit (INDEPENDENT_AMBULATORY_CARE_PROVIDER_SITE_OTHER): Payer: 59 | Admitting: Family Medicine

## 2018-04-13 VITALS — BP 112/66 | HR 65 | Temp 97.8°F | Ht 67.0 in | Wt 214.0 lb

## 2018-04-13 DIAGNOSIS — E559 Vitamin D deficiency, unspecified: Secondary | ICD-10-CM

## 2018-04-13 DIAGNOSIS — F3289 Other specified depressive episodes: Secondary | ICD-10-CM | POA: Diagnosis not present

## 2018-04-13 DIAGNOSIS — E669 Obesity, unspecified: Secondary | ICD-10-CM | POA: Diagnosis not present

## 2018-04-13 DIAGNOSIS — Z9189 Other specified personal risk factors, not elsewhere classified: Secondary | ICD-10-CM

## 2018-04-13 DIAGNOSIS — Z6833 Body mass index (BMI) 33.0-33.9, adult: Secondary | ICD-10-CM

## 2018-04-13 DIAGNOSIS — I10 Essential (primary) hypertension: Secondary | ICD-10-CM | POA: Diagnosis not present

## 2018-04-13 MED ORDER — VITAMIN D (ERGOCALCIFEROL) 1.25 MG (50000 UNIT) PO CAPS: 50000.0000 [IU] | ORAL_CAPSULE | ORAL | 0 refills | Status: DC

## 2018-04-13 MED FILL — VIT D2 1.25 MG (50,000 UNIT: 1.25 MG | 28 days supply | Qty: 4 | Fill #0

## 2018-04-14 NOTE — Progress Notes (Signed)
Office: [phone]  /  Fax: [phone]   HPI:   Chief Complaint: OBESITY Kathy Clark is here to discuss her progress with her obesity treatment plan. She is keeping a food journal with 1400 calories and 85+ grams of protein  and is following her eating plan approximately 75 % of the time. She states she is exercising 0 minutes 0 times per week. Kathy Clark tracked for 4 to 5 days per week. She is walking around the building to increase steps and exercise.  Her weight is 214 lb (97.1 kg) today and has had a weight loss of 1 pound over a period of 2 weeks since her last visit. She has lost 23 lbs since starting treatment with [place].  Depression with emotional eating behaviors Kathy Clark is struggling with emotional eating and using food for comfort to the extent that it is negatively impacting her health. She recently increased her Viibryd for increased life stress. She does see a psychiatrist.  Vitamin D deficiency Kathy Clark has a diagnosis of vitamin D deficiency. She is currently taking vit D. She admits fatigue and denies nausea, vomiting or muscle weakness.  At risk for osteopenia and osteoporosis [name] is at higher risk of osteopenia and osteoporosis due to vitamin D deficiency.   Hypertension Kathy Clark is a 57 y.o. female with hypertension. She is working on weight loss to help control her blood pressure with the goal of decreasing her risk of heart attack and stroke. She is taking HCTZ and losartan. Kathy Clark denies dizziness or lightheadedness.   ALLERGIES: Allergies  Allergen Reactions  . Succinylcholine Anaphylaxis  . Dilaudid [Hydromorphone Hcl] Nausea And Vomiting    MEDICATIONS: Current Outpatient Medications on File Prior to Visit  Medication Sig Dispense Refill  . AMBULATORY NON FORMULARY MEDICATION Medication Name: Please decrease CPAP setting to 8 cm of water pressure and fax [place] a download after 5 days.  Is having a lot of difficulty with air leaks is working to try to reduce her  pressure to see if she is still being adequately treated but with fewer leaks.FAx to [place] 1 Units 0  . azelastine (ASTELIN) 0.1 % nasal spray Place 2 sprays into both nostrils 2 (two) times daily. Use in each nostril as directed 30 mL 12  . buPROPion (WELLBUTRIN SR) 150 MG 12 hr tablet Take 1 tablet (150 mg total) by mouth daily. 30 tablet 0  . clonazePAM (KLONOPIN) 0.25 MG disintegrating tablet Take 1 tablet (0.25 mg total) by mouth daily as needed. 20 tablet 0  . cyclobenzaprine (FLEXERIL) 10 MG tablet Take 1 tablet (10 mg total) by mouth 3 (three) times daily as needed for muscle spasms. 30 tablet 0  . diclofenac sodium (VOLTAREN) 1 % GEL Apply 2 g topically 4 (four) times daily. To affected joint. 100 g 11  . estradiol (VIVELLE-DOT) 0.075 MG/24HR   3  . famotidine (PEPCID) 20 MG tablet Take 30 minutes before lunch and 30 minutes before dinner as needed for heartburn. 30 tablet 0  . fluticasone (FLONASE) 50 MCG/ACT nasal spray Place 2 sprays into both nostrils daily. 16 g 0  . gabapentin (NEURONTIN) 300 MG capsule Take 300 mg by mouth 2 (two) times daily.     . hydrochlorothiazide (MICROZIDE) 12.5 MG capsule Take 1 capsule (12.5 mg total) by mouth daily. 90 capsule 3  . hyoscyamine (LEVBID) 0.375 MG 12 hr tablet Take 1 tablet (0.375 mg total) by mouth 2 (two) times daily. 60 tablet 0  . losartan (COZAAR)  25 MG tablet Take 1 tablet (25 mg total) by mouth daily. (Patient taking differently: Take 25 mg by mouth daily. Take 1/2 tab daily) 90 tablet 3  . Melatonin 3 MG TABS Take by mouth as directed.    . meloxicam (MOBIC) 15 MG tablet TAKE 1 TABLET BY MOUTH DAILY AS NEEDED FOR PAIN. 14 tablet 0  . Multiple Vitamins-Minerals (MULTIVITAMIN ADULT PO) Take by mouth daily.    . naproxen (NAPROSYN) 500 MG tablet Take 1 tablet (500 mg total) by mouth 2 (two) times daily with a meal. 30 tablet 0  . ondansetron (ZOFRAN ODT) 4 MG disintegrating tablet Take 1 tablet (4 mg total) by mouth every 8  (eight) hours as needed for nausea or vomiting. 30 tablet 0  . pantoprazole (PROTONIX) 40 MG tablet Take 1 tablet (40 mg total) by mouth daily. 30 tablet 3  . promethazine (PHENERGAN) 25 MG tablet Take 1 tablet (25 mg total) by mouth every 8 (eight) hours as needed for nausea or vomiting. 20 tablet 0  . pyridOXINE (VITAMIN B-6) 50 MG tablet Take 1 tablet (50 mg total) by mouth daily. 90 tablet 3  . SUMAtriptan (IMITREX) 20 MG/ACT nasal spray PLACE 1 SPRAY INTO THE NOSE EVERY 2 HOURS AS NEEDED FOR. HEADACHE 6 Inhaler 2  . Vilazodone HCl (VIIBRYD) 40 MG TABS Take 1 tablet (40 mg total) by mouth daily. 30 tablet 1  . zolpidem (AMBIEN) 10 MG tablet Take 1 tablet (10 mg total) by mouth at bedtime as needed for sleep. 30 tablet 2   No current facility-administered medications on file prior to visit.     PAST MEDICAL HISTORY: Past Medical History:  Diagnosis Date  . Anxiety   . Back pain   . 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 Depression   . Essential hypertension, benign   . Gastritis   . GERD (gastroesophageal reflux disease)   . Hypertension   . Joint pain   . Lymphocytic colitis    microscopic  . Migraines   . Osteoarthritis   . Sciatica    right leg  . Swelling    feet, left foot    PAST SURGICAL HISTORY: Past Surgical History:  Procedure Laterality Date  . ABDOMINAL HYSTERECTOMY  07-2005  . [place] Bunionectomy Left 09/18/2016   Left Foot  . BACK SURGERY    . KNEE SURGERY  2006   left; multiple  . microdiscetomy  2005  . SHOULDER SURGERY  07-22-09   right  . SKIN CANCER EXCISION     eyelid  . TOTAL KNEE ARTHROPLASTY  07/27/2011   Procedure: TOTAL KNEE ARTHROPLASTY;  Surgeon: Gearlean Alf;  Location: [facility];  Service: Orthopedics;  Laterality: Left;    SOCIAL HISTORY: Social History   Tobacco Use  . Smoking status: Never Smoker  . Smokeless tobacco: Never Used  Substance Use Topics  . Alcohol use: Yes    Comment: occasionally  . Drug  use: No    FAMILY HISTORY: Family History  Problem Relation Age of Onset  . Breast cancer Unknown   . Tongue cancer Mother   . Breast cancer Mother   . Anxiety disorder Mother   . Depression Mother   . Heart disease Father   . Hypertension Father   . Hyperlipidemia Father   . Stroke Paternal Grandmother   . Colon cancer Neg Hx     ROS: Review of Systems  Constitutional: Positive for malaise/fatigue and weight loss.  Gastrointestinal:  Negative for nausea and vomiting.  Musculoskeletal:       Negative for muscle weakness.  Neurological: Negative for dizziness.       Negative for lightheadedness.  Psychiatric/Behavioral: Positive for depression.    PHYSICAL EXAM: Blood pressure 112/66, pulse 65, temperature 97.8 F (36.6 C), temperature source Oral, height 5\' 7"  (1.702 m), weight 214 lb (97.1 kg), SpO2 98 %. Body mass index is 33.52 kg/m. Physical Exam  Constitutional: She is oriented to person, place, and time. She appears well-developed and well-nourished.  Cardiovascular: Normal rate.  Pulmonary/Chest: Effort normal.  Musculoskeletal: Normal range of motion.  Neurological: She is oriented to person, place, and time.  Skin: Skin is warm and dry.  Psychiatric: She has a normal mood and affect. Her behavior is normal.  Vitals reviewed.   RECENT LABS AND TESTS: BMET    Component Value Date/Time   NA 142 08/18/2017 0940   K 4.0 08/18/2017 0940   CL 101 08/18/2017 0940   CO2 25 08/18/2017 0940   GLUCOSE 89 08/18/2017 0940   GLUCOSE 79 03/26/2016 1600   BUN 12 08/18/2017 0940   CREATININE 0.70 08/18/2017 0940   CREATININE 0.61 03/26/2016 1600   CALCIUM 9.4 08/18/2017 0940   GFRNONAA 96 08/18/2017 0940   GFRNONAA >89 03/26/2016 1600   GFRAA 111 08/18/2017 0940   GFRAA >89 03/26/2016 1600   Lab Results  Component Value Date   HGBA1C 5.1 12/01/2016   HGBA1C 4.9 08/03/2016   Lab Results  Component Value Date   INSULIN 5.4 12/01/2016   INSULIN 5.9  08/03/2016   CBC    Component Value Date/Time   WBC WILL FOLLOW 08/18/2017 0940   WBC 6.7 03/26/2016 1600   RBC WILL FOLLOW 08/18/2017 0940   RBC 4.68 03/26/2016 1600   HGB WILL FOLLOW 08/18/2017 0940   HCT WILL FOLLOW 08/18/2017 0940   PLT WILL FOLLOW 08/18/2017 0940   MCV WILL FOLLOW 08/18/2017 0940   MCH WILL FOLLOW 08/18/2017 0940   MCH 30.6 03/26/2016 1600   MCHC WILL FOLLOW 08/18/2017 0940   MCHC 33.6 03/26/2016 1600   RDW WILL FOLLOW 08/18/2017 0940   LYMPHSABS WILL FOLLOW 08/18/2017 0940   MONOABS 469 03/26/2016 1600   EOSABS WILL FOLLOW 08/18/2017 0940   BASOSABS WILL FOLLOW 08/18/2017 0940   Iron/TIBC/Ferritin/ %Sat    Component Value Date/Time   FERRITIN 142 12/25/2015 0912   Lipid Panel     Component Value Date/Time   CHOL 180 08/18/2017 0940   TRIG 87 08/18/2017 0940   HDL 60 08/18/2017 0940   CHOLHDL 3.0 04/05/2015 0937   VLDL 21 04/05/2015 0937   LDLCALC 103 (H) 08/18/2017 0940   Hepatic Function Panel     Component Value Date/Time   PROT 6.9 08/18/2017 0940   ALBUMIN 4.4 08/18/2017 0940   AST 16 08/18/2017 0940   ALT 22 08/18/2017 0940   ALKPHOS 47 08/18/2017 0940   BILITOT 0.9 08/18/2017 0940   BILIDIR 0.1 12/25/2015 0912   IBILI 0.5 12/25/2015 0912      Component Value Date/Time   TSH 1.470 08/03/2016 1458   TSH 2.09 03/26/2016 1600   TSH 3.10 12/25/2015 0912   Results for NATAYLA, CADENHEAD (MRN [id number]) as of 04/14/2018 09:08  Ref. Range 08/18/2017 09:40  Vitamin D, 25-Hydroxy Latest Ref Range: 30.0 - 100.0 ng/mL 49.1    ASSESSMENT AND PLAN: Vitamin D deficiency - Plan: Vitamin D, Ergocalciferol, (DRISDOL) 50000 units CAPS capsule  Essential hypertension  Other depression -  with emotional eating  At risk for osteoporosis  Class 1 obesity with serious comorbidity and body mass index (BMI) of 33.0 to 33.9 in adult, unspecified obesity type  PLAN:  Vitamin D Deficiency Maezie was informed that low vitamin D levels contributes  to fatigue and are associated with obesity, breast, and colon cancer. She agrees to continue to take prescription Vit D @50 ,000 IU every week #4 with no refills and will follow up for routine testing of vitamin D, at least 2-3 times per year. She was informed of the risk of over-replacement of vitamin D and agrees to not increase her dose unless she discusses this with [place] first. She agrees to follow up in 2 weeks.  At risk for osteopenia and osteoporosis Naileah was given extended (15 minutes) osteoporosis prevention counseling today.Frady is at risk for osteopenia and osteoporosis due to her vitamin D deficiency. She was encouraged to take her vitamin D and follow her higher calcium diet and increase strengthening exercise to help strengthen her bones and decrease her risk of osteopenia and osteoporosis.  Hypertension We discussed sodium restriction, working on healthy weight loss, and a regular exercise program as the means to achieve improved blood pressure control. Jacoria agreed with this plan and agreed to follow up as directed. We will continue to monitor her blood pressure as well as her progress with the above lifestyle modifications. Vincenzina agrees to decrease losartan to 12.5mg  (she will cut pills in half) and will watch for signs of hypotension as she continues her lifestyle modifications. She agrees to follow up in 2 weeks.  Depression with Emotional Eating Behaviors We discussed behavior modification techniques today to help Finnley deal with her emotional eating and depression. She agrees to continue to follow up with her psychiatry provider.  Obesity Psalm is currently in the action stage of change. As such, her goal is to continue with weight loss efforts. She has agreed to keep a food journal with 1400 calories and 80 grams of protein.  Jasamine has been instructed to work up to a goal of 150 minutes of combined cardio and strengthening exercise per week for weight loss and overall health  benefits. We discussed the following Behavioral Modification Strategies today: increasing lean protein intake, increasing vegetables, work on meal planning and easy cooking plans, planning for success, and keeping a strict food journal.  Toneisha has agreed to follow up with our clinic in 2 weeks. She was informed of the importance of frequent follow up visits to maximize her success with intensive lifestyle modifications for her multiple health conditions.   OBESITY BEHAVIORAL INTERVENTION VISIT  Today's visit was # 42  Starting weight: 237 lbs Starting date: 08/03/16 Today's weight : Weight: 214 lb (97.1 kg)  Today's date: 04/13/2018 Total lbs lost to date: 57  ASK: We discussed the diagnosis of obesity with Marianne Sofia today and [place] agreed to give [place] permission to discuss obesity behavioral modification therapy today.  ASSESS: Delesia has the diagnosis of obesity and her BMI today is 33.51. Chrissi is in the action stage of change.   ADVISE: Breane was educated on the multiple health risks of obesity as well as the benefit of weight loss to improve her health. She was advised of the need for long term treatment and the importance of lifestyle modifications to improve her current health and to decrease her risk of future health problems.  AGREE: Multiple dietary modification options and treatment options were discussed and Eilah agreed to follow the  recommendations documented in the above note.  ARRANGE: Aritha was educated on the importance of frequent visits to treat obesity as outlined per CMS and USPSTF guidelines and agreed to schedule her next follow up appointment today.  I, Marcille Blanco, am acting as Location manager for Eber Jones, MD  I have reviewed the above documentation for accuracy and completeness, and I agree with the above. - Ilene Qua, MD

## 2018-04-15 ENCOUNTER — Ambulatory Visit (INDEPENDENT_AMBULATORY_CARE_PROVIDER_SITE_OTHER): Payer: 59

## 2018-04-15 ENCOUNTER — Ambulatory Visit: Payer: 59 | Admitting: Family Medicine

## 2018-04-15 VITALS — BP 130/81 | HR 70 | Ht 68.0 in | Wt 219.0 lb

## 2018-04-15 DIAGNOSIS — M7989 Other specified soft tissue disorders: Secondary | ICD-10-CM

## 2018-04-15 DIAGNOSIS — M79672 Pain in left foot: Secondary | ICD-10-CM

## 2018-04-15 NOTE — Progress Notes (Signed)
Kathy Clark is a 57 y.o. female who presents to [facility] today for follow-up left foot pain and swelling.  Patient was seen about 6 weeks ago for left foot pain and swelling thought to be metatarsalgia or potentially metatarsal stress fracture.  She was placed in a postop shoe for 6 weeks.  She notes since she had some symptom improvement but still has pain and swelling.  She denies any repeat injury.  No fevers chills nausea vomiting or diarrhea   ROS as above:  Exam:  BP 130/81   Pulse 70   Ht 5\' 8"  (1.727 m)   Wt 219 lb (99.3 kg)   LMP  (LMP Unknown) Comment: hystrectomy  BMI 33.30 kg/m  Wt Readings from Last 5 Encounters:  04/15/18 219 lb (99.3 kg)  04/13/18 214 lb (97.1 kg)  03/30/18 215 lb (97.5 kg)  03/07/18 213 lb (96.6 kg)  03/04/18 215 lb (97.5 kg)    Gen: Well NAD  Exts: Brisk capillary refill, warm and well perfused.  Left foot well-appearing surgical scar overlying first metatarsal and MTP. Mild swelling across the dorsal forefoot.  Some swelling at second phalanx with slight early hammertoe formation. Foot and ankle motion are intact. Mildly tender to palpation at dorsal plantar midshaft of the second and third metatarsals. Tender to palpation plantar second metatarsal head. Pulses cap refill and sensation are intact.  Lab and Radiology Results No results found for this or any previous visit (from the past 72 hour(s)). Dg Foot Complete Left  Result Date: 04/15/2018 CLINICAL DATA:  Pain, swelling in left foot EXAM: LEFT FOOT - COMPLETE 3+ VIEW COMPARISON:  03/04/2018 FINDINGS: Postoperative changes in the left 1st metatarsal. No acute bony abnormality. Specifically, no fracture, subluxation, or dislocation. IMPRESSION: No acute bony abnormality. Electronically Signed   By: Rolm Baptise M.D.   On: 04/15/2018 08:48   I personally  (independently) visualized and performed the interpretation of the images attached in this note.    Assessment and Plan: 57 y.o. female with persistent but improved foot pain and swelling.  Stress fracture is a possibility as is continue metatarsalgia.  She has failed to improve.  After discussion plan for continued immobilization with postop shoe.  Okay to transition to regular shoe with turf toe insoles.  We discussed the possibility of getting an MRI today to further characterize the pain and evaluate more fully for stress fracture.  She is not disabled by the pain in its moderate she would like to hold off on getting an MRI for a little bit longer if possible.  Recheck in 6 weeks.    Orders Placed This Encounter  Procedures  . DG Foot Complete Left    Standing Status:   Future    Number of Occurrences:   1    Standing Expiration Date:   06/16/2019    Order Specific Question:   Reason for Exam (SYMPTOM  OR DIAGNOSIS REQUIRED)    Answer:   eval foot pain and swelling    Order Specific Question:   Is patient pregnant?    Answer:   No    Order Specific Question:   Preferred imaging location?    Answer:   [facility]    Order Specific Question:   Radiology Contrast Protocol - do NOT remove file path    Answer:   [email or web address]   No orders of the defined types were placed in this encounter.  Historical information moved to improve visibility of documentation.  Past Medical History:  Diagnosis Date  . Anxiety   . Back pain   . 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 Depression   . Essential hypertension, benign   . Gastritis   . GERD (gastroesophageal reflux disease)   . Hypertension   . Joint pain   . Lymphocytic colitis    microscopic  . Migraines   . Osteoarthritis   . Sciatica    right leg  . Swelling    feet, left foot   Past Surgical History:  Procedure Laterality Date  . ABDOMINAL  HYSTERECTOMY  07-2005  . [place] Bunionectomy Left 09/18/2016   Left Foot  . BACK SURGERY    . KNEE SURGERY  2006   left; multiple  . microdiscetomy  2005  . SHOULDER SURGERY  07-22-09   right  . SKIN CANCER EXCISION     eyelid  . TOTAL KNEE ARTHROPLASTY  07/27/2011   Procedure: TOTAL KNEE ARTHROPLASTY;  Surgeon: Gearlean Alf;  Location: [facility];  Service: Orthopedics;  Laterality: Left;   Social History   Tobacco Use  . Smoking status: Never Smoker  . Smokeless tobacco: Never Used  Substance Use Topics  . Alcohol use: Yes    Comment: occasionally   family history includes Anxiety disorder in her mother; Breast cancer in her mother and unknown relative; Depression in her mother; Heart disease in her father; Hyperlipidemia in her father; Hypertension in her father; Stroke in her paternal grandmother; Tongue cancer in her mother.  Medications: Current Outpatient Medications  Medication Sig Dispense Refill  . AMBULATORY NON FORMULARY MEDICATION Medication Name: Please decrease CPAP setting to 8 cm of water pressure and fax [place] a download after 5 days.  Is having a lot of difficulty with air leaks is working to try to reduce her pressure to see if she is still being adequately treated but with fewer leaks.FAx to [place] 1 Units 0  . azelastine (ASTELIN) 0.1 % nasal spray Place 2 sprays into both nostrils 2 (two) times daily. Use in each nostril as directed 30 mL 12  . buPROPion (WELLBUTRIN SR) 150 MG 12 hr tablet Take 1 tablet (150 mg total) by mouth daily. 30 tablet 0  . clonazePAM (KLONOPIN) 0.25 MG disintegrating tablet Take 1 tablet (0.25 mg total) by mouth daily as needed. 20 tablet 0  . cyclobenzaprine (FLEXERIL) 10 MG tablet Take 1 tablet (10 mg total) by mouth 3 (three) times daily as needed for muscle spasms. 30 tablet 0  . diclofenac sodium (VOLTAREN) 1 % GEL Apply 2 g topically 4 (four) times daily. To affected joint. 100 g 11  . estradiol (VIVELLE-DOT) 0.075  MG/24HR   3  . famotidine (PEPCID) 20 MG tablet Take 30 minutes before lunch and 30 minutes before dinner as needed for heartburn. 30 tablet 0  . fluticasone (FLONASE) 50 MCG/ACT nasal spray Place 2 sprays into both nostrils daily. 16 g 0  . gabapentin (NEURONTIN) 300 MG capsule Take 300 mg by mouth 2 (two) times daily.     . hydrochlorothiazide (MICROZIDE) 12.5 MG capsule Take 1 capsule (12.5 mg total) by mouth daily. 90 capsule 3  . hyoscyamine (LEVBID) 0.375 MG 12 hr tablet Take 1 tablet (0.375 mg total) by mouth 2 (two) times daily. 60 tablet 0  . losartan (COZAAR) 25 MG tablet Take 1 tablet (25 mg total) by mouth daily. (Patient taking differently: Take 25  mg by mouth daily. Take 1/2 tab daily) 90 tablet 3  . Melatonin 3 MG TABS Take by mouth as directed.    . meloxicam (MOBIC) 15 MG tablet TAKE 1 TABLET BY MOUTH DAILY AS NEEDED FOR PAIN. 14 tablet 0  . Multiple Vitamins-Minerals (MULTIVITAMIN ADULT PO) Take by mouth daily.    . naproxen (NAPROSYN) 500 MG tablet Take 1 tablet (500 mg total) by mouth 2 (two) times daily with a meal. 30 tablet 0  . ondansetron (ZOFRAN ODT) 4 MG disintegrating tablet Take 1 tablet (4 mg total) by mouth every 8 (eight) hours as needed for nausea or vomiting. 30 tablet 0  . pantoprazole (PROTONIX) 40 MG tablet Take 1 tablet (40 mg total) by mouth daily. 30 tablet 3  . promethazine (PHENERGAN) 25 MG tablet Take 1 tablet (25 mg total) by mouth every 8 (eight) hours as needed for nausea or vomiting. 20 tablet 0  . pyridOXINE (VITAMIN B-6) 50 MG tablet Take 1 tablet (50 mg total) by mouth daily. 90 tablet 3  . SUMAtriptan (IMITREX) 20 MG/ACT nasal spray PLACE 1 SPRAY INTO THE NOSE EVERY 2 HOURS AS NEEDED FOR. HEADACHE 6 Inhaler 2  . Vilazodone HCl (VIIBRYD) 40 MG TABS Take 1 tablet (40 mg total) by mouth daily. 30 tablet 1  . Vitamin D, Ergocalciferol, (DRISDOL) 50000 units CAPS capsule Take 1 capsule (50,000 Units total) by mouth every 7 (seven) days. 4 capsule 0  .  zolpidem (AMBIEN) 10 MG tablet Take 1 tablet (10 mg total) by mouth at bedtime as needed for sleep. 30 tablet 2   No current facility-administered medications for this visit.    Allergies  Allergen Reactions  . Succinylcholine Anaphylaxis  . Dilaudid [Hydromorphone Hcl] Nausea And Vomiting     Discussed warning signs or symptoms. Please see discharge instructions. Patient expresses understanding.

## 2018-04-15 NOTE — Patient Instructions (Addendum)
Thank you for coming in today. Continue post op shoe.  Ok to try to transition to regular shoe.  Consider turf toe insole.  Rechecks in about 6 weeks.  Return sooner if needed.  Continue metatarsal pads.  Consider hammer toe pad.   Get a Steel Turf Toe insole.  Do a Producer, television/film/video for [employer]

## 2018-04-22 ENCOUNTER — Ambulatory Visit (INDEPENDENT_AMBULATORY_CARE_PROVIDER_SITE_OTHER): Payer: 59

## 2018-04-22 ENCOUNTER — Ambulatory Visit: Payer: 59 | Admitting: Sports Medicine

## 2018-04-22 DIAGNOSIS — M7551 Bursitis of right shoulder: Secondary | ICD-10-CM

## 2018-04-22 DIAGNOSIS — G5603 Carpal tunnel syndrome, bilateral upper limbs: Secondary | ICD-10-CM | POA: Diagnosis not present

## 2018-04-22 DIAGNOSIS — M25511 Pain in right shoulder: Secondary | ICD-10-CM | POA: Diagnosis not present

## 2018-04-22 NOTE — Progress Notes (Signed)
Subjective:    CC: Shoulder pain, hand pain  HPI: This is a pleasant 57 year old female, I saw her approximately 3 years ago for a left subacromial bursitis, she responded well to a subacromial injection.  She has done well since then but is now having pain on her right side.  She is post rotator cuff repair, pain is localized over the deltoid, worse with overhead activities, waking her from sleep.,  Persistent without radiation.  No neck pain, no radiation to the hands or fingertips other than her classic carpal tunnel symptoms.  Considering her carpal tunnel symptoms, she has known bilateral carpal tunnel syndrome, previous median nerve hydrodissection was bilateral in about 4 months ago, now having recurrence of symptoms and desires repeat median nerve hydrodissection today.  I reviewed the past medical history, family history, social history, surgical history, and allergies today and no changes were needed.  Please see the problem list section below in epic for further details.  Past Medical History: Past Medical History:  Diagnosis Date  . Anxiety   . Back pain   . 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 Depression   . Essential hypertension, benign   . Gastritis   . GERD (gastroesophageal reflux disease)   . Hypertension   . Joint pain   . Lymphocytic colitis    microscopic  . Migraines   . Osteoarthritis   . Sciatica    right leg  . Swelling    feet, left foot   Past Surgical History: Past Surgical History:  Procedure Laterality Date  . ABDOMINAL HYSTERECTOMY  07-2005  . [place] Bunionectomy Left 09/18/2016   Left Foot  . BACK SURGERY    . KNEE SURGERY  2006   left; multiple  . microdiscetomy  2005  . SHOULDER SURGERY  07-22-09   right  . SKIN CANCER EXCISION     eyelid  . TOTAL KNEE ARTHROPLASTY  07/27/2011   Procedure: TOTAL KNEE ARTHROPLASTY;  Surgeon: Gearlean Alf;  Location: [facility];  Service: Orthopedics;  Laterality: Left;   Social  History: Social History   Socioeconomic History  . Marital status: Legally Separated    Spouse name: Jenny Reichmann  . Number of children: 2  . Years of education: Not on file  . Highest education level: Not on file  Occupational History  . Occupation: Programmer, multimedia: [employer]  . Financial resource strain: Not on file  . Food insecurity:    Worry: Not on file    Inability: Not on file  . Transportation needs:    Medical: Not on file    Non-medical: Not on file  Tobacco Use  . Smoking status: Never Smoker  . Smokeless tobacco: Never Used  Substance and Sexual Activity  . Alcohol use: Yes    Comment: occasionally  . Drug use: No  . 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  Lifestyle  . Physical activity:    Days per week: Not on file    Minutes per session: Not on file  . Stress: Not on file  Relationships  . Social connections:    Talks on phone: Not on file    Gets together: Not on file    Attends religious service: Not on file    Active member of club or organization: Not on file    Attends meetings of clubs or organizations: Not on file  Relationship status: Not on file  Other Topics Concern  . Not on file  Social History Narrative  . Not on file   Family History: Family History  Problem Relation Age of Onset  . Breast cancer Unknown   . Tongue cancer Mother   . Breast cancer Mother   . Anxiety disorder Mother   . Depression Mother   . Heart disease Father   . Hypertension Father   . Hyperlipidemia Father   . Stroke Paternal Grandmother   . Colon cancer Neg Hx    Allergies: Allergies  Allergen Reactions  . Succinylcholine Anaphylaxis  . Dilaudid [Hydromorphone Hcl] Nausea And Vomiting   Medications: See med rec.  Review of Systems: No fevers, chills, night sweats, weight loss, chest pain, or shortness of breath.   Objective:    General: Well Developed, well nourished, and in no  acute distress.  Neuro: Alert and oriented x3, extra-ocular muscles intact, sensation grossly intact.  HEENT: Normocephalic, atraumatic, pupils equal round reactive to light, neck supple, no masses, no lymphadenopathy, thyroid nonpalpable.  Skin: Warm and dry, no rashes. Cardiac: Regular rate and rhythm, no murmurs rubs or gallops, no lower extremity edema.  Respiratory: Clear to auscultation bilaterally. Not using accessory muscles, speaking in full sentences. Right shoulder: Inspection reveals no abnormalities, atrophy or asymmetry. Palpation is normal with no tenderness over AC joint or bicipital groove. ROM is full in all planes. Rotator cuff strength normal throughout. Positive Neer and Hawkin's tests, empty can. Positive speeds test, negative Yergason test. No labral pathology noted with negative Obrien's, negative crank, negative clunk, and good stability. Normal scapular function observed. No painful arc and no drop arm sign. No apprehension sign  Procedure: Real-time Ultrasound Guided hydrodissection of the left median nerve at the carpal tunnel Device: GE Logiq E  Verbal informed consent obtained.  Time-out conducted.  Noted no overlying erythema, induration, or other signs of local infection.  Skin prepped in a sterile fashion.  Local anesthesia: Topical Ethyl chloride.  With sterile technique and under real time ultrasound guidance: Using a 25-gauge needle advanced into the carpal tunnel, taking care to avoid intraneural injection I injected medication both superficial to and deep to the median nerve freeing it from surrounding structures, I then redirected the needle deep and injected further medication around the flexor tendons deep within the carpal tunnel for a total of 1 cc kenalog 40, 5 cc lidocaine. Completed without difficulty  Advised to call if fevers/chills, erythema, induration, drainage, or persistent bleeding.  Images permanently stored and available for review  in the ultrasound unit.  Impression: Technically successful ultrasound guided median nerve hydrodissection.  Procedure: Real-time Ultrasound Guided hydrodissection of the right median nerve at the carpal tunnel Device: GE Logiq E  Verbal informed consent obtained.  Time-out conducted.  Noted no overlying erythema, induration, or other signs of local infection.  Skin prepped in a sterile fashion.  Local anesthesia: Topical Ethyl chloride.  With sterile technique and under real time ultrasound guidance: Using a 25-gauge needle advanced into the carpal tunnel, taking care to avoid intraneural injection I injected medication both superficial to and deep to the median nerve freeing it from surrounding structures, I then redirected the needle deep and injected further medication around the flexor tendons deep within the carpal tunnel for a total of 1 cc kenalog 40, 5 cc lidocaine. Completed without difficulty  Advised to call if fevers/chills, erythema, induration, drainage, or persistent bleeding.  Images permanently stored and available for review in  the ultrasound unit.  Impression: Technically successful ultrasound guided median nerve hydrodissection.  Impression and Recommendations:    Subacromial bursitis Right subacromial bursitis. History of rotator cuff repair on the right. X-rays, subacromial injection, formal physical therapy. Return in 1 month.  Bilateral carpal tunnel syndrome Bilateral median nerve hydrodissection, with previous injection was over 4 months ago. ___________________________________________ Gwen Her. Dianah Field, M.D., ABFM., CAQSM. Primary Care and [employer] [place] Instructor of [facility] [employer] [facility] [employer]

## 2018-04-22 NOTE — Assessment & Plan Note (Signed)
Right subacromial bursitis. History of rotator cuff repair on the right. X-rays, subacromial injection, formal physical therapy. Return in 1 month.

## 2018-04-22 NOTE — Assessment & Plan Note (Signed)
Bilateral median nerve hydrodissection, with previous injection was over 4 months ago.

## 2018-04-22 NOTE — Progress Notes (Signed)
8y

## 2018-05-02 ENCOUNTER — Ambulatory Visit (INDEPENDENT_AMBULATORY_CARE_PROVIDER_SITE_OTHER): Payer: 59 | Admitting: Family Medicine

## 2018-05-02 VITALS — BP 137/84 | HR 67 | Temp 97.7°F | Ht 68.0 in | Wt 212.0 lb

## 2018-05-02 DIAGNOSIS — Z9189 Other specified personal risk factors, not elsewhere classified: Secondary | ICD-10-CM

## 2018-05-02 DIAGNOSIS — E559 Vitamin D deficiency, unspecified: Secondary | ICD-10-CM

## 2018-05-02 DIAGNOSIS — E669 Obesity, unspecified: Secondary | ICD-10-CM | POA: Diagnosis not present

## 2018-05-02 DIAGNOSIS — F3289 Other specified depressive episodes: Secondary | ICD-10-CM

## 2018-05-02 DIAGNOSIS — Z6833 Body mass index (BMI) 33.0-33.9, adult: Secondary | ICD-10-CM | POA: Diagnosis not present

## 2018-05-02 MED ORDER — BUPROPION HCL ER (SR) 150 MG PO TB12: 150.0000 mg | ORAL_TABLET | Freq: Every day | ORAL | 0 refills | Status: DC

## 2018-05-02 MED FILL — BUPROPION SR 150 MG TABLET: 150 | 30 days supply | Qty: 30 | Fill #0

## 2018-05-03 LAB — VITAMIN D 25 HYDROXY (VIT D DEFICIENCY, FRACTURES): VIT D 25 HYDROXY: 45.6 ng/mL (ref 30.0–100.0)

## 2018-05-03 LAB — LIPID PANEL WITH LDL/HDL RATIO
Cholesterol, Total: 196 mg/dL (ref 100–199)
HDL: 61 mg/dL (ref >39)
LDL Calculated: 120 mg/dL — ABNORMAL HIGH (ref 0–99)
LDL/HDL RATIO: 2 ratio (ref 0.0–3.2)
TRIGLYCERIDES: 74 mg/dL (ref 0–149)
VLDL CHOLESTEROL CAL: 15 mg/dL (ref 5–40)

## 2018-05-04 MED FILL — VIIBRYD 40 MG TABLET: 40 | 30 days supply | Qty: 30 | Fill #1

## 2018-05-04 NOTE — Progress Notes (Signed)
Office: [phone]  /  Fax: [phone]   HPI:   Chief Complaint: OBESITY Kathy Clark is here to discuss her progress with her obesity treatment plan. She is on the keep a food journal with 1400 calories and 80 grams of protein daily and is following her eating plan approximately 50 % of the time. She states she is walking for 30 minutes 3 times per week. Van has been mindful of what she is eating and portions sizes. She has significant emotional eating. She thinks she needs to reverse journaling. She started walking some since stress fracture healed.  Her weight is 212 lb (96.2 kg) today and has had a weight loss of 2 pounds over a period of 3 weeks since her last visit. She has lost 25 lbs since starting treatment with [place].  Vitamin D Deficiency Kathy Clark has a diagnosis of vitamin D deficiency. She is currently taking prescription Vit D. She notes fatigue and denies nausea, vomiting or muscle weakness.  At risk for osteopenia and osteoporosis Kathy Clark is at higher risk of osteopenia and osteoporosis due to vitamin D deficiency.   Depression with emotional eating behaviors Kathy Clark is struggling with emotional eating and using food for comfort to the extent that it is negatively impacting her health. She often snacks when she is not hungry. Kathy Clark sometimes feels she is out of control and then feels guilty that she made poor food choices. She has been working on behavior modification techniques to help reduce her emotional eating and has been somewhat successful. She shows no sign of suicidal or homicidal ideations.  Depression screen Kathy Clark [facility] 2/9 12/10/2017 11/02/2017 11/16/2016 08/03/2016 01/21/2016  Decreased Interest 1 1 2 3  0  Down, Depressed, Hopeless 1 1 2 3  0  PHQ - 2 Score 2 2 4 6  0  Altered sleeping 2 3 3 3 2   Tired, decreased energy 2 1 3 3 2   Change in appetite 1 1 1 3 1   Feeling bad or failure about yourself  1 1 1 3  0  Trouble concentrating 1 - 2 2 1   Moving slowly or fidgety/restless 0 0 1 0  0  Suicidal thoughts 0 0 0 0 0  PHQ-9 Score 9 8 15 20 6   Difficult doing work/chores Somewhat difficult Somewhat difficult - - Somewhat difficult    ALLERGIES: Allergies  Allergen Reactions  . Succinylcholine Anaphylaxis  . Dilaudid [Hydromorphone Hcl] Nausea And Vomiting    MEDICATIONS: Current Outpatient Medications on File Prior to Visit  Medication Sig Dispense Refill  . AMBULATORY NON FORMULARY MEDICATION Medication Name: Please decrease CPAP setting to 8 cm of water pressure and fax [place] a download after 5 days.  Is having a lot of difficulty with air leaks is working to try to reduce her pressure to see if she is still being adequately treated but with fewer leaks.FAx to [place] 1 Units 0  . azelastine (ASTELIN) 0.1 % nasal spray Place 2 sprays into both nostrils 2 (two) times daily. Use in each nostril as directed 30 mL 12  . clonazePAM (KLONOPIN) 0.25 MG disintegrating tablet Take 1 tablet (0.25 mg total) by mouth daily as needed. 20 tablet 0  . cyclobenzaprine (FLEXERIL) 10 MG tablet Take 1 tablet (10 mg total) by mouth 3 (three) times daily as needed for muscle spasms. 30 tablet 0  . diclofenac sodium (VOLTAREN) 1 % GEL Apply 2 g topically 4 (four) times daily. To affected joint. 100 g 11  . estradiol (VIVELLE-DOT) 0.075 MG/24HR  3  . famotidine (PEPCID) 20 MG tablet Take 30 minutes before lunch and 30 minutes before dinner as needed for heartburn. 30 tablet 0  . fluticasone (FLONASE) 50 MCG/ACT nasal spray Place 2 sprays into both nostrils daily. 16 g 0  . gabapentin (NEURONTIN) 300 MG capsule Take 300 mg by mouth 2 (two) times daily.     . hydrochlorothiazide (MICROZIDE) 12.5 MG capsule Take 1 capsule (12.5 mg total) by mouth daily. 90 capsule 3  . hyoscyamine (LEVBID) 0.375 MG 12 hr tablet Take 1 tablet (0.375 mg total) by mouth 2 (two) times daily. 60 tablet 0  . losartan (COZAAR) 25 MG tablet Take 1 tablet (25 mg total) by mouth daily. (Patient taking differently:  Take 25 mg by mouth daily. Take 1/2 tab daily) 90 tablet 3  . Melatonin 3 MG TABS Take by mouth as directed.    . meloxicam (MOBIC) 15 MG tablet TAKE 1 TABLET BY MOUTH DAILY AS NEEDED FOR PAIN. 14 tablet 0  . Multiple Vitamins-Minerals (MULTIVITAMIN ADULT PO) Take by mouth daily.    . naproxen (NAPROSYN) 500 MG tablet Take 1 tablet (500 mg total) by mouth 2 (two) times daily with a meal. 30 tablet 0  . ondansetron (ZOFRAN ODT) 4 MG disintegrating tablet Take 1 tablet (4 mg total) by mouth every 8 (eight) hours as needed for nausea or vomiting. 30 tablet 0  . pantoprazole (PROTONIX) 40 MG tablet Take 1 tablet (40 mg total) by mouth daily. 30 tablet 3  . promethazine (PHENERGAN) 25 MG tablet Take 1 tablet (25 mg total) by mouth every 8 (eight) hours as needed for nausea or vomiting. 20 tablet 0  . pyridOXINE (VITAMIN B-6) 50 MG tablet Take 1 tablet (50 mg total) by mouth daily. 90 tablet 3  . SUMAtriptan (IMITREX) 20 MG/ACT nasal spray PLACE 1 SPRAY INTO THE NOSE EVERY 2 HOURS AS NEEDED FOR. HEADACHE 6 Inhaler 2  . Vilazodone HCl (VIIBRYD) 40 MG TABS Take 1 tablet (40 mg total) by mouth daily. 30 tablet 1  . Vitamin D, Ergocalciferol, (DRISDOL) 50000 units CAPS capsule Take 1 capsule (50,000 Units total) by mouth every 7 (seven) days. 4 capsule 0  . zolpidem (AMBIEN) 10 MG tablet Take 1 tablet (10 mg total) by mouth at bedtime as needed for sleep. 30 tablet 2   No current facility-administered medications on file prior to visit.     PAST MEDICAL HISTORY: Past Medical History:  Diagnosis Date  . Anxiety   . Back pain   . 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 Depression   . Essential hypertension, benign   . Gastritis   . GERD (gastroesophageal reflux disease)   . Hypertension   . Joint pain   . Lymphocytic colitis    microscopic  . Migraines   . Osteoarthritis   . Sciatica    right leg  . Swelling    feet, left foot    PAST SURGICAL HISTORY: Past Surgical  History:  Procedure Laterality Date  . ABDOMINAL HYSTERECTOMY  07-2005  . [place] Bunionectomy Left 09/18/2016   Left Foot  . BACK SURGERY    . KNEE SURGERY  2006   left; multiple  . microdiscetomy  2005  . SHOULDER SURGERY  07-22-09   right  . SKIN CANCER EXCISION     eyelid  . TOTAL KNEE ARTHROPLASTY  07/27/2011   Procedure: TOTAL KNEE ARTHROPLASTY;  Surgeon: Gearlean Alf;  Location: [facility];  Service:  Orthopedics;  Laterality: Left;    SOCIAL HISTORY: Social History   Tobacco Use  . Smoking status: Never Smoker  . Smokeless tobacco: Never Used  Substance Use Topics  . Alcohol use: Yes    Comment: occasionally  . Drug use: No    FAMILY HISTORY: Family History  Problem Relation Age of Onset  . Breast cancer Unknown   . Tongue cancer Mother   . Breast cancer Mother   . Anxiety disorder Mother   . Depression Mother   . Heart disease Father   . Hypertension Father   . Hyperlipidemia Father   . Stroke Paternal Grandmother   . Colon cancer Neg Hx     ROS: Review of Systems  Constitutional: Positive for malaise/fatigue and weight loss.  Gastrointestinal: Negative for nausea and vomiting.  Musculoskeletal:       Negative muscle weakness  Psychiatric/Behavioral: Positive for depression. Negative for suicidal ideas.    PHYSICAL EXAM: Blood pressure 137/84, pulse 67, temperature 97.7 F (36.5 C), height 5\' 8"  (1.727 m), weight 212 lb (96.2 kg), SpO2 98 %. Body mass index is 32.23 kg/m. Physical Exam  Constitutional: She is oriented to person, place, and time. She appears well-developed and well-nourished.  Cardiovascular: Normal rate.  Pulmonary/Chest: Effort normal.  Musculoskeletal: Normal range of motion.  Neurological: She is oriented to person, place, and time.  Skin: Skin is warm and dry.  Psychiatric: She has a normal mood and affect. Her behavior is normal.  Vitals reviewed.   RECENT LABS AND TESTS: BMET    Component Value Date/Time   NA 142  08/18/2017 0940   K 4.0 08/18/2017 0940   CL 101 08/18/2017 0940   CO2 25 08/18/2017 0940   GLUCOSE 89 08/18/2017 0940   GLUCOSE 79 03/26/2016 1600   BUN 12 08/18/2017 0940   CREATININE 0.70 08/18/2017 0940   CREATININE 0.61 03/26/2016 1600   CALCIUM 9.4 08/18/2017 0940   GFRNONAA 96 08/18/2017 0940   GFRNONAA >89 03/26/2016 1600   GFRAA 111 08/18/2017 0940   GFRAA >89 03/26/2016 1600   Lab Results  Component Value Date   HGBA1C 5.1 12/01/2016   HGBA1C 4.9 08/03/2016   Lab Results  Component Value Date   INSULIN 5.4 12/01/2016   INSULIN 5.9 08/03/2016   CBC    Component Value Date/Time   WBC WILL FOLLOW 08/18/2017 0940   WBC 6.7 03/26/2016 1600   RBC WILL FOLLOW 08/18/2017 0940   RBC 4.68 03/26/2016 1600   HGB WILL FOLLOW 08/18/2017 0940   HCT WILL FOLLOW 08/18/2017 0940   PLT WILL FOLLOW 08/18/2017 0940   MCV WILL FOLLOW 08/18/2017 0940   MCH WILL FOLLOW 08/18/2017 0940   MCH 30.6 03/26/2016 1600   MCHC WILL FOLLOW 08/18/2017 0940   MCHC 33.6 03/26/2016 1600   RDW WILL FOLLOW 08/18/2017 0940   LYMPHSABS WILL FOLLOW 08/18/2017 0940   MONOABS 469 03/26/2016 1600   EOSABS WILL FOLLOW 08/18/2017 0940   BASOSABS WILL FOLLOW 08/18/2017 0940   Iron/TIBC/Ferritin/ %Sat    Component Value Date/Time   FERRITIN 142 12/25/2015 0912   Lipid Panel     Component Value Date/Time   CHOL 196 05/02/2018 0943   TRIG 74 05/02/2018 0943   HDL 61 05/02/2018 0943   CHOLHDL 3.0 04/05/2015 0937   VLDL 21 04/05/2015 0937   LDLCALC 120 (H) 05/02/2018 0943   Hepatic Function Panel     Component Value Date/Time   PROT 6.9 08/18/2017 0940   ALBUMIN 4.4  08/18/2017 0940   AST 16 08/18/2017 0940   ALT 22 08/18/2017 0940   ALKPHOS 47 08/18/2017 0940   BILITOT 0.9 08/18/2017 0940   BILIDIR 0.1 12/25/2015 0912   IBILI 0.5 12/25/2015 0912      Component Value Date/Time   TSH 1.470 08/03/2016 1458   TSH 2.09 03/26/2016 1600   TSH 3.10 12/25/2015 0912  Results for LYLLIANA, KITAMURA (MRN [id number]) as of 05/04/2018 16:31  Ref. Range 05/02/2018 09:43  Vitamin D, 25-Hydroxy Latest Ref Range: 30.0 - 100.0 ng/mL 45.6    ASSESSMENT AND PLAN: Vitamin D deficiency - Plan: VITAMIN D 25 Hydroxy (Vit-D Deficiency, Fractures), Lipid Panel With LDL/HDL Ratio  Other depression - Plan: buPROPion (WELLBUTRIN SR) 150 MG 12 hr tablet  At risk for osteoporosis  Class 1 obesity with serious comorbidity and body mass index (BMI) of 33.0 to 33.9 in adult, unspecified obesity type  Other depression - with emotional eating  - Plan: buPROPion (WELLBUTRIN SR) 150 MG 12 hr tablet  PLAN:  Vitamin D Deficiency Kathy Clark was informed that low vitamin D levels contributes to fatigue and are associated with obesity, breast, and colon cancer. Kathy Clark agrees to continue taking prescription Vit D @50 ,000 IU every week and will follow up for routine testing of vitamin D, at least 2-3 times per year. She was informed of the risk of over-replacement of vitamin D and agrees to not increase her dose unless she discusses this with [place] first. We will check labs today and Kathy Clark agrees to follow up with our clinic in 2 weeks.  At risk for osteopenia and osteoporosis Kathy Clark was given extended (15 minutes) osteoporosis prevention counseling today. Kathy Clark is at risk for osteopenia and osteoporsis due to her vitamin D deficiency. She was encouraged to take her vitamin D and follow her higher calcium diet and increase strengthening exercise to help strengthen her bones and decrease her risk of osteopenia and osteoporosis.  Depression with Emotional Eating Behaviors We discussed behavior modification techniques today to help Kathy Clark deal with her emotional eating and depression. Kathy Clark agrees to continue taking Wellbutrin SR 150 mg q AM #30 and we will refill for 1 month. Kathy Clark agrees to follow up with our clinic in 2 weeks.  Obesity Kathy Clark is currently in the action stage of change. As such, her goal is to continue  with weight loss efforts She has agreed to keep a food journal with 1400 calories and 80+ grams of protein daily Kathy Clark has been instructed to work up to a goal of 150 minutes of combined cardio and strengthening exercise per week for weight loss and overall health benefits. We discussed the following Behavioral Modification Strategies today: increasing lean protein intake, increasing vegetables, and planning for success   Kathy Clark has agreed to follow up with our clinic in 2 weeks. She was informed of the importance of frequent follow up visits to maximize her success with intensive lifestyle modifications for her multiple health conditions.   OBESITY BEHAVIORAL INTERVENTION VISIT  Today's visit was # 41   Starting weight: 237 lbs Starting date: 08/03/16 Today's weight : 212 lbs  Today's date: 05/02/2018 Total lbs lost to date: 25    ASK: We discussed the diagnosis of obesity with Kathy Clark today and Kathy Clark agreed to give [place] permission to discuss obesity behavioral modification therapy today.  ASSESS: Kathy Clark has the diagnosis of obesity and her BMI today is 32.24 Kathy Clark is in the action stage of change   ADVISE: Kathy Clark was  educated on the multiple health risks of obesity as well as the benefit of weight loss to improve her health. She was advised of the need for long term treatment and the importance of lifestyle modifications to improve her current health and to decrease her risk of future health problems.  AGREE: Multiple dietary modification options and treatment options were discussed and  Kathy Clark agreed to follow the recommendations documented in the above note.  ARRANGE: Kathy Clark was educated on the importance of frequent visits to treat obesity as outlined per CMS and USPSTF guidelines and agreed to schedule her next follow up appointment today.  I, Trixie Dredge, am acting as transcriptionist for Ilene Qua, MD  I have reviewed the above documentation for accuracy  and completeness, and I agree with the above. - Ilene Qua, MD

## 2018-05-09 MED FILL — GABAPENTIN 300 MG CAPSULE: 300 | 90 days supply | Qty: 180 | Fill #2

## 2018-05-09 MED FILL — LOSARTAN POTASSIUM 25 MG TA: 25 | 90 days supply | Qty: 90 | Fill #2

## 2018-05-09 MED FILL — HYDROCHLOROTHIAZIDE 12.5 MG: 12.5 | 90 days supply | Qty: 90 | Fill #2

## 2018-05-10 ENCOUNTER — Ambulatory Visit: Payer: 59 | Admitting: Family Medicine

## 2018-05-11 ENCOUNTER — Ambulatory Visit: Payer: 59 | Admitting: Physical Therapy

## 2018-05-11 ENCOUNTER — Encounter: Payer: Self-pay | Admitting: Physical Therapy

## 2018-05-11 DIAGNOSIS — M25511 Pain in right shoulder: Secondary | ICD-10-CM

## 2018-05-11 DIAGNOSIS — M6281 Muscle weakness (generalized): Secondary | ICD-10-CM | POA: Diagnosis not present

## 2018-05-11 DIAGNOSIS — R293 Abnormal posture: Secondary | ICD-10-CM | POA: Diagnosis not present

## 2018-05-11 NOTE — Patient Instructions (Signed)
Access Code: [email or web address]  URL: [email or web address]  Date: 05/11/2018  Prepared by: Faustino Congress   Exercises  Doorway Pec Stretch at 90 Degrees Abduction - 3 reps - 1 sets - 30 sec hold - 2x daily - 7x weekly  Scapular Retraction with Resistance - 15 reps - 1 sets - 5 sec hold - 2x daily - 7x weekly  Shoulder External Rotation with Anchored Resistance - 15 reps - 1 sets - 2x daily - 7x weekly  Shoulder Internal Rotation with Resistance - 15 reps - 1 sets - 2x daily - 7x weekly  Patient Education  Trigger Point Dry Needling

## 2018-05-11 NOTE — Therapy (Signed)
[place], [place], [address] Phone: [phone]   Fax:  [phone]  Physical Therapy Evaluation  Patient Details  Name: Kathy Clark MRN: [id number] Date of Birth: [birth year] Referring Provider (PT): Silverio Decamp, MD   Encounter Date: 05/11/2018  PT End of Session - 05/11/18 1237    Visit Number  1    Number of Visits  6    Date for PT Re-Evaluation  06/22/18    Authorization Type  [place]    PT Start Time  0717    PT Stop Time  0755    PT Time Calculation (min)  38 min    Activity Tolerance  Patient tolerated treatment well    Behavior During Therapy  [facility] for tasks assessed/performed       Past Medical History:  Diagnosis Date  . Anxiety   . Back pain   . 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 Depression   . Essential hypertension, benign   . Gastritis   . GERD (gastroesophageal reflux disease)   . Hypertension   . Joint pain   . Lymphocytic colitis    microscopic  . Migraines   . Osteoarthritis   . Sciatica    right leg  . Swelling    feet, left foot    Past Surgical History:  Procedure Laterality Date  . ABDOMINAL HYSTERECTOMY  07-2005  . [place] Bunionectomy Left 09/18/2016   Left Foot  . BACK SURGERY    . KNEE SURGERY  2006   left; multiple  . microdiscetomy  2005  . SHOULDER SURGERY  07-22-09   right  . SKIN CANCER EXCISION     eyelid  . TOTAL KNEE ARTHROPLASTY  07/27/2011   Procedure: TOTAL KNEE ARTHROPLASTY;  Surgeon: Gearlean Alf;  Location: [facility];  Service: Orthopedics;  Laterality: Left;    There were no vitals filed for this visit.   Subjective Assessment - 05/11/18 0718    Subjective  Pt is a 57 y/o female who presents to OPPT for Rt shoulder pain.  US showed Rt subacromial bursitis 2 weeks ago.  Pt attributes pain to repetitive carrying of heavy equipment.    Pertinent History  Rt RTC repair ("many years ago")     Limitations  Lifting    Patient Stated Goals  improve pain, be able to decrease risk of reinjury    Currently in Pain?  Yes    Pain Score  0-No pain   up to 4/10   Pain Location  Shoulder    Pain Orientation  Right    Pain Descriptors / Indicators  Sharp    Pain Type  Acute pain    Pain Onset  More than a month ago    Pain Frequency  Intermittent    Aggravating Factors   external rotation, functional internal rotation (improved since injection)    Pain Relieving Factors  injection, rest         [facility] PT Assessment - 05/11/18 0723      Assessment   Medical Diagnosis  M75.51 (ICD-10-CM) - Subacromial bursitis of right shoulder joint    Referring Provider (PT)  Silverio Decamp, MD    Onset Date/Surgical Date  --   1 month   Hand Dominance  Right    Next MD Visit  2 weeks    Prior Therapy  following shoulder surgery      Precautions  Precautions  None      Restrictions   Weight Bearing Restrictions  No      Balance Screen   Has the patient fallen in the past 6 months  No    Has the patient had a decrease in activity level because of a fear of falling?   No    Is the patient reluctant to leave their home because of a fear of falling?   No      Home Film/video editor residence    Living Arrangements  Alone      Prior Function   Level of Independence  Independent    Vocation  Full time employment    Loss adjuster, chartered; works on [employer], [employer] build; desk work    Leisure  plays keyboard, musical activities; walking      Cognition   Overall Cognitive Status  Within Functional Limits for tasks assessed      Observation/Other Assessments   Focus on Therapeutic Outcomes (FOTO)   62 (38% limited; predicted 30% limited)      Posture/Postural Control   Posture/Postural Control  Postural limitations    Postural Limitations  Rounded Shoulders;Forward head      ROM / Strength   AROM / PROM / Strength  AROM;Strength      AROM    Overall AROM Comments  bil shoulders WNL with pain with Rt functional internal rotation      Strength   Strength Assessment Site  Shoulder    Right/Left Shoulder  Right;Left    Right Shoulder Flexion  3+/5    Right Shoulder ABduction  3+/5    Right Shoulder Internal Rotation  5/5    Right Shoulder External Rotation  4/5    Left Shoulder Flexion  5/5    Left Shoulder ABduction  5/5    Left Shoulder Internal Rotation  --    Left Shoulder External Rotation  5/5      Palpation   Palpation comment  active trigger points in Rt upper trap; pain with palpation proximal biceps tendon                Objective measurements completed on examination: See above findings.      [facility] Adult PT Treatment/Exercise - 05/11/18 0723      Exercises   Exercises  Shoulder      Shoulder Exercises: Standing   External Rotation  Right;5 reps;Theraband    Theraband Level (Shoulder External Rotation)  Level 2 (Red)    Internal Rotation  Right;5 reps;Theraband    Theraband Level (Shoulder Internal Rotation)  Level 2 (Red)    Row  5 reps;Theraband    Theraband Level (Shoulder Row)  Level 2 (Red)      Shoulder Exercises: Stretch   Other Shoulder Stretches  doorway stretch 2x30 sec             PT Education - 05/11/18 1027    Education Details  HEP    Person(s) Educated  Patient    Methods  Explanation;Demonstration;Handout    Comprehension  Verbalized understanding;Returned demonstration;Need further instruction          PT Long Term Goals - 05/11/18 1249      PT LONG TERM GOAL #1   Title  independent with HEP    Status  New    Target Date  06/22/18      PT LONG TERM GOAL #2   Title  perform Rt shoulder ROM  without pain for improved function    Status  New    Target Date  06/22/18      PT LONG TERM GOAL #3   Title  demonstrate 4+/5 for improved function and mobility    Status  New    Target Date  06/22/18      PT LONG TERM GOAL #4   Title  verbalize understanding of  lifting and carrying techniques to decrease risk of reinjury    Status  New    Target Date  06/22/18      PT LONG TERM GOAL #5   Title  FOTO score improved to </= 30% limited    Status  New    Target Date  06/22/18             Plan - 05/11/18 1242    Clinical Impression Statement  Pt is a 57 y/o female who presents to OPPT for Rt shoulder pain x 1 month.  Pt with significantly improved symptoms following injection from MD, but continues to demonstrate poor posture, decreased strength and mild pain with ROM.  Will benefit from PT to address deficits.  Injury likely due to overuse and increased carrying of equipment.    Clinical Presentation  Stable    Clinical Decision Making  Low    Rehab Potential  Good    PT Frequency  1x / week    PT Duration  6 weeks    PT Treatment/Interventions  ADLs/Self Care Home Management;Cryotherapy;Electrical Stimulation;Ultrasound;Iontophoresis 4mg /ml Dexamethasone;Functional mobility training;Therapeutic activities;Therapeutic exercise;Patient/family education;Manual techniques;Taping;Dry needling;Passive range of motion;Vasopneumatic Device    PT Next Visit Plan  review HEP and progress, DN/manual/modalities PRN    PT Home Exercise Plan  Access Code: [email or web address]    Consulted and Agree with Plan of Care  Patient       Patient will benefit from skilled therapeutic intervention in order to improve the following deficits and impairments:  Increased fascial restricitons, Increased muscle spasms, Pain, Postural dysfunction, Decreased strength, Decreased range of motion, Impaired UE functional use  Visit Diagnosis: Acute pain of right shoulder - Plan: PT plan of care cert/re-cert  Abnormal posture - Plan: PT plan of care cert/re-cert  Muscle weakness (generalized) - Plan: PT plan of care cert/re-cert     Problem List Patient Active Problem List   Diagnosis Date Noted  . Metatarsalgia of left foot 01/05/2018  . Other hyperlipidemia 03/23/2017  .  OSA (obstructive sleep apnea) 02/25/2017  . Class 1 obesity without serious comorbidity with body mass index (BMI) of 34.0 to 34.9 in adult 08/17/2016  . Elevated liver function tests 08/17/2016  . Vitamin D deficiency 08/03/2016  . Left tennis elbow 04/06/2016  . 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 Depression 08/23/2015  . Subacromial bursitis 04/23/2015  . Bilateral carpal tunnel syndrome 12/24/2014  . Trochanteric bursitis of right hip 04/21/2013  . Postop Mild Hyponatremia 07/30/2011  . OSTEOARTHRITIS, KNEE, LEFT 12/12/2009  . MIGRAINE WITH AURA 05/31/2008  . SKIN CANCER, HX OF 01/06/2008  . COLITIS, HX OF 01/06/2008  . ALLERGIC RHINITIS 05/20/2007  . HYPERTENSION, BENIGN ESSENTIAL 11/23/2006  . SCIATICA, CHRONIC 05/19/2006      Laureen Abrahams, PT, DPT 05/11/18 1:08 PM     [facility] [place], [place], [address] Phone: [phone]   Fax:  [phone]  Name: LILLYTH SPONG MRN: [id number] Date of Birth: March 18, 1961

## 2018-05-12 ENCOUNTER — Telehealth: Payer: 59 | Admitting: Family

## 2018-05-12 DIAGNOSIS — R42 Dizziness and giddiness: Secondary | ICD-10-CM | POA: Diagnosis not present

## 2018-05-12 MED ORDER — MECLIZINE HCL 12.5 MG PO TABS: 12.5000 mg | ORAL_TABLET | Freq: Three times a day (TID) | ORAL | 0 refills | Status: DC | PRN

## 2018-05-12 NOTE — Progress Notes (Signed)
Thank you for the details you included in the comment boxes. Those details are very helpful in determining the best course of treatment for you and help [place] to provide the best care. I will call you with more details. Not to alarm you, but it could be focal seizures, small stroke (unlikely), or another neurological condition. If you are not having other serious neurological deficits, this is a priority yet not an emergency.   E Visit for Motion Sickness  We are sorry that you are not feeling well. Here is how we plan to help!  Based on what you have shared with me it looks like you have symptoms of motion sickness.  I have prescribed a medication that will help prevent or alleviate your symptoms:  Antivert 12.5mg , take 1-2 tabs every 8 hours as needed for vertigo or motion sickness.    Prevention:  You might feel better if you keep your eyes focused on outside while you are in motion. For example, if you are in a car, sit in the front and look in the direction you are moving; if you are on a boat, stay on the deck and look to the horizon. This helps make what you see match the movement you are feeling, and so you are less likely to feel sick.  You should also avoid reading, watching a movie, texting or reading messages, or looking at things close to you inside the vehicle you are riding in.  . Use the seat head rest. Lean your head against the back of the seat or head rest when traveling in vehicles with seats to minimize head movements.  . On a ship: When making your reservations, choose a cabin in the middle of the ship and near the waterline. When on board, go up on deck and focus on the horizon.  . In an airplane: Request a window seat and look out the window. A seat over the front edge of the wing is the most preferable spot (the degree of motion is the lowest here). Direct the air vent to blow cool air on your face.  . On a train: Always face forward and sit near a window.  . In a  vehicle: Sit in the front seat; if you are the passenger, look at the scenery in the distance. For some people, driving the vehicle (rather than being a passenger) is an instant remedy.  . Avoid others who have become nauseous with motion sickness. Seeing and smelling others who have motion sickness may cause you to become sick.  GET HELP RIGHT AWAY IF:   Your symptoms do not improve or worsen within 2 days after treatment.   You cannot keep down fluids after trying the medication.   Other associated symptoms such as severe headache, visual field changes, fever, or intractable nausea and vomiting.  MAKE SURE YOU:   Understand these instructions.  Will watch your condition.  Will get help right away if you are not doing well or get worse.  Thank you for choosing an e-visit.  Your e-visit answers were reviewed by a board certified advanced clinical practitioner to complete your personal care plan. Depending upon the condition, your plan could have included both over the counter or prescription medications.  Please review your pharmacy choice. Be sure that the pharmacy you have chosen is open so that you can pick up your prescription now.  If there is a problem you may message your provider in [place] to have the prescription routed  to another pharmacy.  Your safety is important to [place]. If you have drug allergies check your prescription carefully.   For the next 24 hours, you can use MyChart to ask questions about today's visit, request a non-urgent call back, or ask for a work or school excuse from your e-visit provider.  You will get an e-mail in the next two days asking about your experience. I hope that your e-visit has been valuable and will speed your recovery.   References or for more  information: ThenWeb.com.ee https://my.ResearchRoots.be https://www.uptodate.com

## 2018-05-20 ENCOUNTER — Encounter: Payer: Self-pay | Admitting: Physical Therapy

## 2018-05-20 ENCOUNTER — Ambulatory Visit: Payer: 59 | Admitting: Physical Therapy

## 2018-05-20 DIAGNOSIS — M6281 Muscle weakness (generalized): Secondary | ICD-10-CM

## 2018-05-20 DIAGNOSIS — R293 Abnormal posture: Secondary | ICD-10-CM

## 2018-05-20 DIAGNOSIS — M25511 Pain in right shoulder: Secondary | ICD-10-CM | POA: Diagnosis not present

## 2018-05-20 NOTE — Therapy (Signed)
[place], [place], [address] Phone: [phone]   Fax:  [phone]  Physical Therapy Treatment  Patient Details  Name: Kathy Clark MRN: [id number] Date of Birth: 08/29/1960 Referring Provider (PT): Silverio Decamp, MD   Encounter Date: 05/20/2018  PT End of Session - 05/20/18 1627    Visit Number  2    Number of Visits  6    Date for PT Re-Evaluation  06/22/18    Authorization Type  [place]    PT Start Time  0277   pt arrived late   PT Stop Time  1617    PT Time Calculation (min)  35 min    Activity Tolerance  Patient tolerated treatment well    Behavior During Therapy  [facility] for tasks assessed/performed       Past Medical History:  Diagnosis Date  . Anxiety   . Back pain   . 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 Depression   . Essential hypertension, benign   . Gastritis   . GERD (gastroesophageal reflux disease)   . Hypertension   . Joint pain   . Lymphocytic colitis    microscopic  . Migraines   . Osteoarthritis   . Sciatica    right leg  . Swelling    feet, left foot    Past Surgical History:  Procedure Laterality Date  . ABDOMINAL HYSTERECTOMY  07-2005  . [place] Bunionectomy Left 09/18/2016   Left Foot  . BACK SURGERY    . KNEE SURGERY  2006   left; multiple  . microdiscetomy  2005  . SHOULDER SURGERY  07-22-09   right  . SKIN CANCER EXCISION     eyelid  . TOTAL KNEE ARTHROPLASTY  07/27/2011   Procedure: TOTAL KNEE ARTHROPLASTY;  Surgeon: Gearlean Alf;  Location: [facility];  Service: Orthopedics;  Laterality: Left;    There were no vitals filed for this visit.  Subjective Assessment - 05/20/18 1543    Subjective  shoulder is more painful today after traveling last weekend and pulling luggage through airport.    Pertinent History  Rt RTC repair ("many years ago")    Limitations  Lifting    Patient Stated Goals  improve pain,  be able to decrease risk of reinjury    Currently in Pain?  No/denies    Pain Score  0-No pain    Pain Onset  More than a month ago         [facility] PT Assessment - 05/20/18 1548      Assessment   Medical Diagnosis  M75.51 (ICD-10-CM) - Subacromial bursitis of right shoulder joint    Referring Provider (PT)  Silverio Decamp, MD    Next MD Visit  2 weeks                   [facility] Adult PT Treatment/Exercise - 05/20/18 1543      Exercises   Exercises  Shoulder      Shoulder Exercises: Standing   External Rotation  Right;20 reps;Theraband    Internal Rotation  Right;20 reps;Theraband    Theraband Level (Shoulder Internal Rotation)  Level 3 (Green)    Row  20 reps;Theraband    Theraband Level (Shoulder Row)  Level 3 (Green)      Shoulder Exercises: ROM/Strengthening   UBE (Upper Arm Bike)  L4 x 4 min (2' fwd/2'bwd)      Shoulder Exercises:  Stretch   Other Shoulder Stretches  doorway stretch 3x30 sec      Manual Therapy   Manual Therapy  Soft tissue mobilization    Manual therapy comments  pt supine    Soft tissue mobilization  Rt upper trap into cervical paraspinals       Trigger Point Dry Needling - 05/20/18 1626    Consent Given?  Yes    Muscles Treated Upper Body  Upper trapezius    Upper Trapezius Response  Twitch reponse elicited;Palpable increased muscle length                PT Long Term Goals - 05/11/18 1249      PT LONG TERM GOAL #1   Title  independent with HEP    Status  New    Target Date  06/22/18      PT LONG TERM GOAL #2   Title  perform Rt shoulder ROM without pain for improved function    Status  New    Target Date  06/22/18      PT LONG TERM GOAL #3   Title  demonstrate 4+/5 for improved function and mobility    Status  New    Target Date  06/22/18      PT LONG TERM GOAL #4   Title  verbalize understanding of lifting and carrying techniques to decrease risk of reinjury    Status  New    Target Date  06/22/18       PT LONG TERM GOAL #5   Title  FOTO score improved to </= 30% limited    Status  New    Target Date  06/22/18            Plan - 05/20/18 1627    Clinical Impression Statement  Pt reports elevated periods of pain due to increased overhead activity with recent travel (lifting luggage into overhead bins) but pain improved today.  Tolerated DN and manual therapy well today with decreased pain and tightness following.  Declined modalities today.    Rehab Potential  Good    PT Frequency  1x / week    PT Duration  6 weeks    PT Treatment/Interventions  ADLs/Self Care Home Management;Cryotherapy;Electrical Stimulation;Ultrasound;Iontophoresis 4mg /ml Dexamethasone;Functional mobility training;Therapeutic activities;Therapeutic exercise;Patient/family education;Manual techniques;Taping;Dry needling;Passive range of motion;Vasopneumatic Device    PT Next Visit Plan  review HEP and progress, DN/manual/modalities PRN; assess response to DN    PT Home Exercise Plan  Access Code: [email or web address]    Consulted and Agree with Plan of Care  Patient       Patient will benefit from skilled therapeutic intervention in order to improve the following deficits and impairments:  Increased fascial restricitons, Increased muscle spasms, Pain, Postural dysfunction, Decreased strength, Decreased range of motion, Impaired UE functional use  Visit Diagnosis: Acute pain of right shoulder  Abnormal posture  Muscle weakness (generalized)     Problem List Patient Active Problem List   Diagnosis Date Noted  . Metatarsalgia of left foot 01/05/2018  . Other hyperlipidemia 03/23/2017  . OSA (obstructive sleep apnea) 02/25/2017  . Class 1 obesity without serious comorbidity with body mass index (BMI) of 34.0 to 34.9 in adult 08/17/2016  . Elevated liver function tests 08/17/2016  . Vitamin D deficiency 08/03/2016  . Left tennis elbow 04/06/2016  . 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 Depression 08/23/2015  .  Subacromial bursitis 04/23/2015  . Bilateral carpal tunnel syndrome 12/24/2014  . Trochanteric bursitis of right hip 04/21/2013  .  Postop Mild Hyponatremia 07/30/2011  . OSTEOARTHRITIS, KNEE, LEFT 12/12/2009  . MIGRAINE WITH AURA 05/31/2008  . SKIN CANCER, HX OF 01/06/2008  . COLITIS, HX OF 01/06/2008  . ALLERGIC RHINITIS 05/20/2007  . HYPERTENSION, BENIGN ESSENTIAL 11/23/2006  . SCIATICA, CHRONIC 05/19/2006      Laureen Abrahams, PT, DPT 05/20/18 4:30 PM     [facility] [place], [place], [address] Phone: [phone]   Fax:  [phone]  Name: HERMENA SWINT MRN: [id number] Date of Birth: September 15, 1960

## 2018-05-21 IMAGING — DX DG WRIST COMPLETE 3+V*L*
4 series · 4 of 4 positions shown · non-contrast
Comparison: None.

CLINICAL DATA: Left wrist pain for 5 months.

EXAM:
LEFT WRIST - COMPLETE 3+ VIEW

[wrist pa]
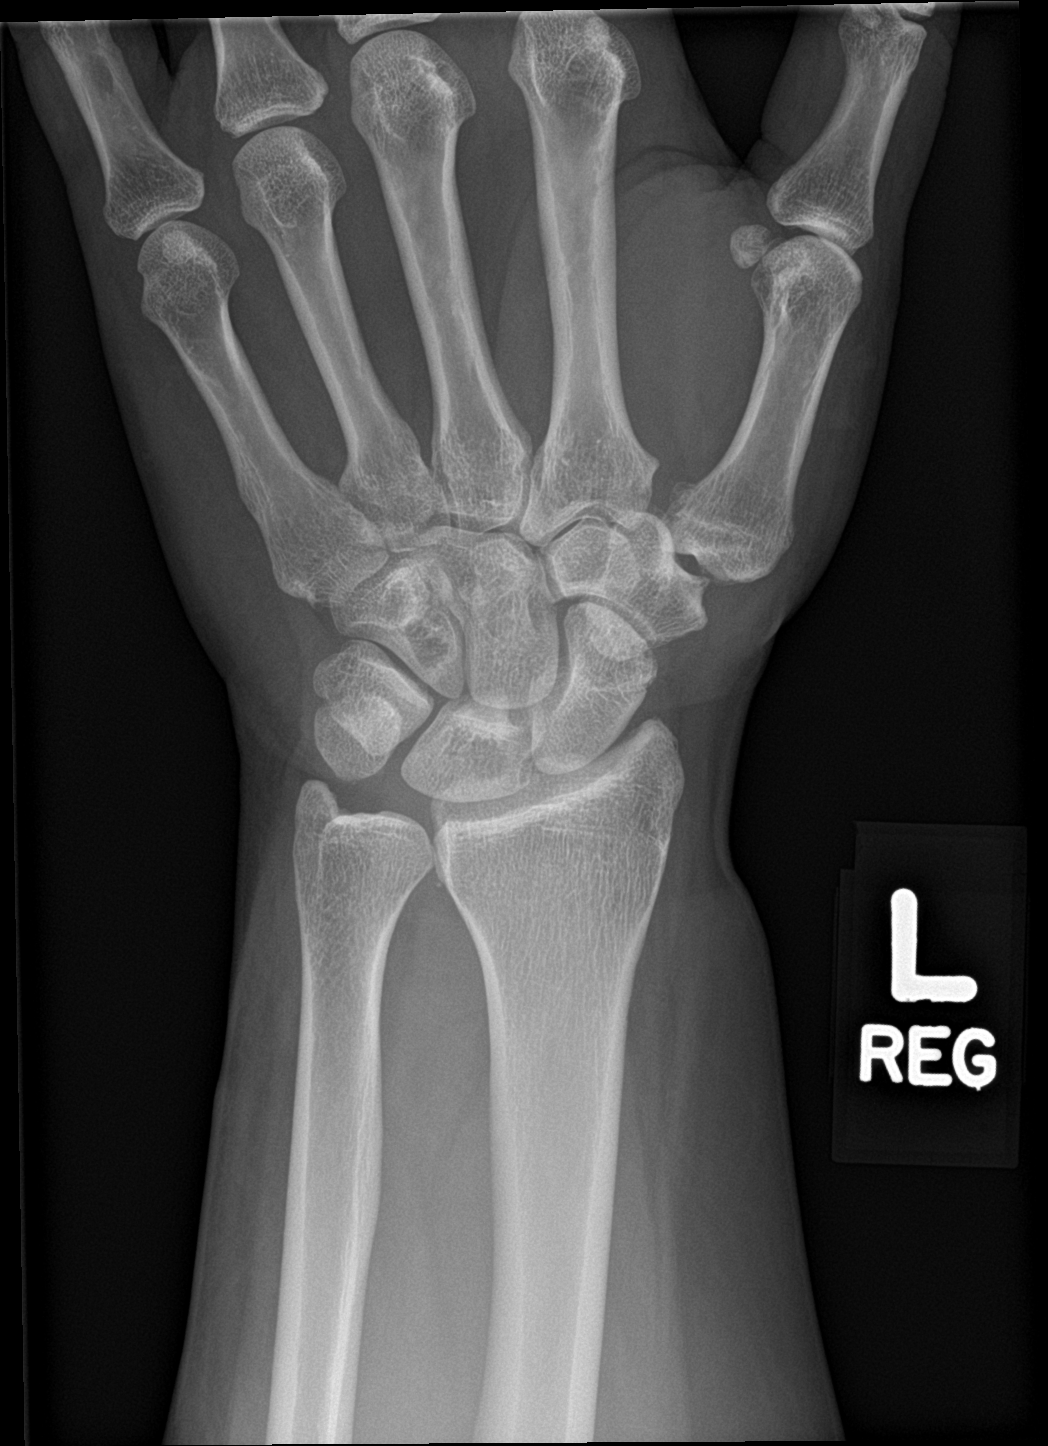

[wrist obl]
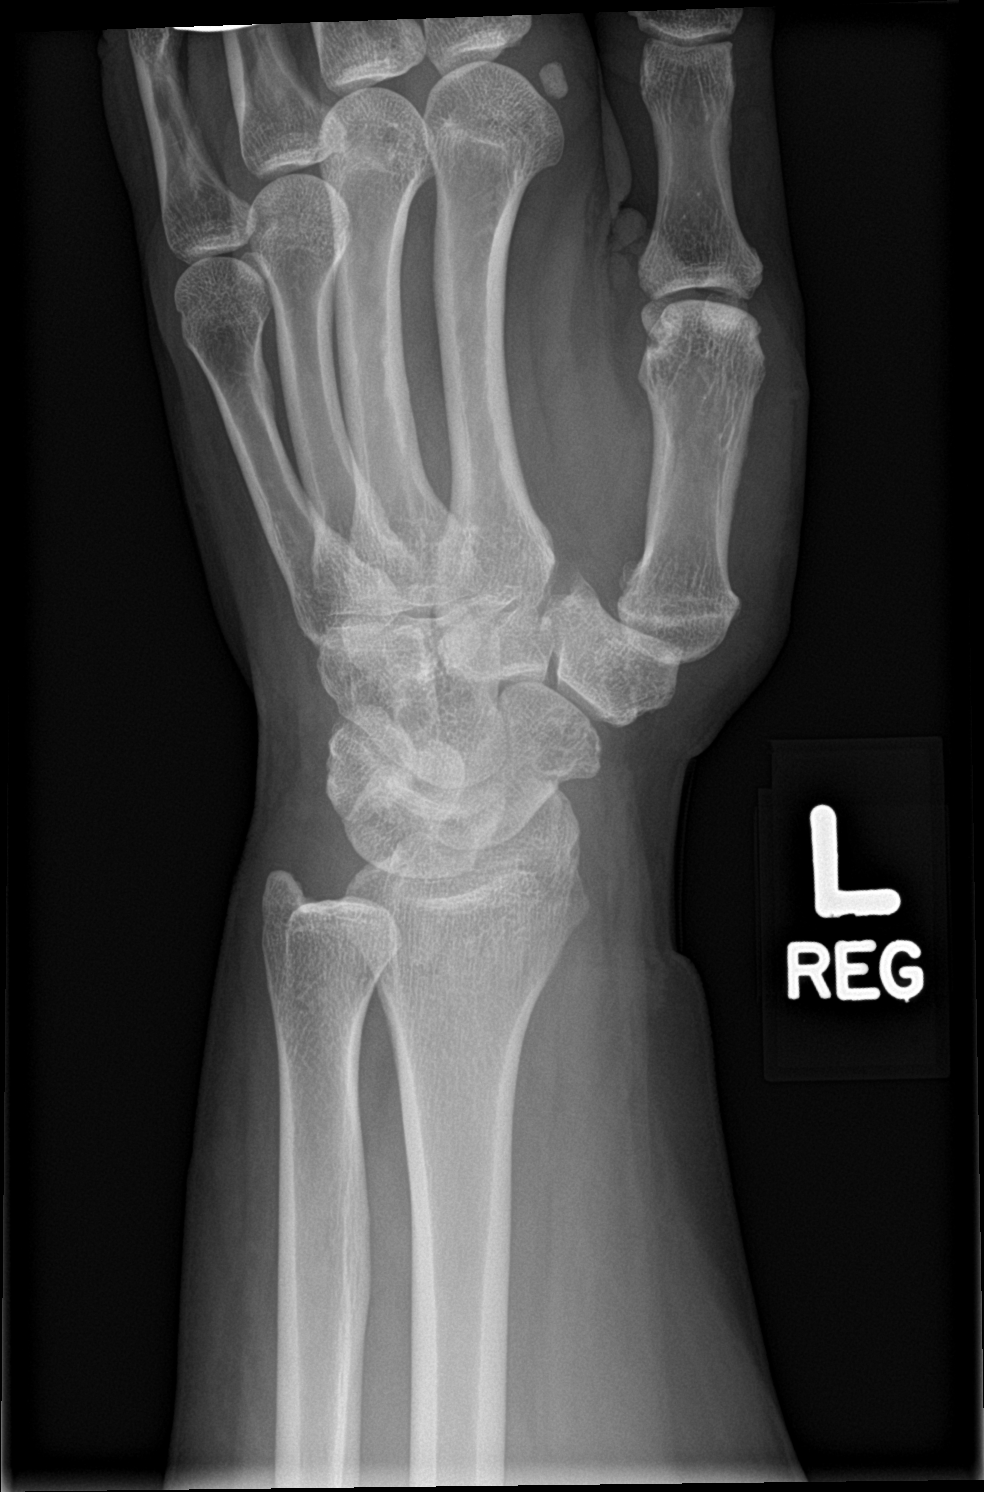

[wrist lat]
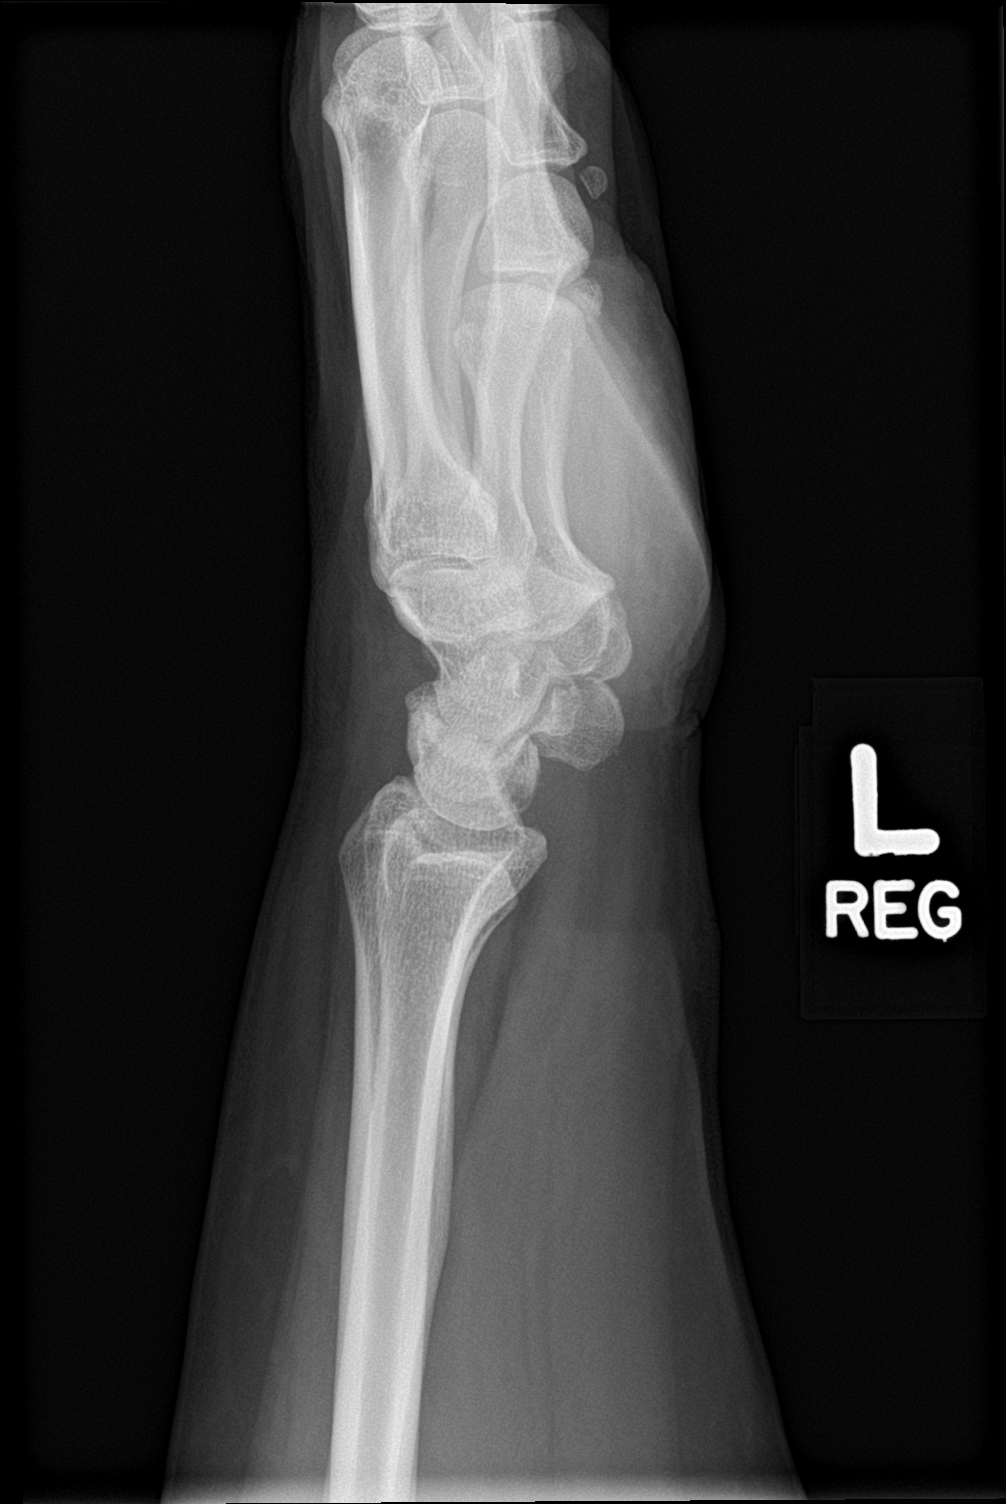

[wrist navicular]
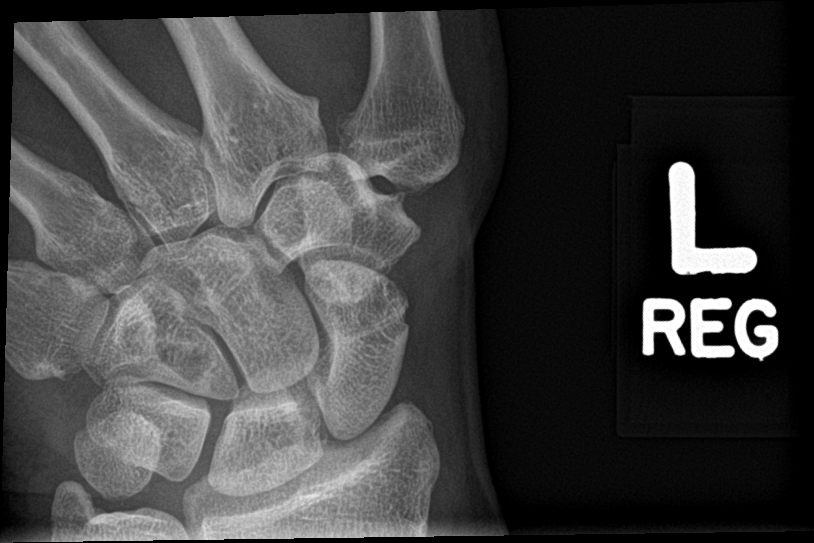

[4 of 4 positions shown; findings below may reference images not displayed]

FINDINGS: There is no evidence of fracture or dislocation.

There is no evidence of arthropathy or suspicious focal bone
abnormality.

Soft tissues are unremarkable.
IMPRESSION: No acute or significant abnormality.

## 2018-05-24 ENCOUNTER — Ambulatory Visit (INDEPENDENT_AMBULATORY_CARE_PROVIDER_SITE_OTHER): Payer: 59 | Admitting: Family Medicine

## 2018-05-24 VITALS — BP 129/84 | HR 71 | Temp 98.0°F | Ht 68.0 in | Wt 215.0 lb

## 2018-05-24 DIAGNOSIS — F3289 Other specified depressive episodes: Secondary | ICD-10-CM | POA: Diagnosis not present

## 2018-05-24 DIAGNOSIS — I10 Essential (primary) hypertension: Secondary | ICD-10-CM

## 2018-05-24 DIAGNOSIS — Z9189 Other specified personal risk factors, not elsewhere classified: Secondary | ICD-10-CM

## 2018-05-24 DIAGNOSIS — E669 Obesity, unspecified: Secondary | ICD-10-CM | POA: Diagnosis not present

## 2018-05-24 DIAGNOSIS — Z6832 Body mass index (BMI) 32.0-32.9, adult: Secondary | ICD-10-CM | POA: Diagnosis not present

## 2018-05-24 DIAGNOSIS — E559 Vitamin D deficiency, unspecified: Secondary | ICD-10-CM | POA: Diagnosis not present

## 2018-05-24 MED ORDER — BUPROPION HCL ER (SR) 150 MG PO TB12: 150.0000 mg | ORAL_TABLET | Freq: Every day | ORAL | 0 refills | Status: DC

## 2018-05-25 ENCOUNTER — Encounter: Payer: 59 | Admitting: Physical Therapy

## 2018-05-25 NOTE — Progress Notes (Signed)
Office: [phone]  /  Fax: [phone]   HPI:   Chief Complaint: OBESITY Kathy Clark is here to discuss her progress with her obesity treatment plan. She is keeping a food journal with 1400 calories and 80 grams of protein and is following her eating plan approximately 50 % of the time. She states she is walking 30 minutes 7 times per week. Kathy Clark just went out of town for a week. She went out to eat and enjoyed time with a friend. She didn't track while visiting.  Her weight is 215 lb (97.5 kg) today and has had a weight gain of 3 pounds over a period of 3 weeks since her last visit. She has lost 22 lbs since starting treatment with [place].  Depression with emotional eating behaviors Kathy Clark is struggling with emotional eating and using food for comfort to the extent that it is negatively impacting her health. She often snacks when she is not hungry. Kathy Clark sometimes feels she is out of control and then feels guilty that she made poor food choices. Her blood pressure is controlled. She shows no sign of suicidal or homicidal ideations.  Hypertension Kathy Clark is a 57 y.o. female with hypertension. She is working on weight loss to help control her blood pressure with the goal of decreasing her risk of heart attack and stroke. Kathy Clark's blood pressure is currently controlled today. Kathy Clark denies chest pain, chest pressure, or headaches.  Vitamin D deficiency Kathy Clark has a diagnosis of vitamin D deficiency. She is not consistent in taking her vit D supplement and denies nausea, vomiting, or muscle weakness.  At risk for osteopenia and osteoporosis Kathy Clark is at higher risk of osteopenia and osteoporosis due to vitamin D deficiency.   ALLERGIES: Allergies  Allergen Reactions  . Succinylcholine Anaphylaxis  . Dilaudid [Hydromorphone Hcl] Nausea And Vomiting    MEDICATIONS: Current Outpatient Medications on File Prior to Visit  Medication Sig Dispense Refill  . AMBULATORY NON FORMULARY  MEDICATION Medication Name: Please decrease CPAP setting to 8 cm of water pressure and fax [place] a download after 5 days.  Is having a lot of difficulty with air leaks is working to try to reduce her pressure to see if she is still being adequately treated but with fewer leaks.FAx to [place] 1 Units 0  . azelastine (ASTELIN) 0.1 % nasal spray Place 2 sprays into both nostrils 2 (two) times daily. Use in each nostril as directed 30 mL 12  . clonazePAM (KLONOPIN) 0.25 MG disintegrating tablet Take 1 tablet (0.25 mg total) by mouth daily as needed. 20 tablet 0  . cyclobenzaprine (FLEXERIL) 10 MG tablet Take 1 tablet (10 mg total) by mouth 3 (three) times daily as needed for muscle spasms. 30 tablet 0  . diclofenac sodium (VOLTAREN) 1 % GEL Apply 2 g topically 4 (four) times daily. To affected joint. 100 g 11  . estradiol (VIVELLE-DOT) 0.075 MG/24HR   3  . famotidine (PEPCID) 20 MG tablet Take 30 minutes before lunch and 30 minutes before dinner as needed for heartburn. 30 tablet 0  . fluticasone (FLONASE) 50 MCG/ACT nasal spray Place 2 sprays into both nostrils daily. 16 g 0  . gabapentin (NEURONTIN) 300 MG capsule Take 300 mg by mouth 2 (two) times daily.     . hydrochlorothiazide (MICROZIDE) 12.5 MG capsule Take 1 capsule (12.5 mg total) by mouth daily. 90 capsule 3  . hyoscyamine (LEVBID) 0.375 MG 12 hr tablet Take 1 tablet (0.375 mg total) by  mouth 2 (two) times daily. 60 tablet 0  . losartan (COZAAR) 25 MG tablet Take 1 tablet (25 mg total) by mouth daily. (Patient taking differently: Take 25 mg by mouth daily. Take 1/2 tab daily) 90 tablet 3  . meclizine (ANTIVERT) 12.5 MG tablet Take 1-2 tablets (12.5-25 mg total) by mouth every 8 (eight) hours as needed for dizziness. 30 tablet 0  . Melatonin 3 MG TABS Take by mouth as directed.    . meloxicam (MOBIC) 15 MG tablet TAKE 1 TABLET BY MOUTH DAILY AS NEEDED FOR PAIN. 14 tablet 0  . Multiple Vitamins-Minerals (MULTIVITAMIN ADULT PO) Take by  mouth daily.    . naproxen (NAPROSYN) 500 MG tablet Take 1 tablet (500 mg total) by mouth 2 (two) times daily with a meal. 30 tablet 0  . ondansetron (ZOFRAN ODT) 4 MG disintegrating tablet Take 1 tablet (4 mg total) by mouth every 8 (eight) hours as needed for nausea or vomiting. 30 tablet 0  . pantoprazole (PROTONIX) 40 MG tablet Take 1 tablet (40 mg total) by mouth daily. 30 tablet 3  . promethazine (PHENERGAN) 25 MG tablet Take 1 tablet (25 mg total) by mouth every 8 (eight) hours as needed for nausea or vomiting. 20 tablet 0  . pyridOXINE (VITAMIN B-6) 50 MG tablet Take 1 tablet (50 mg total) by mouth daily. 90 tablet 3  . SUMAtriptan (IMITREX) 20 MG/ACT nasal spray PLACE 1 SPRAY INTO THE NOSE EVERY 2 HOURS AS NEEDED FOR. HEADACHE 6 Inhaler 2  . Vilazodone HCl (VIIBRYD) 40 MG TABS Take 1 tablet (40 mg total) by mouth daily. 30 tablet 1  . Vitamin D, Ergocalciferol, (DRISDOL) 50000 units CAPS capsule Take 1 capsule (50,000 Units total) by mouth every 7 (seven) days. 4 capsule 0  . zolpidem (AMBIEN) 10 MG tablet Take 1 tablet (10 mg total) by mouth at bedtime as needed for sleep. 30 tablet 2   No current facility-administered medications on file prior to visit.     PAST MEDICAL HISTORY: Past Medical History:  Diagnosis Date  . Anxiety   . Back pain   . 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 Depression   . Essential hypertension, benign   . Gastritis   . GERD (gastroesophageal reflux disease)   . Hypertension   . Joint pain   . Lymphocytic colitis    microscopic  . Migraines   . Osteoarthritis   . Sciatica    right leg  . Swelling    feet, left foot    PAST SURGICAL HISTORY: Past Surgical History:  Procedure Laterality Date  . ABDOMINAL HYSTERECTOMY  07-2005  . [place] Bunionectomy Left 09/18/2016   Left Foot  . BACK SURGERY    . KNEE SURGERY  2006   left; multiple  . microdiscetomy  2005  . SHOULDER SURGERY  07-22-09   right  . SKIN CANCER  EXCISION     eyelid  . TOTAL KNEE ARTHROPLASTY  07/27/2011   Procedure: TOTAL KNEE ARTHROPLASTY;  Surgeon: Gearlean Alf;  Location: [facility];  Service: Orthopedics;  Laterality: Left;    SOCIAL HISTORY: Social History   Tobacco Use  . Smoking status: Never Smoker  . Smokeless tobacco: Never Used  Substance Use Topics  . Alcohol use: Yes    Comment: occasionally  . Drug use: No    FAMILY HISTORY: Family History  Problem Relation Age of Onset  . Breast cancer Unknown   . Tongue cancer Mother   .  Breast cancer Mother   . Anxiety disorder Mother   . Depression Mother   . Heart disease Father   . Hypertension Father   . Hyperlipidemia Father   . Stroke Paternal Grandmother   . Colon cancer Neg Hx     ROS: Review of Systems  Constitutional: Negative for weight loss.  Cardiovascular: Negative for chest pain.       Negative for chest pressure.  Gastrointestinal: Negative for nausea and vomiting.  Musculoskeletal:       Negative for muscle weakness.  Neurological: Negative for headaches.  Psychiatric/Behavioral: Positive for depression. Negative for suicidal ideas.       Negative for homicidal ideations.    PHYSICAL EXAM: Blood pressure 129/84, pulse 71, temperature 98 F (36.7 C), temperature source Oral, height 5\' 8"  (1.727 m), weight 215 lb (97.5 kg), SpO2 98 %. Body mass index is 32.69 kg/m. Physical Exam  Constitutional: She is oriented to person, place, and time. She appears well-developed and well-nourished.  Cardiovascular: Normal rate.  Pulmonary/Chest: Effort normal.  Musculoskeletal: Normal range of motion.  Neurological: She is oriented to person, place, and time.  Skin: Skin is warm and dry.  Psychiatric: She has a normal mood and affect. Her behavior is normal.  Vitals reviewed.   RECENT LABS AND TESTS: BMET    Component Value Date/Time   NA 142 08/18/2017 0940   K 4.0 08/18/2017 0940   CL 101 08/18/2017 0940   CO2 25 08/18/2017 0940    GLUCOSE 89 08/18/2017 0940   GLUCOSE 79 03/26/2016 1600   BUN 12 08/18/2017 0940   CREATININE 0.70 08/18/2017 0940   CREATININE 0.61 03/26/2016 1600   CALCIUM 9.4 08/18/2017 0940   GFRNONAA 96 08/18/2017 0940   GFRNONAA >89 03/26/2016 1600   GFRAA 111 08/18/2017 0940   GFRAA >89 03/26/2016 1600   Lab Results  Component Value Date   HGBA1C 5.1 12/01/2016   HGBA1C 4.9 08/03/2016   Lab Results  Component Value Date   INSULIN 5.4 12/01/2016   INSULIN 5.9 08/03/2016   CBC    Component Value Date/Time   WBC WILL FOLLOW 08/18/2017 0940   WBC 6.7 03/26/2016 1600   RBC WILL FOLLOW 08/18/2017 0940   RBC 4.68 03/26/2016 1600   HGB WILL FOLLOW 08/18/2017 0940   HCT WILL FOLLOW 08/18/2017 0940   PLT WILL FOLLOW 08/18/2017 0940   MCV WILL FOLLOW 08/18/2017 0940   MCH WILL FOLLOW 08/18/2017 0940   MCH 30.6 03/26/2016 1600   MCHC WILL FOLLOW 08/18/2017 0940   MCHC 33.6 03/26/2016 1600   RDW WILL FOLLOW 08/18/2017 0940   LYMPHSABS WILL FOLLOW 08/18/2017 0940   MONOABS 469 03/26/2016 1600   EOSABS WILL FOLLOW 08/18/2017 0940   BASOSABS WILL FOLLOW 08/18/2017 0940   Iron/TIBC/Ferritin/ %Sat    Component Value Date/Time   FERRITIN 142 12/25/2015 0912   Lipid Panel     Component Value Date/Time   CHOL 196 05/02/2018 0943   TRIG 74 05/02/2018 0943   HDL 61 05/02/2018 0943   CHOLHDL 3.0 04/05/2015 0937   VLDL 21 04/05/2015 0937   LDLCALC 120 (H) 05/02/2018 0943   Hepatic Function Panel     Component Value Date/Time   PROT 6.9 08/18/2017 0940   ALBUMIN 4.4 08/18/2017 0940   AST 16 08/18/2017 0940   ALT 22 08/18/2017 0940   ALKPHOS 47 08/18/2017 0940   BILITOT 0.9 08/18/2017 0940   BILIDIR 0.1 12/25/2015 0912   IBILI 0.5 12/25/2015 0912  Component Value Date/Time   TSH 1.470 08/03/2016 1458   TSH 2.09 03/26/2016 1600   TSH 3.10 12/25/2015 0912   Results for NALINA, YEATMAN (MRN [id number]) as of 05/25/2018 11:14  Ref. Range 05/02/2018 09:43  Vitamin D,  25-Hydroxy Latest Ref Range: 30.0 - 100.0 ng/mL 45.6   ASSESSMENT AND PLAN: Essential hypertension  Vitamin D deficiency  Other depression - with emotional eating - Plan: buPROPion (WELLBUTRIN SR) 150 MG 12 hr tablet  At risk for osteoporosis  Class 1 obesity with serious comorbidity and body mass index (BMI) of 32.0 to 32.9 in adult, unspecified obesity type  PLAN:  Hypertension We discussed sodium restriction, working on healthy weight loss, and a regular exercise program as the means to achieve improved blood pressure control. We will continue to monitor her blood pressure as well as her progress with the above lifestyle modifications. She will continue her medications as prescribed and will watch for signs of hypotension as she continues her lifestyle modifications. Kathy Clark agreed with this plan and agreed to follow up as directed in 3 weeks.  Vitamin D Deficiency Kathy Clark was informed that low vitamin D levels contributes to fatigue and are associated with obesity, breast, and colon cancer. She agrees to continue to take her vitamin D supplement and will follow up for routine testing of vitamin D, at least 2-3 times per year. She was informed of the risk of over-replacement of vitamin D and agrees to not increase her dose unless she discusses this with [place] first. She will follow up as directed.  At risk for osteopenia and osteoporosis Kathy Clark was given extended (15 minutes) osteoporosis prevention counseling today. Kathy Clark is at risk for osteopenia and osteoporosis due to her vitamin D deficiency. She was encouraged to take her vitamin D and follow her higher calcium diet and increase strengthening exercise to help strengthen her bones and decrease her risk of osteopenia and osteoporosis.  Depression with Emotional Eating Behaviors We discussed behavior modification techniques today to help Kathy Clark deal with her emotional eating and depression. She has agreed to continue to take Wellbutrin SR 150mg   qd with no refills and agreed to follow up as directed in 3 weeks.  Obesity Kathy Clark is currently in the action stage of change. As such, her goal is to continue with weight loss efforts. She has agreed to keep a food journal with 1400 calories and 80+ grams of protein daily. Kathy Clark has been instructed to work up to a goal of 150 minutes of combined cardio and strengthening exercise per week for weight loss and overall health benefits. We discussed the following Behavioral Modification Strategies today: increasing lean protein intake, increasing vegetables, work on meal planning and easy cooking plans, and planning for success.  Kathy Clark has agreed to follow up with our clinic in 3 weeks. She was informed of the importance of frequent follow up visits to maximize her success with intensive lifestyle modifications for her multiple health conditions.   OBESITY BEHAVIORAL INTERVENTION VISIT  Today's visit was # 76   Starting weight: 237 lbs Starting date: 08/03/16 Today's weight : Weight: 215 lb (97.5 kg)  Today's date: 05/24/2018 Total lbs lost to date: 34  ASK: We discussed the diagnosis of obesity with Kathy Clark today and Kathy Clark agreed to give [place] permission to discuss obesity behavioral modification therapy today.  ASSESS: Kathy Clark has the diagnosis of obesity and her BMI today is 32.7. Kathy Clark is in the action stage of change.   ADVISE: Kathy Clark was educated  on the multiple health risks of obesity as well as the benefit of weight loss to improve her health. She was advised of the need for long term treatment and the importance of lifestyle modifications to improve her current health and to decrease her risk of future health problems.  AGREE: Multiple dietary modification options and treatment options were discussed and Kathy Clark agreed to follow the recommendations documented in the above note.  ARRANGE: Kathy Clark was educated on the importance of frequent visits to treat obesity as outlined per  CMS and USPSTF guidelines and agreed to schedule her next follow up appointment today.  I, Marcille Blanco, am acting as Location manager for Eber Jones, MD  I have reviewed the above documentation for accuracy and completeness, and I agree with the above. - Ilene Qua, MD

## 2018-05-26 ENCOUNTER — Ambulatory Visit: Payer: 59 | Admitting: Family Medicine

## 2018-05-27 ENCOUNTER — Telehealth: Payer: 59 | Admitting: Family

## 2018-05-27 DIAGNOSIS — B369 Superficial mycosis, unspecified: Secondary | ICD-10-CM

## 2018-05-27 MED ORDER — KETOCONAZOLE 2 % EX CREA: 1.0000 "application " | TOPICAL_CREAM | Freq: Every day | CUTANEOUS | 1 refills | Status: DC

## 2018-05-27 MED FILL — KETOCONAZOLE 2% CREAM: 2 | 20 days supply | Qty: 30 | Fill #0

## 2018-05-27 NOTE — Progress Notes (Signed)
Thank you for the details you included in the comment boxes. Those details are very helpful in determining the best course of treatment for you and help [place] to provide the best care. This may be fungal. Often, fungal rashes don't respond very well to corticosteroid creams. It is the preferred treatment, but in the real-world, many patients complain that they do not help. See plan below. When viral, it often responds very well to hydrocortisone.   E Visit for Rash  We are sorry that you are not feeling well. Here is how we plan to help!   Based upon your presentation it appears you have a fungal infection.  I have prescribed: and Nystatin cream apply to the affected area twice daily   HOME CARE:   Take cool showers and avoid direct sunlight.  Apply cool compress or wet dressings.  Take a bath in an oatmeal bath.  Sprinkle content of one Aveeno packet under running faucet with comfortably warm water.  Bathe for 15-20 minutes, 1-2 times daily.  Pat dry with a towel. Do not rub the rash.  Use hydrocortisone cream.  Take an antihistamine like Benadryl for widespread rashes that itch.  The adult dose of Benadryl is 25-50 mg by mouth 4 times daily.  Caution:  This type of medication may cause sleepiness.  Do not drink alcohol, drive, or operate dangerous machinery while taking antihistamines.  Do not take these medications if you have prostate enlargement.  Read package instructions thoroughly on all medications that you take.  GET HELP RIGHT AWAY IF:   Symptoms don't go away after treatment.  Severe itching that persists.  If you rash spreads or swells.  If you rash begins to smell.  If it blisters and opens or develops a yellow-brown crust.  You develop a fever.  You have a sore throat.  You become short of breath.  MAKE SURE YOU:  Understand these instructions. Will watch your condition. Will get help right away if you are not doing well or get worse.  Thank you for  choosing an e-visit. Your e-visit answers were reviewed by a board certified advanced clinical practitioner to complete your personal care plan. Depending upon the condition, your plan could have included both over the counter or prescription medications. Please review your pharmacy choice. Be sure that the pharmacy you have chosen is open so that you can pick up your prescription now.  If there is a problem you may message your provider in [place] to have the prescription routed to another pharmacy. Your safety is important to [place]. If you have drug allergies check your prescription carefully.  For the next 24 hours, you can use MyChart to ask questions about today's visit, request a non-urgent call back, or ask for a work or school excuse from your e-visit provider. You will get an email in the next two days asking about your experience. I hope that your e-visit has been valuable and will speed your recovery.

## 2018-05-30 ENCOUNTER — Ambulatory Visit (INDEPENDENT_AMBULATORY_CARE_PROVIDER_SITE_OTHER): Payer: 59 | Admitting: Family Medicine

## 2018-05-30 ENCOUNTER — Encounter: Payer: Self-pay | Admitting: Family Medicine

## 2018-05-30 VITALS — BP 128/74 | HR 66 | Ht 67.32 in | Wt 218.0 lb

## 2018-05-30 DIAGNOSIS — F3289 Other specified depressive episodes: Secondary | ICD-10-CM

## 2018-05-30 DIAGNOSIS — F5101 Primary insomnia: Secondary | ICD-10-CM

## 2018-05-30 DIAGNOSIS — I10 Essential (primary) hypertension: Secondary | ICD-10-CM

## 2018-05-30 MED ORDER — VILAZODONE HCL 40 MG PO TABS: 40.0000 mg | ORAL_TABLET | Freq: Every day | ORAL | 1 refills | Status: DC

## 2018-05-30 MED ORDER — CLOTRIMAZOLE-BETAMETHASONE 1-0.05 % EX CREA: 1.0000 "application " | TOPICAL_CREAM | Freq: Every evening | CUTANEOUS | 0 refills | Status: DC | PRN

## 2018-05-30 MED FILL — CLOTRIMAZOLE-BETAMETHASONE: 1-0.05 | 7 days supply | Qty: 15 | Fill #0

## 2018-05-30 MED FILL — BUPROPION SR 150 MG TABLET: 150 | 30 days supply | Qty: 30 | Fill #0

## 2018-05-30 MED FILL — VIT D2 1.25 MG (50,000 UNIT: 1.25 MG | 28 days supply | Qty: 4 | Fill #0

## 2018-05-30 MED FILL — VIIBRYD 40 MG TABLET: 40 | 90 days supply | Qty: 90 | Fill #0

## 2018-05-30 NOTE — Progress Notes (Signed)
Subjective:    CC:   HPI:  F/u insomnia - Uses AMbien PRN.    Hypertension- Pt denies chest pain, SOB, dizziness, or heart palpitations.  Taking meds as directed w/o problems.  Denies medication side effects.    F/U Mood - she is currently on Viibryd and now WEllbutrin.    Noticed a rash on the right side of her face. She started with OTC cortizone cream.  Then started ketoconazole cream on Friday given during and e-visit.    Past medical history, Surgical history, Family history not pertinant except as noted below, Social history, Allergies, and medications have been entered into the medical record, reviewed, and corrections made.   Review of Systems: No fevers, chills, night sweats, weight loss, chest pain, or shortness of breath.   Objective:    General: Well Developed, well nourished, and in no acute distress.  Neuro: Alert and oriented x3, extra-ocular muscles intact, sensation grossly intact.  HEENT: Normocephalic, atraumatic  Skin: Warm and dry, no rashes. She has an erythematouas almost linear rash on there right facial cheek.  There is papule lateral to the nose.    Cardiac: Regular rate and rhythm, no murmurs rubs or gallops, no lower extremity edema.  Respiratory: Clear to auscultation bilaterally. Not using accessory muscles, speaking in full sentences.   Impression and Recommendations:    HTN - Well controlled. Continue current regimen. Follow up in   Primary insomnia - just  Uses Ambien PRN>   Rash -  Will try lotrisone. Looks like a dermatitis. She will try changing her CPAP mask too.  She has a new kit athome.

## 2018-06-01 ENCOUNTER — Ambulatory Visit (INDEPENDENT_AMBULATORY_CARE_PROVIDER_SITE_OTHER): Payer: 59 | Admitting: Physical Therapy

## 2018-06-01 ENCOUNTER — Encounter: Payer: Self-pay | Admitting: Physical Therapy

## 2018-06-01 DIAGNOSIS — R293 Abnormal posture: Secondary | ICD-10-CM

## 2018-06-01 DIAGNOSIS — M6281 Muscle weakness (generalized): Secondary | ICD-10-CM

## 2018-06-01 DIAGNOSIS — M25511 Pain in right shoulder: Secondary | ICD-10-CM | POA: Diagnosis not present

## 2018-06-01 NOTE — Patient Instructions (Signed)

## 2018-06-01 NOTE — Therapy (Addendum)
[facility] [place], [place], [address] Phone: [phone]   Fax:  [phone]  Physical Therapy Treatment/Discharge  Patient Details  Name: Kathy Clark MRN: [id number] Date of Birth: [birth year] Referring Provider (PT): Silverio Decamp, MD   Encounter Date: 06/01/2018  PT End of Session - 06/01/18 0719    Visit Number  3    Number of Visits  6    Date for PT Re-Evaluation  06/22/18    Authorization Type  [place]    PT Start Time  [phone]    PT Stop Time  0800    PT Time Calculation (min)  42 min       Past Medical History:  Diagnosis Date  . Anxiety   . Back pain   . 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 Depression   . Essential hypertension, benign   . Gastritis   . GERD (gastroesophageal reflux disease)   . Hypertension   . Joint pain   . Lymphocytic colitis    microscopic  . Migraines   . Osteoarthritis   . Sciatica    right leg  . Swelling    feet, left foot    Past Surgical History:  Procedure Laterality Date  . ABDOMINAL HYSTERECTOMY  07-2005  . [place] Bunionectomy Left 09/18/2016   Left Foot  . BACK SURGERY    . KNEE SURGERY  2006   left; multiple  . microdiscetomy  2005  . SHOULDER SURGERY  07-22-09   right  . SKIN CANCER EXCISION     eyelid  . TOTAL KNEE ARTHROPLASTY  07/27/2011   Procedure: TOTAL KNEE ARTHROPLASTY;  Surgeon: Gearlean Alf;  Location: [facility];  Service: Orthopedics;  Laterality: Left;    There were no vitals filed for this visit.  Subjective Assessment - 06/01/18 0719    Subjective  Pt reports her shoulder is better, with only occasional twinges.  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      Patient Stated Goals  improve pain, be able to decrease risk of reinjury    Currently in Pain?  No/denies    Pain Location  Shoulder    Pain Orientation  Right         [facility] PT Assessment -  06/01/18 0001      Assessment   Medical Diagnosis  M75.51 (ICD-10-CM) - Subacromial bursitis of right shoulder joint    Referring Provider (PT)  Silverio Decamp, MD    Next MD Visit  to be scheduled       Observation/Other Assessments   Focus on Therapeutic Outcomes (FOTO)   35% limited      AROM   Overall AROM Comments  bil shoulders WNL with discomfort and tightness with Rt functional internal rotation      Strength   Right Shoulder Flexion  --   5-/5   Right Shoulder ABduction  --   5-/5   Right Shoulder Internal Rotation  5/5    Right Shoulder External Rotation  --   5-/5   Left Shoulder Flexion  5/5        [facility] Adult PT Treatment/Exercise - 06/01/18 0001      Exercises   Exercises  Shoulder      Shoulder Exercises: Standing   External Rotation  Strengthening;Both;10 reps   2 sets   Theraband Level (Shoulder External Rotation)  Level 2 (Red)  Extension  Both;Strengthening;10 reps;Theraband    Theraband Level (Shoulder Extension)  Level 3 (Green)    Row  Strengthening;10 reps;Theraband    Theraband Level (Shoulder Row)  Level 3 (Green)    Other Standing Exercises  sash with yellow band x 10 reps each shoulder    Other Standing Exercises  Rt bicep curl with green band x 10, Rt shoulder row (stepping on band) x 10 (to simulate picking up music equip)      Shoulder Exercises: ROM/Strengthening   UBE (Upper Arm Bike)  L3: 1.5 min each direction       Shoulder Exercises: Stretch   Internal Rotation Stretch  10 seconds   5 reps, strap assist   Other Shoulder Stretches  3 position- doorway stretch x 20 sec x 3 reps each position, bicep stretch (arms laced behind back) x 10 sec x 3 reps       Manual Therapy   Manual Therapy  Soft tissue mobilization;Taping    Soft tissue mobilization  IASTM to Rt ant shoulder to decrease fascial restriction, improve ROM.      Kinesiotex  IT sales professional  I strip of senstive skin tape applied to  Rt corcoid process to deltoid tuberosity, and perpendicular piece placed across this strip wiht 50% stretch to decompress area, decrease discomfort.              PT Education - 06/01/18 0746    Education Details  access code: [facility], kinesiology tape    Person(s) Educated  Patient    Methods  Explanation;Demonstration;Verbal cues;Handout;Tactile cues    Comprehension  Verbalized understanding;Returned demonstration          PT Long Term Goals - 06/01/18 1409      PT LONG TERM GOAL #1   Title  independent with HEP    Time  6    Period  Weeks    Status  On-going      PT LONG TERM GOAL #2   Title  perform Rt shoulder ROM without pain for improved function    Time  6    Period  Weeks    Status  Partially Met      PT LONG TERM GOAL #3   Title  demonstrate 4+/5 for improved function and mobility    Time  6    Period  Weeks    Status  Achieved      PT LONG TERM GOAL #4   Title  verbalize understanding of lifting and carrying techniques to decrease risk of reinjury    Time  6    Period  Weeks    Status  On-going      PT LONG TERM GOAL #5   Title  FOTO score improved to </= 30% limited    Status  On-going            Plan - 06/01/18 1406    Clinical Impression Statement  Pt reporting improved mobility in Rt shoulder with less occasions of pain.  She demonstrated improved Rt shoulder strength, but is limited in IR ROM.  Progressed shoulder resistance exercises and stretches.  Pt's FOTO score similar to intake despite improved report.  Pt has partially met her goals at this time.     Rehab Potential  Good    PT Frequency  1x / week    PT Duration  6 weeks    PT Treatment/Interventions  ADLs/Self Care Home Management;Cryotherapy;Electrical Stimulation;Ultrasound;Iontophoresis  $'4mg'R$ /ml Dexamethasone;Functional mobility training;Therapeutic activities;Therapeutic exercise;Patient/family education;Manual techniques;Taping;Dry needling;Passive range of  motion;Vasopneumatic Device    PT Next Visit Plan  Pt requests to hold therapy and reschedule as needed.  If pt doesn't return prior to 12/11, she verbalized readiness to d/c at that time.  Supervising PT made aware of pt's wishes.     Consulted and Agree with Plan of Care  Patient       Patient will benefit from skilled therapeutic intervention in order to improve the following deficits and impairments:  Increased fascial restricitons, Increased muscle spasms, Pain, Postural dysfunction, Decreased strength, Decreased range of motion, Impaired UE functional use  Visit Diagnosis: Acute pain of right shoulder  Abnormal posture  Muscle weakness (generalized)     Problem List Patient Active Problem List   Diagnosis Date Noted  . Metatarsalgia of left foot 01/05/2018  . Other hyperlipidemia 03/23/2017  . OSA (obstructive sleep apnea) 02/25/2017  . Class 1 obesity without serious comorbidity with body mass index (BMI) of 34.0 to 34.9 in adult 08/17/2016  . Elevated liver function tests 08/17/2016  . Vitamin D deficiency 08/03/2016  . Left tennis elbow 04/06/2016  . 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 Depression 08/23/2015  . Subacromial bursitis 04/23/2015  . Bilateral carpal tunnel syndrome 12/24/2014  . Trochanteric bursitis of right hip 04/21/2013  . Postop Mild Hyponatremia 07/30/2011  . OSTEOARTHRITIS, KNEE, LEFT 12/12/2009  . MIGRAINE WITH AURA 05/31/2008  . SKIN CANCER, HX OF 01/06/2008  . COLITIS, HX OF 01/06/2008  . ALLERGIC RHINITIS 05/20/2007  . HYPERTENSION, BENIGN ESSENTIAL 11/23/2006  . SCIATICA, CHRONIC 05/19/2006   Kerin Perna, PTA 06/01/18 2:14 PM  [facility], [place], [address] Phone: [phone]   Fax:  [phone]  Name: Kathy Clark MRN: [id number] Date of Birth: 06-07-1961      PHYSICAL THERAPY DISCHARGE SUMMARY  Visits from Start of Care: 3  Current  functional level related to goals / functional outcomes: See above   Remaining deficits: See above   Education / Equipment: HEP  Plan: Patient agrees to discharge.  Patient goals were not met. Patient is being discharged due to not returning since the last visit.  ?????     Laureen Abrahams, PT, DPT 06/29/18 9:21 AM   Outpatient Rehab at [facility] [place] [place], [place] [address]  [phone] (office) [phone] (fax)

## 2018-06-03 ENCOUNTER — Ambulatory Visit: Payer: 59 | Admitting: Sports Medicine

## 2018-06-14 ENCOUNTER — Ambulatory Visit (INDEPENDENT_AMBULATORY_CARE_PROVIDER_SITE_OTHER): Payer: 59 | Admitting: Family Medicine

## 2018-06-14 VITALS — BP 134/89 | HR 67 | Temp 98.0°F | Ht 68.0 in | Wt 219.0 lb

## 2018-06-14 DIAGNOSIS — I10 Essential (primary) hypertension: Secondary | ICD-10-CM

## 2018-06-14 DIAGNOSIS — E669 Obesity, unspecified: Secondary | ICD-10-CM

## 2018-06-14 DIAGNOSIS — Z6833 Body mass index (BMI) 33.0-33.9, adult: Secondary | ICD-10-CM

## 2018-06-14 DIAGNOSIS — E559 Vitamin D deficiency, unspecified: Secondary | ICD-10-CM | POA: Diagnosis not present

## 2018-06-16 DIAGNOSIS — G4733 Obstructive sleep apnea (adult) (pediatric): Secondary | ICD-10-CM | POA: Diagnosis not present

## 2018-06-16 DIAGNOSIS — Z96659 Presence of unspecified artificial knee joint: Secondary | ICD-10-CM | POA: Diagnosis not present

## 2018-06-20 NOTE — Progress Notes (Signed)
Office: [phone]  /  Fax: [phone]   HPI:   Chief Complaint: OBESITY Kathy Clark is here to discuss her progress with her obesity treatment plan. She is on the keep a food journal with 1400 calories and 80+ grams of protein daily and is following her eating plan approximately 30 % of the time. She states she is exercising 0 minutes 0 times per week. Kathy Clark went to [place] for [place] to be with her brother and his family. She did not track much. She is feeling less determined and had recent increase in Viibryd. Her weight is 219 lb (99.3 kg) today and has gained 4 pounds since her last visit. She has lost 18 lbs since starting treatment with [place].  Hypertension [name] is a 57 y.o. female with hypertension. Zynia's blood pressure is controlled today. She denies chest pain, chest pressure, or headaches. She is working weight loss to help control her blood pressure with the goal of decreasing her risk of heart attack and stroke.   Vitamin D Deficiency Kathy Clark has a diagnosis of vitamin D deficiency. She is currently taking prescription Vit D. She notes fatigue and denies nausea, vomiting or muscle weakness.  ALLERGIES: Allergies  Allergen Reactions  . Succinylcholine Anaphylaxis  . Dilaudid [Hydromorphone Hcl] Nausea And Vomiting    MEDICATIONS: Current Outpatient Medications on File Prior to Visit  Medication Sig Dispense Refill  . AMBULATORY NON FORMULARY MEDICATION Medication Name: Please decrease CPAP setting to 8 cm of water pressure and fax [place] a download after 5 days.  Is having a lot of difficulty with air leaks is working to try to reduce her pressure to see if she is still being adequately treated but with fewer leaks.FAx to [facility] 1 Units 0  . buPROPion (WELLBUTRIN SR) 150 MG 12 hr tablet Take 1 tablet (150 mg total) by mouth daily. 30 tablet 0  . Calcium Carbonate-Vitamin D (CALTRATE 600+D PO) Take 1 tablet by mouth daily.    . clonazePAM (KLONOPIN) 0.25  MG disintegrating tablet Take 1 tablet (0.25 mg total) by mouth daily as needed. 20 tablet 0  . clotrimazole-betamethasone (LOTRISONE) cream Apply 1 application topically at bedtime as needed. 15 g 0  . estradiol (VIVELLE-DOT) 0.075 MG/24HR   3  . gabapentin (NEURONTIN) 300 MG capsule Take 300 mg by mouth 2 (two) times daily.     . hydrochlorothiazide (MICROZIDE) 12.5 MG capsule Take 1 capsule (12.5 mg total) by mouth daily. 90 capsule 3  . hyoscyamine (LEVBID) 0.375 MG 12 hr tablet Take 1 tablet (0.375 mg total) by mouth 2 (two) times daily. 60 tablet 0  . ketoconazole (NIZORAL) 2 % cream Apply 1 application topically daily. To affected areas 30 g 1  . losartan (COZAAR) 25 MG tablet Take 1 tablet (25 mg total) by mouth daily. (Patient taking differently: Take 25 mg by mouth daily. Take 1/2 tab daily) 90 tablet 3  . Melatonin 3 MG TABS Take by mouth as directed.    . Multiple Vitamins-Minerals (MULTIVITAMIN ADULT PO) Take by mouth daily.    [name] pyridOXINE (VITAMIN B-6) 50 MG tablet Take 1 tablet (50 mg total) by mouth daily. 90 tablet 3  . SUMAtriptan (IMITREX) 20 MG/ACT nasal spray PLACE 1 SPRAY INTO THE NOSE EVERY 2 HOURS AS NEEDED FOR. HEADACHE 6 Inhaler 2  . Vilazodone HCl (VIIBRYD) 40 MG TABS Take 1 tablet (40 mg total) by mouth daily. 90 tablet 1  . Vitamin D, Ergocalciferol, (DRISDOL) 50000 units CAPS  capsule Take 1 capsule (50,000 Units total) by mouth every 7 (seven) days. 4 capsule 0  . zolpidem (AMBIEN) 10 MG tablet Take 1 tablet (10 mg total) by mouth at bedtime as needed for sleep. 30 tablet 2   No current facility-administered medications on file prior to visit.     PAST MEDICAL HISTORY: Past Medical History:  Diagnosis Date  . Anxiety   . Back pain   . 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 Depression   . Essential hypertension, benign   . Gastritis   . GERD (gastroesophageal reflux disease)   . Hypertension   . Joint pain   . Lymphocytic colitis     microscopic  . Migraines   . Osteoarthritis   . Sciatica    right leg  . Swelling    feet, left foot    PAST SURGICAL HISTORY: Past Surgical History:  Procedure Laterality Date  . ABDOMINAL HYSTERECTOMY  07-2005  . [place] Bunionectomy Left 09/18/2016   Left Foot  . BACK SURGERY    . KNEE SURGERY  2006   left; multiple  . microdiscetomy  2005  . SHOULDER SURGERY  07-22-09   right  . SKIN CANCER EXCISION     eyelid  . TOTAL KNEE ARTHROPLASTY  07/27/2011   Procedure: TOTAL KNEE ARTHROPLASTY;  Surgeon: Gearlean Alf;  Location: [facility];  Service: Orthopedics;  Laterality: Left;    SOCIAL HISTORY: Social History   Tobacco Use  . Smoking status: Never Smoker  . Smokeless tobacco: Never Used  Substance Use Topics  . Alcohol use: Yes    Comment: occasionally  . Drug use: No    FAMILY HISTORY: Family History  Problem Relation Age of Onset  . Breast cancer Unknown   . Tongue cancer Mother   . Breast cancer Mother   . Anxiety disorder Mother   . Depression Mother   . Heart disease Father   . Hypertension Father   . Hyperlipidemia Father   . Stroke Paternal Grandmother   . Colon cancer Neg Hx     ROS: Review of Systems  Constitutional: Positive for malaise/fatigue. Negative for weight loss.  Cardiovascular: Negative for chest pain.       Negative chest pressure  Gastrointestinal: Negative for nausea and vomiting.  Musculoskeletal:       Negative muscle weakness  Neurological: Negative for headaches.    PHYSICAL EXAM: Blood pressure 134/89, pulse 67, temperature 98 F (36.7 C), temperature source Oral, height 5\' 8"  (1.727 m), weight 219 lb (99.3 kg), SpO2 97 %. Body mass index is 33.3 kg/m. Physical Exam  Constitutional: She is oriented to person, place, and time. She appears well-developed and well-nourished.  Cardiovascular: Normal rate.  Pulmonary/Chest: Effort normal.  Musculoskeletal: Normal range of motion.  Neurological: She is oriented to person,  place, and time.  Skin: Skin is warm and dry.  Psychiatric: She has a normal mood and affect. Her behavior is normal.  Vitals reviewed.   RECENT LABS AND TESTS: BMET    Component Value Date/Time   NA 142 08/18/2017 0940   K 4.0 08/18/2017 0940   CL 101 08/18/2017 0940   CO2 25 08/18/2017 0940   GLUCOSE 89 08/18/2017 0940   GLUCOSE 79 03/26/2016 1600   BUN 12 08/18/2017 0940   CREATININE 0.70 08/18/2017 0940   CREATININE 0.61 03/26/2016 1600   CALCIUM 9.4 08/18/2017 0940   GFRNONAA 96 08/18/2017 0940   GFRNONAA >89 03/26/2016 1600  GFRAA 111 08/18/2017 0940   GFRAA >89 03/26/2016 1600   Lab Results  Component Value Date   HGBA1C 5.1 12/01/2016   HGBA1C 4.9 08/03/2016   Lab Results  Component Value Date   INSULIN 5.4 12/01/2016   INSULIN 5.9 08/03/2016   CBC    Component Value Date/Time   WBC WILL FOLLOW 08/18/2017 0940   WBC 6.7 03/26/2016 1600   RBC WILL FOLLOW 08/18/2017 0940   RBC 4.68 03/26/2016 1600   HGB WILL FOLLOW 08/18/2017 0940   HCT WILL FOLLOW 08/18/2017 0940   PLT WILL FOLLOW 08/18/2017 0940   MCV WILL FOLLOW 08/18/2017 0940   MCH WILL FOLLOW 08/18/2017 0940   MCH 30.6 03/26/2016 1600   MCHC WILL FOLLOW 08/18/2017 0940   MCHC 33.6 03/26/2016 1600   RDW WILL FOLLOW 08/18/2017 0940   LYMPHSABS WILL FOLLOW 08/18/2017 0940   MONOABS 469 03/26/2016 1600   EOSABS WILL FOLLOW 08/18/2017 0940   BASOSABS WILL FOLLOW 08/18/2017 0940   Iron/TIBC/Ferritin/ %Sat    Component Value Date/Time   FERRITIN 142 12/25/2015 0912   Lipid Panel     Component Value Date/Time   CHOL 196 05/02/2018 0943   TRIG 74 05/02/2018 0943   HDL 61 05/02/2018 0943   CHOLHDL 3.0 04/05/2015 0937   VLDL 21 04/05/2015 0937   LDLCALC 120 (H) 05/02/2018 0943   Hepatic Function Panel     Component Value Date/Time   PROT 6.9 08/18/2017 0940   ALBUMIN 4.4 08/18/2017 0940   AST 16 08/18/2017 0940   ALT 22 08/18/2017 0940   ALKPHOS 47 08/18/2017 0940   BILITOT 0.9  08/18/2017 0940   BILIDIR 0.1 12/25/2015 0912   IBILI 0.5 12/25/2015 0912      Component Value Date/Time   TSH 1.470 08/03/2016 1458   TSH 2.09 03/26/2016 1600   TSH 3.10 12/25/2015 0912  Results for Kathy Clark, Kathy Clark (MRN [id number]) as of 06/20/2018 09:50  Ref. Range 05/02/2018 09:43  Vitamin D, 25-Hydroxy Latest Ref Range: 30.0 - 100.0 ng/mL 45.6    ASSESSMENT AND PLAN: Essential hypertension  Vitamin D deficiency  Class 1 obesity with serious comorbidity and body mass index (BMI) of 33.0 to 33.9 in adult, unspecified obesity type  PLAN:  Hypertension We discussed sodium restriction, working on healthy weight loss, and a regular exercise program as the means to achieve improved blood pressure control. Leane agreed with this plan and agreed to follow up as directed. We will continue to monitor her blood pressure as well as her progress with the above lifestyle modifications. Annalyssa agrees to continue her medications and will watch for signs of hypotension as she continues her lifestyle modifications. Ariona agrees to follow up with our clinic in 3 weeks.  Vitamin D Deficiency Jomaira was informed that low vitamin D levels contributes to fatigue and are associated with obesity, breast, and colon cancer. Aryam agrees to continue taking prescription Vit D @50 ,000 IU every week and will follow up for routine testing of vitamin D, at least 2-3 times per year. She was informed of the risk of over-replacement of vitamin D and agrees to not increase her dose unless she discusses this with [place] first. Shavana agrees to follow up with our clinic in 3 weeks.  I spent > than 50% of the 15 minute visit on counseling as documented in the note.  Obesity Earnest is currently in the action stage of change. As such, her goal is to continue with weight loss efforts She has agreed  to follow the Category 2 plan + 100 calories Marshay has been instructed to work up to a goal of 150 minutes of combined cardio and  strengthening exercise per week for weight loss and overall health benefits. We discussed the following Behavioral Modification Strategies today: decrease eating out, dealing with family or coworker sabotage, holiday eating strategies, celebration eating strategies, and planning for success   Arline has agreed to follow up with our clinic in 3 weeks. She was informed of the importance of frequent follow up visits to maximize her success with intensive lifestyle modifications for her multiple health conditions.   OBESITY BEHAVIORAL INTERVENTION VISIT  Today's visit was # 40   Starting weight: 237 lbs Starting date: 08/03/16 Today's weight : 219 lbs Today's date: 06/14/2018 Total lbs lost to date: 15    ASK: We discussed the diagnosis of obesity with Marianne Sofia today and Tynia agreed to give [place] permission to discuss obesity behavioral modification therapy today.  ASSESS: Kaetlin has the diagnosis of obesity and her BMI today is 33.31 Saidee is in the action stage of change   ADVISE: Samaya was educated on the multiple health risks of obesity as well as the benefit of weight loss to improve her health. She was advised of the need for long term treatment and the importance of lifestyle modifications to improve her current health and to decrease her risk of future health problems.  AGREE: Multiple dietary modification options and treatment options were discussed and  Madylin agreed to follow the recommendations documented in the above note.  ARRANGE: Blair was educated on the importance of frequent visits to treat obesity as outlined per CMS and USPSTF guidelines and agreed to schedule her next follow up appointment today.  I, Trixie Dredge, am acting as transcriptionist for Ilene Qua, MD  I have reviewed the above documentation for accuracy and completeness, and I agree with the above. - Ilene Qua, MD

## 2018-06-27 ENCOUNTER — Telehealth (INDEPENDENT_AMBULATORY_CARE_PROVIDER_SITE_OTHER): Payer: Self-pay

## 2018-06-27 NOTE — Telephone Encounter (Signed)
Amy, I have spoke with patient and rescheduled her appt. To 07/18/18 @ 4:20 with Dr Adair Patter.

## 2018-06-29 ENCOUNTER — Ambulatory Visit (INDEPENDENT_AMBULATORY_CARE_PROVIDER_SITE_OTHER): Payer: 59 | Admitting: Family Medicine

## 2018-06-29 ENCOUNTER — Other Ambulatory Visit (INDEPENDENT_AMBULATORY_CARE_PROVIDER_SITE_OTHER): Payer: Self-pay | Admitting: Family Medicine

## 2018-06-29 ENCOUNTER — Encounter (INDEPENDENT_AMBULATORY_CARE_PROVIDER_SITE_OTHER): Payer: Self-pay

## 2018-06-29 DIAGNOSIS — F3289 Other specified depressive episodes: Secondary | ICD-10-CM

## 2018-06-29 MED ORDER — BUPROPION HCL ER (SR) 150 MG PO TB12: 150.0000 mg | ORAL_TABLET | Freq: Every day | ORAL | 0 refills | Status: DC

## 2018-06-29 MED FILL — ESTRADIOL 0.075 MG PATCH: 0.075 | 28 days supply | Qty: 8 | Fill #0

## 2018-06-29 MED FILL — BUPROPION SR 150 MG TABLET: 150 | 30 days supply | Qty: 30 | Fill #0

## 2018-07-14 ENCOUNTER — Ambulatory Visit (INDEPENDENT_AMBULATORY_CARE_PROVIDER_SITE_OTHER): Payer: 59 | Admitting: Sports Medicine

## 2018-07-14 ENCOUNTER — Encounter: Payer: Self-pay | Admitting: Sports Medicine

## 2018-07-14 DIAGNOSIS — G5603 Carpal tunnel syndrome, bilateral upper limbs: Secondary | ICD-10-CM

## 2018-07-14 MED ORDER — GABAPENTIN 300 MG PO CAPS: ORAL_CAPSULE | ORAL | 3 refills | Status: DC

## 2018-07-14 NOTE — Assessment & Plan Note (Addendum)
Previous bilateral median nerve hydrodissection was in October, return of symptoms. She does need surgical intervention, this has to be done after February 15. Referral to hand surgery. Increasing gabapentin to 2 pills twice daily. She will do her own up taper. Return as needed.

## 2018-07-14 NOTE — Progress Notes (Addendum)
Subjective:    CC: Bilateral carpal tunnel syndrome  HPI: Kathy Clark returns, she is a pleasant 58 year old female, she has known bilateral carpal tunnel syndrome and has done well with occasional bilateral median nerve hydrodissections, the most recent one was back in October, she has not had sufficient long-lasting relief, we did get her established with Dr. Amedeo Plenty to use when surgical intervention became necessary.  She is now having a recurrence of symptoms, severe, persistent, agreeable to make her surgical appointment.  I reviewed the past medical history, family history, social history, surgical history, and allergies today and no changes were needed.  Please see the problem list section below in epic for further details.  Past Medical History: Past Medical History:  Diagnosis Date  . Anxiety   . Back pain   . 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 Depression   . Essential hypertension, benign   . Gastritis   . GERD (gastroesophageal reflux disease)   . Hypertension   . Joint pain   . Lymphocytic colitis    microscopic  . Migraines   . Osteoarthritis   . Sciatica    right leg  . Swelling    feet, left foot   Past Surgical History: Past Surgical History:  Procedure Laterality Date  . ABDOMINAL HYSTERECTOMY  07-2005  . [place] Bunionectomy Left 09/18/2016   Left Foot  . BACK SURGERY    . KNEE SURGERY  2006   left; multiple  . microdiscetomy  2005  . SHOULDER SURGERY  07-22-09   right  . SKIN CANCER EXCISION     eyelid  . TOTAL KNEE ARTHROPLASTY  07/27/2011   Procedure: TOTAL KNEE ARTHROPLASTY;  Surgeon: Gearlean Alf;  Location: [facility];  Service: Orthopedics;  Laterality: Left;   Social History: Social History   Socioeconomic History  . Marital status: Legally Separated    Spouse name: Jenny Reichmann  . Number of children: 2  . Years of education: Not on file  . Highest education level: Not on file  Occupational History  . Occupation: Education administrator: [employer]  . Financial resource strain: Not on file  . Food insecurity:    Worry: Not on file    Inability: Not on file  . Transportation needs:    Medical: Not on file    Non-medical: Not on file  Tobacco Use  . Smoking status: Never Smoker  . Smokeless tobacco: Never Used  Substance and Sexual Activity  . Alcohol use: Yes    Comment: occasionally  . Drug use: No  . 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  Lifestyle  . Physical activity:    Days per week: Not on file    Minutes per session: Not on file  . Stress: Not on file  Relationships  . Social connections:    Talks on phone: Not on file    Gets together: Not on file    Attends religious service: Not on file    Active member of club or organization: Not on file    Attends meetings of clubs or organizations: Not on file    Relationship status: Not on file  Other Topics Concern  . Not on file  Social History Narrative  . Not on file   Family History: Family History  Problem Relation Age of Onset  . Breast cancer Unknown   . Tongue cancer Mother   .  Breast cancer Mother   . Anxiety disorder Mother   . Depression Mother   . Heart disease Father   . Hypertension Father   . Hyperlipidemia Father   . Stroke Paternal Grandmother   . Colon cancer Neg Hx    Allergies: Allergies  Allergen Reactions  . Succinylcholine Anaphylaxis  . Dilaudid [Hydromorphone Hcl] Nausea And Vomiting   Medications: See med rec.  Review of Systems: No fevers, chills, night sweats, weight loss, chest pain, or shortness of breath.   Objective:    General: Well Developed, well nourished, and in no acute distress.  Neuro: Alert and oriented x3, extra-ocular muscles intact, sensation grossly intact.  HEENT: Normocephalic, atraumatic, pupils equal round reactive to light, neck supple, no masses, no lymphadenopathy, thyroid nonpalpable.  Skin: Warm and  dry, no rashes. Cardiac: Regular rate and rhythm, no murmurs rubs or gallops, no lower extremity edema.  Respiratory: Clear to auscultation bilaterally. Not using accessory muscles, speaking in full sentences.  Impression and Recommendations:    Bilateral carpal tunnel syndrome Previous bilateral median nerve hydrodissection was in October, return of symptoms. She does need surgical intervention, this has to be done after February 15. Referral to hand surgery. Increasing gabapentin to 2 pills twice daily. She will do her own up taper. Return as needed. ___________________________________________ Gwen Her. Dianah Field, M.D., ABFM., CAQSM. Primary Care and Sports Medicine [facility]  Adjunct Professor of [employer] [facility] [employer]

## 2018-07-15 ENCOUNTER — Encounter: Payer: Self-pay | Admitting: Sports Medicine

## 2018-07-15 NOTE — Addendum Note (Signed)
Addended by: Silverio Decamp on: 07/15/2018 06:13 PM   Modules accepted: Orders

## 2018-07-18 ENCOUNTER — Ambulatory Visit (INDEPENDENT_AMBULATORY_CARE_PROVIDER_SITE_OTHER): Payer: 59 | Admitting: Family Medicine

## 2018-07-18 ENCOUNTER — Encounter (INDEPENDENT_AMBULATORY_CARE_PROVIDER_SITE_OTHER): Payer: Self-pay | Admitting: Family Medicine

## 2018-07-18 VITALS — BP 118/77 | HR 65 | Temp 97.5°F | Ht 68.0 in | Wt 222.0 lb

## 2018-07-18 DIAGNOSIS — E559 Vitamin D deficiency, unspecified: Secondary | ICD-10-CM

## 2018-07-18 DIAGNOSIS — Z6833 Body mass index (BMI) 33.0-33.9, adult: Secondary | ICD-10-CM

## 2018-07-18 DIAGNOSIS — Z9189 Other specified personal risk factors, not elsewhere classified: Secondary | ICD-10-CM

## 2018-07-18 DIAGNOSIS — I10 Essential (primary) hypertension: Secondary | ICD-10-CM

## 2018-07-18 DIAGNOSIS — E669 Obesity, unspecified: Secondary | ICD-10-CM | POA: Diagnosis not present

## 2018-07-18 MED ORDER — VITAMIN D (ERGOCALCIFEROL) 1.25 MG (50000 UNIT) PO CAPS: 50000.0000 [IU] | ORAL_CAPSULE | ORAL | 0 refills | Status: DC

## 2018-07-18 MED FILL — VIT D2 1.25 MG (50,000 UNIT: 1.25 MG | 28 days supply | Qty: 4 | Fill #0

## 2018-07-19 NOTE — Progress Notes (Signed)
Office: [phone]  /  Fax: [phone]   HPI:   Chief Complaint: OBESITY Kathy Clark is here to discuss her progress with her obesity treatment plan. She is on the Category 2 plan + 100 calories and is following her eating plan approximately 50 % of the time. She states she is exercising 0 minutes 0 times per week. Kathy Clark had an enjoyable Christmas and New Years Eve with family and new female friend. She plans to see a hand surgeon in 2 days for carpal tunnel release.  Her weight is 222 lb (100.7 kg) today and has gained 3 pounds since her last visit. She has lost 15 lbs since starting treatment with [place].  Vitamin D Deficiency Kathy Clark has a diagnosis of vitamin D deficiency. She is currently taking prescription Vit D. She notes fatigue and denies nausea, vomiting or muscle weakness.  At risk for osteopenia and osteoporosis Kathy Clark is at higher risk of osteopenia and osteoporosis due to vitamin D deficiency.   Hypertension Kathy Clark is a 58 y.o. female with hypertension. Mckynzie's blood pressure is controlled. She denies chest pain, chest pressure, or headaches. She is working weight loss to help control her blood pressure with the goal of decreasing her risk of heart attack and stroke.   ASSESSMENT AND PLAN:  Vitamin D deficiency - Plan: Vitamin D, Ergocalciferol, (DRISDOL) 1.25 MG (50000 UT) CAPS capsule  Essential hypertension  At risk for osteoporosis  Class 1 obesity with serious comorbidity and body mass index (BMI) of 33.0 to 33.9 in adult, unspecified obesity type  PLAN:  Vitamin D Deficiency Kathy Clark was informed that low vitamin D levels contributes to fatigue and are associated with obesity, breast, and colon cancer. Kathy Clark agrees to continue taking prescription Vit D @50 ,000 IU every week #4 and we will refill for 1 month. She will follow up for routine testing of vitamin D, at least 2-3 times per year. She was informed of the risk of over-replacement of vitamin D and agrees to  not increase her dose unless she discusses this with [place] first. Kathy Clark agrees to follow up with our clinic in 3 weeks.  At risk for osteopenia and osteoporosis Kathy Clark was given extended (15 minutes) osteoporosis prevention counseling today. Kathy Clark is at risk for osteopenia and osteoporsis due to her vitamin D deficiency. She was encouraged to take her vitamin D and follow her higher calcium diet and increase strengthening exercise to help strengthen her bones and decrease her risk of osteopenia and osteoporosis.  Hypertension We discussed sodium restriction, working on healthy weight loss, and a regular exercise program as the means to achieve improved blood pressure control. Kathy Clark agreed with this plan and agreed to follow up as directed. We will continue to monitor her blood pressure as well as her progress with the above lifestyle modifications. Kathy Clark agrees to continue taking losartan and will watch for signs of hypotension as she continues her lifestyle modifications. Kathy Clark agrees to follow up with our clinic in 3 weeks.  Obesity Kathy Clark is currently in the action stage of change. As such, her goal is to continue with weight loss efforts She has agreed to keep a food journal with 1300-1400 calories and 85+ grams of protein daily Kathy Clark has been instructed to work up to a goal of 150 minutes of combined cardio and strengthening exercise per week for weight loss and overall health benefits. We discussed the following Behavioral Modification Strategies today: increasing lean protein intake, work on meal planning and easy cooking plans,  planning for success, and keep a strict food journal   Kathy Clark has agreed to follow up with our clinic in 3 weeks. She was informed of the importance of frequent follow up visits to maximize her success with intensive lifestyle modifications for her multiple health conditions.  ALLERGIES: Allergies  Allergen Reactions  . Succinylcholine Anaphylaxis  . Dilaudid  [Hydromorphone Hcl] Nausea And Vomiting    MEDICATIONS: Current Outpatient Medications on File Prior to Visit  Medication Sig Dispense Refill  . AMBULATORY NON FORMULARY MEDICATION Medication Name: Please decrease CPAP setting to 8 cm of water pressure and fax [place] a download after 5 days.  Is having a lot of difficulty with air leaks is working to try to reduce her pressure to see if she is still being adequately treated but with fewer leaks.FAx to [place] 1 Units 0  . buPROPion (WELLBUTRIN SR) 150 MG 12 hr tablet Take 1 tablet (150 mg total) by mouth daily. 30 tablet 0  . Calcium Carbonate-Vitamin D (CALTRATE 600+D PO) Take 1 tablet by mouth daily.    . clonazePAM (KLONOPIN) 0.25 MG disintegrating tablet Take 1 tablet (0.25 mg total) by mouth daily as needed. 20 tablet 0  . clotrimazole-betamethasone (LOTRISONE) cream Apply 1 application topically at bedtime as needed. 15 g 0  . estradiol (VIVELLE-DOT) 0.075 MG/24HR   3  . gabapentin (NEURONTIN) 300 MG capsule 2 caps p.o. twice daily for a week then increase dose as needed up to a maximum of 3600 mg daily 180 capsule 3  . hydrochlorothiazide (MICROZIDE) 12.5 MG capsule Take 1 capsule (12.5 mg total) by mouth daily. 90 capsule 3  . hyoscyamine (LEVBID) 0.375 MG 12 hr tablet Take 1 tablet (0.375 mg total) by mouth 2 (two) times daily. 60 tablet 0  . ketoconazole (NIZORAL) 2 % cream Apply 1 application topically daily. To affected areas 30 g 1  . losartan (COZAAR) 25 MG tablet Take 1 tablet (25 mg total) by mouth daily. (Patient taking differently: Take 25 mg by mouth daily. Take 1/2 tab daily) 90 tablet 3  . Melatonin 3 MG TABS Take by mouth as directed.    . Multiple Vitamins-Minerals (MULTIVITAMIN ADULT PO) Take by mouth daily.    [name] pyridOXINE (VITAMIN B-6) 50 MG tablet Take 1 tablet (50 mg total) by mouth daily. 90 tablet 3  . SUMAtriptan (IMITREX) 20 MG/ACT nasal spray PLACE 1 SPRAY INTO THE NOSE EVERY 2 HOURS AS NEEDED FOR. HEADACHE  6 Inhaler 2  . Vilazodone HCl (VIIBRYD) 40 MG TABS Take 1 tablet (40 mg total) by mouth daily. 90 tablet 1  . zolpidem (AMBIEN) 10 MG tablet Take 1 tablet (10 mg total) by mouth at bedtime as needed for sleep. 30 tablet 2   No current facility-administered medications on file prior to visit.     PAST MEDICAL HISTORY: Past Medical History:  Diagnosis Date  . Anxiety   . Back pain   . 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 Depression   . Essential hypertension, benign   . Gastritis   . GERD (gastroesophageal reflux disease)   . Hypertension   . Joint pain   . Lymphocytic colitis    microscopic  . Migraines   . Osteoarthritis   . Sciatica    right leg  . Swelling    feet, left foot    PAST SURGICAL HISTORY: Past Surgical History:  Procedure Laterality Date  . ABDOMINAL HYSTERECTOMY  07-2005  . Liane Comber  Bunionectomy Left 09/18/2016   Left Foot  . BACK SURGERY    . KNEE SURGERY  2006   left; multiple  . microdiscetomy  2005  . SHOULDER SURGERY  07-22-09   right  . SKIN CANCER EXCISION     eyelid  . TOTAL KNEE ARTHROPLASTY  07/27/2011   Procedure: TOTAL KNEE ARTHROPLASTY;  Surgeon: Gearlean Alf;  Location: [facility];  Service: Orthopedics;  Laterality: Left;    SOCIAL HISTORY: Social History   Tobacco Use  . Smoking status: Never Smoker  . Smokeless tobacco: Never Used  Substance Use Topics  . Alcohol use: Yes    Comment: occasionally  . Drug use: No    FAMILY HISTORY: Family History  Problem Relation Age of Onset  . Breast cancer Unknown   . Tongue cancer Mother   . Breast cancer Mother   . Anxiety disorder Mother   . Depression Mother   . Heart disease Father   . Hypertension Father   . Hyperlipidemia Father   . Stroke Paternal Grandmother   . Colon cancer Neg Hx     ROS: Review of Systems  Constitutional: Positive for malaise/fatigue. Negative for weight loss.  Cardiovascular: Negative for chest pain.       Negative chest  pressure  Gastrointestinal: Negative for nausea and vomiting.  Musculoskeletal:       Negative muscle weakness  Neurological: Negative for headaches.    PHYSICAL EXAM: Blood pressure 118/77, pulse 65, temperature (!) 97.5 F (36.4 C), temperature source Oral, height 5\' 8"  (1.727 m), weight 222 lb (100.7 kg), SpO2 98 %. Body mass index is 33.75 kg/m. Physical Exam Vitals signs reviewed.  Constitutional:      Appearance: Normal appearance. She is obese.  Cardiovascular:     Rate and Rhythm: Normal rate.     Pulses: Normal pulses.  Pulmonary:     Effort: Pulmonary effort is normal.  Musculoskeletal: Normal range of motion.  Skin:    General: Skin is warm and dry.  Neurological:     Mental Status: She is alert and oriented to person, place, and time.  Psychiatric:        Mood and Affect: Mood normal.        Behavior: Behavior normal.     RECENT LABS AND TESTS: BMET    Component Value Date/Time   NA 142 08/18/2017 0940   K 4.0 08/18/2017 0940   CL 101 08/18/2017 0940   CO2 25 08/18/2017 0940   GLUCOSE 89 08/18/2017 0940   GLUCOSE 79 03/26/2016 1600   BUN 12 08/18/2017 0940   CREATININE 0.70 08/18/2017 0940   CREATININE 0.61 03/26/2016 1600   CALCIUM 9.4 08/18/2017 0940   GFRNONAA 96 08/18/2017 0940   GFRNONAA >89 03/26/2016 1600   GFRAA 111 08/18/2017 0940   GFRAA >89 03/26/2016 1600   Lab Results  Component Value Date   HGBA1C 5.1 12/01/2016   HGBA1C 4.9 08/03/2016   Lab Results  Component Value Date   INSULIN 5.4 12/01/2016   INSULIN 5.9 08/03/2016   CBC    Component Value Date/Time   WBC WILL FOLLOW 08/18/2017 0940   WBC 6.7 03/26/2016 1600   RBC WILL FOLLOW 08/18/2017 0940   RBC 4.68 03/26/2016 1600   HGB WILL FOLLOW 08/18/2017 0940   HCT WILL FOLLOW 08/18/2017 0940   PLT WILL FOLLOW 08/18/2017 0940   MCV WILL FOLLOW 08/18/2017 0940   MCH WILL FOLLOW 08/18/2017 0940   MCH 30.6 03/26/2016 1600  MCHC WILL FOLLOW 08/18/2017 0940   MCHC 33.6  03/26/2016 1600   RDW WILL FOLLOW 08/18/2017 0940   LYMPHSABS WILL FOLLOW 08/18/2017 0940   MONOABS 469 03/26/2016 1600   EOSABS WILL FOLLOW 08/18/2017 0940   BASOSABS WILL FOLLOW 08/18/2017 0940   Iron/TIBC/Ferritin/ %Sat    Component Value Date/Time   FERRITIN 142 12/25/2015 0912   Lipid Panel     Component Value Date/Time   CHOL 196 05/02/2018 0943   TRIG 74 05/02/2018 0943   HDL 61 05/02/2018 0943   CHOLHDL 3.0 04/05/2015 0937   VLDL 21 04/05/2015 0937   LDLCALC 120 (H) 05/02/2018 0943   Hepatic Function Panel     Component Value Date/Time   PROT 6.9 08/18/2017 0940   ALBUMIN 4.4 08/18/2017 0940   AST 16 08/18/2017 0940   ALT 22 08/18/2017 0940   ALKPHOS 47 08/18/2017 0940   BILITOT 0.9 08/18/2017 0940   BILIDIR 0.1 12/25/2015 0912   IBILI 0.5 12/25/2015 0912      Component Value Date/Time   TSH 1.470 08/03/2016 1458   TSH 2.09 03/26/2016 1600   TSH 3.10 12/25/2015 0912      OBESITY BEHAVIORAL INTERVENTION VISIT  Today's visit was # 4   Starting weight: 237 lbs Starting date: 08/03/16 Today's weight : 222 lbs  Today's date: 07/18/2018 Total lbs lost to date: 15    ASK: We discussed the diagnosis of obesity with Kathy Clark today and Kathy Clark agreed to give [place] permission to discuss obesity behavioral modification therapy today.  ASSESS: Yamaris has the diagnosis of obesity and her BMI today is 33.76 Krystyl is in the action stage of change   ADVISE: Kathy Clark was educated on the multiple health risks of obesity as well as the benefit of weight loss to improve her health. She was advised of the need for long term treatment and the importance of lifestyle modifications to improve her current health and to decrease her risk of future health problems.  AGREE: Multiple dietary modification options and treatment options were discussed and  Kathy Clark agreed to follow the recommendations documented in the above note.  ARRANGE: Kathy Clark was educated on the importance  of frequent visits to treat obesity as outlined per CMS and USPSTF guidelines and agreed to schedule her next follow up appointment today.  I, Trixie Dredge, am acting as transcriptionist for Ilene Qua, MD  I have reviewed the above documentation for accuracy and completeness, and I agree with the above. - Ilene Qua, MD

## 2018-07-21 DIAGNOSIS — G5601 Carpal tunnel syndrome, right upper limb: Secondary | ICD-10-CM | POA: Diagnosis not present

## 2018-07-21 DIAGNOSIS — G5602 Carpal tunnel syndrome, left upper limb: Secondary | ICD-10-CM | POA: Diagnosis not present

## 2018-07-25 ENCOUNTER — Telehealth: Payer: 59 | Admitting: Family

## 2018-07-25 DIAGNOSIS — R059 Cough, unspecified: Secondary | ICD-10-CM

## 2018-07-25 DIAGNOSIS — R05 Cough: Secondary | ICD-10-CM

## 2018-07-25 MED ORDER — BENZONATATE 100 MG PO CAPS: 100.0000 mg | ORAL_CAPSULE | Freq: Three times a day (TID) | ORAL | 0 refills | Status: DC | PRN

## 2018-07-25 MED ORDER — PREDNISONE 10 MG (21) PO TBPK: ORAL_TABLET | ORAL | 0 refills | Status: DC

## 2018-07-25 MED FILL — predniSONE 10 MG TABS: 10 | 6 days supply | Qty: 21 | Fill #0

## 2018-07-25 MED FILL — BENZONATATE 100 MG CAPS: 100 | 7 days supply | Qty: 20 | Fill #0

## 2018-07-25 NOTE — Progress Notes (Signed)
We are sorry that you are not feeling well.  Here is how we plan to help!  Based on your presentation I believe you most likely have A cough due to a virus.  This is called viral bronchitis and is best treated by rest, plenty of fluids and control of the cough.  You may use Ibuprofen or Tylenol as directed to help your symptoms.     In addition you may use A non-prescription cough medication called Robitussin DAC. Take 2 teaspoons every 8 hours or Delsym: take 2 teaspoons every 12 hours., A non-prescription cough medication called Mucinex DM: take 2 tablets every 12 hours. and A prescription cough medication called Tessalon Perles 100mg. You may take 1-2 capsules every 8 hours as needed for your cough.  Prednisone 10 mg daily for 6 days (see taper instructions below)  Directions for 6 day taper: Day 1: 2 tablets before breakfast, 1 after both lunch & dinner and 2 at bedtime Day 2: 1 tab before breakfast, 1 after both lunch & dinner and 2 at bedtime Day 3: 1 tab at each meal & 1 at bedtime Day 4: 1 tab at breakfast, 1 at lunch, 1 at bedtime Day 5: 1 tab at breakfast & 1 tab at bedtime Day 6: 1 tab at breakfast   From your responses in the eVisit questionnaire you describe inflammation in the upper respiratory tract which is causing a significant cough.  This is commonly called Bronchitis and has four common causes:    Allergies  Viral Infections  Acid Reflux  Bacterial Infection Allergies, viruses and acid reflux are treated by controlling symptoms or eliminating the cause. An example might be a cough caused by taking certain blood pressure medications. You stop the cough by changing the medication. Another example might be a cough caused by acid reflux. Controlling the reflux helps control the cough.  USE OF BRONCHODILATOR ("RESCUE") INHALERS: There is a risk from using your bronchodilator too frequently.  The risk is that over-reliance on a medication which only relaxes the muscles  surrounding the breathing tubes can reduce the effectiveness of medications prescribed to reduce swelling and congestion of the tubes themselves.  Although you feel brief relief from the bronchodilator inhaler, your asthma may actually be worsening with the tubes becoming more swollen and filled with mucus.  This can delay other crucial treatments, such as oral steroid medications. If you need to use a bronchodilator inhaler daily, several times per day, you should discuss this with your provider.  There are probably better treatments that could be used to keep your asthma under control.     HOME CARE . Only take medications as instructed by your medical team. . Complete the entire course of an antibiotic. . Drink plenty of fluids and get plenty of rest. . Avoid close contacts especially the very young and the elderly . Cover your mouth if you cough or cough into your sleeve. . Always remember to wash your hands . A steam or ultrasonic humidifier can help congestion.   GET HELP RIGHT AWAY IF: . You develop worsening fever. . You become short of breath . You cough up blood. . Your symptoms persist after you have completed your treatment plan MAKE SURE YOU   Understand these instructions.  Will watch your condition.  Will get help right away if you are not doing well or get worse.  Your e-visit answers were reviewed by a board certified advanced clinical practitioner to complete your personal care plan.    Depending on the condition, your plan could have included both over the counter or prescription medications. If there is a problem please reply  once you have received a response from your provider. Your safety is important to us.  If you have drug allergies check your prescription carefully.    You can use MyChart to ask questions about today's visit, request a non-urgent call back, or ask for a work or school excuse for 24 hours related to this e-Visit. If it has been greater than 24  hours you will need to follow up with your provider, or enter a new e-Visit to address those concerns. You will get an e-mail in the next two days asking about your experience.  I hope that your e-visit has been valuable and will speed your recovery. Thank you for using e-visits.   

## 2018-07-28 MED FILL — BUPROPION SR 150 MG TABLET: 150 | 30 days supply | Qty: 30 | Fill #0

## 2018-07-29 ENCOUNTER — Telehealth: Payer: 59 | Admitting: Family Medicine

## 2018-07-29 ENCOUNTER — Encounter: Payer: Self-pay | Admitting: Family Medicine

## 2018-07-29 ENCOUNTER — Ambulatory Visit: Payer: 59 | Admitting: Sports Medicine

## 2018-07-29 DIAGNOSIS — J40 Bronchitis, not specified as acute or chronic: Secondary | ICD-10-CM | POA: Diagnosis not present

## 2018-07-29 DIAGNOSIS — R059 Cough, unspecified: Secondary | ICD-10-CM

## 2018-07-29 DIAGNOSIS — R05 Cough: Secondary | ICD-10-CM | POA: Diagnosis not present

## 2018-07-29 MED ORDER — ALBUTEROL SULFATE HFA 108 (90 BASE) MCG/ACT IN AERS: 2.0000 | INHALATION_SPRAY | Freq: Four times a day (QID) | RESPIRATORY_TRACT | 0 refills | Status: DC | PRN

## 2018-07-29 MED ORDER — DOXYCYCLINE HYCLATE 100 MG PO TABS: 100.0000 mg | ORAL_TABLET | Freq: Two times a day (BID) | ORAL | 0 refills | Status: AC

## 2018-07-29 MED FILL — DOXYCYCLINE HYCLATE 100 MG: 100 | 7 days supply | Qty: 14 | Fill #0

## 2018-07-29 MED FILL — VENTOLIN HFA 90 MCG INHALER: 108 (90 BAS | 25 days supply | Qty: 18 | Fill #0

## 2018-07-29 MED FILL — ESTRADIOL 0.075 MG PATCH: 0.075 | 28 days supply | Qty: 8 | Fill #0

## 2018-07-29 NOTE — Progress Notes (Signed)
We are sorry that you are not feeling well.  Here is how we plan to help!  Based on your presentation I believe you most likely have A cough due to bacteria.  When patients have a fever and a productive cough with a change in color or increased sputum production, we are concerned about bacterial bronchitis.  If left untreated it can progress to pneumonia.  If your symptoms do not improve with your treatment plan it is important that you contact your provider.   I have prescribed Doxycycline 100 mg twice a day for 7 days     I prescribed the antibiotic to ensure you are well for your upcoming surgery, length of symptoms and recent hospital visit of family increasing your risk of exposure. I believe regarding the prednisone it may be best to reach out to the surgeons office as they may have some preferences for how recently you were on a medication like prednisone and the 2nd and third order effects of this as it related to the surgery and healing time post-op. I would continue the tessalon perles and a OTC cough medication that you have has success with in the past and I will send an inhaler to help open up your airways- please use if as directed versus as needed for the next 2 days. Hope this helps- feel better soon. We need you healthy:) Of course if you are not improved seeking face to face care sooner than later is prudent.    From your responses in the eVisit questionnaire you describe inflammation in the upper respiratory tract which is causing a significant cough.  This is commonly called Bronchitis and has four common causes:    Allergies  Viral Infections  Acid Reflux  Bacterial Infection Allergies, viruses and acid reflux are treated by controlling symptoms or eliminating the cause. An example might be a cough caused by taking certain blood pressure medications. You stop the cough by changing the medication. Another example might be a cough caused by acid reflux. Controlling the reflux helps  control the cough.  USE OF BRONCHODILATOR ("RESCUE") INHALERS: There is a risk from using your bronchodilator too frequently.  The risk is that over-reliance on a medication which only relaxes the muscles surrounding the breathing tubes can reduce the effectiveness of medications prescribed to reduce swelling and congestion of the tubes themselves.  Although you feel brief relief from the bronchodilator inhaler, your asthma may actually be worsening with the tubes becoming more swollen and filled with mucus.  This can delay other crucial treatments, such as oral steroid medications. If you need to use a bronchodilator inhaler daily, several times per day, you should discuss this with your provider.  There are probably better treatments that could be used to keep your asthma under control.     HOME CARE . Only take medications as instructed by your medical team. . Complete the entire course of an antibiotic. . Drink plenty of fluids and get plenty of rest. . Avoid close contacts especially the very young and the elderly . Cover your mouth if you cough or cough into your sleeve. . Always remember to wash your hands . A steam or ultrasonic humidifier can help congestion.   GET HELP RIGHT AWAY IF: . You develop worsening fever. . You become short of breath . You cough up blood. . Your symptoms persist after you have completed your treatment plan MAKE SURE YOU   Understand these instructions.  Will watch your condition.  Will get help right away if you are not doing well or get worse.  Your e-visit answers were reviewed by a board certified advanced clinical practitioner to complete your personal care plan.  Depending on the condition, your plan could have included both over the counter or prescription medications. If there is a problem please reply  once you have received a response from your provider. Your safety is important to [place].  If you have drug allergies check your prescription  carefully.    You can use MyChart to ask questions about today's visit, request a non-urgent call back, or ask for a work or school excuse for 24 hours related to this e-Visit. If it has been greater than 24 hours you will need to follow up with your provider, or enter a new e-Visit to address those concerns. You will get an e-mail in the next two days asking about your experience.  I hope that your e-visit has been valuable and will speed your recovery. Thank you for using e-visits.

## 2018-08-01 MED FILL — GABAPENTIN 300 MG CAPSULE: 300 | 45 days supply | Qty: 180 | Fill #0

## 2018-08-02 DIAGNOSIS — R200 Anesthesia of skin: Secondary | ICD-10-CM | POA: Diagnosis not present

## 2018-08-08 ENCOUNTER — Encounter: Payer: Self-pay | Admitting: Family Medicine

## 2018-08-08 ENCOUNTER — Encounter (INDEPENDENT_AMBULATORY_CARE_PROVIDER_SITE_OTHER): Payer: Self-pay | Admitting: Family Medicine

## 2018-08-08 ENCOUNTER — Ambulatory Visit (INDEPENDENT_AMBULATORY_CARE_PROVIDER_SITE_OTHER): Payer: 59 | Admitting: Family Medicine

## 2018-08-08 ENCOUNTER — Other Ambulatory Visit: Payer: Self-pay | Admitting: Family Medicine

## 2018-08-08 VITALS — BP 132/77 | HR 69 | Temp 97.6°F | Ht 68.0 in | Wt 220.0 lb

## 2018-08-08 DIAGNOSIS — I10 Essential (primary) hypertension: Secondary | ICD-10-CM | POA: Diagnosis not present

## 2018-08-08 DIAGNOSIS — E669 Obesity, unspecified: Secondary | ICD-10-CM

## 2018-08-08 DIAGNOSIS — E559 Vitamin D deficiency, unspecified: Secondary | ICD-10-CM | POA: Diagnosis not present

## 2018-08-08 DIAGNOSIS — Z6833 Body mass index (BMI) 33.0-33.9, adult: Secondary | ICD-10-CM | POA: Diagnosis not present

## 2018-08-08 MED ORDER — SUMATRIPTAN 20 MG/ACT NA SOLN: NASAL | 2 refills | Status: DC

## 2018-08-08 MED FILL — SUMAtriptan 20 MG/ACT SOLN: 20 | 30 days supply | Qty: 6 | Fill #0

## 2018-08-09 ENCOUNTER — Encounter: Payer: Self-pay | Admitting: Family Medicine

## 2018-08-09 MED ORDER — VILAZODONE HCL 20 MG PO TABS: 20.0000 mg | ORAL_TABLET | Freq: Every day | ORAL | 1 refills | Status: DC

## 2018-08-09 MED ORDER — ZOLPIDEM TARTRATE 10 MG PO TABS: 10.0000 mg | ORAL_TABLET | Freq: Every evening | ORAL | 3 refills | Status: DC | PRN

## 2018-08-09 MED FILL — ZOLPIDEM TARTRATE 10 MG TAB: 10 | 30 days supply | Qty: 30 | Fill #0

## 2018-08-09 MED FILL — VIIBRYD 20 MG TABLET: 20 | 30 days supply | Qty: 30 | Fill #0

## 2018-08-09 NOTE — Progress Notes (Signed)
Office: [phone]  /  Fax: [phone]   HPI:   Chief Complaint: OBESITY Kathy Clark is here to discuss her progress with her obesity treatment plan. She is on the keep a food journal with 1300-1400 calories and 85+ grams of protein daily and is following her eating plan approximately 50 % of the time. She states she is walking for 30 minutes 3 times per week. Kathy Clark recently changed jobs. She is planning for carpel tunnel surgery in 4 days and is planning to move in a week and a half. She is filing for a divorce on February 4th.  Her weight is 220 lb (99.8 kg) today and has had a weight loss of 2 pounds over a period of 3 weeks since her last visit. She has lost 17 lbs since starting treatment with [place].  Hypertension Kathy Clark is a 58 y.o. female with hypertension. Kathy Clark's blood pressure is controlled today. She denies chest pain, chest pressure, or headaches. She is working on weight loss to help control her blood pressure with the goal of decreasing her risk of heart attack and stroke.   Vitamin D Deficiency Kathy Clark has a diagnosis of vitamin D deficiency. She is currently taking prescription Vit D. She notes fatigue and denies nausea, vomiting or muscle weakness.  ASSESSMENT AND PLAN:  Essential hypertension  Vitamin D deficiency  Class 1 obesity with serious comorbidity and body mass index (BMI) of 33.0 to 33.9 in adult, unspecified obesity type  PLAN:  Hypertension We discussed sodium restriction, working on healthy weight loss, and a regular exercise program as the means to achieve improved blood pressure control. Kathy Clark agreed with this plan and agreed to follow up as directed. We will continue to monitor her blood pressure as well as her progress with the above lifestyle modifications. Kathy Clark will continue her medications and will watch for signs of hypotension as she continues her lifestyle modifications. We will follow up on blood pressure at next appointment. Kathy Clark agrees  to follow up with our clinic in 4 weeks.  Vitamin D Deficiency Kathy Clark was informed that low vitamin D levels contributes to fatigue and are associated with obesity, breast, and colon cancer. Kathy Clark agrees to continue taking prescription Vit D @50 ,000 IU every week, no refill needed. She will follow up for routine testing of vitamin D, at least 2-3 times per year. She was informed of the risk of over-replacement of vitamin D and agrees to not increase her dose unless she discusses this with [place] first. Kathy Clark agrees to follow up with our clinic in 4 weeks.  I spent > than 50% of the 15 minute visit on counseling as documented in the note.  Obesity Kathy Clark is currently in the action stage of change. As such, her goal is to continue with weight loss efforts She has agreed to keep a food journal with 1300-1400 calories and 85+ grams of protein daily Kathy Clark has been instructed to work up to a goal of 150 minutes of combined cardio and strengthening exercise per week for weight loss and overall health benefits. We discussed the following Behavioral Modification Strategies today: increasing lean protein intake, increasing vegetables, work on meal planning and easy cooking plans, better snacking choices, planning for success, and keep a strict food journal   Kathy Clark has agreed to follow up with our clinic in 4 weeks. She was informed of the importance of frequent follow up visits to maximize her success with intensive lifestyle modifications for her multiple health conditions.  ALLERGIES:  Allergies  Allergen Reactions  . Succinylcholine Anaphylaxis  . Dilaudid [Hydromorphone Hcl] Nausea And Vomiting    MEDICATIONS: Current Outpatient Medications on File Prior to Visit  Medication Sig Dispense Refill  . albuterol (PROVENTIL HFA;VENTOLIN HFA) 108 (90 Base) MCG/ACT inhaler Inhale 2 puffs into the lungs every 6 (six) hours as needed for wheezing or shortness of breath. 1 Inhaler 0  . AMBULATORY NON FORMULARY  MEDICATION Medication Name: Please decrease CPAP setting to 8 cm of water pressure and fax [place] a download after 5 days.  Is having a lot of difficulty with air leaks is working to try to reduce her pressure to see if she is still being adequately treated but with fewer leaks.FAx to [facility] 1 Units 0  . benzonatate (TESSALON PERLES) 100 MG capsule Take 1 capsule (100 mg total) by mouth 3 (three) times daily as needed. 20 capsule 0  . buPROPion (WELLBUTRIN SR) 150 MG 12 hr tablet Take 1 tablet (150 mg total) by mouth daily. 30 tablet 0  . Calcium Carbonate-Vitamin D (CALTRATE 600+D PO) Take 1 tablet by mouth daily.    . clonazePAM (KLONOPIN) 0.25 MG disintegrating tablet Take 1 tablet (0.25 mg total) by mouth daily as needed. 20 tablet 0  . clotrimazole-betamethasone (LOTRISONE) cream Apply 1 application topically at bedtime as needed. 15 g 0  . estradiol (VIVELLE-DOT) 0.075 MG/24HR   3  . gabapentin (NEURONTIN) 300 MG capsule 2 caps p.o. twice daily for a week then increase dose as needed up to a maximum of 3600 mg daily 180 capsule 3  . hydrochlorothiazide (MICROZIDE) 12.5 MG capsule Take 1 capsule (12.5 mg total) by mouth daily. 90 capsule 3  . hyoscyamine (LEVBID) 0.375 MG 12 hr tablet Take 1 tablet (0.375 mg total) by mouth 2 (two) times daily. 60 tablet 0  . ketoconazole (NIZORAL) 2 % cream Apply 1 application topically daily. To affected areas 30 g 1  . losartan (COZAAR) 25 MG tablet Take 1 tablet (25 mg total) by mouth daily. (Patient taking differently: Take 25 mg by mouth daily. Take 1/2 tab daily) 90 tablet 3  . Melatonin 3 MG TABS Take by mouth as directed.    . Multiple Vitamins-Minerals (MULTIVITAMIN ADULT PO) Take by mouth daily.    . predniSONE (STERAPRED UNI-PAK 21 TAB) 10 MG (21) TBPK tablet Use as directed 21 tablet 0  . pyridOXINE (VITAMIN B-6) 50 MG tablet Take 1 tablet (50 mg total) by mouth daily. 90 tablet 3  . Vilazodone HCl (VIIBRYD) 40 MG TABS Take 1 tablet (40 mg  total) by mouth daily. 90 tablet 1  . Vitamin D, Ergocalciferol, (DRISDOL) 1.25 MG (50000 UT) CAPS capsule Take 1 capsule (50,000 Units total) by mouth every 7 (seven) days. 4 capsule 0   No current facility-administered medications on file prior to visit.     PAST MEDICAL HISTORY: Past Medical History:  Diagnosis Date  . Anxiety   . Back pain   . 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 Depression   . Essential hypertension, benign   . Gastritis   . GERD (gastroesophageal reflux disease)   . Hypertension   . Joint pain   . Lymphocytic colitis    microscopic  . Migraines   . Osteoarthritis   . Sciatica    right leg  . Swelling    feet, left foot    PAST SURGICAL HISTORY: Past Surgical History:  Procedure Laterality Date  . ABDOMINAL HYSTERECTOMY  07-2005  . [place] Bunionectomy Left 09/18/2016   Left Foot  . BACK SURGERY    . KNEE SURGERY  2006   left; multiple  . microdiscetomy  2005  . SHOULDER SURGERY  07-22-09   right  . SKIN CANCER EXCISION     eyelid  . TOTAL KNEE ARTHROPLASTY  07/27/2011   Procedure: TOTAL KNEE ARTHROPLASTY;  Surgeon: Gearlean Alf;  Location: [facility];  Service: Orthopedics;  Laterality: Left;    SOCIAL HISTORY: Social History   Tobacco Use  . Smoking status: Never Smoker  . Smokeless tobacco: Never Used  Substance Use Topics  . Alcohol use: Yes    Comment: occasionally  . Drug use: No    FAMILY HISTORY: Family History  Problem Relation Age of Onset  . Breast cancer Unknown   . Tongue cancer Mother   . Breast cancer Mother   . Anxiety disorder Mother   . Depression Mother   . Heart disease Father   . Hypertension Father   . Hyperlipidemia Father   . Stroke Paternal Grandmother   . Colon cancer Neg Hx     ROS: Review of Systems  Constitutional: Positive for malaise/fatigue and weight loss.  Cardiovascular: Negative for chest pain.       Negative chest pressure  Gastrointestinal: Negative for nausea  and vomiting.  Musculoskeletal:       Negative muscle weakness  Neurological: Negative for headaches.    PHYSICAL EXAM: Blood pressure 132/77, pulse 69, temperature 97.6 F (36.4 C), temperature source Oral, height 5\' 8"  (1.727 m), weight 220 lb (99.8 kg), SpO2 96 %. Body mass index is 33.45 kg/m. Physical Exam Vitals signs reviewed.  Constitutional:      Appearance: Normal appearance. She is obese.  Cardiovascular:     Rate and Rhythm: Normal rate.     Pulses: Normal pulses.  Pulmonary:     Effort: Pulmonary effort is normal.     Breath sounds: Normal breath sounds.  Musculoskeletal: Normal range of motion.  Skin:    General: Skin is warm and dry.  Neurological:     Mental Status: She is alert and oriented to person, place, and time.  Psychiatric:        Mood and Affect: Mood normal.        Behavior: Behavior normal.     RECENT LABS AND TESTS: BMET    Component Value Date/Time   NA 142 08/18/2017 0940   K 4.0 08/18/2017 0940   CL 101 08/18/2017 0940   CO2 25 08/18/2017 0940   GLUCOSE 89 08/18/2017 0940   GLUCOSE 79 03/26/2016 1600   BUN 12 08/18/2017 0940   CREATININE 0.70 08/18/2017 0940   CREATININE 0.61 03/26/2016 1600   CALCIUM 9.4 08/18/2017 0940   GFRNONAA 96 08/18/2017 0940   GFRNONAA >89 03/26/2016 1600   GFRAA 111 08/18/2017 0940   GFRAA >89 03/26/2016 1600   Lab Results  Component Value Date   HGBA1C 5.1 12/01/2016   HGBA1C 4.9 08/03/2016   Lab Results  Component Value Date   INSULIN 5.4 12/01/2016   INSULIN 5.9 08/03/2016   CBC    Component Value Date/Time   WBC WILL FOLLOW 08/18/2017 0940   WBC 6.7 03/26/2016 1600   RBC WILL FOLLOW 08/18/2017 0940   RBC 4.68 03/26/2016 1600   HGB WILL FOLLOW 08/18/2017 0940   HCT WILL FOLLOW 08/18/2017 0940   PLT WILL FOLLOW 08/18/2017 0940   MCV WILL FOLLOW 08/18/2017 0940   MCH WILL  FOLLOW 08/18/2017 0940   MCH 30.6 03/26/2016 1600   MCHC WILL FOLLOW 08/18/2017 0940   MCHC 33.6 03/26/2016 1600     RDW WILL FOLLOW 08/18/2017 0940   LYMPHSABS WILL FOLLOW 08/18/2017 0940   MONOABS 469 03/26/2016 1600   EOSABS WILL FOLLOW 08/18/2017 0940   BASOSABS WILL FOLLOW 08/18/2017 0940   Iron/TIBC/Ferritin/ %Sat    Component Value Date/Time   FERRITIN 142 12/25/2015 0912   Lipid Panel     Component Value Date/Time   CHOL 196 05/02/2018 0943   TRIG 74 05/02/2018 0943   HDL 61 05/02/2018 0943   CHOLHDL 3.0 04/05/2015 0937   VLDL 21 04/05/2015 0937   LDLCALC 120 (H) 05/02/2018 0943   Hepatic Function Panel     Component Value Date/Time   PROT 6.9 08/18/2017 0940   ALBUMIN 4.4 08/18/2017 0940   AST 16 08/18/2017 0940   ALT 22 08/18/2017 0940   ALKPHOS 47 08/18/2017 0940   BILITOT 0.9 08/18/2017 0940   BILIDIR 0.1 12/25/2015 0912   IBILI 0.5 12/25/2015 0912      Component Value Date/Time   TSH 1.470 08/03/2016 1458   TSH 2.09 03/26/2016 1600   TSH 3.10 12/25/2015 0912      OBESITY BEHAVIORAL INTERVENTION VISIT  Today's visit was # 67   Starting weight: 237 lbs Starting date: 08/03/16 Today's weight : 220 lbs Today's date: 08/08/2018 Total lbs lost to date: 50    ASK: We discussed the diagnosis of obesity with Kathy Clark today and Kathy Clark agreed to give [place] permission to discuss obesity behavioral modification therapy today.  ASSESS: Kathy Clark has the diagnosis of obesity and her BMI today is 33.46 Kathy Clark is in the action stage of change   ADVISE: Kathy Clark was educated on the multiple health risks of obesity as well as the benefit of weight loss to improve her health. She was advised of the need for long term treatment and the importance of lifestyle modifications to improve her current health and to decrease her risk of future health problems.  AGREE: Multiple dietary modification options and treatment options were discussed and  Kathy Clark agreed to follow the recommendations documented in the above note.  ARRANGE: Kathy Clark was educated on the importance of frequent  visits to treat obesity as outlined per CMS and USPSTF guidelines and agreed to schedule her next follow up appointment today.  I, Kathy Clark, am acting as transcriptionist for Kathy Qua, MD  I have reviewed the above documentation for accuracy and completeness, and I agree with the above. - Kathy Qua, MD

## 2018-08-11 MED FILL — LOSARTAN POTASSIUM 25 MG TA: 25 | 90 days supply | Qty: 90 | Fill #3

## 2018-08-11 MED FILL — HYDROCHLOROTHIAZIDE 12.5 MG: 12.5 | 90 days supply | Qty: 90 | Fill #3

## 2018-08-12 DIAGNOSIS — G5601 Carpal tunnel syndrome, right upper limb: Secondary | ICD-10-CM | POA: Diagnosis not present

## 2018-08-12 MED FILL — oxyCODONE HCL 5 MG TABS: 5 | 3 days supply | Qty: 10 | Fill #0

## 2018-08-24 DIAGNOSIS — G5601 Carpal tunnel syndrome, right upper limb: Secondary | ICD-10-CM | POA: Diagnosis not present

## 2018-08-24 MED FILL — predniSONE 2.5 MG TABS: 2.5 | 14 days supply | Qty: 14 | Fill #0

## 2018-09-03 ENCOUNTER — Other Ambulatory Visit (INDEPENDENT_AMBULATORY_CARE_PROVIDER_SITE_OTHER): Payer: Self-pay | Admitting: Family Medicine

## 2018-09-03 DIAGNOSIS — F3289 Other specified depressive episodes: Secondary | ICD-10-CM

## 2018-09-05 DIAGNOSIS — Z01419 Encounter for gynecological examination (general) (routine) without abnormal findings: Secondary | ICD-10-CM | POA: Diagnosis not present

## 2018-09-05 DIAGNOSIS — Z6835 Body mass index (BMI) 35.0-35.9, adult: Secondary | ICD-10-CM | POA: Diagnosis not present

## 2018-09-05 DIAGNOSIS — Z803 Family history of malignant neoplasm of breast: Secondary | ICD-10-CM | POA: Diagnosis not present

## 2018-09-05 MED FILL — BUPROPION HCL SR 150 MG TAB: 150 | 30 days supply | Qty: 30 | Fill #0

## 2018-09-05 MED FILL — ESTRADIOL 0.075 MG PATCH: 0.075 | 28 days supply | Qty: 8 | Fill #0

## 2018-09-06 ENCOUNTER — Ambulatory Visit (INDEPENDENT_AMBULATORY_CARE_PROVIDER_SITE_OTHER): Payer: 59 | Admitting: Family Medicine

## 2018-09-06 ENCOUNTER — Encounter (INDEPENDENT_AMBULATORY_CARE_PROVIDER_SITE_OTHER): Payer: Self-pay | Admitting: Family Medicine

## 2018-09-06 VITALS — BP 124/80 | HR 65 | Temp 97.5°F | Ht 68.0 in | Wt 230.0 lb

## 2018-09-06 DIAGNOSIS — Z9189 Other specified personal risk factors, not elsewhere classified: Secondary | ICD-10-CM

## 2018-09-06 DIAGNOSIS — E559 Vitamin D deficiency, unspecified: Secondary | ICD-10-CM

## 2018-09-06 DIAGNOSIS — Z6835 Body mass index (BMI) 35.0-35.9, adult: Secondary | ICD-10-CM | POA: Diagnosis not present

## 2018-09-06 DIAGNOSIS — F3289 Other specified depressive episodes: Secondary | ICD-10-CM | POA: Diagnosis not present

## 2018-09-06 DIAGNOSIS — E66812 Obesity, class 2: Secondary | ICD-10-CM

## 2018-09-06 MED ORDER — BUPROPION HCL ER (SR) 150 MG PO TB12: 150.0000 mg | ORAL_TABLET | Freq: Every day | ORAL | 0 refills | Status: DC

## 2018-09-07 NOTE — Progress Notes (Signed)
Office: [phone]  /  Fax: [phone]   HPI:   Chief Complaint: OBESITY Kathy Clark is here to discuss her progress with her obesity treatment plan. She is on the keep a food journal with 1300-1400 calories and 85+ grams of protein daily and is following her eating plan approximately 75 % of the time. She states she is exercising 0 minutes 0 times per week. Kathy Clark had carpel tunnel surgery a few weeks ago. She moved 1 week after her surgery and had a new grandson as well as filed for divorce. She also switched jobs recently. She is feeling very happy but has had an increase in hunger.  Her weight is 230 lb (104.3 kg) today and has gained 10 pounds since her last visit. She has lost 7 lbs since starting treatment with [place].  Vitamin D Deficiency Kathy Clark has a diagnosis of vitamin D deficiency. She is currently taking prescription Vit D. She notes fatigue and denies nausea, vomiting or muscle weakness.  At risk for osteopenia and osteoporosis Kathy Clark is at higher risk of osteopenia and osteoporosis due to vitamin D deficiency.   Depression with emotional eating behaviors Kathy Clark notes cravings and she struggles with emotional eating, and using food for comfort to the extent that it is negatively impacting her health. She often snacks when she is not hungry. Kathy Clark sometimes feels she is out of control and then feels guilty that she made poor food choices. She has been working on behavior modification techniques to help reduce her emotional eating and has been somewhat successful. She shows no sign of suicidal or homicidal ideations.  Depression screen [facility] 2/9 05/30/2018 12/10/2017 11/02/2017 11/16/2016 08/03/2016  Decreased Interest 2 1 1 2 3   Down, Depressed, Hopeless 1 1 1 2 3   PHQ - 2 Score 3 2 2 4 6   Altered sleeping 3 2 3 3 3   Tired, decreased energy 3 2 1 3 3   Change in appetite 2 1 1 1 3   Feeling bad or failure about yourself  0 1 1 1 3   Trouble concentrating - 1 - 2 2  Moving slowly or  fidgety/restless 0 0 0 1 0  Suicidal thoughts 0 0 0 0 0  PHQ-9 Score 11 9 8 15 20   Difficult doing work/chores Somewhat difficult Somewhat difficult Somewhat difficult - -  Some recent data might be hidden    ASSESSMENT AND PLAN:  Vitamin D deficiency  Other depression - with emotional eating - Plan: buPROPion (WELLBUTRIN SR) 150 MG 12 hr tablet  At risk for osteoporosis  Class 2 severe obesity with serious comorbidity and body mass index (BMI) of 35.0 to 35.9 in adult, unspecified obesity type (HCC)  PLAN:  Vitamin D Deficiency Kathy Clark was informed that low vitamin D levels contributes to fatigue and are associated with obesity, breast, and colon cancer. Laporche agrees to continue taking prescription Vit D @50 ,000 IU every week and will follow up for routine testing of vitamin D, at least 2-3 times per year. She was informed of the risk of over-replacement of vitamin D and agrees to not increase her dose unless she discusses this with [place] first. Kathy Clark agrees to follow up with our clinic in 3 weeks.  At risk for osteopenia and osteoporosis Kathy Clark was given extended (15 minutes) osteoporosis prevention counseling today. Kathy Clark is at risk for osteopenia and osteoporsis due to her vitamin D deficiency. She was encouraged to take her vitamin D and follow her higher calcium diet and increase strengthening exercise to  help strengthen her bones and decrease her risk of osteopenia and osteoporosis.  Depression with Emotional Eating Behaviors We discussed behavior modification techniques today to help Kathy Clark deal with her emotional eating and depression. Kathy Clark agrees to continue taking Wellbutrin SR 150 mg PO q daily #30 and we will refill for 1 month. Kathy Clark agrees to follow up with our clinic in 3 weeks.  Obesity Kathy Clark is currently in the action stage of change. As such, her goal is to continue with weight loss efforts She has agreed to keep a food journal with 1300-1400 calories and 85+ grams of  protein daily and follow the Category 2 plan Kathy Clark will follow Category 2 for 3 days, journaling for 2 days; Breakfast and lunch from Category 2 or journaling for the other 2 days. Tru has been instructed to work up to a goal of 150 minutes of combined cardio and strengthening exercise per week for weight loss and overall health benefits. We discussed the following Behavioral Modification Strategies today: increasing lean protein intake, increasing vegetables, decrease eating out, work on meal planning and easy cooking plans, planning for success, and keep a strict food journal   Kathy Clark has agreed to follow up with our clinic in 3 weeks. She was informed of the importance of frequent follow up visits to maximize her success with intensive lifestyle modifications for her multiple health conditions.  ALLERGIES: Allergies  Allergen Reactions  . Succinylcholine Anaphylaxis  . Dilaudid [Hydromorphone Hcl] Nausea And Vomiting    MEDICATIONS: Current Outpatient Medications on File Prior to Visit  Medication Sig Dispense Refill  . albuterol (PROVENTIL HFA;VENTOLIN HFA) 108 (90 Base) MCG/ACT inhaler Inhale 2 puffs into the lungs every 6 (six) hours as needed for wheezing or shortness of breath. 1 Inhaler 0  . AMBULATORY NON FORMULARY MEDICATION Medication Name: Please decrease CPAP setting to 8 cm of water pressure and fax [place] a download after 5 days.  Is having a lot of difficulty with air leaks is working to try to reduce her pressure to see if she is still being adequately treated but with fewer leaks.FAx to [facility] 1 Units 0  . benzonatate (TESSALON PERLES) 100 MG capsule Take 1 capsule (100 mg total) by mouth 3 (three) times daily as needed. 20 capsule 0  . Calcium Carbonate-Vitamin D (CALTRATE 600+D PO) Take 1 tablet by mouth daily.    . clonazePAM (KLONOPIN) 0.25 MG disintegrating tablet Take 1 tablet (0.25 mg total) by mouth daily as needed. 20 tablet 0  . clotrimazole-betamethasone  (LOTRISONE) cream Apply 1 application topically at bedtime as needed. 15 g 0  . estradiol (VIVELLE-DOT) 0.075 MG/24HR   3  . gabapentin (NEURONTIN) 300 MG capsule 2 caps p.o. twice daily for a week then increase dose as needed up to a maximum of 3600 mg daily 180 capsule 3  . hydrochlorothiazide (MICROZIDE) 12.5 MG capsule Take 1 capsule (12.5 mg total) by mouth daily. 90 capsule 3  . hyoscyamine (LEVBID) 0.375 MG 12 hr tablet Take 1 tablet (0.375 mg total) by mouth 2 (two) times daily. 60 tablet 0  . ketoconazole (NIZORAL) 2 % cream Apply 1 application topically daily. To affected areas 30 g 1  . losartan (COZAAR) 25 MG tablet Take 1 tablet (25 mg total) by mouth daily. (Patient taking differently: Take 25 mg by mouth daily. Take 1/2 tab daily) 90 tablet 3  . Melatonin 3 MG TABS Take by mouth as directed.    . Multiple Vitamins-Minerals (MULTIVITAMIN ADULT PO)  Take by mouth daily.    . predniSONE (STERAPRED UNI-PAK 21 TAB) 10 MG (21) TBPK tablet Use as directed 21 tablet 0  . pyridOXINE (VITAMIN B-6) 50 MG tablet Take 1 tablet (50 mg total) by mouth daily. 90 tablet 3  . SUMAtriptan (IMITREX) 20 MG/ACT nasal spray PLACE 1 SPRAY INTO THE NOSE EVERY 2 HOURS AS NEEDED FOR. HEADACHE 6 Inhaler 2  . Vilazodone HCl (VIIBRYD) 20 MG TABS Take 1 tablet (20 mg total) by mouth daily. 30 tablet 1  . Vitamin D, Ergocalciferol, (DRISDOL) 1.25 MG (50000 UT) CAPS capsule Take 1 capsule (50,000 Units total) by mouth every 7 (seven) days. 4 capsule 0  . zolpidem (AMBIEN) 10 MG tablet Take 1 tablet (10 mg total) by mouth at bedtime as needed for up to 30 days for sleep. 30 tablet 3   No current facility-administered medications on file prior to visit.     PAST MEDICAL HISTORY: Past Medical History:  Diagnosis Date  . Anxiety   . Back pain   . 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 Depression   . Essential hypertension, benign   . Gastritis   . GERD (gastroesophageal reflux disease)     . Hypertension   . Joint pain   . Lymphocytic colitis    microscopic  . Migraines   . Osteoarthritis   . Sciatica    right leg  . Swelling    feet, left foot    PAST SURGICAL HISTORY: Past Surgical History:  Procedure Laterality Date  . ABDOMINAL HYSTERECTOMY  07-2005  . [place] Bunionectomy Left 09/18/2016   Left Foot  . BACK SURGERY    . KNEE SURGERY  2006   left; multiple  . microdiscetomy  2005  . SHOULDER SURGERY  07-22-09   right  . SKIN CANCER EXCISION     eyelid  . TOTAL KNEE ARTHROPLASTY  07/27/2011   Procedure: TOTAL KNEE ARTHROPLASTY;  Surgeon: Gearlean Alf;  Location: [facility];  Service: Orthopedics;  Laterality: Left;    SOCIAL HISTORY: Social History   Tobacco Use  . Smoking status: Never Smoker  . Smokeless tobacco: Never Used  Substance Use Topics  . Alcohol use: Yes    Comment: occasionally  . Drug use: No    FAMILY HISTORY: Family History  Problem Relation Age of Onset  . Breast cancer Unknown   . Tongue cancer Mother   . Breast cancer Mother   . Anxiety disorder Mother   . Depression Mother   . Heart disease Father   . Hypertension Father   . Hyperlipidemia Father   . Stroke Paternal Grandmother   . Colon cancer Neg Hx     ROS: Review of Systems  Constitutional: Positive for malaise/fatigue. Negative for weight loss.  Gastrointestinal: Negative for nausea and vomiting.  Musculoskeletal:       Negative muscle weakness  Psychiatric/Behavioral: Positive for depression. Negative for suicidal ideas.    PHYSICAL EXAM: Blood pressure 124/80, pulse 65, temperature (!) 97.5 F (36.4 C), temperature source Oral, height 5\' 8"  (1.727 m), weight 230 lb (104.3 kg), SpO2 98 %. Body mass index is 34.97 kg/m. Physical Exam Vitals signs reviewed.  Constitutional:      Appearance: Normal appearance. She is obese.  Cardiovascular:     Rate and Rhythm: Normal rate.     Pulses: Normal pulses.  Pulmonary:     Effort: Pulmonary effort is  normal.     Breath sounds: Normal breath  sounds.  Musculoskeletal: Normal range of motion.  Skin:    General: Skin is warm and dry.  Neurological:     Mental Status: She is alert and oriented to person, place, and time.  Psychiatric:        Mood and Affect: Mood normal.        Behavior: Behavior normal.     RECENT LABS AND TESTS: BMET    Component Value Date/Time   NA 142 08/18/2017 0940   K 4.0 08/18/2017 0940   CL 101 08/18/2017 0940   CO2 25 08/18/2017 0940   GLUCOSE 89 08/18/2017 0940   GLUCOSE 79 03/26/2016 1600   BUN 12 08/18/2017 0940   CREATININE 0.70 08/18/2017 0940   CREATININE 0.61 03/26/2016 1600   CALCIUM 9.4 08/18/2017 0940   GFRNONAA 96 08/18/2017 0940   GFRNONAA >89 03/26/2016 1600   GFRAA 111 08/18/2017 0940   GFRAA >89 03/26/2016 1600   Lab Results  Component Value Date   HGBA1C 5.1 12/01/2016   HGBA1C 4.9 08/03/2016   Lab Results  Component Value Date   INSULIN 5.4 12/01/2016   INSULIN 5.9 08/03/2016   CBC    Component Value Date/Time   WBC WILL FOLLOW 08/18/2017 0940   WBC 6.7 03/26/2016 1600   RBC WILL FOLLOW 08/18/2017 0940   RBC 4.68 03/26/2016 1600   HGB WILL FOLLOW 08/18/2017 0940   HCT WILL FOLLOW 08/18/2017 0940   PLT WILL FOLLOW 08/18/2017 0940   MCV WILL FOLLOW 08/18/2017 0940   MCH WILL FOLLOW 08/18/2017 0940   MCH 30.6 03/26/2016 1600   MCHC WILL FOLLOW 08/18/2017 0940   MCHC 33.6 03/26/2016 1600   RDW WILL FOLLOW 08/18/2017 0940   LYMPHSABS WILL FOLLOW 08/18/2017 0940   MONOABS 469 03/26/2016 1600   EOSABS WILL FOLLOW 08/18/2017 0940   BASOSABS WILL FOLLOW 08/18/2017 0940   Iron/TIBC/Ferritin/ %Sat    Component Value Date/Time   FERRITIN 142 12/25/2015 0912   Lipid Panel     Component Value Date/Time   CHOL 196 05/02/2018 0943   TRIG 74 05/02/2018 0943   HDL 61 05/02/2018 0943   CHOLHDL 3.0 04/05/2015 0937   VLDL 21 04/05/2015 0937   LDLCALC 120 (H) 05/02/2018 0943   Hepatic Function Panel     Component  Value Date/Time   PROT 6.9 08/18/2017 0940   ALBUMIN 4.4 08/18/2017 0940   AST 16 08/18/2017 0940   ALT 22 08/18/2017 0940   ALKPHOS 47 08/18/2017 0940   BILITOT 0.9 08/18/2017 0940   BILIDIR 0.1 12/25/2015 0912   IBILI 0.5 12/25/2015 0912      Component Value Date/Time   TSH 1.470 08/03/2016 1458   TSH 2.09 03/26/2016 1600   TSH 3.10 12/25/2015 0912      OBESITY BEHAVIORAL INTERVENTION VISIT  Today's visit was # 99   Starting weight: 237 lbs Starting date: 08/03/16 Today's weight : 230 lbs  Today's date: 09/06/2018 Total lbs lost to date: 7    09/06/2018  Height 5\' 8"  (1.727 m)  Weight 230 lb (104.3 kg)  BMI (Calculated) 34.98  BLOOD PRESSURE - SYSTOLIC 829  BLOOD PRESSURE - DIASTOLIC 80   Body Fat % 56.2 %  Total Body Water (lbs) 92.8 lbs     ASK: We discussed the diagnosis of obesity with Kathy Clark today and Kathy Clark agreed to give [place] permission to discuss obesity behavioral modification therapy today.  ASSESS: Jazari has the diagnosis of obesity and her BMI today is 34.98 Kathy Clark is  in the action stage of change   ADVISE: Kathy Clark was educated on the multiple health risks of obesity as well as the benefit of weight loss to improve her health. She was advised of the need for long term treatment and the importance of lifestyle modifications to improve her current health and to decrease her risk of future health problems.  AGREE: Multiple dietary modification options and treatment options were discussed and  Kathy Clark agreed to follow the recommendations documented in the above note.  ARRANGE: Kathy Clark was educated on the importance of frequent visits to treat obesity as outlined per CMS and USPSTF guidelines and agreed to schedule her next follow up appointment today.  I, Trixie Dredge, am acting as transcriptionist for Ilene Qua, MD  I have reviewed the above documentation for accuracy and completeness, and I agree with the above. - Ilene Qua,  MD

## 2018-09-08 MED FILL — VIIBRYD 20 MG TABLET: 20 | 30 days supply | Qty: 30 | Fill #1

## 2018-09-17 MED FILL — GABAPENTIN 300 MG CAPSULE: 300 | 45 days supply | Qty: 180 | Fill #1

## 2018-09-21 ENCOUNTER — Other Ambulatory Visit: Payer: Self-pay

## 2018-09-21 ENCOUNTER — Ambulatory Visit (INDEPENDENT_AMBULATORY_CARE_PROVIDER_SITE_OTHER): Payer: 59 | Admitting: Sports Medicine

## 2018-09-21 DIAGNOSIS — G5602 Carpal tunnel syndrome, left upper limb: Secondary | ICD-10-CM

## 2018-09-21 NOTE — Progress Notes (Signed)
Subjective:    CC: Hand numbness  HPI: Rmoni returns, she is post carpal tunnel release on the right.  She has been having more symptoms on the left now, moderate, persistent, localized with radiation to the hand and of the forearm.  Last injection was many months ago.  Symptoms have been worsening over the past few weeks.  I reviewed the past medical history, family history, social history, surgical history, and allergies today and no changes were needed.  Please see the problem list section below in epic for further details.  Past Medical History: Past Medical History:  Diagnosis Date  . Anxiety   . Back pain   . 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 Depression   . Essential hypertension, benign   . Gastritis   . GERD (gastroesophageal reflux disease)   . Hypertension   . Joint pain   . Lymphocytic colitis    microscopic  . Migraines   . Osteoarthritis   . Sciatica    right leg  . Swelling    feet, left foot   Past Surgical History: Past Surgical History:  Procedure Laterality Date  . ABDOMINAL HYSTERECTOMY  07-2005  . [place] Bunionectomy Left 09/18/2016   Left Foot  . BACK SURGERY    . KNEE SURGERY  2006   left; multiple  . microdiscetomy  2005  . SHOULDER SURGERY  07-22-09   right  . SKIN CANCER EXCISION     eyelid  . TOTAL KNEE ARTHROPLASTY  07/27/2011   Procedure: TOTAL KNEE ARTHROPLASTY;  Surgeon: Gearlean Alf;  Location: [facility];  Service: Orthopedics;  Laterality: Left;   Social History: Social History   Socioeconomic History  . Marital status: Legally Separated    Spouse name: Jenny Reichmann  . Number of children: 2  . Years of education: Not on file  . Highest education level: Not on file  Occupational History  . Occupation: Programmer, multimedia: [employer]  . Financial resource strain: Not on file  . Food insecurity:    Worry: Not on file    Inability: Not on file  . Transportation needs:    Medical: Not on file   Non-medical: Not on file  Tobacco Use  . Smoking status: Never Smoker  . Smokeless tobacco: Never Used  Substance and Sexual Activity  . Alcohol use: Yes    Comment: occasionally  . Drug use: No  . 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  Lifestyle  . Physical activity:    Days per week: Not on file    Minutes per session: Not on file  . Stress: Not on file  Relationships  . Social connections:    Talks on phone: Not on file    Gets together: Not on file    Attends religious service: Not on file    Active member of club or organization: Not on file    Attends meetings of clubs or organizations: Not on file    Relationship status: Not on file  Other Topics Concern  . Not on file  Social History Narrative  . Not on file   Family History: Family History  Problem Relation Age of Onset  . Breast cancer Unknown   . Tongue cancer Mother   . Breast cancer Mother   . Anxiety disorder Mother   . Depression Mother   . Heart disease Father   .  Hypertension Father   . Hyperlipidemia Father   . Stroke Paternal Grandmother   . Colon cancer Neg Hx    Allergies: Allergies  Allergen Reactions  . Succinylcholine Anaphylaxis  . Dilaudid [Hydromorphone Hcl] Nausea And Vomiting   Medications: See med rec.  Review of Systems: No fevers, chills, night sweats, weight loss, chest pain, or shortness of breath.   Objective:    General: Well Developed, well nourished, and in no acute distress.  Neuro: Alert and oriented x3, extra-ocular muscles intact, sensation grossly intact.  HEENT: Normocephalic, atraumatic, pupils equal round reactive to light, neck supple, no masses, no lymphadenopathy, thyroid nonpalpable.  Skin: Warm and dry, no rashes. Cardiac: Regular rate and rhythm, no murmurs rubs or gallops, no lower extremity edema.  Respiratory: Clear to auscultation bilaterally. Not using accessory muscles, speaking in full  sentences. Left wrist: Inspection normal with no visible erythema or swelling. ROM smooth and normal with good flexion and extension and ulnar/radial deviation that is symmetrical with opposite wrist. Palpation is normal over metacarpals, navicular, lunate, and TFCC; tendons without tenderness/ swelling No snuffbox tenderness. No tenderness over Canal of [place]. Strength 5/5 in all directions without pain. Positive Tinel's and phalens signs. Negative Finkelstein sign. Negative Watson's test.  Procedure: Real-time Ultrasound Guided hydrodissection of the left median nerve at the carpal tunnel Device: GE Logiq E  Verbal informed consent obtained.  Time-out conducted.  Noted no overlying erythema, induration, or other signs of local infection.  Skin prepped in a sterile fashion.  Local anesthesia: Topical Ethyl chloride.  With sterile technique and under real time ultrasound guidance: Using a 25-gauge needle advanced into the carpal tunnel, taking care to avoid intraneural injection I injected medication both superficial to and deep to the median nerve freeing it from surrounding structures, I then redirected the needle deep and injected further medication around the flexor tendons deep within the carpal tunnel for a total of 1 cc kenalog 40, 5 cc lidocaine. Completed without difficulty  Advised to call if fevers/chills, erythema, induration, drainage, or persistent bleeding.  Images permanently stored and available for review in the ultrasound unit.  Impression: Technically successful ultrasound guided median nerve hydrodissection.  Impression and Recommendations:    Left carpal tunnel syndrome, post right carpal tunnel release Left-sided median nerve Hydro dissection today, return as needed.   ___________________________________________ Gwen Her. Dianah Field, M.D., ABFM., CAQSM. Primary Care and Sports Medicine [facility]  Adjunct Professor of [place]

## 2018-09-21 NOTE — Assessment & Plan Note (Signed)
Left-sided median nerve Hydro dissection today, return as needed.

## 2018-09-27 ENCOUNTER — Ambulatory Visit (INDEPENDENT_AMBULATORY_CARE_PROVIDER_SITE_OTHER): Payer: 59 | Admitting: Family Medicine

## 2018-09-30 MED FILL — VIIBRYD 20 MG TABLET: 20 | 90 days supply | Qty: 90 | Fill #1

## 2018-09-30 MED FILL — ESTRADIOL 0.075 MG PATCH: 0.075 | 28 days supply | Qty: 8 | Fill #1 | Status: TO

## 2018-09-30 MED FILL — BUPROPION HCL SR 150 MG TAB: 150 | 30 days supply | Qty: 30 | Fill #0

## 2018-10-04 MED FILL — VIT D2 1.25 MG (50,000 UNIT: 1.25 MG | 28 days supply | Qty: 4 | Fill #0

## 2018-10-06 ENCOUNTER — Encounter (INDEPENDENT_AMBULATORY_CARE_PROVIDER_SITE_OTHER): Payer: Self-pay

## 2018-10-25 ENCOUNTER — Other Ambulatory Visit (INDEPENDENT_AMBULATORY_CARE_PROVIDER_SITE_OTHER): Payer: Self-pay | Admitting: Family Medicine

## 2018-10-25 DIAGNOSIS — E559 Vitamin D deficiency, unspecified: Secondary | ICD-10-CM

## 2018-10-25 DIAGNOSIS — F3289 Other specified depressive episodes: Secondary | ICD-10-CM

## 2018-10-26 ENCOUNTER — Encounter (INDEPENDENT_AMBULATORY_CARE_PROVIDER_SITE_OTHER): Payer: Self-pay

## 2018-10-26 ENCOUNTER — Encounter: Payer: Self-pay | Admitting: Family Medicine

## 2018-10-27 MED FILL — DOTTI 0.075 MG/24HR PTTW: 0.075 | 84 days supply | Qty: 24 | Fill #0

## 2018-10-28 ENCOUNTER — Other Ambulatory Visit: Payer: Self-pay

## 2018-10-28 ENCOUNTER — Other Ambulatory Visit: Payer: Self-pay | Admitting: Family Medicine

## 2018-10-28 MED ORDER — PIMECROLIMUS 1 % EX CREA: TOPICAL_CREAM | Freq: Every day | CUTANEOUS | 0 refills | Status: DC

## 2018-10-28 MED FILL — ELIDEL 1% CREAM: 1 | 30 days supply | Qty: 30 | Fill #0

## 2018-10-28 NOTE — Telephone Encounter (Signed)
Approved today pimecrolimus (ELIDEL)  The request has been approved. The authorization is effective for a maximum of 12 fills from 10/28/2018 to 10/27/2019, as long as the member is enrolled in their current health plan. The request was reviewed and approved by a licensed clinical pharmacist. A written notification letter will follow with additional details.Pharmacy aware.

## 2018-10-28 NOTE — Progress Notes (Signed)
See mhychart note

## 2018-10-28 NOTE — Progress Notes (Signed)
Sent to new pharmacy per Pt request.

## 2018-10-31 NOTE — Telephone Encounter (Signed)
Can we refill her wellbutrin?

## 2018-11-01 ENCOUNTER — Other Ambulatory Visit (INDEPENDENT_AMBULATORY_CARE_PROVIDER_SITE_OTHER): Payer: Self-pay

## 2018-11-01 DIAGNOSIS — F3289 Other specified depressive episodes: Secondary | ICD-10-CM

## 2018-11-01 MED ORDER — BUPROPION HCL ER (SR) 150 MG PO TB12: 150.0000 mg | ORAL_TABLET | Freq: Every day | ORAL | 0 refills | Status: DC

## 2018-11-01 MED FILL — BUPROPION HCL SR 150 MG TAB: 150 | 30 days supply | Qty: 30 | Fill #0

## 2018-11-01 NOTE — Telephone Encounter (Signed)
Done

## 2018-11-01 NOTE — Telephone Encounter (Signed)
Refill wellbutrin

## 2018-11-07 ENCOUNTER — Encounter (INDEPENDENT_AMBULATORY_CARE_PROVIDER_SITE_OTHER): Payer: Self-pay | Admitting: Family Medicine

## 2018-11-07 ENCOUNTER — Other Ambulatory Visit: Payer: Self-pay

## 2018-11-07 ENCOUNTER — Ambulatory Visit (INDEPENDENT_AMBULATORY_CARE_PROVIDER_SITE_OTHER): Payer: 59 | Admitting: Family Medicine

## 2018-11-07 DIAGNOSIS — E66812 Obesity, class 2: Secondary | ICD-10-CM

## 2018-11-07 DIAGNOSIS — Z6835 Body mass index (BMI) 35.0-35.9, adult: Secondary | ICD-10-CM

## 2018-11-07 DIAGNOSIS — I10 Essential (primary) hypertension: Secondary | ICD-10-CM

## 2018-11-07 DIAGNOSIS — E559 Vitamin D deficiency, unspecified: Secondary | ICD-10-CM

## 2018-11-07 MED ORDER — VITAMIN D (ERGOCALCIFEROL) 1.25 MG (50000 UNIT) PO CAPS: 50000.0000 [IU] | ORAL_CAPSULE | ORAL | 0 refills | Status: DC

## 2018-11-07 MED FILL — VIT D2 1.25 MG (50,000 UNIT: 1.25 MG | 28 days supply | Qty: 4 | Fill #0

## 2018-11-08 DIAGNOSIS — G4733 Obstructive sleep apnea (adult) (pediatric): Secondary | ICD-10-CM | POA: Diagnosis not present

## 2018-11-08 NOTE — Progress Notes (Signed)
Office: [phone]  /  Fax: [phone] TeleHealth Visit:  MERSADIES PETREE has verbally consented to this TeleHealth visit today. The patient is located at home, the provider is located at the [facility] office. The participants in this visit include the listed provider and patient. The visit was conducted today via webex.  HPI:   Chief Complaint: OBESITY Novelle is here to discuss her progress with her obesity treatment plan. She is on the keep a food journal with 1300-1400 calories and 85+ grams of protein daily or follow the Category 2 plan and is following her eating plan approximately 50 % of the time. She states she is walking for 20-30 minutes 7 times per week. Virdia has had labile stress in the past few weeks secondary to divorce and the stay at home order. She is using [employer] for groceries and no issues getting meal plan food. She is weighing at home, weight today is 230 lbs.  We were unable to weigh the patient today for this TeleHealth visit. She feels as if she has gained weight since her last visit. She has lost 7 lbs since starting treatment with [place].  Vitamin D Deficiency Tailey has a diagnosis of vitamin D deficiency. She is currently taking prescription Vit D. She fatigue and denies nausea, vomiting or muscle weakness.  Hypertension BECCI BATTY is a 58 y.o. female with hypertension. Houa's blood pressure was previously well controlled. She denies chest pain, chest pressure, or headaches. She is working on weight loss to help control her blood pressure with the goal of decreasing her risk of heart attack and stroke.   ASSESSMENT AND PLAN:  Essential hypertension  Vitamin D deficiency - Plan: Vitamin D, Ergocalciferol, (DRISDOL) 1.25 MG (50000 UT) CAPS capsule  Class 2 severe obesity with serious comorbidity and body mass index (BMI) of 35.0 to 35.9 in adult, unspecified obesity type ([place])  PLAN:  Vitamin D Deficiency Megumi was informed that low  vitamin D levels contributes to fatigue and are associated with obesity, breast, and colon cancer. Yina agrees to continue taking prescription Vit D @50 ,000 IU every week #4 and we will refill for 1 month. She will follow up for routine testing of vitamin D, at least 2-3 times per year. She was informed of the risk of over-replacement of vitamin D and agrees to not increase her dose unless she discusses this with [place] first. Michiah agrees to follow up with our clinic in 2 weeks.  Hypertension We discussed sodium restriction, working on healthy weight loss, and a regular exercise program as the means to achieve improved blood pressure control. Joann agreed with this plan and agreed to follow up as directed. We will continue to monitor her blood pressure as well as her progress with the above lifestyle modifications. Cionna agrees to continue her current medications and will watch for signs of hypotension as she continues her lifestyle modifications. Elliotte agrees to follow up with our clinic in 2 weeks.  Obesity Samaa is currently in the action stage of change. As such, her goal is to continue with weight loss efforts She has agreed to follow the Category 2 plan Ivett has been instructed to work up to a goal of 150 minutes of combined cardio and strengthening exercise per week for weight loss and overall health benefits. We discussed the following Behavioral Modification Strategies today: increasing lean protein intake, increasing vegetables, work on meal planning and easy cooking plans and emotional eating strategies, better snacking choices, and  planning for success   Lexani has agreed to follow up with our clinic in 2 weeks. She was informed of the importance of frequent follow up visits to maximize her success with intensive lifestyle modifications for her multiple health conditions.  ALLERGIES: Allergies  Allergen Reactions  . Succinylcholine Anaphylaxis  . Dilaudid [Hydromorphone Hcl] Nausea And  Vomiting    MEDICATIONS: Current Outpatient Medications on File Prior to Visit  Medication Sig Dispense Refill  . albuterol (PROVENTIL HFA;VENTOLIN HFA) 108 (90 Base) MCG/ACT inhaler Inhale 2 puffs into the lungs every 6 (six) hours as needed for wheezing or shortness of breath. 1 Inhaler 0  . AMBULATORY NON FORMULARY MEDICATION Medication Name: Please decrease CPAP setting to 8 cm of water pressure and fax [place] a download after 5 days.  Is having a lot of difficulty with air leaks is working to try to reduce her pressure to see if she is still being adequately treated but with fewer leaks.FAx to [place] 1 Units 0  . benzonatate (TESSALON PERLES) 100 MG capsule Take 1 capsule (100 mg total) by mouth 3 (three) times daily as needed. 20 capsule 0  . buPROPion (WELLBUTRIN SR) 150 MG 12 hr tablet Take 1 tablet (150 mg total) by mouth daily. 30 tablet 0  . Calcium Carbonate-Vitamin D (CALTRATE 600+D PO) Take 1 tablet by mouth daily.    . clonazePAM (KLONOPIN) 0.25 MG disintegrating tablet Take 1 tablet (0.25 mg total) by mouth daily as needed. 20 tablet 0  . clotrimazole-betamethasone (LOTRISONE) cream Apply 1 application topically at bedtime as needed. 15 g 0  . estradiol (VIVELLE-DOT) 0.075 MG/24HR   3  . gabapentin (NEURONTIN) 300 MG capsule 2 caps p.o. twice daily for a week then increase dose as needed up to a maximum of 3600 mg daily 180 capsule 3  . hydrochlorothiazide (MICROZIDE) 12.5 MG capsule Take 1 capsule (12.5 mg total) by mouth daily. 90 capsule 3  . hyoscyamine (LEVBID) 0.375 MG 12 hr tablet Take 1 tablet (0.375 mg total) by mouth 2 (two) times daily. 60 tablet 0  . ketoconazole (NIZORAL) 2 % cream Apply 1 application topically daily. To affected areas 30 g 1  . losartan (COZAAR) 25 MG tablet Take 1 tablet (25 mg total) by mouth daily. (Patient taking differently: Take 25 mg by mouth daily. Take 1/2 tab daily) 90 tablet 3  . Melatonin 3 MG TABS Take by mouth as directed.    .  Multiple Vitamins-Minerals (MULTIVITAMIN ADULT PO) Take by mouth daily.    . pimecrolimus (ELIDEL) 1 % cream Apply topically at bedtime. 30 g 0  . predniSONE (STERAPRED UNI-PAK 21 TAB) 10 MG (21) TBPK tablet Use as directed 21 tablet 0  . pyridOXINE (VITAMIN B-6) 50 MG tablet Take 1 tablet (50 mg total) by mouth daily. 90 tablet 3  . SUMAtriptan (IMITREX) 20 MG/ACT nasal spray PLACE 1 SPRAY INTO THE NOSE EVERY 2 HOURS AS NEEDED FOR. HEADACHE 6 Inhaler 2  . Vilazodone HCl (VIIBRYD) 20 MG TABS Take 1 tablet (20 mg total) by mouth daily. 30 tablet 1  . zolpidem (AMBIEN) 10 MG tablet Take 1 tablet (10 mg total) by mouth at bedtime as needed for up to 30 days for sleep. 30 tablet 3   No current facility-administered medications on file prior to visit.     PAST MEDICAL HISTORY: Past Medical History:  Diagnosis Date  . Anxiety   . Back pain   . Complication of anesthesia  allergy to Succinylcholine  . Depression   . Essential hypertension, benign   . Gastritis   . GERD (gastroesophageal reflux disease)   . Hypertension   . Joint pain   . Lymphocytic colitis    microscopic  . Migraines   . Osteoarthritis   . Sciatica    right leg  . Swelling    feet, left foot    PAST SURGICAL HISTORY: Past Surgical History:  Procedure Laterality Date  . ABDOMINAL HYSTERECTOMY  07-2005  . [place] Bunionectomy Left 09/18/2016   Left Foot  . BACK SURGERY    . KNEE SURGERY  2006   left; multiple  . microdiscetomy  2005  . SHOULDER SURGERY  07-22-09   right  . SKIN CANCER EXCISION     eyelid  . TOTAL KNEE ARTHROPLASTY  07/27/2011   Procedure: TOTAL KNEE ARTHROPLASTY;  Surgeon: Gearlean Alf;  Location: [facility];  Service: Orthopedics;  Laterality: Left;    SOCIAL HISTORY: Social History   Tobacco Use  . Smoking status: Never Smoker  . Smokeless tobacco: Never Used  Substance Use Topics  . Alcohol use: Yes    Comment: occasionally  . Drug use: No    FAMILY HISTORY: Family History   Problem Relation Age of Onset  . Breast cancer Unknown   . Tongue cancer Mother   . Breast cancer Mother   . Anxiety disorder Mother   . Depression Mother   . Heart disease Father   . Hypertension Father   . Hyperlipidemia Father   . Stroke Paternal Grandmother   . Colon cancer Neg Hx     ROS: Review of Systems  Constitutional: Positive for malaise/fatigue. Negative for weight loss.  Cardiovascular: Negative for chest pain.       Negative chest pressure  Gastrointestinal: Negative for nausea and vomiting.  Musculoskeletal:       Negative muscle weakness  Neurological: Negative for headaches.    PHYSICAL EXAM: Pt in no acute distress  RECENT LABS AND TESTS: BMET    Component Value Date/Time   NA 142 08/18/2017 0940   K 4.0 08/18/2017 0940   CL 101 08/18/2017 0940   CO2 25 08/18/2017 0940   GLUCOSE 89 08/18/2017 0940   GLUCOSE 79 03/26/2016 1600   BUN 12 08/18/2017 0940   CREATININE 0.70 08/18/2017 0940   CREATININE 0.61 03/26/2016 1600   CALCIUM 9.4 08/18/2017 0940   GFRNONAA 96 08/18/2017 0940   GFRNONAA >89 03/26/2016 1600   GFRAA 111 08/18/2017 0940   GFRAA >89 03/26/2016 1600   Lab Results  Component Value Date   HGBA1C 5.1 12/01/2016   HGBA1C 4.9 08/03/2016   Lab Results  Component Value Date   INSULIN 5.4 12/01/2016   INSULIN 5.9 08/03/2016   CBC    Component Value Date/Time   WBC WILL FOLLOW 08/18/2017 0940   WBC 6.7 03/26/2016 1600   RBC WILL FOLLOW 08/18/2017 0940   RBC 4.68 03/26/2016 1600   HGB WILL FOLLOW 08/18/2017 0940   HCT WILL FOLLOW 08/18/2017 0940   PLT WILL FOLLOW 08/18/2017 0940   MCV WILL FOLLOW 08/18/2017 0940   MCH WILL FOLLOW 08/18/2017 0940   MCH 30.6 03/26/2016 1600   MCHC WILL FOLLOW 08/18/2017 0940   MCHC 33.6 03/26/2016 1600   RDW WILL FOLLOW 08/18/2017 0940   LYMPHSABS WILL FOLLOW 08/18/2017 0940   MONOABS 469 03/26/2016 1600   EOSABS WILL FOLLOW 08/18/2017 0940   BASOSABS WILL FOLLOW 08/18/2017 0940    Iron/TIBC/Ferritin/ %  Sat    Component Value Date/Time   FERRITIN 142 12/25/2015 0912   Lipid Panel     Component Value Date/Time   CHOL 196 05/02/2018 0943   TRIG 74 05/02/2018 0943   HDL 61 05/02/2018 0943   CHOLHDL 3.0 04/05/2015 0937   VLDL 21 04/05/2015 0937   LDLCALC 120 (H) 05/02/2018 0943   Hepatic Function Panel     Component Value Date/Time   PROT 6.9 08/18/2017 0940   ALBUMIN 4.4 08/18/2017 0940   AST 16 08/18/2017 0940   ALT 22 08/18/2017 0940   ALKPHOS 47 08/18/2017 0940   BILITOT 0.9 08/18/2017 0940   BILIDIR 0.1 12/25/2015 0912   IBILI 0.5 12/25/2015 0912      Component Value Date/Time   TSH 1.470 08/03/2016 1458   TSH 2.09 03/26/2016 1600   TSH 3.10 12/25/2015 0912      I, Trixie Dredge, am acting as transcriptionist for Ilene Qua, MD  I have reviewed the above documentation for accuracy and completeness, and I agree with the above. - Ilene Qua, MD

## 2018-11-09 ENCOUNTER — Encounter: Payer: Self-pay | Admitting: Family Medicine

## 2018-11-09 ENCOUNTER — Other Ambulatory Visit: Payer: Self-pay | Admitting: Family Medicine

## 2018-11-09 DIAGNOSIS — I10 Essential (primary) hypertension: Secondary | ICD-10-CM

## 2018-11-10 ENCOUNTER — Other Ambulatory Visit: Payer: Self-pay | Admitting: Family Medicine

## 2018-11-10 MED ORDER — CLONAZEPAM 0.25 MG PO TBDP: 0.2500 mg | ORAL_TABLET | Freq: Every day | ORAL | 0 refills | Status: DC | PRN

## 2018-11-10 MED FILL — HYDROCHLOROTHIAZIDE 12.5 MG: 12.5 | 90 days supply | Qty: 90 | Fill #0

## 2018-11-10 MED FILL — clonazePAM 0.25 MG TBDP: 0.25 | 20 days supply | Qty: 20 | Fill #0

## 2018-11-10 MED FILL — LOSARTAN POTASSIUM 25 MG TA: 25 | 90 days supply | Qty: 90 | Fill #0

## 2018-11-10 NOTE — Telephone Encounter (Signed)
Med refilled. Has appt next mont for 6 mo check

## 2018-11-15 DIAGNOSIS — G5601 Carpal tunnel syndrome, right upper limb: Secondary | ICD-10-CM | POA: Diagnosis not present

## 2018-11-21 ENCOUNTER — Encounter (INDEPENDENT_AMBULATORY_CARE_PROVIDER_SITE_OTHER): Payer: Self-pay | Admitting: Family Medicine

## 2018-11-21 ENCOUNTER — Other Ambulatory Visit: Payer: Self-pay

## 2018-11-21 ENCOUNTER — Ambulatory Visit (INDEPENDENT_AMBULATORY_CARE_PROVIDER_SITE_OTHER): Payer: 59 | Admitting: Family Medicine

## 2018-11-21 DIAGNOSIS — I10 Essential (primary) hypertension: Secondary | ICD-10-CM

## 2018-11-21 DIAGNOSIS — E7849 Other hyperlipidemia: Secondary | ICD-10-CM

## 2018-11-21 DIAGNOSIS — Z6835 Body mass index (BMI) 35.0-35.9, adult: Secondary | ICD-10-CM | POA: Diagnosis not present

## 2018-11-21 DIAGNOSIS — F3289 Other specified depressive episodes: Secondary | ICD-10-CM

## 2018-11-21 MED ORDER — BUPROPION HCL ER (SR) 150 MG PO TB12: 150.0000 mg | ORAL_TABLET | Freq: Every day | ORAL | 0 refills | Status: DC

## 2018-11-21 NOTE — Progress Notes (Signed)
Office: [phone]  /  Fax: [phone] TeleHealth Visit:  Kathy Clark has verbally consented to this TeleHealth visit today. The patient is located at home, the provider is located at the [facility] office. The participants in this visit include the listed provider and patient. The visit was conducted today via webex.  HPI:   Chief Complaint: OBESITY Kathy Clark is here to discuss her progress with her obesity treatment plan. She is on the Category 2 plan and is following her eating plan approximately 50 % of the time. She states she is walking for 30 minutes 3-4 times per week. Kathy Clark is struggling with motivation to get out and exercise. She says after work she doesn't have motivation to get out of the apartment. She is enjoying working from home. She is having a hard time getting remotivated to follow the plan strictly.  We were unable to weigh the patient today for this TeleHealth visit. She feels as if she has maintained her weight since her last visit. She has lost 7 lbs since starting treatment with [place].  Hypertension Kathy Clark is a 58 y.o. female with hypertension. Anahita's blood pressure was controlled previously. She denies chest pain, chest pressure, or headaches. She is working on weight loss to help control her blood pressure with the goal of decreasing her risk of heart attack and stroke.   Hyperlipidemia Kathy Clark has hyperlipidemia and has been trying to improve her cholesterol levels with intensive lifestyle modification including a low saturated fat diet, exercise and weight loss. Last LDL was of 120 and HDL of 61. She denies any chest pain, claudication or myalgias.  Depression with Emotional Eating Behaviors Kathy Clark is struggling with emotional eating and using food for comfort to the extent that it is negatively impacting her health. She often snacks when she is not hungry. Kathy Clark sometimes feels she is out of control and then feels guilty that she made  poor food choices. She has been working on behavior modification techniques to help reduce her emotional eating and has been somewhat successful. Her blood pressure was previously controlled. She shows no sign of suicidal or homicidal ideations.  Depression screen Kathy Clark 2/9 05/30/2018 12/10/2017 11/02/2017 11/16/2016 08/03/2016  Decreased Interest 2 1 1 2 3   Down, Depressed, Hopeless 1 1 1 2 3   PHQ - 2 Score 3 2 2 4 6   Altered sleeping 3 2 3 3 3   Tired, decreased energy 3 2 1 3 3   Change in appetite 2 1 1 1 3   Feeling bad or failure about yourself  0 1 1 1 3   Trouble concentrating - 1 - 2 2  Moving slowly or fidgety/restless 0 0 0 1 0  Suicidal thoughts 0 0 0 0 0  PHQ-9 Score 11 9 8 15 20   Difficult doing work/chores Somewhat difficult Somewhat difficult Somewhat difficult - -  Some recent data might be hidden    ASSESSMENT AND PLAN:  Essential hypertension  Other hyperlipidemia  Other depression - with emotional eating - Plan: buPROPion (WELLBUTRIN SR) 150 MG 12 hr tablet  Class 2 severe obesity with serious comorbidity and body mass index (BMI) of 35.0 to 35.9 in adult, unspecified obesity type ([facility])  PLAN:  Hypertension We discussed sodium restriction, working on healthy weight loss, and a regular exercise program as the means to achieve improved blood pressure control. Shelisha agreed with this plan and agreed to follow up as directed. We will continue to monitor her blood pressure as well  as her progress with the above lifestyle modifications. Kathy Clark agrees to continue her current medications and will watch for signs of hypotension as she continues her lifestyle modifications. Kathy Clark agrees to follow up with our clinic in 2 weeks.  Hyperlipidemia Kathy Clark was informed of the American Heart Association Guidelines emphasizing intensive lifestyle modifications as the first line treatment for hyperlipidemia. We discussed many lifestyle modifications today in depth, and Kathy Clark will continue to work  on decreasing saturated fats such as fatty red meat, butter and many fried foods. She will also increase vegetables and lean protein in her diet and continue to work on exercise and weight loss efforts. We will repeat FLP at her first in person appointment. Kathy Clark agrees to follow up with our clinic in 2 weeks.  Depression with Emotional Eating Behaviors We discussed behavior modification techniques today to help Kathy Clark deal with her emotional eating and depression. Kathy Clark agrees to continue taking Wellbutrin SR 150 mg PO daily #30 and we will refill for 1 month. Kathy Clark agrees to follow up with our clinic in 2 weeks.  Obesity Kathy Clark is currently in the action stage of change. As such, her goal is to continue with weight loss efforts She has agreed to follow the Category 2 plan Kathy Clark has been instructed to work up to a goal of 150 minutes of combined cardio and strengthening exercise per week or plan to continue physical activity and try to increase to 5 times per week for weight loss and overall health benefits. We discussed the following Behavioral Modification Strategies today: increasing lean protein intake, increasing vegetables and work on meal planning and easy cooking plans, and keeping healthy foods in the home   Kathy Clark has agreed to follow up with our clinic in 2 weeks. She was informed of the importance of frequent follow up visits to maximize her success with intensive lifestyle modifications for her multiple health conditions.  ALLERGIES: Allergies  Allergen Reactions  . Succinylcholine Anaphylaxis  . Dilaudid [Hydromorphone Hcl] Nausea And Vomiting    MEDICATIONS: Current Outpatient Medications on File Prior to Visit  Medication Sig Dispense Refill  . albuterol (PROVENTIL HFA;VENTOLIN HFA) 108 (90 Base) MCG/ACT inhaler Inhale 2 puffs into the lungs every 6 (six) hours as needed for wheezing or shortness of breath. 1 Inhaler 0  . AMBULATORY NON FORMULARY MEDICATION Medication Name:  Please decrease CPAP setting to 8 cm of water pressure and fax [place] a download after 5 days.  Is having a lot of difficulty with air leaks is working to try to reduce her pressure to see if she is still being adequately treated but with fewer leaks.FAx to [place] 1 Units 0  . benzonatate (TESSALON PERLES) 100 MG capsule Take 1 capsule (100 mg total) by mouth 3 (three) times daily as needed. 20 capsule 0  . buPROPion (WELLBUTRIN SR) 150 MG 12 hr tablet Take 1 tablet (150 mg total) by mouth daily. 30 tablet 0  . Calcium Carbonate-Vitamin D (CALTRATE 600+D PO) Take 1 tablet by mouth daily.    . clonazePAM (KLONOPIN) 0.25 MG disintegrating tablet Take 1 tablet (0.25 mg total) by mouth daily as needed. 20 tablet 0  . clotrimazole-betamethasone (LOTRISONE) cream Apply 1 application topically at bedtime as needed. 15 g 0  . estradiol (VIVELLE-DOT) 0.075 MG/24HR   3  . gabapentin (NEURONTIN) 300 MG capsule 2 caps p.o. twice daily for a week then increase dose as needed up to a maximum of 3600 mg daily 180 capsule 3  .  hydrochlorothiazide (MICROZIDE) 12.5 MG capsule TAKE 1 CAPSULE BY MOUTH DAILY. 90 capsule 3  . hyoscyamine (LEVBID) 0.375 MG 12 hr tablet Take 1 tablet (0.375 mg total) by mouth 2 (two) times daily. 60 tablet 0  . ketoconazole (NIZORAL) 2 % cream Apply 1 application topically daily. To affected areas 30 g 1  . losartan (COZAAR) 25 MG tablet TAKE 1 TABLET BY MOUTH DAILY. 90 tablet 3  . Melatonin 3 MG TABS Take by mouth as directed.    . Multiple Vitamins-Minerals (MULTIVITAMIN ADULT PO) Take by mouth daily.    . pimecrolimus (ELIDEL) 1 % cream Apply topically at bedtime. 30 g 0  . predniSONE (STERAPRED UNI-PAK 21 TAB) 10 MG (21) TBPK tablet Use as directed 21 tablet 0  . pyridOXINE (VITAMIN B-6) 50 MG tablet Take 1 tablet (50 mg total) by mouth daily. 90 tablet 3  . SUMAtriptan (IMITREX) 20 MG/ACT nasal spray PLACE 1 SPRAY INTO THE NOSE EVERY 2 HOURS AS NEEDED FOR. HEADACHE 6 Inhaler 2   . Vilazodone HCl (VIIBRYD) 20 MG TABS Take 1 tablet (20 mg total) by mouth daily. 30 tablet 1  . Vitamin D, Ergocalciferol, (DRISDOL) 1.25 MG (50000 UT) CAPS capsule Take 1 capsule (50,000 Units total) by mouth every 7 (seven) days. 4 capsule 0  . zolpidem (AMBIEN) 10 MG tablet Take 1 tablet (10 mg total) by mouth at bedtime as needed for up to 30 days for sleep. 30 tablet 3   No current facility-administered medications on file prior to visit.     PAST MEDICAL HISTORY: Past Medical History:  Diagnosis Date  . Anxiety   . Back pain   . 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 Depression   . Essential hypertension, benign   . Gastritis   . GERD (gastroesophageal reflux disease)   . Hypertension   . Joint pain   . Lymphocytic colitis    microscopic  . Migraines   . Osteoarthritis   . Sciatica    right leg  . Swelling    feet, left foot    PAST SURGICAL HISTORY: Past Surgical History:  Procedure Laterality Date  . ABDOMINAL HYSTERECTOMY  07-2005  . [place] Bunionectomy Left 09/18/2016   Left Foot  . BACK SURGERY    . KNEE SURGERY  2006   left; multiple  . microdiscetomy  2005  . SHOULDER SURGERY  07-22-09   right  . SKIN CANCER EXCISION     eyelid  . TOTAL KNEE ARTHROPLASTY  07/27/2011   Procedure: TOTAL KNEE ARTHROPLASTY;  Surgeon: Gearlean Alf;  Location: [facility];  Service: Orthopedics;  Laterality: Left;    SOCIAL HISTORY: Social History   Tobacco Use  . Smoking status: Never Smoker  . Smokeless tobacco: Never Used  Substance Use Topics  . Alcohol use: Yes    Comment: occasionally  . Drug use: No    FAMILY HISTORY: Family History  Problem Relation Age of Onset  . Breast cancer Unknown   . Tongue cancer Mother   . Breast cancer Mother   . Anxiety disorder Mother   . Depression Mother   . Heart disease Father   . Hypertension Father   . Hyperlipidemia Father   . Stroke Paternal Grandmother   . Colon cancer Neg Hx     ROS:  Review of Systems  Constitutional: Negative for weight loss.  Cardiovascular: Negative for chest pain and claudication.       Negative chest pressure  Musculoskeletal: Negative for myalgias.  Neurological: Negative for headaches.  Psychiatric/Behavioral: Positive for depression. Negative for suicidal ideas.    PHYSICAL EXAM: Pt in no acute distress  RECENT LABS AND TESTS: BMET    Component Value Date/Time   NA 142 08/18/2017 0940   K 4.0 08/18/2017 0940   CL 101 08/18/2017 0940   CO2 25 08/18/2017 0940   GLUCOSE 89 08/18/2017 0940   GLUCOSE 79 03/26/2016 1600   BUN 12 08/18/2017 0940   CREATININE 0.70 08/18/2017 0940   CREATININE 0.61 03/26/2016 1600   CALCIUM 9.4 08/18/2017 0940   GFRNONAA 96 08/18/2017 0940   GFRNONAA >89 03/26/2016 1600   GFRAA 111 08/18/2017 0940   GFRAA >89 03/26/2016 1600   Lab Results  Component Value Date   HGBA1C 5.1 12/01/2016   HGBA1C 4.9 08/03/2016   Lab Results  Component Value Date   INSULIN 5.4 12/01/2016   INSULIN 5.9 08/03/2016   CBC    Component Value Date/Time   WBC WILL FOLLOW 08/18/2017 0940   WBC 6.7 03/26/2016 1600   RBC WILL FOLLOW 08/18/2017 0940   RBC 4.68 03/26/2016 1600   HGB WILL FOLLOW 08/18/2017 0940   HCT WILL FOLLOW 08/18/2017 0940   PLT WILL FOLLOW 08/18/2017 0940   MCV WILL FOLLOW 08/18/2017 0940   MCH WILL FOLLOW 08/18/2017 0940   MCH 30.6 03/26/2016 1600   MCHC WILL FOLLOW 08/18/2017 0940   MCHC 33.6 03/26/2016 1600   RDW WILL FOLLOW 08/18/2017 0940   LYMPHSABS WILL FOLLOW 08/18/2017 0940   MONOABS 469 03/26/2016 1600   EOSABS WILL FOLLOW 08/18/2017 0940   BASOSABS WILL FOLLOW 08/18/2017 0940   Iron/TIBC/Ferritin/ %Sat    Component Value Date/Time   FERRITIN 142 12/25/2015 0912   Lipid Panel     Component Value Date/Time   CHOL 196 05/02/2018 0943   TRIG 74 05/02/2018 0943   HDL 61 05/02/2018 0943   CHOLHDL 3.0 04/05/2015 0937   VLDL 21 04/05/2015 0937   LDLCALC 120 (H) 05/02/2018 0943    Hepatic Function Panel     Component Value Date/Time   PROT 6.9 08/18/2017 0940   ALBUMIN 4.4 08/18/2017 0940   AST 16 08/18/2017 0940   ALT 22 08/18/2017 0940   ALKPHOS 47 08/18/2017 0940   BILITOT 0.9 08/18/2017 0940   BILIDIR 0.1 12/25/2015 0912   IBILI 0.5 12/25/2015 0912      Component Value Date/Time   TSH 1.470 08/03/2016 1458   TSH 2.09 03/26/2016 1600   TSH 3.10 12/25/2015 0912      I, Trixie Dredge, am acting as transcriptionist for Ilene Qua, MD  I have reviewed the above documentation for accuracy and completeness, and I agree with the above. - Ilene Qua, MD

## 2018-11-23 ENCOUNTER — Encounter: Payer: Self-pay | Admitting: Family Medicine

## 2018-11-25 MED FILL — BUPROPION HCL SR 150 MG TAB: 150 | 30 days supply | Qty: 30 | Fill #0

## 2018-11-28 ENCOUNTER — Encounter: Payer: Self-pay | Admitting: Family Medicine

## 2018-11-28 ENCOUNTER — Ambulatory Visit (INDEPENDENT_AMBULATORY_CARE_PROVIDER_SITE_OTHER): Payer: 59 | Admitting: Family Medicine

## 2018-11-28 VITALS — BP 120/78 | Ht 67.32 in | Wt 230.0 lb

## 2018-11-28 DIAGNOSIS — I10 Essential (primary) hypertension: Secondary | ICD-10-CM | POA: Diagnosis not present

## 2018-11-28 DIAGNOSIS — F5101 Primary insomnia: Secondary | ICD-10-CM

## 2018-11-28 DIAGNOSIS — F418 Other specified anxiety disorders: Secondary | ICD-10-CM | POA: Diagnosis not present

## 2018-11-28 MED ORDER — EPINEPHRINE 0.3 MG/0.3ML IJ SOAJ: 0.3000 mg | INTRAMUSCULAR | 2 refills | Status: DC | PRN

## 2018-11-28 MED FILL — EPINEPHRINE 0.3 MG AUTO-INJ: 0.3 | 2 days supply | Qty: 2 | Fill #0

## 2018-11-28 NOTE — Progress Notes (Signed)
Virtual Visit via telephone note  I connected with Kathy Clark on 11/28/18 at  8:10 AM EDT by a telephone enabled telemedicine application and verified that I am speaking with the correct person using two identifiers.  We attempted to through the virtual platform called oximetry but unable to get the app to work.   I discussed the limitations of evaluation and management by telemedicine and the availability of in person appointments. The patient expressed understanding and agreed to proceed.  Subjective:    CC:   HPI: Hypertension- Pt denies chest pain, SOB, dizziness, or heart palpitations.  Taking meds as directed w/o problems.  Denies medication side effects.  Currently on losartan 25 and hydrochlorothiazide 12.5 mg daily.  Anxious Depression - she is doing OK.  Going through her divorce is still been quite stressful.  Her ex-husband initially filed for an extension for 30 days and then that pushed them into right around the time that the courts closed so the new court date is not set until the end of June to finalize the divorce.  She really just wants to get to the point where she can finalize this.  Follow-up chronic insomnia-using Ambien as needed for bedtime. Hasn't used it much lately.  Using more melatonin.    She also continues to battle a rash around the nasolabial folds.  We have tried topical steroid and even Elidel.  The steroid seems to help but she also does not want to use it long-term.  Past medical history, Surgical history, Family history not pertinant except as noted below, Social history, Allergies, and medications have been entered into the medical record, reviewed, and corrections made.   Review of Systems: No fevers, chills, night sweats, weight loss, chest pain, or shortness of breath.   Objective:    General: Speaking clearly in complete sentences without any shortness of breath.  Alert and oriented x3.  Normal judgment. No apparent acute  distress.    Impression and Recommendations:   HTN - Home BPs well controlled.  F/U in 6 months. CMP ordered.  Date ago when she feels comfortable doing so.  Anxious Depression - she will continue with her current regimen.    Promary insomnia - OK to refill Ambien if the future when needed.    Time spent in non-face-to-face encounter 22 minutes.   I discussed the assessment and treatment plan with the patient. The patient was provided an opportunity to ask questions and all were answered. The patient agreed with the plan and demonstrated an understanding of the instructions.   The patient was advised to call back or seek an in-person evaluation if the symptoms worsen or if the condition fails to improve as anticipated.   Beatrice Lecher, MD

## 2018-11-28 NOTE — Progress Notes (Signed)
Pt has had some vertigo for past few days and she stated that it is positional. Questions whether or not this is her sinuses/allergies. She notices it when she is laying down switching positions. She had a prescription for meclizine but this has expired and wondered if she should just pick up something OTC?[name]..Elouise Munroe, [place]

## 2018-12-06 ENCOUNTER — Encounter (INDEPENDENT_AMBULATORY_CARE_PROVIDER_SITE_OTHER): Payer: Self-pay | Admitting: Family Medicine

## 2018-12-06 ENCOUNTER — Ambulatory Visit (INDEPENDENT_AMBULATORY_CARE_PROVIDER_SITE_OTHER): Payer: 59 | Admitting: Family Medicine

## 2018-12-06 ENCOUNTER — Other Ambulatory Visit: Payer: Self-pay

## 2018-12-06 DIAGNOSIS — E669 Obesity, unspecified: Secondary | ICD-10-CM

## 2018-12-06 DIAGNOSIS — Z6834 Body mass index (BMI) 34.0-34.9, adult: Secondary | ICD-10-CM | POA: Diagnosis not present

## 2018-12-06 DIAGNOSIS — E559 Vitamin D deficiency, unspecified: Secondary | ICD-10-CM

## 2018-12-06 DIAGNOSIS — E7849 Other hyperlipidemia: Secondary | ICD-10-CM

## 2018-12-06 NOTE — Progress Notes (Signed)
Office: [phone]  /  Fax: [phone] TeleHealth Visit:  Kathy Clark has verbally consented to this TeleHealth visit today. The patient is located at home, the provider is located at the [facility] office. The participants in this visit include the listed provider and patient. The visit was conducted today via webex.  HPI:   Chief Complaint: OBESITY Kathy Clark is here to discuss her progress with her obesity treatment plan. She is on the Category 2 plan and is following her eating plan approximately 75 % of the time. She states she is walking for 30 minutes 2 times per week and doing step aerobics for 20 minutes 3 times per week. Kathy Clark is doing the category plan every other day. She went out of town with her kids this past weekend. She is still working from home. She has increased her physical activity secondary to being at home. She states her blood pressure was 120/78 at her Orthopedic office on 11/28/2018.  We were unable to weigh the patient today for this TeleHealth visit. She feels as if she has lost 1 lb since her last visit. She has lost 7-8 lbs since starting treatment with [place].  Vitamin D Deficiency Kathy Clark has a diagnosis of vitamin D deficiency. She is currently taking prescription Vit D. She notes fatigue and denies nausea, vomiting or muscle weakness.  Hyperlipidemia Kathy Clark has hyperlipidemia and has been trying to improve her cholesterol levels with intensive lifestyle modification including a low saturated fat diet, exercise and weight loss. Last LDL was of 120 and she is not on statin. She denies any chest pain, claudication or myalgias.  ASSESSMENT AND PLAN:  Vitamin D deficiency - Plan: VITAMIN D 25 Hydroxy (Vit-D Deficiency, Fractures), Vitamin D, Ergocalciferol, (DRISDOL) 1.25 MG (50000 UT) CAPS capsule  Other hyperlipidemia - Plan: Lipid Panel With LDL/HDL Ratio  Class 1 obesity with serious comorbidity and body mass index (BMI) of 34.0 to 34.9 in  adult, unspecified obesity type  PLAN:  Vitamin D Deficiency Kathy Clark was informed that low vitamin D levels contributes to fatigue and are associated with obesity, breast, and colon cancer. Kathy Clark agrees to continue taking prescription Vit D @50 ,000 IU every week #4 and we will refill for 1 month. She will follow up for routine testing of vitamin D, at least 2-3 times per year. She was informed of the risk of over-replacement of vitamin D and agrees to not increase her dose unless she discusses this with [place] first. We will check Vit D level today. Kathy Clark agrees to follow up with our clinic in 2 weeks.  Hyperlipidemia Kathy Clark was informed of the American Heart Association Guidelines emphasizing intensive lifestyle modifications as the first line treatment for hyperlipidemia. We discussed many lifestyle modifications today in depth, and [place] will continue to work on decreasing saturated fats such as fatty red meat, butter and many fried foods. She will also increase vegetables and lean protein in her diet and continue to work on exercise and weight loss efforts. We will check FLP today. Kathy Clark agrees to follow up with our clinic in 2 weeks.  Obesity Kathy Clark is currently in the action stage of change. As such, her goal is to continue with weight loss efforts She has agreed to keep a food journal with 1400 calories and 90 grams of protein daily or follow the Category 2 plan Kathy Clark has been instructed to work up to a goal of 150 minutes of combined cardio and strengthening exercise per week for weight loss  and overall health benefits. We discussed the following Behavioral Modification Strategies today: increasing lean protein intake, increasing vegetables and work on meal planning and easy cooking plans, keeping healthy foods in the home, and planning for success   Kathy Clark has agreed to follow up with our clinic in 2 weeks. She was informed of the importance of frequent follow up visits to maximize her success with  intensive lifestyle modifications for her multiple health conditions.  ALLERGIES: Allergies  Allergen Reactions  . Succinylcholine Anaphylaxis  . Dilaudid [Hydromorphone Hcl] Nausea And Vomiting    MEDICATIONS: Current Outpatient Medications on File Prior to Visit  Medication Sig Dispense Refill  . albuterol (PROVENTIL HFA;VENTOLIN HFA) 108 (90 Base) MCG/ACT inhaler Inhale 2 puffs into the lungs every 6 (six) hours as needed for wheezing or shortness of breath. 1 Inhaler 0  . AMBULATORY NON FORMULARY MEDICATION Medication Name: Please decrease CPAP setting to 8 cm of water pressure and fax [place] a download after 5 days.  Is having a lot of difficulty with air leaks is working to try to reduce her pressure to see if she is still being adequately treated but with fewer leaks.FAx to [address] 1 Units 0  . buPROPion (WELLBUTRIN SR) 150 MG 12 hr tablet Take 1 tablet (150 mg total) by mouth daily. 30 tablet 0  . Calcium Carbonate-Vitamin D (CALTRATE 600+D PO) Take 1 tablet by mouth daily.    . clonazePAM (KLONOPIN) 0.25 MG disintegrating tablet Take 1 tablet (0.25 mg total) by mouth daily as needed. 20 tablet 0  . clotrimazole-betamethasone (LOTRISONE) cream Apply 1 application topically at bedtime as needed. 15 g 0  . EPINEPHrine 0.3 mg/0.3 mL IJ SOAJ injection Inject 0.3 mLs (0.3 mg total) into the muscle as needed for anaphylaxis. 1 Device 2  . estradiol (VIVELLE-DOT) 0.075 MG/24HR   3  . gabapentin (NEURONTIN) 300 MG capsule 2 caps p.o. twice daily for a week then increase dose as needed up to a maximum of 3600 mg daily 180 capsule 3  . hydrochlorothiazide (MICROZIDE) 12.5 MG capsule TAKE 1 CAPSULE BY MOUTH DAILY. 90 capsule 3  . hyoscyamine (LEVBID) 0.375 MG 12 hr tablet Take 1 tablet (0.375 mg total) by mouth 2 (two) times daily. 60 tablet 0  . losartan (COZAAR) 25 MG tablet TAKE 1 TABLET BY MOUTH DAILY. 90 tablet 3  . Melatonin 3 MG TABS Take by mouth as directed.    . Multiple  Vitamins-Minerals (MULTIVITAMIN ADULT PO) Take by mouth daily.    . pimecrolimus (ELIDEL) 1 % cream Apply topically at bedtime. 30 g 0  . SUMAtriptan (IMITREX) 20 MG/ACT nasal spray PLACE 1 SPRAY INTO THE NOSE EVERY 2 HOURS AS NEEDED FOR. HEADACHE 6 Inhaler 2  . Vilazodone HCl (VIIBRYD) 20 MG TABS Take 1 tablet (20 mg total) by mouth daily. 30 tablet 1  . Vitamin D, Ergocalciferol, (DRISDOL) 1.25 MG (50000 UT) CAPS capsule Take 1 capsule (50,000 Units total) by mouth every 7 (seven) days. 4 capsule 0   No current facility-administered medications on file prior to visit.     PAST MEDICAL HISTORY: Past Medical History:  Diagnosis Date  . Anxiety   . Back pain   . 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 Depression   . Essential hypertension, benign   . Gastritis   . GERD (gastroesophageal reflux disease)   . Hypertension   . Joint pain   . Lymphocytic colitis    microscopic  .  Migraines   . Osteoarthritis   . Sciatica    right leg  . Swelling    feet, left foot    PAST SURGICAL HISTORY: Past Surgical History:  Procedure Laterality Date  . ABDOMINAL HYSTERECTOMY  07-2005  . [place] Bunionectomy Left 09/18/2016   Left Foot  . BACK SURGERY    . KNEE SURGERY  2006   left; multiple  . microdiscetomy  2005  . SHOULDER SURGERY  07-22-09   right  . SKIN CANCER EXCISION     eyelid  . TOTAL KNEE ARTHROPLASTY  07/27/2011   Procedure: TOTAL KNEE ARTHROPLASTY;  Surgeon: Gearlean Alf;  Location: [facility];  Service: Orthopedics;  Laterality: Left;    SOCIAL HISTORY: Social History   Tobacco Use  . Smoking status: Never Smoker  . Smokeless tobacco: Never Used  Substance Use Topics  . Alcohol use: Yes    Comment: occasionally  . Drug use: No    FAMILY HISTORY: Family History  Problem Relation Age of Onset  . Breast cancer Unknown   . Tongue cancer Mother   . Breast cancer Mother   . Anxiety disorder Mother   . Depression Mother   . Heart disease  Father   . Hypertension Father   . Hyperlipidemia Father   . Stroke Paternal Grandmother   . Colon cancer Neg Hx     ROS: Review of Systems  Constitutional: Positive for malaise/fatigue and weight loss.  Cardiovascular: Negative for chest pain and claudication.  Gastrointestinal: Negative for nausea and vomiting.  Musculoskeletal: Negative for myalgias.       Negative muscle weakness    PHYSICAL EXAM: Pt in no acute distress  RECENT LABS AND TESTS: BMET    Component Value Date/Time   NA 142 08/18/2017 0940   K 4.0 08/18/2017 0940   CL 101 08/18/2017 0940   CO2 25 08/18/2017 0940   GLUCOSE 89 08/18/2017 0940   GLUCOSE 79 03/26/2016 1600   BUN 12 08/18/2017 0940   CREATININE 0.70 08/18/2017 0940   CREATININE 0.61 03/26/2016 1600   CALCIUM 9.4 08/18/2017 0940   GFRNONAA 96 08/18/2017 0940   GFRNONAA >89 03/26/2016 1600   GFRAA 111 08/18/2017 0940   GFRAA >89 03/26/2016 1600   Lab Results  Component Value Date   HGBA1C 5.1 12/01/2016   HGBA1C 4.9 08/03/2016   Lab Results  Component Value Date   INSULIN 5.4 12/01/2016   INSULIN 5.9 08/03/2016   CBC    Component Value Date/Time   WBC WILL FOLLOW 08/18/2017 0940   WBC 6.7 03/26/2016 1600   RBC WILL FOLLOW 08/18/2017 0940   RBC 4.68 03/26/2016 1600   HGB WILL FOLLOW 08/18/2017 0940   HCT WILL FOLLOW 08/18/2017 0940   PLT WILL FOLLOW 08/18/2017 0940   MCV WILL FOLLOW 08/18/2017 0940   MCH WILL FOLLOW 08/18/2017 0940   MCH 30.6 03/26/2016 1600   MCHC WILL FOLLOW 08/18/2017 0940   MCHC 33.6 03/26/2016 1600   RDW WILL FOLLOW 08/18/2017 0940   LYMPHSABS WILL FOLLOW 08/18/2017 0940   MONOABS 469 03/26/2016 1600   EOSABS WILL FOLLOW 08/18/2017 0940   BASOSABS WILL FOLLOW 08/18/2017 0940   Iron/TIBC/Ferritin/ %Sat    Component Value Date/Time   FERRITIN 142 12/25/2015 0912   Lipid Panel     Component Value Date/Time   CHOL 196 05/02/2018 0943   TRIG 74 05/02/2018 0943   HDL 61 05/02/2018 0943   CHOLHDL  3.0 04/05/2015 0937   VLDL 21 04/05/2015  0375   LDLCALC 120 (H) 05/02/2018 0943   Hepatic Function Panel     Component Value Date/Time   PROT 6.9 08/18/2017 0940   ALBUMIN 4.4 08/18/2017 0940   AST 16 08/18/2017 0940   ALT 22 08/18/2017 0940   ALKPHOS 47 08/18/2017 0940   BILITOT 0.9 08/18/2017 0940   BILIDIR 0.1 12/25/2015 0912   IBILI 0.5 12/25/2015 0912      Component Value Date/Time   TSH 1.470 08/03/2016 1458   TSH 2.09 03/26/2016 1600   TSH 3.10 12/25/2015 0912      I, Trixie Dredge, am acting as transcriptionist for Ilene Qua, MD  I have reviewed the above documentation for accuracy and completeness, and I agree with the above. - Ilene Qua, MD

## 2018-12-07 MED ORDER — VITAMIN D (ERGOCALCIFEROL) 1.25 MG (50000 UNIT) PO CAPS: 50000.0000 [IU] | ORAL_CAPSULE | ORAL | 0 refills | Status: DC

## 2018-12-07 MED FILL — VIT D2 1.25 MG (50,000 UNIT: 1.25 MG | 28 days supply | Qty: 4 | Fill #0

## 2018-12-12 DIAGNOSIS — I10 Essential (primary) hypertension: Secondary | ICD-10-CM | POA: Diagnosis not present

## 2018-12-13 LAB — COMPLETE METABOLIC PANEL WITH GFR
AG Ratio: 1.9 (calc) (ref 1.0–2.5)
ALT: 20 U/L (ref 6–29)
AST: 17 U/L (ref 10–35)
Albumin: 4.4 g/dL (ref 3.6–5.1)
Alkaline phosphatase (APISO): 50 U/L (ref 37–153)
BUN: 16 mg/dL (ref 7–25)
CO2: 27 mmol/L (ref 20–32)
Calcium: 9.1 mg/dL (ref 8.6–10.4)
Chloride: 102 mmol/L (ref 98–110)
Creat: 0.78 mg/dL (ref 0.50–1.05)
GFR, Est African American: 97 mL/min/{1.73_m2} (ref >=60)
GFR, Est Non African American: 84 mL/min/{1.73_m2} (ref >=60)
Globulin: 2.3 g/dL (calc) (ref 1.9–3.7)
Glucose, Bld: 86 mg/dL (ref 65–99)
Potassium: 4.5 mmol/L (ref 3.5–5.3)
Sodium: 139 mmol/L (ref 135–146)
Total Bilirubin: 0.7 mg/dL (ref 0.2–1.2)
Total Protein: 6.7 g/dL (ref 6.1–8.1)

## 2018-12-13 NOTE — Progress Notes (Signed)
All labs are normal. 

## 2018-12-15 ENCOUNTER — Encounter (INDEPENDENT_AMBULATORY_CARE_PROVIDER_SITE_OTHER): Payer: Self-pay | Admitting: Family Medicine

## 2018-12-15 NOTE — Telephone Encounter (Signed)
Please review

## 2018-12-16 DIAGNOSIS — H1045 Other chronic allergic conjunctivitis: Secondary | ICD-10-CM | POA: Diagnosis not present

## 2018-12-16 DIAGNOSIS — H5213 Myopia, bilateral: Secondary | ICD-10-CM | POA: Diagnosis not present

## 2018-12-16 DIAGNOSIS — H52223 Regular astigmatism, bilateral: Secondary | ICD-10-CM | POA: Diagnosis not present

## 2018-12-16 DIAGNOSIS — H524 Presbyopia: Secondary | ICD-10-CM | POA: Diagnosis not present

## 2018-12-16 DIAGNOSIS — H40013 Open angle with borderline findings, low risk, bilateral: Secondary | ICD-10-CM | POA: Diagnosis not present

## 2018-12-20 ENCOUNTER — Encounter (INDEPENDENT_AMBULATORY_CARE_PROVIDER_SITE_OTHER): Payer: Self-pay | Admitting: Family Medicine

## 2018-12-20 ENCOUNTER — Ambulatory Visit (INDEPENDENT_AMBULATORY_CARE_PROVIDER_SITE_OTHER): Payer: 59 | Admitting: Family Medicine

## 2018-12-20 ENCOUNTER — Other Ambulatory Visit: Payer: Self-pay

## 2018-12-20 DIAGNOSIS — E559 Vitamin D deficiency, unspecified: Secondary | ICD-10-CM | POA: Diagnosis not present

## 2018-12-20 DIAGNOSIS — F3289 Other specified depressive episodes: Secondary | ICD-10-CM

## 2018-12-20 DIAGNOSIS — E669 Obesity, unspecified: Secondary | ICD-10-CM | POA: Diagnosis not present

## 2018-12-20 DIAGNOSIS — Z6834 Body mass index (BMI) 34.0-34.9, adult: Secondary | ICD-10-CM

## 2018-12-20 MED ORDER — BUPROPION HCL ER (SR) 150 MG PO TB12: 150.0000 mg | ORAL_TABLET | Freq: Every day | ORAL | 0 refills | Status: DC

## 2018-12-20 MED ORDER — VITAMIN D (ERGOCALCIFEROL) 1.25 MG (50000 UNIT) PO CAPS: 50000.0000 [IU] | ORAL_CAPSULE | ORAL | 0 refills | Status: DC

## 2018-12-20 MED FILL — BUPROPION HCL ER (SR) 150 M: 150 | 30 days supply | Qty: 30 | Fill #0

## 2018-12-20 NOTE — Progress Notes (Signed)
Office: [phone]  /  Fax: [phone] TeleHealth Visit:  Kathy Clark has verbally consented to this TeleHealth visit today. The patient is located at home, the provider is located at the [employer] [facility] office. The participants in this visit include the listed provider and patient. [name] was unable to use realtime audiovisual technology today and the telehealth visit was conducted via audio only.   HPI:   Chief Complaint: OBESITY Kathy Clark is here to discuss her progress with her obesity treatment plan. She is on the keep a food journal with 1400 calories and 90 grams of protein daily or follow the Category 2 plan and is following her eating plan approximately 50 % of the time. She states she is walking for 30 minutes 3 times per week. [name] is having significant will power issues with getting back on track. She has been snacking more at night, secondary to stress and voices she doesn't feel hungry. She is just cravings carbohydrates at night to help with emotional eating.  We were unable to weigh the patient today for this TeleHealth visit. She feels as if she has gained weight since her last visit. She has lost 8 lbs since starting treatment with [place].  Vitamin D Deficiency [name] has a diagnosis of vitamin D deficiency. She is currently taking prescription Vit D. She notes fatigue and denies nausea, vomiting or muscle weakness.  Depression with Emotional Eating Behaviors [name]'s symptoms are somewhat better controlled with Wellbutrin. [name] struggles with emotional eating and using food for comfort to the extent that it is negatively impacting her health. She often snacks when she is not hungry. [name] sometimes feels she is out of control and then feels guilty that she made poor food choices. She has been working on behavior modification techniques to help reduce her emotional eating and has been somewhat successful. She shows no sign of suicidal or homicidal ideations.   Depression screen [facility] 2/9 11/28/2018 05/30/2018 12/10/2017 11/02/2017 11/16/2016  Decreased Interest 1 2 1 1 2   Down, Depressed, Hopeless 0 1 1 1 2   PHQ - 2 Score 1 3 2 2 4   Altered sleeping 1 3 2 3 3   Tired, decreased energy 1 3 2 1 3   Change in appetite 1 2 1 1 1   Feeling bad or failure about yourself  0 0 1 1 1   Trouble concentrating 0 - 1 - 2  Moving slowly or fidgety/restless 0 0 0 0 1  Suicidal thoughts 0 0 0 0 0  PHQ-9 Score 4 11 9 8 15   Difficult doing work/chores Somewhat difficult Somewhat difficult Somewhat difficult Somewhat difficult -  Some recent data might be hidden    ASSESSMENT AND PLAN:  Vitamin D deficiency - Plan: Vitamin D, Ergocalciferol, (DRISDOL) 1.25 MG (50000 UT) CAPS capsule  Other depression - with emotional eating - Plan: buPROPion (WELLBUTRIN SR) 150 MG 12 hr tablet  Class 1 obesity with serious comorbidity and body mass index (BMI) of 34.0 to 34.9 in adult, unspecified obesity type  PLAN:  Vitamin D Deficiency [name] was informed that low vitamin D levels contributes to fatigue and are associated with obesity, breast, and colon cancer. [name] agrees to continue taking prescription Vit D @50 ,000 IU every week #4 and we will refill for 1 month. She will follow up for routine testing of vitamin D, at least 2-3 times per year. She was informed of the risk of over-replacement of vitamin D and agrees to not increase her dose unless she  discusses this with [place] first. [name] agrees to follow up with our clinic in 2 weeks.  Depression with Emotional Eating Behaviors We discussed behavior modification techniques today to help [name] deal with her emotional eating and depression. [name] agrees to continue taking Wellbutrin SR 150 mg PO daily #30 and we will refill for 1 month. [name] agrees to follow up with our clinic in 2 weeks.  Obesity [name] is currently in the action stage of change. As such, her goal is to continue with weight loss efforts She has agreed to keep a  food journal with 1400 calories and 85+ grams of protein daily or follow the Category 2 plan [name] has been instructed to work up to a goal of 150 minutes of combined cardio and strengthening exercise per week for weight loss and overall health benefits. We discussed the following Behavioral Modification Strategies today: increasing lean protein intake, increasing vegetables and work on meal planning and easy cooking plans, keeping healthy foods in the home, better snacking choices, and planning for success   [name] has agreed to follow up with our clinic in 2 weeks. She was informed of the importance of frequent follow up visits to maximize her success with intensive lifestyle modifications for her multiple health conditions.  ALLERGIES: Allergies  Allergen Reactions  . Succinylcholine Anaphylaxis  . Dilaudid [Hydromorphone Hcl] Nausea And Vomiting    MEDICATIONS: Current Outpatient Medications on File Prior to Visit  Medication Sig Dispense Refill  . albuterol (PROVENTIL HFA;VENTOLIN HFA) 108 (90 Base) MCG/ACT inhaler Inhale 2 puffs into the lungs every 6 (six) hours as needed for wheezing or shortness of breath. 1 Inhaler 0  . AMBULATORY NON FORMULARY MEDICATION Medication Name: Please decrease CPAP setting to 8 cm of water pressure and fax [place] a download after 5 days.  Is having a lot of difficulty with air leaks is working to try to reduce her pressure to see if she is still being adequately treated but with fewer leaks.FAx to [place] 1 Units 0  . Calcium Carbonate-Vitamin D (CALTRATE 600+D PO) Take 1 tablet by mouth daily.    . clonazePAM (KLONOPIN) 0.25 MG disintegrating tablet Take 1 tablet (0.25 mg total) by mouth daily as needed. 20 tablet 0  . clotrimazole-betamethasone (LOTRISONE) cream Apply 1 application topically at bedtime as needed. 15 g 0  . EPINEPHrine 0.3 mg/0.3 mL IJ SOAJ injection Inject 0.3 mLs (0.3 mg total) into the muscle as needed for anaphylaxis. 1 Device 2   . estradiol (VIVELLE-DOT) 0.075 MG/24HR   3  . gabapentin (NEURONTIN) 300 MG capsule 2 caps p.o. twice daily for a week then increase dose as needed up to a maximum of 3600 mg daily 180 capsule 3  . hydrochlorothiazide (MICROZIDE) 12.5 MG capsule TAKE 1 CAPSULE BY MOUTH DAILY. 90 capsule 3  . hyoscyamine (LEVBID) 0.375 MG 12 hr tablet Take 1 tablet (0.375 mg total) by mouth 2 (two) times daily. 60 tablet 0  . losartan (COZAAR) 25 MG tablet TAKE 1 TABLET BY MOUTH DAILY. 90 tablet 3  . Melatonin 3 MG TABS Take by mouth as directed.    . Multiple Vitamins-Minerals (MULTIVITAMIN ADULT PO) Take by mouth daily.    . pimecrolimus (ELIDEL) 1 % cream Apply topically at bedtime. 30 g 0  . SUMAtriptan (IMITREX) 20 MG/ACT nasal spray PLACE 1 SPRAY INTO THE NOSE EVERY 2 HOURS AS NEEDED FOR. HEADACHE 6 Inhaler 2  . Vilazodone HCl (VIIBRYD) 20 MG TABS Take 1 tablet (20 mg  total) by mouth daily. 30 tablet 1   No current facility-administered medications on file prior to visit.     PAST MEDICAL HISTORY: Past Medical History:  Diagnosis Date  . Anxiety   . Back pain   . 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 Depression   . Essential hypertension, benign   . Gastritis   . GERD (gastroesophageal reflux disease)   . Hypertension   . Joint pain   . Lymphocytic colitis    microscopic  . Migraines   . Osteoarthritis   . Sciatica    right leg  . Swelling    feet, left foot    PAST SURGICAL HISTORY: Past Surgical History:  Procedure Laterality Date  . ABDOMINAL HYSTERECTOMY  07-2005  . [place] Bunionectomy Left 09/18/2016   Left Foot  . BACK SURGERY    . KNEE SURGERY  2006   left; multiple  . microdiscetomy  2005  . SHOULDER SURGERY  07-22-09   right  . SKIN CANCER EXCISION     eyelid  . TOTAL KNEE ARTHROPLASTY  07/27/2011   Procedure: TOTAL KNEE ARTHROPLASTY;  Surgeon: Gearlean Alf;  Location: [facility];  Service: Orthopedics;  Laterality: Left;    SOCIAL HISTORY:  Social History   Tobacco Use  . Smoking status: Never Smoker  . Smokeless tobacco: Never Used  Substance Use Topics  . Alcohol use: Yes    Comment: occasionally  . Drug use: No    FAMILY HISTORY: Family History  Problem Relation Age of Onset  . Breast cancer Unknown   . Tongue cancer Mother   . Breast cancer Mother   . Anxiety disorder Mother   . Depression Mother   . Heart disease Father   . Hypertension Father   . Hyperlipidemia Father   . Stroke Paternal Grandmother   . Colon cancer Neg Hx     ROS: Review of Systems  Constitutional: Positive for malaise/fatigue. Negative for weight loss.  Gastrointestinal: Negative for nausea and vomiting.  Musculoskeletal:       Negative muscle weakness  Psychiatric/Behavioral: Positive for depression. Negative for suicidal ideas.    PHYSICAL EXAM: Pt in no acute distress  RECENT LABS AND TESTS: BMET    Component Value Date/Time   NA 139 12/12/2018 0823   NA 142 08/18/2017 0940   K 4.5 12/12/2018 0823   CL 102 12/12/2018 0823   CO2 27 12/12/2018 0823   GLUCOSE 86 12/12/2018 0823   BUN 16 12/12/2018 0823   BUN 12 08/18/2017 0940   CREATININE 0.78 12/12/2018 0823   CALCIUM 9.1 12/12/2018 0823   GFRNONAA 84 12/12/2018 0823   GFRAA 97 12/12/2018 0823   Lab Results  Component Value Date   HGBA1C 5.1 12/01/2016   HGBA1C 4.9 08/03/2016   Lab Results  Component Value Date   INSULIN 5.4 12/01/2016   INSULIN 5.9 08/03/2016   CBC    Component Value Date/Time   WBC WILL FOLLOW 08/18/2017 0940   WBC 6.7 03/26/2016 1600   RBC WILL FOLLOW 08/18/2017 0940   RBC 4.68 03/26/2016 1600   HGB WILL FOLLOW 08/18/2017 0940   HCT WILL FOLLOW 08/18/2017 0940   PLT WILL FOLLOW 08/18/2017 0940   MCV WILL FOLLOW 08/18/2017 0940   MCH WILL FOLLOW 08/18/2017 0940   MCH 30.6 03/26/2016 1600   MCHC WILL FOLLOW 08/18/2017 0940   MCHC 33.6 03/26/2016 1600   RDW WILL FOLLOW 08/18/2017 0940   LYMPHSABS WILL FOLLOW 08/18/2017  0940    MONOABS 469 03/26/2016 1600   EOSABS WILL FOLLOW 08/18/2017 0940   BASOSABS WILL FOLLOW 08/18/2017 0940   Iron/TIBC/Ferritin/ %Sat    Component Value Date/Time   FERRITIN 142 12/25/2015 0912   Lipid Panel     Component Value Date/Time   CHOL 196 05/02/2018 0943   TRIG 74 05/02/2018 0943   HDL 61 05/02/2018 0943   CHOLHDL 3.0 04/05/2015 0937   VLDL 21 04/05/2015 0937   LDLCALC 120 (H) 05/02/2018 0943   Hepatic Function Panel     Component Value Date/Time   PROT 6.7 12/12/2018 0823   PROT 6.9 08/18/2017 0940   ALBUMIN 4.4 08/18/2017 0940   AST 17 12/12/2018 0823   ALT 20 12/12/2018 0823   ALKPHOS 47 08/18/2017 0940   BILITOT 0.7 12/12/2018 0823   BILITOT 0.9 08/18/2017 0940   BILIDIR 0.1 12/25/2015 0912   IBILI 0.5 12/25/2015 0912      Component Value Date/Time   TSH 1.470 08/03/2016 1458   TSH 2.09 03/26/2016 1600   TSH 3.10 12/25/2015 0912      I, Trixie Dredge, am acting as transcriptionist for Ilene Qua, MD  I have reviewed the above documentation for accuracy and completeness, and I agree with the above. - Ilene Qua, MD

## 2018-12-21 MED FILL — GABAPENTIN 300 MG CAPSULE: 300 | 45 days supply | Qty: 180 | Fill #0

## 2019-01-05 ENCOUNTER — Other Ambulatory Visit: Payer: Self-pay

## 2019-01-05 ENCOUNTER — Telehealth (INDEPENDENT_AMBULATORY_CARE_PROVIDER_SITE_OTHER): Payer: 59 | Admitting: Family Medicine

## 2019-01-05 DIAGNOSIS — I10 Essential (primary) hypertension: Secondary | ICD-10-CM

## 2019-01-05 DIAGNOSIS — E66812 Obesity, class 2: Secondary | ICD-10-CM

## 2019-01-05 DIAGNOSIS — G5602 Carpal tunnel syndrome, left upper limb: Secondary | ICD-10-CM | POA: Diagnosis not present

## 2019-01-05 DIAGNOSIS — Z6835 Body mass index (BMI) 35.0-35.9, adult: Secondary | ICD-10-CM | POA: Diagnosis not present

## 2019-01-06 ENCOUNTER — Other Ambulatory Visit: Payer: Self-pay | Admitting: Family Medicine

## 2019-01-06 ENCOUNTER — Encounter: Payer: Self-pay | Admitting: Family Medicine

## 2019-01-06 DIAGNOSIS — M4726 Other spondylosis with radiculopathy, lumbar region: Secondary | ICD-10-CM | POA: Diagnosis not present

## 2019-01-06 DIAGNOSIS — M5136 Other intervertebral disc degeneration, lumbar region: Secondary | ICD-10-CM | POA: Diagnosis not present

## 2019-01-06 DIAGNOSIS — M5416 Radiculopathy, lumbar region: Secondary | ICD-10-CM | POA: Diagnosis not present

## 2019-01-06 DIAGNOSIS — M5440 Lumbago with sciatica, unspecified side: Secondary | ICD-10-CM | POA: Diagnosis not present

## 2019-01-06 MED FILL — VIIBRYD 20 MG TABLET: 20 | 90 days supply | Qty: 90 | Fill #0

## 2019-01-06 NOTE — Telephone Encounter (Signed)
It looks likt it was refilled earlier today. pls let pt know

## 2019-01-09 NOTE — Progress Notes (Signed)
Office: [phone]  /  Fax: [phone] TeleHealth Visit:  Kathy Clark has verbally consented to this TeleHealth visit today. The patient is located at home, the provider is located at the [employer] [facility] office. The participants in this visit include the listed provider and patient. The visit was conducted today via Webex.  HPI:   Chief Complaint: OBESITY Kathy Clark is here to discuss her progress with her obesity treatment plan. She is on the  keep a food journal with 1400 calories and 85+g of protein  daily and is following her eating plan approximately  75% of the time. She states she is exercising by walking for 30 minutes 4 times per week. Kathy Clark has weighed at home and is at 235lbs today. Work has been slightly less stressful and so has not felt pressure at work. Her personal life is still stressful and still trying to get on track with journaling and eating more lean protein.  We were unable to weigh the patient today for this TeleHealth visit. She feels as if she has maintained weight since her last visit. She has lost 8 lbs since starting treatment with [place].  Left Carpal Tunnel  Kathy Clark has left carpal tunnel. She still has some numbness in left 2 finger tips. She has had significant improvement in signs and symptoms status post surgery.   Hypertension Kathy Clark is a 58 y.o. female with hypertension.  Kathy Clark denies chest pain/pressure, headache, or shortness of breath on exertion. She is working weight loss to help control her blood pressure with the goal of decreasing her risk of heart attack and stroke. [name] blood pressure is currently controlled.    ASSESSMENT AND PLAN:  Left carpal tunnel syndrome, post right carpal tunnel release  HYPERTENSION, BENIGN ESSENTIAL  Class 2 severe obesity with serious comorbidity and body mass index (BMI) of 35.0 to 35.9 in adult, unspecified obesity type [facility])  PLAN:  Left Carpal Tunnel  Kathy Clark agrees to  follow up with orthopedics. She agrees to follow up with our clinic as directed.   Hypertension We discussed sodium restriction, working on healthy weight loss, and a regular exercise program as the means to achieve improved blood pressure control. Kathy Clark agreed with this plan and agreed to follow up as directed. We will continue to monitor her blood pressure as well as her progress with the above lifestyle modifications. She will continue her medications as prescribed and will watch for signs of hypotension as she continues her lifestyle modifications.  Obesity Kathy Clark is currently in the action stage of change. As such, her goal is to continue with weight loss efforts She has agreed to keep a food journal with 1400 calories and 85+g of protein daily.  Kathy Clark has been instructed to work up to a goal of 150 minutes of combined cardio and strengthening exercise per week for weight loss and overall health benefits. We discussed the following Behavioral Modification Stratagies today:keeping healthy foods in the home, better snacking choices, planning for success,  increasing lean protein intake, increasing vegetables and work on meal planning and easy cooking plans   Kathy Clark has agreed to follow up with our clinic in 2 weeks. She was informed of the importance of frequent follow up visits to maximize her success with intensive lifestyle modifications for her multiple health conditions.  ALLERGIES: Allergies  Allergen Reactions  . Succinylcholine Anaphylaxis  . Dilaudid [Hydromorphone Hcl] Nausea And Vomiting    MEDICATIONS: Current Outpatient Medications on File Prior to  Visit  Medication Sig Dispense Refill  . albuterol (PROVENTIL HFA;VENTOLIN HFA) 108 (90 Base) MCG/ACT inhaler Inhale 2 puffs into the lungs every 6 (six) hours as needed for wheezing or shortness of breath. 1 Inhaler 0  . AMBULATORY NON FORMULARY MEDICATION Medication Name: Please decrease CPAP setting to 8 cm of water pressure and  fax [place] a download after 5 days.  Is having a lot of difficulty with air leaks is working to try to reduce her pressure to see if she is still being adequately treated but with fewer leaks.FAx to [place] 1 Units 0  . buPROPion (WELLBUTRIN SR) 150 MG 12 hr tablet Take 1 tablet (150 mg total) by mouth daily. 30 tablet 0  . Calcium Carbonate-Vitamin D (CALTRATE 600+D PO) Take 1 tablet by mouth daily.    . clonazePAM (KLONOPIN) 0.25 MG disintegrating tablet Take 1 tablet (0.25 mg total) by mouth daily as needed. 20 tablet 0  . clotrimazole-betamethasone (LOTRISONE) cream Apply 1 application topically at bedtime as needed. 15 g 0  . EPINEPHrine 0.3 mg/0.3 mL IJ SOAJ injection Inject 0.3 mLs (0.3 mg total) into the muscle as needed for anaphylaxis. 1 Device 2  . estradiol (VIVELLE-DOT) 0.075 MG/24HR   3  . gabapentin (NEURONTIN) 300 MG capsule 2 caps p.o. twice daily for a week then increase dose as needed up to a maximum of 3600 mg daily 180 capsule 3  . hydrochlorothiazide (MICROZIDE) 12.5 MG capsule TAKE 1 CAPSULE BY MOUTH DAILY. 90 capsule 3  . hyoscyamine (LEVBID) 0.375 MG 12 hr tablet Take 1 tablet (0.375 mg total) by mouth 2 (two) times daily. 60 tablet 0  . losartan (COZAAR) 25 MG tablet TAKE 1 TABLET BY MOUTH DAILY. 90 tablet 3  . Melatonin 3 MG TABS Take by mouth as directed.    . Multiple Vitamins-Minerals (MULTIVITAMIN ADULT PO) Take by mouth daily.    . pimecrolimus (ELIDEL) 1 % cream Apply topically at bedtime. 30 g 0  . SUMAtriptan (IMITREX) 20 MG/ACT nasal spray PLACE 1 SPRAY INTO THE NOSE EVERY 2 HOURS AS NEEDED FOR. HEADACHE 6 Inhaler 2  . Vitamin D, Ergocalciferol, (DRISDOL) 1.25 MG (50000 UT) CAPS capsule Take 1 capsule (50,000 Units total) by mouth every 7 (seven) days. 4 capsule 0   No current facility-administered medications on file prior to visit.     PAST MEDICAL HISTORY: Past Medical History:  Diagnosis Date  . Anxiety   . Back pain   . Complication of  anesthesia    allergy to Succinylcholine  . Depression   . Essential hypertension, benign   . Gastritis   . GERD (gastroesophageal reflux disease)   . Hypertension   . Joint pain   . Lymphocytic colitis    microscopic  . Migraines   . Osteoarthritis   . Sciatica    right leg  . Swelling    feet, left foot    PAST SURGICAL HISTORY: Past Surgical History:  Procedure Laterality Date  . ABDOMINAL HYSTERECTOMY  07-2005  . [place] Bunionectomy Left 09/18/2016   Left Foot  . BACK SURGERY    . KNEE SURGERY  2006   left; multiple  . microdiscetomy  2005  . SHOULDER SURGERY  07-22-09   right  . SKIN CANCER EXCISION     eyelid  . TOTAL KNEE ARTHROPLASTY  07/27/2011   Procedure: TOTAL KNEE ARTHROPLASTY;  Surgeon: Gearlean Alf;  Location: [facility];  Service: Orthopedics;  Laterality: Left;    SOCIAL  HISTORY: Social History   Tobacco Use  . Smoking status: Never Smoker  . Smokeless tobacco: Never Used  Substance Use Topics  . Alcohol use: Yes    Comment: occasionally  . Drug use: No    FAMILY HISTORY: Family History  Problem Relation Age of Onset  . Breast cancer Unknown   . Tongue cancer Mother   . Breast cancer Mother   . Anxiety disorder Mother   . Depression Mother   . Heart disease Father   . Hypertension Father   . Hyperlipidemia Father   . Stroke Paternal Grandmother   . Colon cancer Neg Hx     ROS: Review of Systems  Respiratory: Negative for shortness of breath.   Cardiovascular: Negative for chest pain.    PHYSICAL EXAM: Pt in no acute distress  RECENT LABS AND TESTS: BMET    Component Value Date/Time   NA 139 12/12/2018 0823   NA 142 08/18/2017 0940   K 4.5 12/12/2018 0823   CL 102 12/12/2018 0823   CO2 27 12/12/2018 0823   GLUCOSE 86 12/12/2018 0823   BUN 16 12/12/2018 0823   BUN 12 08/18/2017 0940   CREATININE 0.78 12/12/2018 0823   CALCIUM 9.1 12/12/2018 0823   GFRNONAA 84 12/12/2018 0823   GFRAA 97 12/12/2018 0823   Lab Results   Component Value Date   HGBA1C 5.1 12/01/2016   HGBA1C 4.9 08/03/2016   Lab Results  Component Value Date   INSULIN 5.4 12/01/2016   INSULIN 5.9 08/03/2016   CBC    Component Value Date/Time   WBC WILL FOLLOW 08/18/2017 0940   WBC 6.7 03/26/2016 1600   RBC WILL FOLLOW 08/18/2017 0940   RBC 4.68 03/26/2016 1600   HGB WILL FOLLOW 08/18/2017 0940   HCT WILL FOLLOW 08/18/2017 0940   PLT WILL FOLLOW 08/18/2017 0940   MCV WILL FOLLOW 08/18/2017 0940   MCH WILL FOLLOW 08/18/2017 0940   MCH 30.6 03/26/2016 1600   MCHC WILL FOLLOW 08/18/2017 0940   MCHC 33.6 03/26/2016 1600   RDW WILL FOLLOW 08/18/2017 0940   LYMPHSABS WILL FOLLOW 08/18/2017 0940   MONOABS 469 03/26/2016 1600   EOSABS WILL FOLLOW 08/18/2017 0940   BASOSABS WILL FOLLOW 08/18/2017 0940   Iron/TIBC/Ferritin/ %Sat    Component Value Date/Time   FERRITIN 142 12/25/2015 0912   Lipid Panel     Component Value Date/Time   CHOL 196 05/02/2018 0943   TRIG 74 05/02/2018 0943   HDL 61 05/02/2018 0943   CHOLHDL 3.0 04/05/2015 0937   VLDL 21 04/05/2015 0937   LDLCALC 120 (H) 05/02/2018 0943   Hepatic Function Panel     Component Value Date/Time   PROT 6.7 12/12/2018 0823   PROT 6.9 08/18/2017 0940   ALBUMIN 4.4 08/18/2017 0940   AST 17 12/12/2018 0823   ALT 20 12/12/2018 0823   ALKPHOS 47 08/18/2017 0940   BILITOT 0.7 12/12/2018 0823   BILITOT 0.9 08/18/2017 0940   BILIDIR 0.1 12/25/2015 0912   IBILI 0.5 12/25/2015 0912      Component Value Date/Time   TSH 1.470 08/03/2016 1458   TSH 2.09 03/26/2016 1600   TSH 3.10 12/25/2015 0912      I, Renee Ramus, am acting as Location manager for Ilene Qua, MD   I have reviewed the above documentation for accuracy and completeness, and I agree with the above. - Ilene Qua, MD

## 2019-01-17 ENCOUNTER — Other Ambulatory Visit: Payer: Self-pay

## 2019-01-17 ENCOUNTER — Ambulatory Visit (INDEPENDENT_AMBULATORY_CARE_PROVIDER_SITE_OTHER): Payer: 59 | Admitting: Family Medicine

## 2019-01-17 ENCOUNTER — Encounter: Payer: Self-pay | Admitting: Family Medicine

## 2019-01-17 VITALS — BP 130/76 | HR 70 | Ht 67.0 in | Wt 241.0 lb

## 2019-01-17 DIAGNOSIS — R21 Rash and other nonspecific skin eruption: Secondary | ICD-10-CM

## 2019-01-17 MED ORDER — MUPIROCIN 2 % EX OINT: TOPICAL_OINTMENT | Freq: Two times a day (BID) | CUTANEOUS | 0 refills | Status: AC

## 2019-01-17 MED FILL — MUPIROCIN 2% OINTMENT: 2 | 10 days supply | Qty: 22 | Fill #0

## 2019-01-17 NOTE — Patient Instructions (Signed)
Health Maintenance, Female Adopting a healthy lifestyle and getting preventive care are important in promoting health and wellness. Ask your health care provider about:  The right schedule for you to have regular tests and exams.  Things you can do on your own to prevent diseases and keep yourself healthy. What should I know about diet, weight, and exercise? Eat a healthy diet   Eat a diet that includes plenty of vegetables, fruits, low-fat dairy products, and lean protein.  Do not eat a lot of foods that are high in solid fats, added sugars, or sodium. Maintain a healthy weight Body mass index (BMI) is used to identify weight problems. It estimates body fat based on height and weight. Your health care provider can help determine your BMI and help you achieve or maintain a healthy weight. Get regular exercise Get regular exercise. This is one of the most important things you can do for your health. Most adults should:  Exercise for at least 150 minutes each week. The exercise should increase your heart rate and make you sweat (moderate-intensity exercise).  Do strengthening exercises at least twice a week. This is in addition to the moderate-intensity exercise.  Spend less time sitting. Even light physical activity can be beneficial. Watch cholesterol and blood lipids Have your blood tested for lipids and cholesterol at 59 years of age, then have this test every 5 years. Have your cholesterol levels checked more often if:  Your lipid or cholesterol levels are high.  You are older than 59 years of age.  You are at high risk for heart disease. What should I know about cancer screening? Depending on your health history and family history, you may need to have cancer screening at various ages. This may include screening for:  Breast cancer.  Cervical cancer.  Colorectal cancer.  Skin cancer.  Lung cancer. What should I know about heart disease, diabetes, and high blood  pressure? Blood pressure and heart disease  High blood pressure causes heart disease and increases the risk of stroke. This is more likely to develop in people who have high blood pressure readings, are of African descent, or are overweight.  Have your blood pressure checked: ? Every 3-5 years if you are 18-39 years of age. ? Every year if you are 40 years old or older. Diabetes Have regular diabetes screenings. This checks your fasting blood sugar level. Have the screening done:  Once every three years after age 40 if you are at a normal weight and have a low risk for diabetes.  More often and at a younger age if you are overweight or have a high risk for diabetes. What should I know about preventing infection? Hepatitis B If you have a higher risk for hepatitis B, you should be screened for this virus. Talk with your health care provider to find out if you are at risk for hepatitis B infection. Hepatitis C Testing is recommended for:  Everyone born from 1945 through 1965.  Anyone with known risk factors for hepatitis C. Sexually transmitted infections (STIs)  Get screened for STIs, including gonorrhea and chlamydia, if: ? You are sexually active and are younger than 59 years of age. ? You are older than 59 years of age and your health care provider tells you that you are at risk for this type of infection. ? Your sexual activity has changed since you were last screened, and you are at increased risk for chlamydia or gonorrhea. Ask your health care provider if   you are at risk.  Ask your health care provider about whether you are at high risk for HIV. Your health care provider may recommend a prescription medicine to help prevent HIV infection. If you choose to take medicine to prevent HIV, you should first get tested for HIV. You should then be tested every 3 months for as long as you are taking the medicine. Pregnancy  If you are about to stop having your period (premenopausal) and  you may become pregnant, seek counseling before you get pregnant.  Take 400 to 800 micrograms (mcg) of folic acid every day if you become pregnant.  Ask for birth control (contraception) if you want to prevent pregnancy. Osteoporosis and menopause Osteoporosis is a disease in which the bones lose minerals and strength with aging. This can result in bone fractures. If you are 65 years old or older, or if you are at risk for osteoporosis and fractures, ask your health care provider if you should:  Be screened for bone loss.  Take a calcium or vitamin D supplement to lower your risk of fractures.  Be given hormone replacement therapy (HRT) to treat symptoms of menopause. Follow these instructions at home: Lifestyle  Do not use any products that contain nicotine or tobacco, such as cigarettes, e-cigarettes, and chewing tobacco. If you need help quitting, ask your health care provider.  Do not use street drugs.  Do not share needles.  Ask your health care provider for help if you need support or information about quitting drugs. Alcohol use  Do not drink alcohol if: ? Your health care provider tells you not to drink. ? You are pregnant, may be pregnant, or are planning to become pregnant.  If you drink alcohol: ? Limit how much you use to 0-1 drink a day. ? Limit intake if you are breastfeeding.  Be aware of how much alcohol is in your drink. In the [place] one drink equals one 12 oz bottle of beer (355 mL), one 5 oz glass of wine (148 mL), or one 1 oz glass of hard liquor (44 mL). General instructions  Schedule regular health, dental, and eye exams.  Stay current with your vaccines.  Tell your health care provider if: ? You often feel depressed. ? You have ever been abused or do not feel safe at home. Summary  Adopting a healthy lifestyle and getting preventive care are important in promoting health and wellness.  Follow your health care provider's instructions about healthy  diet, exercising, and getting tested or screened for diseases.  Follow your health care provider's instructions on monitoring your cholesterol and blood pressure. This information is not intended to replace advice given to you by your health care provider. Make sure you discuss any questions you have with your health care provider. Document Released: 01/12/2011 Document Revised: 06/22/2018 Document Reviewed: 06/22/2018 Elsevier Patient Education  2020 Elsevier Inc.  

## 2019-01-17 NOTE — Progress Notes (Signed)
Subjective:     Kathy Clark is a 58 y.o. female and is here for a comprehensive physical exam. The patient reports no problems.  Her divorce has finally gone through.  That has been somewhat of a relief but now they are in the process of separating property.  She does still have a restraining order against her ex-husband.  He has gained a little bit of weight since working from home.  Social History   Socioeconomic History  . Marital status: Legally Separated    Spouse name: Jenny Reichmann  . Number of children: 2  . Years of education: Not on file  . Highest education level: Not on file  Occupational History  . Occupation: Programmer, multimedia: [employer]  . Financial resource strain: Not on file  . Food insecurity    Worry: Not on file    Inability: Not on file  . Transportation needs    Medical: Not on file    Non-medical: Not on file  Tobacco Use  . Smoking status: Never Smoker  . Smokeless tobacco: Never Used  Substance and Sexual Activity  . Alcohol use: Yes    Comment: occasionally  . Drug use: No  . 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  Lifestyle  . Physical activity    Days per week: Not on file    Minutes per session: Not on file  . Stress: Not on file  Relationships  . Social Herbalist on phone: Not on file    Gets together: Not on file    Attends religious service: Not on file    Active member of club or organization: Not on file    Attends meetings of clubs or organizations: Not on file    Relationship status: Not on file  . Intimate partner violence    Fear of current or ex partner: Not on file    Emotionally abused: Not on file    Physically abused: Not on file    Forced sexual activity: Not on file  Other Topics Concern  . Not on file  Social History Narrative  . Not on file   Health Maintenance  Topic Date Due  . INFLUENZA VACCINE  02/11/2019  . MAMMOGRAM   09/22/2019  . TETANUS/TDAP  03/11/2025  . COLONOSCOPY  11/05/2025  . Hepatitis C Screening  Completed  . HIV Screening  Completed    The following portions of the patient's history were reviewed and updated as appropriate: allergies, current medications, past family history, past medical history, past social history, past surgical history and problem list.  Review of Systems A comprehensive review of systems was negative.   Objective:    BP 130/76   Pulse 70   Ht 5\' 7"  (1.702 m)   Wt 241 lb (109.3 kg)   LMP  (LMP Unknown) Comment: hystrectomy  SpO2 99%   BMI 37.75 kg/m  General appearance: alert, cooperative and appears stated age Head: Normocephalic, without obvious abnormality, atraumatic Eyes: conj claer, EOMI, PEERLA Ears: normal TM's and external ear canals both ears Nose: Nares normal. Septum midline. Mucosa normal. No drainage or sinus tenderness. Throat: lips, mucosa, and tongue normal; teeth and gums normal Neck: no adenopathy, no carotid bruit, no JVD, supple, symmetrical, trachea midline and thyroid not enlarged, symmetric, no tenderness/mass/nodules Back: symmetric, no curvature. ROM normal. No CVA tenderness. Lungs: clear to auscultation bilaterally Heart: regular  rate and rhythm, S1, S2 normal, no murmur, click, rub or gallop Abdomen: soft, non-tender; bowel sounds normal; no masses,  no organomegaly Extremities: extremities normal, atraumatic, no cyanosis or edema Pulses: 2+ and symmetric Skin: Skin color, texture, turgor normal. No rashes or lesions Lymph nodes: Cervical adenopathy: nl and Supraclavicular adenopathy: nl Neurologic: Alert and oriented X 3, normal strength and tone. Normal symmetric reflexes. Normal coordination and gait    Assessment:    Healthy female exam.      Plan:     See After Visit Summary for Counseling Recommendations   Keep up a regular exercise program and make sure you are eating a healthy diet Try to eat 4 servings of dairy  a day, or if you are lactose intolerant take a calcium with vitamin D daily.  Your vaccines are up to date.

## 2019-01-19 ENCOUNTER — Other Ambulatory Visit: Payer: Self-pay

## 2019-01-19 ENCOUNTER — Encounter (INDEPENDENT_AMBULATORY_CARE_PROVIDER_SITE_OTHER): Payer: Self-pay | Admitting: Family Medicine

## 2019-01-19 ENCOUNTER — Ambulatory Visit (INDEPENDENT_AMBULATORY_CARE_PROVIDER_SITE_OTHER): Payer: 59 | Admitting: Family Medicine

## 2019-01-19 DIAGNOSIS — Z6835 Body mass index (BMI) 35.0-35.9, adult: Secondary | ICD-10-CM

## 2019-01-19 DIAGNOSIS — E8881 Metabolic syndrome: Secondary | ICD-10-CM | POA: Diagnosis not present

## 2019-01-19 DIAGNOSIS — E7849 Other hyperlipidemia: Secondary | ICD-10-CM

## 2019-01-19 DIAGNOSIS — E559 Vitamin D deficiency, unspecified: Secondary | ICD-10-CM

## 2019-01-23 NOTE — Progress Notes (Signed)
Office: [phone]  /  Fax: [phone] TeleHealth Visit:  Kathy Clark has verbally consented to this TeleHealth visit today. The patient is located at home, the provider is located at the [facility] office. The participants in this visit include the listed provider and patient. The visit was conducted today via webex.  HPI:   Chief Complaint: OBESITY Kathy Clark is here to discuss her progress with her obesity treatment plan. She is on the keep a food journal with 1400 calories and 85+ grams of protein daily and is following her eating plan approximately 75 % of the time. She states she is walking for 30 minutes 4 times per week. Kathy [place]. She has had a less stressful few weeks and some financial lessening of stress. She has no vacation planned for the next month and a half.  We were unable to weigh the patient today for this TeleHealth visit. She feels as if she has gained 2 lbs since her last visit. She has lost 8 lbs since starting treatment with [place].  Vitamin D Deficiency Kathy Clark has a diagnosis of vitamin D deficiency. She is currently taking prescription Vit D. She notes fatigue and denies nausea, vomiting or muscle weakness.  Hyperlipidemia Kathy Clark has hyperlipidemia and has been trying to improve her cholesterol levels with intensive lifestyle modification including a low saturated fat diet, exercise and weight loss. She couldn't get her labs done previously. She is interested in getting labs done at [facility]. She denies any chest pain, claudication or myalgias.  Insulin Resistance Kathy Clark has a diagnosis of insulin resistance based on her elevated fasting insulin level >5. Last Hgb A1c was of 5.1 and insulin of 5.9. Although Kathy Clark's blood glucose readings are still under good control, insulin resistance puts her at greater risk of metabolic syndrome and diabetes. She is not taking metformin currently and continues to work  on diet and exercise to decrease risk of diabetes.  ASSESSMENT AND PLAN:  Vitamin D deficiency - Plan: VITAMIN D 25 Hydroxy (Vit-D Deficiency, Fractures)  Other hyperlipidemia - Plan: Lipid Panel w/reflex Direct LDL  Insulin resistance - Plan: Hemoglobin A1c  Class 2 severe obesity with serious comorbidity and body mass index (BMI) of 35.0 to 35.9 in adult, unspecified obesity type ([place])  PLAN:  Vitamin D Deficiency Kathy Clark was informed that low vitamin D levels contributes to fatigue and are associated with obesity, breast, and colon cancer. Kathy Clark agrees to continue taking prescription Vit D 50,000 IU every week and will follow up for routine testing of vitamin D, at least 2-3 times per year. She was informed of the risk of over-replacement of vitamin D and agrees to not increase her dose unless she discusses this with [place] first. We will check Vit D level today. Kathy Clark agrees to follow up with our clinic in 2 to 3 weeks.  Hyperlipidemia Kathy Clark was informed of the American Heart Association Guidelines emphasizing intensive lifestyle modifications as the first line treatment for hyperlipidemia. We discussed many lifestyle modifications today in depth, and Kathy Clark will continue to work on decreasing saturated fats such as fatty red meat, butter and many fried foods. She will also increase vegetables and lean protein in her diet and continue to work on exercise and weight loss efforts. We will check FLP today. Kathy Clark agrees to follow up with our clinic in 2 to 3 weeks.  Insulin Resistance Kathy Clark will continue to work on weight loss,  exercise, and decreasing simple carbohydrates in her diet to help decrease the risk of diabetes. We dicussed metformin including benefits and risks. She was informed that eating too many simple carbohydrates or too many calories at one sitting increases the likelihood of GI side effects. Devi declined metformin for now and prescription was not written today. We will check Hgb  A1c today. Kathy Clark agrees to follow up with our clinic in 2 to 3 weeks as directed to monitor her progress.  Obesity Kathy Clark is currently in the action stage of change. As such, her goal is to continue with weight loss efforts She has agreed to keep a food journal with 1400 calories and 85+ grams of protein daily Kathy Clark has been instructed to work up to a goal of 150 minutes of combined cardio and strengthening exercise per week for weight loss and overall health benefits. We discussed the following Behavioral Modification Strategies today: increasing lean protein intake, increasing vegetables and work on meal planning and easy cooking plans, better snacking choices, and planning for success   Kathy Clark has agreed to follow up with our clinic in 2 to 3 weeks. She was informed of the importance of frequent follow up visits to maximize her success with intensive lifestyle modifications for her multiple health conditions.  ALLERGIES: Allergies  Allergen Reactions   Succinylcholine Anaphylaxis   Dilaudid [Hydromorphone Hcl] Nausea And Vomiting    MEDICATIONS: Current Outpatient Medications on File Prior to Visit  Medication Sig Dispense Refill   albuterol (PROVENTIL HFA;VENTOLIN HFA) 108 (90 Base) MCG/ACT inhaler Inhale 2 puffs into the lungs every 6 (six) hours as needed for wheezing or shortness of breath. 1 Inhaler 0   AMBULATORY NON FORMULARY MEDICATION Medication Name: Please decrease CPAP setting to 8 cm of water pressure and fax [place] a download after 5 days.  Is having a lot of difficulty with air leaks is working to try to reduce her pressure to see if she is still being adequately treated but with fewer leaks.FAx to [employer] 1 Units 0   buPROPion (WELLBUTRIN SR) 150 MG 12 hr tablet Take 1 tablet (150 mg total) by mouth daily. 30 tablet 0   Calcium Carbonate-Vitamin D (CALTRATE 600+D PO) Take 1 tablet by mouth daily.     clonazePAM (KLONOPIN) 0.25 MG disintegrating tablet Take 1  tablet (0.25 mg total) by mouth daily as needed. 20 tablet 0   EPINEPHrine 0.3 mg/0.3 mL IJ SOAJ injection Inject 0.3 mLs (0.3 mg total) into the muscle as needed for anaphylaxis. 1 Device 2   estradiol (VIVELLE-DOT) 0.075 MG/24HR   3   gabapentin (NEURONTIN) 300 MG capsule 2 caps p.o. twice daily for a week then increase dose as needed up to a maximum of 3600 mg daily (Patient taking differently: Take 300 mg by mouth 2 (two) times daily. 1 capsule twice daily) 180 capsule 3   hydrochlorothiazide (MICROZIDE) 12.5 MG capsule TAKE 1 CAPSULE BY MOUTH DAILY. 90 capsule 3   hyoscyamine (LEVBID) 0.375 MG 12 hr tablet Take 1 tablet (0.375 mg total) by mouth 2 (two) times daily. 60 tablet 0   losartan (COZAAR) 25 MG tablet TAKE 1 TABLET BY MOUTH DAILY. 90 tablet 3   Melatonin 3 MG TABS Take by mouth as directed.     Multiple Vitamins-Minerals (MULTIVITAMIN ADULT PO) Take by mouth daily.     mupirocin ointment (BACTROBAN) 2 % Apply topically 2 (two) times daily for 10 days. 30 g 0   SUMAtriptan (IMITREX) 20 MG/ACT nasal  spray PLACE 1 SPRAY INTO THE NOSE EVERY 2 HOURS AS NEEDED FOR. HEADACHE 6 Inhaler 2   VIIBRYD 20 MG TABS TAKE 1 TABLET BY MOUTH DAILY. 90 tablet 2   Vitamin D, Ergocalciferol, (DRISDOL) 1.25 MG (50000 UT) CAPS capsule Take 1 capsule (50,000 Units total) by mouth every 7 (seven) days. 4 capsule 0   No current facility-administered medications on file prior to visit.     PAST MEDICAL HISTORY: Past Medical History:  Diagnosis Date   Anxiety    Back pain    Complication of anesthesia    allergy to Succinylcholine   Depression    Essential hypertension, benign    Gastritis    GERD (gastroesophageal reflux disease)    Hypertension    Joint pain    Lymphocytic colitis    microscopic   Migraines    Osteoarthritis    Sciatica    right leg   Swelling    feet, left foot    PAST SURGICAL HISTORY: Past Surgical History:  Procedure Laterality Date    ABDOMINAL HYSTERECTOMY  07-2005   [place] Bunionectomy Left 09/18/2016   Left Foot   BACK SURGERY     KNEE SURGERY  2006   left; multiple   microdiscetomy  2005   SHOULDER SURGERY  07-22-09   right   SKIN CANCER EXCISION     eyelid   TOTAL KNEE ARTHROPLASTY  07/27/2011   Procedure: TOTAL KNEE ARTHROPLASTY;  Surgeon: Gearlean Alf;  Location: [facility];  Service: Orthopedics;  Laterality: Left;    SOCIAL HISTORY: Social History   Tobacco Use   Smoking status: Never Smoker   Smokeless tobacco: Never Used  Substance Use Topics   Alcohol use: Yes    Comment: occasionally   Drug use: No    FAMILY HISTORY: Family History  Problem Relation Age of Onset   Breast cancer Unknown    Tongue cancer Mother    Breast cancer Mother    Anxiety disorder Mother    Depression Mother    Heart disease Father    Hypertension Father    Hyperlipidemia Father    Stroke Paternal Grandmother    Colon cancer Neg Hx     ROS: Review of Systems  Constitutional: Positive for malaise/fatigue. Negative for weight loss.  Cardiovascular: Negative for chest pain and claudication.  Gastrointestinal: Negative for nausea and vomiting.  Musculoskeletal: Negative for myalgias.       Negative muscle weakness    PHYSICAL EXAM: Pt in no acute distress  RECENT LABS AND TESTS: BMET    Component Value Date/Time   NA 139 12/12/2018 0823   NA 142 08/18/2017 0940   K 4.5 12/12/2018 0823   CL 102 12/12/2018 0823   CO2 27 12/12/2018 0823   GLUCOSE 86 12/12/2018 0823   BUN 16 12/12/2018 0823   BUN 12 08/18/2017 0940   CREATININE 0.78 12/12/2018 0823   CALCIUM 9.1 12/12/2018 0823   GFRNONAA 84 12/12/2018 0823   GFRAA 97 12/12/2018 0823   Lab Results  Component Value Date   HGBA1C 5.1 12/01/2016   HGBA1C 4.9 08/03/2016   Lab Results  Component Value Date   INSULIN 5.4 12/01/2016   INSULIN 5.9 08/03/2016   CBC    Component Value Date/Time   WBC WILL FOLLOW 08/18/2017 0940     WBC 6.7 03/26/2016 1600   RBC WILL FOLLOW 08/18/2017 0940   RBC 4.68 03/26/2016 1600   HGB WILL FOLLOW 08/18/2017 0940   HCT WILL  FOLLOW 08/18/2017 0940   PLT WILL FOLLOW 08/18/2017 0940   MCV WILL FOLLOW 08/18/2017 0940   MCH WILL FOLLOW 08/18/2017 0940   MCH 30.6 03/26/2016 1600   MCHC WILL FOLLOW 08/18/2017 0940   MCHC 33.6 03/26/2016 1600   RDW WILL FOLLOW 08/18/2017 0940   LYMPHSABS WILL FOLLOW 08/18/2017 0940   MONOABS 469 03/26/2016 1600   EOSABS WILL FOLLOW 08/18/2017 0940   BASOSABS WILL FOLLOW 08/18/2017 0940   Iron/TIBC/Ferritin/ %Sat    Component Value Date/Time   FERRITIN 142 12/25/2015 0912   Lipid Panel     Component Value Date/Time   CHOL 196 05/02/2018 0943   TRIG 74 05/02/2018 0943   HDL 61 05/02/2018 0943   CHOLHDL 3.0 04/05/2015 0937   VLDL 21 04/05/2015 0937   LDLCALC 120 (H) 05/02/2018 0943   Hepatic Function Panel     Component Value Date/Time   PROT 6.7 12/12/2018 0823   PROT 6.9 08/18/2017 0940   ALBUMIN 4.4 08/18/2017 0940   AST 17 12/12/2018 0823   ALT 20 12/12/2018 0823   ALKPHOS 47 08/18/2017 0940   BILITOT 0.7 12/12/2018 0823   BILITOT 0.9 08/18/2017 0940   BILIDIR 0.1 12/25/2015 0912   IBILI 0.5 12/25/2015 0912      Component Value Date/Time   TSH 1.470 08/03/2016 1458   TSH 2.09 03/26/2016 1600   TSH 3.10 12/25/2015 0912      I, Trixie Dredge, am acting as transcriptionist for Ilene Qua, MD  I have reviewed the above documentation for accuracy and completeness, and I agree with the above. - Ilene Qua, MD

## 2019-01-31 DIAGNOSIS — G4733 Obstructive sleep apnea (adult) (pediatric): Secondary | ICD-10-CM | POA: Diagnosis not present

## 2019-02-02 ENCOUNTER — Encounter (INDEPENDENT_AMBULATORY_CARE_PROVIDER_SITE_OTHER): Payer: Self-pay | Admitting: Family Medicine

## 2019-02-02 ENCOUNTER — Other Ambulatory Visit: Payer: Self-pay

## 2019-02-02 ENCOUNTER — Telehealth (INDEPENDENT_AMBULATORY_CARE_PROVIDER_SITE_OTHER): Payer: 59 | Admitting: Family Medicine

## 2019-02-02 DIAGNOSIS — E669 Obesity, unspecified: Secondary | ICD-10-CM

## 2019-02-02 DIAGNOSIS — F3289 Other specified depressive episodes: Secondary | ICD-10-CM | POA: Diagnosis not present

## 2019-02-02 DIAGNOSIS — E559 Vitamin D deficiency, unspecified: Secondary | ICD-10-CM | POA: Diagnosis not present

## 2019-02-02 DIAGNOSIS — Z6833 Body mass index (BMI) 33.0-33.9, adult: Secondary | ICD-10-CM

## 2019-02-02 MED ORDER — BUPROPION HCL ER (SR) 150 MG PO TB12: 150.0000 mg | ORAL_TABLET | Freq: Every day | ORAL | 0 refills | Status: DC

## 2019-02-02 MED FILL — BUPROPION HCL ER (SR) 150 M: 150 | 30 days supply | Qty: 30 | Fill #0

## 2019-02-02 NOTE — Progress Notes (Signed)
Office: [phone]  /  Fax: [phone] TeleHealth Visit:  Kathy Clark has verbally consented to this TeleHealth visit today. The patient is located at home, the provider is located at the [employer] [facility] office. The participants in this visit include the listed provider and patient. The visit was conducted today via webex.  HPI:   Chief Complaint: OBESITY Allysen is here to discuss her progress with her obesity treatment plan. She is on the keep a food journal with 1400 calories and 85+ grams of protein daily and is following her eating plan approximately 90 % of the time. She states she is walking for 30 minutes 3 times per week. Lakoda voices the last few weeks were better in terms of journaling, and she finds herself snacking less. She states her personal life is slightly less stressful. She finds that she is enjoying working from home, and she knows she will be working from home until January 4th at least.  We were unable to weigh the patient today for this TeleHealth visit. She feels as if she has lost weight since her last visit. She has lost 8 lbs since starting treatment with [place].  Vitamin D Deficiency Nyhla has a diagnosis of vitamin D deficiency. She is currently taking prescription Vit D. She notes fatigue and denies nausea, vomiting or muscle weakness.  Depression with Emotional Eating Behaviors Tsuruko notes her symptoms are better controlled. Yasamin struggles with emotional eating and using food for comfort to the extent that it is negatively impacting her health. She often snacks when she is not hungry. Tyreesha sometimes feels she is out of control and then feels guilty that she made poor food choices. She has been working on behavior modification techniques to help reduce her emotional eating and has been somewhat successful. She shows no sign of suicidal or homicidal ideations.  Depression screen [facility] 2/9 01/17/2019 11/28/2018 05/30/2018 12/10/2017 11/02/2017  Decreased  Interest 1 1 2 1 1   Down, Depressed, Hopeless 1 0 1 1 1   PHQ - 2 Score 2 1 3 2 2   Altered sleeping 1 1 3 2 3   Tired, decreased energy 1 1 3 2 1   Change in appetite 1 1 2 1 1   Feeling bad or failure about yourself  0 0 0 1 1  Trouble concentrating 1 0 - 1 -  Moving slowly or fidgety/restless 0 0 0 0 0  Suicidal thoughts 0 0 0 0 0  PHQ-9 Score 6 4 11 9 8   Difficult doing work/chores Somewhat difficult Somewhat difficult Somewhat difficult Somewhat difficult Somewhat difficult  Some recent data might be hidden    ASSESSMENT AND PLAN:  Other depression - with emotional eating - Plan: buPROPion (WELLBUTRIN SR) 150 MG 12 hr tablet  Vitamin D deficiency  Class 1 obesity with serious comorbidity and body mass index (BMI) of 33.0 to 33.9 in adult, unspecified obesity type  PLAN:  Vitamin D Deficiency Jaydalee was informed that low vitamin D levels contributes to fatigue and are associated with obesity, breast, and colon cancer. Nakeesha agrees to stop prescription Vit D, and she agrees to start OTC Vit D. She will follow up for routine testing of vitamin D, at least 2-3 times per year. She was informed of the risk of over-replacement of vitamin D and agrees to not increase her dose unless she discusses this with [place] first. Carlos agrees to follow up with our clinic in 2 weeks.  Depression with Emotional Eating Behaviors We discussed behavior  modification techniques today to help Perrin deal with her emotional eating and depression. Aryani agrees to continue taking bupropion 150 mg PO q daily #30 and we will refill for 1 month. Kambra agrees to follow up with ur clinic in 2 weeks.  Obesity Aanya is currently in the action stage of change. As such, her goal is to continue with weight loss efforts She has agreed to keep a food journal with 1300-1400 calories and 90+ grams of protein daily Paullette has been instructed to work up to a goal of 150 minutes of combined cardio and strengthening exercise per week  for weight loss and overall health benefits. We discussed the following Behavioral Modification Strategies today: increasing lean protein intake, increasing vegetables and work on meal planning and easy cooking plans, keeping healthy foods in the home, and planning for success   Quincee has agreed to follow up with our clinic in 2 weeks. She was informed of the importance of frequent follow up visits to maximize her success with intensive lifestyle modifications for her multiple health conditions.  ALLERGIES: Allergies  Allergen Reactions  . Succinylcholine Anaphylaxis  . Dilaudid [Hydromorphone Hcl] Nausea And Vomiting    MEDICATIONS: Current Outpatient Medications on File Prior to Visit  Medication Sig Dispense Refill  . albuterol (PROVENTIL HFA;VENTOLIN HFA) 108 (90 Base) MCG/ACT inhaler Inhale 2 puffs into the lungs every 6 (six) hours as needed for wheezing or shortness of breath. 1 Inhaler 0  . AMBULATORY NON FORMULARY MEDICATION Medication Name: Please decrease CPAP setting to 8 cm of water pressure and fax [place] a download after 5 days.  Is having a lot of difficulty with air leaks is working to try to reduce her pressure to see if she is still being adequately treated but with fewer leaks.FAx to [place] 1 Units 0  . Calcium Carbonate-Vitamin D (CALTRATE 600+D PO) Take 1 tablet by mouth daily.    . clonazePAM (KLONOPIN) 0.25 MG disintegrating tablet Take 1 tablet (0.25 mg total) by mouth daily as needed. 20 tablet 0  . EPINEPHrine 0.3 mg/0.3 mL IJ SOAJ injection Inject 0.3 mLs (0.3 mg total) into the muscle as needed for anaphylaxis. 1 Device 2  . estradiol (VIVELLE-DOT) 0.075 MG/24HR   3  . gabapentin (NEURONTIN) 300 MG capsule 2 caps p.o. twice daily for a week then increase dose as needed up to a maximum of 3600 mg daily (Patient taking differently: Take 300 mg by mouth 2 (two) times daily. 1 capsule twice daily) 180 capsule 3  . hydrochlorothiazide (MICROZIDE) 12.5 MG capsule  TAKE 1 CAPSULE BY MOUTH DAILY. 90 capsule 3  . hyoscyamine (LEVBID) 0.375 MG 12 hr tablet Take 1 tablet (0.375 mg total) by mouth 2 (two) times daily. 60 tablet 0  . losartan (COZAAR) 25 MG tablet TAKE 1 TABLET BY MOUTH DAILY. 90 tablet 3  . Melatonin 3 MG TABS Take by mouth as directed.    . Multiple Vitamins-Minerals (MULTIVITAMIN ADULT PO) Take by mouth daily.    . SUMAtriptan (IMITREX) 20 MG/ACT nasal spray PLACE 1 SPRAY INTO THE NOSE EVERY 2 HOURS AS NEEDED FOR. HEADACHE 6 Inhaler 2  . VIIBRYD 20 MG TABS TAKE 1 TABLET BY MOUTH DAILY. 90 tablet 2  . Vitamin D, Ergocalciferol, (DRISDOL) 1.25 MG (50000 UT) CAPS capsule Take 1 capsule (50,000 Units total) by mouth every 7 (seven) days. 4 capsule 0   No current facility-administered medications on file prior to visit.     PAST MEDICAL HISTORY: Past  Medical History:  Diagnosis Date  . Anxiety   . Back pain   . 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 Depression   . Essential hypertension, benign   . Gastritis   . GERD (gastroesophageal reflux disease)   . Hypertension   . Joint pain   . Lymphocytic colitis    microscopic  . Migraines   . Osteoarthritis   . Sciatica    right leg  . Swelling    feet, left foot    PAST SURGICAL HISTORY: Past Surgical History:  Procedure Laterality Date  . ABDOMINAL HYSTERECTOMY  07-2005  . [place] Bunionectomy Left 09/18/2016   Left Foot  . BACK SURGERY    . KNEE SURGERY  2006   left; multiple  . microdiscetomy  2005  . SHOULDER SURGERY  07-22-09   right  . SKIN CANCER EXCISION     eyelid  . TOTAL KNEE ARTHROPLASTY  07/27/2011   Procedure: TOTAL KNEE ARTHROPLASTY;  Surgeon: Gearlean Alf;  Location: [facility];  Service: Orthopedics;  Laterality: Left;    SOCIAL HISTORY: Social History   Tobacco Use  . Smoking status: Never Smoker  . Smokeless tobacco: Never Used  Substance Use Topics  . Alcohol use: Yes    Comment: occasionally  . Drug use: No    FAMILY  HISTORY: Family History  Problem Relation Age of Onset  . Breast cancer Unknown   . Tongue cancer Mother   . Breast cancer Mother   . Anxiety disorder Mother   . Depression Mother   . Heart disease Father   . Hypertension Father   . Hyperlipidemia Father   . Stroke Paternal Grandmother   . Colon cancer Neg Hx     ROS: Review of Systems  Constitutional: Positive for malaise/fatigue and weight loss.  Gastrointestinal: Negative for nausea and vomiting.  Musculoskeletal:       Negative muscle weakness  Psychiatric/Behavioral: Positive for depression. Negative for suicidal ideas.    PHYSICAL EXAM: Pt in no acute distress  RECENT LABS AND TESTS: BMET    Component Value Date/Time   NA 139 12/12/2018 0823   NA 142 08/18/2017 0940   K 4.5 12/12/2018 0823   CL 102 12/12/2018 0823   CO2 27 12/12/2018 0823   GLUCOSE 86 12/12/2018 0823   BUN 16 12/12/2018 0823   BUN 12 08/18/2017 0940   CREATININE 0.78 12/12/2018 0823   CALCIUM 9.1 12/12/2018 0823   GFRNONAA 84 12/12/2018 0823   GFRAA 97 12/12/2018 0823   Lab Results  Component Value Date   HGBA1C 5.1 12/01/2016   HGBA1C 4.9 08/03/2016   Lab Results  Component Value Date   INSULIN 5.4 12/01/2016   INSULIN 5.9 08/03/2016   CBC    Component Value Date/Time   WBC WILL FOLLOW 08/18/2017 0940   WBC 6.7 03/26/2016 1600   RBC WILL FOLLOW 08/18/2017 0940   RBC 4.68 03/26/2016 1600   HGB WILL FOLLOW 08/18/2017 0940   HCT WILL FOLLOW 08/18/2017 0940   PLT WILL FOLLOW 08/18/2017 0940   MCV WILL FOLLOW 08/18/2017 0940   MCH WILL FOLLOW 08/18/2017 0940   MCH 30.6 03/26/2016 1600   MCHC WILL FOLLOW 08/18/2017 0940   MCHC 33.6 03/26/2016 1600   RDW WILL FOLLOW 08/18/2017 0940   LYMPHSABS WILL FOLLOW 08/18/2017 0940   MONOABS 469 03/26/2016 1600   EOSABS WILL FOLLOW 08/18/2017 0940   BASOSABS WILL FOLLOW 08/18/2017 0940   Iron/TIBC/Ferritin/ %Sat  Component Value Date/Time   FERRITIN 142 12/25/2015 0912   Lipid  Panel     Component Value Date/Time   CHOL 196 05/02/2018 0943   TRIG 74 05/02/2018 0943   HDL 61 05/02/2018 0943   CHOLHDL 3.0 04/05/2015 0937   VLDL 21 04/05/2015 0937   LDLCALC 120 (H) 05/02/2018 0943   Hepatic Function Panel     Component Value Date/Time   PROT 6.7 12/12/2018 0823   PROT 6.9 08/18/2017 0940   ALBUMIN 4.4 08/18/2017 0940   AST 17 12/12/2018 0823   ALT 20 12/12/2018 0823   ALKPHOS 47 08/18/2017 0940   BILITOT 0.7 12/12/2018 0823   BILITOT 0.9 08/18/2017 0940   BILIDIR 0.1 12/25/2015 0912   IBILI 0.5 12/25/2015 0912      Component Value Date/Time   TSH 1.470 08/03/2016 1458   TSH 2.09 03/26/2016 1600   TSH 3.10 12/25/2015 0912      I, Trixie Dredge, am acting as transcriptionist for Ilene Qua, MD  I have reviewed the above documentation for accuracy and completeness, and I agree with the above. - Ilene Qua, MD

## 2019-02-06 MED FILL — DOTTI 0.075 MG/24HR PTTW: 0.075 | 84 days supply | Qty: 24 | Fill #0

## 2019-02-13 ENCOUNTER — Encounter (INDEPENDENT_AMBULATORY_CARE_PROVIDER_SITE_OTHER): Payer: Self-pay | Admitting: Family Medicine

## 2019-02-13 DIAGNOSIS — E559 Vitamin D deficiency, unspecified: Secondary | ICD-10-CM | POA: Diagnosis not present

## 2019-02-14 ENCOUNTER — Other Ambulatory Visit (INDEPENDENT_AMBULATORY_CARE_PROVIDER_SITE_OTHER): Payer: Self-pay | Admitting: Family Medicine

## 2019-02-14 DIAGNOSIS — E7849 Other hyperlipidemia: Secondary | ICD-10-CM | POA: Diagnosis not present

## 2019-02-14 DIAGNOSIS — E8881 Metabolic syndrome: Secondary | ICD-10-CM | POA: Diagnosis not present

## 2019-02-14 LAB — VITAMIN D 25 HYDROXY (VIT D DEFICIENCY, FRACTURES): Vit D, 25-Hydroxy: 29 ng/mL — ABNORMAL LOW (ref 30–100)

## 2019-02-15 ENCOUNTER — Encounter: Payer: Self-pay | Admitting: Family Medicine

## 2019-02-15 LAB — HEMOGLOBIN A1C W/OUT EAG: Hgb A1c MFr Bld: 5.1 % of total Hgb (ref <5.7)

## 2019-02-15 LAB — LIPID PANEL
Cholesterol: 206 mg/dL — ABNORMAL HIGH (ref <200)
Cholesterol: 206 — AB (ref 0–200)
HDL: 57 (ref 35–70)
HDL: 57 mg/dL (ref >=50)
LDL Cholesterol (Calc): 124 mg/dL (calc) — ABNORMAL HIGH
LDL Cholesterol: 124
Non-HDL Cholesterol (Calc): 149 mg/dL (calc) — ABNORMAL HIGH (ref <130)
Total CHOL/HDL Ratio: 3.6 (calc) (ref <5.0)
Triglycerides: 133 (ref 40–160)
Triglycerides: 133 mg/dL (ref <150)

## 2019-02-16 ENCOUNTER — Encounter (INDEPENDENT_AMBULATORY_CARE_PROVIDER_SITE_OTHER): Payer: Self-pay | Admitting: Family Medicine

## 2019-02-16 ENCOUNTER — Telehealth (INDEPENDENT_AMBULATORY_CARE_PROVIDER_SITE_OTHER): Payer: 59 | Admitting: Family Medicine

## 2019-02-16 ENCOUNTER — Other Ambulatory Visit: Payer: Self-pay

## 2019-02-16 DIAGNOSIS — E559 Vitamin D deficiency, unspecified: Secondary | ICD-10-CM

## 2019-02-16 DIAGNOSIS — E7849 Other hyperlipidemia: Secondary | ICD-10-CM

## 2019-02-16 DIAGNOSIS — Z6835 Body mass index (BMI) 35.0-35.9, adult: Secondary | ICD-10-CM | POA: Diagnosis not present

## 2019-02-16 MED ORDER — VITAMIN D (ERGOCALCIFEROL) 1.25 MG (50000 UNIT) PO CAPS: 50000.0000 [IU] | ORAL_CAPSULE | ORAL | 0 refills | Status: DC

## 2019-02-16 MED FILL — VIT D2 1.25 MG (50,000 UNIT: 1.25 MG | 28 days supply | Qty: 4 | Fill #0

## 2019-02-17 MED FILL — LOSARTAN POTASSIUM 25 MG TA: 25 | 90 days supply | Qty: 90 | Fill #0

## 2019-02-17 MED FILL — HYDROCHLOROTHIAZIDE 12.5 MG: 12.5 | 90 days supply | Qty: 90 | Fill #0

## 2019-02-20 NOTE — Progress Notes (Signed)
Office: [phone]  /  Fax: [phone] TeleHealth Visit:  Kathy Clark has verbally consented to this TeleHealth visit today. The patient is located at home, the provider is located at the [employer] [facility] office. The participants in this visit include the listed provider and patient. The visit was conducted today via Webex.  HPI:   Chief Complaint: OBESITY Kathy Clark is here to discuss her progress with her obesity treatment plan. She is keeping a food journal with 1400 calories and 80 grams of protein and is following her eating plan approximately 75% of the time. She states she is walking 30 minutes 4 times per week. Kathy Clark reports a weight loss since her last visit to 234 lbs yesterday. She states she is still having some personal stressors. She has increased her walking to 30 minutes 4 times per week. She reports she doesn't think she will be traveling in the next few weeks.  We were unable to weigh the patient today for this TeleHealth visit. She feels as if she has lost weight since her last visit. She has lost 7 lbs since starting treatment with [place].  Vitamin D deficiency Kathy Clark has a diagnosis of Vitamin D deficiency. She is currently taking prescription Vit D and denies nausea, vomiting or muscle weakness but does report fatigue.  Hyperlipidemia Kathy Clark has hyperlipidemia and has been trying to improve her cholesterol levels with intensive lifestyle modification including a low saturated fat diet, exercise and weight loss. Her last LDL was 124 on 02/14/2019, triglycerides 133, and an HDL of 57. She denies any chest pain, claudication or myalgias.  ASSESSMENT AND PLAN:  Other hyperlipidemia  Vitamin D deficiency - Plan: Vitamin D, Ergocalciferol, (DRISDOL) 1.25 MG (50000 UT) CAPS capsule  Class 2 severe obesity with serious comorbidity and body mass index (BMI) of 35.0 to 35.9 in adult, unspecified obesity type ([place])  PLAN:  Vitamin D Deficiency Kathy Clark was informed  that low Vitamin D levels contributes to fatigue and are associated with obesity, breast, and colon cancer. She agrees to continue to take prescription Vit D @ 50,000 IU every week #4 with 0 refills and will follow-up for routine testing of Vitamin D, at least 2-3 times per year. She was informed of the risk of over-replacement of Vitamin D and agrees to not increase her dose unless she discusses this with [place] first. Kathy Clark agrees to follow-up with our clinic in 2 weeks.  Hyperlipidemia Kathy Clark was informed of the American Heart Association Guidelines emphasizing intensive lifestyle modifications as the first line treatment for hyperlipidemia. We discussed many lifestyle modifications today in depth, and Kathy Clark will continue to work on decreasing saturated fats such as fatty red meat, butter and many fried foods. She will have repeat labs, increase vegetables and lean protein in her diet, and continue to work on exercise and weight loss efforts.  Obesity Kathy Clark is currently in the action stage of change. As such, her goal is to continue with weight loss efforts. She has agreed to keep a food journal with 1400 calories and 80+ grams of protein daily. Kathy Clark has been instructed to work up to a goal of 150 minutes of combined cardio and strengthening exercise per week for weight loss and overall health benefits. We discussed the following Behavioral Modification Strategies today: increasing lean protein intake, increasing vegetables, work on meal planning and easy cooking plans, better snacking choices, planning for success, and keep a strict food journal.  Kathy Clark has agreed to follow-up with our clinic in  2 weeks. She was informed of the importance of frequent follow-up visits to maximize her success with intensive lifestyle modifications for her multiple health conditions.  ALLERGIES: Allergies  Allergen Reactions  . Succinylcholine Anaphylaxis  . Dilaudid [Hydromorphone Hcl] Nausea And Vomiting     MEDICATIONS: Current Outpatient Medications on File Prior to Visit  Medication Sig Dispense Refill  . albuterol (PROVENTIL HFA;VENTOLIN HFA) 108 (90 Base) MCG/ACT inhaler Inhale 2 puffs into the lungs every 6 (six) hours as needed for wheezing or shortness of breath. 1 Inhaler 0  . AMBULATORY NON FORMULARY MEDICATION Medication Name: Please decrease CPAP setting to 8 cm of water pressure and fax [place] a download after 5 days.  Is having a lot of difficulty with air leaks is working to try to reduce her pressure to see if she is still being adequately treated but with fewer leaks.FAx to [place] 1 Units 0  . buPROPion (WELLBUTRIN SR) 150 MG 12 hr tablet Take 1 tablet (150 mg total) by mouth daily. 30 tablet 0  . Calcium Carbonate-Vitamin D (CALTRATE 600+D PO) Take 1 tablet by mouth daily.    . clonazePAM (KLONOPIN) 0.25 MG disintegrating tablet Take 1 tablet (0.25 mg total) by mouth daily as needed. 20 tablet 0  . EPINEPHrine 0.3 mg/0.3 mL IJ SOAJ injection Inject 0.3 mLs (0.3 mg total) into the muscle as needed for anaphylaxis. 1 Device 2  . estradiol (VIVELLE-DOT) 0.075 MG/24HR   3  . gabapentin (NEURONTIN) 300 MG capsule 2 caps p.o. twice daily for a week then increase dose as needed up to a maximum of 3600 mg daily (Patient taking differently: Take 300 mg by mouth 2 (two) times daily. 1 capsule twice daily) 180 capsule 3  . hydrochlorothiazide (MICROZIDE) 12.5 MG capsule TAKE 1 CAPSULE BY MOUTH DAILY. 90 capsule 3  . hyoscyamine (LEVBID) 0.375 MG 12 hr tablet Take 1 tablet (0.375 mg total) by mouth 2 (two) times daily. 60 tablet 0  . losartan (COZAAR) 25 MG tablet TAKE 1 TABLET BY MOUTH DAILY. 90 tablet 3  . Melatonin 3 MG TABS Take by mouth as directed.    . Multiple Vitamins-Minerals (MULTIVITAMIN ADULT PO) Take by mouth daily.    . SUMAtriptan (IMITREX) 20 MG/ACT nasal spray PLACE 1 SPRAY INTO THE NOSE EVERY 2 HOURS AS NEEDED FOR. HEADACHE 6 Inhaler 2  . VIIBRYD 20 MG TABS TAKE 1  TABLET BY MOUTH DAILY. 90 tablet 2   No current facility-administered medications on file prior to visit.     PAST MEDICAL HISTORY: Past Medical History:  Diagnosis Date  . Anxiety   . Back pain   . 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 Depression   . Essential hypertension, benign   . Gastritis   . GERD (gastroesophageal reflux disease)   . Hypertension   . Joint pain   . Lymphocytic colitis    microscopic  . Migraines   . Osteoarthritis   . Sciatica    right leg  . Swelling    feet, left foot    PAST SURGICAL HISTORY: Past Surgical History:  Procedure Laterality Date  . ABDOMINAL HYSTERECTOMY  07-2005  . [place] Bunionectomy Left 09/18/2016   Left Foot  . BACK SURGERY    . KNEE SURGERY  2006   left; multiple  . microdiscetomy  2005  . SHOULDER SURGERY  07-22-09   right  . SKIN CANCER EXCISION     eyelid  . TOTAL KNEE  ARTHROPLASTY  07/27/2011   Procedure: TOTAL KNEE ARTHROPLASTY;  Surgeon: Gearlean Alf;  Location: [facility];  Service: Orthopedics;  Laterality: Left;    SOCIAL HISTORY: Social History   Tobacco Use  . Smoking status: Never Smoker  . Smokeless tobacco: Never Used  Substance Use Topics  . Alcohol use: Yes    Comment: occasionally  . Drug use: No    FAMILY HISTORY: Family History  Problem Relation Age of Onset  . Breast cancer Unknown   . Tongue cancer Mother   . Breast cancer Mother   . Anxiety disorder Mother   . Depression Mother   . Heart disease Father   . Hypertension Father   . Hyperlipidemia Father   . Stroke Paternal Grandmother   . Colon cancer Neg Hx    ROS: Review of Systems  Constitutional: Positive for malaise/fatigue.  Gastrointestinal: Negative for nausea and vomiting.  Musculoskeletal:       Negative for muscle weakness.   PHYSICAL EXAM: Pt in no acute distress  RECENT LABS AND TESTS: BMET    Component Value Date/Time   NA 139 12/12/2018 0823   NA 142 08/18/2017 0940   K 4.5  12/12/2018 0823   CL 102 12/12/2018 0823   CO2 27 12/12/2018 0823   GLUCOSE 86 12/12/2018 0823   BUN 16 12/12/2018 0823   BUN 12 08/18/2017 0940   CREATININE 0.78 12/12/2018 0823   CALCIUM 9.1 12/12/2018 0823   GFRNONAA 84 12/12/2018 0823   GFRAA 97 12/12/2018 0823   Lab Results  Component Value Date   HGBA1C 5.1 02/14/2019   HGBA1C 5.1 12/01/2016   HGBA1C 4.9 08/03/2016   Lab Results  Component Value Date   INSULIN 5.4 12/01/2016   INSULIN 5.9 08/03/2016   CBC    Component Value Date/Time   WBC WILL FOLLOW 08/18/2017 0940   WBC 6.7 03/26/2016 1600   RBC WILL FOLLOW 08/18/2017 0940   RBC 4.68 03/26/2016 1600   HGB WILL FOLLOW 08/18/2017 0940   HCT WILL FOLLOW 08/18/2017 0940   PLT WILL FOLLOW 08/18/2017 0940   MCV WILL FOLLOW 08/18/2017 0940   MCH WILL FOLLOW 08/18/2017 0940   MCH 30.6 03/26/2016 1600   MCHC WILL FOLLOW 08/18/2017 0940   MCHC 33.6 03/26/2016 1600   RDW WILL FOLLOW 08/18/2017 0940   LYMPHSABS WILL FOLLOW 08/18/2017 0940   MONOABS 469 03/26/2016 1600   EOSABS WILL FOLLOW 08/18/2017 0940   BASOSABS WILL FOLLOW 08/18/2017 0940   Iron/TIBC/Ferritin/ %Sat    Component Value Date/Time   FERRITIN 142 12/25/2015 0912   Lipid Panel     Component Value Date/Time   CHOL 206 (H) 02/14/2019 0817   CHOL 196 05/02/2018 0943   TRIG 133 02/14/2019 0817   HDL 57 02/14/2019 0817   HDL 61 05/02/2018 0943   CHOLHDL 3.6 02/14/2019 0817   VLDL 21 04/05/2015 0937   LDLCALC 124 (H) 02/14/2019 0817   Hepatic Function Panel     Component Value Date/Time   PROT 6.7 12/12/2018 0823   PROT 6.9 08/18/2017 0940   ALBUMIN 4.4 08/18/2017 0940   AST 17 12/12/2018 0823   ALT 20 12/12/2018 0823   ALKPHOS 47 08/18/2017 0940   BILITOT 0.7 12/12/2018 0823   BILITOT 0.9 08/18/2017 0940   BILIDIR 0.1 12/25/2015 0912   IBILI 0.5 12/25/2015 0912      Component Value Date/Time   TSH 1.470 08/03/2016 1458   TSH 2.09 03/26/2016 1600   TSH 3.10 12/25/2015 0912  Results for DORE, OQUIN (MRN [id number]) as of 02/20/2019 12:01  Ref. Range 02/13/2019 09:15  Vitamin D, 25-Hydroxy Latest Ref Range: 30 - 100 ng/mL 29 (L)    I, Michaelene Song, am acting as Location manager for Ilene Qua, MD  I have reviewed the above documentation for accuracy and completeness, and I agree with the above. - Ilene Qua, MD

## 2019-02-24 ENCOUNTER — Encounter: Payer: Self-pay | Admitting: Family Medicine

## 2019-02-24 MED ORDER — VIIBRYD 40 MG PO TABS: 40.0000 mg | ORAL_TABLET | Freq: Every day | ORAL | 1 refills | Status: DC

## 2019-02-27 MED FILL — VIIBRYD 40 MG TABLET: 40 | 90 days supply | Qty: 90 | Fill #0

## 2019-02-28 ENCOUNTER — Other Ambulatory Visit: Payer: Self-pay

## 2019-02-28 ENCOUNTER — Ambulatory Visit (INDEPENDENT_AMBULATORY_CARE_PROVIDER_SITE_OTHER): Payer: 59 | Admitting: Family Medicine

## 2019-02-28 ENCOUNTER — Encounter (INDEPENDENT_AMBULATORY_CARE_PROVIDER_SITE_OTHER): Payer: Self-pay | Admitting: Family Medicine

## 2019-02-28 ENCOUNTER — Encounter: Payer: Self-pay | Admitting: Family Medicine

## 2019-02-28 DIAGNOSIS — F3289 Other specified depressive episodes: Secondary | ICD-10-CM

## 2019-02-28 DIAGNOSIS — Z6833 Body mass index (BMI) 33.0-33.9, adult: Secondary | ICD-10-CM

## 2019-02-28 DIAGNOSIS — E559 Vitamin D deficiency, unspecified: Secondary | ICD-10-CM | POA: Diagnosis not present

## 2019-02-28 DIAGNOSIS — E669 Obesity, unspecified: Secondary | ICD-10-CM

## 2019-02-28 MED ORDER — BUPROPION HCL ER (SR) 150 MG PO TB12: 150.0000 mg | ORAL_TABLET | Freq: Every day | ORAL | 0 refills | Status: DC

## 2019-02-28 MED FILL — BUPROPION HCL ER (SR) 150 M: 150 | 30 days supply | Qty: 30 | Fill #0

## 2019-02-28 NOTE — Telephone Encounter (Signed)
Hi Cindy, can you take a look at this.  We will try to get her in with dermatology little bit sooner.  If they are just completely booked up and there are no other options and that is perfectly fine.  I did let her know that it is really hard to get people in with dermatology right now.

## 2019-03-01 MED ORDER — CLOTRIMAZOLE-BETAMETHASONE 1-0.05 % EX CREA: 1.0000 "application " | TOPICAL_CREAM | Freq: Every evening | CUTANEOUS | 1 refills | Status: DC | PRN

## 2019-03-01 MED FILL — CLOTRIMAZOLE-BETAMETHASONE: 1-0.05 | 15 days supply | Qty: 30 | Fill #0

## 2019-03-06 NOTE — Progress Notes (Signed)
Office: [phone]  /  Fax: [phone] TeleHealth Visit:  Kathy Clark has verbally consented to this TeleHealth visit today. The patient is located at home, the provider is located at the [employer] [facility] office. The participants in this visit include the listed provider and patient. The visit was conducted today via webex.  HPI:   Chief Complaint: OBESITY Kathy Clark is here to discuss her progress with her obesity treatment plan. She is on the keep a food journal with 1400 calories and 80+ grams of protein daily and is following her eating plan approximately 75 % of the time. She states she is walking for 30 minutes 2-3 times per week. Kathy Clark voices she has had significantly more stress the past few weeks. She states that with increase in stress, she is often not hungry and not journaling. Last weight reported was 234 lbs. We were unable to weigh the patient today for this TeleHealth visit. She feels as if she has maintained her weight since her last visit. She has lost 7 lbs since starting treatment with [place].  Vitamin D Deficiency Kathy Clark has a diagnosis of vitamin D deficiency. She is currently taking prescription Vit D. She notes fatigue and denies nausea, vomiting or muscle weakness.  Depression with Emotional Eating Behaviors Kathy Clark notes symptoms are better controlled but she voices she has had more stress recently. Kathy Clark struggles with emotional eating and using food for comfort to the extent that it is negatively impacting her health. She often snacks when she is not hungry. Kathy Clark sometimes feels she is out of control and then feels guilty that she made poor food choices. She has been working on behavior modification techniques to help reduce her emotional eating and has been somewhat successful. She shows no sign of suicidal or homicidal ideations.  Depression screen [facility] 2/9 01/17/2019 11/28/2018 05/30/2018 12/10/2017 11/02/2017  Decreased Interest 1 1 2 1 1   Down, Depressed,  Hopeless 1 0 1 1 1   PHQ - 2 Score 2 1 3 2 2   Altered sleeping 1 1 3 2 3   Tired, decreased energy 1 1 3 2 1   Change in appetite 1 1 2 1 1   Feeling bad or failure about yourself  0 0 0 1 1  Trouble concentrating 1 0 - 1 -  Moving slowly or fidgety/restless 0 0 0 0 0  Suicidal thoughts 0 0 0 0 0  PHQ-9 Score 6 4 11 9 8   Difficult doing work/chores Somewhat difficult Somewhat difficult Somewhat difficult Somewhat difficult Somewhat difficult  Some recent data might be hidden    ASSESSMENT AND PLAN:  Vitamin D deficiency  Other depression - with emotional eating - Plan: buPROPion (WELLBUTRIN SR) 150 MG 12 hr tablet  Class 1 obesity with serious comorbidity and body mass index (BMI) of 33.0 to 33.9 in adult, unspecified obesity type  PLAN:  Vitamin D Deficiency Kathy Clark was informed that low vitamin D levels contributes to fatigue and are associated with obesity, breast, and colon cancer. Kathy Clark agrees to continue taking prescription Vit D 50,000 IU every week, no refill needed. She will follow up for routine testing of vitamin D, at least 2-3 times per year. She was informed of the risk of over-replacement of vitamin D and agrees to not increase her dose unless she discusses this with [place] first. Kathy Clark agrees to follow up with our clinic in 2 weeks.  Depression with Emotional Eating Behaviors We discussed behavior modification techniques today to help Kathy Clark deal with her  emotional eating and depression. Kathy Clark agrees to continue taking Wellbutrin SR 150 mg PO q daily #30 and we will refill for 1 month. Kathy Clark agrees to follow up with our clinic in 2 weeks.   Obesity Kathy Clark is currently in the action stage of change. As such, her goal is to continue with weight loss efforts She has agreed to keep a food journal with 1400 calories and 80+ grams of protein daily Kathy Clark has been instructed to work up to a goal of 150 minutes of combined cardio and strengthening exercise per week for weight loss and  overall health benefits. We discussed the following Behavioral Modification Strategies today: increasing lean protein intake, increasing vegetables and work on meal planning and easy cooking plans, keeping healthy foods in the home, and planning for success   Kathy Clark has agreed to follow up with our clinic in 2 weeks. She was informed of the importance of frequent follow up visits to maximize her success with intensive lifestyle modifications for her multiple health conditions.  ALLERGIES: Allergies  Allergen Reactions  . Succinylcholine Anaphylaxis  . Dilaudid [Hydromorphone Hcl] Nausea And Vomiting    MEDICATIONS: Current Outpatient Medications on File Prior to Visit  Medication Sig Dispense Refill  . albuterol (PROVENTIL HFA;VENTOLIN HFA) 108 (90 Base) MCG/ACT inhaler Inhale 2 puffs into the lungs every 6 (six) hours as needed for wheezing or shortness of breath. 1 Inhaler 0  . AMBULATORY NON FORMULARY MEDICATION Medication Name: Please decrease CPAP setting to 8 cm of water pressure and fax [place] a download after 5 days.  Is having a lot of difficulty with air leaks is working to try to reduce her pressure to see if she is still being adequately treated but with fewer leaks.FAx to [place] 1 Units 0  . Calcium Carbonate-Vitamin D (CALTRATE 600+D PO) Take 1 tablet by mouth daily.    . clonazePAM (KLONOPIN) 0.25 MG disintegrating tablet Take 1 tablet (0.25 mg total) by mouth daily as needed. 20 tablet 0  . EPINEPHrine 0.3 mg/0.3 mL IJ SOAJ injection Inject 0.3 mLs (0.3 mg total) into the muscle as needed for anaphylaxis. 1 Device 2  . estradiol (VIVELLE-DOT) 0.075 MG/24HR   3  . gabapentin (NEURONTIN) 300 MG capsule 2 caps p.o. twice daily for a week then increase dose as needed up to a maximum of 3600 mg daily (Patient taking differently: Take 300 mg by mouth 2 (two) times daily. 1 capsule twice daily) 180 capsule 3  . hydrochlorothiazide (MICROZIDE) 12.5 MG capsule TAKE 1 CAPSULE BY  MOUTH DAILY. 90 capsule 3  . hyoscyamine (LEVBID) 0.375 MG 12 hr tablet Take 1 tablet (0.375 mg total) by mouth 2 (two) times daily. 60 tablet 0  . losartan (COZAAR) 25 MG tablet TAKE 1 TABLET BY MOUTH DAILY. 90 tablet 3  . Melatonin 3 MG TABS Take by mouth as directed.    . Multiple Vitamins-Minerals (MULTIVITAMIN ADULT PO) Take by mouth daily.    . SUMAtriptan (IMITREX) 20 MG/ACT nasal spray PLACE 1 SPRAY INTO THE NOSE EVERY 2 HOURS AS NEEDED FOR. HEADACHE 6 Inhaler 2  . Vilazodone HCl (VIIBRYD) 40 MG TABS Take 1 tablet (40 mg total) by mouth daily. 90 tablet 1  . Vitamin D, Ergocalciferol, (DRISDOL) 1.25 MG (50000 UT) CAPS capsule Take 1 capsule (50,000 Units total) by mouth every 7 (seven) days. 4 capsule 0   No current facility-administered medications on file prior to visit.     PAST MEDICAL HISTORY: Past Medical History:  Diagnosis Date  . Anxiety   . Back pain   . 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 Depression   . Essential hypertension, benign   . Gastritis   . GERD (gastroesophageal reflux disease)   . Hypertension   . Joint pain   . Lymphocytic colitis    microscopic  . Migraines   . Osteoarthritis   . Sciatica    right leg  . Swelling    feet, left foot    PAST SURGICAL HISTORY: Past Surgical History:  Procedure Laterality Date  . ABDOMINAL HYSTERECTOMY  07-2005  . [place] Bunionectomy Left 09/18/2016   Left Foot  . BACK SURGERY    . KNEE SURGERY  2006   left; multiple  . microdiscetomy  2005  . SHOULDER SURGERY  07-22-09   right  . SKIN CANCER EXCISION     eyelid  . TOTAL KNEE ARTHROPLASTY  07/27/2011   Procedure: TOTAL KNEE ARTHROPLASTY;  Surgeon: Gearlean Alf;  Location: [facility];  Service: Orthopedics;  Laterality: Left;    SOCIAL HISTORY: Social History   Tobacco Use  . Smoking status: Never Smoker  . Smokeless tobacco: Never Used  Substance Use Topics  . Alcohol use: Yes    Comment: occasionally  . Drug use: No     FAMILY HISTORY: Family History  Problem Relation Age of Onset  . Breast cancer Unknown   . Tongue cancer Mother   . Breast cancer Mother   . Anxiety disorder Mother   . Depression Mother   . Heart disease Father   . Hypertension Father   . Hyperlipidemia Father   . Stroke Paternal Grandmother   . Colon cancer Neg Hx     ROS: Review of Systems  Constitutional: Positive for malaise/fatigue. Negative for weight loss.  Gastrointestinal: Negative for nausea and vomiting.  Musculoskeletal:       Negative muscle weakness  Psychiatric/Behavioral: Positive for depression. Negative for suicidal ideas.    PHYSICAL EXAM: Pt in no acute distress  RECENT LABS AND TESTS: BMET    Component Value Date/Time   NA 139 12/12/2018 0823   NA 142 08/18/2017 0940   K 4.5 12/12/2018 0823   CL 102 12/12/2018 0823   CO2 27 12/12/2018 0823   GLUCOSE 86 12/12/2018 0823   BUN 16 12/12/2018 0823   BUN 12 08/18/2017 0940   CREATININE 0.78 12/12/2018 0823   CALCIUM 9.1 12/12/2018 0823   GFRNONAA 84 12/12/2018 0823   GFRAA 97 12/12/2018 0823   Lab Results  Component Value Date   HGBA1C 5.1 02/14/2019   HGBA1C 5.1 12/01/2016   HGBA1C 4.9 08/03/2016   Lab Results  Component Value Date   INSULIN 5.4 12/01/2016   INSULIN 5.9 08/03/2016   CBC    Component Value Date/Time   WBC WILL FOLLOW 08/18/2017 0940   WBC 6.7 03/26/2016 1600   RBC WILL FOLLOW 08/18/2017 0940   RBC 4.68 03/26/2016 1600   HGB WILL FOLLOW 08/18/2017 0940   HCT WILL FOLLOW 08/18/2017 0940   PLT WILL FOLLOW 08/18/2017 0940   MCV WILL FOLLOW 08/18/2017 0940   MCH WILL FOLLOW 08/18/2017 0940   MCH 30.6 03/26/2016 1600   MCHC WILL FOLLOW 08/18/2017 0940   MCHC 33.6 03/26/2016 1600   RDW WILL FOLLOW 08/18/2017 0940   LYMPHSABS WILL FOLLOW 08/18/2017 0940   MONOABS 469 03/26/2016 1600   EOSABS WILL FOLLOW 08/18/2017 0940   BASOSABS WILL FOLLOW 08/18/2017 0940   Iron/TIBC/Ferritin/ %  Sat    Component Value Date/Time    FERRITIN 142 12/25/2015 0912   Lipid Panel     Component Value Date/Time   CHOL 206 (H) 02/14/2019 0817   CHOL 196 05/02/2018 0943   TRIG 133 02/14/2019 0817   HDL 57 02/14/2019 0817   HDL 61 05/02/2018 0943   CHOLHDL 3.6 02/14/2019 0817   VLDL 21 04/05/2015 0937   LDLCALC 124 (H) 02/14/2019 0817   Hepatic Function Panel     Component Value Date/Time   PROT 6.7 12/12/2018 0823   PROT 6.9 08/18/2017 0940   ALBUMIN 4.4 08/18/2017 0940   AST 17 12/12/2018 0823   ALT 20 12/12/2018 0823   ALKPHOS 47 08/18/2017 0940   BILITOT 0.7 12/12/2018 0823   BILITOT 0.9 08/18/2017 0940   BILIDIR 0.1 12/25/2015 0912   IBILI 0.5 12/25/2015 0912      Component Value Date/Time   TSH 1.470 08/03/2016 1458   TSH 2.09 03/26/2016 1600   TSH 3.10 12/25/2015 0912      I, Trixie Dredge, am acting as transcriptionist for Ilene Qua, MD  I have reviewed the above documentation for accuracy and completeness, and I agree with the above. - Ilene Qua, MD

## 2019-03-13 NOTE — Telephone Encounter (Signed)
Done - CF °

## 2019-03-16 ENCOUNTER — Encounter (INDEPENDENT_AMBULATORY_CARE_PROVIDER_SITE_OTHER): Payer: Self-pay | Admitting: Family Medicine

## 2019-03-16 ENCOUNTER — Other Ambulatory Visit: Payer: Self-pay

## 2019-03-16 ENCOUNTER — Encounter: Payer: Self-pay | Admitting: Family Medicine

## 2019-03-16 ENCOUNTER — Telehealth (INDEPENDENT_AMBULATORY_CARE_PROVIDER_SITE_OTHER): Payer: 59 | Admitting: Family Medicine

## 2019-03-16 DIAGNOSIS — E669 Obesity, unspecified: Secondary | ICD-10-CM

## 2019-03-16 DIAGNOSIS — E559 Vitamin D deficiency, unspecified: Secondary | ICD-10-CM

## 2019-03-16 DIAGNOSIS — F3289 Other specified depressive episodes: Secondary | ICD-10-CM | POA: Diagnosis not present

## 2019-03-16 DIAGNOSIS — G5602 Carpal tunnel syndrome, left upper limb: Secondary | ICD-10-CM | POA: Diagnosis not present

## 2019-03-16 DIAGNOSIS — Z6832 Body mass index (BMI) 32.0-32.9, adult: Secondary | ICD-10-CM | POA: Diagnosis not present

## 2019-03-16 MED ORDER — VITAMIN D (ERGOCALCIFEROL) 1.25 MG (50000 UNIT) PO CAPS: 50000.0000 [IU] | ORAL_CAPSULE | ORAL | 0 refills | Status: DC

## 2019-03-16 MED ORDER — BUPROPION HCL ER (SR) 150 MG PO TB12: 150.0000 mg | ORAL_TABLET | Freq: Every day | ORAL | 0 refills | Status: DC

## 2019-03-16 MED ORDER — CLONAZEPAM 0.5 MG PO TBDP: 0.5000 mg | ORAL_TABLET | Freq: Every day | ORAL | 0 refills | Status: DC | PRN

## 2019-03-16 MED FILL — VIT D2 1.25 MG (50,000 UNIT: 1.25 MG | 28 days supply | Qty: 4 | Fill #0

## 2019-03-16 MED FILL — clonazePAM 0.5 MG TBDP: 0.5 | 30 days supply | Qty: 30 | Fill #0

## 2019-03-21 NOTE — Progress Notes (Signed)
Office: [phone]  /  Fax: [phone] TeleHealth Visit:  Kathy Clark has verbally consented to this TeleHealth visit today. The patient is located at home, the provider is located at the [employer] [facility] office. The participants in this visit include the listed provider and patient. The visit was conducted today via webex.  HPI:   Chief Complaint: OBESITY Kathy Clark is here to discuss her progress with her obesity treatment plan. She is on the keep a food journal with 1400 calories and 80+ grams of protein daily and is following her eating plan approximately 90 % of the time. She states she is walking for 30 minutes 2 times per week. Kathy Clark is very emotional, crying throughout the appointment secondary to stress from her divorce and asset division. She has court tomorrow and her birthday tomorrow. She ate some salty foods yesterday, so her weight is back to where it was previously. Last weight reported was 234 lbs.  We were unable to weigh the patient today for this TeleHealth visit. She feels as if she has maintained her weight since her last visit. She has lost 7 lbs since starting treatment with [place].  Vitamin D Deficiency Kathy Clark has a diagnosis of vitamin D deficiency. She is currently taking prescription Vit D. She notes fatigue and denies nausea, vomiting or muscle weakness.  Depression with Emotional Eating Behaviors Kathy Clark had a recent increase of Kathy Clark to 40 mg. She notes slight improvement in sleep and pressured thoughts. She has an appointment with her counselor in the next week. Kathy Clark struggles with emotional eating and using food for comfort to the extent that it is negatively impacting her health. She often snacks when she is not hungry. Kathy Clark sometimes feels she is out of control and then feels guilty that she made poor food choices. She has been working on behavior modification techniques to help reduce her emotional eating and has been somewhat successful. She shows  no sign of suicidal or homicidal ideations.  Depression screen [facility] 2/9 01/17/2019 11/28/2018 05/30/2018 12/10/2017 11/02/2017  Decreased Interest 1 1 2 1 1   Down, Depressed, Hopeless 1 0 1 1 1   PHQ - 2 Score 2 1 3 2 2   Altered sleeping 1 1 3 2 3   Tired, decreased energy 1 1 3 2 1   Change in appetite 1 1 2 1 1   Feeling bad or failure about yourself  0 0 0 1 1  Trouble concentrating 1 0 - 1 -  Moving slowly or fidgety/restless 0 0 0 0 0  Suicidal thoughts 0 0 0 0 0  PHQ-9 Score 6 4 11 9 8   Difficult doing work/chores Somewhat difficult Somewhat difficult Somewhat difficult Somewhat difficult Somewhat difficult  Some recent data might be hidden    ASSESSMENT AND PLAN:  Class 1 obesity with serious comorbidity and body mass index (BMI) of 32.0 to 32.9 in adult, unspecified obesity type  Vitamin D deficiency - Plan: Vitamin D, Ergocalciferol, (DRISDOL) 1.25 MG (50000 UT) CAPS capsule  Other depression - with emotional eating - Plan: buPROPion (WELLBUTRIN SR) 150 MG 12 hr tablet  PLAN:  Vitamin D Deficiency Kathy Clark was informed that low vitamin D levels contributes to fatigue and are associated with obesity, breast, and colon cancer. Kathy Clark agrees to continue taking prescription Vit D 50,000 IU every week #4 and we will refill for 1 month. She will follow up for routine testing of vitamin D, at least 2-3 times per year. She was informed of the risk of  over-replacement of vitamin D and agrees to not increase her dose unless she discusses this with [place] first. Kathy Clark agrees to follow up with our clinic in 3 weeks.  Depression with Emotional Eating Behaviors We discussed behavior modification techniques today to help Kathy Clark deal with her emotional eating and depression. Kathy Clark agrees to continue taking Wellbutrin SR 150 mg PO q daily #30 and we will refill for 1 month. Kathy Clark agrees to follow up with our clinic in 3 weeks.  Obesity Kathy Clark is currently in the action stage of change. As such, her goal is  to continue with weight loss efforts She has agreed to keep a food journal with 1400 calories and 80+ grams of protein daily Kathy Clark has been instructed to work up to a goal of 150 minutes of combined cardio and strengthening exercise per week for weight loss and overall health benefits. We discussed the following Behavioral Modification Strategies today: increasing lean protein intake, increasing vegetables and work on meal planning and easy cooking plans, keeping healthy foods in the home, and planning for success   Kathy Clark has agreed to follow up with our clinic in 3 weeks. She was informed of the importance of frequent follow up visits to maximize her success with intensive lifestyle modifications for her multiple health conditions.  ALLERGIES: Allergies  Allergen Reactions   Succinylcholine Anaphylaxis   Dilaudid [Hydromorphone Hcl] Nausea And Vomiting    MEDICATIONS: Current Outpatient Medications on File Prior to Visit  Medication Sig Dispense Refill   albuterol (PROVENTIL HFA;VENTOLIN HFA) 108 (90 Base) MCG/ACT inhaler Inhale 2 puffs into the lungs every 6 (six) hours as needed for wheezing or shortness of breath. 1 Inhaler 0   AMBULATORY NON FORMULARY MEDICATION Medication Name: Please decrease CPAP setting to 8 cm of water pressure and fax [place] a download after 5 days.  Is having a lot of difficulty with air leaks is working to try to reduce her pressure to see if she is still being adequately treated but with fewer leaks.FAx to [place] 1 Units 0   Calcium Carbonate-Vitamin D (CALTRATE 600+D PO) Take 1 tablet by mouth daily.     clotrimazole-betamethasone (LOTRISONE) cream Apply 1 application topically at bedtime as needed. 30 g 1   EPINEPHrine 0.3 mg/0.3 mL IJ SOAJ injection Inject 0.3 mLs (0.3 mg total) into the muscle as needed for anaphylaxis. 1 Device 2   estradiol (VIVELLE-DOT) 0.075 MG/24HR   3   gabapentin (NEURONTIN) 300 MG capsule 2 caps p.o. twice daily for  a week then increase dose as needed up to a maximum of 3600 mg daily (Patient taking differently: Take 300 mg by mouth 2 (two) times daily. 1 capsule twice daily) 180 capsule 3   hydrochlorothiazide (MICROZIDE) 12.5 MG capsule TAKE 1 CAPSULE BY MOUTH DAILY. 90 capsule 3   hyoscyamine (LEVBID) 0.375 MG 12 hr tablet Take 1 tablet (0.375 mg total) by mouth 2 (two) times daily. 60 tablet 0   losartan (COZAAR) 25 MG tablet TAKE 1 TABLET BY MOUTH DAILY. 90 tablet 3   Melatonin 3 MG TABS Take by mouth as directed.     Multiple Vitamins-Minerals (MULTIVITAMIN ADULT PO) Take by mouth daily.     SUMAtriptan (IMITREX) 20 MG/ACT nasal spray PLACE 1 SPRAY INTO THE NOSE EVERY 2 HOURS AS NEEDED FOR. HEADACHE 6 Inhaler 2   Vilazodone HCl (VIIBRYD) 40 MG TABS Take 1 tablet (40 mg total) by mouth daily. 90 tablet 1   No current facility-administered medications on file  prior to visit.     PAST MEDICAL HISTORY: Past Medical History:  Diagnosis Date   Anxiety    Back pain    Complication of anesthesia    allergy to Succinylcholine   Depression    Essential hypertension, benign    Gastritis    GERD (gastroesophageal reflux disease)    Hypertension    Joint pain    Lymphocytic colitis    microscopic   Migraines    Osteoarthritis    Sciatica    right leg   Swelling    feet, left foot    PAST SURGICAL HISTORY: Past Surgical History:  Procedure Laterality Date   ABDOMINAL HYSTERECTOMY  07-2005   [place] Bunionectomy Left 09/18/2016   Left Foot   BACK SURGERY     KNEE SURGERY  2006   left; multiple   microdiscetomy  2005   SHOULDER SURGERY  07-22-09   right   SKIN CANCER EXCISION     eyelid   TOTAL KNEE ARTHROPLASTY  07/27/2011   Procedure: TOTAL KNEE ARTHROPLASTY;  Surgeon: Gearlean Alf;  Location: [facility];  Service: Orthopedics;  Laterality: Left;    SOCIAL HISTORY: Social History   Tobacco Use   Smoking status: Never Smoker   Smokeless tobacco: Never  Used  Substance Use Topics   Alcohol use: Yes    Comment: occasionally   Drug use: No    FAMILY HISTORY: Family History  Problem Relation Age of Onset   Breast cancer Unknown    Tongue cancer Mother    Breast cancer Mother    Anxiety disorder Mother    Depression Mother    Heart disease Father    Hypertension Father    Hyperlipidemia Father    Stroke Paternal Grandmother    Colon cancer Neg Hx     ROS: Review of Systems  Constitutional: Positive for malaise/fatigue. Negative for weight loss.  Gastrointestinal: Negative for nausea and vomiting.  Musculoskeletal:       Negative muscle weakness  Psychiatric/Behavioral: Positive for depression. Negative for suicidal ideas.    PHYSICAL EXAM: Pt in no acute distress  RECENT LABS AND TESTS: BMET    Component Value Date/Time   NA 139 12/12/2018 0823   NA 142 08/18/2017 0940   K 4.5 12/12/2018 0823   CL 102 12/12/2018 0823   CO2 27 12/12/2018 0823   GLUCOSE 86 12/12/2018 0823   BUN 16 12/12/2018 0823   BUN 12 08/18/2017 0940   CREATININE 0.78 12/12/2018 0823   CALCIUM 9.1 12/12/2018 0823   GFRNONAA 84 12/12/2018 0823   GFRAA 97 12/12/2018 0823   Lab Results  Component Value Date   HGBA1C 5.1 02/14/2019   HGBA1C 5.1 12/01/2016   HGBA1C 4.9 08/03/2016   Lab Results  Component Value Date   INSULIN 5.4 12/01/2016   INSULIN 5.9 08/03/2016   CBC    Component Value Date/Time   WBC WILL FOLLOW 08/18/2017 0940   WBC 6.7 03/26/2016 1600   RBC WILL FOLLOW 08/18/2017 0940   RBC 4.68 03/26/2016 1600   HGB WILL FOLLOW 08/18/2017 0940   HCT WILL FOLLOW 08/18/2017 0940   PLT WILL FOLLOW 08/18/2017 0940   MCV WILL FOLLOW 08/18/2017 0940   MCH WILL FOLLOW 08/18/2017 0940   MCH 30.6 03/26/2016 1600   MCHC WILL FOLLOW 08/18/2017 0940   MCHC 33.6 03/26/2016 1600   RDW WILL FOLLOW 08/18/2017 0940   LYMPHSABS WILL FOLLOW 08/18/2017 0940   MONOABS 469 03/26/2016 1600   EOSABS  WILL FOLLOW 08/18/2017 0940    BASOSABS WILL FOLLOW 08/18/2017 0940   Iron/TIBC/Ferritin/ %Sat    Component Value Date/Time   FERRITIN 142 12/25/2015 0912   Lipid Panel     Component Value Date/Time   CHOL 206 (H) 02/14/2019 0817   CHOL 196 05/02/2018 0943   TRIG 133 02/14/2019 0817   HDL 57 02/14/2019 0817   HDL 61 05/02/2018 0943   CHOLHDL 3.6 02/14/2019 0817   VLDL 21 04/05/2015 0937   LDLCALC 124 (H) 02/14/2019 0817   Hepatic Function Panel     Component Value Date/Time   PROT 6.7 12/12/2018 0823   PROT 6.9 08/18/2017 0940   ALBUMIN 4.4 08/18/2017 0940   AST 17 12/12/2018 0823   ALT 20 12/12/2018 0823   ALKPHOS 47 08/18/2017 0940   BILITOT 0.7 12/12/2018 0823   BILITOT 0.9 08/18/2017 0940   BILIDIR 0.1 12/25/2015 0912   IBILI 0.5 12/25/2015 0912      Component Value Date/Time   TSH 1.470 08/03/2016 1458   TSH 2.09 03/26/2016 1600   TSH 3.10 12/25/2015 0912      I, Trixie Dredge, am acting as transcriptionist for Ilene Qua, MD  I have reviewed the above documentation for accuracy and completeness, and I agree with the above. - Ilene Qua, MD

## 2019-03-23 MED FILL — GABAPENTIN 300 MG CAPSULE: 300 | 45 days supply | Qty: 180 | Fill #1

## 2019-03-24 DIAGNOSIS — G5602 Carpal tunnel syndrome, left upper limb: Secondary | ICD-10-CM | POA: Diagnosis not present

## 2019-03-24 MED FILL — BUPROPION HCL ER (SR) 150 M: 150 | 30 days supply | Qty: 30 | Fill #0

## 2019-03-24 MED FILL — oxyCODONE HCL 5 MG TABS: 5 | 3 days supply | Qty: 10 | Fill #0

## 2019-03-27 MED FILL — oxyCODONE HCL 5 MG TABS: 5 | 3 days supply | Qty: 10 | Fill #0

## 2019-04-01 DIAGNOSIS — Z803 Family history of malignant neoplasm of breast: Secondary | ICD-10-CM | POA: Diagnosis not present

## 2019-04-01 DIAGNOSIS — Z1231 Encounter for screening mammogram for malignant neoplasm of breast: Secondary | ICD-10-CM | POA: Diagnosis not present

## 2019-04-01 LAB — HM MAMMOGRAPHY

## 2019-04-04 ENCOUNTER — Encounter (INDEPENDENT_AMBULATORY_CARE_PROVIDER_SITE_OTHER): Payer: Self-pay | Admitting: Family Medicine

## 2019-04-04 ENCOUNTER — Ambulatory Visit (INDEPENDENT_AMBULATORY_CARE_PROVIDER_SITE_OTHER): Payer: 59 | Admitting: Family Medicine

## 2019-04-04 ENCOUNTER — Other Ambulatory Visit: Payer: Self-pay

## 2019-04-04 DIAGNOSIS — E559 Vitamin D deficiency, unspecified: Secondary | ICD-10-CM

## 2019-04-04 DIAGNOSIS — E669 Obesity, unspecified: Secondary | ICD-10-CM | POA: Diagnosis not present

## 2019-04-04 DIAGNOSIS — Z6832 Body mass index (BMI) 32.0-32.9, adult: Secondary | ICD-10-CM

## 2019-04-04 DIAGNOSIS — E7849 Other hyperlipidemia: Secondary | ICD-10-CM

## 2019-04-04 MED ORDER — VITAMIN D (ERGOCALCIFEROL) 1.25 MG (50000 UNIT) PO CAPS: 50000.0000 [IU] | ORAL_CAPSULE | ORAL | 0 refills | Status: DC

## 2019-04-05 MED FILL — SUMAtriptan 20 MG/ACT SOLN: 20 | 6 days supply | Qty: 6 | Fill #0

## 2019-04-06 NOTE — Progress Notes (Signed)
Office: [phone]  /  Fax: [phone] TeleHealth Visit:  Kathy Clark has verbally consented to this TeleHealth visit today. The patient is located at home, the provider is located at the [facility] office. The participants in this visit include the listed provider and patient. The visit was conducted today via webex.  HPI:   Chief Complaint: OBESITY Kathy Clark is here to discuss her progress with her obesity treatment plan. She is on the keep a food journal with 1400 calories and 80+ grams of protein daily and is following her eating plan approximately 20 % of the time. She states she is exercising 0 minutes 0 times per week. Kathy Clark has had a very busy stressful few weeks. She went to court, her father died, and she had right carpal tunnel surgery. She has been eating mostly take out. She maintained her weight.  We were unable to weigh the patient today for this TeleHealth visit. She feels as if she has maintained her weight since her last visit. She has lost 7 lbs since starting treatment with [place].  Vitamin D Deficiency Kathy Clark has a diagnosis of vitamin D deficiency. She is currently taking prescription Vit D. She notes fatigue and denies nausea, vomiting or muscle weakness.  Hyperlipidemia Kathy Clark has hyperlipidemia and has been trying to improve her cholesterol levels with intensive lifestyle modification including a low saturated fat diet, exercise and weight loss. Last LDL was of 124 and HDL of 57. She is not on statin and denies any chest pain, claudication or myalgias.  ASSESSMENT AND PLAN:  Other hyperlipidemia  Vitamin D deficiency - Plan: Vitamin D, Ergocalciferol, (DRISDOL) 1.25 MG (50000 UT) CAPS capsule  Class 1 obesity with serious comorbidity and body mass index (BMI) of 32.0 to 32.9 in adult, unspecified obesity type  PLAN:  Vitamin D Deficiency Kathy Clark was informed that low vitamin D levels contributes to fatigue and are associated with obesity, breast,  and colon cancer. Kathy Clark agrees to continue taking prescription Vit D 50,000 IU every week #4 and we will refill for 1 month. She will follow up for routine testing of vitamin D, at least 2-3 times per year. She was informed of the risk of over-replacement of vitamin D and agrees to not increase her dose unless she discusses this with [place] first. Avalynne agrees to follow up with our clinic in 2 weeks.  Hyperlipidemia Kathy Clark was informed of the American Heart Association Guidelines emphasizing intensive lifestyle modifications as the first line treatment for hyperlipidemia. We discussed many lifestyle modifications today in depth, and Kathy Clark will continue to work on decreasing saturated fats such as fatty red meat, butter and many fried foods. She will also increase vegetables and lean protein in her diet and continue to work on exercise and weight loss efforts. We will repeat labs in December.  Obesity Kathy Clark is currently in the action stage of change. As such, her goal is to continue with weight loss efforts She has agreed to keep a food journal with 1400 calories and 80+ grams of protein daily Kathy Clark has been instructed to work up to a goal of 150 minutes of combined cardio and strengthening exercise per week for weight loss and overall health benefits. We discussed the following Behavioral Modification Strategies today: increasing lean protein intake, increasing vegetables and work on meal planning and easy cooking plans, keeping healthy foods in the home, planning for success, and keep a strict food journal   Kathy Clark has agreed to follow up with our  clinic in 2 weeks. She was informed of the importance of frequent follow up visits to maximize her success with intensive lifestyle modifications for her multiple health conditions.  ALLERGIES: Allergies  Allergen Reactions  . Succinylcholine Anaphylaxis  . Dilaudid [Hydromorphone Hcl] Nausea And Vomiting    MEDICATIONS: Current Outpatient Medications on  File Prior to Visit  Medication Sig Dispense Refill  . albuterol (PROVENTIL HFA;VENTOLIN HFA) 108 (90 Base) MCG/ACT inhaler Inhale 2 puffs into the lungs every 6 (six) hours as needed for wheezing or shortness of breath. 1 Inhaler 0  . AMBULATORY NON FORMULARY MEDICATION Medication Name: Please decrease CPAP setting to 8 cm of water pressure and fax [place] a download after 5 days.  Is having a lot of difficulty with air leaks is working to try to reduce her pressure to see if she is still being adequately treated but with fewer leaks.FAx to [place] 1 Units 0  . buPROPion (WELLBUTRIN SR) 150 MG 12 hr tablet Take 1 tablet (150 mg total) by mouth daily. 30 tablet 0  . Calcium Carbonate-Vitamin D (CALTRATE 600+D PO) Take 1 tablet by mouth daily.    . clonazePAM (KLONOPIN) 0.5 MG disintegrating tablet Take 1 tablet (0.5 mg total) by mouth daily as needed. 30 tablet 0  . clotrimazole-betamethasone (LOTRISONE) cream Apply 1 application topically at bedtime as needed. 30 g 1  . EPINEPHrine 0.3 mg/0.3 mL IJ SOAJ injection Inject 0.3 mLs (0.3 mg total) into the muscle as needed for anaphylaxis. 1 Device 2  . estradiol (VIVELLE-DOT) 0.075 MG/24HR   3  . gabapentin (NEURONTIN) 300 MG capsule 2 caps p.o. twice daily for a week then increase dose as needed up to a maximum of 3600 mg daily (Patient taking differently: Take 300 mg by mouth 2 (two) times daily. 1 capsule twice daily) 180 capsule 3  . hydrochlorothiazide (MICROZIDE) 12.5 MG capsule TAKE 1 CAPSULE BY MOUTH DAILY. 90 capsule 3  . hyoscyamine (LEVBID) 0.375 MG 12 hr tablet Take 1 tablet (0.375 mg total) by mouth 2 (two) times daily. 60 tablet 0  . losartan (COZAAR) 25 MG tablet TAKE 1 TABLET BY MOUTH DAILY. 90 tablet 3  . Melatonin 3 MG TABS Take by mouth as directed.    . Multiple Vitamins-Minerals (MULTIVITAMIN ADULT PO) Take by mouth daily.    . SUMAtriptan (IMITREX) 20 MG/ACT nasal spray PLACE 1 SPRAY INTO THE NOSE EVERY 2 HOURS AS NEEDED FOR.  HEADACHE 6 Inhaler 2  . Vilazodone HCl (VIIBRYD) 40 MG TABS Take 1 tablet (40 mg total) by mouth daily. 90 tablet 1   No current facility-administered medications on file prior to visit.     PAST MEDICAL HISTORY: Past Medical History:  Diagnosis Date  . Anxiety   . Back pain   . 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 Depression   . Essential hypertension, benign   . Gastritis   . GERD (gastroesophageal reflux disease)   . Hypertension   . Joint pain   . Lymphocytic colitis    microscopic  . Migraines   . Osteoarthritis   . Sciatica    right leg  . Swelling    feet, left foot    PAST SURGICAL HISTORY: Past Surgical History:  Procedure Laterality Date  . ABDOMINAL HYSTERECTOMY  07-2005  . [place] Bunionectomy Left 09/18/2016   Left Foot  . BACK SURGERY    . KNEE SURGERY  2006   left; multiple  . microdiscetomy  2005  . SHOULDER SURGERY  07-22-09   right  . SKIN CANCER EXCISION     eyelid  . TOTAL KNEE ARTHROPLASTY  07/27/2011   Procedure: TOTAL KNEE ARTHROPLASTY;  Surgeon: Gearlean Alf;  Location: [facility];  Service: Orthopedics;  Laterality: Left;    SOCIAL HISTORY: Social History   Tobacco Use  . Smoking status: Never Smoker  . Smokeless tobacco: Never Used  Substance Use Topics  . Alcohol use: Yes    Comment: occasionally  . Drug use: No    FAMILY HISTORY: Family History  Problem Relation Age of Onset  . Breast cancer Unknown   . Tongue cancer Mother   . Breast cancer Mother   . Anxiety disorder Mother   . Depression Mother   . Heart disease Father   . Hypertension Father   . Hyperlipidemia Father   . Stroke Paternal Grandmother   . Colon cancer Neg Hx     ROS: Review of Systems  Constitutional: Positive for malaise/fatigue. Negative for weight loss.  Cardiovascular: Negative for chest pain and claudication.  Gastrointestinal: Negative for nausea and vomiting.  Musculoskeletal: Negative for myalgias.       Negative  muscle weakness    PHYSICAL EXAM: Pt in no acute distress  RECENT LABS AND TESTS: BMET    Component Value Date/Time   NA 139 12/12/2018 0823   NA 142 08/18/2017 0940   K 4.5 12/12/2018 0823   CL 102 12/12/2018 0823   CO2 27 12/12/2018 0823   GLUCOSE 86 12/12/2018 0823   BUN 16 12/12/2018 0823   BUN 12 08/18/2017 0940   CREATININE 0.78 12/12/2018 0823   CALCIUM 9.1 12/12/2018 0823   GFRNONAA 84 12/12/2018 0823   GFRAA 97 12/12/2018 0823   Lab Results  Component Value Date   HGBA1C 5.1 02/14/2019   HGBA1C 5.1 12/01/2016   HGBA1C 4.9 08/03/2016   Lab Results  Component Value Date   INSULIN 5.4 12/01/2016   INSULIN 5.9 08/03/2016   CBC    Component Value Date/Time   WBC WILL FOLLOW 08/18/2017 0940   WBC 6.7 03/26/2016 1600   RBC WILL FOLLOW 08/18/2017 0940   RBC 4.68 03/26/2016 1600   HGB WILL FOLLOW 08/18/2017 0940   HCT WILL FOLLOW 08/18/2017 0940   PLT WILL FOLLOW 08/18/2017 0940   MCV WILL FOLLOW 08/18/2017 0940   MCH WILL FOLLOW 08/18/2017 0940   MCH 30.6 03/26/2016 1600   MCHC WILL FOLLOW 08/18/2017 0940   MCHC 33.6 03/26/2016 1600   RDW WILL FOLLOW 08/18/2017 0940   LYMPHSABS WILL FOLLOW 08/18/2017 0940   MONOABS 469 03/26/2016 1600   EOSABS WILL FOLLOW 08/18/2017 0940   BASOSABS WILL FOLLOW 08/18/2017 0940   Iron/TIBC/Ferritin/ %Sat    Component Value Date/Time   FERRITIN 142 12/25/2015 0912   Lipid Panel     Component Value Date/Time   CHOL 206 (H) 02/14/2019 0817   CHOL 196 05/02/2018 0943   TRIG 133 02/14/2019 0817   HDL 57 02/14/2019 0817   HDL 61 05/02/2018 0943   CHOLHDL 3.6 02/14/2019 0817   VLDL 21 04/05/2015 0937   LDLCALC 124 (H) 02/14/2019 0817   Hepatic Function Panel     Component Value Date/Time   PROT 6.7 12/12/2018 0823   PROT 6.9 08/18/2017 0940   ALBUMIN 4.4 08/18/2017 0940   AST 17 12/12/2018 0823   ALT 20 12/12/2018 0823   ALKPHOS 47 08/18/2017 0940   BILITOT 0.7 12/12/2018 0823   BILITOT 0.9  08/18/2017 0940    BILIDIR 0.1 12/25/2015 0912   IBILI 0.5 12/25/2015 0912      Component Value Date/Time   TSH 1.470 08/03/2016 1458   TSH 2.09 03/26/2016 1600   TSH 3.10 12/25/2015 0912      I, Trixie Dredge, am acting as transcriptionist for Ilene Qua, MD  I have reviewed the above documentation for accuracy and completeness, and I agree with the above. - Ilene Qua, MD

## 2019-04-07 DIAGNOSIS — N6002 Solitary cyst of left breast: Secondary | ICD-10-CM | POA: Diagnosis not present

## 2019-04-07 DIAGNOSIS — N6001 Solitary cyst of right breast: Secondary | ICD-10-CM | POA: Diagnosis not present

## 2019-04-12 ENCOUNTER — Encounter (INDEPENDENT_AMBULATORY_CARE_PROVIDER_SITE_OTHER): Payer: Self-pay

## 2019-04-13 ENCOUNTER — Encounter (INDEPENDENT_AMBULATORY_CARE_PROVIDER_SITE_OTHER): Payer: Self-pay

## 2019-04-18 ENCOUNTER — Ambulatory Visit (INDEPENDENT_AMBULATORY_CARE_PROVIDER_SITE_OTHER): Payer: 59 | Admitting: Family Medicine

## 2019-04-18 ENCOUNTER — Other Ambulatory Visit: Payer: Self-pay

## 2019-04-18 DIAGNOSIS — E559 Vitamin D deficiency, unspecified: Secondary | ICD-10-CM | POA: Diagnosis not present

## 2019-04-18 DIAGNOSIS — E669 Obesity, unspecified: Secondary | ICD-10-CM | POA: Diagnosis not present

## 2019-04-18 DIAGNOSIS — F3289 Other specified depressive episodes: Secondary | ICD-10-CM | POA: Diagnosis not present

## 2019-04-18 DIAGNOSIS — Z6832 Body mass index (BMI) 32.0-32.9, adult: Secondary | ICD-10-CM | POA: Diagnosis not present

## 2019-04-18 MED ORDER — BUPROPION HCL ER (SR) 150 MG PO TB12: 150.0000 mg | ORAL_TABLET | Freq: Every day | ORAL | 0 refills | Status: DC

## 2019-04-18 MED FILL — BUPROPION HCL ER (SR) 150 M: 150 | 30 days supply | Qty: 30 | Fill #0

## 2019-04-19 DIAGNOSIS — L710 Perioral dermatitis: Secondary | ICD-10-CM | POA: Diagnosis not present

## 2019-04-19 DIAGNOSIS — D229 Melanocytic nevi, unspecified: Secondary | ICD-10-CM | POA: Diagnosis not present

## 2019-04-19 MED FILL — HYDROCORTISONE 2.5% OINT: 2.5 | 28 days supply | Qty: 28 | Fill #0

## 2019-04-19 NOTE — Progress Notes (Signed)
Office: [phone]  /  Fax: [phone] TeleHealth Visit:  Kathy Clark has verbally consented to this TeleHealth visit today. The patient is located at home, the provider is located at the [facility] office. The participants in this visit include the listed provider and patient. The visit was conducted today via webex.  HPI:   Chief Complaint: OBESITY Kathy Clark is here to discuss her progress with her obesity treatment plan. She is on the keep a food journal with 1400 calories and 80+ grams of protein daily and is following her eating plan approximately 75 % of the time. She states she is walking and packing boxes for moving for 20-30 minutes 6 times per week. Kathy Clark is still having some stressors, personal and familial. She has been walking more and trying to force herself to get out of the house. 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  We were unable to weigh the patient today for this TeleHealth visit. She feels as if she has lost 1 lb since her last visit. She has lost 7 lbs since starting treatment with [place].  Vitamin D Deficiency Kathy Clark has a diagnosis of vitamin D deficiency. She is currently taking prescription Vit D. She notes fatigue and denies nausea, vomiting or muscle weakness.  Depression with Emotional Eating Behaviors Kathy Clark is still having some cravings but more controlled. Kathy Clark struggles with emotional eating and using food for comfort to the extent that it is negatively impacting her health. She often snacks when she is not hungry. Kathy Clark sometimes feels she is out of control and then feels guilty that she made poor food choices. She has been working on behavior modification techniques to help reduce her emotional eating and has been somewhat successful. She shows no sign of suicidal or homicidal ideations.  Depression screen [facility] 2/9 11/16/2016 08/03/2016 01/21/2016 08/26/2015  Decreased Interest 2 3 0 1  Down, Depressed,  Hopeless 2 3 0 1  PHQ - 2 Score 4 6 0 2  Altered sleeping 3 3 2 2   Tired, decreased energy 3 3 2 2   Change in appetite 1 3 1  0  Feeling bad or failure about yourself  1 3 0 0  Trouble concentrating 2 2 1  0  Moving slowly or fidgety/restless 1 0 0 0  Suicidal thoughts 0 0 0 0  PHQ-9 Score 15 20 6 6   Difficult doing work/chores - - Somewhat difficult Somewhat difficult  Some encounter information is confidential and restricted. Go to Review Flowsheets activity to see all data.  Some recent data might be hidden    ASSESSMENT AND PLAN:  Vitamin D deficiency  Other depression - with emotional eating - Plan: buPROPion (WELLBUTRIN SR) 150 MG 12 hr tablet  Class 1 obesity with serious comorbidity and body mass index (BMI) of 32.0 to 32.9 in adult, unspecified obesity type  PLAN:  Vitamin D Deficiency Kathy Clark was informed that low vitamin D levels contributes to fatigue and are associated with obesity, breast, and colon cancer. Kathy Clark agrees to continue taking prescription Vit D 50,000 IU every week, no refill needed. She will follow up for routine testing of vitamin D, at least 2-3 times per year. She was informed of the risk of over-replacement of vitamin D and agrees to not increase her dose unless she discusses this with [place] first. Kathy Clark agrees to follow up with our clinic in 2 weeks.  Depression with Emotional Eating Behaviors We discussed behavior modification techniques  today to help Kathy Clark deal with her emotional eating and depression. Kathy Clark agrees to continue taking bupropion 150 mg PO daily #30 and we will refill for 1 month. Kathy Clark agrees to follow up with our clinic in 2 weeks.  Obesity Kathy Clark is currently in the action stage of change. As such, her goal is to continue with weight loss efforts She has agreed to keep a food journal with 1400 calories and 90+ grams of protein daily or follow a lower carbohydrate, vegetable and lean protein rich diet plan Kathy Clark has been instructed to work  up to a goal of 150 minutes of combined cardio and strengthening exercise per week for weight loss and overall health benefits. We discussed the following Behavioral Modification Strategies today: increasing lean protein intake, increasing vegetables and work on meal planning and easy cooking plans, keeping healthy foods in the home, planning for success, and keep a strict food journal   Kathy Clark has agreed to follow up with our clinic in 2 weeks with Abby Potash, PA-C. She was informed of the importance of frequent follow up visits to maximize her success with intensive lifestyle modifications for her multiple health conditions.  ALLERGIES: Allergies  Allergen Reactions  . Succinylcholine Anaphylaxis  . Dilaudid [Hydromorphone Hcl] Nausea And Vomiting    MEDICATIONS: Current Outpatient Medications on File Prior to Visit  Medication Sig Dispense Refill  . albuterol (PROVENTIL HFA;VENTOLIN HFA) 108 (90 Base) MCG/ACT inhaler Inhale 2 puffs into the lungs every 6 (six) hours as needed for wheezing or shortness of breath. 1 Inhaler 0  . AMBULATORY NON FORMULARY MEDICATION Medication Name: Please decrease CPAP setting to 8 cm of water pressure and fax [place] a download after 5 days.  Is having a lot of difficulty with air leaks is working to try to reduce her pressure to see if she is still being adequately treated but with fewer leaks.FAx to [place] 1 Units 0  . Calcium Carbonate-Vitamin D (CALTRATE 600+D PO) Take 1 tablet by mouth daily.    . clonazePAM (KLONOPIN) 0.5 MG disintegrating tablet Take 1 tablet (0.5 mg total) by mouth daily as needed. 30 tablet 0  . clotrimazole-betamethasone (LOTRISONE) cream Apply 1 application topically at bedtime as needed. 30 g 1  . EPINEPHrine 0.3 mg/0.3 mL IJ SOAJ injection Inject 0.3 mLs (0.3 mg total) into the muscle as needed for anaphylaxis. 1 Device 2  . estradiol (VIVELLE-DOT) 0.075 MG/24HR   3  . gabapentin (NEURONTIN) 300 MG capsule 2 caps p.o.  twice daily for a week then increase dose as needed up to a maximum of 3600 mg daily (Patient taking differently: Take 300 mg by mouth 2 (two) times daily. 1 capsule twice daily) 180 capsule 3  . hydrochlorothiazide (MICROZIDE) 12.5 MG capsule TAKE 1 CAPSULE BY MOUTH DAILY. 90 capsule 3  . hyoscyamine (LEVBID) 0.375 MG 12 hr tablet Take 1 tablet (0.375 mg total) by mouth 2 (two) times daily. 60 tablet 0  . losartan (COZAAR) 25 MG tablet TAKE 1 TABLET BY MOUTH DAILY. 90 tablet 3  . Melatonin 3 MG TABS Take by mouth as directed.    . Multiple Vitamins-Minerals (MULTIVITAMIN ADULT PO) Take by mouth daily.    . SUMAtriptan (IMITREX) 20 MG/ACT nasal spray PLACE 1 SPRAY INTO THE NOSE EVERY 2 HOURS AS NEEDED FOR. HEADACHE 6 Inhaler 2  . Vilazodone HCl (VIIBRYD) 40 MG TABS Take 1 tablet (40 mg total) by mouth daily. 90 tablet 1  . Vitamin D, Ergocalciferol, (DRISDOL) 1.25  MG (50000 UT) CAPS capsule Take 1 capsule (50,000 Units total) by mouth every 7 (seven) days. 4 capsule 0   No current facility-administered medications on file prior to visit.     PAST MEDICAL HISTORY: Past Medical History:  Diagnosis Date  . Anxiety   . Back pain   . 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 Depression   . Essential hypertension, benign   . Gastritis   . GERD (gastroesophageal reflux disease)   . Hypertension   . Joint pain   . Lymphocytic colitis    microscopic  . Migraines   . Osteoarthritis   . Sciatica    right leg  . Swelling    feet, left foot    PAST SURGICAL HISTORY: Past Surgical History:  Procedure Laterality Date  . ABDOMINAL HYSTERECTOMY  07-2005  . [place] Bunionectomy Left 09/18/2016   Left Foot  . BACK SURGERY    . KNEE SURGERY  2006   left; multiple  . microdiscetomy  2005  . SHOULDER SURGERY  07-22-09   right  . SKIN CANCER EXCISION     eyelid  . TOTAL KNEE ARTHROPLASTY  07/27/2011   Procedure: TOTAL KNEE ARTHROPLASTY;  Surgeon: Gearlean Alf;   Location: [facility];  Service: Orthopedics;  Laterality: Left;    SOCIAL HISTORY: Social History   Tobacco Use  . Smoking status: Never Smoker  . Smokeless tobacco: Never Used  Substance Use Topics  . Alcohol use: Yes    Comment: occasionally  . Drug use: No    FAMILY HISTORY: Family History  Problem Relation Age of Onset  . Breast cancer Unknown   . Tongue cancer Mother   . Breast cancer Mother   . Anxiety disorder Mother   . Depression Mother   . Heart disease Father   . Hypertension Father   . Hyperlipidemia Father   . Stroke Paternal Grandmother   . Colon cancer Neg Hx     ROS: Review of Systems  Constitutional: Positive for malaise/fatigue and weight loss.  Gastrointestinal: Negative for nausea and vomiting.  Musculoskeletal:       Negative muscle weakness  Psychiatric/Behavioral: Positive for depression. Negative for suicidal ideas.    PHYSICAL EXAM: Pt in no acute distress  RECENT LABS AND TESTS: BMET    Component Value Date/Time   NA 139 12/12/2018 0823   NA 142 08/18/2017 0940   K 4.5 12/12/2018 0823   CL 102 12/12/2018 0823   CO2 27 12/12/2018 0823   GLUCOSE 86 12/12/2018 0823   BUN 16 12/12/2018 0823   BUN 12 08/18/2017 0940   CREATININE 0.78 12/12/2018 0823   CALCIUM 9.1 12/12/2018 0823   GFRNONAA 84 12/12/2018 0823   GFRAA 97 12/12/2018 0823   Lab Results  Component Value Date   HGBA1C 5.1 02/14/2019   HGBA1C 5.1 12/01/2016   HGBA1C 4.9 08/03/2016   Lab Results  Component Value Date   INSULIN 5.4 12/01/2016   INSULIN 5.9 08/03/2016   CBC    Component Value Date/Time   WBC WILL FOLLOW 08/18/2017 0940   WBC 6.7 03/26/2016 1600   RBC WILL FOLLOW 08/18/2017 0940   RBC 4.68 03/26/2016 1600   HGB WILL FOLLOW 08/18/2017 0940   HCT WILL FOLLOW 08/18/2017 0940   PLT WILL FOLLOW 08/18/2017 0940   MCV WILL FOLLOW 08/18/2017 0940   MCH WILL FOLLOW 08/18/2017 0940   MCH 30.6 03/26/2016 1600   MCHC WILL FOLLOW 08/18/2017 0940  MCHC 33.6  03/26/2016 1600   RDW WILL FOLLOW 08/18/2017 0940   LYMPHSABS WILL FOLLOW 08/18/2017 0940   MONOABS 469 03/26/2016 1600   EOSABS WILL FOLLOW 08/18/2017 0940   BASOSABS WILL FOLLOW 08/18/2017 0940   Iron/TIBC/Ferritin/ %Sat    Component Value Date/Time   FERRITIN 142 12/25/2015 0912   Lipid Panel     Component Value Date/Time   CHOL 206 (A) 02/15/2019   CHOL 196 05/02/2018 0943   TRIG 133 02/15/2019   HDL 57 02/15/2019   HDL 61 05/02/2018 0943   CHOLHDL 3.6 02/14/2019 0817   VLDL 21 04/05/2015 0937   LDLCALC 124 02/15/2019   LDLCALC 124 (H) 02/14/2019 0817   Hepatic Function Panel     Component Value Date/Time   PROT 6.7 12/12/2018 0823   PROT 6.9 08/18/2017 0940   ALBUMIN 4.4 08/18/2017 0940   AST 17 12/12/2018 0823   ALT 20 12/12/2018 0823   ALKPHOS 47 08/18/2017 0940   BILITOT 0.7 12/12/2018 0823   BILITOT 0.9 08/18/2017 0940   BILIDIR 0.1 12/25/2015 0912   IBILI 0.5 12/25/2015 0912      Component Value Date/Time   TSH 1.470 08/03/2016 1458   TSH 2.09 03/26/2016 1600   TSH 3.10 12/25/2015 0912      I, Trixie Dredge, am acting as transcriptionist for Ilene Qua, MD  I have reviewed the above documentation for accuracy and completeness, and I agree with the above. - Ilene Qua, MD

## 2019-04-24 MED FILL — metroNIDAZOLE 0.75 % CREA: 0.75 | 42 days supply | Qty: 45 | Fill #0

## 2019-05-01 MED FILL — DOTTI 0.075 MG/24HR PTTW: 0.075 | 84 days supply | Qty: 24 | Fill #1

## 2019-05-02 ENCOUNTER — Encounter (INDEPENDENT_AMBULATORY_CARE_PROVIDER_SITE_OTHER): Payer: Self-pay | Admitting: Physician Assistant

## 2019-05-02 ENCOUNTER — Other Ambulatory Visit: Payer: Self-pay

## 2019-05-02 ENCOUNTER — Ambulatory Visit (INDEPENDENT_AMBULATORY_CARE_PROVIDER_SITE_OTHER): Payer: 59 | Admitting: Physician Assistant

## 2019-05-02 DIAGNOSIS — E669 Obesity, unspecified: Secondary | ICD-10-CM | POA: Diagnosis not present

## 2019-05-02 DIAGNOSIS — E559 Vitamin D deficiency, unspecified: Secondary | ICD-10-CM

## 2019-05-02 DIAGNOSIS — Z6834 Body mass index (BMI) 34.0-34.9, adult: Secondary | ICD-10-CM | POA: Diagnosis not present

## 2019-05-03 ENCOUNTER — Other Ambulatory Visit: Payer: Self-pay

## 2019-05-03 ENCOUNTER — Encounter: Payer: Self-pay | Admitting: Family Medicine

## 2019-05-03 ENCOUNTER — Ambulatory Visit (INDEPENDENT_AMBULATORY_CARE_PROVIDER_SITE_OTHER): Payer: 59 | Admitting: Family Medicine

## 2019-05-03 DIAGNOSIS — Z23 Encounter for immunization: Secondary | ICD-10-CM | POA: Diagnosis not present

## 2019-05-03 NOTE — Progress Notes (Signed)
Office: [phone]  /  Fax: [phone] TeleHealth Visit:  [name] has verbally consented to this TeleHealth visit today. The patient is located at home, the provider is located at the [facility] office. The participants in this visit include the listed provider and patient. The visit was conducted today via Webex (25 minutes).  HPI:   Chief Complaint: OBESITY Kathy Clark is here to discuss her progress with her obesity treatment plan. She is keeping a food journal with 1400 calories and 80 grams of protein and is following her eating plan approximately 80% of the time. She states she is walking 30 minutes 5 times per week. Kathy Clark's most recent weight was 239 lbs. She reports that she is working from home indefinitely. 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 She states she is going through a divorce and has not been on track, but is working hard to get back on the plan. We were unable to weigh the patient today for this TeleHealth visit. She feels as if she has lost weight since her last visit. She has lost 7 lbs since starting treatment with [place].  Vitamin D deficiency Kathy Clark has a diagnosis of Vitamin D deficiency. She is currently taking Vit D and denies nausea, vomiting or muscle weakness.  ASSESSMENT AND PLAN:  Vitamin D deficiency  Class 1 obesity with serious comorbidity and body mass index (BMI) of 34.0 to 34.9 in adult, unspecified obesity type  PLAN:  Vitamin D Deficiency Kathy Clark was informed that low Vitamin D levels contributes to fatigue and are associated with obesity, breast, and colon cancer. She agrees to continue taking Vit D and will follow-up for routine testing of Vitamin D, at least 2-3 times per year. She was informed of the risk of over-replacement of Vitamin D and agrees to not increase her dose unless she discusses this with [place] first. Kathy Clark agrees to follow-up with our clinic in 3 weeks.  Obesity Kathy Clark is currently in the action stage of change. As  such, her goal is to continue with weight loss efforts. She has agreed to keep a food journal with 1400 calories and 90 grams of protein.  Kathy Clark has been instructed to work up to a goal of 150 minutes of combined cardio and strengthening exercise per week for weight loss and overall health benefits. We discussed the following Behavioral Modification Strategies today: work on meal planning and easy cooking plans, and keeping healthy foods in the home.  Kathy Clark has agreed to follow-up with our clinic in 3 weeks. She was informed of the importance of frequent follow-up visits to maximize her success with intensive lifestyle modifications for her multiple health conditions.  I spent > than 50% of the 25 minute visit on counseling as documented in the note.    ALLERGIES: Allergies  Allergen Reactions   Succinylcholine Anaphylaxis   Dilaudid [Hydromorphone Hcl] Nausea And Vomiting    MEDICATIONS: Current Outpatient Medications on File Prior to Visit  Medication Sig Dispense Refill   albuterol (PROVENTIL HFA;VENTOLIN HFA) 108 (90 Base) MCG/ACT inhaler Inhale 2 puffs into the lungs every 6 (six) hours as needed for wheezing or shortness of breath. 1 Inhaler 0   AMBULATORY NON FORMULARY MEDICATION Medication Name: Please decrease CPAP setting to 8 cm of water pressure and fax [place] a download after 5 days.  Is having a lot of difficulty with air leaks is working to try to reduce her pressure to see if she is still being adequately treated but  with fewer leaks.FAx to [place] 1 Units 0   buPROPion (WELLBUTRIN SR) 150 MG 12 hr tablet Take 1 tablet (150 mg total) by mouth daily. 30 tablet 0   Calcium Carbonate-Vitamin D (CALTRATE 600+D PO) Take 1 tablet by mouth daily.     clonazePAM (KLONOPIN) 0.5 MG disintegrating tablet Take 1 tablet (0.5 mg total) by mouth daily as needed. 30 tablet 0   clotrimazole-betamethasone (LOTRISONE) cream Apply 1 application topically at bedtime as needed. 30  g 1   EPINEPHrine 0.3 mg/0.3 mL IJ SOAJ injection Inject 0.3 mLs (0.3 mg total) into the muscle as needed for anaphylaxis. 1 Device 2   estradiol (VIVELLE-DOT) 0.075 MG/24HR   3   gabapentin (NEURONTIN) 300 MG capsule 2 caps p.o. twice daily for a week then increase dose as needed up to a maximum of 3600 mg daily (Patient taking differently: Take 300 mg by mouth 2 (two) times daily. 1 capsule twice daily) 180 capsule 3   hydrochlorothiazide (MICROZIDE) 12.5 MG capsule TAKE 1 CAPSULE BY MOUTH DAILY. 90 capsule 3   hyoscyamine (LEVBID) 0.375 MG 12 hr tablet Take 1 tablet (0.375 mg total) by mouth 2 (two) times daily. 60 tablet 0   losartan (COZAAR) 25 MG tablet TAKE 1 TABLET BY MOUTH DAILY. 90 tablet 3   Melatonin 3 MG TABS Take by mouth as directed.     Multiple Vitamins-Minerals (MULTIVITAMIN ADULT PO) Take by mouth daily.     SUMAtriptan (IMITREX) 20 MG/ACT nasal spray PLACE 1 SPRAY INTO THE NOSE EVERY 2 HOURS AS NEEDED FOR. HEADACHE 6 Inhaler 2   Vilazodone HCl (VIIBRYD) 40 MG TABS Take 1 tablet (40 mg total) by mouth daily. 90 tablet 1   Vitamin D, Ergocalciferol, (DRISDOL) 1.25 MG (50000 UT) CAPS capsule Take 1 capsule (50,000 Units total) by mouth every 7 (seven) days. 4 capsule 0   No current facility-administered medications on file prior to visit.     PAST MEDICAL HISTORY: Past Medical History:  Diagnosis Date   Anxiety    Back pain    Complication of anesthesia    allergy to Succinylcholine   Depression    Essential hypertension, benign    Gastritis    GERD (gastroesophageal reflux disease)    Hypertension    Joint pain    Lymphocytic colitis    microscopic   Migraines    Osteoarthritis    Sciatica    right leg   Swelling    feet, left foot    PAST SURGICAL HISTORY: Past Surgical History:  Procedure Laterality Date   ABDOMINAL HYSTERECTOMY  07-2005   [place] Bunionectomy Left 09/18/2016   Left Foot   BACK SURGERY     KNEE SURGERY   2006   left; multiple   microdiscetomy  2005   SHOULDER SURGERY  07-22-09   right   SKIN CANCER EXCISION     eyelid   TOTAL KNEE ARTHROPLASTY  07/27/2011   Procedure: TOTAL KNEE ARTHROPLASTY;  Surgeon: Gearlean Alf;  Location: [facility];  Service: Orthopedics;  Laterality: Left;    SOCIAL HISTORY: Social History   Tobacco Use   Smoking status: Never Smoker   Smokeless tobacco: Never Used  Substance Use Topics   Alcohol use: Yes    Comment: occasionally   Drug use: No    FAMILY HISTORY: Family History  Problem Relation Age of Onset   Breast cancer Unknown    Tongue cancer Mother    Breast cancer Mother  Anxiety disorder Mother    Depression Mother    Heart disease Father    Hypertension Father    Hyperlipidemia Father    Stroke Paternal Grandmother    Colon cancer Neg Hx    ROS: Review of Systems  Gastrointestinal: Negative for nausea and vomiting.  Musculoskeletal:       Negative for muscle weakness.   PHYSICAL EXAM: Pt in no acute distress  RECENT LABS AND TESTS: BMET    Component Value Date/Time   NA 139 12/12/2018 0823   NA 142 08/18/2017 0940   K 4.5 12/12/2018 0823   CL 102 12/12/2018 0823   CO2 27 12/12/2018 0823   GLUCOSE 86 12/12/2018 0823   BUN 16 12/12/2018 0823   BUN 12 08/18/2017 0940   CREATININE 0.78 12/12/2018 0823   CALCIUM 9.1 12/12/2018 0823   GFRNONAA 84 12/12/2018 0823   GFRAA 97 12/12/2018 0823   Lab Results  Component Value Date   HGBA1C 5.1 02/14/2019   HGBA1C 5.1 12/01/2016   HGBA1C 4.9 08/03/2016   Lab Results  Component Value Date   INSULIN 5.4 12/01/2016   INSULIN 5.9 08/03/2016   CBC    Component Value Date/Time   WBC WILL FOLLOW 08/18/2017 0940   WBC 6.7 03/26/2016 1600   RBC WILL FOLLOW 08/18/2017 0940   RBC 4.68 03/26/2016 1600   HGB WILL FOLLOW 08/18/2017 0940   HCT WILL FOLLOW 08/18/2017 0940   PLT WILL FOLLOW 08/18/2017 0940   MCV WILL FOLLOW 08/18/2017 0940   MCH WILL FOLLOW  08/18/2017 0940   MCH 30.6 03/26/2016 1600   MCHC WILL FOLLOW 08/18/2017 0940   MCHC 33.6 03/26/2016 1600   RDW WILL FOLLOW 08/18/2017 0940   LYMPHSABS WILL FOLLOW 08/18/2017 0940   MONOABS 469 03/26/2016 1600   EOSABS WILL FOLLOW 08/18/2017 0940   BASOSABS WILL FOLLOW 08/18/2017 0940   Iron/TIBC/Ferritin/ %Sat    Component Value Date/Time   FERRITIN 142 12/25/2015 0912   Lipid Panel     Component Value Date/Time   CHOL 206 (A) 02/15/2019   CHOL 196 05/02/2018 0943   TRIG 133 02/15/2019   HDL 57 02/15/2019   HDL 61 05/02/2018 0943   CHOLHDL 3.6 02/14/2019 0817   VLDL 21 04/05/2015 0937   LDLCALC 124 02/15/2019   LDLCALC 124 (H) 02/14/2019 0817   Hepatic Function Panel     Component Value Date/Time   PROT 6.7 12/12/2018 0823   PROT 6.9 08/18/2017 0940   ALBUMIN 4.4 08/18/2017 0940   AST 17 12/12/2018 0823   ALT 20 12/12/2018 0823   ALKPHOS 47 08/18/2017 0940   BILITOT 0.7 12/12/2018 0823   BILITOT 0.9 08/18/2017 0940   BILIDIR 0.1 12/25/2015 0912   IBILI 0.5 12/25/2015 0912      Component Value Date/Time   TSH 1.470 08/03/2016 1458   TSH 2.09 03/26/2016 1600   TSH 3.10 12/25/2015 0912   Results for BETTYLU, KIERCE (MRN [id number]) as of 05/03/2019 15:08  Ref. Range 02/13/2019 09:15  Vitamin D, 25-Hydroxy Latest Ref Range: 30 - 100 ng/mL 29 (L)   I, Michaelene Song, am acting as Location manager for [employer], PA-C I, Abby Potash, PA-C have reviewed above note and agree with its content

## 2019-05-05 ENCOUNTER — Telehealth: Payer: 59 | Admitting: Nurse Practitioner

## 2019-05-05 DIAGNOSIS — R05 Cough: Secondary | ICD-10-CM

## 2019-05-05 DIAGNOSIS — Z20828 Contact with and (suspected) exposure to other viral communicable diseases: Secondary | ICD-10-CM | POA: Diagnosis not present

## 2019-05-05 DIAGNOSIS — Z20822 Contact with and (suspected) exposure to covid-19: Secondary | ICD-10-CM

## 2019-05-05 DIAGNOSIS — R059 Cough, unspecified: Secondary | ICD-10-CM

## 2019-05-05 MED ORDER — PROMETHAZINE-DM 6.25-15 MG/5ML PO SYRP: 5.0000 mL | ORAL_SOLUTION | Freq: Four times a day (QID) | ORAL | 0 refills | Status: AC | PRN

## 2019-05-05 MED FILL — PROMETHAZINE W/DM SYRUP: 6.25-15 | 7 days supply | Qty: 140 | Fill #0

## 2019-05-05 NOTE — Progress Notes (Signed)
E-Visit for Corona Virus Screening   Your current symptoms could be consistent with the coronavirus.  Many health care providers can now test patients at their office but not all are.  [place] has multiple testing sites. For information on our COVID testing locations and hours go to [email or web address]  Please quarantine yourself while awaiting your test results.  We are enrolling you in our [place] for [place] . Daily you will receive a questionnaire within the [place] website. Our COVID 19 response team willl be monitoriing your responses daily. I recommend COVID-19 testing to rule out the virus even if you are starting to feel better.  Also, individuals can be asymptomatic and still test positive.     COVID-19 is a respiratory illness with symptoms that are similar to the flu. Symptoms are typically mild to moderate, but there have been cases of severe illness and death due to the virus. The following symptoms may appear 2-14 days after exposure: . Fever . Cough . Shortness of breath or difficulty breathing . Chills . Repeated shaking with chills . Muscle pain . Headache . Sore throat . New loss of taste or smell . Fatigue . Congestion or runny nose . Nausea or vomiting . Diarrhea  It is vitally important that if you feel that you have an infection such as this virus or any other virus that you stay home and away from places where you may spread it to others.  You should self-quarantine for 14 days if you have symptoms that could potentially be coronavirus or have been in close contact a with a person diagnosed with COVID-19 within the last 2 weeks. You should avoid contact with people age 57 and older.   You should wear a mask or cloth face covering over your nose and mouth if you must be around other people or animals, including pets (even at home). Try to stay at least 6 feet away from other people. This will protect the people around  you.  You can use medication such as A prescription cough medication called Phenergan DM 6.25 mg/15 mg. You make take one teaspoon / 5 ml every 4-6 hours as needed for cough.  You may also take acetaminophen (Tylenol) as needed for fever.   Reduce your risk of any infection by using the same precautions used for avoiding the common cold or flu:  [name] Wash your hands often with soap and warm water for at least 20 seconds.  If soap and water are not readily available, use an alcohol-based hand sanitizer with at least 60% alcohol.  . If coughing or sneezing, cover your mouth and nose by coughing or sneezing into the elbow areas of your shirt or coat, into a tissue or into your sleeve (not your hands). . Avoid shaking hands with others and consider head nods or verbal greetings only. . Avoid touching your eyes, nose, or mouth with unwashed hands.  . Avoid close contact with people who are sick. . Avoid places or events with large numbers of people in one location, like concerts or sporting events. . Carefully consider travel plans you have or are making. . If you are planning any travel outside or inside the [place], visit the CDC's Travelers' Health webpage for the latest health notices. . If you have some symptoms but not all symptoms, continue to monitor at home and seek medical attention if your symptoms worsen. . If you are having a medical emergency, call 911.  HOME CARE . Only  take medications as instructed by your medical team. . Drink plenty of fluids and get plenty of rest. . A steam or ultrasonic humidifier can help if you have congestion.   GET HELP RIGHT AWAY IF YOU HAVE EMERGENCY WARNING SIGNS** FOR COVID-19. If you or someone is showing any of these signs seek emergency medical care immediately. Call 911 or proceed to your closest emergency facility if: . You develop worsening high fever. . Trouble breathing . Bluish lips or face . Persistent pain or pressure in the chest . New  confusion . Inability to wake or stay awake . You cough up blood. . Your symptoms become more severe  **This list is not all possible symptoms. Contact your medical provider for any symptoms that are sever or concerning to you.   MAKE SURE YOU   Understand these instructions.  Will watch your condition.  Will get help right away if you are not doing well or get worse.  Your e-visit answers were reviewed by a board certified advanced clinical practitioner to complete your personal care plan.  Depending on the condition, your plan could have included both over the counter or prescription medications.  If there is a problem please reply once you have received a response from your provider.  Your safety is important to [place].  If you have drug allergies check your prescription carefully.    You can use MyChart to ask questions about today's visit, request a non-urgent call back, or ask for a work or school excuse for 24 hours related to this e-Visit. If it has been greater than 24 hours you will need to follow up with your provider, or enter a new e-Visit to address those concerns. You will get an e-mail in the next two days asking about your experience.  I hope that your e-visit has been valuable and will speed your recovery. Thank you for using e-visits.  I have spent 5 to 10 minutes reviewing and documenting in the patient's chart.

## 2019-05-06 ENCOUNTER — Encounter (INDEPENDENT_AMBULATORY_CARE_PROVIDER_SITE_OTHER): Payer: Self-pay

## 2019-05-07 ENCOUNTER — Encounter (INDEPENDENT_AMBULATORY_CARE_PROVIDER_SITE_OTHER): Payer: Self-pay

## 2019-05-08 NOTE — Telephone Encounter (Signed)
Headache and nausea still, but diarrhea has cleared up. Has dry cough she believes is from allergies but no fever. States testing through [facility] was negative.

## 2019-05-08 NOTE — Telephone Encounter (Signed)
Please call pt and see how she is doing and feeling and if has heard back on her COVID test yet.

## 2019-05-11 ENCOUNTER — Encounter (INDEPENDENT_AMBULATORY_CARE_PROVIDER_SITE_OTHER): Payer: Self-pay

## 2019-05-11 DIAGNOSIS — G4733 Obstructive sleep apnea (adult) (pediatric): Secondary | ICD-10-CM | POA: Diagnosis not present

## 2019-05-13 ENCOUNTER — Encounter (INDEPENDENT_AMBULATORY_CARE_PROVIDER_SITE_OTHER): Payer: Self-pay

## 2019-05-18 ENCOUNTER — Encounter (INDEPENDENT_AMBULATORY_CARE_PROVIDER_SITE_OTHER): Payer: Self-pay

## 2019-05-18 MED FILL — MINOCYCLINE 50 MG CAPSULE: 50 | 30 days supply | Qty: 60 | Fill #0

## 2019-05-18 MED FILL — HYDROCHLOROTHIAZIDE 12.5 MG: 12.5 | 90 days supply | Qty: 90 | Fill #1

## 2019-05-18 MED FILL — LOSARTAN POTASSIUM 25 MG TA: 25 | 90 days supply | Qty: 90 | Fill #1

## 2019-05-20 ENCOUNTER — Telehealth: Payer: 59 | Admitting: Family

## 2019-05-20 DIAGNOSIS — Z20822 Contact with and (suspected) exposure to covid-19: Secondary | ICD-10-CM

## 2019-05-20 DIAGNOSIS — Z20828 Contact with and (suspected) exposure to other viral communicable diseases: Secondary | ICD-10-CM | POA: Diagnosis not present

## 2019-05-20 NOTE — Progress Notes (Signed)
E-Visit for [employer], we can get you tested for COVID on Monday with the PCR test. For now, we need to have you quarantine until you are tested.   Your current symptoms could be consistent with the coronavirus.  Many health care providers can now test patients at their office but not all are.  [place] has multiple testing sites. For information on our COVID testing locations and hours go to [email or web address]  Please quarantine yourself while awaiting your test results.  We are enrolling you in our [place] for [place] . Daily you will receive a questionnaire within the [place] website. Our COVID 19 response team willl be monitoriing your responses daily.    COVID-19 is a respiratory illness with symptoms that are similar to the flu. Symptoms are typically mild to moderate, but there have been cases of severe illness and death due to the virus. The following symptoms may appear 2-14 days after exposure: . Fever . Cough . Shortness of breath or difficulty breathing . Chills . Repeated shaking with chills . Muscle pain . Headache . Sore throat . New loss of taste or smell . Fatigue . Congestion or runny nose . Nausea or vomiting . Diarrhea  It is vitally important that if you feel that you have an infection such as this virus or any other virus that you stay home and away from places where you may spread it to others.  You should self-quarantine for 14 days if you have symptoms that could potentially be coronavirus or have been in close contact a with a person diagnosed with COVID-19 within the last 2 weeks. You should avoid contact with people age 90 and older.   You should wear a mask or cloth face covering over your nose and mouth if you must be around other people or animals, including pets (even at home). Try to stay at least 6 feet away from other people. This will protect the people around you.    You may also  take acetaminophen (Tylenol) as needed for fever.   Reduce your risk of any infection by using the same precautions used for avoiding the common cold or flu:  [name] Wash your hands often with soap and warm water for at least 20 seconds.  If soap and water are not readily available, use an alcohol-based hand sanitizer with at least 60% alcohol.  . If coughing or sneezing, cover your mouth and nose by coughing or sneezing into the elbow areas of your shirt or coat, into a tissue or into your sleeve (not your hands). . Avoid shaking hands with others and consider head nods or verbal greetings only. . Avoid touching your eyes, nose, or mouth with unwashed hands.  . Avoid close contact with people who are sick. . Avoid places or events with large numbers of people in one location, like concerts or sporting events. . Carefully consider travel plans you have or are making. . If you are planning any travel outside or inside the [place], visit the CDC's Travelers' Health webpage for the latest health notices. . If you have some symptoms but not all symptoms, continue to monitor at home and seek medical attention if your symptoms worsen. . If you are having a medical emergency, call 911.  HOME CARE . Only take medications as instructed by your medical team. . Drink plenty of fluids and get plenty of rest. . A steam or ultrasonic humidifier can help if you have congestion.  GET HELP RIGHT AWAY IF YOU HAVE EMERGENCY WARNING SIGNS** FOR COVID-19. If you or someone is showing any of these signs seek emergency medical care immediately. Call 911 or proceed to your closest emergency facility if: . You develop worsening high fever. . Trouble breathing . Bluish lips or face . Persistent pain or pressure in the chest . New confusion . Inability to wake or stay awake . You cough up blood. . Your symptoms become more severe  **This list is not all possible symptoms. Contact your medical provider for any symptoms  that are sever or concerning to you.   MAKE SURE YOU   Understand these instructions.  Will watch your condition.  Will get help right away if you are not doing well or get worse.  Your e-visit answers were reviewed by a board certified advanced clinical practitioner to complete your personal care plan.  Depending on the condition, your plan could have included both over the counter or prescription medications.  If there is a problem please reply once you have received a response from your provider.  Your safety is important to [place].  If you have drug allergies check your prescription carefully.    You can use MyChart to ask questions about today's visit, request a non-urgent call back, or ask for a work or school excuse for 24 hours related to this e-Visit. If it has been greater than 24 hours you will need to follow up with your provider, or enter a new e-Visit to address those concerns. You will get an e-mail in the next two days asking about your experience.  I hope that your e-visit has been valuable and will speed your recovery. Thank you for using e-visits.   Greater than 5 minutes, yet less than 10 minutes of time have been spent researching, coordinating, and implementing care for this patient today.  Thank you for the details you included in the comment boxes. Those details are very helpful in determining the best course of treatment for you and help [place] to provide the best care.

## 2019-05-22 ENCOUNTER — Telehealth (INDEPENDENT_AMBULATORY_CARE_PROVIDER_SITE_OTHER): Payer: 59 | Admitting: Physician Assistant

## 2019-05-22 ENCOUNTER — Other Ambulatory Visit: Payer: Self-pay

## 2019-05-22 ENCOUNTER — Encounter (INDEPENDENT_AMBULATORY_CARE_PROVIDER_SITE_OTHER): Payer: Self-pay | Admitting: Physician Assistant

## 2019-05-22 DIAGNOSIS — Z6835 Body mass index (BMI) 35.0-35.9, adult: Secondary | ICD-10-CM

## 2019-05-22 DIAGNOSIS — E559 Vitamin D deficiency, unspecified: Secondary | ICD-10-CM | POA: Diagnosis not present

## 2019-05-22 DIAGNOSIS — F3289 Other specified depressive episodes: Secondary | ICD-10-CM | POA: Diagnosis not present

## 2019-05-22 MED ORDER — VITAMIN D (ERGOCALCIFEROL) 1.25 MG (50000 UNIT) PO CAPS: 50000.0000 [IU] | ORAL_CAPSULE | ORAL | 0 refills | Status: DC

## 2019-05-22 MED ORDER — BUPROPION HCL ER (SR) 150 MG PO TB12: 150.0000 mg | ORAL_TABLET | Freq: Every day | ORAL | 0 refills | Status: DC

## 2019-05-22 MED FILL — VIT D2 1.25 MG (50,000 UNIT: 1.25 MG | 28 days supply | Qty: 4 | Fill #0

## 2019-05-22 MED FILL — BUPROPION HCL ER (SR) 150 M: 150 | 30 days supply | Qty: 30 | Fill #0

## 2019-05-23 NOTE — Progress Notes (Signed)
Office: [phone]  /  Fax: [phone] TeleHealth Visit:  Kathy Clark has verbally consented to this TeleHealth visit today. The patient is located at home, the provider is located at the [facility] office. The participants in this visit include the listed provider and patient and any and all parties involved. The visit was conducted today via WebEx.  HPI:   Chief Complaint: OBESITY Kathy Clark is here to discuss her progress with her obesity treatment plan. She is on the keep a food journal with 1400 calories and 90 grams of protein daily plan and she is following her eating plan approximately 90 % of the time. She states she is walking 30 minutes 5 times per week. Kathy Clark's most recent weight is 234 pounds (05/22/19). She reports that she has done a better job staying on the plan, but she still struggles to get all of her protein in. We were unable to weigh the patient today for this TeleHealth visit. She feels as if she has lost weight since her last visit. She has lost 7 lbs since starting treatment with [place].  Vitamin D deficiency Kathy Clark has a diagnosis of vitamin D deficiency. Kathy Clark is on vit D and she denies nausea, vomiting or muscle weakness.  Depression with emotional eating behaviors Kathy Clark is struggling with emotional eating and using food for comfort to the extent that it is negatively impacting her health. She often snacks when she is not hungry. Kathy Clark sometimes feels she is out of control and then feels guilty that she made poor food choices. She has been working on behavior modification techniques to help reduce her emotional eating and has been somewhat successful. Kathy Clark is on Wellbutrin currently. She shows no sign of suicidal or homicidal ideations.  ASSESSMENT AND PLAN:  Vitamin D deficiency - Plan: Vitamin D, Ergocalciferol, (DRISDOL) 1.25 MG (50000 UT) CAPS capsule  Other depression - Plan: buPROPion (WELLBUTRIN SR) 150 MG 12 hr tablet  Class 2 severe  obesity with serious comorbidity and body mass index (BMI) of 35.0 to 35.9 in adult, unspecified obesity type (HCC)  Other depression - with emotional eating - Plan: buPROPion (WELLBUTRIN SR) 150 MG 12 hr tablet  PLAN:  Vitamin D Deficiency Kathy Clark was informed that low vitamin D levels contributes to fatigue and are associated with obesity, breast, and colon cancer. Kathy Clark agrees to continue to take prescription Vit D @50 ,000 IU every week #4 with no refills and she will follow up for routine testing of vitamin D, at least 2-3 times per year. She was informed of the risk of over-replacement of vitamin D and agrees to not increase her dose unless she discusses this with [place] first. [name] agrees to follow up with our clinic in 3 weeks.  Depression with Emotional Eating Behaviors We discussed behavior modification techniques today to help Kathy Clark deal with her emotional eating and depression. Kathy Clark has agreed to continue Wellbutrin SR 150 mg daily #30 with no refills and follow up as directed.  Obesity Kathy Clark is currently in the action stage of change. As such, her goal is to continue with weight loss efforts She has agreed to keep a food journal with 1400 calories and 90 grams of protein daily Kathy Clark has been instructed to work up to a goal of 150 minutes of combined cardio and strengthening exercise per week for weight loss and overall health benefits. We discussed the following Behavioral Modification Strategies today: work on meal planning and easy cooking plans and holiday eating strategies  Kathy Clark has agreed to follow up with our clinic in 3 weeks. She was informed of the importance of frequent follow up visits to maximize her success with intensive lifestyle modifications for her multiple health conditions.  ALLERGIES: Allergies  Allergen Reactions   Succinylcholine Anaphylaxis   Dilaudid [Hydromorphone Hcl] Nausea And Vomiting    MEDICATIONS: Current Outpatient Medications on File Prior  to Visit  Medication Sig Dispense Refill   albuterol (PROVENTIL HFA;VENTOLIN HFA) 108 (90 Base) MCG/ACT inhaler Inhale 2 puffs into the lungs every 6 (six) hours as needed for wheezing or shortness of breath. 1 Inhaler 0   AMBULATORY NON FORMULARY MEDICATION Medication Name: Please decrease CPAP setting to 8 cm of water pressure and fax [place] a download after 5 days.  Is having a lot of difficulty with air leaks is working to try to reduce her pressure to see if she is still being adequately treated but with fewer leaks.FAx to [place] 1 Units 0   Calcium Carbonate-Vitamin D (CALTRATE 600+D PO) Take 1 tablet by mouth daily.     clonazePAM (KLONOPIN) 0.5 MG disintegrating tablet Take 1 tablet (0.5 mg total) by mouth daily as needed. 30 tablet 0   clotrimazole-betamethasone (LOTRISONE) cream Apply 1 application topically at bedtime as needed. 30 g 1   EPINEPHrine 0.3 mg/0.3 mL IJ SOAJ injection Inject 0.3 mLs (0.3 mg total) into the muscle as needed for anaphylaxis. 1 Device 2   estradiol (VIVELLE-DOT) 0.075 MG/24HR   3   gabapentin (NEURONTIN) 300 MG capsule 2 caps p.o. twice daily for a week then increase dose as needed up to a maximum of 3600 mg daily (Patient taking differently: Take 300 mg by mouth 2 (two) times daily. 1 capsule twice daily) 180 capsule 3   hydrochlorothiazide (MICROZIDE) 12.5 MG capsule TAKE 1 CAPSULE BY MOUTH DAILY. 90 capsule 3   hyoscyamine (LEVBID) 0.375 MG 12 hr tablet Take 1 tablet (0.375 mg total) by mouth 2 (two) times daily. 60 tablet 0   losartan (COZAAR) 25 MG tablet TAKE 1 TABLET BY MOUTH DAILY. 90 tablet 3   Melatonin 3 MG TABS Take by mouth as directed.     Multiple Vitamins-Minerals (MULTIVITAMIN ADULT PO) Take by mouth daily.     SUMAtriptan (IMITREX) 20 MG/ACT nasal spray PLACE 1 SPRAY INTO THE NOSE EVERY 2 HOURS AS NEEDED FOR. HEADACHE 6 Inhaler 2   Vilazodone HCl (VIIBRYD) 40 MG TABS Take 1 tablet (40 mg total) by mouth daily. 90 tablet 1     No current facility-administered medications on file prior to visit.     PAST MEDICAL HISTORY: Past Medical History:  Diagnosis Date   Anxiety    Back pain    Complication of anesthesia    allergy to Succinylcholine   Depression    Essential hypertension, benign    Gastritis    GERD (gastroesophageal reflux disease)    Hypertension    Joint pain    Lymphocytic colitis    microscopic   Migraines    Osteoarthritis    Sciatica    right leg   Swelling    feet, left foot    PAST SURGICAL HISTORY: Past Surgical History:  Procedure Laterality Date   ABDOMINAL HYSTERECTOMY  07-2005   [place] Bunionectomy Left 09/18/2016   Left Foot   BACK SURGERY     KNEE SURGERY  2006   left; multiple   microdiscetomy  2005   SHOULDER SURGERY  07-22-09   right   SKIN  CANCER EXCISION     eyelid   TOTAL KNEE ARTHROPLASTY  07/27/2011   Procedure: TOTAL KNEE ARTHROPLASTY;  Surgeon: Gearlean Alf;  Location: [facility];  Service: Orthopedics;  Laterality: Left;    SOCIAL HISTORY: Social History   Tobacco Use   Smoking status: Never Smoker   Smokeless tobacco: Never Used  Substance Use Topics   Alcohol use: Yes    Comment: occasionally   Drug use: No    FAMILY HISTORY: Family History  Problem Relation Age of Onset   Breast cancer Unknown    Tongue cancer Mother    Breast cancer Mother    Anxiety disorder Mother    Depression Mother    Heart disease Father    Hypertension Father    Hyperlipidemia Father    Stroke Paternal Grandmother    Colon cancer Neg Hx     ROS: Review of Systems  Constitutional: Positive for weight loss.  Gastrointestinal: Negative for nausea and vomiting.  Musculoskeletal:       Negative for muscle weakness  Psychiatric/Behavioral: Positive for depression. Negative for suicidal ideas.    PHYSICAL EXAM: Pt in no acute distress  RECENT LABS AND TESTS: BMET    Component Value Date/Time   NA 139 12/12/2018  0823   NA 142 08/18/2017 0940   K 4.5 12/12/2018 0823   CL 102 12/12/2018 0823   CO2 27 12/12/2018 0823   GLUCOSE 86 12/12/2018 0823   BUN 16 12/12/2018 0823   BUN 12 08/18/2017 0940   CREATININE 0.78 12/12/2018 0823   CALCIUM 9.1 12/12/2018 0823   GFRNONAA 84 12/12/2018 0823   GFRAA 97 12/12/2018 0823   Lab Results  Component Value Date   HGBA1C 5.1 02/14/2019   HGBA1C 5.1 12/01/2016   HGBA1C 4.9 08/03/2016   Lab Results  Component Value Date   INSULIN 5.4 12/01/2016   INSULIN 5.9 08/03/2016   CBC    Component Value Date/Time   WBC WILL FOLLOW 08/18/2017 0940   WBC 6.7 03/26/2016 1600   RBC WILL FOLLOW 08/18/2017 0940   RBC 4.68 03/26/2016 1600   HGB WILL FOLLOW 08/18/2017 0940   HCT WILL FOLLOW 08/18/2017 0940   PLT WILL FOLLOW 08/18/2017 0940   MCV WILL FOLLOW 08/18/2017 0940   MCH WILL FOLLOW 08/18/2017 0940   MCH 30.6 03/26/2016 1600   MCHC WILL FOLLOW 08/18/2017 0940   MCHC 33.6 03/26/2016 1600   RDW WILL FOLLOW 08/18/2017 0940   LYMPHSABS WILL FOLLOW 08/18/2017 0940   MONOABS 469 03/26/2016 1600   EOSABS WILL FOLLOW 08/18/2017 0940   BASOSABS WILL FOLLOW 08/18/2017 0940   Iron/TIBC/Ferritin/ %Sat    Component Value Date/Time   FERRITIN 142 12/25/2015 0912   Lipid Panel     Component Value Date/Time   CHOL 206 (A) 02/15/2019   CHOL 196 05/02/2018 0943   TRIG 133 02/15/2019   HDL 57 02/15/2019   HDL 61 05/02/2018 0943   CHOLHDL 3.6 02/14/2019 0817   VLDL 21 04/05/2015 0937   LDLCALC 124 02/15/2019   LDLCALC 124 (H) 02/14/2019 0817   Hepatic Function Panel     Component Value Date/Time   PROT 6.7 12/12/2018 0823   PROT 6.9 08/18/2017 0940   ALBUMIN 4.4 08/18/2017 0940   AST 17 12/12/2018 0823   ALT 20 12/12/2018 0823   ALKPHOS 47 08/18/2017 0940   BILITOT 0.7 12/12/2018 0823   BILITOT 0.9 08/18/2017 0940   BILIDIR 0.1 12/25/2015 0912   IBILI 0.5 12/25/2015 0912  Component Value Date/Time   TSH 1.470 08/03/2016 1458   TSH 2.09  03/26/2016 1600   TSH 3.10 12/25/2015 0912     Ref. Range 02/13/2019 09:15  Vitamin D, 25-Hydroxy Latest Ref Range: 30 - 100 ng/mL 29 (L)    I, Doreene Nest, am acting as transcriptionist for Abby Potash, PA-C I, Abby Potash, PA-C have reviewed above note and agree with its content

## 2019-05-31 ENCOUNTER — Telehealth: Payer: 59 | Admitting: Nurse Practitioner

## 2019-05-31 DIAGNOSIS — B3731 Acute candidiasis of vulva and vagina: Secondary | ICD-10-CM

## 2019-05-31 DIAGNOSIS — B373 Candidiasis of vulva and vagina: Secondary | ICD-10-CM | POA: Diagnosis not present

## 2019-05-31 MED ORDER — FLUCONAZOLE 150 MG PO TABS: 150.0000 mg | ORAL_TABLET | Freq: Once | ORAL | 0 refills | Status: AC

## 2019-05-31 MED FILL — FLUCONAZOLE 150 MG TABS: 150 | 1 days supply | Qty: 1 | Fill #0

## 2019-05-31 NOTE — Progress Notes (Signed)
We are sorry that you are not feeling well. Here is how we plan to help! Based on what you shared with me it looks like you: May have a yeast vaginosis  Vaginosis is an inflammation of the vagina that can result in discharge, itching and pain. The cause is usually a change in the normal balance of vaginal bacteria or an infection. Vaginosis can also result from reduced estrogen levels after menopause.  The most common causes of vaginosis are:   Bacterial vaginosis which results from an overgrowth of one on several organisms that are normally present in your vagina.   Yeast infections which are caused by a naturally occurring fungus called candida.   Vaginal atrophy (atrophic vaginosis) which results from the thinning of the vagina from reduced estrogen levels after menopause.   Trichomoniasis which is caused by a parasite and is commonly transmitted by sexual intercourse.  Factors that increase your risk of developing vaginosis include: [name] Medications, such as antibiotics and steroids . Uncontrolled diabetes . Use of hygiene products such as bubble bath, vaginal spray or vaginal deodorant . Douching . Wearing damp or tight-fitting clothing . Using an intrauterine device (IUD) for birth control . Hormonal changes, such as those associated with pregnancy, birth control pills or menopause . Sexual activity . Having a sexually transmitted infection  Your treatment plan is A single Diflucan (fluconazole) 150mg  tablet once.  I have electronically sent this prescription into the pharmacy that you have chosen.  Be sure to take all of the medication as directed. Stop taking any medication if you develop a rash, tongue swelling or shortness of breath. Mothers who are breast feeding should consider pumping and discarding their breast milk while on these antibiotics. However, there is no consensus that infant exposure at these doses would be harmful.  Remember that medication creams can weaken latex  condoms. . * you will have to get preventative care form your Primary Care provider.  HOME CARE:  Good hygiene may prevent some types of vaginosis from recurring and may relieve some symptoms:  . Avoid baths, hot tubs and whirlpool spas. Rinse soap from your outer genital area after a shower, and dry the area well to prevent irritation. Don't use scented or harsh soaps, such as those with deodorant or antibacterial action. [name] Avoid irritants. These include scented tampons and pads. . Wipe from front to back after using the toilet. Doing so avoids spreading fecal bacteria to your vagina.  Other things that may help prevent vaginosis include:  [name] Don't douche. Your vagina doesn't require cleansing other than normal bathing. Repetitive douching disrupts the normal organisms that reside in the vagina and can actually increase your risk of vaginal infection. Douching won't clear up a vaginal infection. . Use a latex condom. Both female and female latex condoms may help you avoid infections spread by sexual contact. . Wear cotton underwear. Also wear pantyhose with a cotton crotch. If you feel comfortable without it, skip wearing underwear to bed. Yeast thrives in [employer] Your symptoms should improve in the next day or two.  GET HELP RIGHT AWAY IF:  . You have pain in your lower abdomen ( pelvic area or over your ovaries) . You develop nausea or vomiting . You develop a fever . Your discharge changes or worsens . You have persistent pain with intercourse . You develop shortness of breath, a rapid pulse, or you faint.  These symptoms could be signs of problems or infections that need to be  evaluated by a medical provider now.  MAKE SURE YOU    Understand these instructions.  Will watch your condition.  Will get help right away if you are not doing well or get worse.  Your e-visit answers were reviewed by a board certified advanced clinical practitioner to complete your personal  care plan. Depending upon the condition, your plan could have included both over the counter or prescription medications. Please review your pharmacy choice to make sure that you have choses a pharmacy that is open for you to pick up any needed prescription, Your safety is important to [place]. If you have drug allergies check your prescription carefully.   You can use MyChart to ask questions about today's visit, request a non-urgent call back, or ask for a work or school excuse for 24 hours related to this e-Visit. If it has been greater than 24 hours you will need to follow up with your provider, or enter a new e-Visit to address those concerns. You will get a MyChart message within the next two days asking about your experience. I hope that your e-visit has been valuable and will speed your recovery.  5-10 minutes spent reviewing and documenting in chart.

## 2019-06-12 ENCOUNTER — Encounter (INDEPENDENT_AMBULATORY_CARE_PROVIDER_SITE_OTHER): Payer: Self-pay | Admitting: Physician Assistant

## 2019-06-12 ENCOUNTER — Telehealth (INDEPENDENT_AMBULATORY_CARE_PROVIDER_SITE_OTHER): Payer: 59 | Admitting: Physician Assistant

## 2019-06-12 ENCOUNTER — Other Ambulatory Visit: Payer: Self-pay

## 2019-06-12 DIAGNOSIS — E559 Vitamin D deficiency, unspecified: Secondary | ICD-10-CM | POA: Diagnosis not present

## 2019-06-12 DIAGNOSIS — E669 Obesity, unspecified: Secondary | ICD-10-CM

## 2019-06-12 DIAGNOSIS — F3289 Other specified depressive episodes: Secondary | ICD-10-CM

## 2019-06-12 DIAGNOSIS — Z6834 Body mass index (BMI) 34.0-34.9, adult: Secondary | ICD-10-CM | POA: Diagnosis not present

## 2019-06-12 MED ORDER — BUPROPION HCL ER (SR) 150 MG PO TB12: 150.0000 mg | ORAL_TABLET | Freq: Every day | ORAL | 0 refills | Status: DC

## 2019-06-12 MED ORDER — VITAMIN D (ERGOCALCIFEROL) 1.25 MG (50000 UNIT) PO CAPS: 50000.0000 [IU] | ORAL_CAPSULE | ORAL | 0 refills | Status: DC

## 2019-06-12 NOTE — Progress Notes (Signed)
Office: [phone]  /  Fax: [phone] TeleHealth Visit:  Kathy Clark has verbally consented to this TeleHealth visit today. The patient is located at home, the provider is located at the [employer] [facility] office. The participants in this visit include the listed provider and patient. The visit was conducted today via Webex.  HPI:   Chief Complaint: OBESITY Kathy Clark is here to discuss her progress with her obesity treatment plan. She is keeping a food journal with 1400 calories and 90 grams of protein and is following her eating plan approximately 60% of the time. She states she is walking 30 minutes 3 times per week. Kathy Clark's most recent weight was 239 lbs. She reports doing a great deal of stress and emotional eating recently. She is having trouble with portion control, especially with her snacks. We were unable to weigh the patient today for this TeleHealth visit. She feels as if she has gained weight since her last visit. She has lost 7 lbs since starting treatment with [place].  Vitamin D deficiency Kathy Clark has a diagnosis of Vitamin D deficiency. She is currently taking prescription Vit D and denies nausea, vomiting or muscle weakness.  Depression with emotional eating behaviors Kathy Clark is struggling with emotional eating and using food for comfort to the extent that it is negatively impacting her health. She often snacks when she is not hungry. Kathy Clark sometimes feels she is out of control and then feels guilty that she made poor food choices. She has been working on behavior modification techniques to help reduce her emotional eating and has been somewhat successful. Kathy Clark reports a great deal of stress related to her divorce. She is doing a lot of stress and emotional eating. Currently she is on Wellbutrin and Viibryd.  She shows no sign of suicidal or homicidal ideations.  Depression screen [facility] 2/9 01/17/2019 11/28/2018 05/30/2018 12/10/2017 11/02/2017  Decreased Interest 1 1 2 1 1     Down, Depressed, Hopeless 1 0 1 1 1   PHQ - 2 Score 2 1 3 2 2   Altered sleeping 1 1 3 2 3   Tired, decreased energy 1 1 3 2 1   Change in appetite 1 1 2 1 1   Feeling bad or failure about yourself  0 0 0 1 1  Trouble concentrating 1 0 - 1 -  Moving slowly or fidgety/restless 0 0 0 0 0  Suicidal thoughts 0 0 0 0 0  PHQ-9 Score 6 4 11 9 8   Difficult doing work/chores Somewhat difficult Somewhat difficult Somewhat difficult Somewhat difficult Somewhat difficult  Some recent data might be hidden   ASSESSMENT AND PLAN:  Vitamin D deficiency - Plan: Vitamin D, Ergocalciferol, (DRISDOL) 1.25 MG (50000 UT) CAPS capsule  Other depression - with emotional eating - Plan: buPROPion (WELLBUTRIN SR) 150 MG 12 hr tablet  Class 1 obesity with serious comorbidity and body mass index (BMI) of 34.0 to 34.9 in adult, unspecified obesity type  PLAN:  Vitamin D Deficiency Kathy Clark was informed that low Vitamin D levels contributes to fatigue and are associated with obesity, breast, and colon cancer. She agrees to continue to take prescription Vit D @ 50,000 IU every week #4 with 0 refills and will follow-up for routine testing of Vitamin D, at least 2-3 times per year. She was informed of the risk of over-replacement of Vitamin D and agrees to not increase her dose unless she discusses this with [place] first. Kathy Clark agrees to follow-up with our clinic in 2 weeks.  Depression  with Emotional Eating Behaviors We discussed behavior modification techniques today to help Kathy Clark deal with her emotional eating behaviors. Kathy Clark will increase Wellbutrin to 200 mg SR 1 PO daily #30 with 0 refills and agrees to follow-up with our clinic in 2 weeks. She will be referred to Kathy Clark, our bariatric psychologist, for evaluation.  Obesity Kathy Clark is currently in the action stage of change. As such, her goal is to continue with weight loss efforts. She has agreed to keep a food journal with 1400 calories and 90 grams of protein.   Kathy Clark has been instructed to work up to a goal of 150 minutes of combined cardio and strengthening exercise per week for weight loss and overall health benefits. We discussed the following Behavioral Modification Strategies today: decreasing simple carbohydrates and keeping healthy foods in the home.  Kathy Clark has agreed to follow-up with our clinic in 2 weeks. She was informed of the importance of frequent follow-up visits to maximize her success with intensive lifestyle modifications for her multiple health conditions.  ALLERGIES: Allergies  Allergen Reactions   Succinylcholine Anaphylaxis   Dilaudid [Hydromorphone Hcl] Nausea And Vomiting    MEDICATIONS: Current Outpatient Medications on File Prior to Visit  Medication Sig Dispense Refill   albuterol (PROVENTIL HFA;VENTOLIN HFA) 108 (90 Base) MCG/ACT inhaler Inhale 2 puffs into the lungs every 6 (six) hours as needed for wheezing or shortness of breath. 1 Inhaler 0   AMBULATORY NON FORMULARY MEDICATION Medication Name: Please decrease CPAP setting to 8 cm of water pressure and fax [place] a download after 5 days.  Is having a lot of difficulty with air leaks is working to try to reduce her pressure to see if she is still being adequately treated but with fewer leaks.FAx to [place] 1 Units 0   Calcium Carbonate-Vitamin D (CALTRATE 600+D PO) Take 1 tablet by mouth daily.     clonazePAM (KLONOPIN) 0.5 MG disintegrating tablet Take 1 tablet (0.5 mg total) by mouth daily as needed. 30 tablet 0   clotrimazole-betamethasone (LOTRISONE) cream Apply 1 application topically at bedtime as needed. 30 g 1   EPINEPHrine 0.3 mg/0.3 mL IJ SOAJ injection Inject 0.3 mLs (0.3 mg total) into the muscle as needed for anaphylaxis. 1 Device 2   estradiol (VIVELLE-DOT) 0.075 MG/24HR   3   gabapentin (NEURONTIN) 300 MG capsule 2 caps p.o. twice daily for a week then increase dose as needed up to a maximum of 3600 mg daily (Patient taking differently:  Take 300 mg by mouth 2 (two) times daily. 1 capsule twice daily) 180 capsule 3   hydrochlorothiazide (MICROZIDE) 12.5 MG capsule TAKE 1 CAPSULE BY MOUTH DAILY. 90 capsule 3   hyoscyamine (LEVBID) 0.375 MG 12 hr tablet Take 1 tablet (0.375 mg total) by mouth 2 (two) times daily. 60 tablet 0   losartan (COZAAR) 25 MG tablet TAKE 1 TABLET BY MOUTH DAILY. 90 tablet 3   Melatonin 3 MG TABS Take by mouth as directed.     Multiple Vitamins-Minerals (MULTIVITAMIN ADULT PO) Take by mouth daily.     SUMAtriptan (IMITREX) 20 MG/ACT nasal spray PLACE 1 SPRAY INTO THE NOSE EVERY 2 HOURS AS NEEDED FOR. HEADACHE 6 Inhaler 2   Vilazodone HCl (VIIBRYD) 40 MG TABS Take 1 tablet (40 mg total) by mouth daily. 90 tablet 1   No current facility-administered medications on file prior to visit.     PAST MEDICAL HISTORY: Past Medical History:  Diagnosis Date   Anxiety  Back pain    Complication of anesthesia    allergy to Succinylcholine   Depression    Essential hypertension, benign    Gastritis    GERD (gastroesophageal reflux disease)    Hypertension    Joint pain    Lymphocytic colitis    microscopic   Migraines    Osteoarthritis    Sciatica    right leg   Swelling    feet, left foot    PAST SURGICAL HISTORY: Past Surgical History:  Procedure Laterality Date   ABDOMINAL HYSTERECTOMY  07-2005   [place] Bunionectomy Left 09/18/2016   Left Foot   BACK SURGERY     KNEE SURGERY  2006   left; multiple   microdiscetomy  2005   SHOULDER SURGERY  07-22-09   right   SKIN CANCER EXCISION     eyelid   TOTAL KNEE ARTHROPLASTY  07/27/2011   Procedure: TOTAL KNEE ARTHROPLASTY;  Surgeon: Gearlean Alf;  Location: [facility];  Service: Orthopedics;  Laterality: Left;    SOCIAL HISTORY: Social History   Tobacco Use   Smoking status: Never Smoker   Smokeless tobacco: Never Used  Substance Use Topics   Alcohol use: Yes    Comment: occasionally   Drug use: No     FAMILY HISTORY: Family History  Problem Relation Age of Onset   Breast cancer Unknown    Tongue cancer Mother    Breast cancer Mother    Anxiety disorder Mother    Depression Mother    Heart disease Father    Hypertension Father    Hyperlipidemia Father    Stroke Paternal Grandmother    Colon cancer Neg Hx    ROS: Review of Systems  Gastrointestinal: Negative for nausea and vomiting.  Musculoskeletal:       Negative for muscle weakness.  Psychiatric/Behavioral: Positive for depression (emotional eating). Negative for suicidal ideas.       Negative for homicidal ideas.   PHYSICAL EXAM: Pt in no acute distress  RECENT LABS AND TESTS: BMET    Component Value Date/Time   NA 139 12/12/2018 0823   NA 142 08/18/2017 0940   K 4.5 12/12/2018 0823   CL 102 12/12/2018 0823   CO2 27 12/12/2018 0823   GLUCOSE 86 12/12/2018 0823   BUN 16 12/12/2018 0823   BUN 12 08/18/2017 0940   CREATININE 0.78 12/12/2018 0823   CALCIUM 9.1 12/12/2018 0823   GFRNONAA 84 12/12/2018 0823   GFRAA 97 12/12/2018 0823   Lab Results  Component Value Date   HGBA1C 5.1 02/14/2019   HGBA1C 5.1 12/01/2016   HGBA1C 4.9 08/03/2016   Lab Results  Component Value Date   INSULIN 5.4 12/01/2016   INSULIN 5.9 08/03/2016   CBC    Component Value Date/Time   WBC WILL FOLLOW 08/18/2017 0940   WBC 6.7 03/26/2016 1600   RBC WILL FOLLOW 08/18/2017 0940   RBC 4.68 03/26/2016 1600   HGB WILL FOLLOW 08/18/2017 0940   HCT WILL FOLLOW 08/18/2017 0940   PLT WILL FOLLOW 08/18/2017 0940   MCV WILL FOLLOW 08/18/2017 0940   MCH WILL FOLLOW 08/18/2017 0940   MCH 30.6 03/26/2016 1600   MCHC WILL FOLLOW 08/18/2017 0940   MCHC 33.6 03/26/2016 1600   RDW WILL FOLLOW 08/18/2017 0940   LYMPHSABS WILL FOLLOW 08/18/2017 0940   MONOABS 469 03/26/2016 1600   EOSABS WILL FOLLOW 08/18/2017 0940   BASOSABS WILL FOLLOW 08/18/2017 0940   Iron/TIBC/Ferritin/ %Sat    Component Value  Date/Time   FERRITIN  142 12/25/2015 0912   Lipid Panel     Component Value Date/Time   CHOL 206 (A) 02/15/2019   CHOL 196 05/02/2018 0943   TRIG 133 02/15/2019   HDL 57 02/15/2019   HDL 61 05/02/2018 0943   CHOLHDL 3.6 02/14/2019 0817   VLDL 21 04/05/2015 0937   LDLCALC 124 02/15/2019   LDLCALC 124 (H) 02/14/2019 0817   Hepatic Function Panel     Component Value Date/Time   PROT 6.7 12/12/2018 0823   PROT 6.9 08/18/2017 0940   ALBUMIN 4.4 08/18/2017 0940   AST 17 12/12/2018 0823   ALT 20 12/12/2018 0823   ALKPHOS 47 08/18/2017 0940   BILITOT 0.7 12/12/2018 0823   BILITOT 0.9 08/18/2017 0940   BILIDIR 0.1 12/25/2015 0912   IBILI 0.5 12/25/2015 0912      Component Value Date/Time   TSH 1.470 08/03/2016 1458   TSH 2.09 03/26/2016 1600   TSH 3.10 12/25/2015 0912   Results for HONESTII, RICHENS (MRN [id number]) as of 06/12/2019 15:43  Ref. Range 02/13/2019 09:15  Vitamin D, 25-Hydroxy Latest Ref Range: 30 - 100 ng/mL 29 (L)   I, Michaelene Song, am acting as Location manager for [employer], PA-C I, Abby Potash, PA-C have reviewed above note and agree with its content

## 2019-06-13 ENCOUNTER — Ambulatory Visit (INDEPENDENT_AMBULATORY_CARE_PROVIDER_SITE_OTHER): Payer: 59 | Admitting: Family Medicine

## 2019-06-13 ENCOUNTER — Other Ambulatory Visit: Payer: Self-pay

## 2019-06-13 ENCOUNTER — Encounter: Payer: Self-pay | Admitting: Family Medicine

## 2019-06-13 VITALS — BP 129/76 | HR 79 | Ht 67.0 in | Wt 243.0 lb

## 2019-06-13 DIAGNOSIS — G43901 Migraine, unspecified, not intractable, with status migrainosus: Secondary | ICD-10-CM

## 2019-06-13 DIAGNOSIS — M542 Cervicalgia: Secondary | ICD-10-CM

## 2019-06-13 MED ORDER — PROMETHAZINE HCL 25 MG/ML IJ SOLN
25.0000 mg | Freq: Once | INTRAMUSCULAR | Status: AC
  Administered 2019-06-13: 25 mg via INTRAMUSCULAR

## 2019-06-13 MED ORDER — PREDNISONE 10 MG PO TABS: ORAL_TABLET | ORAL | 0 refills | Status: DC

## 2019-06-13 MED ORDER — CYCLOBENZAPRINE HCL 10 MG PO TABS: 5.0000 mg | ORAL_TABLET | Freq: Every evening | ORAL | 0 refills | Status: DC | PRN

## 2019-06-13 MED ORDER — KETOROLAC TROMETHAMINE 60 MG/2ML IM SOLN
60.0000 mg | Freq: Once | INTRAMUSCULAR | Status: AC
  Administered 2019-06-13: 60 mg via INTRAMUSCULAR

## 2019-06-13 MED FILL — CYCLOBENZAPRINE HCL 10 MG T: 10 | 30 days supply | Qty: 30 | Fill #0

## 2019-06-13 MED FILL — VIT D2 1.25 MG (50,000 UNIT: 1.25 MG | 28 days supply | Qty: 4 | Fill #0

## 2019-06-13 MED FILL — predniSONE 10 MG TABS: 10 | 5 days supply | Qty: 21 | Fill #0

## 2019-06-13 NOTE — Progress Notes (Signed)
Acute Office Visit  Subjective:    Patient ID: Kathy Clark, female    DOB: [birth year], 58 y.o.   MRN: [id number]  Chief Complaint  Patient presents with  . Headache    HPI Patient is in today for headaches that started about 5 days ago. Took tylenol and helped some. Then tried Imitrex and that didn't work at all. She has also been having some lateral, posterior mid neck pain.  Has been using heat for that and has been helpful.  She was worried her BP was up but hasn't been able to check it.  The headaches are mostly on top of her head.  But when she bends over it becomes a pounding headache.  Same thing when she coughs.  She has not been sleeping well and was in court last week.  She is going through divorce with her husband and even know the outcome was favorable she wonders if maybe some of this could be stress related.  She has not had any nausea today with the headaches but says over the weekend she was experiencing some nausea.  No vomiting. No vision changes or sinus sxs.  No sore throat.  No fevers, chills, or sweats.  Past Medical History:  Diagnosis Date  . Anxiety   . Back pain   . 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 Depression   . Essential hypertension, benign   . Gastritis   . GERD (gastroesophageal reflux disease)   . Hypertension   . Joint pain   . Lymphocytic colitis    microscopic  . Migraines   . Osteoarthritis   . Sciatica    right leg  . Swelling    feet, left foot    Past Surgical History:  Procedure Laterality Date  . ABDOMINAL HYSTERECTOMY  07-2005  . [place] Bunionectomy Left 09/18/2016   Left Foot  . BACK SURGERY    . KNEE SURGERY  2006   left; multiple  . microdiscetomy  2005  . SHOULDER SURGERY  07-22-09   right  . SKIN CANCER EXCISION     eyelid  . TOTAL KNEE ARTHROPLASTY  07/27/2011   Procedure: TOTAL KNEE ARTHROPLASTY;  Surgeon: Gearlean Alf;  Location: [facility];  Service: Orthopedics;  Laterality: Left;     Family History  Problem Relation Age of Onset  . Breast cancer Unknown   . Tongue cancer Mother   . Breast cancer Mother   . Anxiety disorder Mother   . Depression Mother   . Heart disease Father   . Hypertension Father   . Hyperlipidemia Father   . Stroke Paternal Grandmother   . Colon cancer Neg Hx     Social History   Socioeconomic History  . Marital status: Divorced    Spouse name: Jenny Reichmann  . Number of children: 2  . Years of education: Not on file  . Highest education level: Not on file  Occupational History  . Occupation: Programmer, multimedia: [employer]  . Financial resource strain: Not on file  . Food insecurity    Worry: Not on file    Inability: Not on file  . Transportation needs    Medical: Not on file    Non-medical: Not on file  Tobacco Use  . Smoking status: Never Smoker  . Smokeless tobacco: Never Used  Substance and Sexual Activity  . Alcohol use: Yes    Comment: occasionally  . Drug use:  No  . 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  Lifestyle  . Physical activity    Days per week: Not on file    Minutes per session: Not on file  . Stress: Not on file  Relationships  . Social Herbalist on phone: Not on file    Gets together: Not on file    Attends religious service: Not on file    Active member of club or organization: Not on file    Attends meetings of clubs or organizations: Not on file    Relationship status: Not on file  . Intimate partner violence    Fear of current or ex partner: Not on file    Emotionally abused: Not on file    Physically abused: Not on file    Forced sexual activity: Not on file  Other Topics Concern  . Not on file  Social History Narrative  . Not on file    Outpatient Medications Prior to Visit  Medication Sig Dispense Refill  . AMBULATORY NON FORMULARY MEDICATION Medication Name: Please decrease CPAP setting to 8 cm of water  pressure and fax [place] a download after 5 days.  Is having a lot of difficulty with air leaks is working to try to reduce her pressure to see if she is still being adequately treated but with fewer leaks.FAx to [place] 1 Units 0  . buPROPion (WELLBUTRIN SR) 150 MG 12 hr tablet Take 1 tablet (150 mg total) by mouth daily. 30 tablet 0  . Calcium Carbonate-Vitamin D (CALTRATE 600+D PO) Take 1 tablet by mouth daily.    . clonazePAM (KLONOPIN) 0.5 MG disintegrating tablet Take 1 tablet (0.5 mg total) by mouth daily as needed. 30 tablet 0  . clotrimazole-betamethasone (LOTRISONE) cream Apply 1 application topically at bedtime as needed. 30 g 1  . EPINEPHrine 0.3 mg/0.3 mL IJ SOAJ injection Inject 0.3 mLs (0.3 mg total) into the muscle as needed for anaphylaxis. 1 Device 2  . estradiol (VIVELLE-DOT) 0.075 MG/24HR   3  . gabapentin (NEURONTIN) 300 MG capsule 2 caps p.o. twice daily for a week then increase dose as needed up to a maximum of 3600 mg daily (Patient taking differently: Take 300 mg by mouth 2 (two) times daily. 1 capsule twice daily) 180 capsule 3  . hydrochlorothiazide (MICROZIDE) 12.5 MG capsule TAKE 1 CAPSULE BY MOUTH DAILY. 90 capsule 3  . losartan (COZAAR) 25 MG tablet TAKE 1 TABLET BY MOUTH DAILY. 90 tablet 3  . Melatonin 3 MG TABS Take by mouth as directed.    . Multiple Vitamins-Minerals (MULTIVITAMIN ADULT PO) Take by mouth daily.    . SUMAtriptan (IMITREX) 20 MG/ACT nasal spray PLACE 1 SPRAY INTO THE NOSE EVERY 2 HOURS AS NEEDED FOR. HEADACHE 6 Inhaler 2  . Vilazodone HCl (VIIBRYD) 40 MG TABS Take 1 tablet (40 mg total) by mouth daily. 90 tablet 1  . Vitamin D, Ergocalciferol, (DRISDOL) 1.25 MG (50000 UT) CAPS capsule Take 1 capsule (50,000 Units total) by mouth every 7 (seven) days. 4 capsule 0  . albuterol (PROVENTIL HFA;VENTOLIN HFA) 108 (90 Base) MCG/ACT inhaler Inhale 2 puffs into the lungs every 6 (six) hours as needed for wheezing or shortness of breath. 1 Inhaler 0  .  hyoscyamine (LEVBID) 0.375 MG 12 hr tablet Take 1 tablet (0.375 mg total) by mouth 2 (two) times daily. 60 tablet 0   No facility-administered medications  prior to visit.     Allergies  Allergen Reactions  . Succinylcholine Anaphylaxis  . Dilaudid [Hydromorphone Hcl] Nausea And Vomiting    ROS     Objective:    Physical Exam  Constitutional: She is oriented to person, place, and time. She appears well-developed and well-nourished.  HENT:  Head: Normocephalic and atraumatic.  Right Ear: External ear normal.  Left Ear: External ear normal.  Nose: Nose normal.  Mouth/Throat: Oropharynx is clear and moist.  TMs and canals are clear.   Eyes: Pupils are equal, round, and reactive to light. Conjunctivae and EOM are normal.  Neck: Neck supple. No thyromegaly present.  Cardiovascular: Normal rate, regular rhythm and normal heart sounds.  Pulmonary/Chest: Effort normal and breath sounds normal. She has no wheezes.  Musculoskeletal:     Comments: NROM of cervical spine.  No palpable knot from the muscular tissue but she is tender on the left side of the paraspinous muscles.  Lymphadenopathy:    She has no cervical adenopathy.  Neurological: She is alert and oriented to person, place, and time.  Skin: Skin is warm and dry.  Psychiatric: She has a normal mood and affect. Her behavior is normal.    BP 129/76   Pulse 79   Ht 5\' 7"  (1.702 m)   Wt 243 lb (110.2 kg)   LMP  (LMP Unknown) Comment: hystrectomy  SpO2 100%   BMI 38.06 kg/m  Wt Readings from Last 3 Encounters:  06/13/19 243 lb (110.2 kg)  10/18/17 205 lb (93 kg)  10/05/17 206 lb (93.4 kg)    There are no preventive care reminders to display for this patient.  There are no preventive care reminders to display for this patient.   Lab Results  Component Value Date   TSH 1.470 08/03/2016   Lab Results  Component Value Date   WBC WILL FOLLOW 08/18/2017   HGB WILL FOLLOW 08/18/2017   HCT WILL FOLLOW 08/18/2017    MCV WILL FOLLOW 08/18/2017   PLT WILL FOLLOW 08/18/2017   Lab Results  Component Value Date   NA 139 12/12/2018   K 4.5 12/12/2018   CO2 27 12/12/2018   GLUCOSE 86 12/12/2018   BUN 16 12/12/2018   CREATININE 0.78 12/12/2018   BILITOT 0.7 12/12/2018   ALKPHOS 47 08/18/2017   AST 17 12/12/2018   ALT 20 12/12/2018   PROT 6.7 12/12/2018   ALBUMIN 4.4 08/18/2017   CALCIUM 9.1 12/12/2018   Lab Results  Component Value Date   CHOL 206 (A) 02/15/2019   Lab Results  Component Value Date   HDL 57 02/15/2019   Lab Results  Component Value Date   LDLCALC 124 02/15/2019   Lab Results  Component Value Date   TRIG 133 02/15/2019   Lab Results  Component Value Date   CHOLHDL 3.6 02/14/2019   Lab Results  Component Value Date   HGBA1C 5.1 02/14/2019       Assessment & Plan:   Problem List Items Addressed This Visit    None    Visit Diagnoses    Cervical pain    -  Primary   Relevant Medications   cyclobenzaprine (FLEXERIL) 10 MG tablet   Status migrainosus       Relevant Medications   predniSONE (DELTASONE) 10 MG tablet   cyclobenzaprine (FLEXERIL) 10 MG tablet     Status migrainous-we will go ahead and treat with Toradol and Phenergan injection here in the office.  [place]  [place] from [place].  So she should be safe to drive home.  Encouraged her to just rest and relax today.  Also gave her a prednisone taper over the next 5 days to start tomorrow morning if she is not getting relief after the injection today.  We also discussed that I think her neck pain include and her stress levels are likely contributing factors.  Plus there is been a big cold front moving into the area which has been triggering migraines  Cervical pain-we discussed doing some stretches.  Handout provided.  Also gave muscle relaxer to try taking in the evenings to see if this helps as well.  Also encouraged her to look at the ergonomics of her workstation as she is currently working  from home and make sure that it is putting her in a good position in regards to her neck and her posture.  Could consider formal PT if she is not improving.   Meds ordered this encounter  Medications  . predniSONE (DELTASONE) 10 MG tablet    Sig: 8 tabs po Day 1, 6 tabs po Day 2, 4 tabs Day 3, 2 tabs Day 2, 1 tab Day 5    Dispense:  21 tablet    Refill:  0  . cyclobenzaprine (FLEXERIL) 10 MG tablet    Sig: Take 0.5-1 tablets (5-10 mg total) by mouth at bedtime as needed for muscle spasms.    Dispense:  30 tablet    Refill:  0     Beatrice Lecher, MD

## 2019-06-13 NOTE — Addendum Note (Signed)
Addended by: Teddy Spike on: 06/13/2019 12:15 PM   Modules accepted: Orders

## 2019-06-15 MED FILL — BUPROPION HCL ER (SR) 150 M: 150 | 30 days supply | Qty: 30 | Fill #0

## 2019-06-19 ENCOUNTER — Encounter: Payer: Self-pay | Admitting: Family Medicine

## 2019-06-19 ENCOUNTER — Ambulatory Visit (INDEPENDENT_AMBULATORY_CARE_PROVIDER_SITE_OTHER): Payer: 59 | Admitting: Family Medicine

## 2019-06-19 DIAGNOSIS — Z20828 Contact with and (suspected) exposure to other viral communicable diseases: Secondary | ICD-10-CM | POA: Diagnosis not present

## 2019-06-19 DIAGNOSIS — Z20822 Contact with and (suspected) exposure to covid-19: Secondary | ICD-10-CM

## 2019-06-19 DIAGNOSIS — J321 Chronic frontal sinusitis: Secondary | ICD-10-CM

## 2019-06-19 DIAGNOSIS — R509 Fever, unspecified: Secondary | ICD-10-CM

## 2019-06-19 MED ORDER — AMOXICILLIN-POT CLAVULANATE 875-125 MG PO TABS: 1.0000 | ORAL_TABLET | Freq: Two times a day (BID) | ORAL | 0 refills | Status: DC

## 2019-06-19 NOTE — Progress Notes (Signed)
Pt reports that she has felt really tired and has been experiencing fevers,sweats and chills. Her highest temperature was 103.0 around 2AM she took IBU for the fever.   She c/o pressure in her forehead and maxillary sinuses. Hurts to open her eyes. Denies any congestion. She does have a slight cough.   She has been taking Minocycline 100 mg BID that is for a rash on her face that was given by Dermatology and wanted to know if this would possibly help with her sinus issues. She did not start the Prednisone that was given at her OV on 06/13/2019.Maryruth Eve, Lahoma Crocker, CMA

## 2019-06-19 NOTE — Progress Notes (Signed)
Virtual Visit via Telephone Note  I connected with Kathy Clark on 06/19/19 at 10:30 AM EST by telephone and verified that I am speaking with the correct person using two identifiers.   I discussed the limitations, risks, security and privacy concerns of performing an evaluation and management service by telephone and the availability of in person appointments. I also discussed with the patient that there may be a patient responsible charge related to this service. The patient expressed understanding and agreed to proceed.   Subjective:    CC: fever  HPI: Kathy Clark was seen on 06/13/2019 for migraine and neck pain.  But unfortunately over the weekend about 3 days ago she started developing a fever.  Her highest fever was 103.0.  She has been taking Tylenol and ibuprofen.  She last took medication at 2 AM and temperature this morning is 97.8.  She says it hurts to open her eyes.  She still denies any congestion.  She has a lot of pressure in her forehead and maxillary sinuses.  She does have a mild intermittent cough.  Just feels like she is freezing and just really tired.  In regards to her headache the injection that she had on the first did take the edge off.  But she actually never started taking the prednisone because sometimes it causes her to not feel well so she had actually held off.  She says her neck pain has moved is a little bit lower than it was previously.  She just feels really tired.   Past medical history, Surgical history, Family history not pertinant except as noted below, Social history, Allergies, and medications have been entered into the medical record, reviewed, and corrections made.   Review of Systems: No fevers, chills, night sweats, weight loss, chest pain, or shortness of breath.   Objective:    General: Speaking clearly in complete sentences without any shortness of breath.  Alert and oriented x3.  Normal judgment. No apparent acute distress.    Impression and  Recommendations:    Fever with headache and sinus pain-discussed getting tested for Covid.  Also consider sinusitis.  She has been having the headache for over a week now so consider that this could be advancing sinusitis so we will treat with Augmentin.  Hold off on taking the prednisone since she never started it.  Drive by swab performed.     I discussed the assessment and treatment plan with the patient. The patient was provided an opportunity to ask questions and all were answered. The patient agreed with the plan and demonstrated an understanding of the instructions.   The patient was advised to call back or seek an in-person evaluation if the symptoms worsen or if the condition fails to improve as anticipated.  I provided 12 minutes of non-face-to-face time during this encounter.   Beatrice Lecher, MD

## 2019-06-20 DIAGNOSIS — R509 Fever, unspecified: Secondary | ICD-10-CM | POA: Diagnosis not present

## 2019-06-21 MED FILL — MINOCYCLINE 50 MG CAPSULE: 50 | 30 days supply | Qty: 60 | Fill #1

## 2019-06-21 MED FILL — GABAPENTIN 300 MG CAPSULE: 300 | 90 days supply | Qty: 180 | Fill #0

## 2019-06-22 LAB — NOVEL CORONAVIRUS, NAA: SARS-CoV-2, NAA: NOT DETECTED

## 2019-06-22 LAB — SPECIMEN STATUS REPORT

## 2019-06-24 ENCOUNTER — Telehealth: Payer: 59 | Admitting: Nurse Practitioner

## 2019-06-24 DIAGNOSIS — B373 Candidiasis of vulva and vagina: Secondary | ICD-10-CM

## 2019-06-24 DIAGNOSIS — B3731 Acute candidiasis of vulva and vagina: Secondary | ICD-10-CM

## 2019-06-24 MED ORDER — FLUCONAZOLE 150 MG PO TABS: 150.0000 mg | ORAL_TABLET | Freq: Once | ORAL | 0 refills | Status: AC

## 2019-06-24 NOTE — Progress Notes (Signed)

## 2019-06-26 ENCOUNTER — Other Ambulatory Visit: Payer: Self-pay

## 2019-06-26 ENCOUNTER — Encounter (INDEPENDENT_AMBULATORY_CARE_PROVIDER_SITE_OTHER): Payer: Self-pay | Admitting: Physician Assistant

## 2019-06-26 ENCOUNTER — Telehealth (INDEPENDENT_AMBULATORY_CARE_PROVIDER_SITE_OTHER): Payer: 59 | Admitting: Physician Assistant

## 2019-06-26 DIAGNOSIS — F3289 Other specified depressive episodes: Secondary | ICD-10-CM

## 2019-06-26 DIAGNOSIS — E7849 Other hyperlipidemia: Secondary | ICD-10-CM | POA: Diagnosis not present

## 2019-06-26 DIAGNOSIS — Z6838 Body mass index (BMI) 38.0-38.9, adult: Secondary | ICD-10-CM | POA: Diagnosis not present

## 2019-06-28 NOTE — Progress Notes (Signed)
Office: [phone]  /  Fax: [phone] TeleHealth Visit:  Kathy Clark has verbally consented to this TeleHealth visit today. The patient is located at home, the provider is located at the [facility] office. The participants in this visit include the listed provider and patient and any and all parties involved. The visit was conducted today via WebEx.  HPI:  Chief Complaint: OBESITY Kathy Clark is here to discuss her progress with her obesity treatment plan. She is on the keep a food journal with 1400 calories and 90 grams of protein daily plan and states she is following her eating plan approximately 75 % of the time. She states she is walking 30 minutes 3 times per week.  Kathy Clark reports that she had a sinus infection over the last couple of weeks and therefore had a few days where her appetite decreased. She had a negative COVID test. Other than that, she began journaling consistently and she met her calorie and protein goals.  Hyperlipidemia [name] has hyperlipidemia and she is not on medications. She has no chest pain.  Depression with emotional eating behaviors Kathy Clark is struggling with emotional eating. She is on Wellbutrin and she has no suicidal or homicidal ideations. [name] reports that her cravings have improved and she did not get the 200 mg dose, as it was not sent to the pharmacy.     ASSESSMENT AND PLAN:  Other hyperlipidemia  Other depression, with emotional eating  Class 2 severe obesity with serious comorbidity and body mass index (BMI) of 38.0 to 38.9 in adult, unspecified obesity type ([place])  PLAN:  Hyperlipidemia Intensive lifestyle modifications as the first line treatment for hyperlipidemia. We discussed many lifestyle modifications today and Lottie will continue to work on diet, exercise and weight loss efforts.  Emotional Eating Behaviors (other depression) Behavior modification techniques were discussed today to help Kathy Clark deal with her  emotional/non-hunger eating behaviors. Kathy Clark will decrease the dose of Wellbutrin to 150 mg (no refill needed) and follow up as directed. We will continue to follow and monitor her progress.  Obesity Kathy Clark is currently in the action stage of change. As such, her goal is to continue with weight loss efforts She has agreed to keep a food journal with 1400 calories and 90 grams of protein daily Kathy Clark has been instructed to work up to a goal of 150 minutes of combined cardio and strengthening exercise per week for weight loss and overall health benefits. We discussed the following Behavioral Modification Strategies today: work on meal planning and easy cooking plans and ways to avoid boredom eating  Kathy Clark has agreed to follow up with our clinic in 3 weeks. She was informed of the importance of frequent follow up visits to maximize her success with intensive lifestyle modifications for her multiple health conditions.  I spent > than 50% of the 30 minute visit on counseling as documented in the note.    ALLERGIES: Allergies  Allergen Reactions  . Succinylcholine Anaphylaxis  . Dilaudid [Hydromorphone Hcl] Nausea And Vomiting    MEDICATIONS: Current Outpatient Medications on File Prior to Visit  Medication Sig Dispense Refill  . AMBULATORY NON FORMULARY MEDICATION Medication Name: Please decrease CPAP setting to 8 cm of water pressure and fax [place] a download after 5 days.  Is having a lot of difficulty with air leaks is working to try to reduce her pressure to see if she is still being adequately treated but with fewer leaks.FAx to [facility] 1 Units 0  .  amoxicillin-clavulanate (AUGMENTIN) 875-125 MG tablet Take 1 tablet by mouth 2 (two) times daily. 20 tablet 0  . AMZEEQ 4 % FOAM     . buPROPion (WELLBUTRIN SR) 150 MG 12 hr tablet Take 1 tablet (150 mg total) by mouth daily. 30 tablet 0  . Calcium Carbonate-Vitamin D (CALTRATE 600+D PO) Take 1 tablet by mouth daily.    . clonazePAM  (KLONOPIN) 0.5 MG disintegrating tablet Take 1 tablet (0.5 mg total) by mouth daily as needed. 30 tablet 0  . clotrimazole-betamethasone (LOTRISONE) cream Apply 1 application topically at bedtime as needed. 30 g 1  . cyclobenzaprine (FLEXERIL) 10 MG tablet Take 0.5-1 tablets (5-10 mg total) by mouth at bedtime as needed for muscle spasms. 30 tablet 0  . EPINEPHrine 0.3 mg/0.3 mL IJ SOAJ injection Inject 0.3 mLs (0.3 mg total) into the muscle as needed for anaphylaxis. 1 Device 2  . estradiol (VIVELLE-DOT) 0.075 MG/24HR   3  . gabapentin (NEURONTIN) 300 MG capsule 2 caps p.o. twice daily for a week then increase dose as needed up to a maximum of 3600 mg daily (Patient taking differently: Take 300 mg by mouth 2 (two) times daily. 1 capsule twice daily) 180 capsule 3  . hydrochlorothiazide (MICROZIDE) 12.5 MG capsule TAKE 1 CAPSULE BY MOUTH DAILY. 90 capsule 3  . losartan (COZAAR) 25 MG tablet TAKE 1 TABLET BY MOUTH DAILY. 90 tablet 3  . Melatonin 3 MG TABS Take by mouth as directed.    . Multiple Vitamins-Minerals (MULTIVITAMIN ADULT PO) Take by mouth daily.    . predniSONE (DELTASONE) 10 MG tablet 8 tabs po Day 1, 6 tabs po Day 2, 4 tabs Day 3, 2 tabs Day 2, 1 tab Day 5 21 tablet 0  . SUMAtriptan (IMITREX) 20 MG/ACT nasal spray PLACE 1 SPRAY INTO THE NOSE EVERY 2 HOURS AS NEEDED FOR. HEADACHE 6 Inhaler 2  . Vilazodone HCl (VIIBRYD) 40 MG TABS Take 1 tablet (40 mg total) by mouth daily. 90 tablet 1  . Vitamin D, Ergocalciferol, (DRISDOL) 1.25 MG (50000 UT) CAPS capsule Take 1 capsule (50,000 Units total) by mouth every 7 (seven) days. 4 capsule 0   No current facility-administered medications on file prior to visit.    PAST MEDICAL HISTORY: Past Medical History:  Diagnosis Date  . Anxiety   . Back pain   . 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 Depression   . Essential hypertension, benign   . Gastritis   . GERD (gastroesophageal reflux disease)   . Hypertension   .  Joint pain   . Lymphocytic colitis    microscopic  . Migraines   . Osteoarthritis   . Sciatica    right leg  . Swelling    feet, left foot    PAST SURGICAL HISTORY: Past Surgical History:  Procedure Laterality Date  . ABDOMINAL HYSTERECTOMY  07-2005  . [place] Bunionectomy Left 09/18/2016   Left Foot  . BACK SURGERY    . KNEE SURGERY  2006   left; multiple  . microdiscetomy  2005  . SHOULDER SURGERY  07-22-09   right  . SKIN CANCER EXCISION     eyelid  . TOTAL KNEE ARTHROPLASTY  07/27/2011   Procedure: TOTAL KNEE ARTHROPLASTY;  Surgeon: Gearlean Alf;  Location: [facility];  Service: Orthopedics;  Laterality: Left;    SOCIAL HISTORY: Social History   Tobacco Use  . Smoking status: Never Smoker  . Smokeless tobacco: Never Used  Substance Use  Topics  . Alcohol use: Yes    Comment: occasionally  . Drug use: No    FAMILY HISTORY: Family History  Problem Relation Age of Onset  . Breast cancer Unknown   . Tongue cancer Mother   . Breast cancer Mother   . Anxiety disorder Mother   . Depression Mother   . Heart disease Father   . Hypertension Father   . Hyperlipidemia Father   . Stroke Paternal Grandmother   . Colon cancer Neg Hx     ROS: Review of Systems  Constitutional: Positive for weight loss.  Cardiovascular: Negative for chest pain.  Endo/Heme/Allergies:       Negative for cravings  Psychiatric/Behavioral: Positive for depression. Negative for suicidal ideas.    PHYSICAL EXAM: There were no vitals taken for this visit. There is no height or weight on file to calculate BMI. Physical Exam Vitals reviewed.  Constitutional:      General: She is not in acute distress.    Appearance: Normal appearance. She is well-developed. She is obese.  Cardiovascular:     Rate and Rhythm: Normal rate.  Pulmonary:     Effort: Pulmonary effort is normal.  Musculoskeletal:        General: Normal range of motion.  Skin:    General: Skin is warm and dry.    Neurological:     Mental Status: She is alert and oriented to person, place, and time.  Psychiatric:        Mood and Affect: Mood normal.        Behavior: Behavior normal.        Thought Content: Thought content does not include homicidal or suicidal ideation.     RECENT LABS AND TESTS: BMET    Component Value Date/Time   NA 139 12/12/2018 0823   NA 142 08/18/2017 0940   K 4.5 12/12/2018 0823   CL 102 12/12/2018 0823   CO2 27 12/12/2018 0823   GLUCOSE 86 12/12/2018 0823   BUN 16 12/12/2018 0823   BUN 12 08/18/2017 0940   CREATININE 0.78 12/12/2018 0823   CALCIUM 9.1 12/12/2018 0823   GFRNONAA 84 12/12/2018 0823   GFRAA 97 12/12/2018 0823   Lab Results  Component Value Date   HGBA1C 5.1 02/14/2019   HGBA1C 5.1 12/01/2016   HGBA1C 4.9 08/03/2016   Lab Results  Component Value Date   INSULIN 5.4 12/01/2016   INSULIN 5.9 08/03/2016   CBC    Component Value Date/Time   WBC WILL FOLLOW 08/18/2017 0940   WBC 6.7 03/26/2016 1600   RBC WILL FOLLOW 08/18/2017 0940   RBC 4.68 03/26/2016 1600   HGB WILL FOLLOW 08/18/2017 0940   HCT WILL FOLLOW 08/18/2017 0940   PLT WILL FOLLOW 08/18/2017 0940   MCV WILL FOLLOW 08/18/2017 0940   MCH WILL FOLLOW 08/18/2017 0940   MCH 30.6 03/26/2016 1600   MCHC WILL FOLLOW 08/18/2017 0940   MCHC 33.6 03/26/2016 1600   RDW WILL FOLLOW 08/18/2017 0940   LYMPHSABS WILL FOLLOW 08/18/2017 0940   MONOABS 469 03/26/2016 1600   EOSABS WILL FOLLOW 08/18/2017 0940   BASOSABS WILL FOLLOW 08/18/2017 0940   Iron/TIBC/Ferritin/ %Sat    Component Value Date/Time   FERRITIN 142 12/25/2015 0912   Lipid Panel     Component Value Date/Time   CHOL 206 (A) 02/15/2019 0000   CHOL 196 05/02/2018 0943   TRIG 133 02/15/2019 0000   HDL 57 02/15/2019 0000   HDL 61 05/02/2018 0943  CHOLHDL 3.6 02/14/2019 0817   VLDL 21 04/05/2015 0937   LDLCALC 124 02/15/2019 0000   LDLCALC 124 (H) 02/14/2019 0817   Hepatic Function Panel     Component Value  Date/Time   PROT 6.7 12/12/2018 0823   PROT 6.9 08/18/2017 0940   ALBUMIN 4.4 08/18/2017 0940   AST 17 12/12/2018 0823   ALT 20 12/12/2018 0823   ALKPHOS 47 08/18/2017 0940   BILITOT 0.7 12/12/2018 0823   BILITOT 0.9 08/18/2017 0940   BILIDIR 0.1 12/25/2015 0912   IBILI 0.5 12/25/2015 0912      Component Value Date/Time   TSH 1.470 08/03/2016 1458   TSH 2.09 03/26/2016 1600   TSH 3.10 12/25/2015 0912     Ref. Range 02/13/2019 09:15  Vitamin D, 25-Hydroxy Latest Ref Range: 30 - 100 ng/mL 29 (L)    I, Doreene Nest, am acting as transcriptionist for Abby Potash, PA-C I, Abby Potash, PA-C have reviewed above note and agree with its content

## 2019-07-10 MED FILL — VIIBRYD 40 MG TABLET: 40 | 90 days supply | Qty: 90 | Fill #1

## 2019-07-17 DIAGNOSIS — Z03818 Encounter for observation for suspected exposure to other biological agents ruled out: Secondary | ICD-10-CM | POA: Diagnosis not present

## 2019-07-18 ENCOUNTER — Encounter (INDEPENDENT_AMBULATORY_CARE_PROVIDER_SITE_OTHER): Payer: Self-pay | Admitting: Physician Assistant

## 2019-07-18 ENCOUNTER — Telehealth (INDEPENDENT_AMBULATORY_CARE_PROVIDER_SITE_OTHER): Payer: 59 | Admitting: Physician Assistant

## 2019-07-18 ENCOUNTER — Other Ambulatory Visit: Payer: Self-pay

## 2019-07-18 DIAGNOSIS — E669 Obesity, unspecified: Secondary | ICD-10-CM | POA: Diagnosis not present

## 2019-07-18 DIAGNOSIS — E559 Vitamin D deficiency, unspecified: Secondary | ICD-10-CM | POA: Diagnosis not present

## 2019-07-18 DIAGNOSIS — F3289 Other specified depressive episodes: Secondary | ICD-10-CM

## 2019-07-18 DIAGNOSIS — Z6834 Body mass index (BMI) 34.0-34.9, adult: Secondary | ICD-10-CM | POA: Diagnosis not present

## 2019-07-18 MED ORDER — VITAMIN D (ERGOCALCIFEROL) 1.25 MG (50000 UNIT) PO CAPS: 50000.0000 [IU] | ORAL_CAPSULE | ORAL | 0 refills | Status: DC

## 2019-07-18 MED ORDER — BUPROPION HCL ER (SR) 150 MG PO TB12: 150.0000 mg | ORAL_TABLET | Freq: Every day | ORAL | 0 refills | Status: DC

## 2019-07-18 MED FILL — VIT D2 1.25 MG (50,000 UNIT: 1.25 MG | 28 days supply | Qty: 4 | Fill #0

## 2019-07-18 MED FILL — BUPROPION HCL ER (SR) 150 M: 150 | 30 days supply | Qty: 30 | Fill #0

## 2019-07-20 NOTE — Progress Notes (Signed)
TeleHealth Visit:  Due to the COVID-19 pandemic, this visit was completed with telemedicine (audio/video) technology to reduce patient and provider exposure as well as to preserve personal protective equipment.   Kathy Clark has verbally consented to this TeleHealth visit. The patient is located at home, the provider is located at the [employer] [facility] office. The participants in this visit include the listed provider and patient and any and all parties involved. The visit was conducted today via WebEx.  Chief Complaint: Kathy Clark is here to discuss her progress with her obesity treatment plan along with follow-up of her obesity related diagnoses. Kathy Clark is on the keeping a food journal of 1400 calories and 90 grams of protein daily plan and states she is following her eating plan approximately 50% of the time. Kathy Clark states she is walking 30 minutes 2 times per week.  Today's visit was #: 39 Starting weight: 237 lbs Starting date: 08/03/2016  Interim History: Kathy Clark's most recent weight loss is 237 pounds (07/18/19). She states that she got off track with the holidays, eating sweet treats. She is back to journaling and she states that she is going to get rid of any leftover treats in the house.  Subjective:   Vitamin D deficiency Kathy Clark is on vitamin D and she has no nausea, vomiting or muscle weakness.  Other Depression with emotional eating Kathy Clark is on Bupropion. She has no suicidal or homicidal ideas. She is not having issues with cravings.  Assessment/Plan:   Vitamin D deficiency Low Vitamin D level contributes to fatigue and are associated with obesity, breast, and colon cancer. Kathy Clark agrees to continue to take prescription Vitamin D @50 ,000 IU every week #4 with no refills and she will follow-up for routine testing of vitamin D, at least 2-3 times per year to avoid over-replacement.  Other Depression with emotional eating Kathy Clark agrees to continue  Bupropion 150 mg daily with no refills.  Kathy Clark is currently in the action stage of change. As such, her goal is to continue with weight loss efforts. She has agreed to keeping a food journal and adhering to recommended goals of 1400 calories and 90 grams of protein daily.   We discussed the following exercise goals today: For substantial health benefits, adults should do at least 150 minutes (2 hours and 30 minutes) a week of moderate-intensity, or 75 minutes (1 hour and 15 minutes) a week of vigorous-intensity aerobic physical activity, or an equivalent combination of moderate- and vigorous-intensity aerobic activity. Aerobic activity should be performed in episodes of at least 10 minutes, and preferably, it should be spread throughout the week. Adults should also include muscle-strengthening activities that involve all major muscle groups on 2 or more days a week.  We discussed the following behavioral modification strategies today: meal planning and cooking strategies and keeping healthy foods in the home.  Kathy Clark has agreed to follow-up with our clinic in 2 weeks. She was informed of the importance of frequent follow-up visits to maximize her success with intensive lifestyle modifications for her multiple health conditions.  Objective:   VITALS: Per patient if applicable, see vitals. GENERAL: Alert and in no acute distress. CARDIOPULMONARY: No increased WOB. Speaking in clear sentences.  PSYCH: Pleasant and cooperative. Speech normal rate and rhythm. Affect is appropriate. Insight and judgement are appropriate. Attention is focused, linear, and appropriate.  NEURO: Oriented as arrived to appointment on time with no prompting.   Lab Results  Component Value Date  CREATININE 0.78 12/12/2018   BUN 16 12/12/2018   NA 139 12/12/2018   K 4.5 12/12/2018   CL 102 12/12/2018   CO2 27 12/12/2018   Lab Results  Component Value Date   ALT 20 12/12/2018   AST 17 12/12/2018   ALKPHOS  47 08/18/2017   BILITOT 0.7 12/12/2018   Lab Results  Component Value Date   HGBA1C 5.1 02/14/2019   HGBA1C 5.1 12/01/2016   HGBA1C 4.9 08/03/2016   Lab Results  Component Value Date   INSULIN 5.4 12/01/2016   INSULIN 5.9 08/03/2016   Lab Results  Component Value Date   TSH 1.470 08/03/2016   Lab Results  Component Value Date   CHOL 206 (A) 02/15/2019   HDL 57 02/15/2019   LDLCALC 124 02/15/2019   TRIG 133 02/15/2019   CHOLHDL 3.6 02/14/2019   Lab Results  Component Value Date   WBC WILL FOLLOW 08/18/2017   HGB WILL FOLLOW 08/18/2017   HCT WILL FOLLOW 08/18/2017   MCV WILL FOLLOW 08/18/2017   PLT WILL FOLLOW 08/18/2017   Lab Results  Component Value Date   FERRITIN 142 12/25/2015   Attestation Statements:   Reviewed by clinician on day of visit: allergies, medications, problem list, medical history, surgical history, family history, social history and previous encounter notes.   [name], am acting as Location manager for [employer], PA-C.  I have reviewed the above documentation for accuracy and completeness, and I agree with the above. Abby Potash, PA-C

## 2019-07-31 ENCOUNTER — Telehealth (INDEPENDENT_AMBULATORY_CARE_PROVIDER_SITE_OTHER): Payer: 59 | Admitting: Physician Assistant

## 2019-07-31 ENCOUNTER — Other Ambulatory Visit: Payer: Self-pay

## 2019-07-31 ENCOUNTER — Encounter (INDEPENDENT_AMBULATORY_CARE_PROVIDER_SITE_OTHER): Payer: Self-pay | Admitting: Physician Assistant

## 2019-07-31 DIAGNOSIS — E7849 Other hyperlipidemia: Secondary | ICD-10-CM

## 2019-07-31 DIAGNOSIS — Z6838 Body mass index (BMI) 38.0-38.9, adult: Secondary | ICD-10-CM

## 2019-07-31 MED FILL — DOTTI 0.075 MG/24HR PTTW: 0.075 | 56 days supply | Qty: 16 | Fill #2

## 2019-08-01 NOTE — Progress Notes (Signed)
TeleHealth Visit:  Due to the COVID-19 pandemic, this visit was completed with telemedicine (audio/video) technology to reduce patient and provider exposure as well as to preserve personal protective equipment.   Kathy Clark has verbally consented to this TeleHealth visit. The patient is located at home, the provider is located at the [employer] [facility] office. The participants in this visit include the listed provider and patient and any and all parties involved. The visit was conducted today via WebEx.  Chief Complaint: OBESITY Kathy Clark is here to discuss her progress with her obesity treatment plan along with follow-up of her obesity related diagnoses. Kathy Clark is on keeping a food journal of 1400 calories and 90 grams of protein daily and states she is following her eating plan approximately 75% of the time. Kathy Clark states she is exercising 0 minutes 0 times per week.  Today's visit was #: 78 Starting weight: 237 lbs Starting date: 08/03/2016  Interim History: Kathy Clark's most recent weight is 236 pounds (07/31/19). She is averaging between 80 to 90 grams of protein daily, but she is reaching her calorie goal. Kathy Clark is in the process of getting ready for a move.  Subjective:   Other hyperlipidemia Kathy Clark has hyperlipidemia and her last total cholesterol was 206, LDL was 24 and triglycerides were 133. She is not on medications. Kathy Clark has been trying to improve her cholesterol levels with intensive lifestyle modification including a low saturated fat diet, exercise and weight loss. She denies any chest pain, claudication or myalgias.  Lab Results  Component Value Date   ALT 20 12/12/2018   AST 17 12/12/2018   ALKPHOS 47 08/18/2017   BILITOT 0.7 12/12/2018   Lab Results  Component Value Date   CHOL 206 (A) 02/15/2019   HDL 57 02/15/2019   LDLCALC 124 02/15/2019   TRIG 133 02/15/2019   CHOLHDL 3.6 02/14/2019    Assessment/Plan:   Other hyperlipidemia Cardiovascular risk  and specific lipid/LDL goals reviewed.  We discussed several lifestyle modifications today and [name] will continue to work on diet, exercise and weight loss efforts. Orders and follow up as documented in patient record.   Counseling Intensive lifestyle modifications are the first line treatment for this issue.  Dietary changes: Increase soluble fiber. Decrease simple carbohydrates.  Exercise changes: Moderate to vigorous-intensity aerobic activity 150 minutes per week if tolerated.  Lipid-lowering medications: see documented in medical record.  Obesity Lyfe is currently in the action stage of change. As such, her goal is to continue with weight loss efforts. She has agreed to keeping a food journal and adhering to recommended goals of 1400 calories and 90 grams of protein daily.    Exercise goals: For substantial health benefits, adults should do at least 150 minutes (2 hours and 30 minutes) a week of moderate-intensity, or 75 minutes (1 hour and 15 minutes) a week of vigorous-intensity aerobic physical activity, or an equivalent combination of moderate- and vigorous-intensity aerobic activity. Aerobic activity should be performed in episodes of at least 10 minutes, and preferably, it should be spread throughout the week. Adults should also include muscle-strengthening activities that involve all major muscle groups on 2 or more days a week.  Behavioral modification strategies: decreasing eating out and meal planning and cooking strategies.  Kathy Clark has agreed to follow-up with our clinic in 2 weeks. She was informed of the importance of frequent follow-up visits to maximize her success with intensive lifestyle modifications for her multiple health conditions.  Objective:   VITALS: Per  patient if applicable, see vitals. GENERAL: Alert and in no acute distress. CARDIOPULMONARY: No increased WOB. Speaking in clear sentences.  PSYCH: Pleasant and cooperative. Speech normal rate and rhythm.  Affect is appropriate. Insight and judgement are appropriate. Attention is focused, linear, and appropriate.  NEURO: Oriented as arrived to appointment on time with no prompting.   Lab Results  Component Value Date   CREATININE 0.78 12/12/2018   BUN 16 12/12/2018   NA 139 12/12/2018   K 4.5 12/12/2018   CL 102 12/12/2018   CO2 27 12/12/2018   Lab Results  Component Value Date   ALT 20 12/12/2018   AST 17 12/12/2018   ALKPHOS 47 08/18/2017   BILITOT 0.7 12/12/2018   Lab Results  Component Value Date   HGBA1C 5.1 02/14/2019   HGBA1C 5.1 12/01/2016   HGBA1C 4.9 08/03/2016   Lab Results  Component Value Date   INSULIN 5.4 12/01/2016   INSULIN 5.9 08/03/2016   Lab Results  Component Value Date   TSH 1.470 08/03/2016   Lab Results  Component Value Date   CHOL 206 (A) 02/15/2019   HDL 57 02/15/2019   LDLCALC 124 02/15/2019   TRIG 133 02/15/2019   CHOLHDL 3.6 02/14/2019   Lab Results  Component Value Date   WBC WILL FOLLOW 08/18/2017   HGB WILL FOLLOW 08/18/2017   HCT WILL FOLLOW 08/18/2017   MCV WILL FOLLOW 08/18/2017   PLT WILL FOLLOW 08/18/2017   Lab Results  Component Value Date   FERRITIN 142 12/25/2015    Ref. Range 02/13/2019 09:15  Vitamin D, 25-Hydroxy Latest Ref Range: 30 - 100 ng/mL 29 (L)    Attestation Statements:   Reviewed by clinician on day of visit: allergies, medications, problem list, medical history, surgical history, family history, social history, and previous encounter notes.  Time spent on visit including pre-visit chart review and post-visit care was 30 minutes.   [name], am acting as Location manager for [employer], PA-C.  I have reviewed the above documentation for accuracy and completeness, and I agree with the above. Abby Potash, PA-C

## 2019-08-03 DIAGNOSIS — G4733 Obstructive sleep apnea (adult) (pediatric): Secondary | ICD-10-CM | POA: Diagnosis not present

## 2019-08-11 ENCOUNTER — Encounter: Payer: Self-pay | Admitting: Family Medicine

## 2019-08-14 ENCOUNTER — Telehealth (INDEPENDENT_AMBULATORY_CARE_PROVIDER_SITE_OTHER): Payer: 59 | Admitting: Physician Assistant

## 2019-08-14 ENCOUNTER — Encounter (INDEPENDENT_AMBULATORY_CARE_PROVIDER_SITE_OTHER): Payer: Self-pay | Admitting: Physician Assistant

## 2019-08-14 ENCOUNTER — Other Ambulatory Visit: Payer: Self-pay

## 2019-08-14 DIAGNOSIS — Z6838 Body mass index (BMI) 38.0-38.9, adult: Secondary | ICD-10-CM | POA: Diagnosis not present

## 2019-08-14 DIAGNOSIS — E559 Vitamin D deficiency, unspecified: Secondary | ICD-10-CM | POA: Diagnosis not present

## 2019-08-14 DIAGNOSIS — F3289 Other specified depressive episodes: Secondary | ICD-10-CM

## 2019-08-14 DIAGNOSIS — E66812 Morbid (severe) obesity due to excess calories: Secondary | ICD-10-CM

## 2019-08-14 NOTE — Progress Notes (Signed)
TeleHealth Visit:  Due to the COVID-19 pandemic, this visit was completed with telemedicine (audio/video) technology to reduce patient and provider exposure as well as to preserve personal protective equipment.   Kathy Clark has verbally consented to this TeleHealth visit. The patient is located at home, the provider is located at the [facility] office. The participants in this visit include the listed provider and patient. The visit was conducted today via webex.   Chief Complaint: OBESITY Kathy Clark is here to discuss her progress with her obesity treatment plan along with follow-up of her obesity related diagnoses. Kathy Clark is on keeping a food journal and adhering to recommended goals of 1400 calories and 90 grams of protein daily and states she is following her eating plan approximately 75% of the time. Kathy Clark states she is walking for 30 minutes 2 times per week.  Today's visit was #: 59 Starting weight: 237 lbs Starting date: 08/03/16  Interim History: Ketara's most recent weight was 230 lbs. She has been tracking her food better, but she states that she is not always meeting her protein goal. She has been eating out more.  Subjective:   1. Vitamin D deficiency Kathy Clark's last Vit D level was 28. She is on weekly Vit D, and she denies nausea, vomiting, or muscle weakness.  2. Other depression Kathy Clark is on bupropion. She is not sure that her Wellbutrin is helping with cravings.  Assessment/Plan:   1. Vitamin D deficiency Low Vitamin D level contributes to fatigue and are associated with obesity, breast, and colon cancer. We will refill prescription Vitamin D for 1 month. Kathy Clark will follow-up for routine testing of Vitamin D, at least 2-3 times per year to avoid over-replacement.  - Vitamin D, Ergocalciferol, (DRISDOL) 1.25 MG (50000 UNIT) CAPS capsule; Take 1 capsule (50,000 Units total) by mouth every 7 (seven) days.  Dispense: 4 capsule; Refill: 0  2. Other depression  Behavior modification techniques were discussed today to help Kathy Clark deal with her emotional/non-hunger eating behaviors. We will refill bupropion for 1 month. Orders and follow up as documented in patient record.   - buPROPion (WELLBUTRIN SR) 150 MG 12 hr tablet; Take 1 tablet (150 mg total) by mouth daily.  Dispense: 30 tablet; Refill: 0  3. Class 2 severe obesity with serious comorbidity and body mass index (BMI) of 38.0 to 38.9 in adult, unspecified obesity type (HCC) Kathy Clark is currently in the action stage of change. As such, her goal is to continue with weight loss efforts. She has agreed to keeping a food journal and adhering to recommended goals of 1400 calories and 90 grams of protein daily.   Exercise goals: No exercise has been prescribed at this time.  Behavioral modification strategies: decreasing eating out and meal planning and cooking strategies.  Kathy Clark has agreed to follow-up with our clinic in 2 weeks with Dr. Adair Patter. She was informed of the importance of frequent follow-up visits to maximize her success with intensive lifestyle modifications for her multiple health conditions.  Objective:   VITALS: Per patient if applicable, see vitals. GENERAL: Alert and in no acute distress. CARDIOPULMONARY: No increased WOB. Speaking in clear sentences.  PSYCH: Pleasant and cooperative. Speech normal rate and rhythm. Affect is appropriate. Insight and judgement are appropriate. Attention is focused, linear, and appropriate.  NEURO: Oriented as arrived to appointment on time with no prompting.   Lab Results  Component Value Date   CREATININE 0.78 12/12/2018   BUN 16 12/12/2018   NA 139  12/12/2018   K 4.5 12/12/2018   CL 102 12/12/2018   CO2 27 12/12/2018   Lab Results  Component Value Date   ALT 20 12/12/2018   AST 17 12/12/2018   ALKPHOS 47 08/18/2017   BILITOT 0.7 12/12/2018   Lab Results  Component Value Date   HGBA1C 5.1 02/14/2019   HGBA1C 5.1 12/01/2016   HGBA1C 4.9  08/03/2016   Lab Results  Component Value Date   INSULIN 5.4 12/01/2016   INSULIN 5.9 08/03/2016   Lab Results  Component Value Date   TSH 1.470 08/03/2016   Lab Results  Component Value Date   CHOL 206 (A) 02/15/2019   HDL 57 02/15/2019   LDLCALC 124 02/15/2019   TRIG 133 02/15/2019   CHOLHDL 3.6 02/14/2019   Lab Results  Component Value Date   WBC WILL FOLLOW 08/18/2017   HGB WILL FOLLOW 08/18/2017   HCT WILL FOLLOW 08/18/2017   MCV WILL FOLLOW 08/18/2017   PLT WILL FOLLOW 08/18/2017   Lab Results  Component Value Date   FERRITIN 142 12/25/2015    Attestation Statements:   Reviewed by clinician on day of visit: allergies, medications, problem list, medical history, surgical history, family history, social history, and previous encounter notes.   Wilhemena Durie, am acting as transcriptionist for [employer], PA-C.  I have reviewed the above documentation for accuracy and completeness, and I agree with the above. Abby Potash, PA-C

## 2019-08-15 ENCOUNTER — Other Ambulatory Visit: Payer: Self-pay | Admitting: Family Medicine

## 2019-08-15 MED ORDER — VITAMIN D (ERGOCALCIFEROL) 1.25 MG (50000 UNIT) PO CAPS: 50000.0000 [IU] | ORAL_CAPSULE | ORAL | 0 refills | Status: DC

## 2019-08-15 MED ORDER — BUPROPION HCL ER (SR) 150 MG PO TB12: 150.0000 mg | ORAL_TABLET | Freq: Every day | ORAL | 0 refills | Status: DC

## 2019-08-15 MED FILL — HYDROCHLOROTHIAZIDE 12.5 MG: 12.5 | 90 days supply | Qty: 90 | Fill #2

## 2019-08-15 MED FILL — VIT D2 1.25 MG (50,000 UNIT: 1.25 MG | 28 days supply | Qty: 4 | Fill #0

## 2019-08-15 MED FILL — LOSARTAN POTASSIUM 25 MG TA: 25 | 90 days supply | Qty: 90 | Fill #2

## 2019-08-15 MED FILL — BUPROPION HCL ER (SR) 150 M: 150 | 30 days supply | Qty: 30 | Fill #0

## 2019-08-15 MED FILL — clonazePAM 0.25 MG TBDP: 0.25 | 30 days supply | Qty: 60 | Fill #0

## 2019-08-28 ENCOUNTER — Encounter (INDEPENDENT_AMBULATORY_CARE_PROVIDER_SITE_OTHER): Payer: Self-pay | Admitting: Family Medicine

## 2019-08-28 NOTE — Telephone Encounter (Signed)
FYI

## 2019-08-29 ENCOUNTER — Ambulatory Visit (INDEPENDENT_AMBULATORY_CARE_PROVIDER_SITE_OTHER): Payer: 59 | Admitting: Family Medicine

## 2019-09-06 IMAGING — DX DG SHOULDER 2+V*R*
3 series · 3 of 3 positions shown · non-contrast
Comparison: None.

CLINICAL DATA: Two weeks lateral RIGHT shoulder pain, feeling a
pop. Denies injury.

EXAM:
RIGHT SHOULDER - 2+ VIEW

[shoulder grashey]
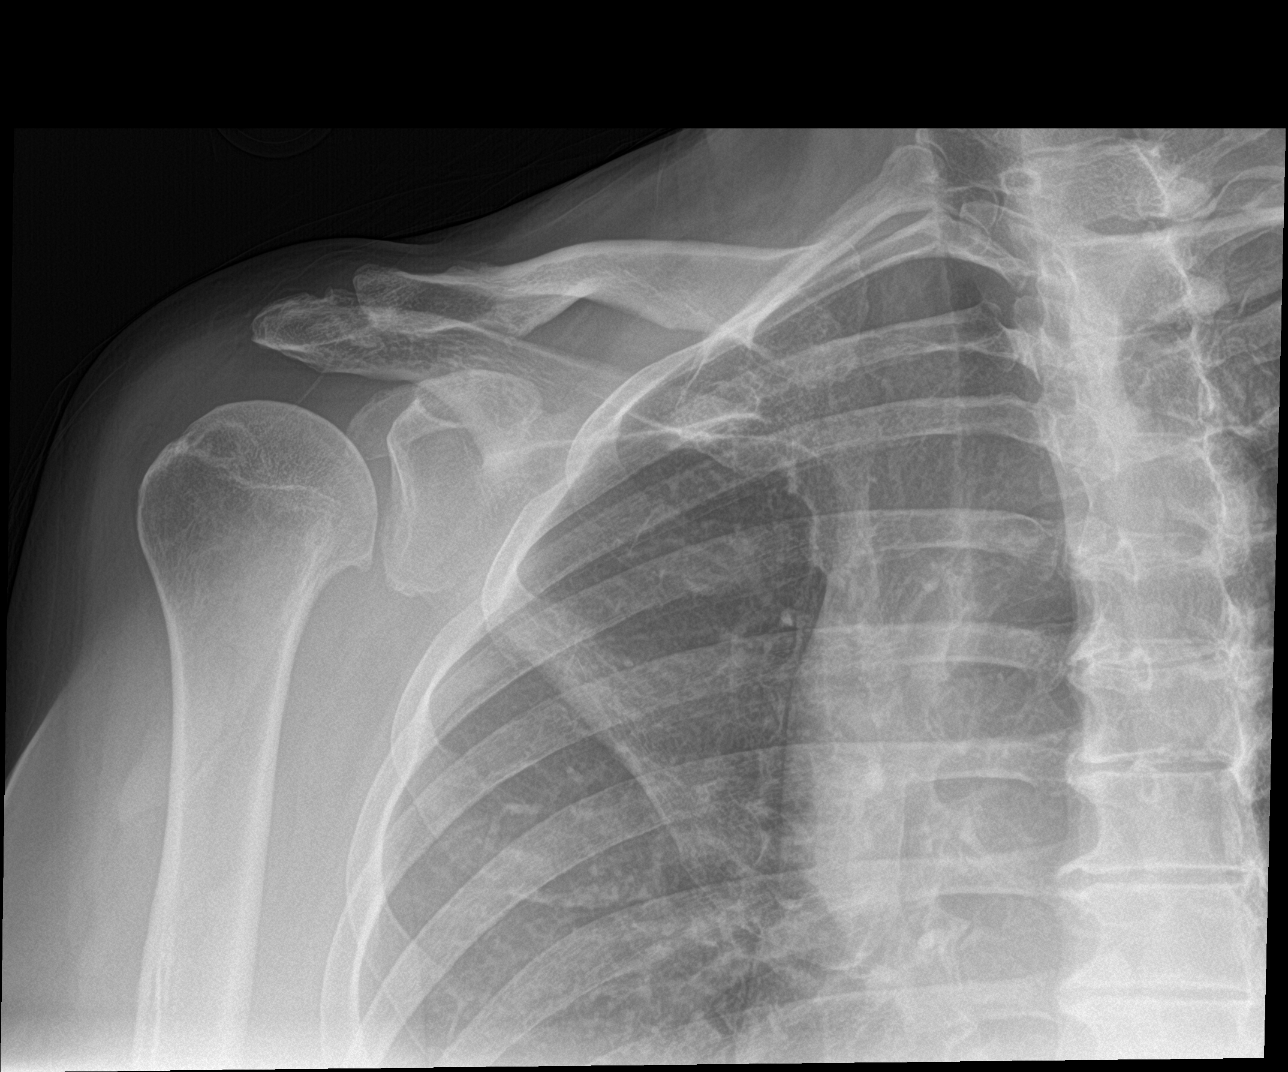

[shoulder axillary]
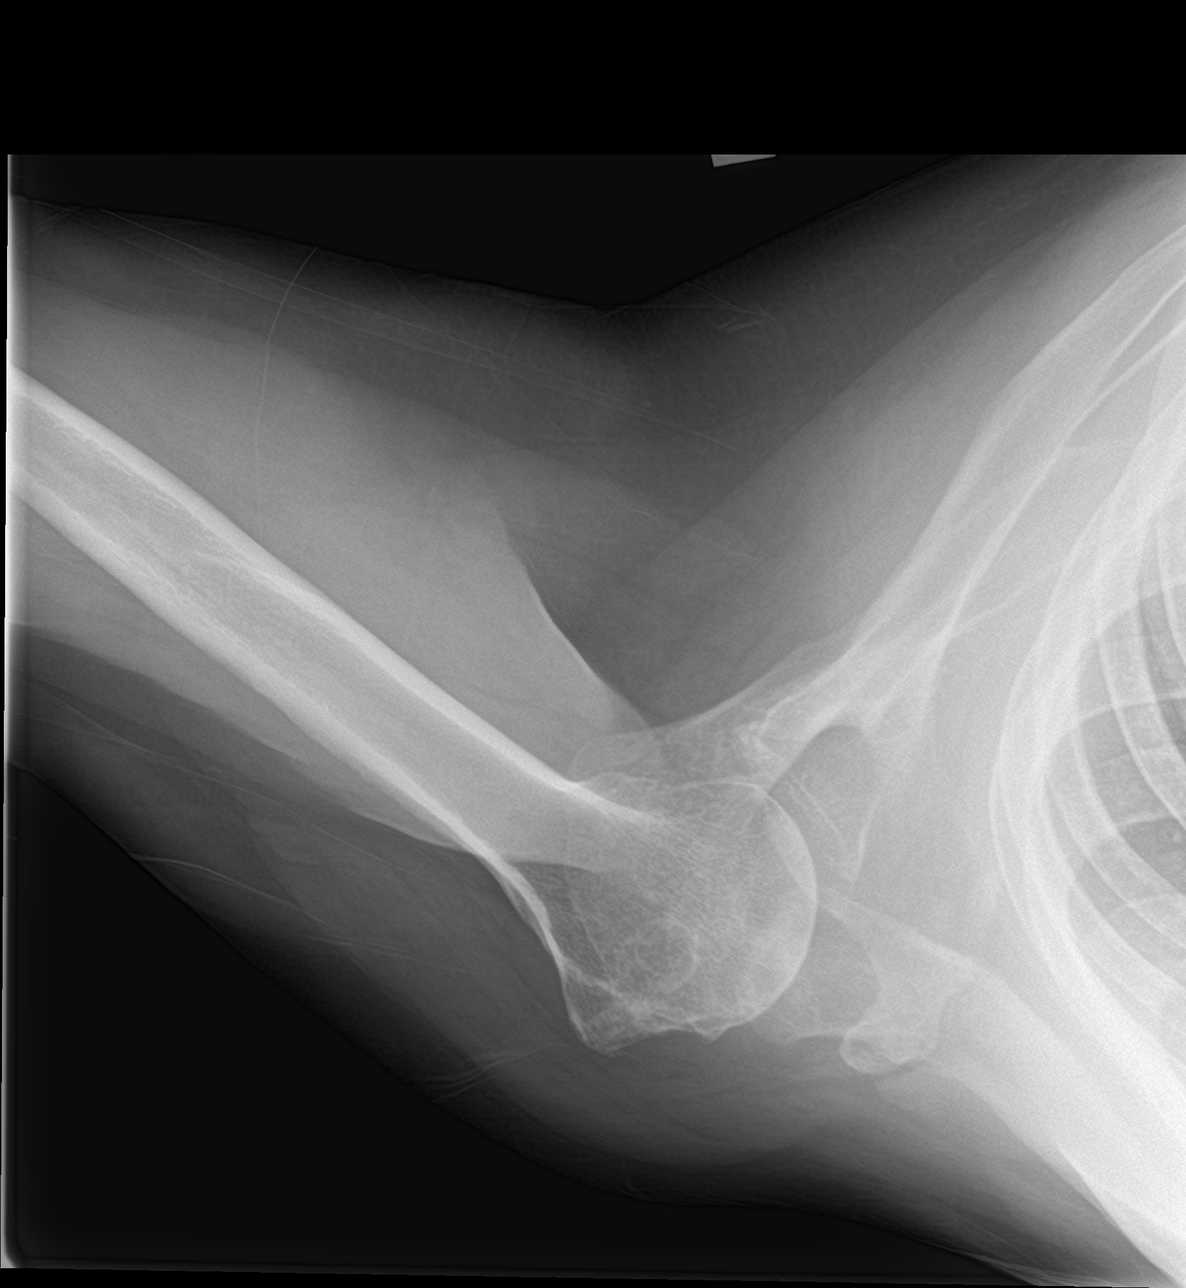

[shoulder y view]
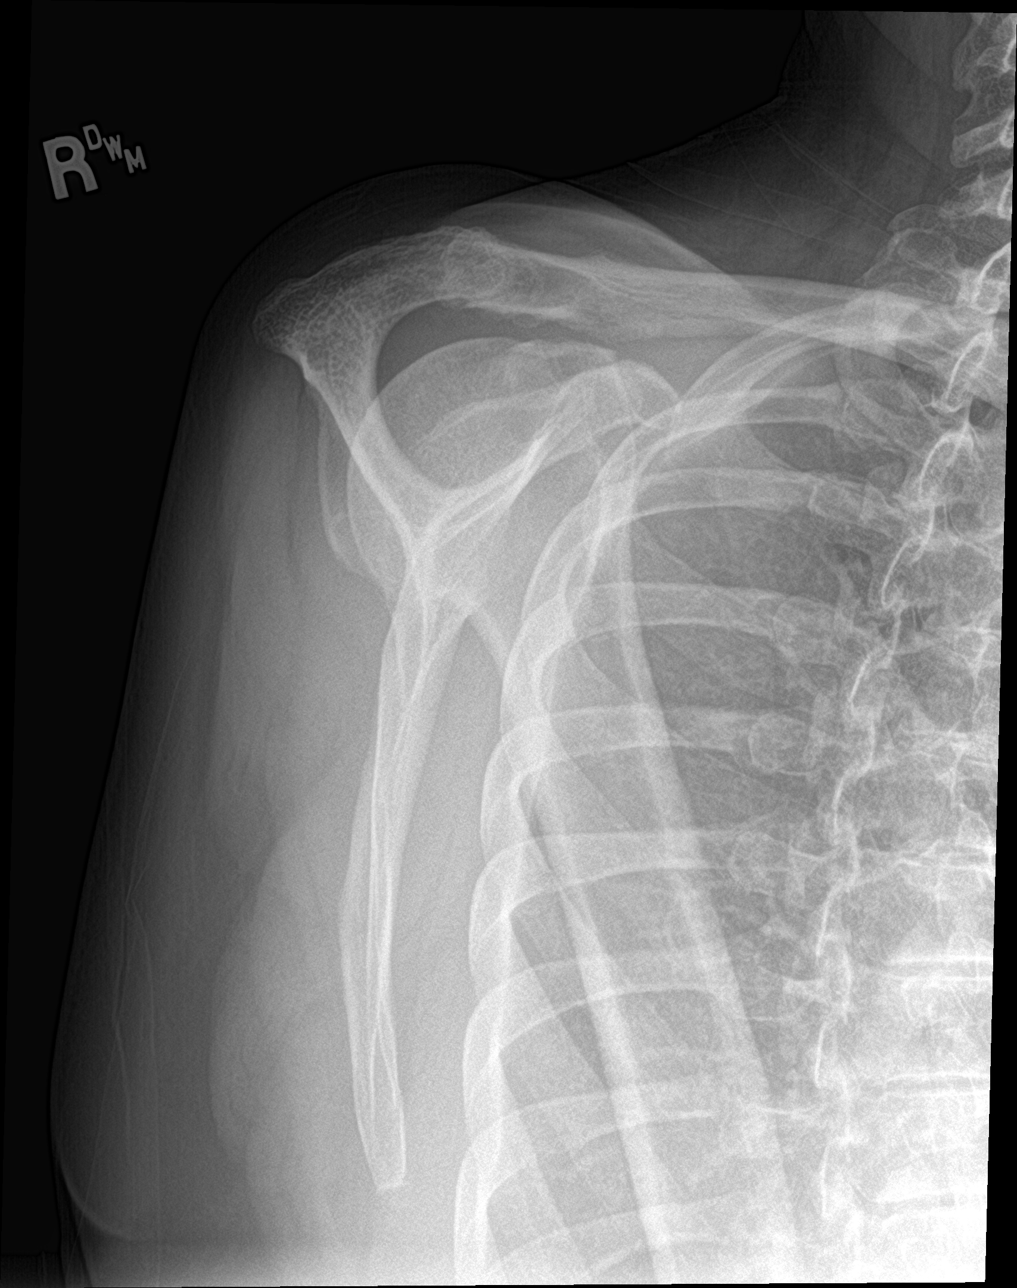

[3 of 3 positions shown; findings below may reference images not displayed]

FINDINGS: Osseous alignment is normal. No acute or suspicious osseous lesion.
No degenerative change at the glenohumeral joint space. Minimal
degenerative spurring at the acromioclavicular joint space. Soft
tissues about the RIGHT shoulder are unremarkable.
IMPRESSION: No acute findings.  No significant degenerative change.

## 2019-09-13 ENCOUNTER — Encounter: Payer: Self-pay | Admitting: Family Medicine

## 2019-09-19 MED FILL — GABAPENTIN 300 MG CAPSULE: 300 | 90 days supply | Qty: 180 | Fill #0

## 2019-09-19 MED FILL — DOTTI 0.075 MG/24HR PTTW: 0.075 | 28 days supply | Qty: 8 | Fill #0

## 2019-10-02 ENCOUNTER — Encounter (INDEPENDENT_AMBULATORY_CARE_PROVIDER_SITE_OTHER): Payer: Self-pay | Admitting: Physician Assistant

## 2019-10-02 ENCOUNTER — Other Ambulatory Visit (INDEPENDENT_AMBULATORY_CARE_PROVIDER_SITE_OTHER): Payer: Self-pay | Admitting: Physician Assistant

## 2019-10-02 DIAGNOSIS — F3289 Other specified depressive episodes: Secondary | ICD-10-CM

## 2019-10-02 NOTE — Telephone Encounter (Signed)
Please advise pt does not currently have a future appt?

## 2019-10-02 NOTE — Telephone Encounter (Signed)
Can we fit her in as a virtual to get these refilled by the end of the month?

## 2019-10-03 ENCOUNTER — Ambulatory Visit (INDEPENDENT_AMBULATORY_CARE_PROVIDER_SITE_OTHER): Payer: 59

## 2019-10-03 ENCOUNTER — Other Ambulatory Visit: Payer: Self-pay

## 2019-10-03 ENCOUNTER — Ambulatory Visit (INDEPENDENT_AMBULATORY_CARE_PROVIDER_SITE_OTHER): Payer: 59 | Admitting: Family Medicine

## 2019-10-03 ENCOUNTER — Encounter: Payer: Self-pay | Admitting: Family Medicine

## 2019-10-03 VITALS — BP 131/77 | HR 76 | Ht 67.0 in | Wt 243.0 lb

## 2019-10-03 DIAGNOSIS — M25542 Pain in joints of left hand: Secondary | ICD-10-CM | POA: Diagnosis not present

## 2019-10-03 DIAGNOSIS — M25512 Pain in left shoulder: Secondary | ICD-10-CM

## 2019-10-03 DIAGNOSIS — M19042 Primary osteoarthritis, left hand: Secondary | ICD-10-CM

## 2019-10-03 DIAGNOSIS — M25541 Pain in joints of right hand: Secondary | ICD-10-CM | POA: Insufficient documentation

## 2019-10-03 DIAGNOSIS — M25511 Pain in right shoulder: Secondary | ICD-10-CM | POA: Diagnosis not present

## 2019-10-03 DIAGNOSIS — M19041 Primary osteoarthritis, right hand: Secondary | ICD-10-CM

## 2019-10-03 MED ORDER — VIIBRYD 40 MG PO TABS: 40.0000 mg | ORAL_TABLET | Freq: Every day | ORAL | 1 refills | Status: DC

## 2019-10-03 MED FILL — VIIBRYD 40 MG TABLET: 40 | 90 days supply | Qty: 90 | Fill #0

## 2019-10-03 NOTE — Assessment & Plan Note (Signed)
Arthralgia of both hands-she does actually have some bogginess between the second and third MCPs.  Will evaluate further with labs for rheumatoid as well as getting plain film x-rays to look for again rheumatoid versus osteoarthritis.  Also check uric acid level.  It sounds like she is having a diffuse flare with her joints.  We also discussed eating a low inflammatory diet by cutting out carbs and processed foods.  She admits she has been eating a little bit more of this recently as she and her fianc have been helping to take care of his mother who is on hospice and so have been traveling and getting food on the go.  She is unable to take NSAIDs because it causes diarrhea so is mostly been relying on Tylenol.  We did discuss a trial of Voltaren gel just to see if it is helpful.  If it is not she will let me know.

## 2019-10-03 NOTE — Progress Notes (Signed)
She reports that she has been having joint pain x 1 month.   She thought that it was due to recently moved.

## 2019-10-03 NOTE — Progress Notes (Signed)
Established Patient Office Visit  Subjective:  Patient ID: Kathy Clark, female    DOB: 1961-07-11  Age: 59 y.o. MRN: [id number]  CC:  Chief Complaint  Patient presents with  . Joint Pain    HPI Kathy Clark presents for joint pain that is been particularly problem some for the last month.  Mostly affecting her shoulders knees elbows hands and feet.  She recently had a flare with her back but says that was from lifting heavy boxes while recently moving and says that is actually a little bit better.  She feels a lot of stiffness in the joints.  She has had some occasional swelling and redness.  She does have a maternal grandmother and a maternal cousin who were diagnosed with rheumatoid arthritis.  She was given a round of prednisone for her back and says it actually really made a big difference in her joint pain but as soon as the prednisone wear off the joint pain seem to come back even though her back pain was improved.  + fam hx of RA in her MGM and maternal cousin.    Past Medical History:  Diagnosis Date  . Anxiety   . Back pain   . 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 Depression   . Essential hypertension, benign   . Gastritis   . GERD (gastroesophageal reflux disease)   . Hypertension   . Joint pain   . Lymphocytic colitis    microscopic  . Migraines   . Osteoarthritis   . Sciatica    right leg  . Swelling    feet, left foot    Past Surgical History:  Procedure Laterality Date  . ABDOMINAL HYSTERECTOMY  07-2005  . [place] Bunionectomy Left 09/18/2016   Left Foot  . BACK SURGERY    . KNEE SURGERY  2006   left; multiple  . microdiscetomy  2005  . SHOULDER SURGERY  07-22-09   right  . SKIN CANCER EXCISION     eyelid  . TOTAL KNEE ARTHROPLASTY  07/27/2011   Procedure: TOTAL KNEE ARTHROPLASTY;  Surgeon: Gearlean Alf;  Location: [facility];  Service: Orthopedics;  Laterality: Left;    Family History  Problem Relation Age of Onset   . Breast cancer Unknown   . Tongue cancer Mother   . Breast cancer Mother   . Anxiety disorder Mother   . Depression Mother   . Heart disease Father   . Hypertension Father   . Hyperlipidemia Father   . Stroke Paternal Grandmother   . Colon cancer Neg Hx     Social History   Socioeconomic History  . Marital status: Divorced    Spouse name: Jenny Reichmann  . Number of children: 2  . Years of education: Not on file  . Highest education level: Not on file  Occupational History  . Occupation: Programmer, multimedia: [employer]  Tobacco Use  . Smoking status: Never Smoker  . Smokeless tobacco: Never Used  Substance and Sexual Activity  . Alcohol use: Yes    Comment: occasionally  . Drug use: No  . 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  Other Topics Concern  . Not on file  Social History Narrative  . Not on file   Social Determinants of Health   Financial Resource Strain:   . Difficulty of Paying Living Expenses:   Food Insecurity:   .  Worried About Charity fundraiser in the Last Year:   . Arboriculturist in the Last Year:   Transportation Needs:   . Film/video editor (Medical):   [name] Lack of Transportation (Non-Medical):   Physical Activity:   . Days of Exercise per Week:   . Minutes of Exercise per Session:   Stress:   . Feeling of Stress :   Social Connections:   . Frequency of Communication with Friends and Family:   . Frequency of Social Gatherings with Friends and Family:   . Attends Religious Services:   . Active Member of Clubs or Organizations:   . Attends Archivist Meetings:   [name] Marital Status:   Intimate Partner Violence:   . Fear of Current or Ex-Partner:   . Emotionally Abused:   [name] Physically Abused:   . Sexually Abused:     Outpatient Medications Prior to Visit  Medication Sig Dispense Refill  . AMBULATORY NON FORMULARY MEDICATION Medication Name: Please decrease CPAP setting to 8 cm  of water pressure and fax [place] a download after 5 days.  Is having a lot of difficulty with air leaks is working to try to reduce her pressure to see if she is still being adequately treated but with fewer leaks.FAx to [place] 1 Units 0  . buPROPion (WELLBUTRIN SR) 150 MG 12 hr tablet Take 1 tablet (150 mg total) by mouth daily. 30 tablet 0  . Calcium Carbonate-Vitamin D (CALTRATE 600+D PO) Take 1 tablet by mouth daily.    . clotrimazole-betamethasone (LOTRISONE) cream Apply 1 application topically at bedtime as needed. 30 g 1  . EPINEPHrine 0.3 mg/0.3 mL IJ SOAJ injection Inject 0.3 mLs (0.3 mg total) into the muscle as needed for anaphylaxis. 1 Device 2  . estradiol (VIVELLE-DOT) 0.075 MG/24HR   3  . gabapentin (NEURONTIN) 300 MG capsule 2 caps p.o. twice daily for a week then increase dose as needed up to a maximum of 3600 mg daily (Patient taking differently: Take 300 mg by mouth 2 (two) times daily. 1 capsule twice daily) 180 capsule 3  . hydrochlorothiazide (MICROZIDE) 12.5 MG capsule TAKE 1 CAPSULE BY MOUTH DAILY. 90 capsule 3  . losartan (COZAAR) 25 MG tablet TAKE 1 TABLET BY MOUTH DAILY. 90 tablet 3  . Melatonin 3 MG TABS Take by mouth as directed.    . Multiple Vitamins-Minerals (MULTIVITAMIN ADULT PO) Take by mouth daily.    . SUMAtriptan (IMITREX) 20 MG/ACT nasal spray PLACE 1 SPRAY INTO THE NOSE EVERY 2 HOURS AS NEEDED FOR. HEADACHE 6 Inhaler 2  . Vitamin D, Ergocalciferol, (DRISDOL) 1.25 MG (50000 UNIT) CAPS capsule Take 1 capsule (50,000 Units total) by mouth every 7 (seven) days. 4 capsule 0  . Vilazodone HCl (VIIBRYD) 40 MG TABS Take 1 tablet (40 mg total) by mouth daily. 90 tablet 1  . clonazePAM (KLONOPIN) 0.5 MG disintegrating tablet DISSOLVE 1 TABLET (0.5 MG TOTAL) BY MOUTH DAILY AS NEEDED. 30 tablet 0  . clonazePAM (KLONOPIN) 1 MG disintegrating tablet Take 0.5 mg by mouth 2 (two) times daily as needed for seizure.     No facility-administered medications prior to  visit.    Allergies  Allergen Reactions  . Succinylcholine Anaphylaxis  . Dilaudid [Hydromorphone Hcl] Nausea And Vomiting  . Nsaids Other (See Comments)    Trigger colits    ROS Review of Systems    Objective:    Physical Exam  Constitutional: She is oriented to person,  place, and time. She appears well-developed and well-nourished.  HENT:  Head: Normocephalic and atraumatic.  Eyes: Conjunctivae and EOM are normal.  Cardiovascular: Normal rate.  Pulmonary/Chest: Effort normal.  Musculoskeletal:     Comments: Tenderness with squeeze test on both hands.  Check she has some fullness between the second and third metacarpal heads.  No erythema of the joints.  Normal range of motion.  No significant deformity.  Elbows are tender over the lateral epicondyles.  Normal range of motion.  Shoulders with normal range of motion but does have pain with full extension and with reaching back.  Also some pain with crossover.  Neurological: She is alert and oriented to person, place, and time.  Skin: Skin is dry. No pallor.  Psychiatric: She has a normal mood and affect. Her behavior is normal.  Vitals reviewed.   BP 131/77   Pulse 76   Ht 5\' 7"  (1.702 m)   Wt 243 lb (110.2 kg)   LMP  (LMP Unknown) Comment: hystrectomy  SpO2 100%   BMI 38.06 kg/m  Wt Readings from Last 3 Encounters:  10/03/19 243 lb (110.2 kg)  06/13/19 243 lb (110.2 kg)  10/18/17 205 lb (93 kg)     Health Maintenance Due  Topic Date Due  . MAMMOGRAM  09/22/2019    There are no preventive care reminders to display for this patient.  Lab Results  Component Value Date   TSH 1.470 08/03/2016   Lab Results  Component Value Date   WBC WILL FOLLOW 08/18/2017   HGB WILL FOLLOW 08/18/2017   HCT WILL FOLLOW 08/18/2017   MCV WILL FOLLOW 08/18/2017   PLT WILL FOLLOW 08/18/2017   Lab Results  Component Value Date   NA 139 12/12/2018   K 4.5 12/12/2018   CO2 27 12/12/2018   GLUCOSE 86 12/12/2018   BUN 16  12/12/2018   CREATININE 0.78 12/12/2018   BILITOT 0.7 12/12/2018   ALKPHOS 47 08/18/2017   AST 17 12/12/2018   ALT 20 12/12/2018   PROT 6.7 12/12/2018   ALBUMIN 4.4 08/18/2017   CALCIUM 9.1 12/12/2018   Lab Results  Component Value Date   CHOL 206 (A) 02/15/2019   Lab Results  Component Value Date   HDL 57 02/15/2019   Lab Results  Component Value Date   LDLCALC 124 02/15/2019   Lab Results  Component Value Date   TRIG 133 02/15/2019   Lab Results  Component Value Date   CHOLHDL 3.6 02/14/2019   Lab Results  Component Value Date   HGBA1C 5.1 02/14/2019      Assessment & Plan:   Problem List Items Addressed This Visit      Other   Arthralgia of both hands - Primary    Arthralgia of both hands-she does actually have some bogginess between the second and third MCPs.  Will evaluate further with labs for rheumatoid as well as getting plain film x-rays to look for again rheumatoid versus osteoarthritis.  Also check uric acid level.  It sounds like she is having a diffuse flare with her joints.  We also discussed eating a low inflammatory diet by cutting out carbs and processed foods.  She admits she has been eating a little bit more of this recently as she and her fianc have been helping to take care of his mother who is on hospice and so have been traveling and getting food on the go.  She is unable to take NSAIDs because it causes diarrhea so  is mostly been relying on Tylenol.  We did discuss a trial of Voltaren gel just to see if it is helpful.  If it is not she will let me know.      Relevant Orders   CBC   Sedimentation rate   C-reactive protein   Uric acid   TSH   Rheumatoid factor   Cyclic citrul peptide antibody, IgG   ANA   DG Hand 2 View Left (Completed)   DG Hand 2 View Right (Completed)    Other Visit Diagnoses    Acute pain of both shoulders       Relevant Orders   CBC   Sedimentation rate   C-reactive protein   Uric acid   TSH   Rheumatoid  factor   Cyclic citrul peptide antibody, IgG   ANA   DG Hand 2 View Left (Completed)   DG Hand 2 View Right (Completed)      Meds ordered this encounter  Medications  . Vilazodone HCl (VIIBRYD) 40 MG TABS    Sig: Take 1 tablet (40 mg total) by mouth daily.    Dispense:  90 tablet    Refill:  1    Follow-up: No follow-ups on file.    Beatrice Lecher, MD

## 2019-10-04 ENCOUNTER — Encounter (INDEPENDENT_AMBULATORY_CARE_PROVIDER_SITE_OTHER): Payer: Self-pay | Admitting: Physician Assistant

## 2019-10-04 ENCOUNTER — Telehealth (INDEPENDENT_AMBULATORY_CARE_PROVIDER_SITE_OTHER): Payer: 59 | Admitting: Physician Assistant

## 2019-10-04 DIAGNOSIS — Z6838 Body mass index (BMI) 38.0-38.9, adult: Secondary | ICD-10-CM | POA: Diagnosis not present

## 2019-10-04 DIAGNOSIS — F3289 Other specified depressive episodes: Secondary | ICD-10-CM | POA: Diagnosis not present

## 2019-10-04 DIAGNOSIS — E559 Vitamin D deficiency, unspecified: Secondary | ICD-10-CM

## 2019-10-04 LAB — RHEUMATOID FACTOR: Rheumatoid fact SerPl-aCnc: <14 IU/mL (ref <14)

## 2019-10-04 LAB — C-REACTIVE PROTEIN: CRP: 14.9 mg/L — ABNORMAL HIGH (ref <8.0)

## 2019-10-04 LAB — CBC
HCT: 40.9 % (ref 35.0–45.0)
Hemoglobin: 13.7 g/dL (ref 11.7–15.5)
MCH: 30 pg (ref 27.0–33.0)
MCHC: 33.5 g/dL (ref 32.0–36.0)
MCV: 89.5 fL (ref 80.0–100.0)
MPV: 10.1 fL (ref 7.5–12.5)
Platelets: 343 10*3/uL (ref 140–400)
RBC: 4.57 10*6/uL (ref 3.80–5.10)
RDW: 12.5 % (ref 11.0–15.0)
WBC: 7.1 10*3/uL (ref 3.8–10.8)

## 2019-10-04 LAB — ANA: Anti Nuclear Antibody (ANA): NEGATIVE

## 2019-10-04 LAB — SEDIMENTATION RATE: Sed Rate: 22 mm/h (ref 0–30)

## 2019-10-04 LAB — TSH: TSH: 2.61 mIU/L (ref 0.40–4.50)

## 2019-10-04 LAB — CYCLIC CITRUL PEPTIDE ANTIBODY, IGG: Cyclic Citrullin Peptide Ab: <16 UNITS

## 2019-10-04 LAB — URIC ACID: Uric Acid, Serum: 4.3 mg/dL (ref 2.5–7.0)

## 2019-10-04 MED ORDER — VITAMIN D (ERGOCALCIFEROL) 1.25 MG (50000 UNIT) PO CAPS: 50000.0000 [IU] | ORAL_CAPSULE | ORAL | 0 refills | Status: DC

## 2019-10-04 MED ORDER — BUPROPION HCL ER (SR) 150 MG PO TB12: 150.0000 mg | ORAL_TABLET | Freq: Every day | ORAL | 0 refills | Status: DC

## 2019-10-04 MED FILL — VIT D2 1.25 MG (50,000 UNIT: 1.25 MG | 30 days supply | Qty: 4 | Fill #0

## 2019-10-04 MED FILL — BUPROPION HCL ER (SR) 150 M: 150 | 30 days supply | Qty: 30 | Fill #0

## 2019-10-04 NOTE — Progress Notes (Signed)
TeleHealth Visit:  Due to the COVID-19 pandemic, this visit was completed with telemedicine (audio/video) technology to reduce patient and provider exposure as well as to preserve personal protective equipment.   Kelahni has verbally consented to this TeleHealth visit. The patient is located at home, the provider is located at the [facility] office. The participants in this visit include the listed provider and patient. The visit was conducted today via Webex.  Chief Complaint: OBESITY Kathy Clark is here to discuss her progress with her obesity treatment plan along with follow-up of her obesity related diagnoses. Kathy Clark is on keeping a food journal and adhering to recommended goals of 1400 calories and 90 grams of protein and states she is following her eating plan approximately 50% of the time. Kathy Clark states she is walking for 30 minutes 3-4 times per week.  Today's visit was #: 48 Starting weight: 237 lbs Starting date: 08/03/2016  Interim History: Kathy Clark's most recent weight is 236 pounds.  She states she does well with breakfast and lunch and falls off plan for dinner due to her fiance wanting to eat out often.  Subjective:   1. Vitamin D deficiency Kathy Clark Vitamin D level was 29 on 02/13/2019. She is currently taking vit D. She denies nausea, vomiting or muscle weakness.  2. Other depression Kathy Clark is struggling with emotional eating and using food for comfort to the extent that it is negatively impacting her health. She has been working on behavior modification techniques to help reduce her emotional eating and has been unsuccessful. She shows no sign of suicidal or homicidal ideations.  She is taking bupropion daily.  Assessment/Plan:   1. Vitamin D deficiency Low Vitamin D level contributes to fatigue and are associated with obesity, breast, and colon cancer. She agrees to continue to take prescription Vitamin D @50 ,000 IU every week and will follow-up for routine testing of  Vitamin D, at least 2-3 times per year to avoid over-replacement. - Vitamin D, Ergocalciferol, (DRISDOL) 1.25 MG (50000 UNIT) CAPS capsule; Take 1 capsule (50,000 Units total) by mouth every 7 (seven) days.  Dispense: 4 capsule; Refill: 0  2. Other depression Behavior modification techniques were discussed today to help Kathy Clark deal with her emotional/non-hunger eating behaviors.  Orders and follow up as documented in patient record.  - buPROPion (WELLBUTRIN SR) 150 MG 12 hr tablet; Take 1 tablet (150 mg total) by mouth daily.  Dispense: 30 tablet; Refill: 0  3. Class 2 severe obesity with serious comorbidity and body mass index (BMI) of 38.0 to 38.9 in adult, unspecified obesity type (HCC) Kathy Clark is currently in the action stage of change. As such, her goal is to continue with weight loss efforts. She has agreed to keeping a food journal and adhering to recommended goals of 1400 calories and 90 grams of protein.   Exercise goals: As is.  Behavioral modification strategies: decreasing eating out and meal planning and cooking strategies.  Kathy Clark has agreed to follow-up with our clinic in 3 weeks. She was informed of the importance of frequent follow-up visits to maximize her success with intensive lifestyle modifications for her multiple health conditions.  Objective:   VITALS: Per patient if applicable, see vitals. GENERAL: Alert and in no acute distress. CARDIOPULMONARY: No increased WOB. Speaking in clear sentences.  PSYCH: Pleasant and cooperative. Speech normal rate and rhythm. Affect is appropriate. Insight and judgement are appropriate. Attention is focused, linear, and appropriate.  NEURO: Oriented as arrived to appointment on time with no  prompting.   Lab Results  Component Value Date   CREATININE 0.78 12/12/2018   BUN 16 12/12/2018   NA 139 12/12/2018   K 4.5 12/12/2018   CL 102 12/12/2018   CO2 27 12/12/2018   Lab Results  Component Value Date   ALT 20 12/12/2018   AST 17  12/12/2018   ALKPHOS 47 08/18/2017   BILITOT 0.7 12/12/2018   Lab Results  Component Value Date   HGBA1C 5.1 02/14/2019   HGBA1C 5.1 12/01/2016   HGBA1C 4.9 08/03/2016   Lab Results  Component Value Date   INSULIN 5.4 12/01/2016   INSULIN 5.9 08/03/2016   Lab Results  Component Value Date   TSH 2.61 10/03/2019   Lab Results  Component Value Date   CHOL 206 (A) 02/15/2019   HDL 57 02/15/2019   LDLCALC 124 02/15/2019   TRIG 133 02/15/2019   CHOLHDL 3.6 02/14/2019   Lab Results  Component Value Date   WBC 7.1 10/03/2019   HGB 13.7 10/03/2019   HCT 40.9 10/03/2019   MCV 89.5 10/03/2019   PLT 343 10/03/2019   Lab Results  Component Value Date   FERRITIN 142 12/25/2015   Attestation Statements:   Reviewed by clinician on day of visit: allergies, medications, problem list, medical history, surgical history, family history, social history, and previous encounter notes.  I, Water quality scientist, CMA, am acting as Location manager for [employer], PA-C.  I have reviewed the above documentation for accuracy and completeness, and I agree with the above. Abby Potash, PA-C

## 2019-10-05 ENCOUNTER — Encounter: Payer: Self-pay | Admitting: Family Medicine

## 2019-10-05 DIAGNOSIS — I10 Essential (primary) hypertension: Secondary | ICD-10-CM

## 2019-10-12 ENCOUNTER — Other Ambulatory Visit: Payer: Self-pay | Admitting: Family Medicine

## 2019-10-12 DIAGNOSIS — I10 Essential (primary) hypertension: Secondary | ICD-10-CM

## 2019-10-12 MED FILL — DOTTI 0.075 MG/24HR PTTW: 0.075 | 28 days supply | Qty: 8 | Fill #1

## 2019-10-16 MED ORDER — NABUMETONE 500 MG PO TABS: 500.0000 mg | ORAL_TABLET | Freq: Every day | ORAL | 1 refills | Status: DC | PRN

## 2019-10-16 MED ORDER — HYDROCHLOROTHIAZIDE 25 MG PO TABS: 25.0000 mg | ORAL_TABLET | Freq: Every day | ORAL | 1 refills | Status: DC

## 2019-10-16 MED FILL — HYDROCHLOROTHIAZIDE 25 MG T: 25 | 90 days supply | Qty: 90 | Fill #0

## 2019-10-16 MED FILL — NABUMETONE 500 MG TABS: 500 | 30 days supply | Qty: 30 | Fill #0

## 2019-10-26 ENCOUNTER — Other Ambulatory Visit (HOSPITAL_COMMUNITY): Payer: Self-pay | Admitting: Obstetrics and Gynecology

## 2019-10-26 DIAGNOSIS — G4733 Obstructive sleep apnea (adult) (pediatric): Secondary | ICD-10-CM | POA: Diagnosis not present

## 2019-10-26 DIAGNOSIS — Z01419 Encounter for gynecological examination (general) (routine) without abnormal findings: Secondary | ICD-10-CM | POA: Diagnosis not present

## 2019-10-26 DIAGNOSIS — Z6837 Body mass index (BMI) 37.0-37.9, adult: Secondary | ICD-10-CM | POA: Diagnosis not present

## 2019-10-30 ENCOUNTER — Other Ambulatory Visit: Payer: Self-pay

## 2019-10-30 ENCOUNTER — Encounter (INDEPENDENT_AMBULATORY_CARE_PROVIDER_SITE_OTHER): Payer: Self-pay | Admitting: Physician Assistant

## 2019-10-30 ENCOUNTER — Ambulatory Visit (INDEPENDENT_AMBULATORY_CARE_PROVIDER_SITE_OTHER): Payer: 59 | Admitting: Physician Assistant

## 2019-10-30 VITALS — BP 113/75 | HR 70 | Temp 98.0°F | Ht 67.0 in | Wt 237.0 lb

## 2019-10-30 DIAGNOSIS — E7849 Other hyperlipidemia: Secondary | ICD-10-CM | POA: Diagnosis not present

## 2019-10-30 DIAGNOSIS — R739 Hyperglycemia, unspecified: Secondary | ICD-10-CM | POA: Diagnosis not present

## 2019-10-30 DIAGNOSIS — Z6837 Body mass index (BMI) 37.0-37.9, adult: Secondary | ICD-10-CM | POA: Diagnosis not present

## 2019-10-30 DIAGNOSIS — E559 Vitamin D deficiency, unspecified: Secondary | ICD-10-CM | POA: Diagnosis not present

## 2019-10-30 DIAGNOSIS — Z9189 Other specified personal risk factors, not elsewhere classified: Secondary | ICD-10-CM | POA: Diagnosis not present

## 2019-10-30 NOTE — Progress Notes (Signed)
Chief Complaint:   [place] is here to discuss her progress with her obesity treatment plan along with follow-up of her obesity related diagnoses. Reika is keeping a food journal and adhering to recommended goals of 1400 calories and 90 grams of protein and states she is following her eating plan approximately 80% of the time. Teniya states she is walking 30 minutes 3 times per week.  Today's visit was #: 43 Starting weight: 237 lbs Starting date: 08/03/2016 Today's weight: 237 lbs Today's date: 10/30/2019 Total lbs lost to date: 0 Total lbs lost since last in-office visit: 0  Interim History: Sherrica reports that her hands have been swollen and her PCP put her on an "anti-inflammatory" diet.   Subjective:   Other hyperlipidemia. Kirpa is on no medications. No chest pain. She is exercising regularly.  Lab Results  Component Value Date   CHOL 206 (A) 02/15/2019   HDL 57 02/15/2019   LDLCALC 124 02/15/2019   TRIG 133 02/15/2019   CHOLHDL 3.6 02/14/2019   Lab Results  Component Value Date   ALT 20 12/12/2018   AST 17 12/12/2018   ALKPHOS 47 08/18/2017   BILITOT 0.7 12/12/2018   The 10-year ASCVD risk score [name] DC Jr., et al., 2013) is: 3.1%*   Values used to calculate the score:     Age: 44 years     Sex: Female     Is Non-Hispanic African American: No     Diabetic: No     Tobacco smoker: No     Systolic Blood Pressure: 123456 mmHg     Is BP treated: Yes     HDL Cholesterol: 57 mg/dL*     Total Cholesterol: 206 mg/dL*     * - Cholesterol units were assumed for this score calculation  Vitamin D deficiency. Shermya is on Vitamin D. No nausea, vomiting, or muscle weakness. Last Vitamin D 29 on 02/13/2019.  Hyperglycemia. Areli is on no medications. No polyphagia.  At risk for diabetes mellitus. Vanity is at higher than average risk for developing diabetes due to her obesity.   Assessment/Plan:   Other hyperlipidemia. Cardiovascular risk and specific  lipid/LDL goals reviewed.  We discussed several lifestyle modifications today and Taj will continue to work on diet, exercise and weight loss efforts. Orders and follow up as documented in patient record. Lipid panel ordered today.  Counseling Intensive lifestyle modifications are the first line treatment for this issue. . Dietary changes: Increase soluble fiber. Decrease simple carbohydrates. . Exercise changes: Moderate to vigorous-intensity aerobic activity 150 minutes per week if tolerated. . Lipid-lowering medications: see documented in medical record.   Vitamin D deficiency. Low Vitamin D level contributes to fatigue and are associated with obesity, breast, and colon cancer. She agrees to continue to take Vitamin D and VITAMIN D 25 Hydroxy (Vit-D Deficiency, Fractures) level was ordered today.  Hyperglycemia. Fasting labs will be obtained and results with be discussed with Talayeh in 2 weeks at her follow up visit. In the meanwhile [place] was started on a lower simple carbohydrate diet and will work on weight loss efforts. Comprehensive metabolic panel, Hemoglobin A1c, Insulin, random ordered today.  At risk for diabetes mellitus. Monque was given approximately 15 minutes of diabetes education and counseling today. We discussed intensive lifestyle modifications today with an emphasis on weight loss as well as increasing exercise and decreasing simple carbohydrates in her diet. We also reviewed medication options with an emphasis on risk versus benefit of  those discussed.   Repetitive spaced learning was employed today to elicit superior memory formation and behavioral change.  Class 2 severe obesity with serious comorbidity and body mass index (BMI) of 37.0 to 37.9 in adult, unspecified obesity type ([place]).  Kloey is currently in the action stage of change. As such, her goal is to continue with weight loss efforts. She has agreed to following a lower carbohydrate, vegetable and lean protein rich  diet plan.   Exercise goals: For substantial health benefits, adults should do at least 150 minutes (2 hours and 30 minutes) a week of moderate-intensity, or 75 minutes (1 hour and 15 minutes) a week of vigorous-intensity aerobic physical activity, or an equivalent combination of moderate- and vigorous-intensity aerobic activity. Aerobic activity should be performed in episodes of at least 10 minutes, and preferably, it should be spread throughout the week.  Behavioral modification strategies: meal planning and cooking strategies and keeping healthy foods in the home.  Kaytlynne has agreed to follow-up with our clinic in 3 weeks. She was informed of the importance of frequent follow-up visits to maximize her success with intensive lifestyle modifications for her multiple health conditions.   Roanna was informed we would discuss her lab results at her next visit unless there is a critical issue that needs to be addressed sooner. Nikeeta agreed to keep her next visit at the agreed upon time to discuss these results.  Objective:   Blood pressure 113/75, pulse 70, temperature 98 F (36.7 C), temperature source Oral, height 5\' 7"  (1.702 m), weight 237 lb (107.5 kg), SpO2 98 %. Body mass index is 37.12 kg/m.  General: Cooperative, alert, well developed, in no acute distress. HEENT: Conjunctivae and lids unremarkable. Cardiovascular: Regular rhythm.  Lungs: Normal work of breathing. Neurologic: No focal deficits.   Lab Results  Component Value Date   CREATININE 0.78 12/12/2018   BUN 16 12/12/2018   NA 139 12/12/2018   K 4.5 12/12/2018   CL 102 12/12/2018   CO2 27 12/12/2018   Lab Results  Component Value Date   ALT 20 12/12/2018   AST 17 12/12/2018   ALKPHOS 47 08/18/2017   BILITOT 0.7 12/12/2018   Lab Results  Component Value Date   HGBA1C 5.1 02/14/2019   HGBA1C 5.1 12/01/2016   HGBA1C 4.9 08/03/2016   Lab Results  Component Value Date   INSULIN 5.4 12/01/2016   INSULIN 5.9  08/03/2016   Lab Results  Component Value Date   TSH 2.61 10/03/2019   Lab Results  Component Value Date   CHOL 206 (A) 02/15/2019   HDL 57 02/15/2019   LDLCALC 124 02/15/2019   TRIG 133 02/15/2019   CHOLHDL 3.6 02/14/2019   Lab Results  Component Value Date   WBC 7.1 10/03/2019   HGB 13.7 10/03/2019   HCT 40.9 10/03/2019   MCV 89.5 10/03/2019   PLT 343 10/03/2019   Lab Results  Component Value Date   FERRITIN 142 12/25/2015   Attestation Statements:   Reviewed by clinician on day of visit: allergies, medications, problem list, medical history, surgical history, family history, social history, and previous encounter notes.  IMichaelene Song, am acting as transcriptionist for Abby Potash, PA-C   I have reviewed the above documentation for accuracy and completeness, and I agree with the above. Abby Potash, PA-C

## 2019-10-31 ENCOUNTER — Other Ambulatory Visit (INDEPENDENT_AMBULATORY_CARE_PROVIDER_SITE_OTHER): Payer: Self-pay | Admitting: Physician Assistant

## 2019-10-31 ENCOUNTER — Encounter (INDEPENDENT_AMBULATORY_CARE_PROVIDER_SITE_OTHER): Payer: Self-pay | Admitting: Physician Assistant

## 2019-10-31 DIAGNOSIS — F3289 Other specified depressive episodes: Secondary | ICD-10-CM

## 2019-10-31 DIAGNOSIS — E559 Vitamin D deficiency, unspecified: Secondary | ICD-10-CM

## 2019-10-31 MED FILL — VIT D2 1.25 MG (50,000 UNIT: 1.25 MG | 28 days supply | Qty: 4 | Fill #0

## 2019-10-31 MED FILL — BUPROPION HCL SR 150 MG TAB: 150 | 30 days supply | Qty: 30 | Fill #0

## 2019-10-31 NOTE — Telephone Encounter (Signed)
Ok to send. Thought I refilled. Thanks

## 2019-10-31 NOTE — Telephone Encounter (Signed)
Please advise pt was seen yesterday

## 2019-11-01 ENCOUNTER — Encounter: Payer: Self-pay | Admitting: Family Medicine

## 2019-11-02 ENCOUNTER — Ambulatory Visit (INDEPENDENT_AMBULATORY_CARE_PROVIDER_SITE_OTHER): Payer: 59 | Admitting: Family Medicine

## 2019-11-02 ENCOUNTER — Encounter: Payer: Self-pay | Admitting: Family Medicine

## 2019-11-02 VITALS — BP 129/82 | HR 80 | Wt 242.0 lb

## 2019-11-02 DIAGNOSIS — M25542 Pain in joints of left hand: Secondary | ICD-10-CM | POA: Diagnosis not present

## 2019-11-02 DIAGNOSIS — R5383 Other fatigue: Secondary | ICD-10-CM | POA: Diagnosis not present

## 2019-11-02 DIAGNOSIS — M7918 Myalgia, other site: Secondary | ICD-10-CM | POA: Diagnosis not present

## 2019-11-02 DIAGNOSIS — M2550 Pain in unspecified joint: Secondary | ICD-10-CM

## 2019-11-02 DIAGNOSIS — M25541 Pain in joints of right hand: Secondary | ICD-10-CM | POA: Diagnosis not present

## 2019-11-02 MED ORDER — CYCLOBENZAPRINE HCL 5 MG PO TABS: 5.0000 mg | ORAL_TABLET | Freq: Every evening | ORAL | 1 refills | Status: DC | PRN

## 2019-11-02 MED FILL — CYCLOBENZAPRINE HCL 5 MG TA: 5 | 30 days supply | Qty: 30 | Fill #0

## 2019-11-02 NOTE — Assessment & Plan Note (Addendum)
Labs neg for RA, Gout, etc.  She does have swelling over her MCPs, responds well to steroids and has fam hx of RA. Will refer to rheum to make sure she doesn't have seronegative RA.  Ok to continue Relafen and tylenol PRN>

## 2019-11-02 NOTE — Assessment & Plan Note (Addendum)
Consider autoimmune d/o vs fibromyalgia.  Widespread Pain index score of 12 and Symptom Severity score of 10.  Dx consistent with possible fibromyalgia but need to rule out other dx first.  Discussed trial of muscle relaxer at bedtime.

## 2019-11-02 NOTE — Progress Notes (Signed)
Established Patient Office Visit  Subjective:  Patient ID: Kathy Clark, female    DOB: [birth year]  Age: 59 y.o. MRN: [id number]  CC: No chief complaint on file.   HPI ARMENTHA GREENING presents for of her hand and joint pain.  She feels like it is really all over her body.  Last summer talk to her we did some blood work just to rule out gout, rheumatoid arthritis etc.  Everything came back completely normal and really reassuring.  She says she really started to change of her diet she been eating out a lot as she and her fianc have been traveling to help take care of his mother who is currently on hospice.  She said she really cleaned up her diet and says initially she actually started to notice some improvement.  But in the last 2 days is gotten suddenly worse.  She has noticed some swelling over the [facility] joints over the first and second fingers in both hands.  She also feels like sometimes her muscle tissue and ligaments also feels sore and stiff.  She is had some stiffness and soreness in her upper chest wall bilaterally across her chest.  She says she is not sleeping well because of the pain.  She has been taking Tylenol and the Relafen that I gave her she does says it does help some.  She is also noticed some sensitivity in her legs and feet.  She says she felt a burning sensation on the top of her foot one night and even the sheet touching it was really uncomfortable.  Then she had some numbness and tingling over in her right lower leg.  She does wonder if it could be Lyme's disease.  Past Medical History:  Diagnosis Date  . Anxiety   . Back pain   . 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 Depression   . Essential hypertension, benign   . Gastritis   . GERD (gastroesophageal reflux disease)   . Hypertension   . Joint pain   . Lymphocytic colitis    microscopic  . Migraines   . Osteoarthritis   . Sciatica    right leg  . Swelling    feet, left foot     Past Surgical History:  Procedure Laterality Date  . ABDOMINAL HYSTERECTOMY  07-2005  . [place] Bunionectomy Left 09/18/2016   Left Foot  . BACK SURGERY    . KNEE SURGERY  2006   left; multiple  . microdiscetomy  2005  . SHOULDER SURGERY  07-22-09   right  . SKIN CANCER EXCISION     eyelid  . TOTAL KNEE ARTHROPLASTY  07/27/2011   Procedure: TOTAL KNEE ARTHROPLASTY;  Surgeon: Gearlean Alf;  Location: [facility];  Service: Orthopedics;  Laterality: Left;    Family History  Problem Relation Age of Onset  . Breast cancer Unknown   . Tongue cancer Mother   . Breast cancer Mother   . Anxiety disorder Mother   . Depression Mother   . Heart disease Father   . Hypertension Father   . Hyperlipidemia Father   . Stroke Paternal Grandmother   . Colon cancer Neg Hx     Social History   Socioeconomic History  . Marital status: Divorced    Spouse name: Jenny Reichmann  . Number of children: 2  . Years of education: Not on file  . Highest education level: Not on file  Occupational History  . Occupation: Therapist, sports  Employer: [employer]  Tobacco Use  . Smoking status: Never Smoker  . Smokeless tobacco: Never Used  Substance and Sexual Activity  . Alcohol use: Yes    Comment: occasionally  . Drug use: No  . 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  Other Topics Concern  . Not on file  Social History Narrative  . Not on file   Social Determinants of Health   Financial Resource Strain:   . Difficulty of Paying Living Expenses:   Food Insecurity:   . Worried About Charity fundraiser in the Last Year:   . Arboriculturist in the Last Year:   Transportation Needs:   . Film/video editor (Medical):   [name] Lack of Transportation (Non-Medical):   Physical Activity:   . Days of Exercise per Week:   . Minutes of Exercise per Session:   Stress:   . Feeling of Stress :   Social Connections:   . Frequency of Communication with Friends  and Family:   . Frequency of Social Gatherings with Friends and Family:   . Attends Religious Services:   . Active Member of Clubs or Organizations:   . Attends Archivist Meetings:   [name] Marital Status:   Intimate Partner Violence:   . Fear of Current or Ex-Partner:   . Emotionally Abused:   [name] Physically Abused:   . Sexually Abused:     Outpatient Medications Prior to Visit  Medication Sig Dispense Refill  . AMBULATORY NON FORMULARY MEDICATION Medication Name: Please decrease CPAP setting to 8 cm of water pressure and fax [place] a download after 5 days.  Is having a lot of difficulty with air leaks is working to try to reduce her pressure to see if she is still being adequately treated but with fewer leaks.FAx to [place] 1 Units 0  . buPROPion (WELLBUTRIN SR) 150 MG 12 hr tablet TAKE 1 TABLET (150 MG TOTAL) BY MOUTH DAILY. 30 tablet 0  . Calcium Carbonate-Vitamin D (CALTRATE 600+D PO) Take 1 tablet by mouth daily.    [name] EPINEPHrine 0.3 mg/0.3 mL IJ SOAJ injection Inject 0.3 mLs (0.3 mg total) into the muscle as needed for anaphylaxis. 1 Device 2  . estradiol (VIVELLE-DOT) 0.075 MG/24HR   3  . gabapentin (NEURONTIN) 300 MG capsule 2 caps p.o. twice daily for a week then increase dose as needed up to a maximum of 3600 mg daily (Patient taking differently: 300 mg 2 (two) times daily. 1 capsule TWICE DAILY) 180 capsule 3  . hydrochlorothiazide (HYDRODIURIL) 25 MG tablet Take 1 tablet (25 mg total) by mouth daily. 90 tablet 1  . losartan (COZAAR) 25 MG tablet TAKE 1 TABLET BY MOUTH DAILY. 90 tablet 2  . Melatonin 3 MG TABS Take by mouth as directed.    . Misc Natural Products (OSTEO BI-FLEX ADV TRIPLE ST PO) Take by mouth.    . Multiple Vitamins-Minerals (MULTIVITAMIN ADULT PO) Take by mouth daily.    . nabumetone (RELAFEN) 500 MG tablet Take 1 tablet (500 mg total) by mouth daily as needed for moderate pain. 30 tablet 1  . SUMAtriptan (IMITREX) 20 MG/ACT nasal spray PLACE 1  SPRAY INTO THE NOSE EVERY 2 HOURS AS NEEDED FOR. HEADACHE 6 Inhaler 2  . Vilazodone HCl (VIIBRYD) 40 MG TABS Take 1 tablet (40 mg total) by mouth daily. 90 tablet 1  . Vitamin D, Ergocalciferol, (DRISDOL) 1.25 MG (  50000 UNIT) CAPS capsule TAKE 1 CAPSULE (50,000 UNITS TOTAL) BY MOUTH EVERY 7 (SEVEN) DAYS. 4 capsule 0   No facility-administered medications prior to visit.    Allergies  Allergen Reactions  . Succinylcholine Anaphylaxis  . Dilaudid [Hydromorphone Hcl] Nausea And Vomiting  . Nsaids Other (See Comments)    Trigger colits    ROS Review of Systems    Objective:    Physical Exam  BP 129/82 (BP Location: Left Arm, Patient Position: Sitting, Cuff Size: Normal)   Pulse 80   Wt 242 lb (109.8 kg)   LMP  (LMP Unknown) Comment: hystrectomy  SpO2 98%   BMI 37.90 kg/m  Wt Readings from Last 3 Encounters:  11/02/19 242 lb (109.8 kg)  10/30/19 237 lb (107.5 kg)  10/03/19 243 lb (110.2 kg)     Health Maintenance Due  Topic Date Due  . MAMMOGRAM  09/22/2019    There are no preventive care reminders to display for this patient.  Lab Results  Component Value Date   TSH 2.61 10/03/2019   Lab Results  Component Value Date   WBC 7.1 10/03/2019   HGB 13.7 10/03/2019   HCT 40.9 10/03/2019   MCV 89.5 10/03/2019   PLT 343 10/03/2019   Lab Results  Component Value Date   NA 139 12/12/2018   K 4.5 12/12/2018   CO2 27 12/12/2018   GLUCOSE 86 12/12/2018   BUN 16 12/12/2018   CREATININE 0.78 12/12/2018   BILITOT 0.7 12/12/2018   ALKPHOS 47 08/18/2017   AST 17 12/12/2018   ALT 20 12/12/2018   PROT 6.7 12/12/2018   ALBUMIN 4.4 08/18/2017   CALCIUM 9.1 12/12/2018   Lab Results  Component Value Date   CHOL 206 (A) 02/15/2019   Lab Results  Component Value Date   HDL 57 02/15/2019   Lab Results  Component Value Date   LDLCALC 124 02/15/2019   Lab Results  Component Value Date   TRIG 133 02/15/2019   Lab Results  Component Value Date   CHOLHDL 3.6  02/14/2019   Lab Results  Component Value Date   HGBA1C 5.1 02/14/2019      Assessment & Plan:   Problem List Items Addressed This Visit      Other   Polyarthralgia - Primary    Labs neg for RA, Gout, etc.  She does have swelling over her MCPs, responds well to steroids and has fam hx of RA. Will refer to rheum to make sure she doesn't have seronegative RA.  Ok to continue Relafen and tylenol PRN>         Relevant Orders   B. burgdorfi antibodies   Ambulatory referral to Rheumatology   Myofascial pain    Consider autoimmune d/o vs fibromyalgia.  Widespread Pain index score of 12 and Symptom Severity score of 10.  Dx consistent with possible fibromyalgia but need to rule out other dx first.  Discussed trial of muscle relaxer at bedtime.       Arthralgia of both hands    See note       Other Visit Diagnoses    Fatigue, unspecified type       Relevant Orders   B. burgdorfi antibodies      Fatigue - will check for lyme's disease.  Refer to Rheum to rule out RA and consider fibromyalgia.  Meds ordered this encounter  Medications  . cyclobenzaprine (FLEXERIL) 5 MG tablet    Sig: Take 1 tablet (5 mg total) by mouth  at bedtime as needed for muscle spasms.    Dispense:  30 tablet    Refill:  1    Follow-up: Return in about 4 weeks (around 11/30/2019).    Beatrice Lecher, MD

## 2019-11-02 NOTE — Assessment & Plan Note (Signed)
See note

## 2019-11-06 ENCOUNTER — Encounter: Payer: Self-pay | Admitting: Family Medicine

## 2019-11-06 DIAGNOSIS — M2550 Pain in unspecified joint: Secondary | ICD-10-CM | POA: Diagnosis not present

## 2019-11-06 DIAGNOSIS — R5383 Other fatigue: Secondary | ICD-10-CM | POA: Diagnosis not present

## 2019-11-07 LAB — B. BURGDORFI ANTIBODIES: B burgdorferi Ab IgG+IgM: <0.9 index

## 2019-11-08 DIAGNOSIS — R739 Hyperglycemia, unspecified: Secondary | ICD-10-CM | POA: Diagnosis not present

## 2019-11-08 DIAGNOSIS — E7849 Other hyperlipidemia: Secondary | ICD-10-CM | POA: Diagnosis not present

## 2019-11-08 DIAGNOSIS — E559 Vitamin D deficiency, unspecified: Secondary | ICD-10-CM | POA: Diagnosis not present

## 2019-11-08 NOTE — Telephone Encounter (Signed)
Sending referral to another provider to get patient seen sooner. - CF

## 2019-11-09 LAB — VITAMIN D 25 HYDROXY (VIT D DEFICIENCY, FRACTURES): Vit D, 25-Hydroxy: 58.1 ng/mL (ref 30.0–100.0)

## 2019-11-09 LAB — COMPREHENSIVE METABOLIC PANEL
ALT: 18 IU/L (ref 0–32)
AST: 18 IU/L (ref 0–40)
Albumin/Globulin Ratio: 1.7 (ref 1.2–2.2)
Albumin: 4.3 g/dL (ref 3.8–4.9)
Alkaline Phosphatase: 66 IU/L (ref 39–117)
BUN/Creatinine Ratio: 26 — ABNORMAL HIGH (ref 9–23)
BUN: 18 mg/dL (ref 6–24)
Bilirubin Total: 0.3 mg/dL (ref 0.0–1.2)
CO2: 25 mmol/L (ref 20–29)
Calcium: 9.4 mg/dL (ref 8.7–10.2)
Chloride: 100 mmol/L (ref 96–106)
Creatinine, Ser: 0.69 mg/dL (ref 0.57–1.00)
GFR calc Af Amer: 110 mL/min/{1.73_m2} (ref >59)
GFR calc non Af Amer: 96 mL/min/{1.73_m2} (ref >59)
Globulin, Total: 2.5 g/dL (ref 1.5–4.5)
Glucose: 84 mg/dL (ref 65–99)
Potassium: 4.1 mmol/L (ref 3.5–5.2)
Sodium: 139 mmol/L (ref 134–144)
Total Protein: 6.8 g/dL (ref 6.0–8.5)

## 2019-11-09 LAB — LIPID PANEL
Chol/HDL Ratio: 3.9 ratio (ref 0.0–4.4)
Cholesterol, Total: 201 mg/dL — ABNORMAL HIGH (ref 100–199)
HDL: 51 mg/dL (ref >39)
LDL Chol Calc (NIH): 132 mg/dL — ABNORMAL HIGH (ref 0–99)
Triglycerides: 100 mg/dL (ref 0–149)
VLDL Cholesterol Cal: 18 mg/dL (ref 5–40)

## 2019-11-09 LAB — HEMOGLOBIN A1C
Est. average glucose Bld gHb Est-mCnc: 103 mg/dL
Hgb A1c MFr Bld: 5.2 % (ref 4.8–5.6)

## 2019-11-09 LAB — INSULIN, RANDOM: INSULIN: 6.1 u[IU]/mL (ref 2.6–24.9)

## 2019-11-10 MED FILL — LOSARTAN POTASSIUM 25 MG TA: 25 | 90 days supply | Qty: 90 | Fill #0

## 2019-11-10 MED FILL — NABUMETONE 500 MG TABS: 500 | 30 days supply | Qty: 30 | Fill #0

## 2019-11-10 MED FILL — DOTTI 0.075 MG/24HR PTTW: 0.075 | 84 days supply | Qty: 24 | Fill #0

## 2019-11-20 ENCOUNTER — Ambulatory Visit (INDEPENDENT_AMBULATORY_CARE_PROVIDER_SITE_OTHER): Payer: 59 | Admitting: Physician Assistant

## 2019-11-20 ENCOUNTER — Other Ambulatory Visit: Payer: Self-pay

## 2019-11-20 ENCOUNTER — Encounter (INDEPENDENT_AMBULATORY_CARE_PROVIDER_SITE_OTHER): Payer: Self-pay | Admitting: Physician Assistant

## 2019-11-20 VITALS — BP 105/73 | HR 78 | Temp 98.3°F | Ht 67.0 in | Wt 233.0 lb

## 2019-11-20 DIAGNOSIS — F3289 Other specified depressive episodes: Secondary | ICD-10-CM | POA: Diagnosis not present

## 2019-11-20 DIAGNOSIS — Z6836 Body mass index (BMI) 36.0-36.9, adult: Secondary | ICD-10-CM

## 2019-11-20 DIAGNOSIS — Z9189 Other specified personal risk factors, not elsewhere classified: Secondary | ICD-10-CM

## 2019-11-20 DIAGNOSIS — E559 Vitamin D deficiency, unspecified: Secondary | ICD-10-CM | POA: Diagnosis not present

## 2019-11-20 MED ORDER — BUPROPION HCL ER (SR) 150 MG PO TB12: 150.0000 mg | ORAL_TABLET | Freq: Every day | ORAL | 0 refills | Status: DC

## 2019-11-21 NOTE — Progress Notes (Signed)
Chief Complaint:   Kathy Clark is here to discuss her progress with her obesity treatment plan along with follow-up of her obesity related diagnoses. Kathy Clark is following a lower carbohydrate, vegetable and lean protein rich diet plan and states she is following her eating plan approximately 95% of the time. [employer] states she is walking/personal trainer 30 minutes 4-5 times per week.  Today's visit was #: 14 Starting weight: 237 lbs Starting date: 01/22/20108 Today's weight: 233 lbs Today's date: 11/20/2019 Total lbs lost to date: 4 Total lbs lost since last in-office visit: 4  Interim History: Kathy Clark reports that she is enjoying the low carb plan. Her cravings have drastically decreased. She has an appointment with Rheumatology coming up.  Subjective:   Other depression with emotional eating. Kathy Clark is struggling with emotional eating and using food for comfort to the extent that it is negatively impacting her health. She has been working on behavior modification techniques to help reduce her emotional eating and has been somewhat successful. She shows no sign of suicidal or homicidal ideations. Kathy Clark is on Wellbutrin. No cravings.  Vitamin D deficiency. Kathy Clark is on Vitamin weekly. No nausea, vomiting, or muscle weakness. Last Vitamin D 58.1 on 11/08/2019.  At risk for osteoporosis. Kathy Clark is at higher risk of osteopenia and osteoporosis due to Vitamin D deficiency.   Assessment/Plan:   Other depression with emotional eating.  Behavior modification techniques were discussed today to help [name] deal with her emotional/non-hunger eating behaviors.  Orders and follow up as documented in patient record. Refill was given for buPROPion (WELLBUTRIN SR) 150 MG 12 hr tablet #30 with 0 refills.  Vitamin D deficiency. Low Vitamin D level contributes to fatigue and are associated with obesity, breast, and colon cancer. She will change to OTC Vitamin D 4,000 units daily and will  follow-up for routine testing of Vitamin D, at least 2-3 times per year to avoid over-replacement.  At risk for osteoporosis. Kathy Clark was given approximately 15 minutes of osteoporosis prevention counseling today. Kathy Clark is at risk for osteopenia and osteoporosis due to her Vitamin D deficiency. She was encouraged to take her Vitamin D and follow her higher calcium diet and increase strengthening exercise to help strengthen her bones and decrease her risk of osteopenia and osteoporosis.  Repetitive spaced learning was employed today to elicit superior memory formation and behavioral change.  Class 2 severe obesity with serious comorbidity and body mass index (BMI) of 36.0 to 36.9 in adult, unspecified obesity type ([place]).  Kathy Clark is currently in the action stage of change. As such, her goal is to continue with weight loss efforts. She has agreed to following a lower carbohydrate, vegetable and lean protein rich diet plan.   Exercise goals: For substantial health benefits, adults should do at least 150 minutes (2 hours and 30 minutes) a week of moderate-intensity, or 75 minutes (1 hour and 15 minutes) a week of vigorous-intensity aerobic physical activity, or an equivalent combination of moderate- and vigorous-intensity aerobic activity. Aerobic activity should be performed in episodes of at least 10 minutes, and preferably, it should be spread throughout the week.  Behavioral modification strategies: avoiding temptations and planning for success.  Kathy Clark has agreed to follow-up with our clinic in 3-4 weeks. She was informed of the importance of frequent follow-up visits to maximize her success with intensive lifestyle modifications for her multiple health conditions.   Objective:   Blood pressure 105/73, pulse 78, temperature 98.3 F (36.8 C), temperature source  Oral, height 5\' 7"  (1.702 m), weight 233 lb (105.7 kg), SpO2 96 %. Body mass index is 36.49 kg/m.  General: Cooperative, alert, well  developed, in no acute distress. HEENT: Conjunctivae and lids unremarkable. Cardiovascular: Regular rhythm.  Lungs: Normal work of breathing. Neurologic: No focal deficits.   Lab Results  Component Value Date   CREATININE 0.69 11/08/2019   BUN 18 11/08/2019   NA 139 11/08/2019   K 4.1 11/08/2019   CL 100 11/08/2019   CO2 25 11/08/2019   Lab Results  Component Value Date   ALT 18 11/08/2019   AST 18 11/08/2019   ALKPHOS 66 11/08/2019   BILITOT 0.3 11/08/2019   Lab Results  Component Value Date   HGBA1C 5.2 11/08/2019   HGBA1C 5.1 02/14/2019   HGBA1C 5.1 12/01/2016   HGBA1C 4.9 08/03/2016   Lab Results  Component Value Date   INSULIN 6.1 11/08/2019   INSULIN 5.4 12/01/2016   INSULIN 5.9 08/03/2016   Lab Results  Component Value Date   TSH 2.61 10/03/2019   Lab Results  Component Value Date   CHOL 201 (H) 11/08/2019   HDL 51 11/08/2019   LDLCALC 132 (H) 11/08/2019   TRIG 100 11/08/2019   CHOLHDL 3.9 11/08/2019   Lab Results  Component Value Date   WBC 7.1 10/03/2019   HGB 13.7 10/03/2019   HCT 40.9 10/03/2019   MCV 89.5 10/03/2019   PLT 343 10/03/2019   Lab Results  Component Value Date   FERRITIN 142 12/25/2015   Attestation Statements:   Reviewed by clinician on day of visit: allergies, medications, problem list, medical history, surgical history, family history, social history, and previous encounter notes.  IMichaelene Song, am acting as transcriptionist for Abby Potash, PA-C   I have reviewed the above documentation for accuracy and completeness, and I agree with the above. Abby Potash, PA-C

## 2019-11-28 DIAGNOSIS — M0609 Rheumatoid arthritis without rheumatoid factor, multiple sites: Secondary | ICD-10-CM | POA: Diagnosis not present

## 2019-11-28 DIAGNOSIS — Z79899 Other long term (current) drug therapy: Secondary | ICD-10-CM | POA: Diagnosis not present

## 2019-11-28 MED FILL — predniSONE 5 MG TABS: 5 | 30 days supply | Qty: 70 | Fill #0

## 2019-11-29 ENCOUNTER — Other Ambulatory Visit (HOSPITAL_COMMUNITY): Payer: Self-pay | Admitting: Rheumatology

## 2019-11-29 MED FILL — FOLIC ACID 1 MG TABS: 1 | 90 days supply | Qty: 90 | Fill #0

## 2019-11-29 MED FILL — METHOTREXATE 2.5 MG TAB: 2.5 | 28 days supply | Qty: 24 | Fill #0

## 2019-11-30 ENCOUNTER — Other Ambulatory Visit (INDEPENDENT_AMBULATORY_CARE_PROVIDER_SITE_OTHER): Payer: Self-pay | Admitting: Physician Assistant

## 2019-11-30 DIAGNOSIS — F3289 Other specified depressive episodes: Secondary | ICD-10-CM

## 2019-12-02 ENCOUNTER — Telehealth: Payer: 59 | Admitting: Nurse Practitioner

## 2019-12-02 DIAGNOSIS — R110 Nausea: Secondary | ICD-10-CM

## 2019-12-02 MED ORDER — ONDANSETRON HCL 4 MG PO TABS: 4.0000 mg | ORAL_TABLET | Freq: Three times a day (TID) | ORAL | 0 refills | Status: DC | PRN

## 2019-12-02 MED FILL — BUPROPION HCL SR 150 MG TAB: 150 | 30 days supply | Qty: 30 | Fill #0

## 2019-12-02 NOTE — Progress Notes (Signed)
We are sorry that you are not feeling well. Here is how we plan to help!  Based on what you have shared with me it looks like you have a Virus that is irritating your GI tract.  Vomiting is the forceful emptying of a portion of the stomach's content through the mouth.  Although nausea and vomiting can make you feel miserable, it's important to remember that these are not diseases, but rather symptoms of an underlying illness.  When we treat short term symptoms, we always caution that any symptoms that persist should be fully evaluated in a medical office.  I have prescribed a medication that will help alleviate your symptoms and allow you to stay hydrated:  Zofran 4 mg 1 tablet every 8 hours as needed for nausea and vomiting  HOME CARE:  Drink clear liquids.  This is very important! Dehydration (the lack of fluid) can lead to a serious complication.  Start off with 1 tablespoon every 5 minutes for 8 hours.  You may begin eating bland foods after 8 hours without vomiting.  Start with saltine crackers, white bread, rice, mashed potatoes, applesauce.  After 48 hours on a bland diet, you may resume a normal diet.  Try to go to sleep.  Sleep often empties the stomach and relieves the need to vomit.  GET HELP RIGHT AWAY IF:   Your symptoms do not improve or worsen within 2 days after treatment.  You have a fever for over 3 days.  You cannot keep down fluids after trying the medication.  MAKE SURE YOU:   Understand these instructions.  Will watch your condition.  Will get help right away if you are not doing well or get worse.   Thank you for choosing an e-visit. Your e-visit answers were reviewed by a board certified advanced clinical practitioner to complete your personal care plan. Depending upon the condition, your plan could have included both over the counter or prescription medications. Please review your pharmacy choice. Be sure that the pharmacy you have chosen is open so  that you can pick up your prescription now.  If there is a problem you may message your provider in MyChart to have the prescription routed to another pharmacy. Your safety is important to us. If you have drug allergies check your prescription carefully.  For the next 24 hours, you can use MyChart to ask questions about today's visit, request a non-urgent call back, or ask for a work or school excuse from your e-visit provider. You will get an e-mail in the next two days asking about your experience. I hope that your e-visit has been valuable and will speed your recovery.  5-10 minutes spent reviewing and documenting in chart.   

## 2019-12-04 ENCOUNTER — Encounter: Payer: Self-pay | Admitting: Family Medicine

## 2019-12-04 ENCOUNTER — Encounter (INDEPENDENT_AMBULATORY_CARE_PROVIDER_SITE_OTHER): Payer: Self-pay | Admitting: Physician Assistant

## 2019-12-04 ENCOUNTER — Ambulatory Visit (INDEPENDENT_AMBULATORY_CARE_PROVIDER_SITE_OTHER): Payer: 59 | Admitting: Family Medicine

## 2019-12-04 VITALS — BP 124/77 | HR 81 | Ht 67.0 in | Wt 233.0 lb

## 2019-12-04 DIAGNOSIS — M7918 Myalgia, other site: Secondary | ICD-10-CM | POA: Diagnosis not present

## 2019-12-04 DIAGNOSIS — M0609 Rheumatoid arthritis without rheumatoid factor, multiple sites: Secondary | ICD-10-CM

## 2019-12-04 MED ORDER — NABUMETONE 500 MG PO TABS: 500.0000 mg | ORAL_TABLET | Freq: Every day | ORAL | 3 refills | Status: DC | PRN

## 2019-12-04 MED ORDER — CYCLOBENZAPRINE HCL 5 MG PO TABS: 5.0000 mg | ORAL_TABLET | Freq: Every evening | ORAL | 3 refills | Status: DC | PRN

## 2019-12-04 MED FILL — NABUMETONE 500 MG TABS: 500 | 30 days supply | Qty: 30 | Fill #0

## 2019-12-04 MED FILL — CYCLOBENZAPRINE HCL 5 MG TA: 5 | 30 days supply | Qty: 30 | Fill #0

## 2019-12-04 NOTE — Assessment & Plan Note (Signed)
10 you with Flexeril and as needed Relafen.  Recommend holding the Relafen until done with the prednisone.  Okay to use Tylenol in the interim.

## 2019-12-04 NOTE — Assessment & Plan Note (Signed)
New diagnosis.  Following with Dr. Posey Pronto at [facility] rheumatology.  Recently started on prednisone and methotrexate.  Recommend adding a PPI to counteract any nausea or gastritis that could be occurring.  If having persistent nausea over the next 2 to 3 weeks then consider reaching back out to Dr. Posey Pronto to let him know.  There are other options to consider besides methotrexate if not well-tolerated.

## 2019-12-04 NOTE — Progress Notes (Signed)
Established Patient Office Visit  Subjective:  Patient ID: Kathy Clark, female    DOB: 1961/04/11  Age: 59 y.o. MRN: [id number]  CC:  Chief Complaint  Patient presents with  . Follow-up    polyarthralgia    HPI DALAYAH STALLING presents for f/u joint pain.  Referred her to Rheumatology and they placed her on steroids and started her on Methotrexate.  She says she feels 50% better since being on the steroid.  She says a lot of the swelling in her hands is gone down since being on the prednisone.  She has had a lot of nausea since taking the methotrexate this weekend and wonders if that would be something that is persistent.  She was said is it was bad enough that she actually did a virtual visit and got some nausea medication to use.  She is still taking OsteoBioflex.  He is about to run out and wants to know if she should consider continuing it or not.  She reports that the Flexeril and Relafen have actually been really helpful.  She says the Flexeril she takes early enough in the evening really helps her get a good night sleep.  Wanted to know if I will be willing to take over her gabapentin in the future she takes 3 mg twice a day.  It was previously being filled by her  Past Medical History:  Diagnosis Date  . Anxiety   . Back pain   . 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 Depression   . Essential hypertension, benign   . Gastritis   . GERD (gastroesophageal reflux disease)   . Hypertension   . Joint pain   . Lymphocytic colitis    microscopic  . Migraines   . Osteoarthritis   . Sciatica    right leg  . Swelling    feet, left foot    Past Surgical History:  Procedure Laterality Date  . ABDOMINAL HYSTERECTOMY  07-2005  . [place] Bunionectomy Left 09/18/2016   Left Foot  . BACK SURGERY    . KNEE SURGERY  2006   left; multiple  . microdiscetomy  2005  . SHOULDER SURGERY  07-22-09   right  . SKIN CANCER EXCISION     eyelid  . TOTAL  KNEE ARTHROPLASTY  07/27/2011   Procedure: TOTAL KNEE ARTHROPLASTY;  Surgeon: Gearlean Alf;  Location: [facility];  Service: Orthopedics;  Laterality: Left;    Family History  Problem Relation Age of Onset  . Breast cancer Unknown   . Tongue cancer Mother   . Breast cancer Mother   . Anxiety disorder Mother   . Depression Mother   . Heart disease Father   . Hypertension Father   . Hyperlipidemia Father   . Stroke Paternal Grandmother   . Colon cancer Neg Hx     Social History   Socioeconomic History  . Marital status: Divorced    Spouse name: Jenny Reichmann  . Number of children: 2  . Years of education: Not on file  . Highest education level: Not on file  Occupational History  . Occupation: Programmer, multimedia: [employer]  Tobacco Use  . Smoking status: Never Smoker  . Smokeless tobacco: Never Used  Substance and Sexual Activity  . Alcohol use: Yes    Comment: occasionally  . Drug use: No  . 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  Other Topics Concern  . Not on file  Social History Narrative  . Not on file   Social Determinants of Health   Financial Resource Strain:   . Difficulty of Paying Living Expenses:   Food Insecurity:   . Worried About Charity fundraiser in the Last Year:   . Arboriculturist in the Last Year:   Transportation Needs:   . Film/video editor (Medical):   [name] Lack of Transportation (Non-Medical):   Physical Activity:   . Days of Exercise per Week:   . Minutes of Exercise per Session:   Stress:   . Feeling of Stress :   Social Connections:   . Frequency of Communication with Friends and Family:   . Frequency of Social Gatherings with Friends and Family:   . Attends Religious Services:   . Active Member of Clubs or Organizations:   . Attends Archivist Meetings:   [name] Marital Status:   Intimate Partner Violence:   . Fear of Current or Ex-Partner:   . Emotionally Abused:   [name]  Physically Abused:   . Sexually Abused:     Outpatient Medications Prior to Visit  Medication Sig Dispense Refill  . AMBULATORY NON FORMULARY MEDICATION Medication Name: Please decrease CPAP setting to 8 cm of water pressure and fax [place] a download after 5 days.  Is having a lot of difficulty with air leaks is working to try to reduce her pressure to see if she is still being adequately treated but with fewer leaks.FAx to [place] 1 Units 0  . buPROPion (WELLBUTRIN SR) 150 MG 12 hr tablet Take 1 tablet (150 mg total) by mouth daily. 30 tablet 0  . Calcium Carbonate-Vitamin D (CALTRATE 600+D PO) Take 1 tablet by mouth daily.    [name] EPINEPHrine 0.3 mg/0.3 mL IJ SOAJ injection Inject 0.3 mLs (0.3 mg total) into the muscle as needed for anaphylaxis. 1 Device 2  . estradiol (VIVELLE-DOT) 0.075 [id number]   3  . folic acid (FOLVITE) 1 MG tablet Take 1 mg by mouth daily.    [name] gabapentin (NEURONTIN) 300 MG capsule 2 caps p.o. twice daily for a week then increase dose as needed up to a maximum of 3600 mg daily (Patient taking differently: 300 mg 2 (two) times daily. 1 capsule TWICE DAILY) 180 capsule 3  . hydrochlorothiazide (HYDRODIURIL) 25 MG tablet Take 1 tablet (25 mg total) by mouth daily. 90 tablet 1  . losartan (COZAAR) 25 MG tablet TAKE 1 TABLET BY MOUTH DAILY. 90 tablet 2  . Melatonin 3 MG TABS Take by mouth as directed.    . methotrexate (RHEUMATREX) 2.5 MG tablet Take 6 tablets by mouth once a week.    . Misc Natural Products (OSTEO BI-FLEX ADV TRIPLE ST PO) Take by mouth.    . Multiple Vitamins-Minerals (MULTIVITAMIN ADULT PO) Take by mouth daily.    . ondansetron (ZOFRAN) 4 MG tablet Take 1 tablet (4 mg total) by mouth every 8 (eight) hours as needed for nausea or vomiting. 20 tablet 0  . predniSONE (DELTASONE) 5 MG tablet TAKE 4 TABS IN THE AM FOR 1 WEEK, 3 TABS IN THE AM FOR 1 WEEK, 2 TABS IN THE AM FOR 1 WEEK, THEN 1 TAB IN THE AM FOR 1 WEEK    . SUMAtriptan (IMITREX) 20 MG/ACT nasal  spray PLACE 1 SPRAY INTO THE NOSE EVERY 2 HOURS AS NEEDED FOR. HEADACHE 6 Inhaler 2  . Vilazodone HCl (VIIBRYD) 40  MG TABS Take 1 tablet (40 mg total) by mouth daily. 90 tablet 1  . cyclobenzaprine (FLEXERIL) 5 MG tablet Take 1 tablet (5 mg total) by mouth at bedtime as needed for muscle spasms. 30 tablet 1  . nabumetone (RELAFEN) 500 MG tablet Take 1 tablet (500 mg total) by mouth daily as needed for moderate pain. 30 tablet 1   No facility-administered medications prior to visit.    Allergies  Allergen Reactions  . Succinylcholine Anaphylaxis  . Dilaudid [Hydromorphone Hcl] Nausea And Vomiting  . Nsaids Other (See Comments)    Trigger colits    ROS Review of Systems    Objective:    Physical Exam  BP 124/77   Pulse 81   Ht 5\' 7"  (1.702 m)   Wt 233 lb (105.7 kg)   LMP  (LMP Unknown) Comment: hystrectomy  SpO2 99%   BMI 36.49 kg/m  Wt Readings from Last 3 Encounters:  12/04/19 233 lb (105.7 kg)  11/20/19 233 lb (105.7 kg)  11/02/19 242 lb (109.8 kg)     Health Maintenance Due  Topic Date Due  . MAMMOGRAM  09/22/2019    There are no preventive care reminders to display for this patient.  Lab Results  Component Value Date   TSH 2.61 10/03/2019   Lab Results  Component Value Date   WBC 7.1 10/03/2019   HGB 13.7 10/03/2019   HCT 40.9 10/03/2019   MCV 89.5 10/03/2019   PLT 343 10/03/2019   Lab Results  Component Value Date   NA 139 11/08/2019   K 4.1 11/08/2019   CO2 25 11/08/2019   GLUCOSE 84 11/08/2019   BUN 18 11/08/2019   CREATININE 0.69 11/08/2019   BILITOT 0.3 11/08/2019   ALKPHOS 66 11/08/2019   AST 18 11/08/2019   ALT 18 11/08/2019   PROT 6.8 11/08/2019   ALBUMIN 4.3 11/08/2019   CALCIUM 9.4 11/08/2019   Lab Results  Component Value Date   CHOL 201 (H) 11/08/2019   Lab Results  Component Value Date   HDL 51 11/08/2019   Lab Results  Component Value Date   LDLCALC 132 (H) 11/08/2019   Lab Results  Component Value Date   TRIG  100 11/08/2019   Lab Results  Component Value Date   CHOLHDL 3.9 11/08/2019   Lab Results  Component Value Date   HGBA1C 5.2 11/08/2019      Assessment & Plan:   Problem List Items Addressed This Visit      Musculoskeletal and Integument   Rheumatoid arthritis of multiple sites with negative rheumatoid factor ([place])    New diagnosis.  Following with Dr. Posey Pronto at [facility] rheumatology.  Recently started on prednisone and methotrexate.  Recommend adding a PPI to counteract any nausea or gastritis that could be occurring.  If having persistent nausea over the next 2 to 3 weeks then consider reaching back out to Dr. Posey Pronto to let him know.  There are other options to consider besides methotrexate if not well-tolerated.      Relevant Medications   methotrexate (RHEUMATREX) 2.5 MG tablet   predniSONE (DELTASONE) 5 MG tablet   cyclobenzaprine (FLEXERIL) 5 MG tablet   nabumetone (RELAFEN) 500 MG tablet     Other   Myofascial pain - Primary    10 you with Flexeril and as needed Relafen.  Recommend holding the Relafen until done with the prednisone.  Okay to use Tylenol in the interim.         Meds ordered  this encounter  Medications  . cyclobenzaprine (FLEXERIL) 5 MG tablet    Sig: Take 1 tablet (5 mg total) by mouth at bedtime as needed for muscle spasms.    Dispense:  30 tablet    Refill:  3  . nabumetone (RELAFEN) 500 MG tablet    Sig: Take 1 tablet (500 mg total) by mouth daily as needed for moderate pain.    Dispense:  30 tablet    Refill:  3    Follow-up: Return if symptoms worsen or fail to improve.    Beatrice Lecher, MD

## 2019-12-05 ENCOUNTER — Encounter (INDEPENDENT_AMBULATORY_CARE_PROVIDER_SITE_OTHER): Payer: Self-pay | Admitting: Physician Assistant

## 2019-12-05 NOTE — Telephone Encounter (Signed)
Please review

## 2019-12-14 ENCOUNTER — Ambulatory Visit (INDEPENDENT_AMBULATORY_CARE_PROVIDER_SITE_OTHER): Payer: 59 | Admitting: Physician Assistant

## 2019-12-14 ENCOUNTER — Other Ambulatory Visit: Payer: Self-pay

## 2019-12-14 ENCOUNTER — Encounter (INDEPENDENT_AMBULATORY_CARE_PROVIDER_SITE_OTHER): Payer: Self-pay | Admitting: Physician Assistant

## 2019-12-14 VITALS — BP 118/80 | HR 71 | Temp 97.7°F | Ht 67.0 in | Wt 228.0 lb

## 2019-12-14 DIAGNOSIS — Z9189 Other specified personal risk factors, not elsewhere classified: Secondary | ICD-10-CM

## 2019-12-14 DIAGNOSIS — E7849 Other hyperlipidemia: Secondary | ICD-10-CM

## 2019-12-14 DIAGNOSIS — Z6835 Body mass index (BMI) 35.0-35.9, adult: Secondary | ICD-10-CM

## 2019-12-14 DIAGNOSIS — F3289 Other specified depressive episodes: Secondary | ICD-10-CM | POA: Diagnosis not present

## 2019-12-14 DIAGNOSIS — E66812 Obesity, class 2: Secondary | ICD-10-CM

## 2019-12-14 MED ORDER — BUPROPION HCL ER (SR) 150 MG PO TB12: 150.0000 mg | ORAL_TABLET | Freq: Every day | ORAL | 0 refills | Status: DC

## 2019-12-14 NOTE — Progress Notes (Signed)
Chief Complaint:   Woodworth is here to discuss her progress with her obesity treatment plan along with follow-up of her obesity related diagnoses. Yaniah is following a lower carbohydrate, vegetable and lean protein rich diet plan and states she is following her eating plan approximately 90% of the time. Marquis states she has had decreased physical activity.   Today's visit was #: 18 Starting weight: 237 lbs Starting date: 08/03/2016 Today's weight: 228 lbs Today's date: 12/14/2019 Total lbs lost to date: 9 Total lbs lost since last in-office visit: 5  Interim History: Quantavia states that she has been in a lot of discomfort due to her RA. She is on methotrexate and prednisone. She is enjoying the low carb meal plan and wants to continue.  Subjective:   Other depression with emotional eating. Nephateria is struggling with emotional eating and using food for comfort to the extent that it is negatively impacting her health. She has been working on behavior modification techniques to help reduce her emotional eating and has been somewhat successful. She shows no sign of suicidal or homicidal ideations. Arica is on bupropion. Blood pressure is normal today.  Other hyperlipidemia. Charisma is on no medication. No chest pain.   Lab Results  Component Value Date   CHOL 201 (H) 11/08/2019   HDL 51 11/08/2019   LDLCALC 132 (H) 11/08/2019   TRIG 100 11/08/2019   CHOLHDL 3.9 11/08/2019   Lab Results  Component Value Date   ALT 18 11/08/2019   AST 18 11/08/2019   ALKPHOS 66 11/08/2019   BILITOT 0.3 11/08/2019   The 10-year ASCVD risk score [name] DC Jr., et al., 2013) is: 3.6%   Values used to calculate the score:     Age: 59 years     Sex: Female     Is Non-Hispanic African American: No     Diabetic: No     Tobacco smoker: No     Systolic Blood Pressure: 123456 mmHg     Is BP treated: Yes     HDL Cholesterol: 51 mg/dL     Total Cholesterol: 201 mg/dL  At risk for heart  disease. Jeani is at a higher than average risk for cardiovascular disease due to obesity.   Assessment/Plan:   Other depression with emotional eating. Behavior modification techniques were discussed today to help Clarabelle deal with her emotional/non-hunger eating behaviors.  Orders and follow up as documented in patient record. Refill was given for buPROPion (WELLBUTRIN SR) 150 MG 12 hr tablet #30 with 0 refills.  Other hyperlipidemia. Cardiovascular risk and specific lipid/LDL goals reviewed.  We discussed several lifestyle modifications today and Tonasia will continue to work on diet, exercise and weight loss efforts. Orders and follow up as documented in patient record.   Counseling Intensive lifestyle modifications are the first line treatment for this issue. . Dietary changes: Increase soluble fiber. Decrease simple carbohydrates. . Exercise changes: Moderate to vigorous-intensity aerobic activity 150 minutes per week if tolerated. . Lipid-lowering medications: see documented in medical record.  At risk for heart disease. Delynn was given approximately 15 minutes of coronary artery disease prevention counseling today. She is 59 y.o. female and has risk factors for heart disease including obesity. We discussed intensive lifestyle modifications today with an emphasis on specific weight loss instructions and strategies.   Repetitive spaced learning was employed today to elicit superior memory formation and behavioral change.  Class 2 severe obesity with serious comorbidity and body mass index (  BMI) of 35.0 to 35.9 in adult, unspecified obesity type ([place]).  Zhara is currently in the action stage of change. As such, her goal is to continue with weight loss efforts. She has agreed to following a lower carbohydrate, vegetable and lean protein rich diet plan.   Exercise goals: For substantial health benefits, adults should do at least 150 minutes (2 hours and 30 minutes) a week of moderate-intensity,  or 75 minutes (1 hour and 15 minutes) a week of vigorous-intensity aerobic physical activity, or an equivalent combination of moderate- and vigorous-intensity aerobic activity. Aerobic activity should be performed in episodes of at least 10 minutes, and preferably, it should be spread throughout the week.  Behavioral modification strategies: meal planning and cooking strategies and keeping healthy foods in the home.  Filicia has agreed to follow-up with our clinic in 3-4 weeks. She was informed of the importance of frequent follow-up visits to maximize her success with intensive lifestyle modifications for her multiple health conditions.   Objective:   Blood pressure 118/80, pulse 71, temperature 97.7 F (36.5 C), temperature source Oral, height 5\' 7"  (1.702 m), weight 228 lb (103.4 kg), SpO2 98 %. Body mass index is 35.71 kg/m.  General: Cooperative, alert, well developed, in no acute distress. HEENT: Conjunctivae and lids unremarkable. Cardiovascular: Regular rhythm.  Lungs: Normal work of breathing. Neurologic: No focal deficits.   Lab Results  Component Value Date   CREATININE 0.69 11/08/2019   BUN 18 11/08/2019   NA 139 11/08/2019   K 4.1 11/08/2019   CL 100 11/08/2019   CO2 25 11/08/2019   Lab Results  Component Value Date   ALT 18 11/08/2019   AST 18 11/08/2019   ALKPHOS 66 11/08/2019   BILITOT 0.3 11/08/2019   Lab Results  Component Value Date   HGBA1C 5.2 11/08/2019   HGBA1C 5.1 02/14/2019   HGBA1C 5.1 12/01/2016   HGBA1C 4.9 08/03/2016   Lab Results  Component Value Date   INSULIN 6.1 11/08/2019   INSULIN 5.4 12/01/2016   INSULIN 5.9 08/03/2016   Lab Results  Component Value Date   TSH 2.61 10/03/2019   Lab Results  Component Value Date   CHOL 201 (H) 11/08/2019   HDL 51 11/08/2019   LDLCALC 132 (H) 11/08/2019   TRIG 100 11/08/2019   CHOLHDL 3.9 11/08/2019   Lab Results  Component Value Date   WBC 7.1 10/03/2019   HGB 13.7 10/03/2019   HCT 40.9  10/03/2019   MCV 89.5 10/03/2019   PLT 343 10/03/2019   Lab Results  Component Value Date   FERRITIN 142 12/25/2015   Attestation Statements:   Reviewed by clinician on day of visit: allergies, medications, problem list, medical history, surgical history, family history, social history, and previous encounter notes.  IMichaelene Song, am acting as transcriptionist for Abby Potash, PA-C   I have reviewed the above documentation for accuracy and completeness, and I agree with the above. Abby Potash, PA-C

## 2019-12-15 ENCOUNTER — Encounter: Payer: Self-pay | Admitting: Family Medicine

## 2019-12-15 MED FILL — GABAPENTIN 300 MG CAPSULE: 300 | 90 days supply | Qty: 180 | Fill #1

## 2019-12-18 ENCOUNTER — Encounter: Payer: Self-pay | Admitting: Family Medicine

## 2019-12-25 MED FILL — METHOTREXATE 2.5 MG TAB: 2.5 | 28 days supply | Qty: 24 | Fill #1

## 2019-12-28 ENCOUNTER — Encounter: Payer: Self-pay | Admitting: Family Medicine

## 2019-12-29 MED ORDER — EPINEPHRINE 0.3 MG/0.3ML IJ SOAJ: 0.3000 mg | INTRAMUSCULAR | 99 refills | Status: DC | PRN

## 2019-12-29 MED FILL — EPINEPHRINE 0.3 MG AUTO-INJ: 0.3 | 2 days supply | Qty: 2 | Fill #0

## 2020-01-04 ENCOUNTER — Telehealth: Payer: Self-pay | Admitting: Family Medicine

## 2020-01-04 NOTE — Telephone Encounter (Signed)
Patient wanted to know if the phsyical can be with anyone else since provider will be out office and needs to be before 7/1.

## 2020-01-05 NOTE — Telephone Encounter (Signed)
Yes ok to put with SB or jOy

## 2020-01-05 NOTE — Telephone Encounter (Signed)
Appointment has been made. No further questions at this time.  

## 2020-01-08 DIAGNOSIS — M0609 Rheumatoid arthritis without rheumatoid factor, multiple sites: Secondary | ICD-10-CM | POA: Diagnosis not present

## 2020-01-08 DIAGNOSIS — Z79899 Other long term (current) drug therapy: Secondary | ICD-10-CM | POA: Diagnosis not present

## 2020-01-08 MED FILL — VIIBRYD 40 MG TABLET: 40 | 90 days supply | Qty: 90 | Fill #1

## 2020-01-08 MED FILL — CYCLOBENZAPRINE HCL 5 MG TA: 5 | 30 days supply | Qty: 30 | Fill #1

## 2020-01-10 ENCOUNTER — Encounter: Payer: Self-pay | Admitting: Medical-Surgical

## 2020-01-10 ENCOUNTER — Ambulatory Visit (INDEPENDENT_AMBULATORY_CARE_PROVIDER_SITE_OTHER): Payer: 59 | Admitting: Medical-Surgical

## 2020-01-10 ENCOUNTER — Other Ambulatory Visit: Payer: Self-pay

## 2020-01-10 VITALS — BP 114/77 | HR 74 | Temp 97.7°F | Ht 68.0 in | Wt 231.4 lb

## 2020-01-10 DIAGNOSIS — G479 Sleep disorder, unspecified: Secondary | ICD-10-CM | POA: Diagnosis not present

## 2020-01-10 DIAGNOSIS — Z0000 Encounter for general adult medical examination without abnormal findings: Secondary | ICD-10-CM

## 2020-01-10 DIAGNOSIS — Z7689 Persons encountering health services in other specified circumstances: Secondary | ICD-10-CM | POA: Insufficient documentation

## 2020-01-10 NOTE — Patient Instructions (Signed)
Preventive Care 40-60 Years Old, Female °Preventive care refers to visits with your health care provider and lifestyle choices that can promote health and wellness. This includes: °· A yearly physical exam. This may also be called an annual well check. °· Regular dental visits and eye exams. °· Immunizations. °· Screening for certain conditions. °· Healthy lifestyle choices, such as eating a healthy diet, getting regular exercise, not using drugs or products that contain nicotine and tobacco, and limiting alcohol use. °What can I expect for my preventive care visit? °Physical exam °Your health care provider will check your: °· Height and weight. This may be used to calculate body mass index (BMI), which tells if you are at a healthy weight. °· Heart rate and blood pressure. °· Skin for abnormal spots. °Counseling °Your health care provider may ask you questions about your: °· Alcohol, tobacco, and drug use. °· Emotional well-being. °· Home and relationship well-being. °· Sexual activity. °· Eating habits. °· Work and work environment. °· Method of birth control. °· Menstrual cycle. °· Pregnancy history. °What immunizations do I need? ° °Influenza (flu) vaccine °· This is recommended every year. °Tetanus, diphtheria, and pertussis (Tdap) vaccine °· You may need a Td booster every 10 years. °Varicella (chickenpox) vaccine °· You may need this if you have not been vaccinated. °Zoster (shingles) vaccine °· You may need this after age 60. °Measles, mumps, and rubella (MMR) vaccine °· You may need at least one dose of MMR if you were born in 1957 or later. You may also need a second dose. °Pneumococcal conjugate (PCV13) vaccine °· You may need this if you have certain conditions and were not previously vaccinated. °Pneumococcal polysaccharide (PPSV23) vaccine °· You may need one or two doses if you smoke cigarettes or if you have certain conditions. °Meningococcal conjugate (MenACWY) vaccine °· You may need this if you  have certain conditions. °Hepatitis A vaccine °· You may need this if you have certain conditions or if you travel or work in places where you may be exposed to hepatitis A. °Hepatitis B vaccine °· You may need this if you have certain conditions or if you travel or work in places where you may be exposed to hepatitis B. °Haemophilus influenzae type b (Hib) vaccine °· You may need this if you have certain conditions. °Human papillomavirus (HPV) vaccine °· If recommended by your health care provider, you may need three doses over 6 months. °You may receive vaccines as individual doses or as more than one vaccine together in one shot (combination vaccines). Talk with your health care provider about the risks and benefits of combination vaccines. °What tests do I need? °Blood tests °· Lipid and cholesterol levels. These may be checked every 5 years, or more frequently if you are over 60 years old. °· Hepatitis C test. °· Hepatitis B test. °Screening °· Lung cancer screening. You may have this screening every year starting at age 60 if you have a 30-pack-year history of smoking and currently smoke or have quit within the past 15 years. °· Colorectal cancer screening. All adults should have this screening starting at age 60 and continuing until age 75. Your health care provider may recommend screening at age 60 if you are at increased risk. You will have tests every 1-10 years, depending on your results and the type of screening test. °· Diabetes screening. This is done by checking your blood sugar (glucose) after you have not eaten for a while (fasting). You may have this   done every 1-3 years.  Mammogram. This may be done every 1-2 years. Talk with your health care provider about when you should start having regular mammograms. This may depend on whether you have a family history of breast cancer.  BRCA-related cancer screening. This may be done if you have a family history of breast, ovarian, tubal, or peritoneal  cancers.  Pelvic exam and Pap test. This may be done every 3 years starting at age 60. Starting at age 89, this may be done every 5 years if you have a Pap test in combination with an HPV test. Other tests  Sexually transmitted disease (STD) testing.  Bone density scan. This is done to screen for osteoporosis. You may have this scan if you are at high risk for osteoporosis. Follow these instructions at home: Eating and drinking  Eat a diet that includes fresh fruits and vegetables, whole grains, lean protein, and low-fat dairy.  Take vitamin and mineral supplements as recommended by your health care provider.  Do not drink alcohol if: ? Your health care provider tells you not to drink. ? You are pregnant, may be pregnant, or are planning to become pregnant.  If you drink alcohol: ? Limit how much you have to 0-1 drink a day. ? Be aware of how much alcohol is in your drink. In the [place] one drink equals one 12 oz bottle of beer (355 mL), one 5 oz glass of wine (148 mL), or one 1 oz glass of hard liquor (44 mL). Lifestyle  Take daily care of your teeth and gums.  Stay active. Exercise for at least 30 minutes on 5 or more days each week.  Do not use any products that contain nicotine or tobacco, such as cigarettes, e-cigarettes, and chewing tobacco. If you need help quitting, ask your health care provider.  If you are sexually active, practice safe sex. Use a condom or other form of birth control (contraception) in order to prevent pregnancy and STIs (sexually transmitted infections).  If told by your health care provider, take low-dose aspirin daily starting at age 60. What's next?  Visit your health care provider once a year for a well check visit.  Ask your health care provider how often you should have your eyes and teeth checked.  Stay up to date on all vaccines. This information is not intended to replace advice given to you by your health care provider. Make sure you  discuss any questions you have with your health care provider. Document Revised: 03/10/2018 Document Reviewed: 03/10/2018 Elsevier Patient Education  2020 [employer].

## 2020-01-10 NOTE — Progress Notes (Signed)
HPI: Kathy Clark is a 59 y.o. female who  has a past medical history of Anxiety, Back pain, Complication of anesthesia, Depression, Essential hypertension, benign, Gastritis, GERD (gastroesophageal reflux disease), Hypertension, Joint pain, Lymphocytic colitis, Migraines, Osteoarthritis, Sciatica, and Swelling.  she presents to [facility] today, 01/10/20,  for chief complaint of:  Annual physical exam  Dental care-last visit 3 months ago, she goes every 6 months for cleanings.  No dental concerns today. Eye exam-yearly, due in July.  Wears glasses and contacts for correction. Exercise-recently diagnosed with RA and currently on methotrexate, she does a little walking here and there but notes that her endurance is pretty low these days. Diet-recently started an anti-inflammatory diet, reports her diet mostly consist of lean proteins and green vegetables.  Difficulty sleeping-with recent diagnosis of RA and treatment with methotrexate and prednisone she notes that she has been having difficulty sleeping.  She is okay during the day but at night she experiences "zings" down her legs and a random body aches.  She is taking Flexeril 5 mg which was working to help her sleep well but over the past couple of weeks she notes that it is not as effective.  Wonders if she may need to try to increase increase the Flexeril to 10 mg at night as needed or if she would benefit more from increasing her gabapentin.  Past medical, surgical, social and family history reviewed:  Patient Active Problem List   Diagnosis Date Noted  . Rheumatoid arthritis of multiple sites with negative rheumatoid factor ([place]) 12/04/2019  . Polyarthralgia 11/02/2019  . Myofascial pain 11/02/2019  . Arthralgia of both hands 10/03/2019  . Metatarsalgia of left foot 01/05/2018  . Other hyperlipidemia 03/23/2017  . OSA (obstructive sleep apnea) 02/25/2017  . Class 1 obesity without serious  comorbidity with body mass index (BMI) of 34.0 to 34.9 in adult 08/17/2016  . Elevated liver function tests 08/17/2016  . Vitamin D deficiency 08/03/2016  . Left tennis elbow 04/06/2016  . 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 Depression 08/23/2015  . Subacromial bursitis 04/23/2015  . Left carpal tunnel syndrome, post right carpal tunnel release 12/24/2014  . Trochanteric bursitis of right hip 04/21/2013  . Postop Mild Hyponatremia 07/30/2011  . OSTEOARTHRITIS, KNEE, LEFT 12/12/2009  . MIGRAINE WITH AURA 05/31/2008  . SKIN CANCER, HX OF 01/06/2008  . COLITIS, HX OF 01/06/2008  . ALLERGIC RHINITIS 05/20/2007  . HYPERTENSION, BENIGN ESSENTIAL 11/23/2006  . SCIATICA, CHRONIC 05/19/2006    Past Surgical History:  Procedure Laterality Date  . ABDOMINAL HYSTERECTOMY  07-2005  . [place] Bunionectomy Left 09/18/2016   Left Foot  . BACK SURGERY    . KNEE SURGERY  2006   left; multiple  . microdiscetomy  2005  . SHOULDER SURGERY  07-22-09   right  . SKIN CANCER EXCISION     eyelid  . TOTAL KNEE ARTHROPLASTY  07/27/2011   Procedure: TOTAL KNEE ARTHROPLASTY;  Surgeon: Gearlean Alf;  Location: [facility];  Service: Orthopedics;  Laterality: Left;    Social History   Tobacco Use  . Smoking status: Never Smoker  . Smokeless tobacco: Never Used  Substance Use Topics  . Alcohol use: Yes    Comment: occasionally    Family History  Problem Relation Age of Onset  . Breast cancer Other   . Tongue cancer Mother   . Breast cancer Mother   . Anxiety disorder Mother   . Depression Mother   . Heart disease  Father   . Hypertension Father   . Hyperlipidemia Father   . Stroke Paternal Grandmother   . Colon cancer Neg Hx      Current medication list and allergy/intolerance information reviewed:    Current Outpatient Medications  Medication Sig Dispense Refill  . AMBULATORY NON FORMULARY MEDICATION Medication Name: Please decrease CPAP setting to 8 cm of water pressure and fax [place] a download  after 5 days.  Is having a lot of difficulty with air leaks is working to try to reduce her pressure to see if she is still being adequately treated but with fewer leaks.FAx to [facility] 1 Units 0  . buPROPion (WELLBUTRIN SR) 150 MG 12 hr tablet Take 1 tablet (150 mg total) by mouth daily. 30 tablet 0  . Calcium Carbonate-Vitamin D (CALTRATE 600+D PO) Take 1 tablet by mouth daily.    . cyclobenzaprine (FLEXERIL) 5 MG tablet Take 1 tablet (5 mg total) by mouth at bedtime as needed for muscle spasms. 30 tablet 3  . EPINEPHrine 0.3 mg/0.3 mL IJ SOAJ injection Inject 0.3 mLs (0.3 mg total) into the muscle as needed for anaphylaxis. 2 each prn  . estradiol (VIVELLE-DOT) 0.075 MG/24HR   3  . folic acid (FOLVITE) 1 MG tablet Take 1 mg by mouth daily.    [name] gabapentin (NEURONTIN) 300 MG capsule 2 caps p.o. twice daily for a week then increase dose as needed up to a maximum of 3600 mg daily (Patient taking differently: Take 300 mg by mouth 2 (two) times daily. ) 180 capsule 3  . hydrochlorothiazide (HYDRODIURIL) 25 MG tablet Take 1 tablet (25 mg total) by mouth daily. 90 tablet 1  . losartan (COZAAR) 25 MG tablet TAKE 1 TABLET BY MOUTH DAILY. 90 tablet 2  . Melatonin 3 MG TABS Take by mouth as directed.    . methotrexate (RHEUMATREX) 2.5 MG tablet Take 8 tablets by mouth once a week.     . Multiple Vitamins-Minerals (MULTIVITAMIN ADULT PO) Take by mouth daily.    . nabumetone (RELAFEN) 500 MG tablet Take 1 tablet (500 mg total) by mouth daily as needed for moderate pain. 30 tablet 3  . ondansetron (ZOFRAN) 4 MG tablet Take 1 tablet (4 mg total) by mouth every 8 (eight) hours as needed for nausea or vomiting. 20 tablet 0  . SUMAtriptan (IMITREX) 20 MG/ACT nasal spray PLACE 1 SPRAY INTO THE NOSE EVERY 2 HOURS AS NEEDED FOR. HEADACHE 6 Inhaler 2  . Vilazodone HCl (VIIBRYD) 40 MG TABS Take 1 tablet (40 mg total) by mouth daily. 90 tablet 1   No current facility-administered medications for this visit.      Allergies  Allergen Reactions  . Succinylcholine Anaphylaxis  . Dilaudid [Hydromorphone Hcl] Nausea And Vomiting  . Nsaids Other (See Comments)    Trigger colits      Review of Systems:  Constitutional:  No  fever, no chills, No recent illness, No unintentional weight changes. No significant fatigue.   HEENT: No  headache, no vision change, no hearing change, No sore throat, No  sinus pressure  Cardiac: No  chest pain, No  pressure, No palpitations, No  Orthopnea  Respiratory:  No  shortness of breath. No  Cough  Gastrointestinal: No  abdominal pain, No  nausea, No  vomiting,  No  blood in stool, No  diarrhea, No  constipation   Musculoskeletal: + new myalgia/arthralgia  Skin: No  Rash, No other wounds/concerning lesions  Genitourinary: No  incontinence, No  abnormal genital bleeding, No abnormal genital discharge  Hem/Onc: No  easy bruising/bleeding, No  abnormal lymph node  Endocrine: No cold intolerance,  No heat intolerance. No polyuria/polydipsia/polyphagia   Neurologic: No  weakness, No  dizziness, No  slurred speech/focal weakness/facial droop  Psychiatric: No  concerns with depression, No  concerns with anxiety, + sleep problems, No mood problems  Exam:  BP 114/77   Pulse 74   Temp 97.7 F (36.5 C) (Oral)   Ht '5\' 8"'$  (1.727 m)   Wt 231 lb 6.4 oz (105 kg)   LMP  (LMP Unknown) Comment: hystrectomy  SpO2 99%   BMI 35.18 kg/m   Constitutional: VS see above. General Appearance: alert, well-developed, well-nourished, NAD  Eyes: Normal lids and conjunctive, non-icteric sclera  Ears, Nose, Mouth, Throat: MMM, Normal external inspection ears/nares/mouth/lips/gums. TM normal bilaterally. Pharynx/tonsils no erythema, no exudate. Nasal mucosa normal.   Neck: No masses, trachea midline. No thyroid enlargement. No tenderness/mass appreciated. No lymphadenopathy  Respiratory: Normal respiratory effort. no wheeze, no rhonchi, no rales  Cardiovascular: S1/S2  normal, no murmur, no rub/gallop auscultated. RRR. No lower extremity edema. Pedal pulse II/IV bilaterally DP and PT. No carotid bruit or JVD. No abdominal aortic bruit.  Gastrointestinal: Nontender, no masses. No hepatomegaly, no splenomegaly. No hernia appreciated. Bowel sounds normal. Rectal exam deferred.   Musculoskeletal: Gait normal. No clubbing/cyanosis of digits.   Neurological: Normal balance/coordination. No tremor. No cranial nerve deficit on limited exam. Motor and sensation intact and symmetric. Cerebellar reflexes intact.   Skin: warm, dry, intact. No rash/ulcer. No concerning nevi or subq nodules on limited exam.    Psychiatric: Normal judgment/insight. Normal mood and affect. Oriented x3.    No results found for this or any previous visit (from the past 72 hour(s)).  No results found.   ASSESSMENT/PLAN:   1. Annual physical exam Full lab work checked in March and April of this year so no need to draw more labs today.  Up-to-date on mammogram and Pap smear (OB/GYN).  Doing well on her anti-inflammatory diet.  Recommend increasing exercise as tolerated.  Weight loss may also should benefit for management of RA.  2.  Sleeping difficulties May try taking Flexeril 10 mg at night to see if this is more beneficial to help her sleep.  If this works well, recommend discussing further with Dr. [name] regarding continuing at the higher dose.  If this is not beneficial or causes too much grogginess, may benefit from nighttime gabapentin dose increase.  No orders of the defined types were placed in this encounter.   No orders of the defined types were placed in this encounter.   Patient Instructions  Preventive Care 62-39 Years Old, Female Preventive care refers to visits with your health care provider and lifestyle choices that can promote health and wellness. This includes:  A yearly physical exam. This may also be called an annual well check.  Regular dental visits and  eye exams.  Immunizations.  Screening for certain conditions.  Healthy lifestyle choices, such as eating a healthy diet, getting regular exercise, not using drugs or products that contain nicotine and tobacco, and limiting alcohol use. What can I expect for my preventive care visit? Physical exam Your health care provider will check your:  Height and weight. This may be used to calculate body mass index (BMI), which tells if you are at a healthy weight.  Heart rate and blood pressure.  Skin for abnormal spots. Counseling Your health care provider may ask  you questions about your:  Alcohol, tobacco, and drug use.  Emotional well-being.  Home and relationship well-being.  Sexual activity.  Eating habits.  Work and work Astronomer.  Method of birth control.  Menstrual cycle.  Pregnancy history. What immunizations do I need?  Influenza (flu) vaccine  This is recommended every year. Tetanus, diphtheria, and pertussis (Tdap) vaccine  You may need a Td booster every 10 years. Varicella (chickenpox) vaccine  You may need this if you have not been vaccinated. Zoster (shingles) vaccine  You may need this after age 23. Measles, mumps, and rubella (MMR) vaccine  You may need at least one dose of MMR if you were born in 1957 or later. You may also need a second dose. Pneumococcal conjugate (PCV13) vaccine  You may need this if you have certain conditions and were not previously vaccinated. Pneumococcal polysaccharide (PPSV23) vaccine  You may need one or two doses if you smoke cigarettes or if you have certain conditions. Meningococcal conjugate (MenACWY) vaccine  You may need this if you have certain conditions. Hepatitis A vaccine  You may need this if you have certain conditions or if you travel or work in places where you may be exposed to hepatitis A. Hepatitis B vaccine  You may need this if you have certain conditions or if you travel or work in places  where you may be exposed to hepatitis B. Haemophilus influenzae type b (Hib) vaccine  You may need this if you have certain conditions. Human papillomavirus (HPV) vaccine  If recommended by your health care provider, you may need three doses over 6 months. You may receive vaccines as individual doses or as more than one vaccine together in one shot (combination vaccines). Talk with your health care provider about the risks and benefits of combination vaccines. What tests do I need? Blood tests  Lipid and cholesterol levels. These may be checked every 5 years, or more frequently if you are over 15 years old.  Hepatitis C test.  Hepatitis B test. Screening  Lung cancer screening. You may have this screening every year starting at age 20 if you have a 30-pack-year history of smoking and currently smoke or have quit within the past 15 years.  Colorectal cancer screening. All adults should have this screening starting at age 50 and continuing until age 86. Your health care provider may recommend screening at age 48 if you are at increased risk. You will have tests every 1-10 years, depending on your results and the type of screening test.  Diabetes screening. This is done by checking your blood sugar (glucose) after you have not eaten for a while (fasting). You may have this done every 1-3 years.  Mammogram. This may be done every 1-2 years. Talk with your health care provider about when you should start having regular mammograms. This may depend on whether you have a family history of breast cancer.  BRCA-related cancer screening. This may be done if you have a family history of breast, ovarian, tubal, or peritoneal cancers.  Pelvic exam and Pap test. This may be done every 3 years starting at age 90. Starting at age 52, this may be done every 5 years if you have a Pap test in combination with an HPV test. Other tests  Sexually transmitted disease (STD) testing.  Bone density scan. This  is done to screen for osteoporosis. You may have this scan if you are at high risk for osteoporosis. Follow these instructions at home: Eating and  drinking  Eat a diet that includes fresh fruits and vegetables, whole grains, lean protein, and low-fat dairy.  Take vitamin and mineral supplements as recommended by your health care provider.  Do not drink alcohol if: ? Your health care provider tells you not to drink. ? You are pregnant, may be pregnant, or are planning to become pregnant.  If you drink alcohol: ? Limit how much you have to 0-1 drink a day. ? Be aware of how much alcohol is in your drink. In the [place] one drink equals one 12 oz bottle of beer (355 mL), one 5 oz glass of wine (148 mL), or one 1 oz glass of hard liquor (44 mL). Lifestyle  Take daily care of your teeth and gums.  Stay active. Exercise for at least 30 minutes on 5 or more days each week.  Do not use any products that contain nicotine or tobacco, such as cigarettes, e-cigarettes, and chewing tobacco. If you need help quitting, ask your health care provider.  If you are sexually active, practice safe sex. Use a condom or other form of birth control (contraception) in order to prevent pregnancy and STIs (sexually transmitted infections).  If told by your health care provider, take low-dose aspirin daily starting at age 60. What's next?  Visit your health care provider once a year for a well check visit.  Ask your health care provider how often you should have your eyes and teeth checked.  Stay up to date on all vaccines. This information is not intended to replace advice given to you by your health care provider. Make sure you discuss any questions you have with your health care provider. Document Revised: 03/10/2018 Document Reviewed: 03/10/2018 Elsevier Patient Education  [employer].  Follow-up plan: Return in about 1 year (around 01/09/2021) for annual physical exam with labs.  Clearnce Sorrel,  DNP, APRN, FNP-BC [facility]

## 2020-01-11 ENCOUNTER — Ambulatory Visit (INDEPENDENT_AMBULATORY_CARE_PROVIDER_SITE_OTHER): Payer: 59 | Admitting: Physician Assistant

## 2020-01-11 ENCOUNTER — Encounter (INDEPENDENT_AMBULATORY_CARE_PROVIDER_SITE_OTHER): Payer: Self-pay | Admitting: Physician Assistant

## 2020-01-11 VITALS — BP 138/80 | HR 72 | Temp 97.5°F | Ht 67.0 in | Wt 228.0 lb

## 2020-01-11 DIAGNOSIS — Z6835 Body mass index (BMI) 35.0-35.9, adult: Secondary | ICD-10-CM

## 2020-01-11 DIAGNOSIS — E559 Vitamin D deficiency, unspecified: Secondary | ICD-10-CM | POA: Diagnosis not present

## 2020-01-11 NOTE — Progress Notes (Signed)
Chief Complaint:   Kathy Clark is here to discuss her progress with her obesity treatment plan along with follow-up of her obesity related diagnoses. Kathy Clark is following a lower carbohydrate, vegetable and lean protein rich diet plan and states she is following her eating plan approximately 80% of the time. Kathy Clark states she is walking 20 minutes 4-5 times per week.  Today's visit was #: 66 Starting weight: 237 lbs Starting date: 08/03/2016 Today's weight: 228 lbs Today's date: 01/11/2020 Total lbs lost to date: 9 Total lbs lost since last in-office visit: 0  Interim History: Kathy Clark reports that she traveled to [place] for a few days and did not eat on plan. She states she is getting tired of the low carb meal plan.  Subjective:   Vitamin D deficiency. Kathy Clark is on OTC Vitamin D supplementation. No nausea, vomiting, or muscle weakness.    Ref. Range 11/08/2019 08:06  Vitamin D, 25-Hydroxy Latest Ref Range: 30.0 - 100.0 ng/mL 58.1   Assessment/Plan:   Vitamin D deficiency. Low Vitamin D level contributes to fatigue and are associated with obesity, breast, and colon cancer. She agrees to continue to take Vitamin D and will follow-up for routine testing of Vitamin D, at least 2-3 times per year to avoid over-replacement.  Class 2 severe obesity with serious comorbidity and body mass index (BMI) of 35.0 to 35.9 in adult, unspecified obesity type ([place]).  Kathy Clark is currently in the action stage of change. As such, her goal is to continue with weight loss efforts. She has agreed to keeping a food journal and adhering to recommended goals of 1300-1400 calories and 85 grams of protein daily.   Exercise goals: For substantial health benefits, adults should do at least 150 minutes (2 hours and 30 minutes) a week of moderate-intensity, or 75 minutes (1 hour and 15 minutes) a week of vigorous-intensity aerobic physical activity, or an equivalent combination of moderate- and  vigorous-intensity aerobic activity. Aerobic activity should be performed in episodes of at least 10 minutes, and preferably, it should be spread throughout the week.  Behavioral modification strategies: increasing lean protein intake and keeping healthy foods in the home.  Kathy Clark has agreed to follow-up with our clinic in 3 weeks. She was informed of the importance of frequent follow-up visits to maximize her success with intensive lifestyle modifications for her multiple health conditions.   Objective:   Blood pressure 138/80, pulse 72, temperature (!) 97.5 F (36.4 C), temperature source Oral, height 5\' 7"  (1.702 m), weight 228 lb (103.4 kg), SpO2 98 %. Body mass index is 35.71 kg/m.  General: Cooperative, alert, well developed, in no acute distress. HEENT: Conjunctivae and lids unremarkable. Cardiovascular: Regular rhythm.  Lungs: Normal work of breathing. Neurologic: No focal deficits.   Lab Results  Component Value Date   CREATININE 0.69 11/08/2019   BUN 18 11/08/2019   NA 139 11/08/2019   K 4.1 11/08/2019   CL 100 11/08/2019   CO2 25 11/08/2019   Lab Results  Component Value Date   ALT 18 11/08/2019   AST 18 11/08/2019   ALKPHOS 66 11/08/2019   BILITOT 0.3 11/08/2019   Lab Results  Component Value Date   HGBA1C 5.2 11/08/2019   HGBA1C 5.1 02/14/2019   HGBA1C 5.1 12/01/2016   HGBA1C 4.9 08/03/2016   Lab Results  Component Value Date   INSULIN 6.1 11/08/2019   INSULIN 5.4 12/01/2016   INSULIN 5.9 08/03/2016   Lab Results  Component Value Date  TSH 2.61 10/03/2019   Lab Results  Component Value Date   CHOL 201 (H) 11/08/2019   HDL 51 11/08/2019   LDLCALC 132 (H) 11/08/2019   TRIG 100 11/08/2019   CHOLHDL 3.9 11/08/2019   Lab Results  Component Value Date   WBC 7.1 10/03/2019   HGB 13.7 10/03/2019   HCT 40.9 10/03/2019   MCV 89.5 10/03/2019   PLT 343 10/03/2019   Lab Results  Component Value Date   FERRITIN 142 12/25/2015   Attestation  Statements:   Reviewed by clinician on day of visit: allergies, medications, problem list, medical history, surgical history, family history, social history, and previous encounter notes.  Time spent on visit including pre-visit chart review and post-visit charting and care was 30 minutes.   IMichaelene Song, am acting as transcriptionist for Abby Potash, PA-C   I have reviewed the above documentation for accuracy and completeness, and I agree with the above. Abby Potash, PA-C

## 2020-01-16 MED FILL — METHOTREXATE 2.5 MG TAB: 2.5 | 28 days supply | Qty: 32 | Fill #0

## 2020-01-25 DIAGNOSIS — G4733 Obstructive sleep apnea (adult) (pediatric): Secondary | ICD-10-CM | POA: Diagnosis not present

## 2020-01-26 MED FILL — HYDROCHLOROTHIAZIDE 25 MG T: 25 | 90 days supply | Qty: 90 | Fill #0

## 2020-02-01 ENCOUNTER — Other Ambulatory Visit: Payer: Self-pay

## 2020-02-01 ENCOUNTER — Ambulatory Visit (INDEPENDENT_AMBULATORY_CARE_PROVIDER_SITE_OTHER): Payer: 59 | Admitting: Physician Assistant

## 2020-02-01 ENCOUNTER — Encounter (INDEPENDENT_AMBULATORY_CARE_PROVIDER_SITE_OTHER): Payer: Self-pay | Admitting: Physician Assistant

## 2020-02-01 VITALS — BP 111/72 | HR 83 | Temp 97.6°F | Ht 67.0 in | Wt 228.0 lb

## 2020-02-01 DIAGNOSIS — F3289 Other specified depressive episodes: Secondary | ICD-10-CM

## 2020-02-01 DIAGNOSIS — Z9189 Other specified personal risk factors, not elsewhere classified: Secondary | ICD-10-CM

## 2020-02-01 DIAGNOSIS — Z6835 Body mass index (BMI) 35.0-35.9, adult: Secondary | ICD-10-CM | POA: Diagnosis not present

## 2020-02-01 DIAGNOSIS — E7849 Other hyperlipidemia: Secondary | ICD-10-CM

## 2020-02-01 MED ORDER — BUPROPION HCL ER (SR) 150 MG PO TB12: 150.0000 mg | ORAL_TABLET | Freq: Every morning | ORAL | 0 refills | Status: DC

## 2020-02-01 MED FILL — BUPROPION HCL SR 150 MG TAB: 150 | 30 days supply | Qty: 30 | Fill #0

## 2020-02-02 MED FILL — LOSARTAN POTASSIUM 25 MG TA: 25 | 90 days supply | Qty: 90 | Fill #1

## 2020-02-05 NOTE — Progress Notes (Signed)
Chief Complaint:   OBESITY Kathy Clark is here to discuss her progress with her obesity treatment plan along with follow-up of her obesity related diagnoses. [name] is keeping a food journal and adhering to recommended goals of 1300-1400 calories and 85 grams of protein and states she is following her eating plan approximately 50% of the time. [name] states she is walking 30 minutes 4-5 times per week.  Today's visit was #: 104 Starting weight: 237 lbs Starting date: 08/03/2016 Today's weight: 228 lbs Today's date: 02/01/2020 Total lbs lost to date: 9 Total lbs lost since last in-office visit: 0  Interim History: [name] has been on vacation and did not eat on plan. She also has noticed an increase in cravings since being off of Wellbutrin.  Subjective:   Other depression with emotional eating.[name] is struggling with emotional eating and using food for comfort to the extent that it is negatively impacting her health. She has been working on behavior modification techniques to help reduce her emotional eating and has been somewhat successful. She shows no sign of suicidal or homicidal ideations. [name] has noticed increased cravings since being off of Wellbutrin.  Other hyperlipidemia. [name] is on no medication. She is exercising regularly.   Lab Results  Component Value Date   CHOL 201 (H) 11/08/2019   HDL 51 11/08/2019   LDLCALC 132 (H) 11/08/2019   TRIG 100 11/08/2019   CHOLHDL 3.9 11/08/2019   Lab Results  Component Value Date   ALT 18 11/08/2019   AST 18 11/08/2019   ALKPHOS 66 11/08/2019   BILITOT 0.3 11/08/2019   The 10-year ASCVD risk score [name] DC Jr., et al., 2013) is: 3.2%   Values used to calculate the score:     Age: 10 years     Sex: Female     Is Non-Hispanic African American: No     Diabetic: No     Tobacco smoker: No     Systolic Blood Pressure: 527 mmHg     Is BP treated: Yes     HDL Cholesterol: 51 mg/dL     Total Cholesterol: 201 mg/dL  At risk  for heart disease. [name] is at a higher than average risk for cardiovascular disease due to obesity.   Assessment/Plan:   Other depression with emotional eating. Behavior modification techniques were discussed today to help [name] deal with her emotional/non-hunger eating behaviors.  Orders and follow up as documented in patient record. [name] will restart buPROPion (WELLBUTRIN SR) 150 MG 12 hr tablet QAM #30 with 0 refills.  Other hyperlipidemia. Cardiovascular risk and specific lipid/LDL goals reviewed.  We discussed several lifestyle modifications today and [name] will continue to work on diet, exercise and weight loss efforts. Orders and follow up as documented in patient record.   Counseling Intensive lifestyle modifications are the first line treatment for this issue. . Dietary changes: Increase soluble fiber. Decrease simple carbohydrates. . Exercise changes: Moderate to vigorous-intensity aerobic activity 150 minutes per week if tolerated. . Lipid-lowering medications: see documented in medical record.  At risk for heart disease. [name] was given approximately 15 minutes of coronary artery disease prevention counseling today. She is 59 y.o. female and has risk factors for heart disease including obesity. We discussed intensive lifestyle modifications today with an emphasis on specific weight loss instructions and strategies.   Repetitive spaced learning was employed today to elicit superior memory formation and behavioral change.  Class 2 severe obesity with serious comorbidity and body mass index (  BMI) of 35.0 to 35.9 in adult, unspecified obesity type ([place]).  [name] is currently in the action stage of change. As such, her goal is to continue with weight loss efforts. She has agreed to keeping a food journal and adhering to recommended goals of 1300-1400 calories and 85 grams of protein daily.   Exercise goals: For substantial health benefits, adults should do at least 150 minutes (2 hours  and 30 minutes) a week of moderate-intensity, or 75 minutes (1 hour and 15 minutes) a week of vigorous-intensity aerobic physical activity, or an equivalent combination of moderate- and vigorous-intensity aerobic activity. Aerobic activity should be performed in episodes of at least 10 minutes, and preferably, it should be spread throughout the week.  Behavioral modification strategies: avoiding temptations and planning for success.  [name] has agreed to follow-up with our clinic in 4 weeks. She was informed of the importance of frequent follow-up visits to maximize her success with intensive lifestyle modifications for her multiple health conditions.   Objective:   Blood pressure 111/72, pulse 83, temperature 97.6 F (36.4 C), temperature source Oral, height 5\' 7"  (1.702 m), weight (!) 228 lb (103.4 kg), SpO2 97 %. Body mass index is 35.71 kg/m.  General: Cooperative, alert, well developed, in no acute distress. HEENT: Conjunctivae and lids unremarkable. Cardiovascular: Regular rhythm.  Lungs: Normal work of breathing. Neurologic: No focal deficits.   Lab Results  Component Value Date   CREATININE 0.69 11/08/2019   BUN 18 11/08/2019   NA 139 11/08/2019   K 4.1 11/08/2019   CL 100 11/08/2019   CO2 25 11/08/2019   Lab Results  Component Value Date   ALT 18 11/08/2019   AST 18 11/08/2019   ALKPHOS 66 11/08/2019   BILITOT 0.3 11/08/2019   Lab Results  Component Value Date   HGBA1C 5.2 11/08/2019   HGBA1C 5.1 02/14/2019   HGBA1C 5.1 12/01/2016   HGBA1C 4.9 08/03/2016   Lab Results  Component Value Date   INSULIN 6.1 11/08/2019   INSULIN 5.4 12/01/2016   INSULIN 5.9 08/03/2016   Lab Results  Component Value Date   TSH 2.61 10/03/2019   Lab Results  Component Value Date   CHOL 201 (H) 11/08/2019   HDL 51 11/08/2019   LDLCALC 132 (H) 11/08/2019   TRIG 100 11/08/2019   CHOLHDL 3.9 11/08/2019   Lab Results  Component Value Date   WBC 7.1 10/03/2019   HGB 13.7  10/03/2019   HCT 40.9 10/03/2019   MCV 89.5 10/03/2019   PLT 343 10/03/2019   Lab Results  Component Value Date   FERRITIN 142 12/25/2015   Attestation Statements:   Reviewed by clinician on day of visit: allergies, medications, problem list, medical history, surgical history, family history, social history, and previous encounter notes.  IMichaelene Song, am acting as transcriptionist for Abby Potash, PA-C   I have reviewed the above documentation for accuracy and completeness, and I agree with the above. Abby Potash, PA-C

## 2020-02-08 ENCOUNTER — Encounter: Payer: Self-pay | Admitting: Family Medicine

## 2020-02-08 ENCOUNTER — Ambulatory Visit (INDEPENDENT_AMBULATORY_CARE_PROVIDER_SITE_OTHER): Payer: 59 | Admitting: Family Medicine

## 2020-02-08 VITALS — BP 120/72 | HR 79 | Ht 67.0 in | Wt 233.0 lb

## 2020-02-08 DIAGNOSIS — H52223 Regular astigmatism, bilateral: Secondary | ICD-10-CM | POA: Diagnosis not present

## 2020-02-08 DIAGNOSIS — T887XXA Unspecified adverse effect of drug or medicament, initial encounter: Secondary | ICD-10-CM | POA: Diagnosis not present

## 2020-02-08 DIAGNOSIS — H5213 Myopia, bilateral: Secondary | ICD-10-CM | POA: Diagnosis not present

## 2020-02-08 DIAGNOSIS — M7552 Bursitis of left shoulder: Secondary | ICD-10-CM | POA: Diagnosis not present

## 2020-02-08 DIAGNOSIS — H524 Presbyopia: Secondary | ICD-10-CM | POA: Diagnosis not present

## 2020-02-08 MED ORDER — CELECOXIB 100 MG PO CAPS: 100.0000 mg | ORAL_CAPSULE | Freq: Two times a day (BID) | ORAL | 2 refills | Status: DC | PRN

## 2020-02-08 MED FILL — CELECOXIB 100 MG CAPS: 100 | 30 days supply | Qty: 60 | Fill #0

## 2020-02-08 NOTE — Patient Instructions (Signed)
Shoulder Impingement Syndrome Rehab Ask your health care provider which exercises are safe for you. Do exercises exactly as told by your health care provider and adjust them as directed. It is normal to feel mild stretching, pulling, tightness, or discomfort as you do these exercises. Stop right away if you feel sudden pain or your pain gets worse. Do not begin these exercises until told by your health care provider. Stretching and range-of-motion exercise This exercise warms up your muscles and joints and improves the movement and flexibility of your shoulder. This exercise also helps to relieve pain and stiffness. Passive horizontal adduction In passive adduction, you use your other hand to move the injured arm toward your body. The injured arm does not move on its own. In this movement, your arm is moved across your body in the horizontal plane (horizontal adduction). 1. Sit or stand and pull your left / right elbow across your chest, toward your other shoulder. Stop when you feel a gentle stretch in the back of your shoulder and upper arm. ? Keep your arm at shoulder height. ? Keep your arm as close to your body as you comfortably can. 2. Hold for __________ seconds. 3. Slowly return to the starting position. Repeat __________ times. Complete this exercise __________ times a day. Strengthening exercises These exercises build strength and endurance in your shoulder. Endurance is the ability to use your muscles for a long time, even after they get tired. External rotation, isometric This is an exercise in which you press the back of your wrist against a door frame without moving your shoulder joint (isometric). 1. Stand or sit in a doorway, facing the door frame. 2. Bend your left / right elbow and place the back of your wrist against the door frame. Only the back of your wrist should be touching the frame. Keep your upper arm at your side. 3. Gently press your wrist against the door frame, as if  you are trying to push your arm away from your abdomen (external rotation). Press as hard as you are able without pain. ? Avoid shrugging your shoulder while you press your wrist against the door frame. Keep your shoulder blade tucked down toward the middle of your back. 4. Hold for __________ seconds. 5. Slowly release the tension, and relax your muscles completely before you repeat the exercise. Repeat __________ times. Complete this exercise __________ times a day. Internal rotation, isometric This is an exercise in which you press your palm against a door frame without moving your shoulder joint (isometric). 1. Stand or sit in a doorway, facing the door frame. 2. Bend your left / right elbow and place the palm of your hand against the door frame. Only your palm should be touching the frame. Keep your upper arm at your side. 3. Gently press your hand against the door frame, as if you are trying to push your arm toward your abdomen (internal rotation). Press as hard as you are able without pain. ? Avoid shrugging your shoulder while you press your hand against the door frame. Keep your shoulder blade tucked down toward the middle of your back. 4. Hold for __________ seconds. 5. Slowly release the tension, and relax your muscles completely before you repeat the exercise. Repeat __________ times. Complete this exercise __________ times a day. Scapular protraction, supine  1. Lie on your back on a firm surface (supine position). Hold a __________ weight in your left / right hand. 2. Raise your left / right arm straight   into the air so your hand is directly above your shoulder joint. 3. Push the weight into the air so your shoulder (scapula) lifts off the surface that you are lying on. The scapula will push up or forward (protraction). Do not move your head, neck, or back. 4. Hold for __________ seconds. 5. Slowly return to the starting position. Let your muscles relax completely before you repeat  this exercise. Repeat __________ times. Complete this exercise __________ times a day. Scapular retraction  1. Sit in a stable chair without armrests, or stand up. 2. Secure an exercise band to a stable object in front of you so the band is at shoulder height. 3. Hold one end of the exercise band in each hand. Your palms should face down. 4. Squeeze your shoulder blades together (retraction) and move your elbows slightly behind you. Do not shrug your shoulders upward while you do this. 5. Hold for __________ seconds. 6. Slowly return to the starting position. Repeat __________ times. Complete this exercise __________ times a day. Shoulder extension  1. Sit in a stable chair without armrests, or stand up. 2. Secure an exercise band to a stable object in front of you so the band is above shoulder height. 3. Hold one end of the exercise band in each hand. 4. Straighten your elbows and lift your hands up to shoulder height. 5. Squeeze your shoulder blades together and pull your hands down to the sides of your thighs (extension). Stop when your hands are straight down by your sides. Do not let your hands go behind your body. 6. Hold for __________ seconds. 7. Slowly return to the starting position. Repeat __________ times. Complete this exercise __________ times a day. This information is not intended to replace advice given to you by your health care provider. Make sure you discuss any questions you have with your health care provider. Document Revised: 10/21/2018 Document Reviewed: 07/25/2018 [employer] Patient Education  2020 [employer]  

## 2020-02-08 NOTE — Progress Notes (Signed)
Established Patient Office Visit  Subjective:  Patient ID: Kathy Clark, female    DOB: [birth year]  Age: 59 y.o. MRN: [id number]  CC:  Chief Complaint  Patient presents with  . Shoulder Pain    HPI [name] presents for Left shoulder pain for a couple of weeks. Her father died and her husbands mother died and they have been doing a lot of packing.  She has had bursitis in that shoulder previously and was evaluated and treated in 2016 by Dr. Dianah Field.   History of rotator cuff repair on the right as well.  She has bere using Relafen but it has been upsetting her stomach and would like to try Celebrex.  This is the methotrexate that she is on for her rheumatoid is also been upsetting her stomach.  Recently her dose was increased to 8 tabs weekly and she says she just feels really weak and tired and achy the following day.  To the point where sometimes is difficult to get out of bed.  She has a follow-up with the end of the month with rheumatologist.  Unfortunately Dr. Posey Pronto left the practice so she will be seeing someone new there.  Past Medical History:  Diagnosis Date  . Anxiety   . Back pain   . 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 Depression   . Essential hypertension, benign   . Gastritis   . GERD (gastroesophageal reflux disease)   . Hypertension   . Joint pain   . Lymphocytic colitis    microscopic  . Migraines   . Osteoarthritis   . Sciatica    right leg  . Swelling    feet, left foot    Past Surgical History:  Procedure Laterality Date  . ABDOMINAL HYSTERECTOMY  07-2005  . [place] Bunionectomy Left 09/18/2016   Left Foot  . BACK SURGERY    . KNEE SURGERY  2006   left; multiple  . microdiscetomy  2005  . SHOULDER SURGERY  07-22-09   right  . SKIN CANCER EXCISION     eyelid  . TOTAL KNEE ARTHROPLASTY  07/27/2011   Procedure: TOTAL KNEE ARTHROPLASTY;  Surgeon: Gearlean Alf;  Location: [facility];  Service: Orthopedics;   Laterality: Left;    Family History  Problem Relation Age of Onset  . Breast cancer Other   . Tongue cancer Mother   . Breast cancer Mother   . Anxiety disorder Mother   . Depression Mother   . Heart disease Father   . Hypertension Father   . Hyperlipidemia Father   . Stroke Paternal Grandmother   . Colon cancer Neg Hx     Social History   Socioeconomic History  . Marital status: Married    Spouse name: Jenny Reichmann  . Number of children: 2  . Years of education: Not on file  . Highest education level: Not on file  Occupational History  . Occupation: Programmer, multimedia: [employer]  Tobacco Use  . Smoking status: Never Smoker  . Smokeless tobacco: Never Used  Vaping Use  . Vaping Use: Never used  Substance and Sexual Activity  . Alcohol use: Yes    Comment: occasionally  . Drug use: No  . 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  Other Topics Concern  . Not on file  Social History Narrative  . Not on file  Social Determinants of Health   Financial Resource Strain:   . Difficulty of Paying Living Expenses:   Food Insecurity:   . Worried About Charity fundraiser in the Last Year:   . Arboriculturist in the Last Year:   Transportation Needs:   . Film/video editor (Medical):   [name] Lack of Transportation (Non-Medical):   Physical Activity:   . Days of Exercise per Week:   . Minutes of Exercise per Session:   Stress:   . Feeling of Stress :   Social Connections:   . Frequency of Communication with Friends and Family:   . Frequency of Social Gatherings with Friends and Family:   . Attends Religious Services:   . Active Member of Clubs or Organizations:   . Attends Archivist Meetings:   [name] Marital Status:   Intimate Partner Violence:   . Fear of Current or Ex-Partner:   . Emotionally Abused:   [name] Physically Abused:   . Sexually Abused:     Outpatient Medications  Prior to Visit  Medication Sig Dispense Refill  . AMBULATORY NON FORMULARY MEDICATION Medication Name: Please decrease CPAP setting to 8 cm of water pressure and fax [place] a download after 5 days.  Is having a lot of difficulty with air leaks is working to try to reduce her pressure to see if she is still being adequately treated but with fewer leaks.FAx to [place] 1 Units 0  . buPROPion (WELLBUTRIN SR) 150 MG 12 hr tablet Take 1 tablet (150 mg total) by mouth in the morning. 30 tablet 0  . Calcium Carbonate-Vitamin D (CALTRATE 600+D PO) Take 1 tablet by mouth daily.    . cyclobenzaprine (FLEXERIL) 5 MG tablet Take 1 tablet (5 mg total) by mouth at bedtime as needed for muscle spasms. 30 tablet 3  . estradiol (VIVELLE-DOT) 0.075 JG/28ZM   3  . folic acid (FOLVITE) 1 MG tablet Take 1 mg by mouth daily.    [name] gabapentin (NEURONTIN) 300 MG capsule 2 caps p.o. twice daily for a week then increase dose as needed up to a maximum of 3600 mg daily (Patient taking differently: Take 300 mg by mouth 2 (two) times daily. ) 180 capsule 3  . hydrochlorothiazide (HYDRODIURIL) 25 MG tablet Take 1 tablet (25 mg total) by mouth daily. 90 tablet 1  . losartan (COZAAR) 25 MG tablet TAKE 1 TABLET BY MOUTH DAILY. 90 tablet 2  . Melatonin 3 MG TABS Take by mouth as directed.    . methotrexate (RHEUMATREX) 2.5 MG tablet Take 8 tablets by mouth once a week.     . Multiple Vitamins-Minerals (MULTIVITAMIN ADULT PO) Take by mouth daily.    . nabumetone (RELAFEN) 500 MG tablet Take 1 tablet (500 mg total) by mouth daily as needed for moderate pain. 30 tablet 3  . ondansetron (ZOFRAN) 4 MG tablet Take 1 tablet (4 mg total) by mouth every 8 (eight) hours as needed for nausea or vomiting. 20 tablet 0  . SUMAtriptan (IMITREX) 20 MG/ACT nasal spray PLACE 1 SPRAY INTO THE NOSE EVERY 2 HOURS AS NEEDED FOR. HEADACHE 6 Inhaler 2  . Vilazodone HCl (VIIBRYD) 40 MG TABS Take 1 tablet (40 mg total) by mouth daily. 90 tablet 1  .  EPINEPHrine 0.3 mg/0.3 mL IJ SOAJ injection Inject 0.3 mLs (0.3 mg total) into the muscle as needed for anaphylaxis. (Patient not taking: Reported on 02/08/2020) 2 each prn   No facility-administered medications prior  to visit.    Allergies  Allergen Reactions  . Succinylcholine Anaphylaxis  . Dilaudid [Hydromorphone Hcl] Nausea And Vomiting  . Nsaids Other (See Comments)    Trigger colits    ROS Review of Systems    Objective:    Physical Exam Vitals reviewed.  Constitutional:      Appearance: She is well-developed.  HENT:     Head: Normocephalic and atraumatic.  Eyes:     Conjunctiva/sclera: Conjunctivae normal.  Cardiovascular:     Rate and Rhythm: Normal rate.  Pulmonary:     Effort: Pulmonary effort is normal.  Musculoskeletal:     Comments: Shoulders with normal range of motion though she does have pain with full extension and internal rotation.  She is able to crossover without significant difficulty.  Negative empty can test.  Shoulder, elbow and wrist 5 out of 5 strength.  Skin:    General: Skin is dry.     Coloration: Skin is not pale.  Neurological:     Mental Status: She is alert and oriented to person, place, and time.  Psychiatric:        Behavior: Behavior normal.     BP 120/72   Pulse 79   Ht 5\' 7"  (1.702 m)   Wt (!) 233 lb (105.7 kg)   LMP  (LMP Unknown) Comment: hystrectomy  SpO2 99%   BMI 36.49 kg/m  Wt Readings from Last 3 Encounters:  02/08/20 (!) 233 lb (105.7 kg)  02/01/20 (!) 228 lb (103.4 kg)  01/11/20 228 lb (103.4 kg)     There are no preventive care reminders to display for this patient.  There are no preventive care reminders to display for this patient.  Lab Results  Component Value Date   TSH 2.61 10/03/2019   Lab Results  Component Value Date   WBC 7.1 10/03/2019   HGB 13.7 10/03/2019   HCT 40.9 10/03/2019   MCV 89.5 10/03/2019   PLT 343 10/03/2019   Lab Results  Component Value Date   NA 139 11/08/2019   K  4.1 11/08/2019   CO2 25 11/08/2019   GLUCOSE 84 11/08/2019   BUN 18 11/08/2019   CREATININE 0.69 11/08/2019   BILITOT 0.3 11/08/2019   ALKPHOS 66 11/08/2019   AST 18 11/08/2019   ALT 18 11/08/2019   PROT 6.8 11/08/2019   ALBUMIN 4.3 11/08/2019   CALCIUM 9.4 11/08/2019   Lab Results  Component Value Date   CHOL 201 (H) 11/08/2019   Lab Results  Component Value Date   HDL 51 11/08/2019   Lab Results  Component Value Date   LDLCALC 132 (H) 11/08/2019   Lab Results  Component Value Date   TRIG 100 11/08/2019   Lab Results  Component Value Date   CHOLHDL 3.9 11/08/2019   Lab Results  Component Value Date   HGBA1C 5.2 11/08/2019      Assessment & Plan:   Problem List Items Addressed This Visit      Musculoskeletal and Integument   Bursitis of left shoulder - Primary    Other Visit Diagnoses    Medication side effect         Left shoulder pain-most consistent with bursitis-discussed treatment including icing, avoiding repetitive movements and resting it.  Given a handout to do exercises on her own at home as well as a band.  We will switch to Celebrex so that she can take it regularly for the next week or 2 and see if this is  improving.  If not then consider evaluation for injection.  Medication side effect-we will try switching from Relafen to Celebrex. She has tried taking a PPI as well.   Meds ordered this encounter  Medications  . celecoxib (CELEBREX) 100 MG capsule    Sig: Take 1 capsule (100 mg total) by mouth 2 (two) times daily as needed.    Dispense:  60 capsule    Refill:  2    Follow-up: No follow-ups on file.    Beatrice Lecher, MD

## 2020-02-19 ENCOUNTER — Ambulatory Visit: Payer: 59 | Admitting: Rheumatology

## 2020-02-19 MED FILL — METHOTREXATE SODIUM 2.5 MG: 2.5 | 28 days supply | Qty: 32 | Fill #1

## 2020-02-23 ENCOUNTER — Encounter: Payer: Self-pay | Admitting: Family Medicine

## 2020-02-23 DIAGNOSIS — G5603 Carpal tunnel syndrome, bilateral upper limbs: Secondary | ICD-10-CM

## 2020-02-23 MED FILL — CYCLOBENZAPRINE HCL 5 MG TA: 5 | 30 days supply | Qty: 30 | Fill #2

## 2020-02-23 MED FILL — FOLIC ACID 1 MG TABS: 1 | 90 days supply | Qty: 90 | Fill #1

## 2020-02-23 MED FILL — DOTTI 0.075 MG/24HR PTTW: 0.075 | 84 days supply | Qty: 24 | Fill #1

## 2020-02-26 ENCOUNTER — Other Ambulatory Visit: Payer: Self-pay | Admitting: Family Medicine

## 2020-02-26 MED ORDER — GABAPENTIN 300 MG PO CAPS: ORAL_CAPSULE | ORAL | 3 refills | Status: DC

## 2020-02-28 ENCOUNTER — Telehealth: Payer: Self-pay | Admitting: Family Medicine

## 2020-02-28 ENCOUNTER — Encounter: Payer: Self-pay | Admitting: Family Medicine

## 2020-02-28 ENCOUNTER — Telehealth (INDEPENDENT_AMBULATORY_CARE_PROVIDER_SITE_OTHER): Payer: 59 | Admitting: Family Medicine

## 2020-02-28 VITALS — Temp 99.0°F

## 2020-02-28 DIAGNOSIS — R05 Cough: Secondary | ICD-10-CM

## 2020-02-28 DIAGNOSIS — J069 Acute upper respiratory infection, unspecified: Secondary | ICD-10-CM | POA: Diagnosis not present

## 2020-02-28 DIAGNOSIS — R059 Cough, unspecified: Secondary | ICD-10-CM

## 2020-02-28 NOTE — Telephone Encounter (Signed)
Received fax for PA on Gabapentin sent through cover my meds waiting on determination. - CF

## 2020-02-28 NOTE — Addendum Note (Signed)
Addended by: Narda Rutherford on: 02/28/2020 01:18 PM   Modules accepted: Orders

## 2020-02-28 NOTE — Progress Notes (Signed)
She had a temp of 100.5 last night around 930 PM.  She reports that she has been having sxs since yesterday. She hasnt been around anyone sick that she knows of.  She has facial pain pressure and headache. Taking tylenol. Took mucinex dm on Monday only for the cough but stopped since her cough was dry

## 2020-02-28 NOTE — Progress Notes (Signed)
Virtual Visit via Telephone Note  I connected with Kathy Clark on 02/28/20 at 10:10 AM EDT by telephone and verified that I am speaking with the correct person using two identifiers.   I discussed the limitations of evaluation and management by telemedicine and the availability of in person appointments. The patient expressed understanding and agreed to proceed.  Patient location: at home Provider location: in office  Subjective:    CC: Cough and fever  HPI:  Started with dry cough 3 days ago. Ran temp last night.  She had a temp of 100.5 last night around 930 PM.  She reports that she has been having sxs since yesterday. She hasnt been around anyone sick that she knows of.  She has facial pain pressure and headache. Taking tylenol. Took mucinex dm on Monday only for the cough but stopped since her cough was dry.  She has had some mild throat irritation.  Her chest feels "cottony".  No chest pain.  She did take her methotrexate on Sunday and thought initially she was just not feeling well because of that because she often does not feel great for couple days after her dosing.  She now has a little bit of soreness in her upper back and chest more from coughing she is had a little bit of mild nausea as well.  No vomiting or diarrhea.  Past medical history, Surgical history, Family history not pertinant except as noted below, Social history, Allergies, and medications have been entered into the medical record, reviewed, and corrections made.   Review of Systems: No fevers, chills, night sweats, weight loss, chest pain, or shortness of breath.   Objective:    General: Speaking clearly in complete sentences without any shortness of breath.  Alert and oriented x3.  Normal judgment.   Impression and Recommendations:    No problem-specific Assessment & Plan notes found for this encounter.  Cough/URI -likely viral suspect possible Covid.  Recommend that she get tested she mostly works at  home but does go back some.  No known exposures.  We will get her swabbed today.  Call if develops any new symptoms otherwise continue conservative care with hydration, anti-inflammatory and/or Tylenol for pain and fever relief.    Time spent in encounter 15 minutes  I discussed the assessment and treatment plan with the patient. The patient was provided an opportunity to ask questions and all were answered. The patient agreed with the plan and demonstrated an understanding of the instructions.   The patient was advised to call back or seek an in-person evaluation if the symptoms worsen or if the condition fails to improve as anticipated.   Beatrice Lecher, MD

## 2020-02-28 NOTE — Telephone Encounter (Signed)
Can you see if [facility] can transfer to [facility] for mail order

## 2020-02-29 ENCOUNTER — Telehealth: Payer: 59 | Admitting: Family Medicine

## 2020-02-29 ENCOUNTER — Other Ambulatory Visit: Payer: Self-pay

## 2020-02-29 ENCOUNTER — Ambulatory Visit (INDEPENDENT_AMBULATORY_CARE_PROVIDER_SITE_OTHER): Payer: 59 | Admitting: Physician Assistant

## 2020-02-29 DIAGNOSIS — G5603 Carpal tunnel syndrome, bilateral upper limbs: Secondary | ICD-10-CM

## 2020-02-29 MED ORDER — GABAPENTIN 300 MG PO CAPS: ORAL_CAPSULE | ORAL | 3 refills | Status: DC

## 2020-03-01 ENCOUNTER — Other Ambulatory Visit: Payer: Self-pay | Admitting: Family Medicine

## 2020-03-01 ENCOUNTER — Encounter: Payer: Self-pay | Admitting: Family Medicine

## 2020-03-01 MED ORDER — AZITHROMYCIN 250 MG PO TABS
ORAL_TABLET | ORAL | 0 refills | Status: AC
Start: 2020-03-01 — End: 2020-03-06

## 2020-03-01 MED FILL — GABAPENTIN 300 MG CAPSULE: 300 | 90 days supply | Qty: 270 | Fill #0

## 2020-03-02 ENCOUNTER — Encounter: Payer: Self-pay | Admitting: Family Medicine

## 2020-03-02 LAB — NOVEL CORONAVIRUS, NAA: SARS-CoV-2, NAA: DETECTED — AB

## 2020-03-02 LAB — SARS-COV-2, NAA 2 DAY TAT

## 2020-03-03 ENCOUNTER — Telehealth (HOSPITAL_COMMUNITY): Payer: Self-pay | Admitting: Nurse Practitioner

## 2020-03-03 ENCOUNTER — Encounter: Payer: Self-pay | Admitting: Nurse Practitioner

## 2020-03-03 DIAGNOSIS — U071 COVID-19: Secondary | ICD-10-CM

## 2020-03-03 NOTE — Telephone Encounter (Signed)
Called to Discuss with patient about Covid symptoms and the use of regeneron, a monoclonal antibody infusion for those with mild to moderate Covid symptoms and at a high risk of hospitalization.     Pt is qualified for this infusion at the [facility] infusion center due to co-morbid conditions and/or a member of an at-risk group.     Unable to reach pt. Left message to return call. Sent mychart message.   Lilliam Chamblee, DNP, AGNP-C [phone] (Infusion Center Hotline)  

## 2020-03-04 ENCOUNTER — Telehealth: Payer: Self-pay | Admitting: Nurse Practitioner

## 2020-03-04 DIAGNOSIS — U071 COVID-19: Secondary | ICD-10-CM

## 2020-03-04 NOTE — Telephone Encounter (Signed)
Received [employer]. Returned patient's call. Unable to reach patient. Left message to return call.   Beckey Rutter, [place], AGNP-C [phone] ([place])

## 2020-03-05 ENCOUNTER — Encounter: Payer: Self-pay | Admitting: Family Medicine

## 2020-03-14 ENCOUNTER — Encounter (INDEPENDENT_AMBULATORY_CARE_PROVIDER_SITE_OTHER): Payer: Self-pay | Admitting: Physician Assistant

## 2020-03-19 ENCOUNTER — Telehealth (INDEPENDENT_AMBULATORY_CARE_PROVIDER_SITE_OTHER): Payer: Self-pay

## 2020-03-22 ENCOUNTER — Encounter: Payer: Self-pay | Admitting: Emergency Medicine

## 2020-03-22 ENCOUNTER — Emergency Department (INDEPENDENT_AMBULATORY_CARE_PROVIDER_SITE_OTHER)
Admission: EM | Admit: 2020-03-22 | Discharge: 2020-03-22 | Disposition: A | Discharge status: Home / Self Care | Payer: 59 | Source: Home / Self Care

## 2020-03-22 ENCOUNTER — Emergency Department (INDEPENDENT_AMBULATORY_CARE_PROVIDER_SITE_OTHER): Payer: 59

## 2020-03-22 ENCOUNTER — Other Ambulatory Visit: Payer: Self-pay

## 2020-03-22 DIAGNOSIS — R42 Dizziness and giddiness: Secondary | ICD-10-CM

## 2020-03-22 DIAGNOSIS — R002 Palpitations: Secondary | ICD-10-CM

## 2020-03-22 DIAGNOSIS — R5383 Other fatigue: Secondary | ICD-10-CM

## 2020-03-22 DIAGNOSIS — I493 Ventricular premature depolarization: Secondary | ICD-10-CM

## 2020-03-22 LAB — POCT CBC W AUTO DIFF (K'VILLE URGENT CARE)

## 2020-03-22 NOTE — Discharge Instructions (Addendum)
  Please call to schedule a follow up appointment with your primary care provider next week for further evaluation and treatment of your symptoms. You may want to try to schedule an appointment with the [facility] to see if they can offer any additional treatment recommendations.  Your chest x-ray did she low lung volumes but no bronchitis or pneumonia. You may try using the incentive spirometer provided today to see if it helps your symptoms.   Call 911 or have someone drive you to the hospital if symptoms significantly worsening.

## 2020-03-22 NOTE — ED Triage Notes (Signed)
Palpitations x 1 week had Covid on the 18th

## 2020-03-22 NOTE — ED Provider Notes (Signed)
[facility]    CSN: [id number] Arrival date & time: 03/22/20  1156      History   Chief Complaint Chief Complaint  Patient presents with  . Palpitations    HPI Kathy Clark is a 59 y.o. female.   HPI Kathy Clark is a 59 y.o. female presenting to UC with c/o palpitations for 1 week. Reports being diagnosed with COVID on August 18th.  Pt fully vaccinated with [place] vaccine in January 2021. Pt has continued to feel fatigued since being diagnosed with COVID and states she feels lightheaded at times when she gets up.  Denies HA or dizziness at this time. Denies chest pain but feels like her heart races at times. No SOB.  Denies fever, chills, n/v/d.    Past Medical History:  Diagnosis Date  . Anxiety   . Back pain   . 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 Depression   . Essential hypertension, benign   . Gastritis   . GERD (gastroesophageal reflux disease)   . Hypertension   . Joint pain   . Lymphocytic colitis    microscopic  . Migraines   . Osteoarthritis   . Sciatica    right leg  . Swelling    feet, left foot    Patient Active Problem List   Diagnosis Date Noted  . Bursitis of left shoulder 02/08/2020  . Annual physical exam 01/10/2020  . Sleeping difficulties 01/10/2020  . Rheumatoid arthritis of multiple sites with negative rheumatoid factor ([place]) 12/04/2019  . Polyarthralgia 11/02/2019  . Myofascial pain 11/02/2019  . Arthralgia of both hands 10/03/2019  . Metatarsalgia of left foot 01/05/2018  . Other hyperlipidemia 03/23/2017  . OSA (obstructive sleep apnea) 02/25/2017  . Class 1 obesity without serious comorbidity with body mass index (BMI) of 34.0 to 34.9 in adult 08/17/2016  . Elevated liver function tests 08/17/2016  . Vitamin D deficiency 08/03/2016  . Left tennis elbow 04/06/2016  . 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 Depression 08/23/2015  . Subacromial bursitis 04/23/2015  . Left carpal tunnel  syndrome, post right carpal tunnel release 12/24/2014  . Trochanteric bursitis of right hip 04/21/2013  . Postop Mild Hyponatremia 07/30/2011  . OSTEOARTHRITIS, KNEE, LEFT 12/12/2009  . MIGRAINE WITH AURA 05/31/2008  . SKIN CANCER, HX OF 01/06/2008  . COLITIS, HX OF 01/06/2008  . ALLERGIC RHINITIS 05/20/2007  . HYPERTENSION, BENIGN ESSENTIAL 11/23/2006  . SCIATICA, CHRONIC 05/19/2006    Past Surgical History:  Procedure Laterality Date  . ABDOMINAL HYSTERECTOMY  07-2005  . [place] Bunionectomy Left 09/18/2016   Left Foot  . BACK SURGERY    . KNEE SURGERY  2006   left; multiple  . microdiscetomy  2005  . SHOULDER SURGERY  07-22-09   right  . SKIN CANCER EXCISION     eyelid  . TOTAL KNEE ARTHROPLASTY  07/27/2011   Procedure: TOTAL KNEE ARTHROPLASTY;  Surgeon: Gearlean Alf;  Location: [facility];  Service: Orthopedics;  Laterality: Left;    OB History    Gravida  3   Para  2   Term      Preterm      AB      Living        SAB      TAB      Ectopic      Multiple      Live Births  Home Medications    Prior to Admission medications   Medication Sig Start Date End Date Taking? Authorizing Provider  AMBULATORY NON FORMULARY MEDICATION Medication Name: Please decrease CPAP setting to 8 cm of water pressure and fax [place] a download after 5 days.  Is having a lot of difficulty with air leaks is working to try to reduce her pressure to see if she is still being adequately treated but with fewer leaks.FAx to [place] 12/10/17   Hali Marry, MD  buPROPion [facility] SR) 150 MG 12 hr tablet Take 1 tablet (150 mg total) by mouth in the morning. 02/01/20   Abby Potash, PA-C  Calcium Carbonate-Vitamin D (CALTRATE 600+D PO) Take 1 tablet by mouth daily.    [provider]  celecoxib (CELEBREX) 100 MG capsule Take 1 capsule (100 mg total) by mouth 2 (two) times daily as needed. 02/08/20   Hali Marry, MD  cyclobenzaprine  (FLEXERIL) 5 MG tablet Take 1 tablet (5 mg total) by mouth at bedtime as needed for muscle spasms. 12/04/19   Hali Marry, MD  EPINEPHrine 0.3 mg/0.3 mL IJ SOAJ injection Inject 0.3 mLs (0.3 mg total) into the muscle as needed for anaphylaxis. 12/29/19   Hali Marry, MD  estradiol (VIVELLE-DOT) 0.075 MG/24HR  09/15/16   [provider]  folic acid (FOLVITE) 1 MG tablet Take 1 mg by mouth daily. 11/29/19   Posey Pronto, Mayur K, MD  gabapentin (NEURONTIN) 300 MG capsule 1 caps PO QAM and 2 PO QH 02/29/20   Hali Marry, MD  hydrochlorothiazide (HYDRODIURIL) 25 MG tablet Take 1 tablet (25 mg total) by mouth daily. 10/16/19   Hali Marry, MD  losartan (COZAAR) 25 MG tablet TAKE 1 TABLET BY MOUTH DAILY. 10/12/19   Hali Marry, MD  Melatonin 3 MG TABS Take by mouth as directed.    [provider]  methotrexate (RHEUMATREX) 2.5 MG tablet Take 8 tablets by mouth once a week.  11/29/19   Posey Pronto, Mayur K, MD  Multiple Vitamins-Minerals (MULTIVITAMIN ADULT PO) Take by mouth daily.    [provider]  nabumetone (RELAFEN) 500 MG tablet Take 1 tablet (500 mg total) by mouth daily as needed for moderate pain. 12/04/19   Hali Marry, MD  ondansetron (ZOFRAN) 4 MG tablet Take 1 tablet (4 mg total) by mouth every 8 (eight) hours as needed for nausea or vomiting. 12/02/19   Hassell Done, Mary-Margaret, FNP  SUMAtriptan (IMITREX) 20 MG/ACT nasal spray PLACE 1 SPRAY INTO THE NOSE EVERY 2 HOURS AS NEEDED FOR. HEADACHE 08/08/18   Hali Marry, MD  Vilazodone HCl (VIIBRYD) 40 MG TABS Take 1 tablet (40 mg total) by mouth daily. 10/03/19   Hali Marry, MD    Family History Family History  Problem Relation Age of Onset  . Breast cancer Other   . Tongue cancer Mother   . Breast cancer Mother   . Anxiety disorder Mother   . Depression Mother   . Heart disease Father   . Hypertension Father   . Hyperlipidemia Father   . Stroke Paternal  Grandmother   . Colon cancer Neg Hx     Social History Social History   Tobacco Use  . Smoking status: Never Smoker  . Smokeless tobacco: Never Used  Vaping Use  . Vaping Use: Never used  Substance Use Topics  . Alcohol use: Yes    Comment: occasionally  . Drug use: No     Allergies  Succinylcholine, Dilaudid [hydromorphone hcl], and Nsaids   Review of Systems Review of Systems  Constitutional: Positive for fatigue. Negative for chills and fever.  HENT: Negative for congestion, ear pain, sore throat, trouble swallowing and voice change.   Respiratory: Negative for cough and shortness of breath.   Cardiovascular: Positive for palpitations. Negative for chest pain.  Gastrointestinal: Negative for abdominal pain, diarrhea, nausea and vomiting.  Musculoskeletal: Negative for arthralgias, back pain and myalgias.  Skin: Negative for rash.  Neurological: Positive for dizziness (episodic with some movements) and light-headedness. Negative for headaches.  All other systems reviewed and are negative.    Physical Exam Triage Vital Signs ED Triage Vitals  Enc Vitals Group     BP      Pulse      Resp      Temp      Temp src      SpO2      Weight      Height      Head Circumference      Peak Flow      Pain Score      Pain Loc      Pain Edu?      Excl. in [place]?    No data found.  Updated Vital Signs BP 107/77 (BP Location: Right Arm)   Pulse 72   Temp 97.9 F (36.6 C) (Oral)   Ht 5\' 8"  (1.727 m)   Wt 225 lb (102.1 kg)   LMP  (LMP Unknown) Comment: hystrectomy  SpO2 98%   BMI 34.21 kg/m   Visual Acuity Right Eye Distance:   Left Eye Distance:   Bilateral Distance:    Right Eye Near:   Left Eye Near:    Bilateral Near:     Physical Exam Vitals and nursing note reviewed.  Constitutional:      General: She is not in acute distress.    Appearance: Normal appearance. She is well-developed. She is not ill-appearing, toxic-appearing or diaphoretic.  HENT:       Head: Normocephalic and atraumatic.     Right Ear: Tympanic membrane and ear canal normal.     Left Ear: Tympanic membrane and ear canal normal.     Nose: Nose normal.     Mouth/Throat:     Mouth: Mucous membranes are moist.     Pharynx: Oropharynx is clear.  Eyes:     Extraocular Movements: Extraocular movements intact.     Pupils: Pupils are equal, round, and reactive to light.  Cardiovascular:     Rate and Rhythm: Normal rate and regular rhythm.  Pulmonary:     Effort: Pulmonary effort is normal. No respiratory distress.     Breath sounds: Normal breath sounds. No stridor. No wheezing, rhonchi or rales.  Musculoskeletal:        General: Normal range of motion.     Cervical back: Normal range of motion and neck supple.  Skin:    General: Skin is warm and dry.     Capillary Refill: Capillary refill takes less than 2 seconds.  Neurological:     General: No focal deficit present.     Mental Status: She is alert and oriented to person, place, and time.     Cranial Nerves: No cranial nerve deficit.     Sensory: No sensory deficit.     Motor: No weakness.     Coordination: Coordination normal.     Gait: Gait normal.  Psychiatric:  Mood and Affect: Mood normal.        Behavior: Behavior normal.      UC Treatments / Results  Labs (all labs ordered are listed, but only abnormal results are displayed) Labs Reviewed  COMPLETE METABOLIC PANEL WITH GFR - Abnormal; Notable for the following components:      Result Value   AST 42 (*)    ALT 64 (*)    All other components within normal limits  POCT CBC W AUTO DIFF ([facility])    EKG Date/Time:03/22/2020   12:27:36 Ventricular Rate: 73  PR Interval: 172 QRS Duration: 90 QT Interval: 388 QTC Calculation: 427 P-R-T axes: 48    37    39 Text Interpretation: Normal sinus rhythm. Normal ECG    Radiology DG Chest 2 View  Result Date: 03/22/2020 CLINICAL DATA:  Fatigue.  History of COVID. EXAM:  CHEST - 2 VIEW COMPARISON:  Six 14 2017. FINDINGS: Mediastinum hilar structures normal. Heart size normal. Low lung volumes. No focal infiltrate. No pleural effusion or pneumothorax. Degenerative change thoracic spine. IMPRESSION: Low lung volumes.  No focal infiltrate identified. Electronically Signed   By: Marcello Moores  Register   On: 03/22/2020 14:11    Procedures Procedures (including critical care time)  Medications Ordered in UC Medications - No data to display  Initial Impression / Assessment and Plan / UC Course  I have reviewed the triage vital signs and the nursing notes.  Pertinent labs & imaging results that were available during my care of the patient were reviewed by me and considered in my medical decision making (see chart for details).     Reassured pt of normal EKG and CXR While performing EKG, a short rhythm strip was performed and 1 PVC was noted. Advised pt, an occasional PVC is okay, it can be caused by several factors, however, encouraged f/u with PCP who may be able to order a cardia event monitor or refer to cardiology for further evaluation of palpitations. Pt not orthostatic while in UC  Encouraged good hydration, rest.  Discuss possible referral to post-COVID clinic with PCP Discussed symptoms that warrant emergent care in the ED. AVS given  Final Clinical Impressions(s) / UC Diagnoses   Final diagnoses:  Palpitations  Episodic lightheadedness  Fatigue, unspecified type  PVC (premature ventricular contraction)     Discharge Instructions      Please call to schedule a follow up appointment with your primary care provider next week for further evaluation and treatment of your symptoms. You may want to try to schedule an appointment with the [facility] to see if they can offer any additional treatment recommendations.  Your chest x-ray did she low lung volumes but no bronchitis or pneumonia. You may try using the incentive spirometer provided  today to see if it helps your symptoms.   Call 911 or have someone drive you to the hospital if symptoms significantly worsening.     ED Prescriptions    None     PDMP not reviewed this encounter.   Noe Gens, [place] 03/23/20 2332

## 2020-03-23 ENCOUNTER — Ambulatory Visit: Payer: Self-pay

## 2020-03-23 ENCOUNTER — Telehealth: Payer: Self-pay | Admitting: Family Medicine

## 2020-03-23 ENCOUNTER — Encounter: Payer: Self-pay | Admitting: Family Medicine

## 2020-03-23 LAB — COMPLETE METABOLIC PANEL WITH GFR
AG Ratio: 1.7 (calc) (ref 1.0–2.5)
ALT: 64 U/L — ABNORMAL HIGH (ref 6–29)
AST: 42 U/L — ABNORMAL HIGH (ref 10–35)
Albumin: 4.3 g/dL (ref 3.6–5.1)
Alkaline phosphatase (APISO): 64 U/L (ref 37–153)
BUN: 13 mg/dL (ref 7–25)
CO2: 31 mmol/L (ref 20–32)
Calcium: 9.6 mg/dL (ref 8.6–10.4)
Chloride: 101 mmol/L (ref 98–110)
Creat: 0.7 mg/dL (ref 0.50–0.99)
GFR, Est African American: 109 mL/min/{1.73_m2} (ref >=60)
GFR, Est Non African American: 94 mL/min/{1.73_m2} (ref >=60)
Globulin: 2.5 g/dL (calc) (ref 1.9–3.7)
Glucose, Bld: 85 mg/dL (ref 65–99)
Potassium: 4.4 mmol/L (ref 3.5–5.3)
Sodium: 140 mmol/L (ref 135–146)
Total Bilirubin: 0.6 mg/dL (ref 0.2–1.2)
Total Protein: 6.8 g/dL (ref 6.1–8.1)

## 2020-03-23 NOTE — Telephone Encounter (Signed)
Pt c/o episodes of skipped heart beats. Pt stated they occur when she stands up. Pt stated she is having fatigue. Initial dizziness that subsides after "30 seconds." Pt c/o SOB with exertion. Pt is post covid infection and she was seen at [facility] yesterday. Pt stated that she is worried and should she be seen sooner than Thursday.  Denies chest pain or sweating. Pt stated her HR goes to 47-49 when up walking during the palpitations. Informed pt that is because some of the beats do not perfuse enough to register on pulse ox.  Pt interested in Post covid clinic. Advised pt that her PC would have to refer her to the clinic. Post covid clinic number is [phone]. Care advice discussed with pt and pt verbalized understanding.  Routing triage note to PCP. Reason for Disposition . Palpitations are a chronic symptom (recurrent or ongoing AND present > 4 weeks)  Answer Assessment - Initial Assessment Questions 1. DESCRIPTION: "Please describe your heart rate or heartbeat that you are having" (e.g., fast/slow, regular/irregular, skipped or extra beats, "palpitations")     Skipped beats 2. ONSET: "When did it start?" (Minutes, hours or days)       During covid over a week 3. DURATION: "How long does it last" (e.g., seconds, minutes, hours)       4. PATTERN "Does it come and go, or has it been constant since it started?"  "Does it get worse with exertion?"   "Are you feeling it now?"     Comes and goes and worse with exertion.- laying down so not feeling any 5. TAP: "Using your hand, can you tap out what you are feeling on a chair or table in front of you, so that I can hear?" (Note: not all patients can do this)       irregular 6. HEART RATE: "Can you tell me your heart rate?" "How many beats in 15 seconds?"  (Note: not all patients can do this)       Irregular 47-49 when up walking  7. RECURRENT SYMPTOM: "Have you ever had this before?" If Yes, ask: "When was the last time?" and "What happened that  time?"    no 8. CAUSE: "What do you think is causing the palpitations?"     Started with dx with covd and on methotrexate for RA 9. CARDIAC HISTORY: "Do you have any history of heart disease?" (e.g., heart attack, angina, bypass surgery, angioplasty, arrhythmia)      no 10. OTHER SYMPTOMS: "Do you have any other symptoms?" (e.g., dizziness, chest pain, sweating, difficulty breathing)       Fatigue and dizziness when standing up or walking -eases after 30 seconds (dizziness), SOB with exertion occasional headaches 11. PREGNANCY: "Is there any chance you are pregnant?" "When was your last menstrual period?" n/a  Protocols used: HEART RATE AND HEARTBEAT QUESTIONS-A-AH

## 2020-03-23 NOTE — Telephone Encounter (Signed)
Please see triage note

## 2020-03-23 NOTE — Telephone Encounter (Signed)
Pt is calling and went to urgent care yesterday for irregular heart rate and fatigue. Pt has video appt with dr [name] on thur 03/28/2020. Pt is concern and would like some advise

## 2020-03-25 DIAGNOSIS — M0609 Rheumatoid arthritis without rheumatoid factor, multiple sites: Secondary | ICD-10-CM | POA: Diagnosis not present

## 2020-03-25 DIAGNOSIS — D84821 Immunodeficiency due to drugs: Secondary | ICD-10-CM | POA: Diagnosis not present

## 2020-03-25 DIAGNOSIS — Z79899 Other long term (current) drug therapy: Secondary | ICD-10-CM | POA: Diagnosis not present

## 2020-03-25 NOTE — Telephone Encounter (Signed)
Patient called and wanted Dr [name] to look over her urgent care notes from last week and see when is appropriate for her to follow up  Currently scheduled for virtual on Thursday

## 2020-03-25 NOTE — Telephone Encounter (Signed)
Appointment has been made. No further questions at this time.  

## 2020-03-25 NOTE — Telephone Encounter (Signed)
OK to put in acute for tomorrow.

## 2020-03-26 ENCOUNTER — Encounter: Payer: Self-pay | Admitting: Family Medicine

## 2020-03-26 ENCOUNTER — Ambulatory Visit (INDEPENDENT_AMBULATORY_CARE_PROVIDER_SITE_OTHER): Payer: 59 | Admitting: Family Medicine

## 2020-03-26 VITALS — BP 123/86 | HR 81 | Ht 67.0 in | Wt 233.0 lb

## 2020-03-26 DIAGNOSIS — R519 Headache, unspecified: Secondary | ICD-10-CM

## 2020-03-26 DIAGNOSIS — R42 Dizziness and giddiness: Secondary | ICD-10-CM

## 2020-03-26 DIAGNOSIS — R002 Palpitations: Secondary | ICD-10-CM

## 2020-03-26 NOTE — Progress Notes (Signed)
Established Patient Office Visit  Subjective:  Patient ID: Kathy Clark, female    DOB: January 03, 1961  Age: 59 y.o. MRN: [id number]  CC:  Chief Complaint  Patient presents with  . Palpitations      HPI [name] presents for palpitations.  She was actually diagnosed with Covid on August 18.  While she was actively having Covid symptoms she remembers feeling a little lightheaded when she would get up to go to the bathroom etc.  But since recovering she has noticed that she is short of breath with activity.  If she changes position from lying to sitting, sitting to standing she will get a little "woozy".  And feel lightheaded just for a few seconds.  She had been fully vaccinated with Pfizer prior to getting sick.  She is also been getting most a flushing type headache where it will come on all of a sudden and can last anywhere from just a couple of minutes to a little longer to the point that she has actually has to take Tylenol.  Recently she has been having a little bit of left lower sternal pain that just feels like a little twinge.  Is not triggered by activity.  She denies any extremity swelling.  She also gets a little nauseated sometimes especially with the feeling lightheaded sensation sometimes they occur together.  She has had 2 occasions where she knows for fact that her oxygen was dropping into the 80s with ambulation 1 was at the urgent care next-door.  Describes the palpitations as sometimes feeling like her heart is skipping.  She says it seems to be better when she is lying down or resting.  She was given a peak flow meter at the urgent care she was seen for these symptoms on September 10.  She says she has been able to blow to about 400 on the peak flow meter but it is not as high as she should be able to low based on age and height.  In the urgent care her CBC was normal.  Metabolic panel was also normal though her liver enzymes were mildly elevated, AST of 42 and ALT of 64.   Rheumatologist is having her hold her methotrexate for 2 more weeks and then planning to recheck her liver enzymes then.  Has a family history of atrial fibrillation in her brother and her mother.  Past Medical History:  Diagnosis Date  . Anxiety   . Back pain   . 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 Depression   . Essential hypertension, benign   . Gastritis   . GERD (gastroesophageal reflux disease)   . Hypertension   . Joint pain   . Lymphocytic colitis    microscopic  . Migraines   . Osteoarthritis   . Sciatica    right leg  . Swelling    feet, left foot    Past Surgical History:  Procedure Laterality Date  . ABDOMINAL HYSTERECTOMY  07-2005  . [place] Bunionectomy Left 09/18/2016   Left Foot  . BACK SURGERY    . KNEE SURGERY  2006   left; multiple  . microdiscetomy  2005  . SHOULDER SURGERY  07-22-09   right  . SKIN CANCER EXCISION     eyelid  . TOTAL KNEE ARTHROPLASTY  07/27/2011   Procedure: TOTAL KNEE ARTHROPLASTY;  Surgeon: Gearlean Alf;  Location: [facility];  Service: Orthopedics;  Laterality: Left;    Family History  Problem Relation Age of Onset  . Breast cancer Other   . Tongue cancer Mother   . Breast cancer Mother   . Anxiety disorder Mother   . Depression Mother   . Heart disease Father   . Hypertension Father   . Hyperlipidemia Father   . Stroke Paternal Grandmother   . Colon cancer Neg Hx     Social History   Socioeconomic History  . Marital status: Married    Spouse name: Jenny Reichmann  . Number of children: 2  . Years of education: Not on file  . Highest education level: Not on file  Occupational History  . Occupation: Programmer, multimedia: [employer]  Tobacco Use  . Smoking status: Never Smoker  . Smokeless tobacco: Never Used  Vaping Use  . Vaping Use: Never used  Substance and Sexual Activity  . Alcohol use: Yes    Comment: occasionally  . Drug use: No  . 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  Other Topics Concern  . Not on file  Social History Narrative  . Not on file   Social Determinants of Health   Financial Resource Strain:   . Difficulty of Paying Living Expenses: Not on file  Food Insecurity:   . Worried About Charity fundraiser in the Last Year: Not on file  . Ran Out of Food in the Last Year: Not on file  Transportation Needs:   . Lack of Transportation (Medical): Not on file  . Lack of Transportation (Non-Medical): Not on file  Physical Activity:   . Days of Exercise per Week: Not on file  . Minutes of Exercise per Session: Not on file  Stress:   . Feeling of Stress : Not on file  Social Connections:   . Frequency of Communication with Friends and Family: Not on file  . Frequency of Social Gatherings with Friends and Family: Not on file  . Attends Religious Services: Not on file  . Active Member of Clubs or Organizations: Not on file  . Attends Archivist Meetings: Not on file  . Marital Status: Not on file  Intimate Partner Violence:   . Fear of Current or Ex-Partner: Not on file  . Emotionally Abused: Not on file  . Physically Abused: Not on file  . Sexually Abused: Not on file    Outpatient Medications Prior to Visit  Medication Sig Dispense Refill  . AMBULATORY NON FORMULARY MEDICATION Medication Name: Please decrease CPAP setting to 8 cm of water pressure and fax [place] a download after 5 days.  Is having a lot of difficulty with air leaks is working to try to reduce her pressure to see if she is still being adequately treated but with fewer leaks.FAx to [place] 1 Units 0  . Calcium Carbonate-Vitamin D (CALTRATE 600+D PO) Take 1 tablet by mouth daily.    . cyclobenzaprine (FLEXERIL) 5 MG tablet Take 1 tablet (5 mg total) by mouth at bedtime as needed for muscle spasms. 30 tablet 3  . EPINEPHrine 0.3 mg/0.3 mL IJ SOAJ injection Inject 0.3 mLs (0.3 mg total)  into the muscle as needed for anaphylaxis. 2 each prn  . estradiol (VIVELLE-DOT) 0.075 FI/43PI   3  . folic acid (FOLVITE) 1 MG tablet Take 1 mg by mouth daily.    [name] gabapentin (NEURONTIN) 300 MG capsule 1 caps PO QAM and 2 PO  QH 270 capsule 3  . hydrochlorothiazide (HYDRODIURIL) 25 MG tablet Take 1 tablet (25 mg total) by mouth daily. 90 tablet 1  . losartan (COZAAR) 25 MG tablet TAKE 1 TABLET BY MOUTH DAILY. 90 tablet 2  . Melatonin 3 MG TABS Take by mouth as directed.    . methotrexate (RHEUMATREX) 2.5 MG tablet Take 8 tablets by mouth once a week.     . Multiple Vitamins-Minerals (MULTIVITAMIN ADULT PO) Take by mouth daily.    . ondansetron (ZOFRAN) 4 MG tablet Take 1 tablet (4 mg total) by mouth every 8 (eight) hours as needed for nausea or vomiting. 20 tablet 0  . SUMAtriptan (IMITREX) 20 MG/ACT nasal spray PLACE 1 SPRAY INTO THE NOSE EVERY 2 HOURS AS NEEDED FOR. HEADACHE 6 Inhaler 2  . Vilazodone HCl (VIIBRYD) 40 MG TABS Take 1 tablet (40 mg total) by mouth daily. 90 tablet 1  . buPROPion (WELLBUTRIN SR) 150 MG 12 hr tablet Take 1 tablet (150 mg total) by mouth in the morning. 30 tablet 0  . celecoxib (CELEBREX) 100 MG capsule Take 1 capsule (100 mg total) by mouth 2 (two) times daily as needed. 60 capsule 2  . nabumetone (RELAFEN) 500 MG tablet Take 1 tablet (500 mg total) by mouth daily as needed for moderate pain. 30 tablet 3   No facility-administered medications prior to visit.    Allergies  Allergen Reactions  . Succinylcholine Anaphylaxis  . Dilaudid [Hydromorphone Hcl] Nausea And Vomiting  . Nsaids Other (See Comments)    Trigger colits    ROS Review of Systems    Objective:    Physical Exam Constitutional:      Appearance: She is well-developed.  HENT:     Head: Normocephalic and atraumatic.  Neck:     Comments: No carotid bruits. Cardiovascular:     Rate and Rhythm: Normal rate and regular rhythm.     Heart sounds: Normal heart sounds.     Comments: Few  skipped beats on exam.  Examined sitting up and lying flat. Pulmonary:     Effort: Pulmonary effort is normal.     Breath sounds: Normal breath sounds.  Musculoskeletal:        General: No swelling.  Skin:    General: Skin is warm and dry.  Neurological:     Mental Status: She is alert and oriented to person, place, and time.  Psychiatric:        Behavior: Behavior normal.     BP 123/86   Pulse 81   Ht 5\' 7"  (1.702 m)   Wt 233 lb (105.7 kg)   LMP  (LMP Unknown) Comment: hystrectomy  SpO2 100%   BMI 36.49 kg/m  Wt Readings from Last 3 Encounters:  03/26/20 233 lb (105.7 kg)  03/22/20 225 lb (102.1 kg)  02/08/20 (!) 233 lb (105.7 kg)     Health Maintenance Due  Topic Date Due  . INFLUENZA VACCINE  02/11/2020    There are no preventive care reminders to display for this patient.  Lab Results  Component Value Date   TSH 2.61 10/03/2019   Lab Results  Component Value Date   WBC 7.1 10/03/2019   HGB 13.7 10/03/2019   HCT 40.9 10/03/2019   MCV 89.5 10/03/2019   PLT 343 10/03/2019   Lab Results  Component Value Date   NA 140 03/22/2020   K 4.4 03/22/2020   CO2 31 03/22/2020   GLUCOSE 85 03/22/2020   BUN 13 03/22/2020  CREATININE 0.70 03/22/2020   BILITOT 0.6 03/22/2020   ALKPHOS 66 11/08/2019   AST 42 (H) 03/22/2020   ALT 64 (H) 03/22/2020   PROT 6.8 03/22/2020   ALBUMIN 4.3 11/08/2019   CALCIUM 9.6 03/22/2020   Lab Results  Component Value Date   CHOL 201 (H) 11/08/2019   Lab Results  Component Value Date   HDL 51 11/08/2019   Lab Results  Component Value Date   LDLCALC 132 (H) 11/08/2019   Lab Results  Component Value Date   TRIG 100 11/08/2019   Lab Results  Component Value Date   CHOLHDL 3.9 11/08/2019   Lab Results  Component Value Date   HGBA1C 5.2 11/08/2019      Assessment & Plan:   Problem List Items Addressed This Visit    None    Visit Diagnoses    Palpitations    -  Primary   Relevant Orders   ECHOCARDIOGRAM  COMPLETE   LONG TERM MONITOR-LIVE TELEMETRY (3-14 DAYS)   Lightheadedness       Relevant Orders   ECHOCARDIOGRAM COMPLETE   LONG TERM MONITOR-LIVE TELEMETRY (3-14 DAYS)   Nonintractable headache, unspecified chronicity pattern, unspecified headache type          Palpitations after having had Covid recently we did do an EKG today which just confirmed PVCs.  Rate of 79 bpm.  Recommend further work-up including echocardiogram and heart monitor to rule out arrhythmia or structural abnormalities such as cardiomyopathy or pericarditis after having had Covid.  In the meantime recommend that she take her time changing position and stay well-hydrated.  Shortness of breath with activity-we did do a 6-minute walk test to make sure that she was not dropping she actually did fairly well.  We will continue to keep an eye on that.  Evaded liver enzymes she is having that rechecked about 2 weeks after skipping her methotrexate.  Headache-it only sounds like a flushing type headache that is usually fairly brief.  Could be related to elevations in blood pressure or even drop in blood pressure with position change.  He had negative orthostatics today.  EKG shows rate of 79 bpm, normal sinus rhythm with a PVC.  No significant ST-T wave abnormalities.  No orders of the defined types were placed in this encounter.   Follow-up: Return if symptoms worsen or fail to improve.   Time spent 35 min in encounter  Beatrice Lecher, MD

## 2020-03-26 NOTE — Progress Notes (Signed)
She reports that she gets SOB w/exertion. She also gets "woozy"

## 2020-03-27 ENCOUNTER — Telehealth: Payer: Self-pay

## 2020-03-27 NOTE — Telephone Encounter (Signed)
Left msg to let Dr [name] know that she had an episode of lightheadedness and feeling unwell after her shower this morning. Checked some vitals and O2 was 86, pulse was 49.  Patient state she laid down in her bed and stats almost immediately improved to O2 of 95 and pulse 74.

## 2020-03-27 NOTE — Telephone Encounter (Signed)
Faxed Echo order to [facility] Scheduling at Fax [phone] and Ph is [phone]-- requested they schedule with patient for [facility]

## 2020-03-27 NOTE — Telephone Encounter (Signed)
Dr [name]: Patient advised. Has received call for heart monitor but has to wait to apply until after her echo.    Kathy Clark: Patient has [facility], no auth req for echo. Can we make sure she gets set up ASAP? Thanks!

## 2020-03-27 NOTE — Telephone Encounter (Signed)
K, thank you for letting me know.  I ordered an echocardiogram and heart monitor yesterday.  Hopefully we can get the scheduled ASAP.  Not sure who is working on these as I know they do not show up in the typical work you but they were printed and placed in the box yesterday.

## 2020-03-28 ENCOUNTER — Telehealth: Payer: 59 | Admitting: Family Medicine

## 2020-03-29 ENCOUNTER — Other Ambulatory Visit: Payer: Self-pay

## 2020-03-29 ENCOUNTER — Ambulatory Visit (HOSPITAL_COMMUNITY)
Admission: RE | Admit: 2020-03-29 | Discharge: 2020-03-29 | Disposition: A | Discharge status: Home / Self Care | Payer: 59 | Source: Ambulatory Visit | Attending: Family Medicine | Admitting: Family Medicine

## 2020-03-29 DIAGNOSIS — R002 Palpitations: Secondary | ICD-10-CM | POA: Diagnosis not present

## 2020-03-29 DIAGNOSIS — R42 Dizziness and giddiness: Secondary | ICD-10-CM | POA: Diagnosis not present

## 2020-03-29 DIAGNOSIS — I493 Ventricular premature depolarization: Secondary | ICD-10-CM | POA: Diagnosis not present

## 2020-03-29 LAB — ECHOCARDIOGRAM COMPLETE
Area-P 1/2: 5.27 cm2
S' Lateral: 3.6 cm

## 2020-03-29 NOTE — Progress Notes (Signed)
  Echocardiogram 2D Echocardiogram has been performed.  Kathy Clark 03/29/2020, 2:52 PM

## 2020-04-02 ENCOUNTER — Ambulatory Visit (INDEPENDENT_AMBULATORY_CARE_PROVIDER_SITE_OTHER): Payer: 59

## 2020-04-02 ENCOUNTER — Other Ambulatory Visit: Payer: Self-pay | Admitting: Family Medicine

## 2020-04-02 DIAGNOSIS — R42 Dizziness and giddiness: Secondary | ICD-10-CM

## 2020-04-02 DIAGNOSIS — R002 Palpitations: Secondary | ICD-10-CM

## 2020-04-02 MED FILL — VIIBRYD 40 MG TABLET: 40 | 90 days supply | Qty: 90 | Fill #0

## 2020-04-02 NOTE — Addendum Note (Signed)
Addended by: Narda Rutherford on: 04/02/2020 08:09 AM   Modules accepted: Orders

## 2020-04-03 DIAGNOSIS — R002 Palpitations: Secondary | ICD-10-CM | POA: Diagnosis not present

## 2020-04-05 NOTE — Telephone Encounter (Signed)
Checked on cover my meds for determination and received message medication is a covered benefit and does not require a prior authorization. - CF

## 2020-04-08 DIAGNOSIS — M0609 Rheumatoid arthritis without rheumatoid factor, multiple sites: Secondary | ICD-10-CM | POA: Diagnosis not present

## 2020-04-08 DIAGNOSIS — Z79899 Other long term (current) drug therapy: Secondary | ICD-10-CM | POA: Diagnosis not present

## 2020-04-16 ENCOUNTER — Other Ambulatory Visit (HOSPITAL_COMMUNITY): Payer: Self-pay | Admitting: Specialist

## 2020-04-16 MED FILL — HYDROXYCHLOROQUINE 200 MG T: 200 | 30 days supply | Qty: 60 | Fill #0

## 2020-04-16 MED FILL — METHYLPREDNISOLONE 4 MG TBP: 4 | 6 days supply | Qty: 21 | Fill #0

## 2020-04-17 ENCOUNTER — Other Ambulatory Visit (HOSPITAL_BASED_OUTPATIENT_CLINIC_OR_DEPARTMENT_OTHER): Payer: 59

## 2020-04-18 DIAGNOSIS — G4733 Obstructive sleep apnea (adult) (pediatric): Secondary | ICD-10-CM | POA: Diagnosis not present

## 2020-04-24 MED FILL — CYCLOBENZAPRINE HCL 5 MG TA: 5 | 30 days supply | Qty: 30 | Fill #3

## 2020-04-25 DIAGNOSIS — Z1231 Encounter for screening mammogram for malignant neoplasm of breast: Secondary | ICD-10-CM | POA: Diagnosis not present

## 2020-04-29 ENCOUNTER — Other Ambulatory Visit: Payer: Self-pay | Admitting: Family Medicine

## 2020-04-29 MED FILL — HYDROCHLOROTHIAZIDE 25 MG T: 25 | 90 days supply | Qty: 90 | Fill #0

## 2020-04-30 ENCOUNTER — Encounter: Payer: Self-pay | Admitting: Family Medicine

## 2020-04-30 DIAGNOSIS — I493 Ventricular premature depolarization: Secondary | ICD-10-CM

## 2020-04-30 DIAGNOSIS — I4729 Other ventricular tachycardia: Secondary | ICD-10-CM

## 2020-04-30 DIAGNOSIS — E7849 Other hyperlipidemia: Secondary | ICD-10-CM

## 2020-04-30 DIAGNOSIS — I10 Essential (primary) hypertension: Secondary | ICD-10-CM

## 2020-04-30 DIAGNOSIS — I472 Ventricular tachycardia: Secondary | ICD-10-CM

## 2020-05-02 MED FILL — LOSARTAN POTASSIUM 25 MG TA: 25 | 90 days supply | Qty: 90 | Fill #2

## 2020-05-02 NOTE — Telephone Encounter (Signed)
Referral to [facility] pended

## 2020-05-03 ENCOUNTER — Other Ambulatory Visit: Payer: Self-pay | Admitting: Family Medicine

## 2020-05-03 MED ORDER — METOPROLOL SUCCINATE ER 25 MG PO TB24: 25.0000 mg | ORAL_TABLET | Freq: Every day | ORAL | 2 refills | Status: DC

## 2020-05-03 MED FILL — METOPROLOL SUCCINATE ER 25: 25 | 90 days supply | Qty: 90 | Fill #0

## 2020-05-06 ENCOUNTER — Other Ambulatory Visit (HOSPITAL_COMMUNITY): Payer: Self-pay | Admitting: Specialist

## 2020-05-07 ENCOUNTER — Encounter: Payer: Self-pay | Admitting: Family Medicine

## 2020-05-07 ENCOUNTER — Other Ambulatory Visit: Payer: Self-pay

## 2020-05-07 ENCOUNTER — Ambulatory Visit (INDEPENDENT_AMBULATORY_CARE_PROVIDER_SITE_OTHER): Payer: 59 | Admitting: Family Medicine

## 2020-05-07 ENCOUNTER — Other Ambulatory Visit: Payer: Self-pay | Admitting: Family Medicine

## 2020-05-07 VITALS — BP 121/77 | HR 80 | Ht 67.0 in | Wt 239.0 lb

## 2020-05-07 DIAGNOSIS — I4729 Other ventricular tachycardia: Secondary | ICD-10-CM

## 2020-05-07 DIAGNOSIS — I472 Ventricular tachycardia: Secondary | ICD-10-CM

## 2020-05-07 DIAGNOSIS — Z298 Encounter for other specified prophylactic measures: Secondary | ICD-10-CM | POA: Diagnosis not present

## 2020-05-07 DIAGNOSIS — M7552 Bursitis of left shoulder: Secondary | ICD-10-CM

## 2020-05-07 DIAGNOSIS — Z23 Encounter for immunization: Secondary | ICD-10-CM

## 2020-05-07 DIAGNOSIS — F324 Major depressive disorder, single episode, in partial remission: Secondary | ICD-10-CM | POA: Insufficient documentation

## 2020-05-07 DIAGNOSIS — I493 Ventricular premature depolarization: Secondary | ICD-10-CM | POA: Diagnosis not present

## 2020-05-07 MED ORDER — CLINDAMYCIN HCL 300 MG PO CAPS: 600.0000 mg | ORAL_CAPSULE | Freq: Once | ORAL | 0 refills | Status: DC

## 2020-05-07 MED FILL — OTREXUP 20 MG/0.4 ML AUTO-I: 20 | 28 days supply | Qty: 2 | Fill #0

## 2020-05-07 MED FILL — CLINDAMYCIN HCL 300 MG CAP: 300 | 2 days supply | Qty: 4 | Fill #0

## 2020-05-07 NOTE — Assessment & Plan Note (Signed)
Comfort has been sick persistent for several months at this point.  Patient is opting for an injection for pain relief.  Also consider formal physical therapy she will let me know if she would like to move forward with that.  Recommend icing the joint a couple times today after she goes home.  Patient tolerated the procedure really well.

## 2020-05-07 NOTE — Progress Notes (Addendum)
Established Patient Office Visit  Subjective:  Patient ID: Kathy Clark, female    DOB: 02-26-1961  Age: 59 y.o. MRN: [id number]  CC:  Chief Complaint  Patient presents with  . Shoulder Pain    HPI Kathy Clark presents for follow-up of palpitations.  Actually saw her about 5 weeks ago for palpitations that started to occur after she had COVID she had started experiencing some lightheadedness and shortness of breath with activity and feeling lightheaded with position change.  She is also been getting a flushing type headaches.  EKG at the office visit did confirm PVCs.  We also scheduled her for a heart monitor which showed mostly normal sinus rhythm but she did have some PACs as well as PVCs.  She was also noted to have some nonsustained V. tach which was rare and some nonsustained SVT which was rare.  Her symptoms correlated more with her PVCs.  Echocardiogram performed on 9/17 showed EF of 55 to 60% with some PVCs noted during the study.  A little bit of mild mitral valve regurgitation otherwise normal.  Was also noted to have some elevated liver enzymes and was told to hold her methotrexate for couple weeks and recheck.  Left shoulder bursitis -I seen her initially for this back in 02/06/2023 after her father passed away and she been doing a lot of packing.  We had recommended icing avoiding repetitive movements, switching to Celebrex and if not improving to let me know.  He says the Celebrex did actually help alleviate her pain but unfortunately it caused some swelling so she has discontinued it.  She also has dental procedure coming up.  The amoxicillin which she is taken in the past is contraindicated with the methotrexate and so she needs to find an alternative.  Past Medical History:  Diagnosis Date  . Anxiety   . Back pain   . 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 Depression   . Essential hypertension, benign   . Gastritis   . GERD (gastroesophageal reflux  disease)   . Hypertension   . Joint pain   . Lymphocytic colitis    microscopic  . Migraines   . Osteoarthritis   . Sciatica    right leg  . Swelling    feet, left foot    Past Surgical History:  Procedure Laterality Date  . ABDOMINAL HYSTERECTOMY  07-2005  . [place] Bunionectomy Left 09/18/2016   Left Foot  . BACK SURGERY    . KNEE SURGERY  2006   left; multiple  . microdiscetomy  2005  . SHOULDER SURGERY  07-22-09   right  . SKIN CANCER EXCISION     eyelid  . TOTAL KNEE ARTHROPLASTY  07/27/2011   Procedure: TOTAL KNEE ARTHROPLASTY;  Surgeon: Gearlean Alf;  Location: [facility];  Service: Orthopedics;  Laterality: Left;    Family History  Problem Relation Age of Onset  . Breast cancer Other   . Tongue cancer Mother   . Breast cancer Mother   . Anxiety disorder Mother   . Depression Mother   . Heart disease Father   . Hypertension Father   . Hyperlipidemia Father   . Stroke Paternal Grandmother   . Colon cancer Neg Hx     Social History   Socioeconomic History  . Marital status: Married    Spouse name: Jenny Reichmann  . Number of children: 2  . Years of education: Not on file  . Highest education  level: Not on file  Occupational History  . Occupation: Programmer, multimedia: [employer]  Tobacco Use  . Smoking status: Never Smoker  . Smokeless tobacco: Never Used  Vaping Use  . Vaping Use: Never used  Substance and Sexual Activity  . Alcohol use: Yes    Comment: occasionally  . Drug use: No  . 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  Other Topics Concern  . Not on file  Social History Narrative  . Not on file   Social Determinants of Health   Financial Resource Strain:   . Difficulty of Paying Living Expenses: Not on file  Food Insecurity:   . Worried About Charity fundraiser in the Last Year: Not on file  . Ran Out of Food in the Last Year: Not on file  Transportation  Needs:   . Lack of Transportation (Medical): Not on file  . Lack of Transportation (Non-Medical): Not on file  Physical Activity:   . Days of Exercise per Week: Not on file  . Minutes of Exercise per Session: Not on file  Stress:   . Feeling of Stress : Not on file  Social Connections:   . Frequency of Communication with Friends and Family: Not on file  . Frequency of Social Gatherings with Friends and Family: Not on file  . Attends Religious Services: Not on file  . Active Member of Clubs or Organizations: Not on file  . Attends Archivist Meetings: Not on file  . Marital Status: Not on file  Intimate Partner Violence:   . Fear of Current or Ex-Partner: Not on file  . Emotionally Abused: Not on file  . Physically Abused: Not on file  . Sexually Abused: Not on file    Outpatient Medications Prior to Visit  Medication Sig Dispense Refill  . AMBULATORY NON FORMULARY MEDICATION Medication Name: Please decrease CPAP setting to 8 cm of water pressure and fax [place] a download after 5 days.  Is having a lot of difficulty with air leaks is working to try to reduce her pressure to see if she is still being adequately treated but with fewer leaks.FAx to Start 1 Units 0  . Calcium Carbonate-Vitamin D (CALTRATE 600+D PO) Take 1 tablet by mouth daily.    . cyclobenzaprine (FLEXERIL) 5 MG tablet Take 1 tablet (5 mg total) by mouth at bedtime as needed for muscle spasms. 30 tablet 3  . EPINEPHrine 0.3 mg/0.3 mL IJ SOAJ injection Inject 0.3 mLs (0.3 mg total) into the muscle as needed for anaphylaxis. 2 each prn  . ergocalciferol (VITAMIN D2) 1.25 MG (50000 UT) capsule Take by mouth.    . estradiol (VIVELLE-DOT) 0.075 EN/27PO   3  . folic acid (FOLVITE) 1 MG tablet Take 1 mg by mouth daily.    [name] gabapentin (NEURONTIN) 300 MG capsule 1 caps PO QAM and 2 PO QH 270 capsule 3  . hydrochlorothiazide (HYDRODIURIL) 25 MG tablet TAKE 1 TABLET (25 MG TOTAL) BY MOUTH DAILY. 90 tablet 0  .  hydroxychloroquine (PLAQUENIL) 200 MG tablet Take 200 mg by mouth 2 (two) times daily.    [name] losartan (COZAAR) 25 MG tablet TAKE 1 TABLET BY MOUTH DAILY. 90 tablet 2  . Melatonin 3 MG TABS Take by mouth as directed.    . Methotrexate, PF, (OTREXUP) 20 MG/0.4ML SOAJ Inject into the skin.    [name]  metoprolol succinate (TOPROL-XL) 25 MG 24 hr tablet Take 1 tablet (25 mg total) by mouth at bedtime. 30 tablet 2  . Multiple Vitamins-Minerals (MULTIVITAMIN ADULT PO) Take by mouth daily.    . ondansetron (ZOFRAN) 4 MG tablet Take 1 tablet (4 mg total) by mouth every 8 (eight) hours as needed for nausea or vomiting. 20 tablet 0  . SUMAtriptan (IMITREX) 20 MG/ACT nasal spray PLACE 1 SPRAY INTO THE NOSE EVERY 2 HOURS AS NEEDED FOR. HEADACHE 6 Inhaler 2  . VIIBRYD 40 MG TABS TAKE 1 TABLET (40 MG TOTAL) BY MOUTH DAILY. 90 tablet 1  . methotrexate (RHEUMATREX) 2.5 MG tablet Take 8 tablets by mouth once a week.      No facility-administered medications prior to visit.    Allergies  Allergen Reactions  . Succinylcholine Anaphylaxis  . Dilaudid [Hydromorphone Hcl] Nausea And Vomiting  . Nsaids Other (See Comments)    Trigger colits    ROS Review of Systems    Objective:    Physical Exam Vitals reviewed.  Constitutional:      Appearance: She is well-developed.  HENT:     Head: Normocephalic and atraumatic.  Eyes:     Conjunctiva/sclera: Conjunctivae normal.  Cardiovascular:     Rate and Rhythm: Normal rate.  Pulmonary:     Effort: Pulmonary effort is normal.  Musculoskeletal:     Comments: Left shoulder with no skin rash or abnormalities.  Normal range of motion.  Skin:    General: Skin is dry.     Coloration: Skin is not pale.  Neurological:     Mental Status: She is alert and oriented to person, place, and time.  Psychiatric:        Behavior: Behavior normal.     BP 121/77   Pulse 80   Ht 5\' 7"  (1.702 m)   Wt 239 lb (108.4 kg)   LMP  (LMP Unknown) Comment: hystrectomy  SpO2 99%    BMI 37.43 kg/m  Wt Readings from Last 3 Encounters:  05/07/20 239 lb (108.4 kg)  03/26/20 233 lb (105.7 kg)  03/22/20 225 lb (102.1 kg)     There are no preventive care reminders to display for this patient.  There are no preventive care reminders to display for this patient.  Lab Results  Component Value Date   TSH 2.61 10/03/2019   Lab Results  Component Value Date   WBC 7.1 10/03/2019   HGB 13.7 10/03/2019   HCT 40.9 10/03/2019   MCV 89.5 10/03/2019   PLT 343 10/03/2019   Lab Results  Component Value Date   NA 140 03/22/2020   K 4.4 03/22/2020   CO2 31 03/22/2020   GLUCOSE 85 03/22/2020   BUN 13 03/22/2020   CREATININE 0.70 03/22/2020   BILITOT 0.6 03/22/2020   ALKPHOS 66 11/08/2019   AST 42 (H) 03/22/2020   ALT 64 (H) 03/22/2020   PROT 6.8 03/22/2020   ALBUMIN 4.3 11/08/2019   CALCIUM 9.6 03/22/2020   Lab Results  Component Value Date   CHOL 201 (H) 11/08/2019   Lab Results  Component Value Date   HDL 51 11/08/2019   Lab Results  Component Value Date   LDLCALC 132 (H) 11/08/2019   Lab Results  Component Value Date   TRIG 100 11/08/2019   Lab Results  Component Value Date   CHOLHDL 3.9 11/08/2019   Lab Results  Component Value Date   HGBA1C 5.2 11/08/2019      Assessment & Plan:  Problem List Items Addressed This Visit      Cardiovascular and Mediastinum   PVC (premature ventricular contraction) - Primary    She did get an appointment with cardiology in mid November.  She has not had a chance to pick up the metoprolol yet but will had the pharmacy this week even though the palpitations have improved some I still think worth a trial of the beta-blocker for couple weeks until she sees cardiology        Musculoskeletal and Integument   Bursitis of left shoulder    Comfort has been sick persistent for several months at this point.  Patient is opting for an injection for pain relief.  Also consider formal physical therapy she will let me  know if she would like to move forward with that.  Recommend icing the joint a couple times today after she goes home.  Patient tolerated the procedure really well.        Other   Indication present for endocarditis prophylaxis    Will use clindamycin since she is on methotrexate.  Prescription sent for both dental visits this year.       Other Visit Diagnoses    Non-sustained ventricular tachycardia ([place])       Need for immunization against influenza       Relevant Orders   Flu Vaccine QUAD 36+ mos IM (Completed)    Aspiration/Injection Procedure Note KORY PANJWANI [id number] [birth year]  Procedure: Injection Indications: left shoulder bursitis   Procedure Details Consent: Risks of procedure as well as the alternatives and risks of each were explained to the (patient/caregiver).  Consent for procedure obtained. Time Out: Verified patient identification, verified procedure, site/side was marked, verified correct patient position, special equipment/implants available, medications/allergies/relevent history reviewed, required imaging and test results available.  Performed   Local Anesthesia Used:Ethyl Chloride Spray Amount of Fluid Aspirated: minimal amount Injection:  A sterile dressing was applied.  Patient did tolerate procedure well. Estimated blood loss: none  Beatrice Lecher 05/09/2020, 12:44 PM   Meds ordered this encounter  Medications  . clindamycin (CLEOCIN) 300 MG capsule    Sig: Take 2 capsules (600 mg total) by mouth once for 1 dose. Before dental procedure    Dispense:  4 capsule    Refill:  0    Follow-up: No follow-ups on file.    Beatrice Lecher, MD

## 2020-05-07 NOTE — Assessment & Plan Note (Signed)
Will use clindamycin since she is on methotrexate.  Prescription sent for both dental visits this year.

## 2020-05-07 NOTE — Assessment & Plan Note (Signed)
She did get an appointment with cardiology in mid November.  She has not had a chance to pick up the metoprolol yet but will had the pharmacy this week even though the palpitations have improved some I still think worth a trial of the beta-blocker for couple weeks until she sees cardiology

## 2020-05-21 MED FILL — FOLIC ACID 1 MG TABS: 1 | 90 days supply | Qty: 90 | Fill #2

## 2020-05-21 MED FILL — DOTTI 0.075 MG/24HR PTTW: 0.075 | 84 days supply | Qty: 24 | Fill #2

## 2020-05-23 ENCOUNTER — Encounter: Payer: Self-pay | Admitting: Cardiology

## 2020-05-23 NOTE — Progress Notes (Signed)
Referring-Kathy Metheney MD Reason for referral-palpitations  HPI: 59 year old female for evaluation of palpitations request of Kathy Lecher MD.  Laboratories September 2021 showed potassium 4.4, normal renal function and mildly elevated liver functions.  Chest x-ray September 2021 showed no acute infiltrate.  Echocardiogram September 2021 showed normal LV function, mild mitral regurgitation.  Monitor October 2021 showed sinus rhythm with PACs, PVCs, rare nonsustained SVT and 11 beats of nonsustained VT.  Symptoms correlated with PVCs.  Patient had Covid in August.  She was ill for approximately 12 days.  Since that time she developed palpitations described as heart skipping.  She also has some dyspnea on exertion which is slowly improving.  No orthopnea, PND or pedal edema.  She has not had chest pain or syncope.  She was placed on metoprolol and her palpitations have improved.  Her dyspnea is also improving with time.  Cardiology now asked to evaluate.  Current Outpatient Medications  Medication Sig Dispense Refill   AMBULATORY NON FORMULARY MEDICATION Medication Name: Please decrease CPAP setting to 8 cm of water pressure and fax [place] a download after 5 days.  Is having a lot of difficulty with air leaks is working to try to reduce her pressure to see if she is still being adequately treated but with fewer leaks.FAx to [place] 1 Units 0   Calcium Carbonate-Vitamin D (CALTRATE 600+D PO) Take 1 tablet by mouth daily.     cyclobenzaprine (FLEXERIL) 5 MG tablet Take 1 tablet (5 mg total) by mouth at bedtime as needed for muscle spasms. 30 tablet 3   EPINEPHrine 0.3 mg/0.3 mL IJ SOAJ injection Inject 0.3 mLs (0.3 mg total) into the muscle as needed for anaphylaxis. 2 each prn   ergocalciferol (VITAMIN D2) 1.25 MG (50000 UT) capsule Take by mouth.     estradiol (VIVELLE-DOT) 0.075 ON/62XB   3   folic acid (FOLVITE) 1 MG tablet Take 1 mg by mouth daily.     gabapentin  (NEURONTIN) 300 MG capsule 1 caps PO QAM and 2 PO QH 270 capsule 3   hydrochlorothiazide (HYDRODIURIL) 25 MG tablet TAKE 1 TABLET (25 MG TOTAL) BY MOUTH DAILY. 90 tablet 0   losartan (COZAAR) 25 MG tablet TAKE 1 TABLET BY MOUTH DAILY. 90 tablet 2   Melatonin 3 MG TABS Take by mouth as directed.     Methotrexate, PF, (OTREXUP) 20 MG/0.4ML SOAJ Inject into the skin.     metoprolol succinate (TOPROL-XL) 25 MG 24 hr tablet Take 1 tablet (25 mg total) by mouth at bedtime. 30 tablet 2   Multiple Vitamins-Minerals (MULTIVITAMIN ADULT PO) Take by mouth daily.     ondansetron (ZOFRAN) 4 MG tablet Take 1 tablet (4 mg total) by mouth every 8 (eight) hours as needed for nausea or vomiting. 20 tablet 0   SUMAtriptan (IMITREX) 20 MG/ACT nasal spray PLACE 1 SPRAY INTO THE NOSE EVERY 2 HOURS AS NEEDED FOR. HEADACHE 6 Inhaler 2   VIIBRYD 40 MG TABS TAKE 1 TABLET (40 MG TOTAL) BY MOUTH DAILY. 90 tablet 1   No current facility-administered medications for this visit.    Allergies  Allergen Reactions   Succinylcholine Anaphylaxis   Dilaudid [Hydromorphone Hcl] Nausea And Vomiting   Nsaids Other (See Comments)    Trigger colits     Past Medical History:  Diagnosis Date   Anxiety    Back pain    Complication of anesthesia    allergy to Succinylcholine   Depression    Essential  hypertension, benign    Gastritis    GERD (gastroesophageal reflux disease)    Joint pain    Lymphocytic colitis    microscopic   Migraines    Osteoarthritis    Sciatica    right leg   Swelling    feet, left foot    Past Surgical History:  Procedure Laterality Date   ABDOMINAL HYSTERECTOMY  07-2005   [place] Bunionectomy Left 09/18/2016   Left Foot   BACK SURGERY     KNEE SURGERY  2006   left; multiple   microdiscetomy  2005   SHOULDER SURGERY  07-22-09   right   SKIN CANCER EXCISION     eyelid   TOTAL KNEE ARTHROPLASTY  07/27/2011   Procedure: TOTAL KNEE ARTHROPLASTY;   Surgeon: Gearlean Alf;  Location: [facility];  Service: Orthopedics;  Laterality: Left;    Social History   Socioeconomic History   Marital status: Married    Spouse name: John   Number of children: 2   Years of education: Not on file   Highest education level: Not on file  Occupational History   Occupation: Programmer, multimedia: [employer]  Tobacco Use   Smoking status: Never Smoker   Smokeless tobacco: Never Used  Scientific laboratory technician Use: Never used  Substance and Sexual Activity   Alcohol use: Yes    Comment: occasionally   Drug use: No   Sexual activity: Yes    Birth control/protection: Surgical    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  Other Topics Concern   Not on file  Social History Narrative   Not on file   Social Determinants of Health   Financial Resource Strain:    Difficulty of Paying Living Expenses: Not on file  Food Insecurity:    Worried About [place] in the Last Year: Not on file   Ran Out of Food in the Last Year: Not on file  Transportation Needs:    Lack of Transportation (Medical): Not on file   Lack of Transportation (Non-Medical): Not on file  Physical Activity:    Days of Exercise per Week: Not on file   Minutes of Exercise per Session: Not on file  Stress:    Feeling of Stress : Not on file  Social Connections:    Frequency of Communication with Friends and Family: Not on file   Frequency of Social Gatherings with Friends and Family: Not on file   Attends Religious Services: Not on file   Active Member of Clubs or Organizations: Not on file   Attends Archivist Meetings: Not on file   Marital Status: Not on file  Intimate Partner Violence:    Fear of Current or Ex-Partner: Not on file   Emotionally Abused: Not on file   Physically Abused: Not on file   Sexually Abused: Not on file    Family History  Problem Relation Age of Onset   Breast cancer Other     Tongue cancer Mother    Breast cancer Mother    Anxiety disorder Mother    Depression Mother    Heart disease Father    Hypertension Father    Hyperlipidemia Father    Stroke Paternal Grandmother    Colon cancer Neg Hx     ROS: no fevers or chills, productive cough, hemoptysis, dysphasia, odynophagia, melena, hematochezia, dysuria, hematuria, rash, seizure activity, orthopnea, PND, pedal edema, claudication. Remaining systems are negative.  Physical Exam:   Blood pressure 118/80, pulse 71, height 5\' 8"  (1.727 m), weight 245 lb (111.1 kg), SpO2 97 %.  General:  Well developed/well nourished in NAD Skin warm/dry Patient not depressed No peripheral clubbing Back-normal HEENT-normal/normal eyelids Neck supple/normal carotid upstroke bilaterally; no bruits; no JVD; no thyromegaly chest - CTA/ normal expansion CV - RRR/normal S1 and S2; no murmurs, rubs or gallops;  PMI nondisplaced Abdomen -NT/ND, no HSM, no mass, + bowel sounds, no bruit 2+ femoral pulses, no bruits Ext-no edema, chords, 2+ DP Neuro-grossly nonfocal  ECG -March 26, 2020-sinus rhythm with PVC.  Personally reviewed  A/P  1 palpitations-monitor shows PACs, PVCs, brief PAT and nonsustained ventricular tachycardia.  Her symptoms began after developing Covid.  Question component of myocarditis at that time.  However her LV function is normal on echocardiogram.  We will continue metoprolol for now.  Can consider discontinuing in the future if her symptoms improve.  Note her potassium was normal.  We will check TSH to be complete.  2 hypertension-patient's blood pressure is controlled.  Continue present medications and follow.  Kirk Ruths, MD

## 2020-05-29 ENCOUNTER — Encounter: Payer: Self-pay | Admitting: Cardiology

## 2020-05-29 ENCOUNTER — Ambulatory Visit: Payer: 59 | Admitting: Cardiology

## 2020-05-29 ENCOUNTER — Other Ambulatory Visit: Payer: Self-pay

## 2020-05-29 VITALS — BP 118/80 | HR 71 | Ht 68.0 in | Wt 245.0 lb

## 2020-05-29 DIAGNOSIS — I10 Essential (primary) hypertension: Secondary | ICD-10-CM | POA: Diagnosis not present

## 2020-05-29 DIAGNOSIS — R002 Palpitations: Secondary | ICD-10-CM | POA: Diagnosis not present

## 2020-05-29 MED FILL — GABAPENTIN 300 MG CAPSULE: 300 | 90 days supply | Qty: 270 | Fill #1

## 2020-05-29 NOTE — Patient Instructions (Signed)

## 2020-05-30 LAB — TSH: TSH: 2.33 u[IU]/mL (ref 0.450–4.500)

## 2020-05-31 ENCOUNTER — Ambulatory Visit: Payer: 59

## 2020-06-02 MED FILL — OTREXUP 20 MG/0.4 ML AUTO-I: 20 | 28 days supply | Qty: 2 | Fill #1

## 2020-06-17 ENCOUNTER — Ambulatory Visit: Payer: 59 | Attending: Internal Medicine

## 2020-06-17 ENCOUNTER — Other Ambulatory Visit (HOSPITAL_BASED_OUTPATIENT_CLINIC_OR_DEPARTMENT_OTHER): Payer: Self-pay | Admitting: Internal Medicine

## 2020-06-17 DIAGNOSIS — Z23 Encounter for immunization: Secondary | ICD-10-CM

## 2020-06-17 NOTE — Progress Notes (Signed)
   Covid-19 Vaccination Clinic  Name:  GRISELLE RUFER    MRN: [id number] DOB: 1960-12-17  06/17/2020  Ms. Campo was observed post Covid-19 immunization for 15 minutes without incident. She was provided with Vaccine Information Sheet and instruction to access the V-Safe system.   Of note, patient did have mild muscle twitching at injection site. Patient was offered additional monitoring after initial 15 minute monitoring, which was denied.   Ms. Dame was instructed to call 911 with any severe reactions post vaccine: [name] Difficulty breathing  . Swelling of face and throat  . A fast heartbeat  . A bad rash all over body  . Dizziness and weakness   Immunizations Administered    Name Date Dose VIS Date [place] COVID-19 Vaccine 06/17/2020 12:05 PM 0.3 mL 05/01/2020 Intramuscular   Manufacturer: [employer]   Lot: [id number]   [place]: [address]-6

## 2020-06-18 ENCOUNTER — Other Ambulatory Visit: Payer: Self-pay | Admitting: Family Medicine

## 2020-06-18 MED FILL — CYCLOBENZAPRINE HCL 5 MG TA: 5 | 30 days supply | Qty: 30 | Fill #0

## 2020-06-24 MED FILL — PFIZER-BIONTECH COVID-19 VA: 30 | 1 days supply | Qty: 0 | Fill #0

## 2020-06-28 ENCOUNTER — Encounter: Payer: Self-pay | Admitting: Family Medicine

## 2020-07-01 ENCOUNTER — Other Ambulatory Visit: Payer: Self-pay | Admitting: Family Medicine

## 2020-07-01 MED ORDER — VIIBRYD 20 MG PO TABS: ORAL_TABLET | ORAL | 0 refills | Status: DC

## 2020-07-01 MED FILL — VIIBRYD 20 MG TABLET: 20 | 90 days supply | Qty: 90 | Fill #0

## 2020-07-01 MED FILL — OTREXUP 20 MG/0.4 ML AUTO-I: 20 | 28 days supply | Qty: 2 | Fill #2

## 2020-07-11 DIAGNOSIS — D84821 Immunodeficiency due to drugs: Secondary | ICD-10-CM | POA: Diagnosis not present

## 2020-07-11 DIAGNOSIS — Z79899 Other long term (current) drug therapy: Secondary | ICD-10-CM | POA: Diagnosis not present

## 2020-07-11 DIAGNOSIS — M0609 Rheumatoid arthritis without rheumatoid factor, multiple sites: Secondary | ICD-10-CM | POA: Diagnosis not present

## 2020-07-17 DIAGNOSIS — G4733 Obstructive sleep apnea (adult) (pediatric): Secondary | ICD-10-CM | POA: Diagnosis not present

## 2020-07-19 ENCOUNTER — Other Ambulatory Visit: Payer: Self-pay

## 2020-07-19 ENCOUNTER — Ambulatory Visit (HOSPITAL_BASED_OUTPATIENT_CLINIC_OR_DEPARTMENT_OTHER): Payer: 59 | Admitting: Pharmacist

## 2020-07-19 ENCOUNTER — Other Ambulatory Visit: Payer: Self-pay | Admitting: Internal Medicine

## 2020-07-19 DIAGNOSIS — Z79899 Other long term (current) drug therapy: Secondary | ICD-10-CM

## 2020-07-19 MED ORDER — ENBREL SURECLICK 50 MG/ML ~~LOC~~ SOAJ: SUBCUTANEOUS | 3 refills | Status: DC

## 2020-07-19 NOTE — Progress Notes (Signed)
   S: Patient presents for review of their specialty medication therapy.  Patient is currently prescribed Enbrel for RA. Patient is managed by Dr. Franki Monte for this.   Adherence: has not yet started; counseling given   Efficacy: has not yet started; counseling given   Dosing: Ankylosing spondylitis, psoriatic arthritis, rheumatoid arthritis: SubQ: Note: May continue methotrexate, glucocorticoids, salicylates, NSAIDs, or analgesics during etanercept therapy. Once-weekly dosing: 50 mg once weekly; maximum dose (rheumatoid arthritis): 50 mg/week.  Screening: TB test: completed per pt Hepatitis B: completed per pt  Monitoring: Injection site reactions: has not yet started; counseling given  S/sx of infections: has not yet started; counseling given  S/sx of malignancy: has not yet started; counseling given  GI upset: has not yet started; counseling given     O:     Lab Results  Component Value Date   WBC 7.1 10/03/2019   HGB 13.7 10/03/2019   HCT 40.9 10/03/2019   MCV 89.5 10/03/2019   PLT 343 10/03/2019      Chemistry      Component Value Date/Time   NA 140 03/22/2020 1405   NA 139 11/08/2019 0806   K 4.4 03/22/2020 1405   CL 101 03/22/2020 1405   CO2 31 03/22/2020 1405   BUN 13 03/22/2020 1405   BUN 18 11/08/2019 0806   CREATININE 0.70 03/22/2020 1405      Component Value Date/Time   CALCIUM 9.6 03/22/2020 1405   ALKPHOS 66 11/08/2019 0806   AST 42 (H) 03/22/2020 1405   ALT 64 (H) 03/22/2020 1405   BILITOT 0.6 03/22/2020 1405   BILITOT 0.3 11/08/2019 0806       A/P: 1. Medication review: patient currently prescribed Enbrel for RA. Reviewed the medication with the patient, including the following: Enbrel (etanercept) binds tumor necrosis factor (TNF) and blocks its interaction with cell surface receptors. TNF plays an important role in the inflammatory processes of many diseases. Patient educated on purpose, proper use and potential adverse effects of Enbrel.  SubQ: Administer subcutaneously. Rotate injection sites; may inject into the thigh (preferred), abdomen (avoiding the 2-inch area around the navel), or outer areas of upper arm. New injections should be given at least 1 inch from an old site and never into areas where the skin is tender, bruised, red, or hard; any raised thick, red, or scaly skin patches or lesions; or areas with scars or stretch marks. For a more comfortable injection, allow autoinjectors, prefilled syringes, and dose trays to reach room temperature for 15 to 30 minutes (?30 minutes for autoinjector) prior to injection; do not remove the needle cover while allowing product to reach room temperature. There may be small white particles of protein in the solution; this is not unusual for proteinaceous solutions. Possible adverse effects include rash, GI upset, increased risk of infection, and injection site reactions. Patients should stop Enbrel if they develop a serious infection. There is a possible increased risk in lymphoma and other malignancies. No recommendations for any changes.   Benard Halsted, PharmD, [place], [place] [phone]

## 2020-07-23 MED FILL — ENBREL SURECLICK 50 MG/ML S: 50 | 28 days supply | Qty: 4 | Fill #0

## 2020-07-31 ENCOUNTER — Other Ambulatory Visit: Payer: Self-pay | Admitting: Family Medicine

## 2020-07-31 DIAGNOSIS — I10 Essential (primary) hypertension: Secondary | ICD-10-CM

## 2020-07-31 MED FILL — LOSARTAN POTASSIUM 25 MG TA: 25 | 90 days supply | Qty: 90 | Fill #0

## 2020-08-01 ENCOUNTER — Other Ambulatory Visit: Payer: Self-pay | Admitting: Family Medicine

## 2020-08-01 DIAGNOSIS — I10 Essential (primary) hypertension: Secondary | ICD-10-CM

## 2020-08-01 MED FILL — RASUVO 20 MG/0.4 ML AUTOINJ: 20 | 28 days supply | Qty: 2 | Fill #3

## 2020-08-02 ENCOUNTER — Other Ambulatory Visit: Payer: Self-pay | Admitting: Family Medicine

## 2020-08-02 DIAGNOSIS — I10 Essential (primary) hypertension: Secondary | ICD-10-CM

## 2020-08-02 MED FILL — CELECOXIB 100 MG CAPS: 100 | 30 days supply | Qty: 60 | Fill #1

## 2020-08-02 MED FILL — METOPROLOL SUCCINATE ER 25: 25 | 90 days supply | Qty: 90 | Fill #0

## 2020-08-02 MED FILL — HYDROCHLOROTHIAZIDE 25 MG T: 25 | 90 days supply | Qty: 90 | Fill #0

## 2020-08-03 ENCOUNTER — Other Ambulatory Visit: Payer: Self-pay | Admitting: Family Medicine

## 2020-08-03 DIAGNOSIS — I10 Essential (primary) hypertension: Secondary | ICD-10-CM

## 2020-08-05 ENCOUNTER — Other Ambulatory Visit: Payer: Self-pay | Admitting: Family Medicine

## 2020-08-06 ENCOUNTER — Other Ambulatory Visit: Payer: Self-pay | Admitting: Sports Medicine

## 2020-08-06 ENCOUNTER — Other Ambulatory Visit: Payer: Self-pay

## 2020-08-06 ENCOUNTER — Ambulatory Visit (INDEPENDENT_AMBULATORY_CARE_PROVIDER_SITE_OTHER): Payer: 59

## 2020-08-06 ENCOUNTER — Ambulatory Visit (INDEPENDENT_AMBULATORY_CARE_PROVIDER_SITE_OTHER): Payer: 59 | Admitting: Sports Medicine

## 2020-08-06 DIAGNOSIS — M47812 Spondylosis without myelopathy or radiculopathy, cervical region: Secondary | ICD-10-CM | POA: Diagnosis not present

## 2020-08-06 DIAGNOSIS — M7552 Bursitis of left shoulder: Secondary | ICD-10-CM | POA: Diagnosis not present

## 2020-08-06 DIAGNOSIS — M25512 Pain in left shoulder: Secondary | ICD-10-CM | POA: Diagnosis not present

## 2020-08-06 MED ORDER — HYDROCODONE-ACETAMINOPHEN 10-325 MG PO TABS: 1.0000 | ORAL_TABLET | Freq: Three times a day (TID) | ORAL | 0 refills | Status: DC | PRN

## 2020-08-06 MED ORDER — PREDNISONE 50 MG PO TABS: ORAL_TABLET | ORAL | 0 refills | Status: DC

## 2020-08-06 MED FILL — HYDROCODON-APAP 10-325: 10-325 | 5 days supply | Qty: 15 | Fill #0

## 2020-08-06 MED FILL — predniSONE 50 MG TABS: 50 | 5 days supply | Qty: 5 | Fill #0

## 2020-08-06 NOTE — Progress Notes (Signed)
    Procedures performed today:    None.  Independent interpretation of notes and tests performed by another provider:   None.  Brief History, Exam, Impression, and Recommendations:    Bursitis of left shoulder Kathy Clark has unfortunately had pain for some time now in her left shoulder, localized over the deltoid and worse with overhead activities, it sounds like she had a couple of subacromial injections without guidance without much relief. On exam today she does have impingement symptoms, we are going to do a 5-day burst of prednisone, x-rays, MRI due to failure of greater than 6 weeks of conservative treatment, considering severe pain of also going to add high-dose hydrocodone, I would like to see her back after the MRI to come up with a plan.    ___________________________________________ Gwen Her. Dianah Field, M.D., ABFM., CAQSM. Primary Care and [place] Instructor of [place] of [facility]

## 2020-08-06 NOTE — Assessment & Plan Note (Signed)
Kathy Clark has unfortunately had pain for some time now in her left shoulder, localized over the deltoid and worse with overhead activities, it sounds like she had a couple of subacromial injections without guidance without much relief. On exam today she does have impingement symptoms, we are going to do a 5-day burst of prednisone, x-rays, MRI due to failure of greater than 6 weeks of conservative treatment, considering severe pain of also going to add high-dose hydrocodone, I would like to see her back after the MRI to come up with a plan.

## 2020-08-12 ENCOUNTER — Ambulatory Visit (INDEPENDENT_AMBULATORY_CARE_PROVIDER_SITE_OTHER): Payer: 59

## 2020-08-12 ENCOUNTER — Other Ambulatory Visit: Payer: Self-pay

## 2020-08-12 DIAGNOSIS — M25412 Effusion, left shoulder: Secondary | ICD-10-CM | POA: Diagnosis not present

## 2020-08-12 DIAGNOSIS — M19012 Primary osteoarthritis, left shoulder: Secondary | ICD-10-CM | POA: Diagnosis not present

## 2020-08-12 DIAGNOSIS — M7552 Bursitis of left shoulder: Secondary | ICD-10-CM | POA: Diagnosis not present

## 2020-08-12 DIAGNOSIS — M6588 Other synovitis and tenosynovitis, other site: Secondary | ICD-10-CM | POA: Diagnosis not present

## 2020-08-15 ENCOUNTER — Other Ambulatory Visit: Payer: Self-pay | Admitting: Family Medicine

## 2020-08-15 MED FILL — CYCLOBENZAPRINE HCL 5 MG TA: 5 | 30 days supply | Qty: 30 | Fill #1

## 2020-08-15 MED FILL — DOTTI 0.075 MG/24HR PTTW: 0.075 | 84 days supply | Qty: 24 | Fill #3

## 2020-08-16 ENCOUNTER — Other Ambulatory Visit: Payer: Self-pay

## 2020-08-16 ENCOUNTER — Ambulatory Visit (INDEPENDENT_AMBULATORY_CARE_PROVIDER_SITE_OTHER): Payer: 59 | Admitting: Sports Medicine

## 2020-08-16 ENCOUNTER — Other Ambulatory Visit: Payer: Self-pay | Admitting: Family Medicine

## 2020-08-16 DIAGNOSIS — M7552 Bursitis of left shoulder: Secondary | ICD-10-CM

## 2020-08-16 MED FILL — SUMAtriptan 20 MG/ACT SOLN: 20 | 90 days supply | Qty: 6 | Fill #0

## 2020-08-16 NOTE — Progress Notes (Signed)
    Procedures performed today:    None.  Independent interpretation of notes and tests performed by another provider:   Shoulder MRI personally reviewed, I do see rotator cuff tendinosis, acromioclavicular osteoarthritis as the dominant findings.  Brief History, Exam, Impression, and Recommendations:    Bursitis of left shoulder This is a pleasant 60 year old female, she has chronic left shoulder pain, symptoms are predominately impingement. She has had a couple of unguided subacromial injections without much improvement. She has done home physical therapy but not formal. We did obtain an MRI that showed the expected degenerative processes including rotator cuff tendinosis without obvious tearing, acromioclavicular osteoarthritis, bicipital tendinosis. As she has already had several injections and physician directed home physical therapy I would like Dr. Tamera Punt to go ahead and offer a surgical opinion.    ___________________________________________ Kathy Clark. Kathy Clark, M.D., ABFM., CAQSM. Primary Care and [place] Instructor of [employer] [facility] [employer] [facility]

## 2020-08-16 NOTE — Assessment & Plan Note (Signed)
This is a pleasant 60 year old female, she has chronic left shoulder pain, symptoms are predominately impingement. She has had a couple of unguided subacromial injections without much improvement. She has done home physical therapy but not formal. We did obtain an MRI that showed the expected degenerative processes including rotator cuff tendinosis without obvious tearing, acromioclavicular osteoarthritis, bicipital tendinosis. As she has already had several injections and physician directed home physical therapy I would like Dr. Tamera Punt to go ahead and offer a surgical opinion.

## 2020-08-20 ENCOUNTER — Ambulatory Visit: Payer: 59 | Admitting: Physician Assistant

## 2020-08-20 MED FILL — ENBREL SURECLICK 50 MG/ML S: 50 | 28 days supply | Qty: 4 | Fill #1

## 2020-08-28 MED FILL — RASUVO 20 MG/0.4 ML AUTOINJ: 20 | 28 days supply | Qty: 2 | Fill #4

## 2020-08-28 MED FILL — GABAPENTIN 300 MG CAPSULE: 300 | 90 days supply | Qty: 270 | Fill #2

## 2020-08-28 MED FILL — FOLIC ACID 1 MG TABS: 1 | 90 days supply | Qty: 90 | Fill #3

## 2020-08-30 DIAGNOSIS — M7502 Adhesive capsulitis of left shoulder: Secondary | ICD-10-CM | POA: Diagnosis not present

## 2020-09-12 MED FILL — ENBREL SURECLICK 50 MG/ML S: 50 | 28 days supply | Qty: 4 | Fill #2

## 2020-09-18 ENCOUNTER — Ambulatory Visit: Payer: 59 | Admitting: Physical Therapy

## 2020-09-18 ENCOUNTER — Encounter: Payer: Self-pay | Admitting: Physical Therapy

## 2020-09-18 ENCOUNTER — Other Ambulatory Visit: Payer: Self-pay

## 2020-09-18 DIAGNOSIS — M25512 Pain in left shoulder: Secondary | ICD-10-CM | POA: Diagnosis not present

## 2020-09-18 DIAGNOSIS — M6281 Muscle weakness (generalized): Secondary | ICD-10-CM | POA: Diagnosis not present

## 2020-09-18 DIAGNOSIS — G8929 Other chronic pain: Secondary | ICD-10-CM

## 2020-09-18 DIAGNOSIS — R29898 Other symptoms and signs involving the musculoskeletal system: Secondary | ICD-10-CM | POA: Diagnosis not present

## 2020-09-18 NOTE — Therapy (Signed)
[facility], [place], [address] Phone: [phone]   Fax:  [phone]  Physical Therapy Evaluation  Patient Details  Name: Kathy Clark MRN: [id number] Date of Birth: 20-Nov-1960 Referring Provider (PT): chandler, justin   Encounter Date: 09/18/2020   PT End of Session - 09/18/20 1149    Visit Number 1    Number of Visits 12    Date for PT Re-Evaluation 10/30/20    PT Start Time 1100    PT Stop Time 1150    PT Time Calculation (min) 50 min    Activity Tolerance Patient tolerated treatment well    Behavior During Therapy [facility] for tasks assessed/performed           Past Medical History:  Diagnosis Date  . Anxiety   . Back pain   . 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 Depression   . Essential hypertension, benign   . Gastritis   . GERD (gastroesophageal reflux disease)   . Joint pain   . Lymphocytic colitis    microscopic  . Migraines   . Osteoarthritis   . Sciatica    right leg  . Swelling    feet, left foot    Past Surgical History:  Procedure Laterality Date  . ABDOMINAL HYSTERECTOMY  07-2005  . [place] Bunionectomy Left 09/18/2016   Left Foot  . BACK SURGERY    . KNEE SURGERY  2006   left; multiple  . microdiscetomy  2005  . SHOULDER SURGERY  07-22-09   right  . SKIN CANCER EXCISION     eyelid  . TOTAL KNEE ARTHROPLASTY  07/27/2011   Procedure: TOTAL KNEE ARTHROPLASTY;  Surgeon: Gearlean Alf;  Location: [facility];  Service: Orthopedics;  Laterality: Left;    There were no vitals filed for this visit.    Subjective Assessment - 09/18/20 1110    Subjective Pt had a fall on her brick sidewalk 03/2020 and used her Lt shoulder to catch her fall. Pt saw MD 10/21 and was diagnosed with bursitis and had an injection.  Pt then returned to MD and was referred to orthopedic surgeon and was diagnosed with frozen shoulder. Pt recieved another injection and was  given home stretching program. Pt limited in home program by pain and was referred to PT to improve ROM and decrease pain.    Pertinent History Rt RTC repair with frozen shoulder, rheumatoid arthritis    Limitations Lifting;House hold activities    Diagnostic tests x ray and MRI show bone spurs    Patient Stated Goals improve motion and reduce pain. avoid surgery    Currently in Pain? Yes    Pain Score 4     Pain Location Shoulder    Pain Orientation Left    Pain Descriptors / Indicators Aching    Pain Type Chronic pain    Pain Onset More than a month ago    Pain Frequency Constant    Aggravating Factors  reaching behind, IR    Pain Relieving Factors heat, tylenol    Effect of Pain on Daily Activities hard to wash hair and put a jacket on              [facility] PT Assessment - 09/18/20 0001      Assessment   Medical Diagnosis left frozen shoulder    Referring Provider (PT) chandler, justin    Onset Date/Surgical Date 03/13/20  Balance Screen   Has the patient fallen in the past 6 months Yes    How many times? 1    Has the patient had a decrease in activity level because of a fear of falling?  No    Is the patient reluctant to leave their home because of a fear of falling?  No      Observation/Other Assessments   Focus on Therapeutic Outcomes (FOTO)  55 functional status measure      Posture/Postural Control   Posture Comments rounded shoulders      ROM / Strength   AROM / PROM / Strength AROM;Strength      AROM   AROM Assessment Site Shoulder    Right/Left Shoulder Left    Left Shoulder Flexion 132 Degrees    Left Shoulder ABduction 91 Degrees    Left Shoulder Internal Rotation 55 Degrees    Left Shoulder External Rotation 59 Degrees      Strength   Overall Strength Comments biceps 4/5 bilat    Strength Assessment Site Shoulder    Right/Left Shoulder Right;Left    Right Shoulder Flexion 4/5    Right Shoulder ABduction 4/5    Left Shoulder Flexion 3+/5   pain    Left Shoulder ABduction 3+/5   pain     Palpation   Palpation comment shoulder joint hypomobility with inferior and A/P glides, TTP Lt anterior shoulder                      Objective measurements completed on examination: See above findings.       [facility] Adult PT Treatment/Exercise - 09/18/20 0001      Exercises   Exercises Shoulder      Shoulder Exercises: Supine   External Rotation AAROM;Left;10 reps    Flexion AAROM;Left;10 reps    ABduction AAROM;Left;10 reps      Shoulder Exercises: Seated   Retraction Strengthening;Both;10 reps    Other Seated Exercises shoulder rolls backwards x 10      Manual Therapy   Manual Therapy Joint mobilization;Passive ROM    Joint Mobilization grade 2-3 mobs Lt shoulder inferior and A/P mobs    Passive ROM Lt shoulder to tolerance                  PT Education - 09/18/20 1149    Education Details HEP, PT POC and goals    Person(s) Educated Patient    Methods Explanation;Handout;Demonstration    Comprehension Verbalized understanding;Returned demonstration               PT Long Term Goals - 09/18/20 1237      PT LONG TERM GOAL #1   Title Pt will be independent with HEP    Time 6    Period Weeks    Status New    Target Date 10/30/20      PT LONG TERM GOAL #2   Title Pt will improve FOTO to >= 65 to demo functional improvement    Time 6    Period Weeks    Status New    Target Date 10/30/20      PT LONG TERM GOAL #3   Title Pt will improve Lt UE strength to 4/5 to perform IADLs with decreased pain    Time 6    Period Weeks    Status New    Target Date 10/30/20      PT LONG TERM GOAL #4   Title Pt will  improve Lt shoulder ROM to within 10 degrees of Rt shoulder to perform ADLs with decreased pain    Time 6    Period Weeks    Status New    Target Date 10/30/20                  Plan - 09/18/20 1143    Clinical Impression Statement Pt presents with decreased strength and ROM and  functional abilities and will benefit from skilled PT to address deficits and improve functional independence    Personal Factors and Comorbidities Comorbidity 2;Age;Time since onset of injury/illness/exacerbation    Examination-Activity Limitations Bathing;Carry;Reach Overhead;Hygiene/Grooming    Examination-Participation Restrictions Community Activity    Stability/Clinical Decision Making Stable/Uncomplicated    Clinical Decision Making Low    Rehab Potential Good    PT Frequency 2x / week    PT Duration 6 weeks    PT Treatment/Interventions ADLs/Self Care Home Management;Cryotherapy;Moist Heat;Iontophoresis 4mg /ml Dexamethasone;Electrical Stimulation;Functional mobility training;Neuromuscular re-education;Therapeutic exercise;Therapeutic activities;Patient/family education;Manual techniques;Passive range of motion;Dry needling;Taping    PT Next Visit Plan assess HEP, manual to increase ROM, progress strengthening and ROM exercises    PT Home Exercise Plan Access Code: [email or web address]    Consulted and Agree with Plan of Care Patient           Patient will benefit from skilled therapeutic intervention in order to improve the following deficits and impairments:  Hypomobility,Pain,Impaired UE functional use,Decreased strength,Decreased activity tolerance,Decreased range of motion  Visit Diagnosis: Muscle weakness (generalized) - Plan: PT plan of care cert/re-cert  Chronic left shoulder pain - Plan: PT plan of care cert/re-cert  Other symptoms and signs involving the musculoskeletal system - Plan: PT plan of care cert/re-cert     Problem List Patient Active Problem List   Diagnosis Date Noted  . Indication present for endocarditis prophylaxis 05/07/2020  . PVC (premature ventricular contraction) 05/07/2020  . Major depressive disorder with single episode, in partial remission ([place]) 05/07/2020  . Bursitis of left shoulder 02/08/2020  . Annual physical exam 01/10/2020  . Sleeping  difficulties 01/10/2020  . Rheumatoid arthritis of multiple sites with negative rheumatoid factor ([place]) 12/04/2019  . Polyarthralgia 11/02/2019  . Myofascial pain 11/02/2019  . Arthralgia of both hands 10/03/2019  . Metatarsalgia of left foot 01/05/2018  . Other hyperlipidemia 03/23/2017  . OSA (obstructive sleep apnea) 02/25/2017  . Class 1 obesity without serious comorbidity with body mass index (BMI) of 34.0 to 34.9 in adult 08/17/2016  . Elevated liver function tests 08/17/2016  . Vitamin D deficiency 08/03/2016  . Left tennis elbow 04/06/2016  . 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 Depression 08/23/2015  . Subacromial bursitis 04/23/2015  . Left carpal tunnel syndrome, post right carpal tunnel release 12/24/2014  . Trochanteric bursitis of right hip 04/21/2013  . Postop Mild Hyponatremia 07/30/2011  . OSTEOARTHRITIS, KNEE, LEFT 12/12/2009  . MIGRAINE WITH AURA 05/31/2008  . SKIN CANCER, HX OF 01/06/2008  . COLITIS, HX OF 01/06/2008  . ALLERGIC RHINITIS 05/20/2007  . HYPERTENSION, BENIGN ESSENTIAL 11/23/2006  . SCIATICA, CHRONIC 05/19/2006   [name], PT  Carolynn Tuley 09/18/2020, 12:45 PM  [facility] [place], [place], [address] Phone: [phone]   Fax:  [phone]  Name: Kathy Clark MRN: [id number] Date of Birth: [birth year]

## 2020-09-18 NOTE — Patient Instructions (Signed)
Access Code: [email or web address] URL: [email or web address] Date: 09/18/2020 Prepared by: [name]  Exercises Supine Shoulder Flexion Extension AAROM with Dowel - 1 x daily - 7 x weekly - 3 sets - 10 reps - 3-5 seconds hold Supine Shoulder External Rotation in 45 Degrees Abduction AAROM with Dowel - 1 x daily - 7 x weekly - 3 sets - 10 reps - 3-5 seconds hold Supine Shoulder Abduction AAROM with Dowel - 1 x daily - 7 x weekly - 3 sets - 10 reps - 3-5 seconds hold Seated Scapular Retraction - 1 x daily - 7 x weekly - 3 sets - 10 reps Shoulder Rolls in Sitting - 1 x daily - 7 x weekly - 3 sets - 10 reps

## 2020-09-24 ENCOUNTER — Ambulatory Visit (INDEPENDENT_AMBULATORY_CARE_PROVIDER_SITE_OTHER): Payer: 59 | Admitting: Physical Therapy

## 2020-09-24 ENCOUNTER — Other Ambulatory Visit: Payer: Self-pay

## 2020-09-24 DIAGNOSIS — M6281 Muscle weakness (generalized): Secondary | ICD-10-CM | POA: Diagnosis not present

## 2020-09-24 DIAGNOSIS — G8929 Other chronic pain: Secondary | ICD-10-CM | POA: Diagnosis not present

## 2020-09-24 DIAGNOSIS — R29898 Other symptoms and signs involving the musculoskeletal system: Secondary | ICD-10-CM | POA: Diagnosis not present

## 2020-09-24 DIAGNOSIS — M25512 Pain in left shoulder: Secondary | ICD-10-CM

## 2020-09-24 NOTE — Therapy (Signed)
[place], [place], [address] Phone: [phone]   Fax:  [phone]  Physical Therapy Treatment  Patient Details  Name: Kathy Clark MRN: [id number] Date of Birth: 1960/11/28 Referring Provider (PT): chandler, justin   Encounter Date: 09/24/2020   PT End of Session - 09/24/20 0927    Visit Number 2    Number of Visits 12    Date for PT Re-Evaluation 10/30/20    PT Start Time 0845    PT Stop Time 0930    PT Time Calculation (min) 45 min    Activity Tolerance Patient tolerated treatment well    Behavior During Therapy [facility] for tasks assessed/performed           Past Medical History:  Diagnosis Date  . Anxiety   . Back pain   . 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 Depression   . Essential hypertension, benign   . Gastritis   . GERD (gastroesophageal reflux disease)   . Joint pain   . Lymphocytic colitis    microscopic  . Migraines   . Osteoarthritis   . Sciatica    right leg  . Swelling    feet, left foot    Past Surgical History:  Procedure Laterality Date  . ABDOMINAL HYSTERECTOMY  07-2005  . [place] Bunionectomy Left 09/18/2016   Left Foot  . BACK SURGERY    . KNEE SURGERY  2006   left; multiple  . microdiscetomy  2005  . SHOULDER SURGERY  07-22-09   right  . SKIN CANCER EXCISION     eyelid  . TOTAL KNEE ARTHROPLASTY  07/27/2011   Procedure: TOTAL KNEE ARTHROPLASTY;  Surgeon: Gearlean Alf;  Location: [facility];  Service: Orthopedics;  Laterality: Left;    There were no vitals filed for this visit.   Subjective Assessment - 09/24/20 0901    Subjective Pt states she has been performing HEP, states she still feels tight    Patient Stated Goals improve motion and reduce pain. avoid surgery    Currently in Pain? Yes    Pain Score 4     Pain Location Shoulder    Pain Orientation Left    Pain Descriptors / Indicators Aching                              [facility] Adult PT Treatment/Exercise - 09/24/20 0001      Shoulder Exercises: Supine   External Rotation AAROM;10 reps    Flexion AAROM;10 reps    ABduction AAROM;10 reps      Shoulder Exercises: Seated   Retraction Strengthening;Both;10 reps    Other Seated Exercises bilat shoulder rolls  x 10      Modalities   Modalities Electrical Stimulation      Electrical Stimulation   Electrical Stimulation Location Lt shoulder    Electrical Stimulation Action TENs    Electrical Stimulation Parameters to tolerance    Electrical Stimulation Goals Pain      Manual Therapy   Joint Mobilization grade 2-3 jt mobs A/P and inferior to Lt shoudler    Passive ROM Lt shoulder ER/IR, flexion, abduction all to tolerance                  PT Education - 09/24/20 0927    Education Details PT educated pt on use of home TENs machine including set up, precautions and  recommendations for use    Person(s) Educated Patient    Methods Explanation;Demonstration    Comprehension Verbalized understanding;Returned demonstration               PT Long Term Goals - 09/18/20 1237      PT LONG TERM GOAL #1   Title Pt will be independent with HEP    Time 6    Period Weeks    Status New    Target Date 10/30/20      PT LONG TERM GOAL #2   Title Pt will improve FOTO to >= 65 to demo functional improvement    Time 6    Period Weeks    Status New    Target Date 10/30/20      PT LONG TERM GOAL #3   Title Pt will improve Lt UE strength to 4/5 to perform IADLs with decreased pain    Time 6    Period Weeks    Status New    Target Date 10/30/20      PT LONG TERM GOAL #4   Title Pt will improve Lt shoulder ROM to within 10 degrees of Rt shoulder to perform ADLs with decreased pain    Time 6    Period Weeks    Status New    Target Date 10/30/20                 Plan - 09/24/20 0929    Clinical Impression Statement Pt responds well to manual therapy  with minimal increase in pain. Continues to be challenged by [facility] exercises. Pt with good understanding of use of home TENs machine    PT Next Visit Plan assess HEP, manual to increase ROM, progress strengthening and ROM exercises    PT Home Exercise Plan Access Code: [email or web address]    Consulted and Agree with Plan of Care Patient           Patient will benefit from skilled therapeutic intervention in order to improve the following deficits and impairments:     Visit Diagnosis: Muscle weakness (generalized)  Chronic left shoulder pain  Other symptoms and signs involving the musculoskeletal system     Problem List Patient Active Problem List   Diagnosis Date Noted  . Indication present for endocarditis prophylaxis 05/07/2020  . PVC (premature ventricular contraction) 05/07/2020  . Major depressive disorder with single episode, in partial remission ([place]) 05/07/2020  . Bursitis of left shoulder 02/08/2020  . Annual physical exam 01/10/2020  . Sleeping difficulties 01/10/2020  . Rheumatoid arthritis of multiple sites with negative rheumatoid factor ([place]) 12/04/2019  . Polyarthralgia 11/02/2019  . Myofascial pain 11/02/2019  . Arthralgia of both hands 10/03/2019  . Metatarsalgia of left foot 01/05/2018  . Other hyperlipidemia 03/23/2017  . OSA (obstructive sleep apnea) 02/25/2017  . Class 1 obesity without serious comorbidity with body mass index (BMI) of 34.0 to 34.9 in adult 08/17/2016  . Elevated liver function tests 08/17/2016  . Vitamin D deficiency 08/03/2016  . Left tennis elbow 04/06/2016  . 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 Depression 08/23/2015  . Subacromial bursitis 04/23/2015  . Left carpal tunnel syndrome, post right carpal tunnel release 12/24/2014  . Trochanteric bursitis of right hip 04/21/2013  . Postop Mild Hyponatremia 07/30/2011  . OSTEOARTHRITIS, KNEE, LEFT 12/12/2009  . MIGRAINE WITH AURA 05/31/2008  . SKIN CANCER, HX OF 01/06/2008  . COLITIS, HX OF  01/06/2008  . ALLERGIC RHINITIS 05/20/2007  . HYPERTENSION, BENIGN ESSENTIAL 11/23/2006  . SCIATICA, CHRONIC  05/19/2006   [name], PT  Jossiah Smoak 09/24/2020, 9:31 AM  [facility] [place], [place], [address] Phone: [phone]   Fax:  [phone]  Name: Kathy Clark MRN: [id number] Date of Birth: 08/21/1960

## 2020-09-25 ENCOUNTER — Other Ambulatory Visit (HOSPITAL_COMMUNITY): Payer: Self-pay | Admitting: Specialist

## 2020-09-26 ENCOUNTER — Encounter: Payer: Self-pay | Admitting: Physical Therapy

## 2020-09-26 ENCOUNTER — Ambulatory Visit (INDEPENDENT_AMBULATORY_CARE_PROVIDER_SITE_OTHER): Payer: 59 | Admitting: Physical Therapy

## 2020-09-26 DIAGNOSIS — M25512 Pain in left shoulder: Secondary | ICD-10-CM

## 2020-09-26 DIAGNOSIS — R29898 Other symptoms and signs involving the musculoskeletal system: Secondary | ICD-10-CM | POA: Diagnosis not present

## 2020-09-26 DIAGNOSIS — M6281 Muscle weakness (generalized): Secondary | ICD-10-CM

## 2020-09-26 DIAGNOSIS — G8929 Other chronic pain: Secondary | ICD-10-CM

## 2020-09-26 NOTE — Patient Instructions (Signed)
Access Code: [email or web address] URL: [email or web address] Date: 09/26/2020 Prepared by: [place]  Exercises Supine Shoulder Flexion Extension AAROM with Dowel - 2 x daily - 7 x weekly - 2 sets - 10 reps - 3-5 seconds hold Supine Shoulder External Rotation in 45 Degrees Abduction AAROM with Dowel - 1 x daily - 7 x weekly - 2 sets - 10 reps - 3-5 seconds hold Supine Shoulder Abduction AAROM with Dowel - 2 x daily - 7 x weekly - 1 sets - 10 reps - 3-5 seconds hold Seated Scapular Retraction - 2 x daily - 7 x weekly - 1 sets - 10 reps - 5-10 hold Shoulder Rolls in Sitting - 1 x daily - 7 x weekly - 1 sets - 10 reps Standing 'L' Stretch at Counter - 1 x daily - 7 x weekly - 1 sets - 5 reps - 10 seconds hold Supine Chest Stretch with Elbows Bent - 1 x daily - 7 x weekly - 1 sets - 3 reps - 20 seconds hold Sidelying Thoracic Rotation with Open Book - 1 x daily - 7 x weekly - 1 sets - 10 reps - 5-10 sec hold

## 2020-09-26 NOTE — Therapy (Signed)
[place], [place], [address] Phone: [phone]   Fax:  [phone]  Physical Therapy Treatment  Patient Details  Name: Kathy Clark MRN: [id number] Date of Birth: 1961/05/05 Referring Provider (PT): chandler, justin   Encounter Date: 09/26/2020   PT End of Session - 09/26/20 1211    Visit Number 3    Number of Visits 12    Date for PT Re-Evaluation 10/30/20    PT Start Time 1151    PT Stop Time 1229    PT Time Calculation (min) 38 min    Activity Tolerance Patient tolerated treatment well    Behavior During Therapy [facility] for tasks assessed/performed           Past Medical History:  Diagnosis Date  . Anxiety   . Back pain   . 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 Depression   . Essential hypertension, benign   . Gastritis   . GERD (gastroesophageal reflux disease)   . Joint pain   . Lymphocytic colitis    microscopic  . Migraines   . Osteoarthritis   . Sciatica    right leg  . Swelling    feet, left foot    Past Surgical History:  Procedure Laterality Date  . ABDOMINAL HYSTERECTOMY  07-2005  . [place] Bunionectomy Left 09/18/2016   Left Foot  . BACK SURGERY    . KNEE SURGERY  2006   left; multiple  . microdiscetomy  2005  . SHOULDER SURGERY  07-22-09   right  . SKIN CANCER EXCISION     eyelid  . TOTAL KNEE ARTHROPLASTY  07/27/2011   Procedure: TOTAL KNEE ARTHROPLASTY;  Surgeon: Gearlean Alf;  Location: [facility];  Service: Orthopedics;  Laterality: Left;    There were no vitals filed for this visit.   Subjective Assessment - 09/26/20 1153    Subjective "I don't feel like I'm making much progress".  Any reaching of UE increased pain to 8/10.  She reports "maybe I pushed it too hard with the exercises from the orthopedic doctors office".    Pertinent History Rt RTC repair with frozen shoulder, rheumatoid arthritis    Currently in Pain? Yes    Pain  Score 4     Pain Location Shoulder    Pain Orientation Left    Pain Descriptors / Indicators Aching    Aggravating Factors  reaching    Pain Relieving Factors heat, tylenol              [facility] PT Assessment - 09/26/20 0001      Assessment   Medical Diagnosis left frozen shoulder    Referring Provider (PT) chandler, justin    Onset Date/Surgical Date 03/13/20    Hand Dominance Right      ROM / Strength   AROM / PROM / Strength PROM      AROM   Left Shoulder Extension 52 Degrees      PROM   PROM Assessment Site Shoulder    Right/Left Shoulder Left    Left Shoulder External Rotation 75 Degrees             [facility] Adult PT Treatment/Exercise - 09/26/20 0001      Shoulder Exercises: Supine   External Rotation AAROM;10 reps   arm propped in scaption, cane   Flexion Limitations 1 rep for demo of form for HEP.    ABduction AAROM;Left;10 reps   cane  Shoulder Exercises: Seated   Other Seated Exercises bilat shoulder rolls  x 10      Shoulder Exercises: Sidelying   Other Sidelying Exercises Lt thoracic rotation in open book x 8 reps, holding 5-10 sec, cues to breathe.      Shoulder Exercises: Standing   Internal Rotation AAROM;Both;10 reps    Extension AAROM;Both;10 reps      Shoulder Exercises: ROM/Strengthening   Nustep L3: arms/legs x 5 min for warm up.      Shoulder Exercises: Stretch   Internal Rotation Stretch --   3 reps with strap, 20 sec   Table Stretch - Flexion 5 reps;10 seconds    Star Gazer Stretch 3 reps;20 seconds   Lt elbow propped on pillow     Modalities   Modalities --   held; will use TENS at home.                 PT Education - 09/26/20 1236    Education Details HEP updated    Person(s) Educated Patient    Methods Explanation;Demonstration;Handout;Verbal cues    Comprehension Verbalized understanding;Returned demonstration               PT Long Term Goals - 09/18/20 1237      PT LONG TERM GOAL #1   Title Pt will be  independent with HEP    Time 6    Period Weeks    Status New    Target Date 10/30/20      PT LONG TERM GOAL #2   Title Pt will improve FOTO to >= 65 to demo functional improvement    Time 6    Period Weeks    Status New    Target Date 10/30/20      PT LONG TERM GOAL #3   Title Pt will improve Lt UE strength to 4/5 to perform IADLs with decreased pain    Time 6    Period Weeks    Status New    Target Date 10/30/20      PT LONG TERM GOAL #4   Title Pt will improve Lt shoulder ROM to within 10 degrees of Rt shoulder to perform ADLs with decreased pain    Time 6    Period Weeks    Status New    Target Date 10/30/20                 Plan - 09/26/20 1231    Clinical Impression Statement Pt continues to report increased pain with AAROM into available end ranges; reduced with rest.  Pt demonstrated improved Lt shoulder ER and abdct ROM today.  Updated HEP.    Rehab Potential Good    PT Frequency 2x / week    PT Duration 6 weeks    PT Treatment/Interventions ADLs/Self Care Home Management;Cryotherapy;Moist Heat;Iontophoresis 4mg /ml Dexamethasone;Electrical Stimulation;Functional mobility training;Neuromuscular re-education;Therapeutic exercise;Therapeutic activities;Patient/family education;Manual techniques;Passive range of motion;Dry needling;Taping    PT Next Visit Plan manual to increase ROM, progress strengthening and ROM exercises for Lt shoulder    PT Home Exercise Plan Access Code: [email or web address]    Consulted and Agree with Plan of Care Patient           Patient will benefit from skilled therapeutic intervention in order to improve the following deficits and impairments:  Hypomobility,Pain,Impaired UE functional use,Decreased strength,Decreased activity tolerance,Decreased range of motion  Visit Diagnosis: Muscle weakness (generalized)  Chronic left shoulder pain  Other symptoms and signs involving the musculoskeletal system  Problem List Patient Active  Problem List   Diagnosis Date Noted  . Indication present for endocarditis prophylaxis 05/07/2020  . PVC (premature ventricular contraction) 05/07/2020  . Major depressive disorder with single episode, in partial remission ([place]) 05/07/2020  . Bursitis of left shoulder 02/08/2020  . Annual physical exam 01/10/2020  . Sleeping difficulties 01/10/2020  . Rheumatoid arthritis of multiple sites with negative rheumatoid factor ([place]) 12/04/2019  . Polyarthralgia 11/02/2019  . Myofascial pain 11/02/2019  . Arthralgia of both hands 10/03/2019  . Metatarsalgia of left foot 01/05/2018  . Other hyperlipidemia 03/23/2017  . OSA (obstructive sleep apnea) 02/25/2017  . Class 1 obesity without serious comorbidity with body mass index (BMI) of 34.0 to 34.9 in adult 08/17/2016  . Elevated liver function tests 08/17/2016  . Vitamin D deficiency 08/03/2016  . Left tennis elbow 04/06/2016  . 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 Depression 08/23/2015  . Subacromial bursitis 04/23/2015  . Left carpal tunnel syndrome, post right carpal tunnel release 12/24/2014  . Trochanteric bursitis of right hip 04/21/2013  . Postop Mild Hyponatremia 07/30/2011  . OSTEOARTHRITIS, KNEE, LEFT 12/12/2009  . MIGRAINE WITH AURA 05/31/2008  . SKIN CANCER, HX OF 01/06/2008  . COLITIS, HX OF 01/06/2008  . ALLERGIC RHINITIS 05/20/2007  . HYPERTENSION, BENIGN ESSENTIAL 11/23/2006  . SCIATICA, CHRONIC 05/19/2006   Kerin Perna, PTA 09/26/20 12:36 PM  [place], [place], [address] Phone: [phone]   Fax:  [phone]  Name: Kathy Clark MRN: [id number] Date of Birth: [birth year]

## 2020-09-27 ENCOUNTER — Encounter: Payer: 59 | Admitting: Physical Therapy

## 2020-09-27 DIAGNOSIS — M67912 Unspecified disorder of synovium and tendon, left shoulder: Secondary | ICD-10-CM | POA: Diagnosis not present

## 2020-09-27 DIAGNOSIS — M24812 Other specific joint derangements of left shoulder, not elsewhere classified: Secondary | ICD-10-CM | POA: Diagnosis not present

## 2020-09-30 ENCOUNTER — Other Ambulatory Visit: Payer: Self-pay | Admitting: Family Medicine

## 2020-09-30 MED FILL — VIIBRYD 20 MG TABLET: 20 | 90 days supply | Qty: 90 | Fill #0

## 2020-10-01 ENCOUNTER — Other Ambulatory Visit: Payer: Self-pay

## 2020-10-01 ENCOUNTER — Ambulatory Visit (INDEPENDENT_AMBULATORY_CARE_PROVIDER_SITE_OTHER): Payer: 59 | Admitting: Physical Therapy

## 2020-10-01 DIAGNOSIS — M6281 Muscle weakness (generalized): Secondary | ICD-10-CM | POA: Diagnosis not present

## 2020-10-01 DIAGNOSIS — R29898 Other symptoms and signs involving the musculoskeletal system: Secondary | ICD-10-CM | POA: Diagnosis not present

## 2020-10-01 DIAGNOSIS — G8929 Other chronic pain: Secondary | ICD-10-CM | POA: Diagnosis not present

## 2020-10-01 DIAGNOSIS — M25512 Pain in left shoulder: Secondary | ICD-10-CM | POA: Diagnosis not present

## 2020-10-01 NOTE — Patient Instructions (Signed)
Access Code: [email or web address] URL: [email or web address] Date: 10/01/2020 Prepared by: [name]  Exercises Supine Shoulder Flexion Extension AAROM with [place] - 2 x daily - 7 x weekly - 2 sets - 10 reps - 3-5 seconds hold Supine Shoulder External Rotation in 45 Degrees Abduction AAROM with Dowel - 1 x daily - 7 x weekly - 2 sets - 10 reps - 3-5 seconds hold Supine Shoulder Abduction AAROM with Dowel - 2 x daily - 7 x weekly - 1 sets - 10 reps - 3-5 seconds hold Standing 'L' Stretch at [employer] - 1 x daily - 7 x weekly - 1 sets - 5 reps - 10 seconds hold Supine Chest Stretch with Elbows Bent - 1 x daily - 7 x weekly - 1 sets - 3 reps - 20 seconds hold Sidelying Thoracic Rotation with Open Book - 1 x daily - 7 x weekly - 1 sets - 10 reps - 5-10 sec hold Standing Bilateral Low Shoulder Row with Anchored Resistance - 1 x daily - 7 x weekly - 3 sets - 10 reps Supine Scapular Protraction in Flexion with Dumbbells - 1 x daily - 7 x weekly - 3 sets - 10 reps

## 2020-10-01 NOTE — Therapy (Signed)
[place], [place], [address] Phone: [phone]   Fax:  [phone]  Physical Therapy Treatment  Patient Details  Name: Kathy Clark MRN: [id number] Date of Birth: 07/23/1960 Referring Provider (PT): chandler, justin   Encounter Date: 10/01/2020   PT End of Session - 10/01/20 0928    Visit Number 4    Number of Visits 12    Date for PT Re-Evaluation 10/30/20    PT Start Time 0846    PT Stop Time 0927    PT Time Calculation (min) 41 min    Activity Tolerance Patient tolerated treatment well    Behavior During Therapy [facility] for tasks assessed/performed           Past Medical History:  Diagnosis Date  . Anxiety   . Back pain   . 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 Depression   . Essential hypertension, benign   . Gastritis   . GERD (gastroesophageal reflux disease)   . Joint pain   . Lymphocytic colitis    microscopic  . Migraines   . Osteoarthritis   . Sciatica    right leg  . Swelling    feet, left foot    Past Surgical History:  Procedure Laterality Date  . ABDOMINAL HYSTERECTOMY  07-2005  . [place] Bunionectomy Left 09/18/2016   Left Foot  . BACK SURGERY    . KNEE SURGERY  2006   left; multiple  . microdiscetomy  2005  . SHOULDER SURGERY  07-22-09   right  . SKIN CANCER EXCISION     eyelid  . TOTAL KNEE ARTHROPLASTY  07/27/2011   Procedure: TOTAL KNEE ARTHROPLASTY;  Surgeon: Gearlean Alf;  Location[facility];  Service: Orthopedics;  Laterality: Left;    There were no vitals filed for this visit.   Subjective Assessment - 10/01/20 0852    Subjective Pt states "It might be a little better".  Pt states it still hurts "all the time"    Patient Stated Goals improve motion and reduce pain. avoid surgery    Currently in Pain? Yes    Pain Score 4     Pain Location Shoulder    Pain Orientation Left    Pain Descriptors / Indicators Aching                              [facility] Adult PT Treatment/Exercise - 10/01/20 0001      Shoulder Exercises: Supine   Protraction Strengthening;Left;20 reps      Shoulder Exercises: Sidelying   External Rotation Strengthening;Left;15 reps    Other Sidelying Exercises thoracic open book 10 x 10 sec Lt      Shoulder Exercises: Standing   Extension 15 reps    Theraband Level (Shoulder Extension) Level 2 (Red)    Row Both;15 reps    Theraband Level (Shoulder Row) Level 2 (Red)      Shoulder Exercises: ROM/Strengthening   UBE (Upper Arm Bike) 4 min alt fwd/bkwd      Shoulder Exercises: Stretch   Table Stretch - Flexion 5 reps;10 seconds    Star Gazer Stretch 3 reps;20 seconds    Other Shoulder Stretches doorway stretch 90 degrees (painful-only able to tolerate x 10 sec)      Manual Therapy   Joint Mobilization grade 2-3 jt mobs inferior, A/P to Lt shoulder    Passive ROM Lt shoulder  flexion and IR to tolerance                       PT Long Term Goals - 09/18/20 1237      PT LONG TERM GOAL #1   Title Pt will be independent with HEP    Time 6    Period Weeks    Status New    Target Date 10/30/20      PT LONG TERM GOAL #2   Title Pt will improve FOTO to >= 65 to demo functional improvement    Time 6    Period Weeks    Status New    Target Date 10/30/20      PT LONG TERM GOAL #3   Title Pt will improve Lt UE strength to 4/5 to perform IADLs with decreased pain    Time 6    Period Weeks    Status New    Target Date 10/30/20      PT LONG TERM GOAL #4   Title Pt will improve Lt shoulder ROM to within 10 degrees of Rt shoulder to perform ADLs with decreased pain    Time 6    Period Weeks    Status New    Target Date 10/30/20                 Plan - 10/01/20 0929    Clinical Impression Statement Pt with improving ROM, continues with constant pain which wakes her up at night. pt reports MD is pleased with progress. Pt able to tolerate addition  of scapular strengthening exercises this session    PT Next Visit Plan continue postural strengthening. manual for ROM    PT Home Exercise Plan Access Code: [email or web address]    [email or web address] and Agree with Plan of Care Patient           Patient will benefit from skilled therapeutic intervention in order to improve the following deficits and impairments:     Visit Diagnosis: Muscle weakness (generalized)  Chronic left shoulder pain  Other symptoms and signs involving the musculoskeletal system     Problem List Patient Active Problem List   Diagnosis Date Noted  . Indication present for endocarditis prophylaxis 05/07/2020  . PVC (premature ventricular contraction) 05/07/2020  . Major depressive disorder with single episode, in partial remission ([place]) 05/07/2020  . Bursitis of left shoulder 02/08/2020  . Annual physical exam 01/10/2020  . Sleeping difficulties 01/10/2020  . Rheumatoid arthritis of multiple sites with negative rheumatoid factor ([place]) 12/04/2019  . Polyarthralgia 11/02/2019  . Myofascial pain 11/02/2019  . Arthralgia of both hands 10/03/2019  . Metatarsalgia of left foot 01/05/2018  . Other hyperlipidemia 03/23/2017  . OSA (obstructive sleep apnea) 02/25/2017  . Class 1 obesity without serious comorbidity with body mass index (BMI) of 34.0 to 34.9 in adult 08/17/2016  . Elevated liver function tests 08/17/2016  . Vitamin D deficiency 08/03/2016  . Left tennis elbow 04/06/2016  . 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 Depression 08/23/2015  . Subacromial bursitis 04/23/2015  . Left carpal tunnel syndrome, post right carpal tunnel release 12/24/2014  . Trochanteric bursitis of right hip 04/21/2013  . Postop Mild Hyponatremia 07/30/2011  . OSTEOARTHRITIS, KNEE, LEFT 12/12/2009  . MIGRAINE WITH AURA 05/31/2008  . SKIN CANCER, HX OF 01/06/2008  . COLITIS, HX OF 01/06/2008  . ALLERGIC RHINITIS 05/20/2007  . HYPERTENSION, BENIGN ESSENTIAL 11/23/2006  . SCIATICA, CHRONIC  05/19/2006   Kathy Clark, PT  Kathy Clark 10/01/2020, 9:31  AM  [facility] [place], [place], [address] Phone: [phone]   Fax:  [phone]  Name: Kathy Clark MRN: [id number] Date of Birth: 05-05-1961

## 2020-10-04 ENCOUNTER — Encounter: Payer: 59 | Admitting: Physical Therapy

## 2020-10-08 ENCOUNTER — Other Ambulatory Visit: Payer: Self-pay

## 2020-10-08 ENCOUNTER — Ambulatory Visit (INDEPENDENT_AMBULATORY_CARE_PROVIDER_SITE_OTHER): Payer: 59 | Admitting: Physical Therapy

## 2020-10-08 DIAGNOSIS — M6281 Muscle weakness (generalized): Secondary | ICD-10-CM

## 2020-10-08 DIAGNOSIS — G8929 Other chronic pain: Secondary | ICD-10-CM

## 2020-10-08 DIAGNOSIS — M25512 Pain in left shoulder: Secondary | ICD-10-CM | POA: Diagnosis not present

## 2020-10-08 DIAGNOSIS — R29898 Other symptoms and signs involving the musculoskeletal system: Secondary | ICD-10-CM

## 2020-10-08 NOTE — Patient Instructions (Signed)
Access Code: [email or web address] URL: [email or web address] Date: 10/08/2020 Prepared by: [name]  Exercises Supine Chest Stretch with Elbows Bent - 1 x daily - 7 x weekly - 1 sets - 3 reps - 20 seconds hold Supine Scapular Protraction in Flexion with Dumbbells - 1 x daily - 7 x weekly - 3 sets - 10 reps Standing Bilateral Low Shoulder Row with Anchored Resistance - 1 x daily - 7 x weekly - 3 sets - 10 reps Seated Shoulder W External Rotation on Swiss Ball - 1 x daily - 7 x weekly - 3 sets - 10 reps Shoulder extension with resistance - Neutral - 1 x daily - 7 x weekly - 3 sets - 10 reps

## 2020-10-08 NOTE — Therapy (Signed)
[place], [place], [address] Phone: [phone]   Fax:  [phone]  Physical Therapy Treatment  Patient Details  Name: Kathy Clark MRN: [id number] Date of Birth: 1960-10-17 Referring Provider (PT): chandler, justin   Encounter Date: 10/08/2020   PT End of Session - 10/08/20 0846    Visit Number 5    Number of Visits 12    Date for PT Re-Evaluation 10/30/20    PT Start Time 0803    PT Stop Time 0842    PT Time Calculation (min) 39 min    Activity Tolerance Patient tolerated treatment well    Behavior During Therapy [facility] for tasks assessed/performed           Past Medical History:  Diagnosis Date  . Anxiety   . Back pain   . 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 Depression   . Essential hypertension, benign   . Gastritis   . GERD (gastroesophageal reflux disease)   . Joint pain   . Lymphocytic colitis    microscopic  . Migraines   . Osteoarthritis   . Sciatica    right leg  . Swelling    feet, left foot    Past Surgical History:  Procedure Laterality Date  . ABDOMINAL HYSTERECTOMY  07-2005  . [place] Bunionectomy Left 09/18/2016   Left Foot  . BACK SURGERY    . KNEE SURGERY  2006   left; multiple  . microdiscetomy  2005  . SHOULDER SURGERY  07-22-09   right  . SKIN CANCER EXCISION     eyelid  . TOTAL KNEE ARTHROPLASTY  07/27/2011   Procedure: TOTAL KNEE ARTHROPLASTY;  Surgeon: Gearlean Alf;  [facility];  Service: Orthopedics;  Laterality: Left;    There were no vitals filed for this visit.   Subjective Assessment - 10/08/20 0807    Subjective Pt states she feels her range of motion is improving but she still has pain    Patient Stated Goals improve motion and reduce pain. avoid surgery    Currently in Pain? Yes    Pain Score 7     Pain Location Shoulder    Pain Orientation Left    Pain Descriptors / Indicators Aching                              [facility] Adult PT Treatment/Exercise - 10/08/20 0001      Shoulder Exercises: Supine   Protraction Strengthening;Left;20 reps    Protraction Weight (lbs) 2#      Shoulder Exercises: Seated   Other Seated Exercises bilat shoulder ER      Shoulder Exercises: Standing   Extension 20 reps    Theraband Level (Shoulder Extension) Level 2 (Red)    Row 20 reps    Theraband Level (Shoulder Row) Level 2 (Red)      Shoulder Exercises: ROM/Strengthening   UBE (Upper Arm Bike) 4 min alt fwd/bkwd      Shoulder Exercises: Stretch   Star Gazer Stretch 3 reps;20 seconds      Manual Therapy   Joint Mobilization grade 2-3 jt mobs A/P to improve ROM    Passive ROM Lt shoulder ER/IR to tolerance                  PT Education - 10/08/20 0846    Education Details updated HEP  PT Long Term Goals - 09/18/20 1237      PT LONG TERM GOAL #1   Title Pt will be independent with HEP    Time 6    Period Weeks    Status New    Target Date 10/30/20      PT LONG TERM GOAL #2   Title Pt will improve FOTO to >= 65 to demo functional improvement    Time 6    Period Weeks    Status New    Target Date 10/30/20      PT LONG TERM GOAL #3   Title Pt will improve Lt UE strength to 4/5 to perform IADLs with decreased pain    Time 6    Period Weeks    Status New    Target Date 10/30/20      PT LONG TERM GOAL #4   Title Pt will improve Lt shoulder ROM to within 10 degrees of Rt shoulder to perform ADLs with decreased pain    Time 6    Period Weeks    Status New    Target Date 10/30/20                 Plan - 10/08/20 0847    Clinical Impression Statement Pt continues with c/o pain and states she plans to call MD to schedule surgery.  Exercises adjusted to focus on strengthening to prepare for upcoming surgery    PT Next Visit Plan shoulder and scapular strengthening to prepare for surgery    PT Home Exercise Plan Access Code:  [email or web address]    Consulted and Agree with Plan of Care Patient           Patient will benefit from skilled therapeutic intervention in order to improve the following deficits and impairments:     Visit Diagnosis: Chronic left shoulder pain  Muscle weakness (generalized)  Other symptoms and signs involving the musculoskeletal system     Problem List Patient Active Problem List   Diagnosis Date Noted  . Indication present for endocarditis prophylaxis 05/07/2020  . PVC (premature ventricular contraction) 05/07/2020  . Major depressive disorder with single episode, in partial remission ([place]) 05/07/2020  . Bursitis of left shoulder 02/08/2020  . Annual physical exam 01/10/2020  . Sleeping difficulties 01/10/2020  . Rheumatoid arthritis of multiple sites with negative rheumatoid factor ([place]) 12/04/2019  . Polyarthralgia 11/02/2019  . Myofascial pain 11/02/2019  . Arthralgia of both hands 10/03/2019  . Metatarsalgia of left foot 01/05/2018  . Other hyperlipidemia 03/23/2017  . OSA (obstructive sleep apnea) 02/25/2017  . Class 1 obesity without serious comorbidity with body mass index (BMI) of 34.0 to 34.9 in adult 08/17/2016  . Elevated liver function tests 08/17/2016  . Vitamin D deficiency 08/03/2016  . Left tennis elbow 04/06/2016  . 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 Depression 08/23/2015  . Subacromial bursitis 04/23/2015  . Left carpal tunnel syndrome, post right carpal tunnel release 12/24/2014  . Trochanteric bursitis of right hip 04/21/2013  . Postop Mild Hyponatremia 07/30/2011  . OSTEOARTHRITIS, KNEE, LEFT 12/12/2009  . MIGRAINE WITH AURA 05/31/2008  . SKIN CANCER, HX OF 01/06/2008  . COLITIS, HX OF 01/06/2008  . ALLERGIC RHINITIS 05/20/2007  . HYPERTENSION, BENIGN ESSENTIAL 11/23/2006  . SCIATICA, CHRONIC 05/19/2006   Kathy Clark, PT  Kathy Clark 10/08/2020, 8:53 AM  [facility] [place], [place], [address] Phone: [phone]   Fax:  [phone]  Name: Kathy Clark MRN: [id number]  Date of Birth: 1961/02/21

## 2020-10-09 DIAGNOSIS — G4733 Obstructive sleep apnea (adult) (pediatric): Secondary | ICD-10-CM | POA: Diagnosis not present

## 2020-10-09 MED FILL — CYCLOBENZAPRINE HCL 5 MG TA: 5 | 30 days supply | Qty: 30 | Fill #2

## 2020-10-10 ENCOUNTER — Other Ambulatory Visit (HOSPITAL_COMMUNITY): Payer: Self-pay

## 2020-10-11 ENCOUNTER — Other Ambulatory Visit: Payer: Self-pay

## 2020-10-11 ENCOUNTER — Ambulatory Visit (INDEPENDENT_AMBULATORY_CARE_PROVIDER_SITE_OTHER): Payer: 59 | Admitting: Physical Therapy

## 2020-10-11 ENCOUNTER — Encounter: Payer: Self-pay | Admitting: Physical Therapy

## 2020-10-11 DIAGNOSIS — R29898 Other symptoms and signs involving the musculoskeletal system: Secondary | ICD-10-CM | POA: Diagnosis not present

## 2020-10-11 DIAGNOSIS — M25512 Pain in left shoulder: Secondary | ICD-10-CM

## 2020-10-11 DIAGNOSIS — M6281 Muscle weakness (generalized): Secondary | ICD-10-CM | POA: Diagnosis not present

## 2020-10-11 DIAGNOSIS — G8929 Other chronic pain: Secondary | ICD-10-CM | POA: Diagnosis not present

## 2020-10-11 NOTE — Therapy (Signed)
[facility] [place] [address] [place], [place], [address] Phone: [phone]   Fax:  [phone]  Physical Therapy Treatment  Patient Details  Name: Kathy Clark MRN: [id number] Date of Birth: [birth year] Referring Provider (PT): chandler, justin   Encounter Date: 10/11/2020   PT End of Session - 10/11/20 0811    Visit Number 6    Number of Visits 12    Date for PT Re-Evaluation 10/30/20    PT Start Time 0803    PT Stop Time 0845    PT Time Calculation (min) 42 min    Activity Tolerance Patient tolerated treatment well    Behavior During Therapy [facility] for tasks assessed/performed           Past Medical History:  Diagnosis Date  . Anxiety   . Back pain   . 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 Depression   . Essential hypertension, benign   . Gastritis   . GERD (gastroesophageal reflux disease)   . Joint pain   . Lymphocytic colitis    microscopic  . Migraines   . Osteoarthritis   . Sciatica    right leg  . Swelling    feet, left foot    Past Surgical History:  Procedure Laterality Date  . ABDOMINAL HYSTERECTOMY  07-2005  . [place] Bunionectomy Left 09/18/2016   Left Foot  . BACK SURGERY    . KNEE SURGERY  2006   left; multiple  . microdiscetomy  2005  . SHOULDER SURGERY  07-22-09   right  . SKIN CANCER EXCISION     eyelid  . TOTAL KNEE ARTHROPLASTY  07/27/2011   Procedure: TOTAL KNEE ARTHROPLASTY;  Surgeon: Loanne Drilling;  Location: [facility];  Service: Orthopedics;  Laterality: Left;    There were no vitals filed for this visit.   Subjective Assessment - 10/11/20 0810    Subjective "I am tired of the pain."  Pt states she is interested in returning to Ortho surgeon for surgery consult.    Pertinent History Rt RTC repair with frozen shoulder, rheumatoid arthritis    Currently in Pain? Yes    Pain Score 4     Pain Location Shoulder    Pain Orientation Left    Pain Descriptors  / Indicators Aching;Lambert Mody              [facility] PT Assessment - 10/11/20 0001      Assessment   Medical Diagnosis left frozen shoulder    Referring Provider (PT) chandler, justin    Onset Date/Surgical Date 03/13/20    Hand Dominance Right      Observation/Other Assessments   Focus on Therapeutic Outcomes (FOTO)  63 functional status measure (goal of 65      AROM   Left Shoulder Extension 50 Degrees    Left Shoulder Flexion 149 Degrees    Left Shoulder ABduction 140 Degrees    Left Shoulder Internal Rotation --   thumb to L2   Left Shoulder External Rotation 80 Degrees   scaption of 90 deg     Strength   Left Shoulder Flexion 4+/5    Left Shoulder Extension 5/5    Left Shoulder ABduction 4+/5    Left Shoulder Internal Rotation 4/5    Left Shoulder External Rotation 4-/5   with pain           [facility] Adult PT Treatment/Exercise - 10/11/20 0001      Shoulder Exercises:  Supine   Other Supine Exercises Lt D2 flexion with yellow band x 15.      Shoulder Exercises: Seated   Extension Both;12 reps    Theraband Level (Shoulder Extension) Level 1 (Yellow)    Row Strengthening;Both;15 reps    Theraband Level (Shoulder Row) Level 2 (Red)    External Rotation Strengthening;Both;15 reps    Theraband Level (Shoulder External Rotation) Level 2 (Red)    Other Seated Exercises shoulder rolls backwards x 10      Shoulder Exercises: Sidelying   Other Sidelying Exercises thoracic rotation, open book with/without yellow band x 10 each side.      Shoulder Exercises: Pulleys   Flexion 3 minutes    ABduction 2 minutes      Shoulder Exercises: Stretch   Corner Stretch 2 reps;10 seconds    Table Stretch - Flexion 2 reps;10 seconds   L - at [employer] Stretch 1 rep;20 seconds    Other Shoulder Stretches Lt shoulder ext stretch holding counter x 15 sec x 2 reps                       PT Long Term Goals - 10/11/20 1898      PT LONG TERM GOAL #1   Title Pt will  be independent with HEP    Time 6    Period Weeks    Status Achieved      PT LONG TERM GOAL #2   Title Pt will improve FOTO to >= 65 to demo functional improvement    Baseline 63- 10/11/20    Time 6    Period Weeks    Status On-going      PT LONG TERM GOAL #3   Title Pt will improve Lt UE strength to 4/5 to perform IADLs with decreased pain    Time 6    Period Weeks    Status Partially Met      PT LONG TERM GOAL #4   Title Pt will improve Lt shoulder ROM to within 10 degrees of Rt shoulder to perform ADLs with decreased pain    Time 6    Period Weeks    Status Partially Met                 Plan - 10/11/20 4210    Clinical Impression Statement Pt demonstrates improved Lt shoulder strength and ROM, however continues with persistant pain. Pt reported slight increase in Lt shoulder pain after completion of ER stretches and band exercise.  Pt has partially met her goals and requests to hold therapy while she continues HEP, and returns to surgeon for consult.    Rehab Potential Good    PT Frequency 2x / week    PT Duration 6 weeks    PT Treatment/Interventions ADLs/Self Care Home Management;Cryotherapy;Moist Heat;Iontophoresis 61m/ml Dexamethasone;Electrical Stimulation;Functional mobility training;Neuromuscular re-education;Therapeutic exercise;Therapeutic activities;Patient/family education;Manual techniques;Passive range of motion;Dry needling;Taping    PT Next Visit Plan will hold until 11/01/20; if pt doesn't return, will d/c.    PT Home Exercise Plan Access Code: [email or web address]   Consulted and Agree with Plan of Care Patient           Patient will benefit from skilled therapeutic intervention in order to improve the following deficits and impairments:  Hypomobility,Pain,Impaired UE functional use,Decreased strength,Decreased activity tolerance,Decreased range of motion  Visit Diagnosis: Chronic left shoulder pain  Muscle weakness (generalized)  Other symptoms and signs  involving the musculoskeletal system  Problem List Patient Active Problem List   Diagnosis Date Noted  . Indication present for endocarditis prophylaxis 05/07/2020  . PVC (premature ventricular contraction) 05/07/2020  . Major depressive disorder with single episode, in partial remission ([place]) 05/07/2020  . Bursitis of left shoulder 02/08/2020  . Annual physical exam 01/10/2020  . Sleeping difficulties 01/10/2020  . Rheumatoid arthritis of multiple sites with negative rheumatoid factor ([place]) 12/04/2019  . Polyarthralgia 11/02/2019  . Myofascial pain 11/02/2019  . Arthralgia of both hands 10/03/2019  . Metatarsalgia of left foot 01/05/2018  . Other hyperlipidemia 03/23/2017  . OSA (obstructive sleep apnea) 02/25/2017  . Class 1 obesity without serious comorbidity with body mass index (BMI) of 34.0 to 34.9 in adult 08/17/2016  . Elevated liver function tests 08/17/2016  . Vitamin D deficiency 08/03/2016  . Left tennis elbow 04/06/2016  . 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 Depression 08/23/2015  . Subacromial bursitis 04/23/2015  . Left carpal tunnel syndrome, post right carpal tunnel release 12/24/2014  . Trochanteric bursitis of right hip 04/21/2013  . Postop Mild Hyponatremia 07/30/2011  . OSTEOARTHRITIS, KNEE, LEFT 12/12/2009  . MIGRAINE WITH AURA 05/31/2008  . SKIN CANCER, HX OF 01/06/2008  . COLITIS, HX OF 01/06/2008  . ALLERGIC RHINITIS 05/20/2007  . HYPERTENSION, BENIGN ESSENTIAL 11/23/2006  . SCIATICA, CHRONIC 05/19/2006   Kerin Perna, PTA 10/11/20 9:15 AM  [address] [place], [place], [address] Phone: [phone]   Fax:  [phone]  Name: Kathy Clark MRN: [id number] Date of Birth: 1961/04/23

## 2020-10-11 NOTE — Patient Instructions (Signed)
Access Code: [email or web address] URL: [email or web address] Date: 10/11/2020 Prepared by: Strawn  Exercises Standing Bilateral Low Shoulder Row with Anchored Resistance - 1 x daily - 3 x weekly - 2 sets - 10 reps Seated Shoulder W External Rotation on Swiss Ball - 1 x daily - 3 x weekly - 2 sets - 10 reps Shoulder extension with resistance - Neutral - 1 x daily - 3 x weekly - 2 sets - 10 reps Standing Shoulder Single Arm PNF D2 Flexion with Resistance - 1 x daily - 7 x weekly - 1 sets - 10 reps Standing 'L' Stretch at Counter - 2 x daily - 7 x weekly - 3 sets - 2-3 reps - 15 sec hold Corner Pec Major Stretch - 2 x daily - 7 x weekly - 1 sets - 2-3 reps - 15-20 seconds hold Supine Chest Stretch with Elbows Bent - 1 x daily - 7 x weekly - 1 sets - 3 reps - 20 seconds hold Sidelying Thoracic Rotation with Open Book - 1 x daily - 7 x weekly - 1 sets - 5-10 reps - 5 seconds hold

## 2020-10-12 ENCOUNTER — Other Ambulatory Visit (HOSPITAL_COMMUNITY): Payer: Self-pay

## 2020-10-12 MED FILL — Etanercept Subcutaneous Solution Auto-injector 50 MG/ML: SUBCUTANEOUS | 28 days supply | Qty: 4 | Fill #0 | Status: AC

## 2020-10-13 ENCOUNTER — Other Ambulatory Visit (HOSPITAL_COMMUNITY): Payer: Self-pay

## 2020-10-14 ENCOUNTER — Other Ambulatory Visit (HOSPITAL_COMMUNITY): Payer: Self-pay

## 2020-10-16 ENCOUNTER — Other Ambulatory Visit (HOSPITAL_COMMUNITY): Payer: Self-pay

## 2020-10-16 DIAGNOSIS — M0609 Rheumatoid arthritis without rheumatoid factor, multiple sites: Secondary | ICD-10-CM | POA: Diagnosis not present

## 2020-10-16 DIAGNOSIS — M25812 Other specified joint disorders, left shoulder: Secondary | ICD-10-CM | POA: Diagnosis not present

## 2020-10-16 DIAGNOSIS — M67912 Unspecified disorder of synovium and tendon, left shoulder: Secondary | ICD-10-CM | POA: Diagnosis not present

## 2020-10-16 DIAGNOSIS — Z79899 Other long term (current) drug therapy: Secondary | ICD-10-CM | POA: Diagnosis not present

## 2020-10-22 ENCOUNTER — Encounter: Payer: Self-pay | Admitting: Family Medicine

## 2020-10-22 ENCOUNTER — Telehealth: Payer: Self-pay | Admitting: Cardiology

## 2020-10-22 NOTE — Telephone Encounter (Signed)
Kathy Clark is calling to schedule her 6 month f/u with Dr. Stanford Breed. She has been scheduled for his first available in office on 01/03/21 in the [place] office. Chelli is wanting to know if Dr. Stanford Breed would be okay with this appt being a virtual so she can have a sooner appt closer to her due date in May. If so she is requesting a callback to discuss with a nurse and reschedule. Please advise.

## 2020-10-22 NOTE — Telephone Encounter (Signed)
Returned call to patient left message on personal voice mail Dr.Crenshaw's RN is out of office this week.I will send message to her for a possible virtual appointment.

## 2020-10-22 NOTE — Telephone Encounter (Signed)
She will need an appt for surgical clearance.  Kathy Clark has form and was going to have her schedule.

## 2020-10-22 NOTE — Telephone Encounter (Signed)
Patient is scheduled with Joy on 4/13 at 11:10 - CF

## 2020-10-22 NOTE — Telephone Encounter (Deleted)
Accidentally closed this message   Please schedule this patient for surgical Clearance   CF

## 2020-10-23 ENCOUNTER — Other Ambulatory Visit: Payer: Self-pay

## 2020-10-23 ENCOUNTER — Ambulatory Visit: Payer: 59 | Admitting: Medical-Surgical

## 2020-10-23 ENCOUNTER — Encounter: Payer: Self-pay | Admitting: Medical-Surgical

## 2020-10-23 VITALS — BP 107/75 | HR 66 | Temp 98.7°F | Ht 68.0 in | Wt 251.7 lb

## 2020-10-23 DIAGNOSIS — I493 Ventricular premature depolarization: Secondary | ICD-10-CM | POA: Diagnosis not present

## 2020-10-23 DIAGNOSIS — Z01818 Encounter for other preprocedural examination: Secondary | ICD-10-CM

## 2020-10-23 DIAGNOSIS — I472 Ventricular tachycardia: Secondary | ICD-10-CM | POA: Diagnosis not present

## 2020-10-23 DIAGNOSIS — I4729 Other ventricular tachycardia: Secondary | ICD-10-CM

## 2020-10-23 NOTE — Progress Notes (Signed)
Subjective:    CC: pre-op clearance  HPI: Pleasant 60 year old female presenting today for pre-operative clearance for a left shoulder arthroscopy with rotator cuff debridement , subacromial decompression, and distal clavicle excision while under anesthesia. Her surgery is scheduled for 10/29/2020 and her PCP is out of the office until that date. Does have RA and is currently taking Rasuvo weekly. She has already spoken to rheumatology regarding medications to hold. Reports that she is able to climb at least 4 flights of stairs without chest pain although she does find herself breathing hard since she does not regularly exercise. Denies palpitations and SOB.   I reviewed the past medical history, family history, social history, surgical history, and allergies today and no changes were needed.  Please see the problem list section below in epic for further details.  Past Medical History: Past Medical History:  Diagnosis Date  . Anxiety   . Back pain   . 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 Depression   . Essential hypertension, benign   . Gastritis   . GERD (gastroesophageal reflux disease)   . Joint pain   . Lymphocytic colitis    microscopic  . Migraines   . Osteoarthritis   . Sciatica    right leg  . Swelling    feet, left foot   Past Surgical History: Past Surgical History:  Procedure Laterality Date  . ABDOMINAL HYSTERECTOMY  07-2005  . [place] Bunionectomy Left 09/18/2016   Left Foot  . BACK SURGERY    . KNEE SURGERY  2006   left; multiple  . microdiscetomy  2005  . SHOULDER SURGERY  07-22-09   right  . SKIN CANCER EXCISION     eyelid  . TOTAL KNEE ARTHROPLASTY  07/27/2011   Procedure: TOTAL KNEE ARTHROPLASTY;  Surgeon: Gearlean Alf;  Location: [facility];  Service: Orthopedics;  Laterality: Left;   Social History: Social History   Socioeconomic History  . Marital status: Married    Spouse name: Jenny Reichmann  . Number of children: 2  . Years of  education: Not on file  . Highest education level: Not on file  Occupational History  . Occupation: Programmer, multimedia: [employer]  Tobacco Use  . Smoking status: Never Smoker  . Smokeless tobacco: Never Used  Vaping Use  . Vaping Use: Never used  Substance and Sexual Activity  . Alcohol use: Yes    Comment: occasionally  . Drug use: No  . 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  Other Topics Concern  . Not on file  Social History Narrative  . Not on file   Social Determinants of Health   Financial Resource Strain: Not on file  Food Insecurity: Not on file  Transportation Needs: Not on file  Physical Activity: Not on file  Stress: Not on file  Social Connections: Not on file   Family History: Family History  Problem Relation Age of Onset  . Breast cancer Other   . Tongue cancer Mother   . Breast cancer Mother   . Anxiety disorder Mother   . Depression Mother   . Heart disease Father   . Hypertension Father   . Hyperlipidemia Father   . Stroke Paternal Grandmother   . Colon cancer Neg Hx    Allergies: Allergies  Allergen Reactions  . Succinylcholine Anaphylaxis  . Dilaudid [Hydromorphone Hcl] Nausea And Vomiting  .  Nsaids Other (See Comments)    Trigger colits   Medications: See med rec.  Review of Systems: See HPI for pertinent positives and negatives.   Objective:    General: Well Developed, well nourished, and in no acute distress.  Neuro: Alert and oriented x3.  HEENT: Normocephalic, atraumatic.  Skin: Warm and dry. Cardiac: Regular rate and rhythm, no murmurs rubs or gallops, no lower extremity edema.  Respiratory: Clear to auscultation bilaterally. Not using accessory muscles, speaking in full sentences.   Impression and Recommendations:    1. Pre-operative clearance In office EKG completed with rate- 65, normal sinus rhythm, no acute changes or  PVCs. Patient  has been found to have greater than 4 metabolic equivalents of cardiac capacity and is determine to be a low risk for a non-cardiac surgical procedure. Discussed medications to take the day of surgery, hold Gabapentin and Flexeril the morning of surgery but make sure to take antihypertensives and Vilazodone. No labs required per the Surgery Coordinator Marcelo Baldy) at [facility]. Clearance form completed and faxed along with EKG.   Return if symptoms worsen or fail to improve. ___________________________________________ Clearnce Sorrel, DNP, APRN, FNP-BC [facility] and [place]

## 2020-10-24 ENCOUNTER — Other Ambulatory Visit: Payer: Self-pay | Admitting: Family Medicine

## 2020-10-24 ENCOUNTER — Other Ambulatory Visit (HOSPITAL_COMMUNITY): Payer: Self-pay

## 2020-10-24 MED ORDER — HYDROCHLOROTHIAZIDE 25 MG PO TABS
ORAL_TABLET | Freq: Every day | ORAL | 0 refills | Status: DC
  Filled 2020-10-24: qty 90, 90d supply, fill #0

## 2020-10-24 MED FILL — Metoprolol Succinate Tab ER 24HR 25 MG (Tartrate Equiv): ORAL | 90 days supply | Qty: 90 | Fill #0 | Status: AC

## 2020-10-24 MED FILL — Estradiol TD Patch Twice Weekly 0.075 MG/24HR: TRANSDERMAL | 84 days supply | Qty: 24 | Fill #0 | Status: AC

## 2020-10-24 MED FILL — Losartan Potassium Tab 25 MG: ORAL | 90 days supply | Qty: 90 | Fill #0 | Status: AC

## 2020-10-25 ENCOUNTER — Other Ambulatory Visit (HOSPITAL_COMMUNITY): Payer: Self-pay

## 2020-10-28 ENCOUNTER — Other Ambulatory Visit (HOSPITAL_COMMUNITY): Payer: Self-pay

## 2020-10-28 MED ORDER — OXYCODONE-ACETAMINOPHEN 5-325 MG PO TABS
ORAL_TABLET | ORAL | 0 refills | Status: DC
  Filled 2020-10-28: qty 30, 5d supply, fill #0

## 2020-10-28 MED ORDER — TIZANIDINE HCL 4 MG PO TABS
ORAL_TABLET | ORAL | 0 refills | Status: DC
  Filled 2020-10-28: qty 40, 10d supply, fill #0

## 2020-10-29 DIAGNOSIS — G8918 Other acute postprocedural pain: Secondary | ICD-10-CM | POA: Diagnosis not present

## 2020-10-29 DIAGNOSIS — M65812 Other synovitis and tenosynovitis, left shoulder: Secondary | ICD-10-CM | POA: Diagnosis not present

## 2020-10-29 DIAGNOSIS — M19012 Primary osteoarthritis, left shoulder: Secondary | ICD-10-CM | POA: Diagnosis not present

## 2020-10-29 DIAGNOSIS — M7542 Impingement syndrome of left shoulder: Secondary | ICD-10-CM | POA: Diagnosis not present

## 2020-10-29 DIAGNOSIS — M7502 Adhesive capsulitis of left shoulder: Secondary | ICD-10-CM | POA: Diagnosis not present

## 2020-10-29 NOTE — Telephone Encounter (Signed)
Left message for pt to call, we are opening our 12/04/20 schedule and I held the 9:20 am spot for her if she wants it.

## 2020-10-30 ENCOUNTER — Ambulatory Visit (INDEPENDENT_AMBULATORY_CARE_PROVIDER_SITE_OTHER): Payer: 59 | Admitting: Physical Therapy

## 2020-10-30 ENCOUNTER — Other Ambulatory Visit: Payer: Self-pay

## 2020-10-30 DIAGNOSIS — R29898 Other symptoms and signs involving the musculoskeletal system: Secondary | ICD-10-CM

## 2020-10-30 DIAGNOSIS — G8929 Other chronic pain: Secondary | ICD-10-CM

## 2020-10-30 DIAGNOSIS — M25512 Pain in left shoulder: Secondary | ICD-10-CM

## 2020-10-30 DIAGNOSIS — M6281 Muscle weakness (generalized): Secondary | ICD-10-CM | POA: Diagnosis not present

## 2020-10-30 NOTE — Patient Instructions (Signed)
Access Code: [email or web address] URL: [email or web address] Date: 10/30/2020 Prepared by: [name]  Exercises Supine Shoulder Flexion Extension AAROM with Dowel - 1 x daily - 7 x weekly - 3 sets - 10 reps - 3 seocnds hold Supine Shoulder External Rotation with Dowel - 1 x daily - 7 x weekly - 3 sets - 10 reps - 3 seconds hold Supine Shoulder Abduction AAROM with Dowel - 1 x daily - 7 x weekly - 3 sets - 10 reps - 3 seconds hold

## 2020-10-30 NOTE — Therapy (Signed)
[address], [place], [address] Phone: [phone]   Fax:  [phone]  Physical Therapy Treatment and ReEvaluation  Patient Details  Name: Kathy Clark MRN: [id number] Date of Birth: Dec 10, 1960 Referring Provider (PT): chandler, justin   Encounter Date: 10/30/2020   PT End of Session - 10/30/20 1136    Visit Number 7    Number of Visits 19    Date for PT Re-Evaluation 12/11/20    PT Start Time 1103    PT Stop Time 1145    PT Time Calculation (min) 42 min    Activity Tolerance Patient tolerated treatment well    Behavior During Therapy [facility] for tasks assessed/performed           Past Medical History:  Diagnosis Date  . Anxiety   . Back pain   . 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 Depression   . Essential hypertension, benign   . Gastritis   . GERD (gastroesophageal reflux disease)   . Joint pain   . Lymphocytic colitis    microscopic  . Migraines   . Osteoarthritis   . Sciatica    right leg  . Swelling    feet, left foot    Past Surgical History:  Procedure Laterality Date  . ABDOMINAL HYSTERECTOMY  07-2005  . [place] Bunionectomy Left 09/18/2016   Left Foot  . BACK SURGERY    . KNEE SURGERY  2006   left; multiple  . microdiscetomy  2005  . SHOULDER SURGERY  07-22-09   right  . SKIN CANCER EXCISION     eyelid  . TOTAL KNEE ARTHROPLASTY  07/27/2011   Procedure: TOTAL KNEE ARTHROPLASTY;  Surgeon: Gearlean Alf;  Location: [facility];  Service: Orthopedics;  Laterality: Left;    There were no vitals filed for this visit.   Subjective Assessment - 10/30/20 1108    Subjective Pt is s/p Lt shoulder manipulation and debridement 10/29/20. MD recommends aggressive PROM especially into flexion and ER. Pt is allowed to perform AAROM.    Pertinent History Rt RTC repair with frozen shoulder, rheumatoid arthritis    Limitations Lifting;House hold activities     Patient Stated Goals improve motion and reduce pain    Pain Score 4     Pain Location Shoulder    Pain Orientation Left    Pain Descriptors / Indicators Sore    Pain Type Surgical pain    Pain Onset Yesterday    Aggravating Factors  movement    Pain Relieving Factors meds, ice              [facility] PT Assessment - 10/30/20 0001      Restrictions   Other Position/Activity Restrictions PROM and AAROM only until pt achieves full ROM      Prior Function   Level of Independence Independent      Observation/Other Assessments   Focus on Therapeutic Outcomes (FOTO)  47 functional status measure      PROM   Left Shoulder Flexion 163 Degrees    Left Shoulder ABduction 147 Degrees    Left Shoulder Internal Rotation 80 Degrees    Left Shoulder External Rotation 80 Degrees      Strength   Overall Strength Comments deferred due to procedure                         [facility] Adult PT Treatment/Exercise - 10/30/20  0001      Shoulder Exercises: Supine   Other Supine Exercises AAROM with cane abduction, ER, flexion all x 10      Modalities   Modalities Vasopneumatic      Vasopneumatic   Number Minutes Vasopneumatic  10 minutes    Vasopnuematic Location  Shoulder    Vasopneumatic Pressure Low    Vasopneumatic Temperature  34      Manual Therapy   Passive ROM Lt shoulder flexion, ER, abduction                  PT Education - 10/30/20 1135    Education Details updated HEP and PT POC and goals    Person(s) Educated Patient    Methods Explanation;Demonstration;Handout    Comprehension Verbalized understanding;Returned demonstration               PT Long Term Goals - 10/30/20 1142      PT LONG TERM GOAL #1   Title Pt will be independent with HEP    Time 6    Period Weeks    Status New    Target Date 12/11/20      PT LONG TERM GOAL #2   Title Pt will improve FOTO to >= 65 to demo functional improvement    Baseline 47 on 10/30/20    Time 6     Period Weeks    Status New    Target Date 12/11/20      PT LONG TERM GOAL #3   Title Pt will improve Lt UE strength to 4/5 to perform IADLs with decreased pain    Baseline not assessed due to precaution    Time 6    Period Weeks    Status New    Target Date 12/11/20      PT LONG TERM GOAL #4   Title Pt will improve Lt shoulder ROM to within 10 degrees of Rt shoulder to perform ADLs with decreased pain    Time 6    Period Weeks    Status New    Target Date 12/11/20                 Plan - 10/30/20 1136    Clinical Impression Statement Pt is s/p Lt shoulder manipulation and presents with improved PROM. Pt educated on AAROM per protocol. Pt will benefit from skilled PT to maintain ROM and improve functional shoulder strength and mobility.    Personal Factors and Comorbidities Comorbidity 2;Age;Time since onset of injury/illness/exacerbation    Examination-Activity Limitations Bathing;Carry;Reach Overhead;Hygiene/Grooming;Dressing    Examination-Participation Restrictions Community Activity;Church;Driving;Occupation    Stability/Clinical Decision Making Stable/Uncomplicated    Rehab Potential Good    PT Frequency 3x / week    PT Duration 6 weeks    PT Treatment/Interventions ADLs/Self Care Home Management;Cryotherapy;Moist Heat;Iontophoresis 4mg /ml Dexamethasone;Electrical Stimulation;Functional mobility training;Neuromuscular re-education;Therapeutic exercise;Therapeutic activities;Patient/family education;Manual techniques;Passive range of motion;Dry needling;Taping;Vasopneumatic Device    PT Next Visit Plan continue AAROM, PROM per protocol. Scapular retraction, elbow AROM    PT Home Exercise Plan Access Code: [email or web address]    Consulted and Agree with Plan of Care Patient           Patient will benefit from skilled therapeutic intervention in order to improve the following deficits and impairments:  Hypomobility,Pain,Impaired UE functional use,Decreased strength,Decreased  activity tolerance,Decreased range of motion  Visit Diagnosis: Chronic left shoulder pain - Plan: PT plan of care cert/re-cert  Muscle weakness (generalized) - Plan: PT plan of care cert/re-cert  Other  symptoms and signs involving the musculoskeletal system - Plan: PT plan of care cert/re-cert     Problem List Patient Active Problem List   Diagnosis Date Noted  . Indication present for endocarditis prophylaxis 05/07/2020  . PVC (premature ventricular contraction) 05/07/2020  . Major depressive disorder with single episode, in partial remission ([place]) 05/07/2020  . Bursitis of left shoulder 02/08/2020  . Annual physical exam 01/10/2020  . Sleeping difficulties 01/10/2020  . Rheumatoid arthritis of multiple sites with negative rheumatoid factor ([place]) 12/04/2019  . Polyarthralgia 11/02/2019  . Myofascial pain 11/02/2019  . Arthralgia of both hands 10/03/2019  . Metatarsalgia of left foot 01/05/2018  . Other hyperlipidemia 03/23/2017  . OSA (obstructive sleep apnea) 02/25/2017  . Class 1 obesity without serious comorbidity with body mass index (BMI) of 34.0 to 34.9 in adult 08/17/2016  . Elevated liver function tests 08/17/2016  . Vitamin D deficiency 08/03/2016  . Left tennis elbow 04/06/2016  . 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 Depression 08/23/2015  . Subacromial bursitis 04/23/2015  . Left carpal tunnel syndrome, post right carpal tunnel release 12/24/2014  . Trochanteric bursitis of right hip 04/21/2013  . Postop Mild Hyponatremia 07/30/2011  . OSTEOARTHRITIS, KNEE, LEFT 12/12/2009  . MIGRAINE WITH AURA 05/31/2008  . SKIN CANCER, HX OF 01/06/2008  . COLITIS, HX OF 01/06/2008  . ALLERGIC RHINITIS 05/20/2007  . HYPERTENSION, BENIGN ESSENTIAL 11/23/2006  . SCIATICA, CHRONIC 05/19/2006   [name], PT  Audree Schrecengost 10/30/2020, 11:50 AM  [place], [place], [address] Phone:  [phone]   Fax:  [phone]  Name: NAELA NODAL MRN: [id number] Date of Birth: 1960-08-02

## 2020-10-31 ENCOUNTER — Ambulatory Visit (INDEPENDENT_AMBULATORY_CARE_PROVIDER_SITE_OTHER): Payer: 59 | Admitting: Rehabilitative and Restorative Service Providers"

## 2020-10-31 DIAGNOSIS — M6281 Muscle weakness (generalized): Secondary | ICD-10-CM | POA: Diagnosis not present

## 2020-10-31 DIAGNOSIS — M25512 Pain in left shoulder: Secondary | ICD-10-CM

## 2020-10-31 DIAGNOSIS — G8929 Other chronic pain: Secondary | ICD-10-CM

## 2020-10-31 DIAGNOSIS — R29898 Other symptoms and signs involving the musculoskeletal system: Secondary | ICD-10-CM

## 2020-10-31 NOTE — Patient Instructions (Signed)
Access Code: [email or web address]: [email or web address]: 04/21/2022Prepared by: Telitha Plath HoltExercises  Supine Shoulder Flexion Extension AAROM with Dowel - 1 x daily - 7 x weekly - 3 sets - 10 reps - 3 seocnds hold  Supine Shoulder External Rotation with Dowel - 1 x daily - 7 x weekly - 3 sets - 10 reps - 3 seconds hold  Supine Shoulder Abduction AAROM with Dowel - 1 x daily - 7 x weekly - 3 sets - 10 reps - 3 seconds hold  Circular Shoulder Pendulum with Table Support - 3-4 x daily - 7 x weekly - 1 sets - 20-30 reps  Seated Shoulder Flexion Towel Slide at Table Top Full Range of Motion - 2 x daily - 7 x weekly - 1 sets - 5-10 reps - 10sec hold  Seated Shoulder Abduction Towel Slide at Table Top - 2 x daily - 7 x weekly - 1 sets - 5-10 reps - 10 sec hold  Seated Shoulder External Rotation AAROM with Cane and Hand in Neutral - 2 x daily - 7 x weekly - 1 sets - 5-10 reps - 5-10 sec hold

## 2020-10-31 NOTE — Therapy (Addendum)
[place], [place], [address] Phone: [phone]   Fax:  [phone]  Physical Therapy Treatment  Patient Details  Name: Kathy Clark MRN: [id number] Date of Birth: Nov 19, 1960 Referring Provider (PT): chandler, justin   Encounter Date: 10/31/2020   PT End of Session - 10/31/20 1106    Visit Number 8    Number of Visits 19    Date for PT Re-Evaluation 12/11/20    PT Start Time 1104    PT Stop Time 1145    PT Time Calculation (min) 41 min           Past Medical History:  Diagnosis Date  . Anxiety   . Back pain   . 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 Depression   . Essential hypertension, benign   . Gastritis   . GERD (gastroesophageal reflux disease)   . Joint pain   . Lymphocytic colitis    microscopic  . Migraines   . Osteoarthritis   . Sciatica    right leg  . Swelling    feet, left foot    Past Surgical History:  Procedure Laterality Date  . ABDOMINAL HYSTERECTOMY  07-2005  . [place] Bunionectomy Left 09/18/2016   Left Foot  . BACK SURGERY    . KNEE SURGERY  2006   left; multiple  . microdiscetomy  2005  . SHOULDER SURGERY  07-22-09   right  . SKIN CANCER EXCISION     eyelid  . TOTAL KNEE ARTHROPLASTY  07/27/2011   Procedure: TOTAL KNEE ARTHROPLASTY;  Surgeon: Gearlean Alf;  Location: [facility];  Service: Orthopedics;  Laterality: Left;    There were no vitals filed for this visit.   Subjective Assessment - 10/31/20 1548    Subjective Not much pain. Still taking pain meds. Working on exercises at home.    Currently in Pain? Yes    Pain Score 5     Pain Location Shoulder    Pain Orientation Left    Pain Descriptors / Indicators Sore    Pain Type Surgical pain    Pain Onset In the past 7 days    Pain Frequency Constant    Aggravating Factors  movement    Pain Relieving Factors meds; ice                              [facility] Adult PT Treatment/Exercise - 10/31/20 0001      Self-Care   Self-Care Other Self-Care Comments    Other Self-Care Comments  education re-positions for rest out of sling at times during the day      Shoulder Exercises: Supine   Other Supine Exercises AAROM with cane abduction, ER, flexion all x 5 reps    Other Supine Exercises scap squeeze/chest lift 5 sec x 5 reps      Shoulder Exercises: ROM/Strengthening   Pendulum 30 CW/30 CCW      Shoulder Exercises: Stretch   External Rotation Stretch 5 reps;10 seconds    Table Stretch - Flexion 2 reps;10 seconds    Table Stretch - Abduction 2 reps;10 seconds    Other Shoulder Stretches AAROM shoulder flexion supine assisting with opposite UE x 10 reps      Vasopneumatic   Number Minutes Vasopneumatic  10 minutes    Vasopnuematic Location  Shoulder    Vasopneumatic Pressure Low    Vasopneumatic Temperature  34      Manual Therapy   Manual therapy comments pt supine    Joint Mobilization joint circumduction    Soft tissue mobilization working through the upper trap; anterior shoulder; deltoid, medial scapular border around bandage    Passive ROM Lt shoulder flexion, ER, abduction                  PT Education - 10/31/20 1154    Education Details HEP supporting UE on pillow    Person(s) Educated Patient    Methods Explanation;Demonstration;Tactile cues;Verbal cues;Handout    Comprehension Verbalized understanding;Returned demonstration;Verbal cues required;Tactile cues required               PT Long Term Goals - 10/30/20 1142      PT LONG TERM GOAL #1   Title Pt will be independent with HEP    Time 6    Period Weeks    Status New    Target Date 12/11/20      PT LONG TERM GOAL #2   Title Pt will improve FOTO to >= 65 to demo functional improvement    Baseline 47 on 10/30/20    Time 6    Period Weeks    Status New    Target Date 12/11/20      PT LONG TERM GOAL #3   Title Pt will improve Lt UE strength to  4/5 to perform IADLs with decreased pain    Baseline not assessed due to precaution    Time 6    Period Weeks    Status New    Target Date 12/11/20      PT LONG TERM GOAL #4   Title Pt will improve Lt shoulder ROM to within 10 degrees of Rt shoulder to perform ADLs with decreased pain    Time 6    Period Weeks    Status New    Target Date 12/11/20                 Plan - 10/31/20 1150    Clinical Impression Statement Some pain today as block wears off. Patient reports that she is taking pain meds before PT; working on her exercises at home. Continued with P-AAROM exercises in clinic as well as PROM and manual work by PT. Tolerated all exercises/activities well.    Rehab Potential Good    PT Frequency 3x / week    PT Duration 6 weeks    PT Treatment/Interventions ADLs/Self Care Home Management;Cryotherapy;Moist Heat;Iontophoresis 4mg /ml Dexamethasone;Electrical Stimulation;Functional mobility training;Neuromuscular re-education;Therapeutic exercise;Therapeutic activities;Patient/family education;Manual techniques;Passive range of motion;Dry needling;Taping;Vasopneumatic Device    PT Next Visit Plan continue AAROM, PROM per protocol. Scapular retraction, elbow AROM    PT Home Exercise Plan [email or web address]    Consulted and Agree with Plan of Care Patient           Patient will benefit from skilled therapeutic intervention in order to improve the following deficits and impairments:     Visit Diagnosis: Chronic left shoulder pain  Muscle weakness (generalized)  Other symptoms and signs involving the musculoskeletal system     Problem List Patient Active Problem List   Diagnosis Date Noted  . Indication present for endocarditis prophylaxis 05/07/2020  . PVC (premature ventricular contraction) 05/07/2020  . Major depressive disorder with single episode, in partial remission ([place]) 05/07/2020  . Bursitis of left shoulder 02/08/2020  . Annual physical exam 01/10/2020  .  Sleeping difficulties 01/10/2020  . Rheumatoid arthritis of multiple sites with negative  rheumatoid factor ([place]) 12/04/2019  . Polyarthralgia 11/02/2019  . Myofascial pain 11/02/2019  . Arthralgia of both hands 10/03/2019  . Metatarsalgia of left foot 01/05/2018  . Other hyperlipidemia 03/23/2017  . OSA (obstructive sleep apnea) 02/25/2017  . Class 1 obesity without serious comorbidity with body mass index (BMI) of 34.0 to 34.9 in adult 08/17/2016  . Elevated liver function tests 08/17/2016  . Vitamin D deficiency 08/03/2016  . Left tennis elbow 04/06/2016  . 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 Depression 08/23/2015  . Subacromial bursitis 04/23/2015  . Left carpal tunnel syndrome, post right carpal tunnel release 12/24/2014  . Trochanteric bursitis of right hip 04/21/2013  . Postop Mild Hyponatremia 07/30/2011  . OSTEOARTHRITIS, KNEE, LEFT 12/12/2009  . MIGRAINE WITH AURA 05/31/2008  . SKIN CANCER, HX OF 01/06/2008  . COLITIS, HX OF 01/06/2008  . ALLERGIC RHINITIS 05/20/2007  . HYPERTENSION, BENIGN ESSENTIAL 11/23/2006  . SCIATICA, CHRONIC 05/19/2006    Aseneth Hack Nilda Simmer PT, MPH  10/31/2020, 3:51 PM  [facility] [place], [place], [address] Phone: [phone]   Fax:  [phone]  Name: FAATIMA TENCH MRN: [id number] Date of Birth: March 11, 1961

## 2020-10-31 NOTE — Telephone Encounter (Signed)
Follow up scheduled

## 2020-11-01 ENCOUNTER — Other Ambulatory Visit: Payer: Self-pay

## 2020-11-01 ENCOUNTER — Other Ambulatory Visit (HOSPITAL_COMMUNITY): Payer: Self-pay

## 2020-11-01 ENCOUNTER — Ambulatory Visit (INDEPENDENT_AMBULATORY_CARE_PROVIDER_SITE_OTHER): Payer: 59 | Admitting: Physical Therapy

## 2020-11-01 DIAGNOSIS — G8929 Other chronic pain: Secondary | ICD-10-CM

## 2020-11-01 DIAGNOSIS — M25512 Pain in left shoulder: Secondary | ICD-10-CM | POA: Diagnosis not present

## 2020-11-01 DIAGNOSIS — R29898 Other symptoms and signs involving the musculoskeletal system: Secondary | ICD-10-CM | POA: Diagnosis not present

## 2020-11-01 DIAGNOSIS — M6281 Muscle weakness (generalized): Secondary | ICD-10-CM | POA: Diagnosis not present

## 2020-11-01 NOTE — Therapy (Signed)
[place], [place], [address] Phone: [phone]   Fax:  [phone]  Physical Therapy Treatment  Patient Details  Name: Kathy Clark MRN: [id number] Date of Birth: Feb 12, 1961 Referring Provider (PT): Kathy, Clark   Encounter Date: 11/01/2020   PT End of Session - 11/01/20 1138    Visit Number 9    Number of Visits 19    Date for PT Re-Evaluation 12/11/20    PT Start Time 1100    PT Stop Time 1145    PT Time Calculation (min) 45 min    Activity Tolerance Patient tolerated treatment well    Behavior During Therapy [facility] for tasks assessed/performed           Past Medical History:  Diagnosis Date  . Anxiety   . Back pain   . 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 Depression   . Essential hypertension, benign   . Gastritis   . GERD (gastroesophageal reflux disease)   . Joint pain   . Lymphocytic colitis    microscopic  . Migraines   . Osteoarthritis   . Sciatica    right leg  . Swelling    feet, left foot    Past Surgical History:  Procedure Laterality Date  . ABDOMINAL HYSTERECTOMY  07-2005  . [place] Bunionectomy Left 09/18/2016   Left Foot  . BACK SURGERY    . KNEE SURGERY  2006   left; multiple  . microdiscetomy  2005  . SHOULDER SURGERY  07-22-09   right  . SKIN CANCER EXCISION     eyelid  . TOTAL KNEE ARTHROPLASTY  07/27/2011   Procedure: TOTAL KNEE ARTHROPLASTY;  Surgeon: Gearlean Alf;  Location: [facility];  Service: Orthopedics;  Laterality: Left;    There were no vitals filed for this visit.   Subjective Assessment - 11/01/20 1102    Subjective Pt states she still doesn't have too much pain    Patient Stated Goals improve motion and reduce pain    Currently in Pain? Yes    Pain Score 3     Pain Location Shoulder    Pain Orientation Left    Pain Descriptors / Indicators Sore    Pain Type Surgical pain                              [facility] Adult PT Treatment/Exercise - 11/01/20 0001      Shoulder Exercises: Supine   Other Supine Exercises AAROM with cane flexion, abduction, ER      Shoulder Exercises: Seated   Retraction 10 reps      Vasopneumatic   Number Minutes Vasopneumatic  10 minutes    Vasopnuematic Location  Shoulder    Vasopneumatic Pressure Low    Vasopneumatic Temperature  34      Manual Therapy   Soft tissue mobilization STM Lt upper trap, levator, deltoids    Passive ROM Lt shoulder flex, ER, abduction                       PT Long Term Goals - 10/30/20 1142      PT LONG TERM GOAL #1   Title Pt will be independent with HEP    Time 6    Period Weeks    Status New    Target Date 12/11/20      PT LONG TERM  GOAL #2   Title Pt will improve FOTO to >= 65 to demo functional improvement    Baseline 47 on 10/30/20    Time 6    Period Weeks    Status New    Target Date 12/11/20      PT LONG TERM GOAL #3   Title Pt will improve Lt UE strength to 4/5 to perform IADLs with decreased pain    Baseline not assessed due to precaution    Time 6    Period Weeks    Status New    Target Date 12/11/20      PT LONG TERM GOAL #4   Title Pt will improve Lt shoulder ROM to within 10 degrees of Rt shoulder to perform ADLs with decreased pain    Time 6    Period Weeks    Status New    Target Date 12/11/20                 Plan - 11/01/20 1138    Clinical Impression Statement Pt reports her pain is still minimal. Pt continued with AAROM and manual work. Good tolerance to all activities    PT Next Visit Plan continue PROM, AROM as tolerated    PT Home Exercise Plan [phone]           Patient will benefit from skilled therapeutic intervention in order to improve the following deficits and impairments:     Visit Diagnosis: Chronic left shoulder pain  Muscle weakness (generalized)  Other symptoms and signs involving the musculoskeletal  system     Problem List Patient Active Problem List   Diagnosis Date Noted  . Indication present for endocarditis prophylaxis 05/07/2020  . PVC (premature ventricular contraction) 05/07/2020  . Major depressive disorder with single episode, in partial remission ([place]) 05/07/2020  . Bursitis of left shoulder 02/08/2020  . Annual physical exam 01/10/2020  . Sleeping difficulties 01/10/2020  . Rheumatoid arthritis of multiple sites with negative rheumatoid factor ([place]) 12/04/2019  . Polyarthralgia 11/02/2019  . Myofascial pain 11/02/2019  . Arthralgia of both hands 10/03/2019  . Metatarsalgia of left foot 01/05/2018  . Other hyperlipidemia 03/23/2017  . OSA (obstructive sleep apnea) 02/25/2017  . Class 1 obesity without serious comorbidity with body mass index (BMI) of 34.0 to 34.9 in adult 08/17/2016  . Elevated liver function tests 08/17/2016  . Vitamin D deficiency 08/03/2016  . Left tennis elbow 04/06/2016  . 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 Depression 08/23/2015  . Subacromial bursitis 04/23/2015  . Left carpal tunnel syndrome, post right carpal tunnel release 12/24/2014  . Trochanteric bursitis of right hip 04/21/2013  . Postop Mild Hyponatremia 07/30/2011  . OSTEOARTHRITIS, KNEE, LEFT 12/12/2009  . MIGRAINE WITH AURA 05/31/2008  . SKIN CANCER, HX OF 01/06/2008  . COLITIS, HX OF 01/06/2008  . ALLERGIC RHINITIS 05/20/2007  . HYPERTENSION, BENIGN ESSENTIAL 11/23/2006  . SCIATICA, CHRONIC 05/19/2006   Kathy Clark, PT  Kathy Clark 11/01/2020, 11:41 AM  [facility] [place], [place], [address] Phone: [phone]   Fax:  [phone]  Name: Kathy Clark MRN: [id number] Date of Birth: 10/08/1960

## 2020-11-05 ENCOUNTER — Other Ambulatory Visit (HOSPITAL_COMMUNITY): Payer: Self-pay

## 2020-11-05 ENCOUNTER — Other Ambulatory Visit: Payer: Self-pay

## 2020-11-05 ENCOUNTER — Encounter: Payer: Self-pay | Admitting: Rehabilitative and Restorative Service Providers"

## 2020-11-05 ENCOUNTER — Ambulatory Visit (INDEPENDENT_AMBULATORY_CARE_PROVIDER_SITE_OTHER): Payer: 59 | Admitting: Rehabilitative and Restorative Service Providers"

## 2020-11-05 DIAGNOSIS — M6281 Muscle weakness (generalized): Secondary | ICD-10-CM | POA: Diagnosis not present

## 2020-11-05 DIAGNOSIS — G8929 Other chronic pain: Secondary | ICD-10-CM | POA: Diagnosis not present

## 2020-11-05 DIAGNOSIS — R29898 Other symptoms and signs involving the musculoskeletal system: Secondary | ICD-10-CM

## 2020-11-05 DIAGNOSIS — M25512 Pain in left shoulder: Secondary | ICD-10-CM | POA: Diagnosis not present

## 2020-11-05 NOTE — Therapy (Signed)
[place], [place], [address] Phone: [phone]   Fax:  [phone]  Physical Therapy Treatment  Patient Details  Name: Kathy Clark MRN: [id number] Date of Birth: [birth year] Referring Provider (PT): chandler, justin   Encounter Date: 11/05/2020   PT End of Session - 11/05/20 1020    Visit Number 10    Number of Visits 19    Date for PT Re-Evaluation 12/11/20    PT Start Time 1017    PT Stop Time 1105    PT Time Calculation (min) 48 min    Activity Tolerance Patient tolerated treatment well           Past Medical History:  Diagnosis Date  . Anxiety   . Back pain   . 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 Depression   . Essential hypertension, benign   . Gastritis   . GERD (gastroesophageal reflux disease)   . Joint pain   . Lymphocytic colitis    microscopic  . Migraines   . Osteoarthritis   . Sciatica    right leg  . Swelling    feet, left foot    Past Surgical History:  Procedure Laterality Date  . ABDOMINAL HYSTERECTOMY  07-2005  . [place] Bunionectomy Left 09/18/2016   Left Foot  . BACK SURGERY    . KNEE SURGERY  2006   left; multiple  . microdiscetomy  2005  . SHOULDER SURGERY  07-22-09   right  . SKIN CANCER EXCISION     eyelid  . TOTAL KNEE ARTHROPLASTY  07/27/2011   Procedure: TOTAL KNEE ARTHROPLASTY;  Surgeon: Gearlean Alf;  Location: [facility];  Service: Orthopedics;  Laterality: Left;    There were no vitals filed for this visit.   Subjective Assessment - 11/05/20 1020    Subjective Pain is 2/10 at rest but can go to 7-8/10 with exercises. Taking pain meds when exercising. Out of sling and doing well unless she forgets and tries to use arm.    Currently in Pain? Yes    Pain Score 3     Pain Location Shoulder    Pain Orientation Left    Pain Descriptors / Indicators Sore    Pain Type Chronic pain;Surgical pain    Pain Onset More  than a month ago    Pain Frequency Constant    Aggravating Factors  movement; use    Pain Relieving Factors meds; ice              [facility] PT Assessment - 11/05/20 0001      Assessment   Medical Diagnosis left frozen shoulder    Referring Provider (PT) chandler, justin    Onset Date/Surgical Date 03/13/20    Hand Dominance Right      PROM   Right/Left Shoulder --   assessed in supine   Left Shoulder Flexion 164 Degrees    Left Shoulder ABduction 156 Degrees   in scapular plane   Left Shoulder External Rotation 94 Degrees   in scapular plane                        [facility] Adult PT Treatment/Exercise - 11/05/20 0001      Shoulder Exercises: Standing   Other Standing Exercises scap squeeze with noodle 10 sec x 10 reps      Shoulder Exercises: Stretch   External Rotation Stretch 10 seconds   10  reps 10 sec hold elbow at side 90 deg flexion using cane   Wall Stretch - Flexion 5 reps;10 seconds   hand resting on counter top   Other Shoulder Stretches shoulder adduction hand behind back pulling across hips with cane 10 sec x 10; shoulder extension with cane 10 sec x 10      Vasopneumatic   Number Minutes Vasopneumatic  10 minutes    Vasopnuematic Location  Shoulder    Vasopneumatic Pressure Low    Vasopneumatic Temperature  34      Manual Therapy   Joint Mobilization joint circumduction    Soft tissue mobilization STM Lt pecs; upper trap, levator, deltoids    Passive ROM Lt shoulder flex; ER, abduction in scapular plane; extension and horizontal abduction                  PT Education - 11/05/20 1059    Education Details HEP    Person(s) Educated Patient    Methods Explanation;Demonstration;Tactile cues;Verbal cues;Handout    Comprehension Verbalized understanding;Returned demonstration;Verbal cues required;Tactile cues required               PT Long Term Goals - 10/30/20 1142      PT LONG TERM GOAL #1   Title Pt will be independent with HEP     Time 6    Period Weeks    Status New    Target Date 12/11/20      PT LONG TERM GOAL #2   Title Pt will improve FOTO to >= 65 to demo functional improvement    Baseline 47 on 10/30/20    Time 6    Period Weeks    Status New    Target Date 12/11/20      PT LONG TERM GOAL #3   Title Pt will improve Lt UE strength to 4/5 to perform IADLs with decreased pain    Baseline not assessed due to precaution    Time 6    Period Weeks    Status New    Target Date 12/11/20      PT LONG TERM GOAL #4   Title Pt will improve Lt shoulder ROM to within 10 degrees of Rt shoulder to perform ADLs with decreased pain    Time 6    Period Weeks    Status New    Target Date 12/11/20                 Plan - 11/05/20 1024    Clinical Impression Statement Continues to progress adding PROM and AAROM exercises. Substituted counter step back flexion for shoulder flexion supine with cane to avoid pain reported with supine cane flexion. Gradually increasing ROM. Progressing toward goals of therapy.    Rehab Potential Good    PT Frequency 3x / week    PT Duration 6 weeks    PT Treatment/Interventions ADLs/Self Care Home Management;Cryotherapy;Moist Heat;Iontophoresis 4mg /ml Dexamethasone;Electrical Stimulation;Functional mobility training;Neuromuscular re-education;Therapeutic exercise;Therapeutic activities;Patient/family education;Manual techniques;Passive range of motion;Dry needling;Taping;Vasopneumatic Device    PT Next Visit Plan continue PROM, AROM as tolerated    PT Home Exercise Plan [email or web address]    Consulted and Agree with Plan of Care Patient           Patient will benefit from skilled therapeutic intervention in order to improve the following deficits and impairments:     Visit Diagnosis: Chronic left shoulder pain  Muscle weakness (generalized)  Other symptoms and signs involving the musculoskeletal system     Problem  List Patient Active Problem List   Diagnosis Date Noted  .  Indication present for endocarditis prophylaxis 05/07/2020  . PVC (premature ventricular contraction) 05/07/2020  . Major depressive disorder with single episode, in partial remission ([place]) 05/07/2020  . Bursitis of left shoulder 02/08/2020  . Annual physical exam 01/10/2020  . Sleeping difficulties 01/10/2020  . Rheumatoid arthritis of multiple sites with negative rheumatoid factor ([place]) 12/04/2019  . Polyarthralgia 11/02/2019  . Myofascial pain 11/02/2019  . Arthralgia of both hands 10/03/2019  . Metatarsalgia of left foot 01/05/2018  . Other hyperlipidemia 03/23/2017  . OSA (obstructive sleep apnea) 02/25/2017  . Class 1 obesity without serious comorbidity with body mass index (BMI) of 34.0 to 34.9 in adult 08/17/2016  . Elevated liver function tests 08/17/2016  . Vitamin D deficiency 08/03/2016  . Left tennis elbow 04/06/2016  . 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 Depression 08/23/2015  . Subacromial bursitis 04/23/2015  . Left carpal tunnel syndrome, post right carpal tunnel release 12/24/2014  . Trochanteric bursitis of right hip 04/21/2013  . Postop Mild Hyponatremia 07/30/2011  . OSTEOARTHRITIS, KNEE, LEFT 12/12/2009  . MIGRAINE WITH AURA 05/31/2008  . SKIN CANCER, HX OF 01/06/2008  . COLITIS, HX OF 01/06/2008  . ALLERGIC RHINITIS 05/20/2007  . HYPERTENSION, BENIGN ESSENTIAL 11/23/2006  . SCIATICA, CHRONIC 05/19/2006    Meka Lewan Nilda Simmer PT, MPH  11/05/2020, 11:01 AM  [facility] [place], [place], [address] Phone: [phone]   Fax:  [phone]  Name: GLEN BLATCHLEY MRN: [id number] Date of Birth: 10-27-1960

## 2020-11-05 NOTE — Patient Instructions (Signed)
Access Code: [email or web address]: [email or web address]: 04/26/2022Prepared by: Ozzy Bohlken HoltExercises  Supine Shoulder Flexion Extension AAROM with [place] - 1 x daily - 7 x weekly - 3 sets - 10 reps - 3 seocnds hold  Supine Shoulder External Rotation with Dowel - 1 x daily - 7 x weekly - 3 sets - 10 reps - 3 seconds hold  Supine Shoulder Abduction AAROM with [place]el - 1 x daily - 7 x weekly - 3 sets - 10 reps - 3 seconds hold  Circular Shoulder Pendulum with Table Support - 3-4 x daily - 7 x weekly - 1 sets - 20-30 reps  Seated Shoulder Flexion Towel Slide at Table Top Full Range of Motion - 2 x daily - 7 x weekly - 1 sets - 5-10 reps - 10sec hold  Seated Shoulder Abduction Towel Slide at Table Top - 2 x daily - 7 x weekly - 1 sets - 5-10 reps - 10 sec hold  Seated Shoulder External Rotation AAROM with Cane and Hand in Neutral - 2 x daily - 7 x weekly - 1 sets - 5-10 reps - 5-10 sec hold  Standing Shoulder Extension with Dowel - 2 x daily - 7 x weekly - 1 sets - 5-10 reps - 5-10 sec hold  Standing Shoulder and Trunk Flexion at Table - 2 x daily - 7 x weekly - 1 sets - 3 reps - 30 sec hold  Standing Shoulder External Rotation AAROM with Dowel - 2 x daily - 7 x weekly - 1 sets - 5-10 reps - 10 sec hold

## 2020-11-07 ENCOUNTER — Other Ambulatory Visit: Payer: Self-pay

## 2020-11-07 ENCOUNTER — Ambulatory Visit (INDEPENDENT_AMBULATORY_CARE_PROVIDER_SITE_OTHER): Payer: 59 | Admitting: Rehabilitative and Restorative Service Providers"

## 2020-11-07 ENCOUNTER — Other Ambulatory Visit (HOSPITAL_COMMUNITY): Payer: Self-pay

## 2020-11-07 ENCOUNTER — Encounter: Payer: Self-pay | Admitting: Rehabilitative and Restorative Service Providers"

## 2020-11-07 DIAGNOSIS — G8929 Other chronic pain: Secondary | ICD-10-CM

## 2020-11-07 DIAGNOSIS — M25512 Pain in left shoulder: Secondary | ICD-10-CM

## 2020-11-07 DIAGNOSIS — R29898 Other symptoms and signs involving the musculoskeletal system: Secondary | ICD-10-CM

## 2020-11-07 DIAGNOSIS — Z9889 Other specified postprocedural states: Secondary | ICD-10-CM | POA: Diagnosis not present

## 2020-11-07 DIAGNOSIS — M6281 Muscle weakness (generalized): Secondary | ICD-10-CM

## 2020-11-07 NOTE — Therapy (Signed)
[place], [place], [address] Phone: [phone]   Fax:  [phone]  Physical Therapy Treatment  Patient Details  Name: Kathy Clark MRN: [id number] Date of Birth: 09-Nov-1960 Referring Provider (PT): chandler, justin   Encounter Date: 11/07/2020   PT End of Session - 11/07/20 1403    Visit Number 11    Number of Visits 19    Date for PT Re-Evaluation 12/11/20    PT Start Time 1400    PT Stop Time 1448    PT Time Calculation (min) 48 min    Activity Tolerance Patient tolerated treatment well           Past Medical History:  Diagnosis Date  . Anxiety   . Back pain   . 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 Depression   . Essential hypertension, benign   . Gastritis   . GERD (gastroesophageal reflux disease)   . Joint pain   . Lymphocytic colitis    microscopic  . Migraines   . Osteoarthritis   . Sciatica    right leg  . Swelling    feet, left foot    Past Surgical History:  Procedure Laterality Date  . ABDOMINAL HYSTERECTOMY  07-2005  . [place] Bunionectomy Left 09/18/2016   Left Foot  . BACK SURGERY    . KNEE SURGERY  2006   left; multiple  . microdiscetomy  2005  . SHOULDER SURGERY  07-22-09   right  . SKIN CANCER EXCISION     eyelid  . TOTAL KNEE ARTHROPLASTY  07/27/2011   Procedure: TOTAL KNEE ARTHROPLASTY;  Surgeon: Gearlean Alf;  Location: [facility];  Service: Orthopedics;  Laterality: Left;    There were no vitals filed for this visit.   Subjective Assessment - 11/07/20 1403    Subjective PA was pleased with progress. Patient is now allowed to use Lt UE for activities as tolerated. Unloaded the dishwasher yesterday and that was exciting. Drove today and her neck is a little sore.    Currently in Pain? Yes    Pain Score 2     Pain Location Shoulder    Pain Orientation Left    Pain Descriptors / Indicators Sore                              [facility] Adult PT Treatment/Exercise - 11/07/20 0001      Shoulder Exercises: Supine   Other Supine Exercises scap squeeze 10 sec x 8 reps      Shoulder Exercises: Standing   Other Standing Exercises scap squeeze with noodle 10 sec x 10 reps; L's x 10; W's x 10      Shoulder Exercises: Pulleys   Flexion --   10 sec hold x 10 reps   Scaption --   10 sec hold x 10 reps     Shoulder Exercises: Therapy Ball   Other Therapy Ball Exercises 8 inch ball between hands for wt bearing exerciess - elbow flexion/extension; shoulder press out/pull back; wood chop diagonals toward both sides 5 reps each; wt bearing Lt UE on ball for rolling ball fwd/back ball at side      Shoulder Exercises: ROM/Strengthening   "W" Arms x 10 reps standing      Shoulder Exercises: Stretch   Internal Rotation Stretch 3 reps   10 sec hold strap behind back   Wall  Stretch - Flexion 5 reps;10 seconds   hand resting on counter top   Other Shoulder Stretches shoulder adduction hand behind back pulling across hips with cane 10 sec x 10; shoulder extension with cane 10 sec x 10    Other Shoulder Stretches prolonged snow angel ~ 2 min UE's at ~ 70 deg abd      Vasopneumatic   Number Minutes Vasopneumatic  10 minutes    Vasopnuematic Location  Shoulder    Vasopneumatic Pressure Low    Vasopneumatic Temperature  34      Manual Therapy   Manual therapy comments pt supine    Joint Mobilization joint circumduction    Soft tissue mobilization STM Lt pecs; upper trap, levator, deltoids    Passive ROM Lt shoulder flex; ER, IR, abduction in scapular plane; extension and horizontal abduction                  PT Education - 11/07/20 1420    Education Details HEP    Person(s) Educated Patient    Methods Explanation;Demonstration;Tactile cues;Verbal cues;Handout    Comprehension Verbalized understanding;Returned demonstration;Verbal cues required;Tactile cues required                PT Long Term Goals - 10/30/20 1142      PT LONG TERM GOAL #1   Title Pt will be independent with HEP    Time 6    Period Weeks    Status New    Target Date 12/11/20      PT LONG TERM GOAL #2   Title Pt will improve FOTO to >= 65 to demo functional improvement    Baseline 47 on 10/30/20    Time 6    Period Weeks    Status New    Target Date 12/11/20      PT LONG TERM GOAL #3   Title Pt will improve Lt UE strength to 4/5 to perform IADLs with decreased pain    Baseline not assessed due to precaution    Time 6    Period Weeks    Status New    Target Date 12/11/20      PT LONG TERM GOAL #4   Title Pt will improve Lt shoulder ROM to within 10 degrees of Rt shoulder to perform ADLs with decreased pain    Time 6    Period Weeks    Status New    Target Date 12/11/20                 Plan - 11/07/20 1406    Clinical Impression Statement PA very pleased with patient's progress and will allow patient to use Lt UE for activities as tolerated - can play keyboard and use Lt UE for functional activities. Added active to active assistive exercises and scapular retraction to work on posterior shoulder girdle activation. Needs to gradually progress with strengthening and stabilization. Continue with vaso post exercise to help prevent post exercise delayed muscle onset soreness    Rehab Potential Good    PT Frequency 3x / week    PT Duration 6 weeks    PT Treatment/Interventions ADLs/Self Care Home Management;Cryotherapy;Moist Heat;Iontophoresis 4mg /ml Dexamethasone;Electrical Stimulation;Functional mobility training;Neuromuscular re-education;Therapeutic exercise;Therapeutic activities;Patient/family education;Manual techniques;Passive range of motion;Dry needling;Taping;Vasopneumatic Device    PT Next Visit Plan continue PROM, AROM as tolerated; progress with active exerise gradually progressing to strengthening as tolerated - needs work for posterior shoulder girdle  strengthening    PT [place]  Consulted and Agree with Plan of Care Patient           Patient will benefit from skilled therapeutic intervention in order to improve the following deficits and impairments:     Visit Diagnosis: Chronic left shoulder pain  Muscle weakness (generalized)  Other symptoms and signs involving the musculoskeletal system     Problem List Patient Active Problem List   Diagnosis Date Noted  . Indication present for endocarditis prophylaxis 05/07/2020  . PVC (premature ventricular contraction) 05/07/2020  . Major depressive disorder with single episode, in partial remission ([place]) 05/07/2020  . Bursitis of left shoulder 02/08/2020  . Annual physical exam 01/10/2020  . Sleeping difficulties 01/10/2020  . Rheumatoid arthritis of multiple sites with negative rheumatoid factor ([place]) 12/04/2019  . Polyarthralgia 11/02/2019  . Myofascial pain 11/02/2019  . Arthralgia of both hands 10/03/2019  . Metatarsalgia of left foot 01/05/2018  . Other hyperlipidemia 03/23/2017  . OSA (obstructive sleep apnea) 02/25/2017  . Class 1 obesity without serious comorbidity with body mass index (BMI) of 34.0 to 34.9 in adult 08/17/2016  . Elevated liver function tests 08/17/2016  . Vitamin D deficiency 08/03/2016  . Left tennis elbow 04/06/2016  . 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 Depression 08/23/2015  . Subacromial bursitis 04/23/2015  . Left carpal tunnel syndrome, post right carpal tunnel release 12/24/2014  . Trochanteric bursitis of right hip 04/21/2013  . Postop Mild Hyponatremia 07/30/2011  . OSTEOARTHRITIS, KNEE, LEFT 12/12/2009  . MIGRAINE WITH AURA 05/31/2008  . SKIN CANCER, HX OF 01/06/2008  . COLITIS, HX OF 01/06/2008  . ALLERGIC RHINITIS 05/20/2007  . HYPERTENSION, BENIGN ESSENTIAL 11/23/2006  . SCIATICA, CHRONIC 05/19/2006    Aurther Harlin Nilda Simmer PT, MPH  11/07/2020, 3:00 PM  [facility] [place], [place], [address] Phone: [phone]   Fax:  [phone]  Name: Kathy Clark MRN: [id number] Date of Birth: 08-09-1960

## 2020-11-07 NOTE — Patient Instructions (Signed)
Access Code: [email or web address]: [email or web address]: 04/28/2022Prepared by: Jaselyn Nahm HoltExercises  Circular Shoulder Pendulum with Table Support - 3-4 x daily - 7 x weekly - 1 sets - 20-30 reps  Standing Shoulder Extension with Dowel - 2 x daily - 7 x weekly - 1 sets - 5-10 reps - 5-10 sec hold  Standing Shoulder and Trunk Flexion at Table - 2 x daily - 7 x weekly - 1 sets - 3 reps - 10 sec hold  Seated Shoulder Flexion AAROM with Pulley Behind - 2 x daily - 7 x weekly - 1 sets - 10 reps - 10 sec hold  Seated Shoulder Scaption AAROM with Pulley at Side - 2 x daily - 7 x weekly - 1 sets - 10 reps - 10sec hold  Shoulder External Rotation and Scapular Retraction - 2 x daily - 7 x weekly - 1 sets - 10 reps - 5 sec hold  Shoulder External Rotation in 45 Degrees Abduction - 2 x daily - 7 x weekly - 1-2 sets - 10 reps - 3 sec hold  Standing Shoulder Internal Rotation Stretch with Towel - 2 x daily - 7 x weekly - 1 sets - 3-5 reps - 10-15 hold

## 2020-11-08 ENCOUNTER — Other Ambulatory Visit (HOSPITAL_COMMUNITY): Payer: Self-pay

## 2020-11-12 ENCOUNTER — Encounter: Payer: 59 | Admitting: Physical Therapy

## 2020-11-14 ENCOUNTER — Ambulatory Visit (INDEPENDENT_AMBULATORY_CARE_PROVIDER_SITE_OTHER): Payer: 59 | Admitting: Rehabilitative and Restorative Service Providers"

## 2020-11-14 ENCOUNTER — Encounter: Payer: Self-pay | Admitting: Rehabilitative and Restorative Service Providers"

## 2020-11-14 ENCOUNTER — Other Ambulatory Visit: Payer: Self-pay

## 2020-11-14 DIAGNOSIS — G8929 Other chronic pain: Secondary | ICD-10-CM | POA: Diagnosis not present

## 2020-11-14 DIAGNOSIS — M6281 Muscle weakness (generalized): Secondary | ICD-10-CM

## 2020-11-14 DIAGNOSIS — M25512 Pain in left shoulder: Secondary | ICD-10-CM

## 2020-11-14 DIAGNOSIS — R29898 Other symptoms and signs involving the musculoskeletal system: Secondary | ICD-10-CM | POA: Diagnosis not present

## 2020-11-14 NOTE — Patient Instructions (Signed)
Access Code: [email or web address]: [email or web address]: 05/05/2022Prepared by: Annaleigha Woo HoltExercises  Circular Shoulder Pendulum with Table Support - 3-4 x daily - 7 x weekly - 1 sets - 20-30 reps  Standing Shoulder Extension with Dowel - 2 x daily - 7 x weekly - 1 sets - 5-10 reps - 5-10 sec hold  Standing Shoulder and Trunk Flexion at Table - 2 x daily - 7 x weekly - 1 sets - 3 reps - 10 sec hold  Seated Shoulder Flexion AAROM with Pulley Behind - 2 x daily - 7 x weekly - 1 sets - 10 reps - 10 sec hold  Seated Shoulder Scaption AAROM with Pulley at Side - 2 x daily - 7 x weekly - 1 sets - 10 reps - 10sec hold  Shoulder External Rotation and Scapular Retraction - 2 x daily - 7 x weekly - 1 sets - 10 reps - 5 sec hold  Shoulder External Rotation in 45 Degrees Abduction - 2 x daily - 7 x weekly - 1-2 sets - 10 reps - 3 sec hold  Standing Shoulder Internal Rotation Stretch with Towel - 2 x daily - 7 x weekly - 1 sets - 3-5 reps - 10-15 hold  Seated Shoulder W - 2 x daily - 7 x weekly - 1 sets - 10 reps - 3 sec hold  Standing Shoulder External Rotation with Resistance - 2 x daily - 7 x weekly - 1-3 sets - 10 reps - 2-3 sec hold  Standing Bilateral Low Shoulder Row with Anchored Resistance - 2 x daily - 7 x weekly - 1-3 sets - 10 reps - 2-3 sec hold  Shoulder Extension with Resistance - 2 x daily - 7 x weekly - 1-3 sets - 10 reps - 2-3 sec hold  Bilateral Scapular Depression with Anchored Resistance - Straight Arm - 2 x daily - 7 x weekly - 2 sets - 10 reps - 3-5 sec hold  Corner Pec Major Stretch - 2 x daily - 7 x weekly - 1 sets - 3 reps - 30 sec hold  Seated Upper Trap Stretch - 2 x daily - 7 x weekly - 1 sets - 3 reps - 10 sec hold

## 2020-11-14 NOTE — Therapy (Signed)
[place], [place], [address] Phone: [phone]   Fax:  [phone]  Physical Therapy Treatment  Patient Details  Name: Kathy Clark MRN: [id number] Date of Birth: [birth year] Referring Provider (PT): chandler, justin   Encounter Date: 11/14/2020   PT End of Session - 11/14/20 1148    Visit Number 12    Number of Visits 19    Date for PT Re-Evaluation 12/11/20    PT Start Time 1147    PT Stop Time 2355    PT Time Calculation (min) 48 min    Activity Tolerance Patient tolerated treatment well           Past Medical History:  Diagnosis Date  . Anxiety   . Back pain   . 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 Depression   . Essential hypertension, benign   . Gastritis   . GERD (gastroesophageal reflux disease)   . Joint pain   . Lymphocytic colitis    microscopic  . Migraines   . Osteoarthritis   . Sciatica    right leg  . Swelling    feet, left foot    Past Surgical History:  Procedure Laterality Date  . ABDOMINAL HYSTERECTOMY  07-2005  . [place] Bunionectomy Left 09/18/2016   Left Foot  . BACK SURGERY    . KNEE SURGERY  2006   left; multiple  . microdiscetomy  2005  . SHOULDER SURGERY  07-22-09   right  . SKIN CANCER EXCISION     eyelid  . TOTAL KNEE ARTHROPLASTY  07/27/2011   Procedure: TOTAL KNEE ARTHROPLASTY;  Surgeon: Gearlean Alf;  Location: [facility];  Service: Orthopedics;  Laterality: Left;    There were no vitals filed for this visit.   Subjective Assessment - 11/14/20 1148    Subjective Patient reports that she is doing well - using Lt UE for more functional activities. Went back to work yesterday and did OK. Still has some pain.    Currently in Pain? Yes    Pain Score 2     Pain Location Shoulder    Pain Orientation Left    Pain Descriptors / Indicators Sore;Aching    Pain Type Chronic pain;Surgical pain                              [facility] Adult PT Treatment/Exercise - 11/14/20 0001      Shoulder Exercises: Standing   Extension Strengthening;Both;10 reps;Theraband    Theraband Level (Shoulder Extension) Level 3 (Green)    Row Strengthening;Both;10 reps;Theraband    Theraband Level (Shoulder Row) Level 3 (Green)    Retraction Strengthening;Both;Theraband;15 reps    Theraband Level (Shoulder Retraction) Level 2 (Red)    Other Standing Exercises scap squeeze with noodle 10 sec x 10 reps; L's x 10; W's x 10      Shoulder Exercises: Pulleys   Flexion --   10 sec hold x 10 reps   Scaption --   10 sec hold x 10 reps     Shoulder Exercises: ROM/Strengthening   "W" Arms x 10 reps standing      Shoulder Exercises: Stretch   Corner Stretch 2 reps;20 seconds;30 seconds   60 deg shd abduction   Wall Stretch - Flexion 5 reps;10 seconds   hand resting on counter top     Vasopneumatic   Number Minutes Vasopneumatic  10  minutes    Vasopnuematic Location  Shoulder    Vasopneumatic Pressure Low    Vasopneumatic Temperature  34      Manual Therapy   Manual therapy comments pt supine    Joint Mobilization joint circumduction    Soft tissue mobilization STM Lt pecs; upper trap, levator, deltoids    Passive ROM Lt shoulder flex; ER, IR, abduction in scapular plane; extension and horizontal abduction    Manual Traction through long arm at side 10-20 sec pull x 3-4 during treatment                  PT Education - 11/14/20 1236    Education Details HEP    Person(s) Educated Patient    Methods Explanation;Demonstration;Tactile cues;Verbal cues;Handout    Comprehension Verbalized understanding;Returned demonstration;Verbal cues required;Tactile cues required               PT Long Term Goals - 10/30/20 1142      PT LONG TERM GOAL #1   Title Pt will be independent with HEP    Time 6    Period Weeks    Status New    Target Date 12/11/20      PT LONG TERM GOAL #2   Title  Pt will improve FOTO to >= 65 to demo functional improvement    Baseline 47 on 10/30/20    Time 6    Period Weeks    Status New    Target Date 12/11/20      PT LONG TERM GOAL #3   Title Pt will improve Lt UE strength to 4/5 to perform IADLs with decreased pain    Baseline not assessed due to precaution    Time 6    Period Weeks    Status New    Target Date 12/11/20      PT LONG TERM GOAL #4   Title Pt will improve Lt shoulder ROM to within 10 degrees of Rt shoulder to perform ADLs with decreased pain    Time 6    Period Weeks    Status New    Target Date 12/11/20                 Plan - 11/14/20 1159    Clinical Impression Statement Some discomfort and pain in the Lt shoulder when she moves in the wrong direction; overuses the Lt UE or sleeps on the Lt side. Added strengthening exercises today without difficulty. Note continued muscular tightness through the pecs; upper trap; anterior deltoid; biceps.    Rehab Potential Good    PT Frequency 3x / week    PT Duration 6 weeks    PT Treatment/Interventions ADLs/Self Care Home Management;Cryotherapy;Moist Heat;Iontophoresis 4mg /ml Dexamethasone;Electrical Stimulation;Functional mobility training;Neuromuscular re-education;Therapeutic exercise;Therapeutic activities;Patient/family education;Manual techniques;Passive range of motion;Dry needling;Taping;Vasopneumatic Device    PT Next Visit Plan continue PROM, AROM  and active exerise gradually progressing to strengthening as tolerated - needs work for posterior shoulder girdle strengthening    PT Home Exercise Plan [email or web address]    Consulted and Agree with Plan of Care Patient           Patient will benefit from skilled therapeutic intervention in order to improve the following deficits and impairments:     Visit Diagnosis: Chronic left shoulder pain  Muscle weakness (generalized)  Other symptoms and signs involving the musculoskeletal system     Problem List Patient  Active Problem List   Diagnosis Date Noted  . Indication present for endocarditis prophylaxis  05/07/2020  . PVC (premature ventricular contraction) 05/07/2020  . Major depressive disorder with single episode, in partial remission ([place]) 05/07/2020  . Bursitis of left shoulder 02/08/2020  . Annual physical exam 01/10/2020  . Sleeping difficulties 01/10/2020  . Rheumatoid arthritis of multiple sites with negative rheumatoid factor ([place]) 12/04/2019  . Polyarthralgia 11/02/2019  . Myofascial pain 11/02/2019  . Arthralgia of both hands 10/03/2019  . Metatarsalgia of left foot 01/05/2018  . Other hyperlipidemia 03/23/2017  . OSA (obstructive sleep apnea) 02/25/2017  . Class 1 obesity without serious comorbidity with body mass index (BMI) of 34.0 to 34.9 in adult 08/17/2016  . Elevated liver function tests 08/17/2016  . Vitamin D deficiency 08/03/2016  . Left tennis elbow 04/06/2016  . 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 Depression 08/23/2015  . Subacromial bursitis 04/23/2015  . Left carpal tunnel syndrome, post right carpal tunnel release 12/24/2014  . Trochanteric bursitis of right hip 04/21/2013  . Postop Mild Hyponatremia 07/30/2011  . OSTEOARTHRITIS, KNEE, LEFT 12/12/2009  . MIGRAINE WITH AURA 05/31/2008  . SKIN CANCER, HX OF 01/06/2008  . COLITIS, HX OF 01/06/2008  . ALLERGIC RHINITIS 05/20/2007  . HYPERTENSION, BENIGN ESSENTIAL 11/23/2006  . SCIATICA, CHRONIC 05/19/2006    Tyron Manetta Nilda Simmer PT, MPH  11/14/2020, 12:46 PM  [facility], [place], [address] Phone: [phone]   Fax:  [phone]  Name: Kathy Clark MRN: [id number] Date of Birth: 1961-01-02

## 2020-11-19 ENCOUNTER — Encounter: Payer: Self-pay | Admitting: Rehabilitative and Restorative Service Providers"

## 2020-11-21 ENCOUNTER — Other Ambulatory Visit: Payer: Self-pay

## 2020-11-21 ENCOUNTER — Encounter: Payer: Self-pay | Admitting: Rehabilitative and Restorative Service Providers"

## 2020-11-21 ENCOUNTER — Ambulatory Visit (INDEPENDENT_AMBULATORY_CARE_PROVIDER_SITE_OTHER): Payer: 59 | Admitting: Rehabilitative and Restorative Service Providers"

## 2020-11-21 DIAGNOSIS — M25512 Pain in left shoulder: Secondary | ICD-10-CM | POA: Diagnosis not present

## 2020-11-21 DIAGNOSIS — G8929 Other chronic pain: Secondary | ICD-10-CM

## 2020-11-21 DIAGNOSIS — M6281 Muscle weakness (generalized): Secondary | ICD-10-CM | POA: Diagnosis not present

## 2020-11-21 DIAGNOSIS — R29898 Other symptoms and signs involving the musculoskeletal system: Secondary | ICD-10-CM | POA: Diagnosis not present

## 2020-11-21 NOTE — Patient Instructions (Signed)

## 2020-11-21 NOTE — Therapy (Signed)
[place], [place], [address] Phone: [phone]   Fax:  [phone]  Physical Therapy Treatment  Patient Details  Name: Kathy Clark MRN: [id number] Date of Birth: [birth year] Referring Provider (PT): chandler, justin   Encounter Date: 11/21/2020   PT End of Session - 11/21/20 1106    Visit Number 13    Number of Visits 19    Date for PT Re-Evaluation 12/11/20    PT Start Time 1105    PT Stop Time 1154    PT Time Calculation (min) 49 min    Activity Tolerance Patient tolerated treatment well           Past Medical History:  Diagnosis Date  . Anxiety   . Back pain   . 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 Depression   . Essential hypertension, benign   . Gastritis   . GERD (gastroesophageal reflux disease)   . Joint pain   . Lymphocytic colitis    microscopic  . Migraines   . Osteoarthritis   . Sciatica    right leg  . Swelling    feet, left foot    Past Surgical History:  Procedure Laterality Date  . ABDOMINAL HYSTERECTOMY  07-2005  . [place] Bunionectomy Left 09/18/2016   Left Foot  . BACK SURGERY    . KNEE SURGERY  2006   left; multiple  . microdiscetomy  2005  . SHOULDER SURGERY  07-22-09   right  . SKIN CANCER EXCISION     eyelid  . TOTAL KNEE ARTHROPLASTY  07/27/2011   Procedure: TOTAL KNEE ARTHROPLASTY;  Surgeon: Gearlean Alf;  Location: [facility];  Service: Orthopedics;  Laterality: Left;    There were no vitals filed for this visit.   Subjective Assessment - 11/21/20 1107    Subjective Patient reports that she is having more pain and popping in the Lt shoulder which started Thursday night. She did use Lt UE more playing keyboard and on computer returning to work, driving, she was out of town this past weekend and may have used Lt UE for more lifting, opening door, etc. She is having difficulty sleeping.    Currently in Pain? Yes    Pain  Score 6     Pain Location Shoulder    Pain Orientation Left    Pain Descriptors / Indicators Sore;Aching    Pain Type Chronic pain;Surgical pain                             [facility] Adult PT Treatment/Exercise - 11/21/20 0001      Shoulder Exercises: Seated   Other Seated Exercises shoulder rolls backwards x 10 several sets    Other Seated Exercises scap squeeze with chest lift 10 sec x 10      Shoulder Exercises: Stretch   Other Shoulder Stretches prolonged snow angel ~ 2 min UE's at ~ 70 to 80 deg abd      Vasopneumatic   Number Minutes Vasopneumatic  10 minutes    Vasopnuematic Location  Shoulder    Vasopneumatic Pressure Low    Vasopneumatic Temperature  34      Manual Therapy   Manual therapy comments pt supine    Joint Mobilization joint circumduction    Soft tissue mobilization STM Lt pecs; upper trap, levator, deltoids    Passive ROM Lt shoulder flex; ER, IR, abduction in  scapular plane; extension and horizontal abduction    Manual Traction through long arm at side 10-20 sec pull x 3-4 during treatment    Kinesiotex --   1 strip head of the humerus to scapula to facilitate scapular retraction; 1 strip upper trap to medial scapula to inhibit upper trap - taped bilat                 PT Education - 11/21/20 1201    Education Details [email or web address]    Person(s) Educated Patient    Methods Explanation;Handout    Comprehension Verbalized understanding               PT Long Term Goals - 10/30/20 1142      PT LONG TERM GOAL #1   Title Pt will be independent with HEP    Time 6    Period Weeks    Status New    Target Date 12/11/20      PT LONG TERM GOAL #2   Title Pt will improve FOTO to >= 65 to demo functional improvement    Baseline 47 on 10/30/20    Time 6    Period Weeks    Status New    Target Date 12/11/20      PT LONG TERM GOAL #3   Title Pt will improve Lt UE strength to 4/5 to perform IADLs with decreased pain    Baseline not  assessed due to precaution    Time 6    Period Weeks    Status New    Target Date 12/11/20      PT LONG TERM GOAL #4   Title Pt will improve Lt shoulder ROM to within 10 degrees of Rt shoulder to perform ADLs with decreased pain    Time 6    Period Weeks    Status New    Target Date 12/11/20                 Plan - 11/21/20 1301    Clinical Impression Statement Patient returns with report of significant increase in Lt shoulder pain since last Thursday and Friday. She reports "popping" and pain with movement; difficulty with moving Lt shoulder due to pain; difficulty sleeping. Re-evaluation reveals significant muscular tightness in Lt pec major/minor; no significant change in PROM. Reported pain is in the area of the pecs and attachment of pecs to humerus. Manual work through the pecs with gentle PROM reduced pain. Patient advised to avoid forward posture and add gentle stretching for pec. Trial of kinesotape to inhibit pecs and facilitate improved scapular alignment. Patient to contact MD office if symptoms persist.    Rehab Potential Good    PT Frequency 2x / week    PT Duration 6 weeks    PT Treatment/Interventions ADLs/Self Care Home Management;Cryotherapy;Moist Heat;Iontophoresis 4mg /ml Dexamethasone;Electrical Stimulation;Functional mobility training;Neuromuscular re-education;Therapeutic exercise;Therapeutic activities;Patient/family education;Manual techniques;Passive range of motion;Dry needling;Taping;Vasopneumatic Device    PT Next Visit Plan assess response to treatment including taping; continue PROM, AROM  and active exerise gradually progressing to strengthening as tolerated - needs work for posterior shoulder girdle strengthening    PT Home Exercise Plan [email or web address]    Consulted and Agree with Plan of Care Patient           Patient will benefit from skilled therapeutic intervention in order to improve the following deficits and impairments:     Visit  Diagnosis: Chronic left shoulder pain  Muscle weakness (generalized)  Other symptoms and signs involving the  musculoskeletal system     Problem List Patient Active Problem List   Diagnosis Date Noted  . Indication present for endocarditis prophylaxis 05/07/2020  . PVC (premature ventricular contraction) 05/07/2020  . Major depressive disorder with single episode, in partial remission ([place]) 05/07/2020  . Bursitis of left shoulder 02/08/2020  . Annual physical exam 01/10/2020  . Sleeping difficulties 01/10/2020  . Rheumatoid arthritis of multiple sites with negative rheumatoid factor ([place]) 12/04/2019  . Polyarthralgia 11/02/2019  . Myofascial pain 11/02/2019  . Arthralgia of both hands 10/03/2019  . Metatarsalgia of left foot 01/05/2018  . Other hyperlipidemia 03/23/2017  . OSA (obstructive sleep apnea) 02/25/2017  . Class 1 obesity without serious comorbidity with body mass index (BMI) of 34.0 to 34.9 in adult 08/17/2016  . Elevated liver function tests 08/17/2016  . Vitamin D deficiency 08/03/2016  . Left tennis elbow 04/06/2016  . 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 Depression 08/23/2015  . Subacromial bursitis 04/23/2015  . Left carpal tunnel syndrome, post right carpal tunnel release 12/24/2014  . Trochanteric bursitis of right hip 04/21/2013  . Postop Mild Hyponatremia 07/30/2011  . OSTEOARTHRITIS, KNEE, LEFT 12/12/2009  . MIGRAINE WITH AURA 05/31/2008  . SKIN CANCER, HX OF 01/06/2008  . COLITIS, HX OF 01/06/2008  . ALLERGIC RHINITIS 05/20/2007  . HYPERTENSION, BENIGN ESSENTIAL 11/23/2006  . SCIATICA, CHRONIC 05/19/2006    Benney [place] Nilda Simmer PT, MPH  11/21/2020, 1:07 PM  [facility] [place], [place], [address] Phone: [phone]   Fax:  [phone]  Name: MAHKAYLA PREECE MRN: [id number] Date of Birth: 1960/09/27

## 2020-11-25 ENCOUNTER — Other Ambulatory Visit (HOSPITAL_COMMUNITY): Payer: Self-pay

## 2020-11-25 MED FILL — Gabapentin Cap 300 MG: ORAL | 90 days supply | Qty: 270 | Fill #0 | Status: AC

## 2020-11-25 NOTE — Progress Notes (Signed)
HPI: FU palpitations. Echocardiogram September 2021 showed normal LV function, mild mitral regurgitation.  Monitor October 2021 showed sinus rhythm with PACs, PVCs, rare nonsustained SVT and 11 beats of nonsustained VT.  Symptoms correlated with PVCs.  Patient had Covid in August.  She was ill for approximately 12 days.  Since that time she developed palpitations described as heart skipping.    Since last seen she denies dyspnea, chest pain or syncope.  Her palpitations have improved.  Current Outpatient Medications  Medication Sig Dispense Refill  . AMBULATORY NON FORMULARY MEDICATION Medication Name: Please decrease CPAP setting to 8 cm of water pressure and fax [place] a download after 5 days.  Is having a lot of difficulty with air leaks is working to try to reduce her pressure to see if she is still being adequately treated but with fewer leaks.FAx to [place] 1 Units 0  . Calcium Carbonate-Vitamin D (CALTRATE 600+D PO) Take 1 tablet by mouth daily.    . clindamycin (CLEOCIN) 300 MG capsule TAKE 2 CAPSULES BY MOUTH ONCE FOR 1 DOSE 1 HOUR PRIOR TO DENTAL PROCEDURE. 4 capsule 0  . cyclobenzaprine (FLEXERIL) 5 MG tablet TAKE 1 TABLET BY MOUTH AT BEDTIME AS NEEDED FOR MUSCLE SPASMS 30 tablet 3  . EPINEPHrine 0.3 mg/0.3 mL IJ SOAJ injection Inject 0.3 mLs (0.3 mg total) into the muscle as needed for anaphylaxis. 2 each prn  . ergocalciferol (VITAMIN D2) 1.25 MG (50000 UT) capsule Take by mouth.    . estradiol (VIVELLE-DOT) 0.075 MG/24HR APPLY 1 PATCH ONTO THE SKIN 2 TIMES A WEEK 24 patch 4  . etanercept (ENBREL SURECLICK) 50 MG/ML injection Inject 1 pen (50mg ) into the skin once a week 4 mL 3  . folic acid (FOLVITE) 1 MG tablet Take 1 mg by mouth daily.    [name] gabapentin (NEURONTIN) 300 MG capsule TAKE 1 CAPSULE BY MOUTH EVERY MORNING AND 2 CAPSULES EVERY NIGHT AT BEDTIME 270 capsule 3  . hydrochlorothiazide (HYDRODIURIL) 25 MG tablet TAKE 1 TABLET BY MOUTH ONCE A DAY 90 tablet 0  .  losartan (COZAAR) 25 MG tablet TAKE 1 TABLET BY MOUTH ONCE A DAY 90 tablet 2  . Melatonin 3 MG TABS Take by mouth as directed.    . metoprolol succinate (TOPROL-XL) 25 MG 24 hr tablet TAKE 1 TABLET BY MOUTH AT BEDTIME 90 tablet 2  . Multiple Vitamins-Minerals (MULTIVITAMIN ADULT PO) Take by mouth daily.    . SUMAtriptan (IMITREX) 20 MG/ACT nasal spray PLACE 1 SPRAY INTO THE NOSE EVERY 2 HOURS AS NEEDED FOR HEADACHE 6 each 0  . tiZANidine (ZANAFLEX) 4 MG tablet Take 1 tablet by mouth every 6 hours as needed for spasm and pain 40 tablet 0  . tiZANidine (ZANAFLEX) 4 MG tablet Take 1 tablet by mouth every 6 hours as needed for spasm and pain 40 tablet 0  . Vilazodone HCl 20 MG TABS TAKE 1 TABLET BY MOUTH ONCE A DAY 90 tablet 0   No current facility-administered medications for this visit.     Past Medical History:  Diagnosis Date  . Anxiety   . Back pain   . 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 Depression   . Essential hypertension, benign   . Gastritis   . GERD (gastroesophageal reflux disease)   . Joint pain   . Lymphocytic colitis    microscopic  . Migraines   . Osteoarthritis   . Sciatica  right leg  . Swelling    feet, left foot    Past Surgical History:  Procedure Laterality Date  . ABDOMINAL HYSTERECTOMY  07-2005  . [place] Bunionectomy Left 09/18/2016   Left Foot  . BACK SURGERY    . KNEE SURGERY  2006   left; multiple  . microdiscetomy  2005  . SHOULDER SURGERY  07-22-09   right  . SKIN CANCER EXCISION     eyelid  . TOTAL KNEE ARTHROPLASTY  07/27/2011   Procedure: TOTAL KNEE ARTHROPLASTY;  Surgeon: Gearlean Alf;  Location: [facility];  Service: Orthopedics;  Laterality: Left;    Social History   Socioeconomic History  . Marital status: Married    Spouse name: Jenny Reichmann  . Number of children: 2  . Years of education: Not on file  . Highest education level: Not on file  Occupational History  . Occupation: Programmer, multimedia:    Tobacco Use  . Smoking status: Never Smoker  . Smokeless tobacco: Never Used  Vaping Use  . Vaping Use: Never used  Substance and Sexual Activity  . Alcohol use: Yes    Comment: occasionally  . Drug use: No  . 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  Other Topics Concern  . Not on file  Social History Narrative  . Not on file   Social Determinants of Health   Financial Resource Strain: Not on file  Food Insecurity: Not on file  Transportation Needs: Not on file  Physical Activity: Not on file  Stress: Not on file  Social Connections: Not on file  Intimate Partner Violence: Not on file    Family History  Problem Relation Age of Onset  . Breast cancer Other   . Tongue cancer Mother   . Breast cancer Mother   . Anxiety disorder Mother   . Depression Mother   . Heart disease Father   . Hypertension Father   . Hyperlipidemia Father   . Stroke Paternal Grandmother   . Colon cancer Neg Hx     ROS: She notes some fatigue but no fevers or chills, productive cough, hemoptysis, dysphasia, odynophagia, melena, hematochezia, dysuria, hematuria, rash, seizure activity, orthopnea, PND, pedal edema, claudication. Remaining systems are negative.  Physical Exam: Well-developed well-nourished in no acute distress.  Skin is warm and dry.  HEENT is normal.  Neck is supple.  Chest is clear to auscultation with normal expansion.  Cardiovascular exam is regular rate and rhythm.  Abdominal exam nontender or distended. No masses palpated. Extremities show no edema. neuro grossly intact  A/P  1 palpitations-symptoms have improved and she is experiencing some fatigue.  I will therefore change her Toprol to 25 mg daily as needed.  She can resume daily dosing if her palpitations worsen off of beta-blockade.  Note LV function is normal.  2 hypertension-blood pressure borderline but she follows this at  home and it is controlled.  Continue present medications and follow.  Kirk Ruths, MD

## 2020-11-26 ENCOUNTER — Encounter: Payer: Self-pay | Admitting: Rehabilitative and Restorative Service Providers"

## 2020-11-26 ENCOUNTER — Other Ambulatory Visit: Payer: Self-pay

## 2020-11-26 ENCOUNTER — Ambulatory Visit (INDEPENDENT_AMBULATORY_CARE_PROVIDER_SITE_OTHER): Payer: 59 | Admitting: Rehabilitative and Restorative Service Providers"

## 2020-11-26 ENCOUNTER — Other Ambulatory Visit (HOSPITAL_COMMUNITY): Payer: Self-pay

## 2020-11-26 DIAGNOSIS — M25512 Pain in left shoulder: Secondary | ICD-10-CM

## 2020-11-26 DIAGNOSIS — G8929 Other chronic pain: Secondary | ICD-10-CM

## 2020-11-26 DIAGNOSIS — R29898 Other symptoms and signs involving the musculoskeletal system: Secondary | ICD-10-CM | POA: Diagnosis not present

## 2020-11-26 DIAGNOSIS — M6281 Muscle weakness (generalized): Secondary | ICD-10-CM | POA: Diagnosis not present

## 2020-11-26 MED ORDER — PREDNISONE 10 MG (21) PO TBPK
ORAL_TABLET | ORAL | 0 refills | Status: DC
  Filled 2020-11-26: qty 21, 6d supply, fill #0

## 2020-11-26 NOTE — Patient Instructions (Signed)
Access Code: [email or web address]: [email or web address]: 05/17/2022Prepared by: Indiana Gamero HoltExercises  Circular Shoulder Pendulum with Table Support - 3-4 x daily - 7 x weekly - 1 sets - 20-30 reps  Standing Shoulder Extension with Dowel - 2 x daily - 7 x weekly - 1 sets - 5-10 reps - 5-10 sec hold  Standing Shoulder and Trunk Flexion at Table - 2 x daily - 7 x weekly - 1 sets - 3 reps - 10 sec hold  Seated Shoulder Flexion AAROM with Pulley Behind - 2 x daily - 7 x weekly - 1 sets - 10 reps - 10 sec hold  Seated Shoulder Scaption AAROM with Pulley at Side - 2 x daily - 7 x weekly - 1 sets - 10 reps - 10sec hold  Shoulder External Rotation and Scapular Retraction - 2 x daily - 7 x weekly - 1 sets - 10 reps - 5 sec hold  Shoulder External Rotation in 45 Degrees Abduction - 2 x daily - 7 x weekly - 1-2 sets - 10 reps - 3 sec hold  Standing Shoulder Internal Rotation Stretch with Towel - 2 x daily - 7 x weekly - 1 sets - 3-5 reps - 10-15 hold  Seated Shoulder W - 2 x daily - 7 x weekly - 1 sets - 10 reps - 3 sec hold  Standing Shoulder External Rotation with Resistance - 2 x daily - 7 x weekly - 1-3 sets - 10 reps - 2-3 sec hold  Standing Bilateral Low Shoulder Row with Anchored Resistance - 2 x daily - 7 x weekly - 1-3 sets - 10 reps - 2-3 sec hold  Shoulder Extension with Resistance - 2 x daily - 7 x weekly - 1-3 sets - 10 reps - 2-3 sec hold  Bilateral Scapular Depression with Anchored Resistance - Straight Arm - 2 x daily - 7 x weekly - 2 sets - 10 reps - 3-5 sec hold  Corner Pec Major Stretch - 2 x daily - 7 x weekly - 1 sets - 3 reps - 30 sec hold  Seated Upper Trap Stretch - 2 x daily - 7 x weekly - 1 sets - 3 reps - 10 sec hold Patient Education  Desk Discomfort: Troubleshooting Physical Symptoms  Desk Accessories to Increase Comfort  Tips for Setting Up Your Home Workstation

## 2020-11-26 NOTE — Therapy (Signed)
[place], [place], [address] Phone: [phone]   Fax:  [phone]  Physical Therapy Treatment  Patient Details  Name: Kathy Clark MRN: [id number] Date of Birth: Oct 31, 1960 Referring Provider (PT): chandler, justin   Encounter Date: 11/26/2020   PT End of Session - 11/26/20 1154    Visit Number 14    Number of Visits 19    Date for PT Re-Evaluation 12/11/20    PT Start Time 2952    PT Stop Time 1237    PT Time Calculation (min) 49 min    Activity Tolerance Patient tolerated treatment well           Past Medical History:  Diagnosis Date  . Anxiety   . Back pain   . 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 Depression   . Essential hypertension, benign   . Gastritis   . GERD (gastroesophageal reflux disease)   . Joint pain   . Lymphocytic colitis    microscopic  . Migraines   . Osteoarthritis   . Sciatica    right leg  . Swelling    feet, left foot    Past Surgical History:  Procedure Laterality Date  . ABDOMINAL HYSTERECTOMY  07-2005  . [place] Bunionectomy Left 09/18/2016   Left Foot  . BACK SURGERY    . KNEE SURGERY  2006   left; multiple  . microdiscetomy  2005  . SHOULDER SURGERY  07-22-09   right  . SKIN CANCER EXCISION     eyelid  . TOTAL KNEE ARTHROPLASTY  07/27/2011   Procedure: TOTAL KNEE ARTHROPLASTY;  Surgeon: Gearlean Alf;  Location: [facility];  Service: Orthopedics;  Laterality: Left;    There were no vitals filed for this visit.   Subjective Assessment - 11/26/20 1155    Subjective Patient reports that her shoulder is a little better. She still has pain in the shoulder. Patient spoke with PA and they will call in a predinose for short course. She was active over the weekend at her step son's wedding. She was in the sling for most of the day.    Currently in Pain? Yes    Pain Score 4     Pain Location Shoulder    Pain Orientation Left     Pain Descriptors / Indicators Sore;Aching              [facility] PT Assessment - 11/26/20 0001      Assessment   Medical Diagnosis left frozen shoulder    Referring Provider (PT) chandler, justin    Onset Date/Surgical Date 03/13/20    Hand Dominance Right      PROM   Left Shoulder Flexion 164 Degrees    Left Shoulder ABduction 164 Degrees   in scapular plane   Left Shoulder External Rotation 94 Degrees   in scapular plane                        [facility] Adult PT Treatment/Exercise - 11/26/20 0001      Shoulder Exercises: Standing   Other Standing Exercises scap squeeze with noodle 10 sec x 10 reps; L's x 10; W's x 10      Shoulder Exercises: Stretch   External Rotation Stretch 5 reps;10 seconds    Table Stretch - Flexion 5 reps;10 seconds;20 seconds    Other Shoulder Stretches prolonged snow angel ~ 2 min UE's at ~  70 to 80 deg abd      Moist Heat Therapy   Number Minutes Moist Heat 10 Minutes    Moist Heat Location Shoulder   upper trap Lt     Vasopneumatic   Number Minutes Vasopneumatic  10 minutes    Vasopnuematic Location  Shoulder    Vasopneumatic Pressure Low    Vasopneumatic Temperature  34      Manual Therapy   Manual therapy comments pt supine    Joint Mobilization joint circumduction    Soft tissue mobilization STM Lt pecs; upper trap, levator, deltoids    Passive ROM Lt shoulder flex; ER, IR, abduction in scapular plane; extension and horizontal abduction    Manual Traction through long arm at side 10-20 sec pull x 3-4 during treatment                  PT Education - 11/26/20 1250    Education Details desk ergonomic    Person(s) Educated Patient    Methods Explanation;Demonstration;Tactile cues;Verbal cues;Handout    Comprehension Verbalized understanding;Returned demonstration;Verbal cues required;Tactile cues required               PT Long Term Goals - 10/30/20 1142      PT LONG TERM GOAL #1   Title Pt will be  independent with HEP    Time 6    Period Weeks    Status New    Target Date 12/11/20      PT LONG TERM GOAL #2   Title Pt will improve FOTO to >= 65 to demo functional improvement    Baseline 47 on 10/30/20    Time 6    Period Weeks    Status New    Target Date 12/11/20      PT LONG TERM GOAL #3   Title Pt will improve Lt UE strength to 4/5 to perform IADLs with decreased pain    Baseline not assessed due to precaution    Time 6    Period Weeks    Status New    Target Date 12/11/20      PT LONG TERM GOAL #4   Title Pt will improve Lt shoulder ROM to within 10 degrees of Rt shoulder to perform ADLs with decreased pain    Time 6    Period Weeks    Status New    Target Date 12/11/20                 Plan - 11/26/20 1201    Clinical Impression Statement Some improvement in the Lt shoulder pain. PA reassured patient that she shoulder pain was likely from overuse and encouraged her to continue with gentle exercises and decrease activity. Patient continued with gentle exercise. Good response to manual work.    Rehab Potential Good    PT Frequency 2x / week    PT Duration 6 weeks    PT Treatment/Interventions ADLs/Self Care Home Management;Cryotherapy;Moist Heat;Iontophoresis 4mg /ml Dexamethasone;Electrical Stimulation;Functional mobility training;Neuromuscular re-education;Therapeutic exercise;Therapeutic activities;Patient/family education;Manual techniques;Passive range of motion;Dry needling;Taping;Vasopneumatic Device    PT Next Visit Plan continue with mangement of pain and PROM, gentle AROM; gradually add active exerise gradually progressing to strengthening as tolerated - needs work for posterior shoulder girdle strengthening as tolerated    PT [place]    Consulted and Agree with Plan of Care Patient           Patient will benefit from skilled therapeutic intervention in order to improve the  following deficits and impairments:     Visit  Diagnosis: Chronic left shoulder pain  Muscle weakness (generalized)  Other symptoms and signs involving the musculoskeletal system     Problem List Patient Active Problem List   Diagnosis Date Noted  . Indication present for endocarditis prophylaxis 05/07/2020  . PVC (premature ventricular contraction) 05/07/2020  . Major depressive disorder with single episode, in partial remission ([place]) 05/07/2020  . Bursitis of left shoulder 02/08/2020  . Annual physical exam 01/10/2020  . Sleeping difficulties 01/10/2020  . Rheumatoid arthritis of multiple sites with negative rheumatoid factor ([place]) 12/04/2019  . Polyarthralgia 11/02/2019  . Myofascial pain 11/02/2019  . Arthralgia of both hands 10/03/2019  . Metatarsalgia of left foot 01/05/2018  . Other hyperlipidemia 03/23/2017  . OSA (obstructive sleep apnea) 02/25/2017  . Class 1 obesity without serious comorbidity with body mass index (BMI) of 34.0 to 34.9 in adult 08/17/2016  . Elevated liver function tests 08/17/2016  . Vitamin D deficiency 08/03/2016  . Left tennis elbow 04/06/2016  . 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 Depression 08/23/2015  . Subacromial bursitis 04/23/2015  . Left carpal tunnel syndrome, post right carpal tunnel release 12/24/2014  . Trochanteric bursitis of right hip 04/21/2013  . Postop Mild Hyponatremia 07/30/2011  . OSTEOARTHRITIS, KNEE, LEFT 12/12/2009  . MIGRAINE WITH AURA 05/31/2008  . SKIN CANCER, HX OF 01/06/2008  . COLITIS, HX OF 01/06/2008  . ALLERGIC RHINITIS 05/20/2007  . HYPERTENSION, BENIGN ESSENTIAL 11/23/2006  . SCIATICA, CHRONIC 05/19/2006    Jamonta Goerner Nilda Simmer PT, MPH  11/26/2020, 12:55 PM  [facility] [place], [place], [address] Phone: [phone]   Fax:  [phone]  Name: JUSTYCE YEATER MRN: [id number] Date of Birth: Aug 10, 1960

## 2020-11-27 ENCOUNTER — Other Ambulatory Visit (HOSPITAL_COMMUNITY): Payer: Self-pay

## 2020-11-28 ENCOUNTER — Encounter: Payer: Self-pay | Admitting: Rehabilitative and Restorative Service Providers"

## 2020-11-28 ENCOUNTER — Other Ambulatory Visit: Payer: Self-pay

## 2020-11-28 ENCOUNTER — Ambulatory Visit (INDEPENDENT_AMBULATORY_CARE_PROVIDER_SITE_OTHER): Payer: 59 | Admitting: Rehabilitative and Restorative Service Providers"

## 2020-11-28 ENCOUNTER — Other Ambulatory Visit (HOSPITAL_COMMUNITY): Payer: Self-pay

## 2020-11-28 DIAGNOSIS — M6281 Muscle weakness (generalized): Secondary | ICD-10-CM

## 2020-11-28 DIAGNOSIS — M25512 Pain in left shoulder: Secondary | ICD-10-CM | POA: Diagnosis not present

## 2020-11-28 DIAGNOSIS — R29898 Other symptoms and signs involving the musculoskeletal system: Secondary | ICD-10-CM

## 2020-11-28 DIAGNOSIS — G8929 Other chronic pain: Secondary | ICD-10-CM | POA: Diagnosis not present

## 2020-11-28 NOTE — Therapy (Signed)
[place], [place], [address] Phone: [phone]   Fax:  [phone]  Physical Therapy Treatment  Patient Details  Name: Kathy Clark MRN: [id number] Date of Birth: 29-Dec-1960 Referring Provider (PT): chandler, justin   Encounter Date: 11/28/2020   PT End of Session - 11/28/20 1446    Visit Number 15    Number of Visits 19    Date for PT Re-Evaluation 12/11/20    PT Start Time 9937    PT Stop Time 1538    PT Time Calculation (min) 53 min    Activity Tolerance Patient tolerated treatment well           Past Medical History:  Diagnosis Date  . Anxiety   . Back pain   . 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 Depression   . Essential hypertension, benign   . Gastritis   . GERD (gastroesophageal reflux disease)   . Joint pain   . Lymphocytic colitis    microscopic  . Migraines   . Osteoarthritis   . Sciatica    right leg  . Swelling    feet, left foot    Past Surgical History:  Procedure Laterality Date  . ABDOMINAL HYSTERECTOMY  07-2005  . [place] Bunionectomy Left 09/18/2016   Left Foot  . BACK SURGERY    . KNEE SURGERY  2006   left; multiple  . microdiscetomy  2005  . SHOULDER SURGERY  07-22-09   right  . SKIN CANCER EXCISION     eyelid  . TOTAL KNEE ARTHROPLASTY  07/27/2011   Procedure: TOTAL KNEE ARTHROPLASTY;  Surgeon: Gearlean Alf;  Location: [facility];  Service: Orthopedics;  Laterality: Left;    There were no vitals filed for this visit.   Subjective Assessment - 11/28/20 1447    Subjective Shoulder may be a bit better but is irritated from driving to therapy. Practiced piano for a short time. has started a prednisone dose pack and is still taking the muscle relaxant. She is not taking as much tylenol. She is still using ice for pain management. Has made ergonomic corrections at desj/work statiion based on handouts from last visit    Currently  in Pain? Yes    Pain Score 4     Pain Location Shoulder    Pain Orientation Left    Pain Descriptors / Indicators Aching;Sore    Pain Type Chronic pain;Surgical pain                             [facility] Adult PT Treatment/Exercise - 11/28/20 0001      Shoulder Exercises: Standing   Other Standing Exercises scap squeeze with noodle 10 sec x 10 reps; L's x 10; W's x 10      Shoulder Exercises: ROM/Strengthening   Pendulum 30 CW/30 CCW      Shoulder Exercises: Stretch   External Rotation Stretch 5 reps;10 seconds    Table Stretch - Flexion 5 reps;10 seconds;20 seconds    Other Shoulder Stretches shoulder and elbow extension sitting with UE's extended behind back 15-20 sec x 2 reps    Other Shoulder Stretches prolonged snow angel ~ 2 min UE's at ~ 70 to 80 deg abd      Moist Heat Therapy   Number Minutes Moist Heat 15 Minutes    Moist Heat Location Shoulder   upper trap Lt  Vasopneumatic   Number Minutes Vasopneumatic  15 minutes    Vasopnuematic Location  Shoulder    Vasopneumatic Pressure Low    Vasopneumatic Temperature  34      Manual Therapy   Manual therapy comments pt supine    Joint Mobilization joint circumduction    Soft tissue mobilization STM Lt pecs; upper trap, levator, deltoids    Passive ROM Lt shoulder flex; ER, IR, abduction in scapular plane; extension and horizontal abduction    Manual Traction through long arm at side 10-20 sec pull x 3-4 during treatment                  PT Education - 11/28/20 1535    Education Details HEP    Person(s) Educated Patient    Methods Explanation;Demonstration;Tactile cues;Verbal cues    Comprehension Verbalized understanding;Returned demonstration;Verbal cues required;Tactile cues required               PT Long Term Goals - 10/30/20 1142      PT LONG TERM GOAL #1   Title Pt will be independent with HEP    Time 6    Period Weeks    Status New    Target Date 12/11/20      PT LONG  TERM GOAL #2   Title Pt will improve FOTO to >= 65 to demo functional improvement    Baseline 47 on 10/30/20    Time 6    Period Weeks    Status New    Target Date 12/11/20      PT LONG TERM GOAL #3   Title Pt will improve Lt UE strength to 4/5 to perform IADLs with decreased pain    Baseline not assessed due to precaution    Time 6    Period Weeks    Status New    Target Date 12/11/20      PT LONG TERM GOAL #4   Title Pt will improve Lt shoulder ROM to within 10 degrees of Rt shoulder to perform ADLs with decreased pain    Time 6    Period Weeks    Status New    Target Date 12/11/20                 Plan - 11/28/20 1455    Clinical Impression Statement Some continued irrtation and pain in the Lt shoulder. Note muscular tightness thorugh the upper traps, biceps. Less tightness noted in the pecs. Improving quality of movement.    Rehab Potential Good    PT Frequency 2x / week    PT Duration 6 weeks    PT Treatment/Interventions ADLs/Self Care Home Management;Cryotherapy;Moist Heat;Iontophoresis 4mg /ml Dexamethasone;Electrical Stimulation;Functional mobility training;Neuromuscular re-education;Therapeutic exercise;Therapeutic activities;Patient/family education;Manual techniques;Passive range of motion;Dry needling;Taping;Vasopneumatic Device    PT Next Visit Plan continue with mangement of pain and PROM, gentle AROM; gradually add active exerise gradually progressing to strengthening as tolerated - needs work for posterior shoulder girdle strengthening as tolerated    PT Home Exercise Plan [id number]    Consulted and Agree with Plan of Care Patient           Patient will benefit from skilled therapeutic intervention in order to improve the following deficits and impairments:     Visit Diagnosis: Chronic left shoulder pain  Muscle weakness (generalized)  Other symptoms and signs involving the musculoskeletal system     Problem List Patient Active Problem List    Diagnosis Date Noted  . Indication present for endocarditis prophylaxis  05/07/2020  . PVC (premature ventricular contraction) 05/07/2020  . Major depressive disorder with single episode, in partial remission ([place]) 05/07/2020  . Bursitis of left shoulder 02/08/2020  . Annual physical exam 01/10/2020  . Sleeping difficulties 01/10/2020  . Rheumatoid arthritis of multiple sites with negative rheumatoid factor ([place]) 12/04/2019  . Polyarthralgia 11/02/2019  . Myofascial pain 11/02/2019  . Arthralgia of both hands 10/03/2019  . Metatarsalgia of left foot 01/05/2018  . Other hyperlipidemia 03/23/2017  . OSA (obstructive sleep apnea) 02/25/2017  . Class 1 obesity without serious comorbidity with body mass index (BMI) of 34.0 to 34.9 in adult 08/17/2016  . Elevated liver function tests 08/17/2016  . Vitamin D deficiency 08/03/2016  . Left tennis elbow 04/06/2016  . 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 Depression 08/23/2015  . Subacromial bursitis 04/23/2015  . Left carpal tunnel syndrome, post right carpal tunnel release 12/24/2014  . Trochanteric bursitis of right hip 04/21/2013  . Postop Mild Hyponatremia 07/30/2011  . OSTEOARTHRITIS, KNEE, LEFT 12/12/2009  . MIGRAINE WITH AURA 05/31/2008  . SKIN CANCER, HX OF 01/06/2008  . COLITIS, HX OF 01/06/2008  . ALLERGIC RHINITIS 05/20/2007  . HYPERTENSION, BENIGN ESSENTIAL 11/23/2006  . SCIATICA, CHRONIC 05/19/2006    Kenda Kloehn Nilda Simmer PT, MPH  11/28/2020, 3:38 PM  [facility] [place], [place], [address] Phone: [phone]   Fax:  [phone]  Name: Kathy Clark MRN: [id number] Date of Birth: 06/17/1961

## 2020-11-28 NOTE — Patient Instructions (Signed)
Shoulder and elbow extension in sitting  No wt through the UE - gentle stretch  10-20 sec x 2 reps

## 2020-11-29 ENCOUNTER — Other Ambulatory Visit (HOSPITAL_COMMUNITY): Payer: Self-pay

## 2020-11-29 ENCOUNTER — Other Ambulatory Visit: Payer: Self-pay | Admitting: Pharmacist

## 2020-11-29 ENCOUNTER — Other Ambulatory Visit: Payer: Self-pay

## 2020-11-29 MED ORDER — ENBREL SURECLICK 50 MG/ML ~~LOC~~ SOAJ: SUBCUTANEOUS | 3 refills | Status: DC

## 2020-11-29 MED ORDER — ENBREL SURECLICK 50 MG/ML ~~LOC~~ SOAJ
SUBCUTANEOUS | 3 refills | Status: DC
  Filled 2020-11-29: qty 4, 28d supply, fill #0
  Filled 2020-11-29: qty 4, fill #0

## 2020-11-30 ENCOUNTER — Other Ambulatory Visit (HOSPITAL_COMMUNITY): Payer: Self-pay

## 2020-12-02 ENCOUNTER — Other Ambulatory Visit (HOSPITAL_COMMUNITY): Payer: Self-pay

## 2020-12-02 DIAGNOSIS — M0609 Rheumatoid arthritis without rheumatoid factor, multiple sites: Secondary | ICD-10-CM | POA: Diagnosis not present

## 2020-12-02 DIAGNOSIS — Z79899 Other long term (current) drug therapy: Secondary | ICD-10-CM | POA: Diagnosis not present

## 2020-12-03 ENCOUNTER — Other Ambulatory Visit: Payer: Self-pay

## 2020-12-03 ENCOUNTER — Encounter: Payer: Self-pay | Admitting: Rehabilitative and Restorative Service Providers"

## 2020-12-03 ENCOUNTER — Other Ambulatory Visit (HOSPITAL_COMMUNITY): Payer: Self-pay

## 2020-12-03 ENCOUNTER — Ambulatory Visit (INDEPENDENT_AMBULATORY_CARE_PROVIDER_SITE_OTHER): Payer: 59 | Admitting: Rehabilitative and Restorative Service Providers"

## 2020-12-03 DIAGNOSIS — G8929 Other chronic pain: Secondary | ICD-10-CM | POA: Diagnosis not present

## 2020-12-03 DIAGNOSIS — M6281 Muscle weakness (generalized): Secondary | ICD-10-CM | POA: Diagnosis not present

## 2020-12-03 DIAGNOSIS — M25512 Pain in left shoulder: Secondary | ICD-10-CM | POA: Diagnosis not present

## 2020-12-03 DIAGNOSIS — R29898 Other symptoms and signs involving the musculoskeletal system: Secondary | ICD-10-CM | POA: Diagnosis not present

## 2020-12-03 MED ORDER — TIZANIDINE HCL 4 MG PO TABS
ORAL_TABLET | ORAL | 0 refills | Status: DC
  Filled 2020-12-03: qty 40, 10d supply, fill #0

## 2020-12-03 NOTE — Therapy (Signed)
[place], [place], [address] Phone: [phone]   Fax:  [phone]  Physical Therapy Treatment  Patient Details  Name: Kathy Clark MRN: [id number] Date of Birth: May 21, 1961 Referring Provider (PT): chandler, justin   Encounter Date: 12/03/2020   PT End of Session - 12/03/20 1149    Visit Number 16    Number of Visits 19    Date for PT Re-Evaluation 12/11/20    PT Start Time 5638    PT Stop Time 1238   [place] end of treatment   PT Time Calculation (min) 53 min    Activity Tolerance Patient tolerated treatment well           Past Medical History:  Diagnosis Date  . Anxiety   . Back pain   . 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 Depression   . Essential hypertension, benign   . Gastritis   . GERD (gastroesophageal reflux disease)   . Joint pain   . Lymphocytic colitis    microscopic  . Migraines   . Osteoarthritis   . Sciatica    right leg  . Swelling    feet, left foot    Past Surgical History:  Procedure Laterality Date  . ABDOMINAL HYSTERECTOMY  07-2005  . [place] Bunionectomy Left 09/18/2016   Left Foot  . BACK SURGERY    . KNEE SURGERY  2006   left; multiple  . microdiscetomy  2005  . SHOULDER SURGERY  07-22-09   right  . SKIN CANCER EXCISION     eyelid  . TOTAL KNEE ARTHROPLASTY  07/27/2011   Procedure: TOTAL KNEE ARTHROPLASTY;  Surgeon: Gearlean Alf;  Location: [facility];  Service: Orthopedics;  Laterality: Left;    There were no vitals filed for this visit.   Subjective Assessment - 12/03/20 1150    Subjective Patient reports that she continues to have shoulder pain on a daily basis. She is frustrated with the continued pain and inability to use Lt UE.She was almost pain free with the steroid dose pack but symptoms increased when meds were gone. Can get good relief in pain with muscle relaxant    Currently in Pain? Yes    Pain Score 4      Pain Location Shoulder    Pain Orientation Left    Pain Descriptors / Indicators Aching;Sore    Pain Type Chronic pain;Surgical pain    Pain Onset More than a month ago    Pain Frequency Intermittent   when taking muscle relaxant   Aggravating Factors  movement forward                             [facility] Adult PT Treatment/Exercise - 12/03/20 0001      Shoulder Exercises: Standing   Other Standing Exercises scap squeeze with noodle 10 sec x 10 reps; L's x 10; W's x 10      Shoulder Exercises: Pulleys   Flexion 2 minutes    Scaption 1 minute      Shoulder Exercises: ROM/Strengthening   Pendulum 30 CW/30 CCW      Shoulder Exercises: Stretch   External Rotation Stretch 5 reps;10 seconds    Table Stretch - Flexion 5 reps;10 seconds;20 seconds    Other Shoulder Stretches shoulder and elbow extension sitting with UE's extended behind back 15-20 sec x 2 reps    Other Shoulder Stretches  prolonged snow angel ~ 2 min UE's at ~ 70 to 80 deg abd      Moist Heat Therapy   Number Minutes Moist Heat 15 Minutes    Moist Heat Location Shoulder;Cervical      Manual Therapy   Manual therapy comments skilled palpation to assess response to DN/manual work    Soft tissue mobilization STM Lt pecs; upper trap, levator, biceps    Passive ROM Lt shoulder flex; ER, IR, abduction in scapular plane; extension and horizontal abduction    Manual Traction through long arm at side 10-20 sec pull x 3-4 during treatment            Trigger Point Dry Needling - 12/03/20 0001    Consent Given? Yes    Education Handout Provided Yes    Other Dry Needling Lt    Pectoralis Major Response Palpable increased muscle length    Pectoralis Minor Response Palpable increased muscle length    Deltoid Response Palpable increased muscle length    Biceps Response Palpable increased muscle length                PT Education - 12/03/20 1236    Education Details DN    Person(s) Educated Patient     Methods Explanation;Handout    Comprehension Verbalized understanding               PT Long Term Goals - 10/30/20 1142      PT LONG TERM GOAL #1   Title Pt will be independent with HEP    Time 6    Period Weeks    Status New    Target Date 12/11/20      PT LONG TERM GOAL #2   Title Pt will improve FOTO to >= 65 to demo functional improvement    Baseline 47 on 10/30/20    Time 6    Period Weeks    Status New    Target Date 12/11/20      PT LONG TERM GOAL #3   Title Pt will improve Lt UE strength to 4/5 to perform IADLs with decreased pain    Baseline not assessed due to precaution    Time 6    Period Weeks    Status New    Target Date 12/11/20      PT LONG TERM GOAL #4   Title Pt will improve Lt shoulder ROM to within 10 degrees of Rt shoulder to perform ADLs with decreased pain    Time 6    Period Weeks    Status New    Target Date 12/11/20                 Plan - 12/03/20 1236    Clinical Impression Statement Continued pain Lt shoulder with functional activities. Good relief of pain with muscle relaxants. Increase in pain with reaching forward; functional activities. Trial of DN to Lt pec major/minor; deltoid; biceps with palpable decrease in muscular tightness.    Rehab Potential Good    PT Frequency 2x / week    PT Duration 6 weeks    PT Treatment/Interventions ADLs/Self Care Home Management;Cryotherapy;Moist Heat;Iontophoresis 4mg /ml Dexamethasone;Electrical Stimulation;Functional mobility training;Neuromuscular re-education;Therapeutic exercise;Therapeutic activities;Patient/family education;Manual techniques;Passive range of motion;Dry needling;Taping;Vasopneumatic Device    PT Next Visit Plan continue with mangement of pain and PROM, gentle AROM; gradually add active exerise gradually progressing to strengthening as tolerated - needs work for posterior shoulder girdle strengthening as tolerated - access response to DN  PT Home Exercise Plan [id number]     Consulted and Agree with Plan of Care Patient           Patient will benefit from skilled therapeutic intervention in order to improve the following deficits and impairments:     Visit Diagnosis: Chronic left shoulder pain  Muscle weakness (generalized)  Other symptoms and signs involving the musculoskeletal system     Problem List Patient Active Problem List   Diagnosis Date Noted  . Indication present for endocarditis prophylaxis 05/07/2020  . PVC (premature ventricular contraction) 05/07/2020  . Major depressive disorder with single episode, in partial remission ([place]) 05/07/2020  . Bursitis of left shoulder 02/08/2020  . Annual physical exam 01/10/2020  . Sleeping difficulties 01/10/2020  . Rheumatoid arthritis of multiple sites with negative rheumatoid factor ([place]) 12/04/2019  . Polyarthralgia 11/02/2019  . Myofascial pain 11/02/2019  . Arthralgia of both hands 10/03/2019  . Metatarsalgia of left foot 01/05/2018  . Other hyperlipidemia 03/23/2017  . OSA (obstructive sleep apnea) 02/25/2017  . Class 1 obesity without serious comorbidity with body mass index (BMI) of 34.0 to 34.9 in adult 08/17/2016  . Elevated liver function tests 08/17/2016  . Vitamin D deficiency 08/03/2016  . Left tennis elbow 04/06/2016  . 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 Depression 08/23/2015  . Subacromial bursitis 04/23/2015  . Left carpal tunnel syndrome, post right carpal tunnel release 12/24/2014  . Trochanteric bursitis of right hip 04/21/2013  . Postop Mild Hyponatremia 07/30/2011  . OSTEOARTHRITIS, KNEE, LEFT 12/12/2009  . MIGRAINE WITH AURA 05/31/2008  . SKIN CANCER, HX OF 01/06/2008  . COLITIS, HX OF 01/06/2008  . ALLERGIC RHINITIS 05/20/2007  . HYPERTENSION, BENIGN ESSENTIAL 11/23/2006  . SCIATICA, CHRONIC 05/19/2006    Aleesha Ringstad Nilda Simmer PT, MPH  12/03/2020, 12:40 PM  [facility] [place], [place],  [address] Phone: [phone]   Fax:  [phone]  Name: ALLIE OUSLEY MRN: [id number] Date of Birth: January 17, 1961

## 2020-12-03 NOTE — Patient Instructions (Signed)

## 2020-12-04 ENCOUNTER — Encounter: Payer: Self-pay | Admitting: Cardiology

## 2020-12-04 ENCOUNTER — Ambulatory Visit: Payer: 59 | Admitting: Cardiology

## 2020-12-04 VITALS — BP 140/80 | HR 60 | Ht 68.0 in | Wt 250.2 lb

## 2020-12-04 DIAGNOSIS — R002 Palpitations: Secondary | ICD-10-CM | POA: Diagnosis not present

## 2020-12-04 DIAGNOSIS — I10 Essential (primary) hypertension: Secondary | ICD-10-CM | POA: Diagnosis not present

## 2020-12-04 MED ORDER — METOPROLOL SUCCINATE ER 25 MG PO TB24: 25.0000 mg | ORAL_TABLET | Freq: Every day | ORAL | 2 refills | Status: DC | PRN

## 2020-12-04 NOTE — Patient Instructions (Signed)

## 2020-12-05 ENCOUNTER — Other Ambulatory Visit: Payer: Self-pay

## 2020-12-05 ENCOUNTER — Encounter: Payer: Self-pay | Admitting: Rehabilitative and Restorative Service Providers"

## 2020-12-05 ENCOUNTER — Ambulatory Visit (INDEPENDENT_AMBULATORY_CARE_PROVIDER_SITE_OTHER): Payer: 59 | Admitting: Rehabilitative and Restorative Service Providers"

## 2020-12-05 DIAGNOSIS — G8929 Other chronic pain: Secondary | ICD-10-CM | POA: Diagnosis not present

## 2020-12-05 DIAGNOSIS — M6281 Muscle weakness (generalized): Secondary | ICD-10-CM

## 2020-12-05 DIAGNOSIS — M25512 Pain in left shoulder: Secondary | ICD-10-CM

## 2020-12-05 DIAGNOSIS — R29898 Other symptoms and signs involving the musculoskeletal system: Secondary | ICD-10-CM | POA: Diagnosis not present

## 2020-12-05 NOTE — Therapy (Signed)
[place], [place], [address] Phone: [phone]   Fax:  [phone]  Physical Therapy Treatment  Patient Details  Name: Kathy Clark MRN: [id number] Date of Birth: Nov 10, 1960 Referring Provider (PT): chandler, justin   Encounter Date: 12/05/2020   PT End of Session - 12/05/20 1358    Visit Number 17    Number of Visits 19    Date for PT Re-Evaluation 12/11/20    PT Start Time 8882    PT Stop Time 1447    PT Time Calculation (min) 49 min    Activity Tolerance Patient tolerated treatment well           Past Medical History:  Diagnosis Date  . Anxiety   . Back pain   . 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 Depression   . Essential hypertension, benign   . Gastritis   . GERD (gastroesophageal reflux disease)   . Joint pain   . Lymphocytic colitis    microscopic  . Migraines   . Osteoarthritis   . Sciatica    right leg  . Swelling    feet, left foot    Past Surgical History:  Procedure Laterality Date  . ABDOMINAL HYSTERECTOMY  07-2005  . [place] Bunionectomy Left 09/18/2016   Left Foot  . BACK SURGERY    . KNEE SURGERY  2006   left; multiple  . microdiscetomy  2005  . SHOULDER SURGERY  07-22-09   right  . SKIN CANCER EXCISION     eyelid  . TOTAL KNEE ARTHROPLASTY  07/27/2011   Procedure: TOTAL KNEE ARTHROPLASTY;  Surgeon: Gearlean Alf;  Location: [facility];  Service: Orthopedics;  Laterality: Left;    There were no vitals filed for this visit.   Subjective Assessment - 12/05/20 1359    Subjective DN helped the muscle tightness in the shoulder. Can still tell a big difference when she takes the muscle relaxant. She is now having more pain and tightness in the neck. She has decreased activity level in the past couple of days.    Currently in Pain? Yes    Pain Score 3     Pain Location Shoulder    Pain Orientation Left    Pain Descriptors /  Indicators Aching;Sore    Pain Type Chronic pain;Surgical pain                             [facility] Adult PT Treatment/Exercise - 12/05/20 0001      Shoulder Exercises: Standing   Other Standing Exercises scap squeeze with noodle 10 sec x 10 reps; L's x 10; W's x 10      Shoulder Exercises: Pulleys   Flexion 2 minutes    Scaption 1 minute    Other Pulley Exercises horizontal ab/ad hands level moving hands in and out      Shoulder Exercises: Therapy Ball   Other Therapy Ball Exercises scapular depression pressing into the ball added lateral cervical flexion to stretch neck 10-15 sec hold x 4 reps      Shoulder Exercises: ROM/Strengthening   Pendulum 30 CW/30 CCW      Shoulder Exercises: Stretch   External Rotation Stretch 5 reps;10 seconds    Other Shoulder Stretches prolonged snow angel ~ 2 min UE's at ~ 70 to 80 deg abd      Moist Heat Therapy   Number Minutes  Moist Heat 12 Minutes    Moist Heat Location Shoulder;Cervical      Manual Therapy   Manual therapy comments skilled palpation to assess response to DN/manual work    Soft tissue mobilization STM Lt pecs; upper trap, levator, biceps    Passive ROM Lt shoulder flex; ER, IR, abduction in scapular plane; extension and horizontal abduction    Manual Traction through long arm at side 10-20 sec pull x 3-4 during treatment            Trigger Point Dry Needling - 12/05/20 0001    Consent Given? Yes    Education Handout Provided Previously provided    Other Dry Needling Lt    Sternocleidomastoid Response Palpable increased muscle length;Twitch response elicited    Upper Trapezius Response Palpable increased muscle length;Twitch reponse elicited                PT Education - 12/05/20 1441    Education Details HEP    Person(s) Educated Patient    Methods Explanation;Demonstration;Tactile cues;Verbal cues;Handout    Comprehension Verbalized understanding;Returned demonstration;Verbal cues  required;Tactile cues required               PT Long Term Goals - 10/30/20 1142      PT LONG TERM GOAL #1   Title Pt will be independent with HEP    Time 6    Period Weeks    Status New    Target Date 12/11/20      PT LONG TERM GOAL #2   Title Pt will improve FOTO to >= 65 to demo functional improvement    Baseline 47 on 10/30/20    Time 6    Period Weeks    Status New    Target Date 12/11/20      PT LONG TERM GOAL #3   Title Pt will improve Lt UE strength to 4/5 to perform IADLs with decreased pain    Baseline not assessed due to precaution    Time 6    Period Weeks    Status New    Target Date 12/11/20      PT LONG TERM GOAL #4   Title Pt will improve Lt shoulder ROM to within 10 degrees of Rt shoulder to perform ADLs with decreased pain    Time 6    Period Weeks    Status New    Target Date 12/11/20                 Plan - 12/05/20 1407    Clinical Impression Statement Positive response to DN with some decreased in muscular tightness. Reports tightness in the Lt side of neck in the past 1-2 days. Added stretching for lateral cervical flexion. Trial of manual work and DN for Lt lateral cervical musculature.    Rehab Potential Good    PT Frequency 2x / week    PT Duration 6 weeks    PT Treatment/Interventions ADLs/Self Care Home Management;Cryotherapy;Moist Heat;Iontophoresis 4mg /ml Dexamethasone;Electrical Stimulation;Functional mobility training;Neuromuscular re-education;Therapeutic exercise;Therapeutic activities;Patient/family education;Manual techniques;Passive range of motion;Dry needling;Taping;Vasopneumatic Device    PT Next Visit Plan continue with mangement of pain and PROM, gentle AROM; gradually add active exerise gradually progressing to strengthening as tolerated - needs work for posterior shoulder girdle strengthening as tolerated - access response to DN    PT Home Exercise Plan [id number]    Consulted and Agree with Plan of Care Patient            Patient will  benefit from skilled therapeutic intervention in order to improve the following deficits and impairments:     Visit Diagnosis: Chronic left shoulder pain  Muscle weakness (generalized)  Other symptoms and signs involving the musculoskeletal system     Problem List Patient Active Problem List   Diagnosis Date Noted  . Indication present for endocarditis prophylaxis 05/07/2020  . PVC (premature ventricular contraction) 05/07/2020  . Major depressive disorder with single episode, in partial remission ([place]) 05/07/2020  . Bursitis of left shoulder 02/08/2020  . Annual physical exam 01/10/2020  . Sleeping difficulties 01/10/2020  . Rheumatoid arthritis of multiple sites with negative rheumatoid factor ([place]) 12/04/2019  . Polyarthralgia 11/02/2019  . Myofascial pain 11/02/2019  . Arthralgia of both hands 10/03/2019  . Metatarsalgia of left foot 01/05/2018  . Other hyperlipidemia 03/23/2017  . OSA (obstructive sleep apnea) 02/25/2017  . Class 1 obesity without serious comorbidity with body mass index (BMI) of 34.0 to 34.9 in adult 08/17/2016  . Elevated liver function tests 08/17/2016  . Vitamin D deficiency 08/03/2016  . Left tennis elbow 04/06/2016  . 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 Depression 08/23/2015  . Subacromial bursitis 04/23/2015  . Left carpal tunnel syndrome, post right carpal tunnel release 12/24/2014  . Trochanteric bursitis of right hip 04/21/2013  . Postop Mild Hyponatremia 07/30/2011  . OSTEOARTHRITIS, KNEE, LEFT 12/12/2009  . MIGRAINE WITH AURA 05/31/2008  . SKIN CANCER, HX OF 01/06/2008  . COLITIS, HX OF 01/06/2008  . ALLERGIC RHINITIS 05/20/2007  . HYPERTENSION, BENIGN ESSENTIAL 11/23/2006  . SCIATICA, CHRONIC 05/19/2006    Mikle Sternberg Nilda Simmer PT, MPH  12/05/2020, 2:48 PM  [facility] [place], [place], [address] Phone: [phone]   Fax:  [phone]  Name: LATEYA DAURIA MRN: [id number] Date of Birth: 11-30-1960

## 2020-12-05 NOTE — Patient Instructions (Signed)
Access Code: [email or web address]: 05/26/2022Prepared by: Kenndra Morris HoltExercises  Circular Shoulder Pendulum with Table Support - 3-4 x daily - 7 x weekly - 1 sets - 20-30 reps  Standing Shoulder Extension with Dowel - 2 x daily - 7 x weekly - 1 sets - 5-10 reps - 5-10 sec hold  Standing Shoulder and Trunk Flexion at Table - 2 x daily - 7 x weekly - 1 sets - 3 reps - 10 sec hold  Seated Shoulder Flexion AAROM with Pulley Behind - 2 x daily - 7 x weekly - 1 sets - 10 reps - 10 sec hold  Seated Shoulder Scaption AAROM with Pulley at Side - 2 x daily - 7 x weekly - 1 sets - 10 reps - 10sec hold  Shoulder External Rotation and Scapular Retraction - 2 x daily - 7 x weekly - 1 sets - 10 reps - 5 sec hold  Shoulder External Rotation in 45 Degrees Abduction - 2 x daily - 7 x weekly - 1-2 sets - 10 reps - 3 sec hold  Standing Shoulder Internal Rotation Stretch with Towel - 2 x daily - 7 x weekly - 1 sets - 3-5 reps - 10-15 hold  Seated Shoulder W - 2 x daily - 7 x weekly - 1 sets - 10 reps - 3 sec hold  Standing Shoulder External Rotation with Resistance - 2 x daily - 7 x weekly - 1-3 sets - 10 reps - 2-3 sec hold  Standing Bilateral Low Shoulder Row with Anchored Resistance - 2 x daily - 7 x weekly - 1-3 sets - 10 reps - 2-3 sec hold  Shoulder Extension with Resistance - 2 x daily - 7 x weekly - 1-3 sets - 10 reps - 2-3 sec hold  Bilateral Scapular Depression with Anchored Resistance - Straight Arm - 2 x daily - 7 x weekly - 2 sets - 10 reps - 3-5 sec hold  Corner Pec Major Stretch - 2 x daily - 7 x weekly - 1 sets - 3 reps - 30 sec hold  Seated Upper Trap Stretch - 2 x daily - 7 x weekly - 1 sets - 3 reps - 10 sec hold  Seated Cervical Sidebending AROM - 2 x daily - 7 x weekly - 1 sets - 5 reps - 5-10 sec hold

## 2020-12-11 ENCOUNTER — Ambulatory Visit (INDEPENDENT_AMBULATORY_CARE_PROVIDER_SITE_OTHER): Payer: 59 | Admitting: Rehabilitative and Restorative Service Providers"

## 2020-12-11 ENCOUNTER — Other Ambulatory Visit (HOSPITAL_COMMUNITY): Payer: Self-pay

## 2020-12-11 ENCOUNTER — Other Ambulatory Visit: Payer: Self-pay

## 2020-12-11 ENCOUNTER — Encounter: Payer: Self-pay | Admitting: Rehabilitative and Restorative Service Providers"

## 2020-12-11 DIAGNOSIS — M25512 Pain in left shoulder: Secondary | ICD-10-CM | POA: Diagnosis not present

## 2020-12-11 DIAGNOSIS — R29898 Other symptoms and signs involving the musculoskeletal system: Secondary | ICD-10-CM | POA: Diagnosis not present

## 2020-12-11 DIAGNOSIS — M6281 Muscle weakness (generalized): Secondary | ICD-10-CM

## 2020-12-11 DIAGNOSIS — G8929 Other chronic pain: Secondary | ICD-10-CM | POA: Diagnosis not present

## 2020-12-11 NOTE — Therapy (Signed)
[place], [place], [address] Phone: [phone]   Fax:  [phone]  Physical Therapy Treatment  Patient Details  Name: Kathy Clark MRN: [id number] Date of Birth: 01/13/1961 Referring Provider (PT): chandler, justin   Encounter Date: 12/11/2020   PT End of Session - 12/11/20 1103    Visit Number 18    Number of Visits 30    Date for PT Re-Evaluation 01/22/21    PT Start Time 1102    PT Stop Time 1155    PT Time Calculation (min) 53 min    Activity Tolerance Patient tolerated treatment well           Past Medical History:  Diagnosis Date  . Anxiety   . Back pain   . 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 Depression   . Essential hypertension, benign   . Gastritis   . GERD (gastroesophageal reflux disease)   . Joint pain   . Lymphocytic colitis    microscopic  . Migraines   . Osteoarthritis   . Sciatica    right leg  . Swelling    feet, left foot    Past Surgical History:  Procedure Laterality Date  . ABDOMINAL HYSTERECTOMY  07-2005  . [place] Bunionectomy Left 09/18/2016   Left Foot  . BACK SURGERY    . KNEE SURGERY  2006   left; multiple  . microdiscetomy  2005  . SHOULDER SURGERY  07-22-09   right  . SKIN CANCER EXCISION     eyelid  . TOTAL KNEE ARTHROPLASTY  07/27/2011   Procedure: TOTAL KNEE ARTHROPLASTY;  Surgeon: Gearlean Alf;  Location: [facility];  Service: Orthopedics;  Laterality: Left;    There were no vitals filed for this visit.   Subjective Assessment - 12/11/20 1104    Subjective Patient reports that she has noted improvement in Lt shoulder. She is having less pain and tightness. She has less muscle spasms and tightness in the neck. Using the sling at times when she needs to support the shoulder in sitting.    Currently in Pain? Yes    Pain Score 2     Pain Location Shoulder    Pain Orientation Left    Pain Descriptors / Indicators  Aching;Sore    Pain Type Chronic pain;Surgical pain    Pain Radiating Towards tightness in Lt neck area    Pain Onset More than a month ago    Pain Frequency Intermittent    Effect of Pain on Daily Activities difficult to wash hair and put jacket on              [facility] PT Assessment - 12/11/20 0001      Assessment   Medical Diagnosis left frozen shoulder    Referring Provider (PT) chandler, justin    Onset Date/Surgical Date 03/13/20    Hand Dominance Right    Next MD Visit 12/13/20      AROM   Left Shoulder Extension 59 Degrees    Left Shoulder Flexion 147 Degrees    Left Shoulder ABduction 152 Degrees    Left Shoulder Internal Rotation --   thumb to T10   Left Shoulder External Rotation 90 Degrees   shoulder 90 deg abd     Palpation   Palpation comment muscular tightness Lt shoulder girdle through pecs; upper trap; leveator; teres; lats  [facility] Adult PT Treatment/Exercise - 12/11/20 0001      Shoulder Exercises: Standing   Other Standing Exercises scap squeeze with noodle 10 sec x 10 reps; L's x 10; W's x 10      Shoulder Exercises: Pulleys   Flexion 2 minutes    Scaption 1 minute    Other Pulley Exercises horizontal ab/ad hands level moving hands in and out      Shoulder Exercises: Therapy Ball   Other Therapy Ball Exercises scapular depression pressing into the ball added lateral cervical flexion to stretch neck 10-15 sec hold x 4 reps    Other Therapy Ball Exercises working with 8 inch ball on shoulder ROM and gentle strengthening using both hands on ball - flexion, extension, diagonals, moving overhead, body on ball ~ 10 reps each. Also worked with ball on wall shoulder height wt bearing activities circles CW/CCW, flexion/ext; horizontal ab/ad x ~ 1 min      Shoulder Exercises: Isometric Strengthening   Extension 5X5"    External Rotation 5X5"    Internal Rotation 5X5"    ABduction 5X5"      Shoulder Exercises: Stretch    External Rotation Stretch 5 reps;10 seconds    Table Stretch - Flexion 5 reps;10 seconds;20 seconds    Other Shoulder Stretches prolonged snow angel ~ 2 min UE's at ~ 70 to 80 deg abd      Moist Heat Therapy   Number Minutes Moist Heat 15 Minutes    Moist Heat Location Shoulder;Cervical      Manual Therapy   Soft tissue mobilization STM Lt pecs; upper trap, levator, biceps, deltoid    Passive ROM Lt shoulder flex; ER, IR, abduction in scapular plane; extension and horizontal abduction    Manual Traction through long arm at side 10-20 sec pull x 3-4 during treatment                       PT Long Term Goals - 12/11/20 1111      PT LONG TERM GOAL #1   Title Pt will be independent with HEP    Time 6    Period Weeks    Status On-going    Target Date 01/22/21      PT LONG TERM GOAL #2   Title Pt will improve FOTO to >= 65 to demo functional improvement    Time 6    Status On-going    Target Date 01/22/21      PT LONG TERM GOAL #3   Title Pt will improve Lt UE strength to 4/5 to perform IADLs with decreased pain    Baseline -    Time 6    Period Weeks    Status On-going    Target Date 01/22/21      PT LONG TERM GOAL #4   Title Pt will improve Lt shoulder ROM to within 10 degrees of Rt shoulder to perform ADLs with decreased pain    Time 6    Period Weeks    Status On-going    Target Date 01/22/21      PT LONG TERM GOAL #5   Title -                 Plan - 12/11/20 1119    Clinical Impression Statement Gradual improvement following flare up of symptoms. Less pain today. Good gains in AROM, PROM with decreased palpable tightness through the Lt shoulder girdle. Progressing toward stated goals of therapy.  Rehab Potential Good    PT Frequency 2x / week    PT Duration 6 weeks    PT Treatment/Interventions ADLs/Self Care Home Management;Cryotherapy;Moist Heat;Iontophoresis 4mg /ml Dexamethasone;Electrical Stimulation;Functional mobility  training;Neuromuscular re-education;Therapeutic exercise;Therapeutic activities;Patient/family education;Manual techniques;Passive range of motion;Dry needling;Taping;Vasopneumatic Device    PT Next Visit Plan continue with mangement of pain and PROM, gentle AROM; gradually add active exerise gradually progressing to strengthening as tolerated - needs work for posterior shoulder girdle strengthening as tolerated    PT Home Exercise Plan [email or web address]    Consulted and Agree with Plan of Care Patient           Patient will benefit from skilled therapeutic intervention in order to improve the following deficits and impairments:     Visit Diagnosis: Chronic left shoulder pain  Muscle weakness (generalized)  Other symptoms and signs involving the musculoskeletal system     Problem List Patient Active Problem List   Diagnosis Date Noted  . Indication present for endocarditis prophylaxis 05/07/2020  . PVC (premature ventricular contraction) 05/07/2020  . Major depressive disorder with single episode, in partial remission ([place]) 05/07/2020  . Bursitis of left shoulder 02/08/2020  . Annual physical exam 01/10/2020  . Sleeping difficulties 01/10/2020  . Rheumatoid arthritis of multiple sites with negative rheumatoid factor ([place]) 12/04/2019  . Polyarthralgia 11/02/2019  . Myofascial pain 11/02/2019  . Arthralgia of both hands 10/03/2019  . Metatarsalgia of left foot 01/05/2018  . Other hyperlipidemia 03/23/2017  . OSA (obstructive sleep apnea) 02/25/2017  . Class 1 obesity without serious comorbidity with body mass index (BMI) of 34.0 to 34.9 in adult 08/17/2016  . Elevated liver function tests 08/17/2016  . Vitamin D deficiency 08/03/2016  . Left tennis elbow 04/06/2016  . 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 Depression 08/23/2015  . Subacromial bursitis 04/23/2015  . Left carpal tunnel syndrome, post right carpal tunnel release 12/24/2014  . Trochanteric bursitis of right hip 04/21/2013  .  Postop Mild Hyponatremia 07/30/2011  . OSTEOARTHRITIS, KNEE, LEFT 12/12/2009  . MIGRAINE WITH AURA 05/31/2008  . SKIN CANCER, HX OF 01/06/2008  . COLITIS, HX OF 01/06/2008  . ALLERGIC RHINITIS 05/20/2007  . HYPERTENSION, BENIGN ESSENTIAL 11/23/2006  . SCIATICA, CHRONIC 05/19/2006    Krysti Hickling Nilda Simmer PT, MPH  12/11/2020, 12:53 PM  [facility] [place], [place], [address] Phone: [phone]   Fax:  [phone]  Name: VASSIE KUGEL MRN: [id number] Date of Birth: 08/27/1960

## 2020-12-13 ENCOUNTER — Encounter: Payer: 59 | Admitting: Physical Therapy

## 2020-12-17 ENCOUNTER — Other Ambulatory Visit: Payer: Self-pay

## 2020-12-17 ENCOUNTER — Ambulatory Visit (INDEPENDENT_AMBULATORY_CARE_PROVIDER_SITE_OTHER): Payer: 59 | Admitting: Rehabilitative and Restorative Service Providers"

## 2020-12-17 ENCOUNTER — Encounter: Payer: Self-pay | Admitting: Rehabilitative and Restorative Service Providers"

## 2020-12-17 DIAGNOSIS — G8929 Other chronic pain: Secondary | ICD-10-CM

## 2020-12-17 DIAGNOSIS — R29898 Other symptoms and signs involving the musculoskeletal system: Secondary | ICD-10-CM

## 2020-12-17 DIAGNOSIS — M25512 Pain in left shoulder: Secondary | ICD-10-CM

## 2020-12-17 DIAGNOSIS — M6281 Muscle weakness (generalized): Secondary | ICD-10-CM | POA: Diagnosis not present

## 2020-12-17 NOTE — Therapy (Signed)
[place], [place], [address] Phone: [phone]   Fax:  [phone]  Physical Therapy Treatment  Patient Details  Name: Kathy Clark MRN: [id number] Date of Birth: 1961-06-12 Referring Provider (PT): chandler, justin   Encounter Date: 12/17/2020   PT End of Session - 12/17/20 1101    Visit Number 19    Number of Visits 30    Date for PT Re-Evaluation 01/22/21    PT Start Time 1100    PT Stop Time 1157   MH end of treatment   PT Time Calculation (min) 57 min    Activity Tolerance Patient tolerated treatment well           Past Medical History:  Diagnosis Date  . Anxiety   . Back pain   . 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 Depression   . Essential hypertension, benign   . Gastritis   . GERD (gastroesophageal reflux disease)   . Joint pain   . Lymphocytic colitis    microscopic  . Migraines   . Osteoarthritis   . Sciatica    right leg  . Swelling    feet, left foot    Past Surgical History:  Procedure Laterality Date  . ABDOMINAL HYSTERECTOMY  07-2005  . [place] Bunionectomy Left 09/18/2016   Left Foot  . BACK SURGERY    . KNEE SURGERY  2006   left; multiple  . microdiscetomy  2005  . SHOULDER SURGERY  07-22-09   right  . SKIN CANCER EXCISION     eyelid  . TOTAL KNEE ARTHROPLASTY  07/27/2011   Procedure: TOTAL KNEE ARTHROPLASTY;  Surgeon: Gearlean Alf;  Location: [facility];  Service: Orthopedics;  Laterality: Left;    There were no vitals filed for this visit.   Subjective Assessment - 12/17/20 1102    Subjective Patient reports that she has some continued pain. She saw the MD Friday. MD is please with ROM. She has been doing her exercises and has started a little with bit of strengthening which has gone OK.    Currently in Pain? Yes    Pain Score 2     Pain Location Shoulder    Pain Orientation Left    Pain Descriptors / Indicators Aching;Sore     Pain Type Chronic pain;Surgical pain    Pain Onset More than a month ago    Pain Frequency Intermittent              [facility] PT Assessment - 12/17/20 0001      Assessment   Medical Diagnosis left frozen shoulder s/p I & D    Referring Provider (PT) chandler, justin    Onset Date/Surgical Date 03/13/20    Hand Dominance Right    Next MD Visit 12/13/20      PROM   Left Shoulder Flexion 165 Degrees    Left Shoulder ABduction 164 Degrees   in scapular plane   Left Shoulder External Rotation 94 Degrees   in scapular plane     Palpation   Palpation comment muscular tightness Lt lateral cervical musculature and shoulder girdle through pecs; upper trap; leveator; teres; lats                         [facility] Adult PT Treatment/Exercise - 12/17/20 0001      Shoulder Exercises: Sidelying   External Rotation AROM;Strengthening;Left;10 reps    External  Rotation Weight (lbs) 0    External Rotation Limitations AROM to pt tolerance      Shoulder Exercises: Standing   External Rotation Strengthening;Left    External Rotation Limitations ball on wall at dorsum of hand circles CW/CCW ~ 1 min x 2 reps - patient turned btn back and side to wall    Flexion AROM;Strengthening;10 reps   2-3 sec pause   Shoulder Flexion Weight (lbs) 0    Flexion Limitations AROM to ~ 90 deg; slight knee flexion to decreased LBP    ABduction AROM;Strengthening;Both;10 reps   2-3 sec pause   Shoulder ABduction Weight (lbs) 0    ABduction Limitations active ROM to ~ 90 deg    Extension Strengthening;Both;10 reps;Theraband    Theraband Level (Shoulder Extension) Level 1 (Yellow)    Row Strengthening;Both;10 reps;Theraband    Theraband Level (Shoulder Row) Level 1 (Yellow)    Retraction Strengthening;Both;10 reps;Theraband   5 sec hold   Theraband Level (Shoulder Retraction) Level 1 (Yellow)    Other Standing Exercises scap squeeze with noodle 10 sec x 10 reps; L's x 10; W's x 10      Shoulder  Exercises: Pulleys   Flexion 2 minutes    Scaption 1 minute    Other Pulley Exercises horizontal ab/ad hands level moving hands in and out      Shoulder Exercises: Stretch   External Rotation Stretch 5 reps;10 seconds    Table Stretch - Flexion 5 reps;10 seconds;20 seconds    Other Shoulder Stretches prolonged snow angel ~ 2 min UE's at ~ 70 to 80 deg abd      Moist Heat Therapy   Number Minutes Moist Heat 15 Minutes    Moist Heat Location Shoulder;Cervical      Manual Therapy   Manual therapy comments skilled palpation to assess response to DN/manual work    Soft tissue mobilization STM Lt cervical; upper trap; leveator; pecs; teres; biceps, deltoid    Passive ROM Lt shoulder flex; ER, IR, abduction in scapular plane; extension and horizontal abduction    Manual Traction through long arm at side 10-20 sec pull x 3-4 during treatment            Trigger Point Dry Needling - 12/17/20 0001    Consent Given? Yes    Education Handout Provided Previously provided    Other Dry Needling Lt    Upper Trapezius Response Palpable increased muscle length    Scalenes Response Palpable increased muscle length    Deltoid Response Palpable increased muscle length    Teres major Response Palpable increased muscle length    Teres minor Response Palpable increased muscle length                     PT Long Term Goals - 12/11/20 1111      PT LONG TERM GOAL #1   Title Pt will be independent with HEP    Time 6    Period Weeks    Status On-going    Target Date 01/22/21      PT LONG TERM GOAL #2   Title Pt will improve FOTO to >= 65 to demo functional improvement    Time 6    Status On-going    Target Date 01/22/21      PT LONG TERM GOAL #3   Title Pt will improve Lt UE strength to 4/5 to perform IADLs with decreased pain    Baseline -    Time 6  Period Weeks    Status On-going    Target Date 01/22/21      PT LONG TERM GOAL #4   Title Pt will improve Lt shoulder ROM to  within 10 degrees of Rt shoulder to perform ADLs with decreased pain    Time 6    Period Weeks    Status On-going    Target Date 01/22/21      PT LONG TERM GOAL #5   Title -                 Plan - 12/17/20 1202    Clinical Impression Statement Continued intermittent pain in the Lt shoulder. Difficulty progressing with strengthening exercises due to discomfort. Good response to DN and manual work. PROM continues to be good. Patient tolerated AROM exercises and yellow TB exercises today. Progressing gradually toward stated goals. Will gradually progress strengthening and continue working on scapular stability and posture/alignment.    Rehab Potential Good    PT Frequency 2x / week    PT Duration 6 weeks    PT Treatment/Interventions ADLs/Self Care Home Management;Cryotherapy;Moist Heat;Iontophoresis 4mg /ml Dexamethasone;Electrical Stimulation;Functional mobility training;Neuromuscular re-education;Therapeutic exercise;Therapeutic activities;Patient/family education;Manual techniques;Passive range of motion;Dry needling;Taping;Vasopneumatic Device    PT Next Visit Plan continue with mangement of pain and PROM, gentle AROM; gradually add active and resistive exercise gradually as tolerated - needs work for posterior shoulder girdle strengthening. DN and manual work through [employer] cervical and shoulder girdle areas; modalities as indicated    PT Home Exercise Plan [phone]    Consulted and Agree with Plan of Care Patient           Patient will benefit from skilled therapeutic intervention in order to improve the following deficits and impairments:     Visit Diagnosis: Chronic left shoulder pain  Muscle weakness (generalized)  Other symptoms and signs involving the musculoskeletal system     Problem List Patient Active Problem List   Diagnosis Date Noted  . Indication present for endocarditis prophylaxis 05/07/2020  . PVC (premature ventricular contraction) 05/07/2020  . Major  depressive disorder with single episode, in partial remission ([place]) 05/07/2020  . Bursitis of left shoulder 02/08/2020  . Annual physical exam 01/10/2020  . Sleeping difficulties 01/10/2020  . Rheumatoid arthritis of multiple sites with negative rheumatoid factor ([place]) 12/04/2019  . Polyarthralgia 11/02/2019  . Myofascial pain 11/02/2019  . Arthralgia of both hands 10/03/2019  . Metatarsalgia of left foot 01/05/2018  . Other hyperlipidemia 03/23/2017  . OSA (obstructive sleep apnea) 02/25/2017  . Class 1 obesity without serious comorbidity with body mass index (BMI) of 34.0 to 34.9 in adult 08/17/2016  . Elevated liver function tests 08/17/2016  . Vitamin D deficiency 08/03/2016  . Left tennis elbow 04/06/2016  . 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 Depression 08/23/2015  . Subacromial bursitis 04/23/2015  . Left carpal tunnel syndrome, post right carpal tunnel release 12/24/2014  . Trochanteric bursitis of right hip 04/21/2013  . Postop Mild Hyponatremia 07/30/2011  . OSTEOARTHRITIS, KNEE, LEFT 12/12/2009  . MIGRAINE WITH AURA 05/31/2008  . SKIN CANCER, HX OF 01/06/2008  . COLITIS, HX OF 01/06/2008  . ALLERGIC RHINITIS 05/20/2007  . HYPERTENSION, BENIGN ESSENTIAL 11/23/2006  . SCIATICA, CHRONIC 05/19/2006    Isabellarose Kope Nilda Simmer PT, MPH  12/17/2020, 12:17 PM  [facility] [place], [place], [address] Phone: [phone]   Fax:  [phone]  Name: JAYDAH STAHLE MRN: [id number] Date of Birth: [birth year]

## 2020-12-19 ENCOUNTER — Ambulatory Visit (INDEPENDENT_AMBULATORY_CARE_PROVIDER_SITE_OTHER): Payer: 59 | Admitting: Rehabilitative and Restorative Service Providers"

## 2020-12-19 ENCOUNTER — Other Ambulatory Visit: Payer: Self-pay

## 2020-12-19 ENCOUNTER — Encounter: Payer: Self-pay | Admitting: Rehabilitative and Restorative Service Providers"

## 2020-12-19 DIAGNOSIS — R29898 Other symptoms and signs involving the musculoskeletal system: Secondary | ICD-10-CM | POA: Diagnosis not present

## 2020-12-19 DIAGNOSIS — M25512 Pain in left shoulder: Secondary | ICD-10-CM

## 2020-12-19 DIAGNOSIS — G8929 Other chronic pain: Secondary | ICD-10-CM | POA: Diagnosis not present

## 2020-12-19 DIAGNOSIS — M6281 Muscle weakness (generalized): Secondary | ICD-10-CM | POA: Diagnosis not present

## 2020-12-19 NOTE — Therapy (Signed)
[place], [place], [address] Phone: [phone]   Fax:  [phone]  Physical Therapy Treatment  Patient Details  Name: Kathy Clark MRN: [id number] Date of Birth: [birth year] Referring Provider (PT): chandler, justin   Encounter Date: 12/19/2020   PT End of Session - 12/19/20 1105     Visit Number 20    Number of Visits 30    Date for PT Re-Evaluation 01/22/21    PT Start Time 2992    PT Stop Time 4268    PT Time Calculation (min) 53 min    Activity Tolerance Patient tolerated treatment well             Past Medical History:  Diagnosis Date   Anxiety    Back pain    Complication of anesthesia    allergy to Succinylcholine   Depression    Essential hypertension, benign    Gastritis    GERD (gastroesophageal reflux disease)    Joint pain    Lymphocytic colitis    microscopic   Migraines    Osteoarthritis    Sciatica    right leg   Swelling    feet, left foot    Past Surgical History:  Procedure Laterality Date   ABDOMINAL HYSTERECTOMY  07-2005   [place] Bunionectomy Left 09/18/2016   Left Foot   BACK SURGERY     KNEE SURGERY  2006   left; multiple   microdiscetomy  2005   SHOULDER SURGERY  07-22-09   right   SKIN CANCER EXCISION     eyelid   TOTAL KNEE ARTHROPLASTY  07/27/2011   Procedure: TOTAL KNEE ARTHROPLASTY;  Surgeon: Gearlean Alf;  Location: [facility];  Service: Orthopedics;  Laterality: Left;    There were no vitals filed for this visit.   Subjective Assessment - 12/19/20 1105     Subjective Patient reports that she did well with DN and slept all night that night. She had minimal pain yesterday until she drove and then had some pain. Overall improving. Able to add some theraband exercises today.    Currently in Pain? Yes    Pain Score 2     Pain Location Shoulder    Pain Orientation Left    Pain Descriptors / Indicators Aching;Sore    Pain Type Chronic  pain;Surgical pain                               [facility] Adult PT Treatment/Exercise - 12/19/20 0001       Shoulder Exercises: Standing   External Rotation Strengthening;Left    Extension Strengthening;Both;10 reps;Theraband    Theraband Level (Shoulder Extension) Level 1 (Yellow)    Row Strengthening;Both;10 reps;Theraband    Theraband Level (Shoulder Row) Level 1 (Yellow)    Row Limitations trial of bow and arrow - pain Rt    Retraction Strengthening;Both;10 reps;Theraband   5 sec hold   Theraband Level (Shoulder Retraction) Level 1 (Yellow)    Other Standing Exercises scap squeeze with noodle 10 sec x 10 reps; L's x 10; W's x 10      Shoulder Exercises: Pulleys   Flexion 2 minutes    Scaption 1 minute    Other Pulley Exercises horizontal ab/ad hands level moving hands in and out      Shoulder Exercises: Stretch   Internal Rotation Stretch 3 reps   with strap   External Rotation Stretch  5 reps;10 seconds    Wall Stretch - Flexion 5 reps;10 seconds   bilat   Other Shoulder Stretches prolonged snow angel ~ 2 min UE's at ~ 70 to 80 deg abd      Moist Heat Therapy   Number Minutes Moist Heat 15 Minutes    Moist Heat Location Shoulder;Cervical      Manual Therapy   Manual therapy comments skilled palpation to assess response to DN/manual work    Soft tissue mobilization STM Lt cervical; upper trap; leveator; pecs; teres; biceps, deltoid    Passive ROM Lt shoulder flex; ER, IR, abduction in scapular plane; extension and horizontal abduction    Manual Traction through long arm at side 10-20 sec pull x 3-4 during treatment              Trigger Point Dry Needling - 12/19/20 0001     Consent Given? Yes    Education Handout Provided Previously provided    Other Dry Needling Lt    Upper Trapezius Response Palpable increased muscle length    Scalenes Response Palpable increased muscle length    Deltoid Response Palpable increased muscle length    Teres  major Response Palpable increased muscle length    Teres minor Response Palpable increased muscle length                  PT Education - 12/19/20 1147     Education Details HEP    Person(s) Educated Patient    Methods Explanation;Demonstration;Tactile cues;Verbal cues;Handout    Comprehension Verbalized understanding;Returned demonstration;Verbal cues required;Tactile cues required                 PT Long Term Goals - 12/11/20 1111       PT LONG TERM GOAL #1   Title Pt will be independent with HEP    Time 6    Period Weeks    Status On-going    Target Date 01/22/21      PT LONG TERM GOAL #2   Title Pt will improve FOTO to >= 65 to demo functional improvement    Time 6    Status On-going    Target Date 01/22/21      PT LONG TERM GOAL #3   Title Pt will improve Lt UE strength to 4/5 to perform IADLs with decreased pain    Baseline -    Time 6    Period Weeks    Status On-going    Target Date 01/22/21      PT LONG TERM GOAL #4   Title Pt will improve Lt shoulder ROM to within 10 degrees of Rt shoulder to perform ADLs with decreased pain    Time 6    Period Weeks    Status On-going    Target Date 01/22/21      PT LONG TERM GOAL #5   Title -                   Plan - 12/19/20 1108     Clinical Impression Statement Improving. Good response to DN and manual wor at last visit. Patient added strengthening exercises with therabands. Progressing with decreased pain and increased tolerance for resistive exercises.    Rehab Potential Good    PT Frequency 2x / week    PT Duration 6 weeks    PT Treatment/Interventions ADLs/Self Care Home Management;Cryotherapy;Moist Heat;Iontophoresis 4mg /ml Dexamethasone;Electrical Stimulation;Functional mobility training;Neuromuscular re-education;Therapeutic exercise;Therapeutic activities;Patient/family education;Manual techniques;Passive range of motion;Dry needling;Taping;Vasopneumatic Device  PT Next Visit  Plan continue with mangement of pain and PROM, gentle AROM; gradually add active and resistive exercise gradually as tolerated - needs work for posterior shoulder girdle strengthening. DN and manual work through [employer] cervical and shoulder girdle areas; modalities as indicated    PT Home Exercise Plan [phone]    Consulted and Agree with Plan of Care Patient             Patient will benefit from skilled therapeutic intervention in order to improve the following deficits and impairments:     Visit Diagnosis: Chronic left shoulder pain  Muscle weakness (generalized)  Other symptoms and signs involving the musculoskeletal system     Problem List Patient Active Problem List   Diagnosis Date Noted   Indication present for endocarditis prophylaxis 05/07/2020   PVC (premature ventricular contraction) 05/07/2020   Major depressive disorder with single episode, in partial remission ([place]) 05/07/2020   Bursitis of left shoulder 02/08/2020   Annual physical exam 01/10/2020   Sleeping difficulties 01/10/2020   Rheumatoid arthritis of multiple sites with negative rheumatoid factor ([place]) 12/04/2019   Polyarthralgia 11/02/2019   Myofascial pain 11/02/2019   Arthralgia of both hands 10/03/2019   Metatarsalgia of left foot 01/05/2018   Other hyperlipidemia 03/23/2017   OSA (obstructive sleep apnea) 02/25/2017   Class 1 obesity without serious comorbidity with body mass index (BMI) of 34.0 to 34.9 in adult 08/17/2016   Elevated liver function tests 08/17/2016   Vitamin D deficiency 08/03/2016   Left tennis elbow 04/06/2016   Anxious depression 02/20/2016   Depression 08/23/2015   Subacromial bursitis 04/23/2015   Left carpal tunnel syndrome, post right carpal tunnel release 12/24/2014   Trochanteric bursitis of right hip 04/21/2013   Postop Mild Hyponatremia 07/30/2011   OSTEOARTHRITIS, KNEE, LEFT 12/12/2009   MIGRAINE WITH AURA 05/31/2008   SKIN CANCER, HX OF 01/06/2008   COLITIS, HX OF  01/06/2008   ALLERGIC RHINITIS 05/20/2007   HYPERTENSION, BENIGN ESSENTIAL 11/23/2006   SCIATICA, CHRONIC 05/19/2006    Kathy Clark Kathy Clark PT, MPH  12/19/2020, 11:55 AM  [facility] [place], [place], [address] Phone: [phone]   Fax:  [phone]  Name: Kathy Clark MRN: [id number] Date of Birth: April 19, 1961

## 2020-12-19 NOTE — Patient Instructions (Signed)
Access Code: [email or web address]: [email or web address]: 06/09/2022Prepared by: Yaritzel Stange HoltExercises  Circular Shoulder Pendulum with Table Support - 3-4 x daily - 7 x weekly - 1 sets - 20-30 reps  Standing Shoulder Extension with Dowel - 2 x daily - 7 x weekly - 1 sets - 5-10 reps - 5-10 sec hold  Standing Shoulder and Trunk Flexion at Table - 2 x daily - 7 x weekly - 1 sets - 3 reps - 10 sec hold  Seated Shoulder Flexion AAROM with Pulley Behind - 2 x daily - 7 x weekly - 1 sets - 10 reps - 10 sec hold  Seated Shoulder Scaption AAROM with Pulley at Side - 2 x daily - 7 x weekly - 1 sets - 10 reps - 10sec hold  Shoulder External Rotation and Scapular Retraction - 2 x daily - 7 x weekly - 1 sets - 10 reps - 5 sec hold  Shoulder External Rotation in 45 Degrees Abduction - 2 x daily - 7 x weekly - 1-2 sets - 10 reps - 3 sec hold  Standing Shoulder Internal Rotation Stretch with Towel - 2 x daily - 7 x weekly - 1 sets - 3-5 reps - 10-15 hold  Seated Shoulder W - 2 x daily - 7 x weekly - 1 sets - 10 reps - 3 sec hold  Standing Shoulder External Rotation with Resistance - 2 x daily - 7 x weekly - 1-3 sets - 10 reps - 2-3 sec hold  Standing Bilateral Low Shoulder Row with Anchored Resistance - 2 x daily - 7 x weekly - 1-3 sets - 10 reps - 2-3 sec hold  Shoulder Extension with Resistance - 2 x daily - 7 x weekly - 1-3 sets - 10 reps - 2-3 sec hold  Bilateral Scapular Depression with Anchored Resistance - Straight Arm - 2 x daily - 7 x weekly - 2 sets - 10 reps - 3-5 sec hold  Corner Pec Major Stretch - 2 x daily - 7 x weekly - 1 sets - 3 reps - 30 sec hold  Seated Upper Trap Stretch - 2 x daily - 7 x weekly - 1 sets - 3 reps - 10 sec hold  Seated Cervical Sidebending AROM - 2 x daily - 7 x weekly - 1 sets - 5 reps - 5-10 sec hold  Standing Shoulder Internal Rotation Stretch with Towel - 2 x daily - 7 x weekly - 1 sets - 3-5 reps - 15-20 sec hold  Standing shoulder flexion wall slides - 2 x daily  - 7 x weekly - 1 sets - 5 reps - 3-5 sec hold  Sidelying Shoulder External Rotation - 2 x daily - 7 x weekly - 1 sets - 10 reps - 3 sec hold

## 2020-12-24 ENCOUNTER — Ambulatory Visit (INDEPENDENT_AMBULATORY_CARE_PROVIDER_SITE_OTHER): Payer: 59 | Admitting: Rehabilitative and Restorative Service Providers"

## 2020-12-24 ENCOUNTER — Other Ambulatory Visit: Payer: Self-pay

## 2020-12-24 ENCOUNTER — Encounter: Payer: Self-pay | Admitting: Rehabilitative and Restorative Service Providers"

## 2020-12-24 DIAGNOSIS — G8929 Other chronic pain: Secondary | ICD-10-CM

## 2020-12-24 DIAGNOSIS — R29898 Other symptoms and signs involving the musculoskeletal system: Secondary | ICD-10-CM

## 2020-12-24 DIAGNOSIS — M25512 Pain in left shoulder: Secondary | ICD-10-CM

## 2020-12-24 DIAGNOSIS — M6281 Muscle weakness (generalized): Secondary | ICD-10-CM | POA: Diagnosis not present

## 2020-12-24 NOTE — Patient Instructions (Signed)
Access Code: [email or web address]: [email or web address]: 06/14/2022Prepared by: Orine Goga HoltExercises  Shoulder External Rotation and Scapular Retraction - 2 x daily - 7 x weekly - 1 sets - 10 reps - 5 sec hold  Seated Shoulder W - 2 x daily - 7 x weekly - 1 sets - 10 reps - 3 sec hold  Standing Shoulder External Rotation with Resistance - 2 x daily - 7 x weekly - 1-3 sets - 10 reps - 2-3 sec hold  Standing Bilateral Low Shoulder Row with Anchored Resistance - 2 x daily - 7 x weekly - 1-3 sets - 10 reps - 2-3 sec hold  Shoulder Extension with Resistance - 2 x daily - 7 x weekly - 1-3 sets - 10 reps - 2-3 sec hold  Bilateral Scapular Depression with Anchored Resistance - Straight Arm - 2 x daily - 7 x weekly - 2 sets - 10 reps - 3-5 sec hold  Seated Upper Trap Stretch - 2 x daily - 7 x weekly - 1 sets - 3 reps - 10 sec hold  Standing Shoulder Internal Rotation Stretch with Towel - 2 x daily - 7 x weekly - 1 sets - 3-5 reps - 15-20 sec hold  Sidelying Shoulder External Rotation - 2 x daily - 7 x weekly - 1 sets - 10 reps - 3 sec hold  Seated Shoulder Press with Resistance - 1 x daily - 7 x weekly - 2-3 sets - 10 reps - 3 sec hold  Bent Over Single Arm Shoulder Row with Dumbbell - 1 x daily - 7 x weekly - 1 sets - 10 reps - 3 sec hold  Standing Bent Over Triceps Extension - 1 x daily - 7 x weekly - 1-2 sets - 10 reps - 2-3 sec hold  Shoulder Flexion Serratus Activation with Resistance - 1 x daily - 7 x weekly - 1 sets - 10 reps - 3-5 sec hold  Doorway Pec Stretch at 60 Degrees Abduction - 3 x daily - 7 x weekly - 3 reps - 1 sets  Doorway Pec Stretch at 90 Degrees Abduction - 3 x daily - 7 x weekly - 3 reps - 1 sets - 30 seconds hold  Doorway Pec Stretch at 120 Degrees Abduction - 3 x daily - 7 x weekly - 3 reps - 1 sets - 30 second hold hold

## 2020-12-24 NOTE — Therapy (Signed)
[place], [place], [address] Phone: [phone]   Fax:  [phone]  Physical Therapy Treatment  Patient Details  Name: Kathy Clark MRN: [id number] Date of Birth: 12-28-1960 Referring Provider (PT): chandler, justin   Encounter Date: 12/24/2020   PT End of Session - 12/24/20 1105     Visit Number 21    Number of Visits 30    Date for PT Re-Evaluation 01/22/21    PT Start Time 1104    PT Stop Time 1152    PT Time Calculation (min) 48 min    Activity Tolerance Patient tolerated treatment well             Past Medical History:  Diagnosis Date   Anxiety    Back pain    Complication of anesthesia    allergy to Succinylcholine   Depression    Essential hypertension, benign    Gastritis    GERD (gastroesophageal reflux disease)    Joint pain    Lymphocytic colitis    microscopic   Migraines    Osteoarthritis    Sciatica    right leg   Swelling    feet, left foot    Past Surgical History:  Procedure Laterality Date   ABDOMINAL HYSTERECTOMY  07-2005   [place] Bunionectomy Left 09/18/2016   Left Foot   BACK SURGERY     KNEE SURGERY  2006   left; multiple   microdiscetomy  2005   SHOULDER SURGERY  07-22-09   right   SKIN CANCER EXCISION     eyelid   TOTAL KNEE ARTHROPLASTY  07/27/2011   Procedure: TOTAL KNEE ARTHROPLASTY;  Surgeon: Gearlean Alf;  Location: [facility];  Service: Orthopedics;  Laterality: Left;    There were no vitals filed for this visit.   Subjective Assessment - 12/24/20 1109     Subjective Improving. Much less and discomfort. Has increased activity level. Played keyboard Saturday night 3 hours and then again for church yesterday. DN helping    Currently in Pain? Yes    Pain Score 1     Pain Location Shoulder    Pain Orientation Left    Pain Descriptors / Indicators Aching;Sore    Pain Type Chronic pain;Surgical pain                [facility] PT  Assessment - 12/24/20 0001       Assessment   Medical Diagnosis left frozen shoulder s/p I & D    Referring Provider (PT) chandler, justin    Onset Date/Surgical Date 03/13/20    Hand Dominance Right      Palpation   Palpation comment muscular tightness Lt lateral cervical musculature and shoulder girdle through pecs; upper trap; leveator; teres; lats                           [facility] Adult PT Treatment/Exercise - 12/24/20 0001       Shoulder Exercises: Seated   Other Seated Exercises shoulder rolls backwards x 10    Other Seated Exercises row; ER; overhead press x 10 x 2 sets no resistance pain with trial with yellow TB for ER      Shoulder Exercises: Prone   Other Prone Exercises bent row 3# wt x 10 reps    Other Prone Exercises bent row triceps kick back 3# x 10 reps      Shoulder Exercises: Standing  External Rotation Strengthening;Left    External Rotation Limitations ball on wall at dorsum of hand circles CW/CCW ~ 1 min x 2 reps - patient turned btn back and side to wall    Extension Strengthening;Both;10 reps;Theraband    Theraband Level (Shoulder Extension) Level 2 (Red)    Row Strengthening;Both;10 reps;Theraband    Theraband Level (Shoulder Row) Level 2 (Red)    Retraction Strengthening;Both;10 reps;Theraband   5 sec hold   Theraband Level (Shoulder Retraction) Level 1 (Yellow)    Other Standing Exercises scap squeeze with noodle 10 sec x 10 reps; L's x 10; W's x 10      Shoulder Exercises: Pulleys   Flexion 2 minutes    Scaption 1 minute    Other Pulley Exercises horizontal ab/ad hands level moving hands in and out      Moist Heat Therapy   Number Minutes Moist Heat 10 Minutes    Moist Heat Location Shoulder;Cervical      Manual Therapy   Manual therapy comments skilled palpation to assess response to DN/manual work    Soft tissue mobilization STM Lt cervical; upper trap/mid trap; leveator; pecs; teres; biceps, deltoid    Passive ROM Lt  shoulder flex; ER, IR, abduction in scapular plane; extension and horizontal abduction    Manual Traction through long arm at side 10-20 sec pull x 3-4 during treatment              Trigger Point Dry Needling - 12/24/20 0001     Consent Given? Yes    Education Handout Provided Previously provided    Other Dry Needling Lt    Upper Trapezius Response Palpable increased muscle length    Scalenes Response Palpable increased muscle length    Cervical multifidi Response Palpable increased muscle length    Teres major Response Palpable increased muscle length    Teres minor Response Palpable increased muscle length                  PT Education - 12/24/20 1140     Education Details HEP    Person(s) Educated Patient    Methods Explanation;Demonstration;Tactile cues;Verbal cues;Handout    Comprehension Verbalized understanding;Returned demonstration;Verbal cues required;Tactile cues required                 PT Long Term Goals - 12/11/20 1111       PT LONG TERM GOAL #1   Title Pt will be independent with HEP    Time 6    Period Weeks    Status On-going    Target Date 01/22/21      PT LONG TERM GOAL #2   Title Pt will improve FOTO to >= 65 to demo functional improvement    Time 6    Status On-going    Target Date 01/22/21      PT LONG TERM GOAL #3   Title Pt will improve Lt UE strength to 4/5 to perform IADLs with decreased pain    Baseline -    Time 6    Period Weeks    Status On-going    Target Date 01/22/21      PT LONG TERM GOAL #4   Title Pt will improve Lt shoulder ROM to within 10 degrees of Rt shoulder to perform ADLs with decreased pain    Time 6    Period Weeks    Status On-going    Target Date 01/22/21      PT LONG TERM GOAL #5  Title -                   Plan - 12/24/20 1121     Clinical Impression Statement Good improvement in shoulder pain and increase in functional activity tolerance. Progressing with exercises for  strengthening. Continues with DN and manual work to address muscular tightness.    Rehab Potential Good    PT Frequency 2x / week    PT Duration 6 weeks    PT Treatment/Interventions ADLs/Self Care Home Management;Cryotherapy;Moist Heat;Iontophoresis 4mg /ml Dexamethasone;Electrical Stimulation;Functional mobility training;Neuromuscular re-education;Therapeutic exercise;Therapeutic activities;Patient/family education;Manual techniques;Passive range of motion;Dry needling;Taping;Vasopneumatic Device    PT Next Visit Plan continue with mangement of pain and PROM, gentle AROM; gradually add active and resistive exercise gradually as tolerated - needs work for posterior shoulder girdle strengthening. DN and manual work through [employer] cervical and shoulder girdle areas; modalities as indicated    PT Home Exercise Plan [phone]    Consulted and Agree with Plan of Care Patient             Patient will benefit from skilled therapeutic intervention in order to improve the following deficits and impairments:     Visit Diagnosis: Chronic left shoulder pain  Muscle weakness (generalized)  Other symptoms and signs involving the musculoskeletal system     Problem List Patient Active Problem List   Diagnosis Date Noted   Indication present for endocarditis prophylaxis 05/07/2020   PVC (premature ventricular contraction) 05/07/2020   Major depressive disorder with single episode, in partial remission ([place]) 05/07/2020   Bursitis of left shoulder 02/08/2020   Annual physical exam 01/10/2020   Sleeping difficulties 01/10/2020   Rheumatoid arthritis of multiple sites with negative rheumatoid factor ([place]) 12/04/2019   Polyarthralgia 11/02/2019   Myofascial pain 11/02/2019   Arthralgia of both hands 10/03/2019   Metatarsalgia of left foot 01/05/2018   Other hyperlipidemia 03/23/2017   OSA (obstructive sleep apnea) 02/25/2017   Class 1 obesity without serious comorbidity with body mass index (BMI) of  34.0 to 34.9 in adult 08/17/2016   Elevated liver function tests 08/17/2016   Vitamin D deficiency 08/03/2016   Left tennis elbow 04/06/2016   Anxious depression 02/20/2016   Depression 08/23/2015   Subacromial bursitis 04/23/2015   Left carpal tunnel syndrome, post right carpal tunnel release 12/24/2014   Trochanteric bursitis of right hip 04/21/2013   Postop Mild Hyponatremia 07/30/2011   OSTEOARTHRITIS, KNEE, LEFT 12/12/2009   MIGRAINE WITH AURA 05/31/2008   SKIN CANCER, HX OF 01/06/2008   COLITIS, HX OF 01/06/2008   ALLERGIC RHINITIS 05/20/2007   HYPERTENSION, BENIGN ESSENTIAL 11/23/2006   SCIATICA, CHRONIC 05/19/2006    Kathy Clark Kathy Clark PT, MPH  12/24/2020, 12:13 PM  [facility] Kathy Clark, [place], [address] Phone: [phone]   Fax:  [phone]  Name: Kathy Clark MRN: [id number] Date of Birth: 09-04-1960

## 2020-12-25 ENCOUNTER — Encounter: Payer: Self-pay | Admitting: Family Medicine

## 2020-12-26 ENCOUNTER — Ambulatory Visit (INDEPENDENT_AMBULATORY_CARE_PROVIDER_SITE_OTHER): Payer: 59 | Admitting: Rehabilitative and Restorative Service Providers"

## 2020-12-26 ENCOUNTER — Encounter: Payer: Self-pay | Admitting: Rehabilitative and Restorative Service Providers"

## 2020-12-26 ENCOUNTER — Other Ambulatory Visit: Payer: Self-pay

## 2020-12-26 DIAGNOSIS — G8929 Other chronic pain: Secondary | ICD-10-CM

## 2020-12-26 DIAGNOSIS — R29898 Other symptoms and signs involving the musculoskeletal system: Secondary | ICD-10-CM | POA: Diagnosis not present

## 2020-12-26 DIAGNOSIS — M6281 Muscle weakness (generalized): Secondary | ICD-10-CM | POA: Diagnosis not present

## 2020-12-26 DIAGNOSIS — M25512 Pain in left shoulder: Secondary | ICD-10-CM

## 2020-12-26 NOTE — Therapy (Signed)
[place], [place], [address] Phone: [phone]   Fax:  [phone]  Physical Therapy Treatment  Patient Details  Name: Kathy Clark MRN: [id number] Date of Birth: [birth year] Referring Provider (PT): chandler, justin   Encounter Date: 12/26/2020   PT End of Session - 12/26/20 1150     Visit Number 22    Number of Visits 30    Date for PT Re-Evaluation 01/22/21    PT Start Time 1148    PT Stop Time 1236    PT Time Calculation (min) 48 min    Activity Tolerance Patient tolerated treatment well             Past Medical History:  Diagnosis Date   Anxiety    Back pain    Complication of anesthesia    allergy to Succinylcholine   Depression    Essential hypertension, benign    Gastritis    GERD (gastroesophageal reflux disease)    Joint pain    Lymphocytic colitis    microscopic   Migraines    Osteoarthritis    Sciatica    right leg   Swelling    feet, left foot    Past Surgical History:  Procedure Laterality Date   ABDOMINAL HYSTERECTOMY  07/2005   [place] Bunionectomy Left 09/18/2016   Left Foot   BACK SURGERY     KNEE SURGERY  2006   left; multiple   microdiscetomy  2005   SHOULDER SURGERY  07/22/2009   right   SKIN CANCER EXCISION     eyelid   TOTAL ABDOMINAL HYSTERECTOMY     TOTAL KNEE ARTHROPLASTY  07/27/2011   Procedure: TOTAL KNEE ARTHROPLASTY;  Surgeon: Gearlean Alf;  Location: [facility];  Service: Orthopedics;  Laterality: Left;    There were no vitals filed for this visit.   Subjective Assessment - 12/26/20 1150     Subjective Patient reports that she was sore after the last treatment but felt that shoulder was looser. Just continuing to improve. No pain.    Currently in Pain? No/denies    Pain Score 0-No pain                               [facility] Adult PT Treatment/Exercise - 12/26/20 0001       Shoulder Exercises: Seated   Other  Seated Exercises shoulder rolls backwards x 10      Shoulder Exercises: Standing   External Rotation Strengthening;Left    External Rotation Limitations ball on wall at dorsum of hand circles CW/CCW ~ 1 min x 2 reps - patient turned btn back and side to wall    Flexion AROM;Strengthening;10 reps;Theraband   2-3 sec hold - yellow TB at wrists serratus work   Theraband Level (Shoulder Flexion) Level 1 (Yellow)    Extension Strengthening;Both;10 reps;Theraband    Theraband Level (Shoulder Extension) Level 2 (Red)    Row Strengthening;Both;10 reps;Theraband    Theraband Level (Shoulder Row) Level 2 (Red)    Row Limitations bow and arrow x 10 reps each UE  red TB      Shoulder Exercises: Pulleys   Flexion 2 minutes    Scaption 1 minute    Other Pulley Exercises horizontal ab/ad hands level moving hands in and out      Shoulder Exercises: Stretch   External Rotation Stretch 5 reps;10 seconds   standing at doorway turning  to Rt   Wall Stretch - Flexion 5 reps      Moist Heat Therapy   Number Minutes Moist Heat 10 Minutes    Moist Heat Location Shoulder;Cervical      Manual Therapy   Manual Therapy --   pt prone and supine   Soft tissue mobilization STM Lt cervical; upper trap/mid trap; leveator; pecs; teres; biceps, deltoid    Passive ROM Lt shoulder flex; ER, IR, abduction in scapular plane; extension and horizontal abduction    Manual Traction through long arm at side 10-20 sec pull x 3-4 during treatment                    PT Education - 12/26/20 1217     Education Details HEP    Person(s) Educated Patient    Methods Explanation;Demonstration;Tactile cues;Verbal cues;Handout    Comprehension Verbalized understanding;Returned demonstration;Verbal cues required;Tactile cues required                 PT Long Term Goals - 12/11/20 1111       PT LONG TERM GOAL #1   Title Pt will be independent with HEP    Time 6    Period Weeks    Status On-going    Target  Date 01/22/21      PT LONG TERM GOAL #2   Title Pt will improve FOTO to >= 65 to demo functional improvement    Time 6    Status On-going    Target Date 01/22/21      PT LONG TERM GOAL #3   Title Pt will improve Lt UE strength to 4/5 to perform IADLs with decreased pain    Baseline -    Time 6    Period Weeks    Status On-going    Target Date 01/22/21      PT LONG TERM GOAL #4   Title Pt will improve Lt shoulder ROM to within 10 degrees of Rt shoulder to perform ADLs with decreased pain    Time 6    Period Weeks    Status On-going    Target Date 01/22/21      PT LONG TERM GOAL #5   Title -                   Plan - 12/26/20 1156     Clinical Impression Statement Decreased pain and improved mobility and ROM. Progressing with strengthening program. No longer having pain into the neck and jaw area. Added strengthening without difficulty. Note less palpable tightness through Lt cervical and shoulder girdle area. Patient will continue with independent HEP for next week then decrease frequency to 1x/wk to work on strengthening.    Rehab Potential Good    PT Frequency 2x / week    PT Duration 6 weeks    PT Treatment/Interventions ADLs/Self Care Home Management;Cryotherapy;Moist Heat;Iontophoresis 4mg /ml Dexamethasone;Electrical Stimulation;Functional mobility training;Neuromuscular re-education;Therapeutic exercise;Therapeutic activities;Patient/family education;Manual techniques;Passive range of motion;Dry needling;Taping;Vasopneumatic Device    PT Next Visit Plan continue with mangement of pain and PROM, gentle AROM; gradually add active and resistive exercise gradually as tolerated - needs work for posterior shoulder girdle strengthening. manual work through [employer] cervical and shoulder girdle areas; modalities as indicated    PT Home Exercise Plan [phone]    Consulted and Agree with Plan of Care Patient             Patient will benefit from skilled therapeutic  intervention in order to improve the  following deficits and impairments:     Visit Diagnosis: Chronic left shoulder pain  Muscle weakness (generalized)  Other symptoms and signs involving the musculoskeletal system     Problem List Patient Active Problem List   Diagnosis Date Noted   Indication present for endocarditis prophylaxis 05/07/2020   PVC (premature ventricular contraction) 05/07/2020   Major depressive disorder with single episode, in partial remission ([place]) 05/07/2020   Bursitis of left shoulder 02/08/2020   Annual physical exam 01/10/2020   Sleeping difficulties 01/10/2020   Rheumatoid arthritis of multiple sites with negative rheumatoid factor ([place]) 12/04/2019   Polyarthralgia 11/02/2019   Myofascial pain 11/02/2019   Arthralgia of both hands 10/03/2019   Metatarsalgia of left foot 01/05/2018   Other hyperlipidemia 03/23/2017   OSA (obstructive sleep apnea) 02/25/2017   Class 1 obesity without serious comorbidity with body mass index (BMI) of 34.0 to 34.9 in adult 08/17/2016   Elevated liver function tests 08/17/2016   Vitamin D deficiency 08/03/2016   Left tennis elbow 04/06/2016   Anxious depression 02/20/2016   Depression 08/23/2015   Subacromial bursitis 04/23/2015   Left carpal tunnel syndrome, post right carpal tunnel release 12/24/2014   Trochanteric bursitis of right hip 04/21/2013   Postop Mild Hyponatremia 07/30/2011   OSTEOARTHRITIS, KNEE, LEFT 12/12/2009   MIGRAINE WITH AURA 05/31/2008   SKIN CANCER, HX OF 01/06/2008   COLITIS, HX OF 01/06/2008   ALLERGIC RHINITIS 05/20/2007   HYPERTENSION, BENIGN ESSENTIAL 11/23/2006   SCIATICA, CHRONIC 05/19/2006    Jensyn Shave Nilda Simmer PT, MPH  12/26/2020, 1:01 PM  [facility] [place], [place], [address] Phone: [phone]   Fax:  [phone]  Name: Kathy Clark MRN: [id number] Date of Birth: [birth year]

## 2020-12-26 NOTE — Patient Instructions (Signed)
Access Code: [email or web address]: [email or web address]: 06/16/2022Prepared by: Ayush Boulet HoltExercises  Shoulder External Rotation and Scapular Retraction - 2 x daily - 7 x weekly - 1 sets - 10 reps - 5 sec hold  Seated Shoulder W - 2 x daily - 7 x weekly - 1 sets - 10 reps - 3 sec hold  Standing Shoulder External Rotation with Resistance - 2 x daily - 7 x weekly - 1-3 sets - 10 reps - 2-3 sec hold  Standing Bilateral Low Shoulder Row with Anchored Resistance - 2 x daily - 7 x weekly - 1-3 sets - 10 reps - 2-3 sec hold  Shoulder Extension with Resistance - 2 x daily - 7 x weekly - 1-3 sets - 10 reps - 2-3 sec hold  Bilateral Scapular Depression with Anchored Resistance - Straight Arm - 2 x daily - 7 x weekly - 2 sets - 10 reps - 3-5 sec hold  Seated Upper Trap Stretch - 2 x daily - 7 x weekly - 1 sets - 3 reps - 10 sec hold  Standing Shoulder Internal Rotation Stretch with Towel - 2 x daily - 7 x weekly - 1 sets - 3-5 reps - 15-20 sec hold  Sidelying Shoulder External Rotation - 2 x daily - 7 x weekly - 1 sets - 10 reps - 3 sec hold  Seated Shoulder Press with Resistance - 1 x daily - 7 x weekly - 2-3 sets - 10 reps - 3 sec hold  Bent Over Single Arm Shoulder Row with Dumbbell - 1 x daily - 7 x weekly - 1 sets - 10 reps - 3 sec hold  Standing Bent Over Triceps Extension - 1 x daily - 7 x weekly - 1-2 sets - 10 reps - 2-3 sec hold  Shoulder Flexion Serratus Activation with Resistance - 1 x daily - 7 x weekly - 1 sets - 10 reps - 3-5 sec hold  Doorway Pec Stretch at 60 Degrees Abduction - 3 x daily - 7 x weekly - 3 reps - 1 sets  Doorway Pec Stretch at 90 Degrees Abduction - 3 x daily - 7 x weekly - 3 reps - 1 sets - 30 seconds hold  Doorway Pec Stretch at 120 Degrees Abduction - 3 x daily - 7 x weekly - 3 reps - 1 sets - 30 second hold hold  Wall Clock with Theraband - 1 x daily - 7 x weekly - 1 sets - 10 reps - 2-3sec hold  Wall Push Up - 2 x daily - 7 x weekly - 1 sets - 10 reps - 3 sec  hold

## 2020-12-29 ENCOUNTER — Other Ambulatory Visit: Payer: Self-pay | Admitting: Family Medicine

## 2020-12-29 MED FILL — Cyclobenzaprine HCl Tab 5 MG: ORAL | 30 days supply | Qty: 30 | Fill #0 | Status: AC

## 2020-12-30 ENCOUNTER — Other Ambulatory Visit (HOSPITAL_COMMUNITY): Payer: Self-pay

## 2020-12-30 ENCOUNTER — Other Ambulatory Visit: Payer: Self-pay | Admitting: Family Medicine

## 2020-12-30 ENCOUNTER — Other Ambulatory Visit: Payer: Self-pay | Admitting: Pharmacist

## 2020-12-30 MED ORDER — VILAZODONE HCL 20 MG PO TABS
1.0000 | ORAL_TABLET | Freq: Every day | ORAL | 0 refills | Status: DC
  Filled 2020-12-30 – 2021-01-01 (×2): qty 30, 30d supply, fill #0

## 2020-12-30 MED ORDER — ENBREL SURECLICK 50 MG/ML ~~LOC~~ SOAJ
SUBCUTANEOUS | 3 refills | Status: DC
  Filled 2020-12-30: qty 4, fill #0
  Filled 2021-01-02: qty 4, 28d supply, fill #0
  Filled 2021-01-27 – 2021-02-04 (×3): qty 4, 28d supply, fill #1
  Filled 2021-02-27 – 2021-03-03 (×2): qty 4, 28d supply, fill #2
  Filled 2021-03-27: qty 4, 28d supply, fill #3

## 2020-12-30 MED ORDER — ENBREL SURECLICK 50 MG/ML ~~LOC~~ SOAJ: SUBCUTANEOUS | 3 refills | Status: DC

## 2020-12-31 ENCOUNTER — Other Ambulatory Visit (HOSPITAL_COMMUNITY): Payer: Self-pay

## 2020-12-31 MED ORDER — LEFLUNOMIDE 10 MG PO TABS
ORAL_TABLET | ORAL | 2 refills | Status: DC
  Filled 2020-12-31 (×2): qty 30, 30d supply, fill #0
  Filled 2021-01-20: qty 30, 30d supply, fill #1
  Filled 2021-02-21: qty 30, 30d supply, fill #2

## 2021-01-01 ENCOUNTER — Other Ambulatory Visit (HOSPITAL_COMMUNITY): Payer: Self-pay

## 2021-01-01 DIAGNOSIS — G4733 Obstructive sleep apnea (adult) (pediatric): Secondary | ICD-10-CM | POA: Diagnosis not present

## 2021-01-02 ENCOUNTER — Other Ambulatory Visit (HOSPITAL_COMMUNITY): Payer: Self-pay

## 2021-01-03 ENCOUNTER — Ambulatory Visit: Payer: 59 | Admitting: Cardiology

## 2021-01-06 ENCOUNTER — Encounter: Payer: 59 | Admitting: Rehabilitative and Restorative Service Providers"

## 2021-01-08 ENCOUNTER — Other Ambulatory Visit: Payer: Self-pay

## 2021-01-08 ENCOUNTER — Ambulatory Visit (INDEPENDENT_AMBULATORY_CARE_PROVIDER_SITE_OTHER): Payer: 59 | Admitting: Medical-Surgical

## 2021-01-08 ENCOUNTER — Encounter: Payer: Self-pay | Admitting: Rehabilitative and Restorative Service Providers"

## 2021-01-08 ENCOUNTER — Ambulatory Visit (INDEPENDENT_AMBULATORY_CARE_PROVIDER_SITE_OTHER): Payer: 59 | Admitting: Rehabilitative and Restorative Service Providers"

## 2021-01-08 ENCOUNTER — Encounter: Payer: Self-pay | Admitting: Medical-Surgical

## 2021-01-08 VITALS — BP 119/79 | HR 68 | Temp 97.8°F | Ht 68.0 in | Wt 252.4 lb

## 2021-01-08 DIAGNOSIS — G8929 Other chronic pain: Secondary | ICD-10-CM | POA: Diagnosis not present

## 2021-01-08 DIAGNOSIS — M25512 Pain in left shoulder: Secondary | ICD-10-CM | POA: Diagnosis not present

## 2021-01-08 DIAGNOSIS — M6281 Muscle weakness (generalized): Secondary | ICD-10-CM | POA: Diagnosis not present

## 2021-01-08 DIAGNOSIS — R29898 Other symptoms and signs involving the musculoskeletal system: Secondary | ICD-10-CM

## 2021-01-08 DIAGNOSIS — Z0000 Encounter for general adult medical examination without abnormal findings: Secondary | ICD-10-CM | POA: Diagnosis not present

## 2021-01-08 NOTE — Patient Instructions (Addendum)
Can be sitting or standing   Standing back to wall with noodle along spine Chest up; shoulders down and back; reach for floor with hands or with elbows 5-10 sec hold  5-10 reps   Then same position as above keep chin tucked and tip head to left  Hold 5-10 sec  Repeat to right 3-5 reps  Alternating sides

## 2021-01-08 NOTE — Patient Instructions (Signed)
Preventive Care 61-61 Years Old, Female Preventive care refers to lifestyle choices and visits with your health care provider that can promote health and wellness. This includes: A yearly physical exam. This is also called an annual wellness visit. Regular dental and eye exams. Immunizations. Screening for certain conditions. Healthy lifestyle choices, such as: Eating a healthy diet. Getting regular exercise. Not using drugs or products that contain nicotine and tobacco. Limiting alcohol use. What can I expect for my preventive care visit? Physical exam Your health care provider will check your: Height and weight. These may be used to calculate your BMI (body mass index). BMI is a measurement that tells if you are at a healthy weight. Heart rate and blood pressure. Body temperature. Skin for abnormal spots. Counseling Your health care provider may ask you questions about your: Past medical problems. Family's medical history. Alcohol, tobacco, and drug use. Emotional well-being. Home life and relationship well-being. Sexual activity. Diet, exercise, and sleep habits. Work and work Statistician. Access to firearms. Method of birth control. Menstrual cycle. Pregnancy history. What immunizations do I need?  Vaccines are usually given at various ages, according to a schedule. Your health care provider will recommend vaccines for you based on your age, medicalhistory, and lifestyle or other factors, such as travel or where you work. What tests do I need? Blood tests Lipid and cholesterol levels. These may be checked every 5 years, or more often if you are over 37 years old. Hepatitis C test. Hepatitis B test. Screening Lung cancer screening. You may have this screening every year starting at age 30 if you have a 30-pack-year history of smoking and currently smoke or have quit within the past 15 years. Colorectal cancer screening. All adults should have this screening starting at  age 23 and continuing until age 3. Your health care provider may recommend screening at age 88 if you are at increased risk. You will have tests every 1-10 years, depending on your results and the type of screening test. Diabetes screening. This is done by checking your blood sugar (glucose) after you have not eaten for a while (fasting). You may have this done every 1-3 years. Mammogram. This may be done every 1-2 years. Talk with your health care provider about when you should start having regular mammograms. This may depend on whether you have a family history of breast cancer. BRCA-related cancer screening. This may be done if you have a family history of breast, ovarian, tubal, or peritoneal cancers. Pelvic exam and Pap test. This may be done every 3 years starting at age 79. Starting at age 54, this may be done every 5 years if you have a Pap test in combination with an HPV test. Other tests STD (sexually transmitted disease) testing, if you are at risk. Bone density scan. This is done to screen for osteoporosis. You may have this scan if you are at high risk for osteoporosis. Talk with your health care provider about your test results, treatment options,and if necessary, the need for more tests. Follow these instructions at home: Eating and drinking  Eat a diet that includes fresh fruits and vegetables, whole grains, lean protein, and low-fat dairy products. Take vitamin and mineral supplements as recommended by your health care provider. Do not drink alcohol if: Your health care provider tells you not to drink. You are pregnant, may be pregnant, or are planning to become pregnant. If you drink alcohol: Limit how much you have to 0-1 drink a day. Be aware  of how much alcohol is in your drink. In the [place] one drink equals one 12 oz bottle of beer (355 mL), one 5 oz glass of wine (148 mL), or one 1 oz glass of hard liquor (44 mL).  Lifestyle Take daily care of your teeth and  gums. Brush your teeth every morning and night with fluoride toothpaste. Floss one time each day. Stay active. Exercise for at least 30 minutes 5 or more days each week. Do not use any products that contain nicotine or tobacco, such as cigarettes, e-cigarettes, and chewing tobacco. If you need help quitting, ask your health care provider. Do not use drugs. If you are sexually active, practice safe sex. Use a condom or other form of protection to prevent STIs (sexually transmitted infections). If you do not wish to become pregnant, use a form of birth control. If you plan to become pregnant, see your health care provider for a prepregnancy visit. If told by your health care provider, take low-dose aspirin daily starting at age 29. Find healthy ways to cope with stress, such as: Meditation, yoga, or listening to music. Journaling. Talking to a trusted person. Spending time with friends and family. Safety Always wear your seat belt while driving or riding in a vehicle. Do not drive: If you have been drinking alcohol. Do not ride with someone who has been drinking. When you are tired or distracted. While texting. Wear a helmet and other protective equipment during sports activities. If you have firearms in your house, make sure you follow all gun safety procedures. What's next? Visit your health care provider once a year for an annual wellness visit. Ask your health care provider how often you should have your eyes and teeth checked. Stay up to date on all vaccines. This information is not intended to replace advice given to you by your health care provider. Make sure you discuss any questions you have with your healthcare provider. Document Revised: 04/02/2020 Document Reviewed: 03/10/2018 Elsevier Patient Education  2022 [employer].

## 2021-01-08 NOTE — Therapy (Addendum)
[facility] [place] [address] [place], [place], [address] Phone: [phone]   Fax:  [phone]  Physical Therapy Treatment  Patient Details  Name: Kathy Clark MRN: [id number] Date of Birth: 1960-08-05 Referring Provider (PT): chandler, justin   Encounter Date: 01/08/2021   PT End of Session - 01/08/21 1346     Visit Number 23    Number of Visits 30    Date for PT Re-Evaluation 01/22/21    PT Start Time 1347    PT Stop Time 1440    PT Time Calculation (min) 53 min    Activity Tolerance Patient tolerated treatment well             Past Medical History:  Diagnosis Date   Anxiety    Back pain    Complication of anesthesia    allergy to Succinylcholine   Depression    Essential hypertension, benign    Gastritis    GERD (gastroesophageal reflux disease)    Joint pain    Lymphocytic colitis    microscopic   Migraines    Osteoarthritis    Sciatica    right leg   Swelling    feet, left foot    Past Surgical History:  Procedure Laterality Date   ABDOMINAL HYSTERECTOMY  07/2005   [place] Bunionectomy Left 09/18/2016   Left Foot   BACK SURGERY     KNEE SURGERY  2006   left; multiple   microdiscetomy  2005   SHOULDER SURGERY  07/22/2009   right   SKIN CANCER EXCISION     eyelid   TOTAL ABDOMINAL HYSTERECTOMY     TOTAL KNEE ARTHROPLASTY  07/27/2011   Procedure: TOTAL KNEE ARTHROPLASTY;  Surgeon: Gearlean Alf;  Location: [facility];  Service: Orthopedics;  Laterality: Left;    There were no vitals filed for this visit.   Subjective Assessment - 01/08/21 1349     Subjective Patient reports that she is doing well overall. She is doing exercises. Has some pain and popping in the Lt shoulder. Pain and irritation with driving and playing keyboard.    Currently in Pain? No/denies    Pain Score 0-No pain                [facility] PT Assessment - 01/08/21 0001       Assessment   Medical Diagnosis left  frozen shoulder s/p I & D    Referring Provider (PT) chandler, justin    Onset Date/Surgical Date 03/13/20    Hand Dominance Right      AROM   Left Shoulder Extension 55 Degrees    Left Shoulder Flexion 150 Degrees    Left Shoulder ABduction 155 Degrees    Left Shoulder Internal Rotation --   thumb to T12   Left Shoulder External Rotation 90 Degrees   shoulder 90 deg elbow 90 deg     Palpation   Palpation comment muscular tightness Lt lateral cervical musculature and shoulder girdle through pecs; upper trap; leveator; teres; lats                           [facility] Adult PT Treatment/Exercise - 01/08/21 0001       Neuro Re-ed    Neuro Re-ed Details  working on scapular stabilization with active movement Lt UE; reach for floor with UE's several reps with chest up; lateral cervical flexion with reach for floor 10 sec holx x 3 reps  Shoulder Exercises: Seated   Other Seated Exercises shoulder rolls backwards x 10      Shoulder Exercises: Standing   External Rotation Strengthening;Left    External Rotation Limitations isometric step back    Internal Rotation Strengthening;Left;5 reps;Theraband    Theraband Level (Shoulder Internal Rotation) Level 3 (Green)    Internal Rotation Limitations isometric step out    Flexion --   2-3 sec hold - yellow TB at wrists serratus work   Extension Strengthening;Both;10 reps;Theraband    Theraband Level (Shoulder Extension) Level 3 (Green)    Extension Limitations isometric step back    Row Strengthening;Both;10 reps;Theraband   isometric step back   Theraband Level (Shoulder Row) Level 3 (Green)    Retraction Strengthening;Both;10 reps;Theraband    Theraband Level (Shoulder Retraction) Level 1 (Yellow)    Other Standing Exercises scap squeeze with noodle 10 sec x 10 reps; L's x 10; W's x 10      Shoulder Exercises: Pulleys   Flexion 2 minutes    Scaption 1 minute    Other Pulley Exercises horizontal ab/ad hands level moving  hands in and out      Shoulder Exercises: ROM/Strengthening   Other ROM/Strengthening Exercises shoulder activation of serratus yellow TB x 10    Other ROM/Strengthening Exercises scapular clock bilat x 5 reps      Moist Heat Therapy   Number Minutes Moist Heat 10 Minutes    Moist Heat Location Shoulder;Cervical      Manual Therapy   Manual therapy comments skilled palpation to assess response to DN/manual work    Soft tissue mobilization STM Lt cervical; upper trap/mid trap; leveator; pecs; teres; biceps, deltoid    Manual Traction through long arm at side 10-20 sec pull x 3-4 during treatment              Trigger Point Dry Needling - 01/08/21 0001     Consent Given? Yes    Education Handout Provided Previously provided    Other Dry Needling Lt    Upper Trapezius Response Palpable increased muscle length    Pectoralis Major Response Palpable increased muscle length    Pectoralis Minor Response Palpable increased muscle length                  PT Education - 01/08/21 1419     Education Details HEP    Person(s) Educated Patient    Methods Explanation;Demonstration;Tactile cues;Verbal cues;Handout    Comprehension Verbalized understanding;Returned demonstration;Verbal cues required;Tactile cues required                 PT Long Term Goals - 12/11/20 1111       PT LONG TERM GOAL #1   Title Pt will be independent with HEP    Time 6    Period Weeks    Status On-going    Target Date 01/22/21      PT LONG TERM GOAL #2   Title Pt will improve FOTO to >= 65 to demo functional improvement    Time 6    Status On-going    Target Date 01/22/21      PT LONG TERM GOAL #3   Title Pt will improve Lt UE strength to 4/5 to perform IADLs with decreased pain    Baseline -    Time 6    Period Weeks    Status On-going    Target Date 01/22/21      PT LONG TERM GOAL #4   Title Pt  will improve Lt shoulder ROM to within 10 degrees of Rt shoulder to perform ADLs  with decreased pain    Time 6    Period Weeks    Status On-going    Target Date 01/22/21      PT LONG TERM GOAL #5   Title -                   Plan - 01/08/21 1358     Clinical Impression Statement Continued gradual progression of strength and stability. Patient is increasing resistive exercises and variety of exercises at home. Functional activity level is increasing.    Rehab Potential Good    PT Frequency 2x / week    PT Duration 6 weeks    PT Treatment/Interventions ADLs/Self Care Home Management;Cryotherapy;Moist Heat;Iontophoresis 59m/ml Dexamethasone;Electrical Stimulation;Functional mobility training;Neuromuscular re-education;Therapeutic exercise;Therapeutic activities;Patient/family education;Manual techniques;Passive range of motion;Dry needling;Taping;Vasopneumatic Device    PT Next Visit Plan continue with mangement of pain and PROM, gentle AROM; gradually add active and resistive exercise gradually as tolerated - needs work for posterior shoulder girdle strengthening. manual work through [employer] Corporationcervical and shoulder girdle areas; modalities as indicated    PT Home Exercise Plan [id number]   Consulted and Agree with Plan of Care Patient             Patient will benefit from skilled therapeutic intervention in order to improve the following deficits and impairments:     Visit Diagnosis: Chronic left shoulder pain  Muscle weakness (generalized)  Other symptoms and signs involving the musculoskeletal system     Problem List Patient Active Problem List   Diagnosis Date Noted   Indication present for endocarditis prophylaxis 05/07/2020   PVC (premature ventricular contraction) 05/07/2020   Major depressive disorder with single episode, in partial remission ([place] 05/07/2020   Bursitis of left shoulder 02/08/2020   Annual physical exam 01/10/2020   Sleeping difficulties 01/10/2020   Rheumatoid arthritis of multiple sites with negative rheumatoid factor ([place]  12/04/2019   Polyarthralgia 11/02/2019   Myofascial pain 11/02/2019   Arthralgia of both hands 10/03/2019   Metatarsalgia of left foot 01/05/2018   Other hyperlipidemia 03/23/2017   OSA (obstructive sleep apnea) 02/25/2017   Class 1 obesity without serious comorbidity with body mass index (BMI) of 34.0 to 34.9 in adult 08/17/2016   Elevated liver function tests 08/17/2016   Vitamin D deficiency 08/03/2016   Left tennis elbow 04/06/2016   Anxious depression 02/20/2016   Depression 08/23/2015   Subacromial bursitis 04/23/2015   Left carpal tunnel syndrome, post right carpal tunnel release 12/24/2014   Trochanteric bursitis of right hip 04/21/2013   Postop Mild Hyponatremia 07/30/2011   OSTEOARTHRITIS, KNEE, LEFT 12/12/2009   MIGRAINE WITH AURA 05/31/2008   SKIN CANCER, HX OF 01/06/2008   COLITIS, HX OF 01/06/2008   ALLERGIC RHINITIS 05/20/2007   HYPERTENSION, BENIGN ESSENTIAL 11/23/2006   SCIATICA, CHRONIC 05/19/2006    Maxten Shuler PNilda SimmerPT, MPH  01/08/2021, 2:45 PM  [facility] [address]Phone: [phone]  Fax:  [phone] Name: TAUDELIA KNAPEMRN: [id number] of Birth: 901-Jul-1962  PHYSICAL THERAPY DISCHARGE SUMMARY  Visits from Start of Care: 23  Current functional level related to goals / functional outcomes: See progress note for discharge status   Remaining deficits: Needs to continue with HEP including stretching and strengthening    Education / Equipment: HEP    Patient agrees to discharge. Patient goals  were met. Patient is being discharged due to meeting the stated rehab goals.  Mignonne Afonso P. Helene Kelp PT, MPH 02/19/21 8:24 AM

## 2021-01-08 NOTE — Progress Notes (Signed)
HPI: Kathy Clark is a 60 y.o. female who  has a past medical history of Anxiety, Back pain, Complication of anesthesia, Depression, Essential hypertension, benign, Gastritis, GERD (gastroesophageal reflux disease), Joint pain, Lymphocytic colitis, Migraines, Osteoarthritis, Sciatica, and Swelling.  she presents to [facility] today, 01/08/21,  for chief complaint of: Annual physical exam  Dentist: twice yearly Eye exam: due in July, glasses Exercise: walking in short periods several times daily Diet: room for improvement, no special diets, lean proteins, low carb, more fruits/veggies  Pap smear: Hysterectomy Mammogram: UTD Colon cancer screening: Colonoscopy 2017 COVID vaccine: Done and booster  Concerns: None  Past medical, surgical, social and family history reviewed:  Patient Active Problem List   Diagnosis Date Noted   Indication present for endocarditis prophylaxis 05/07/2020   PVC (premature ventricular contraction) 05/07/2020   Major depressive disorder with single episode, in partial remission ([place]) 05/07/2020   Bursitis of left shoulder 02/08/2020   Annual physical exam 01/10/2020   Sleeping difficulties 01/10/2020   Rheumatoid arthritis of multiple sites with negative rheumatoid factor ([place]) 12/04/2019   Polyarthralgia 11/02/2019   Myofascial pain 11/02/2019   Arthralgia of both hands 10/03/2019   Metatarsalgia of left foot 01/05/2018   Other hyperlipidemia 03/23/2017   OSA (obstructive sleep apnea) 02/25/2017   Class 1 obesity without serious comorbidity with body mass index (BMI) of 34.0 to 34.9 in adult 08/17/2016   Elevated liver function tests 08/17/2016   Vitamin D deficiency 08/03/2016   Left tennis elbow 04/06/2016   Anxious depression 02/20/2016   Depression 08/23/2015   Subacromial bursitis 04/23/2015   Left carpal tunnel syndrome, post right carpal tunnel release 12/24/2014   Trochanteric bursitis of right hip  04/21/2013   Postop Mild Hyponatremia 07/30/2011   OSTEOARTHRITIS, KNEE, LEFT 12/12/2009   MIGRAINE WITH AURA 05/31/2008   SKIN CANCER, HX OF 01/06/2008   COLITIS, HX OF 01/06/2008   ALLERGIC RHINITIS 05/20/2007   HYPERTENSION, BENIGN ESSENTIAL 11/23/2006   SCIATICA, CHRONIC 05/19/2006    Past Surgical History:  Procedure Laterality Date   ABDOMINAL HYSTERECTOMY  07/2005   [place] Bunionectomy Left 09/18/2016   Left Foot   BACK SURGERY     KNEE SURGERY  2006   left; multiple   microdiscetomy  2005   SHOULDER SURGERY  07/22/2009   right   SKIN CANCER EXCISION     eyelid   TOTAL ABDOMINAL HYSTERECTOMY     TOTAL KNEE ARTHROPLASTY  07/27/2011   Procedure: TOTAL KNEE ARTHROPLASTY;  Surgeon: Gearlean Alf;  Location: [facility];  Service: Orthopedics;  Laterality: Left;    Social History   Tobacco Use   Smoking status: Never   Smokeless tobacco: Never  Substance Use Topics   Alcohol use: Yes    Comment: occasionally    Family History  Problem Relation Age of Onset   Breast cancer Other    Tongue cancer Mother    Breast cancer Mother    Anxiety disorder Mother    Depression Mother    Heart disease Father    Hypertension Father    Hyperlipidemia Father    Stroke Paternal Grandmother    Colon cancer Neg Hx      Current medication list and allergy/intolerance information reviewed:    Current Outpatient Medications  Medication Sig Dispense Refill   AMBULATORY NON FORMULARY MEDICATION Medication Name: Please decrease CPAP setting to 8 cm of water pressure and fax [place] a download after 5 days.  Is having a  lot of difficulty with air leaks is working to try to reduce her pressure to see if she is still being adequately treated but with fewer leaks.FAx to [place] 1 Units 0   Calcium Carbonate-Vitamin D (CALTRATE 600+D PO) Take 1 tablet by mouth daily.     clindamycin (CLEOCIN) 300 MG capsule TAKE 2 CAPSULES BY MOUTH ONCE FOR 1 DOSE 1 HOUR PRIOR TO DENTAL PROCEDURE. 4  capsule 0   cyclobenzaprine (FLEXERIL) 5 MG tablet TAKE 1 TABLET BY MOUTH AT BEDTIME AS NEEDED FOR MUSCLE SPASMS 30 tablet 3   EPINEPHrine 0.3 mg/0.3 mL IJ SOAJ injection Inject 0.3 mLs (0.3 mg total) into the muscle as needed for anaphylaxis. 2 each prn   ergocalciferol (VITAMIN D2) 1.25 MG (50000 UT) capsule Take by mouth.     estradiol (VIVELLE-DOT) 0.075 MG/24HR APPLY 1 PATCH ONTO THE SKIN 2 TIMES A WEEK 24 patch 4   etanercept (ENBREL SURECLICK) 50 MG/ML injection Inject 1 pen (38m) into the skin once a week 4 mL 3   gabapentin (NEURONTIN) 300 MG capsule TAKE 1 CAPSULE BY MOUTH EVERY MORNING AND 2 CAPSULES EVERY NIGHT AT BEDTIME 270 capsule 3   hydrochlorothiazide (HYDRODIURIL) 25 MG tablet TAKE 1 TABLET BY MOUTH ONCE A DAY 90 tablet 0   leflunomide (ARAVA) 10 MG tablet Take one tablet (10 mg dose) by mouth daily. 30 tablet 2   losartan (COZAAR) 25 MG tablet TAKE 1 TABLET BY MOUTH ONCE A DAY 90 tablet 2   Melatonin 3 MG TABS Take by mouth as directed.     metoprolol succinate (TOPROL-XL) 25 MG 24 hr tablet Take 1 tablet (25 mg total) by mouth daily as needed. 90 tablet 2   Multiple Vitamins-Minerals (MULTIVITAMIN ADULT PO) Take by mouth daily.     SUMAtriptan (IMITREX) 20 MG/ACT nasal spray PLACE 1 SPRAY INTO THE NOSE EVERY 2 HOURS AS NEEDED FOR HEADACHE 6 each 0   Vilazodone HCl (VIIBRYD) 20 MG TABS Take 1 tablet (20 mg total) by mouth daily. **PATIENT NEEDS OFFICE VISIT FOR ADDITIONAL REFILLS** 30 tablet 0   No current facility-administered medications for this visit.    Allergies  Allergen Reactions   Succinylcholine Anaphylaxis   Dilaudid [Hydromorphone Hcl] Nausea And Vomiting   Nsaids Other (See Comments)    Trigger colits      Review of Systems: Constitutional:  No  fever, no chills, No recent illness, No unintentional weight changes. No significant fatigue.  HEENT: No  headache, no vision change, no hearing change, No sore throat, No  sinus pressure Cardiac: No  chest  pain, No  pressure, + palpitations, No  Orthopnea Respiratory:  No  shortness of breath. No  Cough Gastrointestinal: No  abdominal pain, No  nausea, No  vomiting,  No  blood in stool, No  diarrhea, No  constipation  Musculoskeletal: No new myalgia/arthralgia Skin: No  Rash, No other wounds/concerning lesions Genitourinary: No  incontinence, No  abnormal genital bleeding, No abnormal genital discharge Hem/Onc: No  easy bruising/bleeding, No  abnormal lymph node Endocrine: No cold intolerance,  No heat intolerance. No polyuria/polydipsia/polyphagia  Neurologic: No  weakness, No  dizziness, No  slurred speech/focal weakness/facial droop Psychiatric: No  concerns with depression, No  concerns with anxiety, + sleep problems, No mood problems  Exam:  BP 119/79   Pulse 68   Temp 97.8 F (36.6 C)   Ht _0  (1.727 m)   Wt 252 lb 6.4 oz (114.5 kg)   LMP  (  LMP Unknown) Comment: hystrectomy  SpO2 97%   BMI 38.38 kg/m  Constitutional: VS see above. General Appearance: alert, well-developed, well-nourished, NAD Eyes: Normal lids and conjunctive, non-icteric sclera Ears, Nose, Mouth, Throat: MMM, Normal external inspection ears/nares/mouth/lips/gums. TM normal bilaterally.   Neck: No masses, trachea midline. No thyroid enlargement. No tenderness/mass appreciated. No lymphadenopathy Respiratory: Normal respiratory effort. no wheeze, no rhonchi, no rales Cardiovascular: S1/S2 normal, no murmur, no rub/gallop auscultated. RRR. No lower extremity edema. Pedal pulse II/IV bilaterally PT. No carotid bruit or JVD. No abdominal aortic bruit. Gastrointestinal: Nontender, no masses. No hepatomegaly, no splenomegaly. No hernia appreciated. Bowel sounds normal. Rectal exam deferred.  Musculoskeletal: Gait normal. No clubbing/cyanosis of digits.  Neurological: Normal balance/coordination. No tremor. No cranial nerve deficit on limited exam. Motor and sensation intact and symmetric. Cerebellar reflexes intact.   Skin: warm, dry, intact. No rash/ulcer. No concerning nevi or subq nodules on limited exam.   Psychiatric: Normal judgment/insight. Normal mood and affect. Oriented x3.   No results found for this or any previous visit (from the past 72 hour(s)).  No results found.   ASSESSMENT/PLAN:   1. Annual physical exam Checking CBC with differential, CMP, and lipid panel today.  Up-to-date on other preventative care. - CBC with Differential/Platelet - COMPLETE METABOLIC PANEL WITH GFR - Lipid panel  Orders Placed This Encounter  Procedures   CBC with Differential/Platelet   COMPLETE METABOLIC PANEL WITH GFR   Lipid panel    No orders of the defined types were placed in this encounter.   Patient Instructions  Preventive Care 12-5 Years Old, Female Preventive care refers to lifestyle choices and visits with your health care provider that can promote health and wellness. This includes: A yearly physical exam. This is also called an annual wellness visit. Regular dental and eye exams. Immunizations. Screening for certain conditions. Healthy lifestyle choices, such as: Eating a healthy diet. Getting regular exercise. Not using drugs or products that contain nicotine and tobacco. Limiting alcohol use. What can I expect for my preventive care visit? Physical exam Your health care provider will check your: Height and weight. These may be used to calculate your BMI (body mass index). BMI is a measurement that tells if you are at a healthy weight. Heart rate and blood pressure. Body temperature. Skin for abnormal spots. Counseling Your health care provider may ask you questions about your: Past medical problems. Family's medical history. Alcohol, tobacco, and drug use. Emotional well-being. Home life and relationship well-being. Sexual activity. Diet, exercise, and sleep habits. Work and work Statistician. Access to firearms. Method of birth control. Menstrual cycle. Pregnancy  history. What immunizations do I need?  Vaccines are usually given at various ages, according to a schedule. Your health care provider will recommend vaccines for you based on your age, medicalhistory, and lifestyle or other factors, such as travel or where you work. What tests do I need? Blood tests Lipid and cholesterol levels. These may be checked every 5 years, or more often if you are over 61 years old. Hepatitis C test. Hepatitis B test. Screening Lung cancer screening. You may have this screening every year starting at age 77 if you have a 30-pack-year history of smoking and currently smoke or have quit within the past 15 years. Colorectal cancer screening. All adults should have this screening starting at age 28 and continuing until age 32. Your health care provider may recommend screening at age 55 if you are at increased risk. You will have tests every  1-10 years, depending on your results and the type of screening test. Diabetes screening. This is done by checking your blood sugar (glucose) after you have not eaten for a while (fasting). You may have this done every 1-3 years. Mammogram. This may be done every 1-2 years. Talk with your health care provider about when you should start having regular mammograms. This may depend on whether you have a family history of breast cancer. BRCA-related cancer screening. This may be done if you have a family history of breast, ovarian, tubal, or peritoneal cancers. Pelvic exam and Pap test. This may be done every 3 years starting at age 3. Starting at age 1, this may be done every 5 years if you have a Pap test in combination with an HPV test. Other tests STD (sexually transmitted disease) testing, if you are at risk. Bone density scan. This is done to screen for osteoporosis. You may have this scan if you are at high risk for osteoporosis. Talk with your health care provider about your test results, treatment options,and if necessary,  the need for more tests. Follow these instructions at home: Eating and drinking  Eat a diet that includes fresh fruits and vegetables, whole grains, lean protein, and low-fat dairy products. Take vitamin and mineral supplements as recommended by your health care provider. Do not drink alcohol if: Your health care provider tells you not to drink. You are pregnant, may be pregnant, or are planning to become pregnant. If you drink alcohol: Limit how much you have to 0-1 drink a day. Be aware of how much alcohol is in your drink. In the [place] one drink equals one 12 oz bottle of beer (355 mL), one 5 oz glass of wine (148 mL), or one 1 oz glass of hard liquor (44 mL).  Lifestyle Take daily care of your teeth and gums. Brush your teeth every morning and night with fluoride toothpaste. Floss one time each day. Stay active. Exercise for at least 30 minutes 5 or more days each week. Do not use any products that contain nicotine or tobacco, such as cigarettes, e-cigarettes, and chewing tobacco. If you need help quitting, ask your health care provider. Do not use drugs. If you are sexually active, practice safe sex. Use a condom or other form of protection to prevent STIs (sexually transmitted infections). If you do not wish to become pregnant, use a form of birth control. If you plan to become pregnant, see your health care provider for a prepregnancy visit. If told by your health care provider, take low-dose aspirin daily starting at age 61. Find healthy ways to cope with stress, such as: Meditation, yoga, or listening to music. Journaling. Talking to a trusted person. Spending time with friends and family. Safety Always wear your seat belt while driving or riding in a vehicle. Do not drive: If you have been drinking alcohol. Do not ride with someone who has been drinking. When you are tired or distracted. While texting. Wear a helmet and other protective equipment during sports  activities. If you have firearms in your house, make sure you follow all gun safety procedures. What's next? Visit your health care provider once a year for an annual wellness visit. Ask your health care provider how often you should have your eyes and teeth checked. Stay up to date on all vaccines. This information is not intended to replace advice given to you by your health care provider. Make sure you discuss any questions you have with your  healthcare provider. Document Revised: 04/02/2020 Document Reviewed: 03/10/2018 Elsevier Patient Education  [place].  Follow-up plan: Return in about 1 year (around 01/08/2022) for annual physical exam or sooner if needed.  Clearnce Sorrel, DNP, APRN, FNP-BC [facility]

## 2021-01-09 LAB — COMPLETE METABOLIC PANEL WITH GFR
AG Ratio: 1.7 (calc) (ref 1.0–2.5)
ALT: 22 U/L (ref 6–29)
AST: 20 U/L (ref 10–35)
Albumin: 4.3 g/dL (ref 3.6–5.1)
Alkaline phosphatase (APISO): 46 U/L (ref 37–153)
BUN: 11 mg/dL (ref 7–25)
CO2: 31 mmol/L (ref 20–32)
Calcium: 9.3 mg/dL (ref 8.6–10.4)
Chloride: 101 mmol/L (ref 98–110)
Creat: 0.67 mg/dL (ref 0.50–0.99)
GFR, Est African American: 111 mL/min/{1.73_m2} (ref >=60)
GFR, Est Non African American: 96 mL/min/{1.73_m2} (ref >=60)
Globulin: 2.5 g/dL (calc) (ref 1.9–3.7)
Glucose, Bld: 80 mg/dL (ref 65–99)
Potassium: 3.9 mmol/L (ref 3.5–5.3)
Sodium: 140 mmol/L (ref 135–146)
Total Bilirubin: 0.4 mg/dL (ref 0.2–1.2)
Total Protein: 6.8 g/dL (ref 6.1–8.1)

## 2021-01-09 LAB — CBC WITH DIFFERENTIAL/PLATELET
Absolute Monocytes: 577 cells/uL (ref 200–950)
Basophils Absolute: 37 cells/uL (ref 0–200)
Basophils Relative: 0.5 %
Eosinophils Absolute: 89 cells/uL (ref 15–500)
Eosinophils Relative: 1.2 %
HCT: 43.8 % (ref 35.0–45.0)
Hemoglobin: 14.3 g/dL (ref 11.7–15.5)
Lymphs Abs: 2287 cells/uL (ref 850–3900)
MCH: 30.6 pg (ref 27.0–33.0)
MCHC: 32.6 g/dL (ref 32.0–36.0)
MCV: 93.6 fL (ref 80.0–100.0)
MPV: 10.6 fL (ref 7.5–12.5)
Monocytes Relative: 7.8 %
Neutro Abs: 4410 cells/uL (ref 1500–7800)
Neutrophils Relative %: 59.6 %
Platelets: 328 10*3/uL (ref 140–400)
RBC: 4.68 10*6/uL (ref 3.80–5.10)
RDW: 11.7 % (ref 11.0–15.0)
Total Lymphocyte: 30.9 %
WBC: 7.4 10*3/uL (ref 3.8–10.8)

## 2021-01-09 LAB — LIPID PANEL
Cholesterol: 188 mg/dL (ref <200)
HDL: 51 mg/dL (ref >=50)
LDL Cholesterol (Calc): 106 mg/dL (calc) — ABNORMAL HIGH
Non-HDL Cholesterol (Calc): 137 mg/dL (calc) — ABNORMAL HIGH (ref <130)
Total CHOL/HDL Ratio: 3.7 (calc) (ref <5.0)
Triglycerides: 192 mg/dL — ABNORMAL HIGH (ref <150)

## 2021-01-15 ENCOUNTER — Encounter: Payer: Self-pay | Admitting: Rehabilitative and Restorative Service Providers"

## 2021-01-20 ENCOUNTER — Other Ambulatory Visit: Payer: Self-pay | Admitting: Physician Assistant

## 2021-01-20 ENCOUNTER — Other Ambulatory Visit (HOSPITAL_COMMUNITY): Payer: Self-pay

## 2021-01-20 ENCOUNTER — Other Ambulatory Visit: Payer: Self-pay

## 2021-01-20 ENCOUNTER — Other Ambulatory Visit: Payer: Self-pay | Admitting: Family Medicine

## 2021-01-20 ENCOUNTER — Encounter: Payer: Self-pay | Admitting: Family Medicine

## 2021-01-20 MED ORDER — VILAZODONE HCL 20 MG PO TABS
1.0000 | ORAL_TABLET | Freq: Every day | ORAL | 0 refills | Status: DC
  Filled 2021-01-20: qty 30, 30d supply, fill #0

## 2021-01-20 MED ORDER — HYDROCHLOROTHIAZIDE 25 MG PO TABS
ORAL_TABLET | Freq: Every day | ORAL | 0 refills | Status: DC
  Filled 2021-01-20: qty 90, 90d supply, fill #0

## 2021-01-20 MED FILL — Losartan Potassium Tab 25 MG: ORAL | 90 days supply | Qty: 90 | Fill #1 | Status: AC

## 2021-01-21 ENCOUNTER — Telehealth: Payer: 59 | Admitting: Family Medicine

## 2021-01-21 ENCOUNTER — Telehealth: Payer: Self-pay | Admitting: Family Medicine

## 2021-01-21 ENCOUNTER — Other Ambulatory Visit (HOSPITAL_COMMUNITY): Payer: Self-pay

## 2021-01-21 DIAGNOSIS — R197 Diarrhea, unspecified: Secondary | ICD-10-CM | POA: Diagnosis not present

## 2021-01-21 DIAGNOSIS — R110 Nausea: Secondary | ICD-10-CM | POA: Diagnosis not present

## 2021-01-21 MED ORDER — ESTRADIOL 0.075 MG/24HR TD PTTW
MEDICATED_PATCH | TRANSDERMAL | 0 refills | Status: DC
  Filled 2021-01-21: qty 24, 84d supply, fill #0

## 2021-01-21 MED ORDER — VILAZODONE HCL 20 MG PO TABS
1.0000 | ORAL_TABLET | Freq: Every day | ORAL | 0 refills | Status: DC
  Filled 2021-01-21 – 2021-01-23 (×5): qty 90, 90d supply, fill #0

## 2021-01-21 MED ORDER — ONDANSETRON HCL 4 MG PO TABS: 4.0000 mg | ORAL_TABLET | Freq: Three times a day (TID) | ORAL | 0 refills | Status: DC | PRN

## 2021-01-21 NOTE — Progress Notes (Signed)
We are sorry that you are not feeling well.  Here is how we plan to help!  Based on what you have shared with me it looks like you have Acute Viral Diarrhea/Nausea  Most cases of acute diarrhea are due to infections with virus and bacteria and are self-limited conditions lasting less than 14 days.  For your symptoms you may take Imodium 2 mg tablets that are over the counter at your local pharmacy. Take two tablet now and then one after each loose stool up to 6 a day.  Antibiotics are not needed for most people with diarrhea.  Optional: Zofran 4 mg 1 tablet every 8 hours as needed for nausea and vomiting  HOME CARE We recommend changing your diet to help with your symptoms for the next few days. Drink plenty of fluids that contain water salt and sugar. Sports drinks such as Gatorade may help.  You may try broths, soups, bananas, applesauce, soft breads, mashed potatoes or crackers.  You are considered infectious for as long as the diarrhea continues. Hand washing or use of alcohol based hand sanitizers is recommend. It is best to stay out of work or school until your symptoms stop.   GET HELP RIGHT AWAY If you have dark yellow colored urine or do not pass urine frequently you should drink more fluids.   If your symptoms worsen  If you feel like you are going to pass out (faint) You have a new problem  MAKE SURE YOU  Understand these instructions. Will watch your condition. Will get help right away if you are not doing well or get worse.  Thank you for choosing an e-visit.  Your e-visit answers were reviewed by a board certified advanced clinical practitioner to complete your personal care plan. Depending upon the condition, your plan could have included both over the counter or prescription medications.  Please review your pharmacy choice. Make sure the pharmacy is open so you can pick up prescription now. If there is a problem, you may contact your provider through [employer]  and have the prescription routed to another pharmacy.  Your safety is important to [place]. If you have drug allergies check your prescription carefully.   For the next 24 hours you can use MyChart to ask questions about today's visit, request a non-urgent call back, or ask for a work or school excuse. You will get an email in the next two days asking about your experience. I hope that your e-visit has been valuable and will speed your recovery.  I provided 5 minutes of non face-to-face time during this encounter for chart review, medication and order placement, as well as and documentation.    [name], NP 01/21/2021  12:24 PM

## 2021-01-21 NOTE — Telephone Encounter (Signed)
Received notification from pharmacy that there generic Viibyrd is not covered but brand is. New rx sent.

## 2021-01-22 ENCOUNTER — Other Ambulatory Visit (HOSPITAL_COMMUNITY): Payer: Self-pay

## 2021-01-24 ENCOUNTER — Encounter: Payer: Self-pay | Admitting: Family Medicine

## 2021-01-24 ENCOUNTER — Other Ambulatory Visit (HOSPITAL_COMMUNITY): Payer: Self-pay

## 2021-01-24 ENCOUNTER — Ambulatory Visit: Payer: 59 | Admitting: Family Medicine

## 2021-01-24 ENCOUNTER — Other Ambulatory Visit: Payer: Self-pay

## 2021-01-24 VITALS — BP 123/67 | HR 76 | Ht 68.0 in | Wt 252.0 lb

## 2021-01-24 DIAGNOSIS — Z6838 Body mass index (BMI) 38.0-38.9, adult: Secondary | ICD-10-CM

## 2021-01-24 DIAGNOSIS — I493 Ventricular premature depolarization: Secondary | ICD-10-CM

## 2021-01-24 DIAGNOSIS — G43809 Other migraine, not intractable, without status migrainosus: Secondary | ICD-10-CM

## 2021-01-24 DIAGNOSIS — Z7689 Persons encountering health services in other specified circumstances: Secondary | ICD-10-CM

## 2021-01-24 DIAGNOSIS — I10 Essential (primary) hypertension: Secondary | ICD-10-CM | POA: Diagnosis not present

## 2021-01-24 DIAGNOSIS — F418 Other specified anxiety disorders: Secondary | ICD-10-CM

## 2021-01-24 MED ORDER — SEMAGLUTIDE-WEIGHT MANAGEMENT 0.5 MG/0.5ML ~~LOC~~ SOAJ
0.5000 mg | SUBCUTANEOUS | 0 refills | Status: DC
  Filled 2021-01-24: qty 2, 28d supply, fill #0

## 2021-01-24 MED ORDER — OZEMPIC (0.25 OR 0.5 MG/DOSE) 2 MG/1.5ML ~~LOC~~ SOPN
0.2500 mg | PEN_INJECTOR | SUBCUTANEOUS | 0 refills | Status: DC
  Filled 2021-01-24: qty 1.5, 56d supply, fill #0

## 2021-01-24 MED ORDER — SEMAGLUTIDE-WEIGHT MANAGEMENT 0.25 MG/0.5ML ~~LOC~~ SOAJ
0.2500 mg | SUBCUTANEOUS | 0 refills | Status: AC
Start: 2021-01-24 — End: 2021-02-21
  Filled 2021-01-24: qty 2, 28d supply, fill #0

## 2021-01-24 MED ORDER — VILAZODONE HCL 20 MG PO TABS: 1.0000 | ORAL_TABLET | Freq: Every day | ORAL | 0 refills | Status: DC

## 2021-01-24 NOTE — Assessment & Plan Note (Addendum)
Discussed options.  Visit #:1 Starting Weight: 252 lbs.    Current weight: 252 lbs.  Previous weight: Change in weight: Goal weight: Dietary goals: work on portion control. Using MyFitness Pal 1400 cal /day Exercise goals: stretching keep up daily short walks, RA is limitng factor Medication: Start Wegovy 0.25mg , then 0.5mg  next month.  Monitor for nausea.  Follow-up and referrals:8 weeks.

## 2021-01-24 NOTE — Assessment & Plan Note (Signed)
Continue daily beta-blocker.

## 2021-01-24 NOTE — Assessment & Plan Note (Signed)
Well controlled. Continue current regimen. Follow up in  6 mo  

## 2021-01-24 NOTE — Assessment & Plan Note (Signed)
Stable. Doing well with no recent flares.  I do think the beta-blocker is likely helping.

## 2021-01-24 NOTE — Progress Notes (Signed)
Established Patient Office Visit  Subjective:  Patient ID: Kathy Clark, female    DOB: 1960/08/06  Age: 60 y.o. MRN: [id number]  CC:  Chief Complaint  Patient presents with   Follow-up      HPI Kathy Clark presents for   Hypertension- Pt denies chest pain, SOB, dizziness, or heart palpitations.  Taking meds as directed w/o problems.  Denies medication side effects.    F/U migraines - doing well well but also on BBlocker for her palpitations.  Hasn't had a bad HA in quite some time.    F/U depression with anixiety -overall she is doing well.  Her Viibryd has shipped out from her mail order so she should be saving receiving it soon.  She is happy with her current regimen.  She denies any depressive or anxiety symptoms currently.  F/U RA -she was recently started on Areva and told to monitor her blood pressures carefully.  Thus far she has not seen any problems.   She is still struggling with weight loss.  She was going to healthy weight and wellness for quite some time and just feels like her weight would fluctuate up and down.  More recently she is try to get back on track by cutting back on sugars and carbs but she still struggles with an increased appetite she has been doing short walks during her workday and when her husband gets home to try to stay active though she is limited somewhat by her rheumatoid which has been flaring more recently since she came off of methotrexate which was actually making her feel worse.  PVCs-she did recently stop her beta-blocker to see if her PVCs had improved but unfortunately within a few days she started getting symptoms again and so had to restart it.  Past Medical History:  Diagnosis Date   Anxiety    Back pain    Complication of anesthesia    allergy to Succinylcholine   Depression    Essential hypertension, benign    Gastritis    GERD (gastroesophageal reflux disease)    Joint pain    Lymphocytic colitis    microscopic    Migraines    Osteoarthritis    Sciatica    right leg   Swelling    feet, left foot    Past Surgical History:  Procedure Laterality Date   ABDOMINAL HYSTERECTOMY  07/2005   [place] Bunionectomy Left 09/18/2016   Left Foot   BACK SURGERY     KNEE SURGERY  2006   left; multiple   microdiscetomy  2005   SHOULDER SURGERY  07/22/2009   right   SKIN CANCER EXCISION     eyelid   TOTAL ABDOMINAL HYSTERECTOMY     TOTAL KNEE ARTHROPLASTY  07/27/2011   Procedure: TOTAL KNEE ARTHROPLASTY;  Surgeon: Gearlean Alf;  Location: [facility];  Service: Orthopedics;  Laterality: Left;    Family History  Problem Relation Age of Onset   Breast cancer Other    Tongue cancer Mother    Breast cancer Mother    Anxiety disorder Mother    Depression Mother    Heart disease Father    Hypertension Father    Hyperlipidemia Father    Stroke Paternal Grandmother    Colon cancer Neg Hx     Social History   Socioeconomic History   Marital status: Married    Spouse name: John   Number of children: 2   Years of education: Not on file  Highest education level: Not on file  Occupational History   Occupation: Programmer, multimedia: [employer]  Tobacco Use   Smoking status: Never   Smokeless tobacco: Never  Vaping Use   Vaping Use: Never used  Substance and Sexual Activity   Alcohol use: Yes    Comment: occasionally   Drug use: No   Sexual activity: Yes    Birth control/protection: Surgical    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  Other Topics Concern   Not on file  Social History Narrative   Not on file   Social Determinants of Health   Financial Resource Strain: Not on file  Food Insecurity: Not on file  Transportation Needs: Not on file  Physical Activity: Not on file  Stress: Not on file  Social Connections: Not on file  Intimate Partner Violence: Not on file    Outpatient Medications Prior to Visit  Medication Sig Dispense Refill   [place] Medication Name: Please decrease CPAP setting to 8 cm of water pressure and fax [place] a download after 5 days.  Is having a lot of difficulty with air leaks is working to try to reduce her pressure to see if she is still being adequately treated but with fewer leaks.FAx to [place] 1 Units 0   Calcium Carbonate-Vitamin D (CALTRATE 600+D PO) Take 1 tablet by mouth daily.     cyclobenzaprine (FLEXERIL) 5 MG tablet TAKE 1 TABLET BY MOUTH AT BEDTIME AS NEEDED FOR MUSCLE SPASMS 30 tablet 3   EPINEPHrine 0.3 mg/0.3 mL IJ SOAJ injection Inject 0.3 mLs (0.3 mg total) into the muscle as needed for anaphylaxis. 2 each prn   ergocalciferol (VITAMIN D2) 1.25 MG (50000 UT) capsule Take by mouth.     estradiol (VIVELLE-DOT) 0.075 MG/24HR Apply 1 patch twice a week by transdermal route. 24 patch 0   etanercept (ENBREL SURECLICK) 50 MG/ML injection Inject 1 pen (50mg ) into the skin once a week 4 mL 3   gabapentin (NEURONTIN) 300 MG capsule TAKE 1 CAPSULE BY MOUTH EVERY MORNING AND 2 CAPSULES EVERY NIGHT AT BEDTIME 270 capsule 3   hydrochlorothiazide (HYDRODIURIL) 25 MG tablet Take 1 tablet by mouth daily. 90 tablet 0   leflunomide (ARAVA) 10 MG tablet Take one tablet (10 mg dose) by mouth daily. 30 tablet 2   losartan (COZAAR) 25 MG tablet TAKE 1 TABLET BY MOUTH ONCE A DAY 90 tablet 2   Melatonin 3 MG TABS Take by mouth as directed.     metoprolol succinate (TOPROL-XL) 25 MG 24 hr tablet Take 1 tablet (25 mg total) by mouth daily as needed. 90 tablet 2   Multiple Vitamins-Minerals (MULTIVITAMIN ADULT PO) Take by mouth daily.     ondansetron (ZOFRAN) 4 MG tablet Take 1 tablet (4 mg total) by mouth every 8 (eight) hours as needed for nausea or vomiting. 15 tablet 0   SUMAtriptan (IMITREX) 20 MG/ACT nasal spray PLACE 1 SPRAY INTO THE NOSE EVERY 2 HOURS AS NEEDED FOR HEADACHE 6 each 0   clindamycin (CLEOCIN) 300 MG capsule TAKE 2 CAPSULES BY MOUTH ONCE FOR 1 DOSE 1 HOUR PRIOR TO DENTAL  PROCEDURE. 4 capsule 0   estradiol (VIVELLE-DOT) 0.075 MG/24HR APPLY 1 PATCH ONTO THE SKIN 2 TIMES A WEEK 24 patch 4   Vilazodone HCl 20 MG TABS Take 1 tablet (20 mg total) by mouth daily. 90 tablet 0   No facility-administered medications prior to visit.  Allergies  Allergen Reactions   Succinylcholine Anaphylaxis   Dilaudid [Hydromorphone Hcl] Nausea And Vomiting   Nsaids Other (See Comments)    Trigger colits   Plaquenil [Hydroxychloroquine] Other (See Comments)    Chronic tinnitus    ROS Review of Systems    Objective:    Physical Exam  BP 123/67   Pulse 76   Ht 5\' 8"  (1.727 m)   Wt 252 lb (114.3 kg)   LMP  (LMP Unknown) Comment: hystrectomy  SpO2 97%   BMI 38.32 kg/m  Wt Readings from Last 3 Encounters:  01/24/21 252 lb (114.3 kg)  01/08/21 252 lb 6.4 oz (114.5 kg)  12/04/20 250 lb 3.2 oz (113.5 kg)     There are no preventive care reminders to display for this patient.  There are no preventive care reminders to display for this patient.  Lab Results  Component Value Date   TSH 2.330 05/29/2020   Lab Results  Component Value Date   WBC 7.4 01/08/2021   HGB 14.3 01/08/2021   HCT 43.8 01/08/2021   MCV 93.6 01/08/2021   PLT 328 01/08/2021   Lab Results  Component Value Date   NA 140 01/08/2021   K 3.9 01/08/2021   CO2 31 01/08/2021   GLUCOSE 80 01/08/2021   BUN 11 01/08/2021   CREATININE 0.67 01/08/2021   BILITOT 0.4 01/08/2021   ALKPHOS 66 11/08/2019   AST 20 01/08/2021   ALT 22 01/08/2021   PROT 6.8 01/08/2021   ALBUMIN 4.3 11/08/2019   CALCIUM 9.3 01/08/2021   Lab Results  Component Value Date   CHOL 188 01/08/2021   Lab Results  Component Value Date   HDL 51 01/08/2021   Lab Results  Component Value Date   LDLCALC 106 (H) 01/08/2021   Lab Results  Component Value Date   TRIG 192 (H) 01/08/2021   Lab Results  Component Value Date   CHOLHDL 3.7 01/08/2021   Lab Results  Component Value Date   HGBA1C 5.2 11/08/2019       Assessment & Plan:   Problem List Items Addressed This Visit       Cardiovascular and Mediastinum   PVC (premature ventricular contraction)    Continue daily beta-blocker.       Migraine    Stable. Doing well with no recent flares.  I do think the beta-blocker is likely helping.       Relevant Medications   Vilazodone HCl 20 MG TABS   HYPERTENSION, BENIGN ESSENTIAL - Primary    Well controlled. Continue current regimen. Follow up in  6 mo          Other   Encounter for weight management    Discussed options.  Visit #:1 Starting Weight: 252 lbs.    Current weight: 252 lbs.  Previous weight: Change in weight: Goal weight: Dietary goals: work on portion control. Using MyFitness Pal 1400 cal /day Exercise goals: stretching keep up daily short walks, RA is limitng factor Medication: Start Wegovy 0.25mg , then 0.5mg  next month.  Monitor for nausea.  Follow-up and referrals:8 weeks.          Relevant Medications   Semaglutide-Weight Management 0.25 MG/0.5ML SOAJ   Semaglutide-Weight Management 0.5 MG/0.5ML SOAJ (Start on 02/22/2021)   Anxious depression    Stable on current regimen.         Relevant Medications   Vilazodone HCl 20 MG TABS   Other Visit Diagnoses     BMI 38.0-38.9,adult  Meds ordered this encounter  Medications   Vilazodone HCl 20 MG TABS    Sig: Take 1 tablet (20 mg total) by mouth daily.    Dispense:  90 tablet    Refill:  0   Semaglutide-Weight Management 0.25 MG/0.5ML SOAJ    Sig: Inject 0.25 mg into the skin once a week for 28 days.    Dispense:  2 mL    Refill:  0    Pls mail to patient   Semaglutide-Weight Management 0.5 MG/0.5ML SOAJ    Sig: Inject 0.5 mg into the skin once a week for 28 days.    Dispense:  2 mL    Refill:  0    Please mail to patient.     Follow-up: Return in about 6 months (around 07/27/2021) for Hypertension/Mood.    Beatrice Lecher, MD

## 2021-01-24 NOTE — Assessment & Plan Note (Signed)
Stable on current regimen   

## 2021-01-27 ENCOUNTER — Other Ambulatory Visit (HOSPITAL_COMMUNITY): Payer: Self-pay

## 2021-01-28 ENCOUNTER — Other Ambulatory Visit (HOSPITAL_COMMUNITY): Payer: Self-pay

## 2021-01-30 ENCOUNTER — Other Ambulatory Visit (HOSPITAL_COMMUNITY): Payer: Self-pay

## 2021-02-03 ENCOUNTER — Other Ambulatory Visit (HOSPITAL_COMMUNITY): Payer: Self-pay

## 2021-02-04 ENCOUNTER — Other Ambulatory Visit (HOSPITAL_COMMUNITY): Payer: Self-pay

## 2021-02-05 ENCOUNTER — Other Ambulatory Visit (HOSPITAL_COMMUNITY): Payer: Self-pay

## 2021-02-11 DIAGNOSIS — H52223 Regular astigmatism, bilateral: Secondary | ICD-10-CM | POA: Diagnosis not present

## 2021-02-11 DIAGNOSIS — H5203 Hypermetropia, bilateral: Secondary | ICD-10-CM | POA: Diagnosis not present

## 2021-02-11 DIAGNOSIS — H524 Presbyopia: Secondary | ICD-10-CM | POA: Diagnosis not present

## 2021-02-12 ENCOUNTER — Other Ambulatory Visit: Payer: Self-pay | Admitting: Family Medicine

## 2021-02-12 ENCOUNTER — Other Ambulatory Visit (HOSPITAL_COMMUNITY): Payer: Self-pay

## 2021-02-13 ENCOUNTER — Other Ambulatory Visit (HOSPITAL_COMMUNITY): Payer: Self-pay

## 2021-02-14 ENCOUNTER — Other Ambulatory Visit (HOSPITAL_COMMUNITY): Payer: Self-pay

## 2021-02-17 ENCOUNTER — Other Ambulatory Visit (HOSPITAL_COMMUNITY): Payer: Self-pay

## 2021-02-17 ENCOUNTER — Encounter: Payer: Self-pay | Admitting: Family Medicine

## 2021-02-17 ENCOUNTER — Other Ambulatory Visit: Payer: Self-pay | Admitting: Family Medicine

## 2021-02-17 DIAGNOSIS — Z79899 Other long term (current) drug therapy: Secondary | ICD-10-CM | POA: Diagnosis not present

## 2021-02-17 DIAGNOSIS — E559 Vitamin D deficiency, unspecified: Secondary | ICD-10-CM | POA: Diagnosis not present

## 2021-02-17 DIAGNOSIS — M0609 Rheumatoid arthritis without rheumatoid factor, multiple sites: Secondary | ICD-10-CM | POA: Diagnosis not present

## 2021-02-18 ENCOUNTER — Other Ambulatory Visit (HOSPITAL_COMMUNITY): Payer: Self-pay

## 2021-02-19 ENCOUNTER — Other Ambulatory Visit (HOSPITAL_COMMUNITY): Payer: Self-pay

## 2021-02-19 ENCOUNTER — Other Ambulatory Visit: Payer: Self-pay | Admitting: Family Medicine

## 2021-02-19 ENCOUNTER — Other Ambulatory Visit: Payer: Self-pay | Admitting: *Deleted

## 2021-02-19 MED FILL — Metoprolol Succinate Tab ER 24HR 25 MG (Tartrate Equiv): ORAL | 90 days supply | Qty: 90 | Fill #0 | Status: AC

## 2021-02-19 MED FILL — Cyclobenzaprine HCl Tab 5 MG: ORAL | 30 days supply | Qty: 30 | Fill #0 | Status: AC

## 2021-02-21 ENCOUNTER — Other Ambulatory Visit (HOSPITAL_COMMUNITY): Payer: Self-pay

## 2021-02-24 ENCOUNTER — Other Ambulatory Visit (HOSPITAL_COMMUNITY): Payer: Self-pay

## 2021-02-27 ENCOUNTER — Other Ambulatory Visit (HOSPITAL_COMMUNITY): Payer: Self-pay

## 2021-03-03 ENCOUNTER — Other Ambulatory Visit (HOSPITAL_COMMUNITY): Payer: Self-pay

## 2021-03-06 ENCOUNTER — Telehealth (INDEPENDENT_AMBULATORY_CARE_PROVIDER_SITE_OTHER): Payer: 59 | Admitting: Family Medicine

## 2021-03-06 ENCOUNTER — Other Ambulatory Visit (HOSPITAL_COMMUNITY): Payer: Self-pay

## 2021-03-06 ENCOUNTER — Encounter: Payer: Self-pay | Admitting: Family Medicine

## 2021-03-06 VITALS — BP 128/78 | HR 76 | Ht 68.0 in | Wt 242.0 lb

## 2021-03-06 DIAGNOSIS — I10 Essential (primary) hypertension: Secondary | ICD-10-CM

## 2021-03-06 DIAGNOSIS — I493 Ventricular premature depolarization: Secondary | ICD-10-CM

## 2021-03-06 DIAGNOSIS — M0609 Rheumatoid arthritis without rheumatoid factor, multiple sites: Secondary | ICD-10-CM | POA: Diagnosis not present

## 2021-03-06 DIAGNOSIS — Z7689 Persons encountering health services in other specified circumstances: Secondary | ICD-10-CM

## 2021-03-06 DIAGNOSIS — T50905A Adverse effect of unspecified drugs, medicaments and biological substances, initial encounter: Secondary | ICD-10-CM

## 2021-03-06 MED ORDER — VERAPAMIL HCL ER 120 MG PO TBCR
120.0000 mg | EXTENDED_RELEASE_TABLET | Freq: Every day | ORAL | 0 refills | Status: DC
  Filled 2021-03-06: qty 90, 90d supply, fill #0

## 2021-03-06 MED ORDER — SEMAGLUTIDE (1 MG/DOSE) 4 MG/3ML ~~LOC~~ SOPN
1.0000 mg | PEN_INJECTOR | SUBCUTANEOUS | 1 refills | Status: DC
  Filled 2021-03-06: qty 3, 28d supply, fill #0

## 2021-03-06 NOTE — Assessment & Plan Note (Signed)
Discussed options to help rate control.  Discontinue metoprolol and try a calcium channel blocker.  She had already tried propranolol in the past for her migraines and did not feel good on that medication either.  If she tolerates a calcium channel blocker then we can always titrate to make sure that the symptoms are controlled as well as her blood pressure and maybe even get her off of the losartan to simplify the medication regimen.

## 2021-03-06 NOTE — Assessment & Plan Note (Signed)
Now on Enbrel for her rheumatoid arthritis.  Doing well so far.

## 2021-03-06 NOTE — Assessment & Plan Note (Signed)
  Visit #:2 Starting Weight: 252 lbs.    Current weight: 242 lbs.  Previous weight: 252 lbs Change in weight: Down 10 lbs Goal weight: 185 lb,  Dietary goals: cut out sugar and cut back on carbs.  Using MyFitness Pal 1400 cal /day Exercise goals: stretching keep up daily short walks, RA is limitng factor Medication: Continue 0.'5mg'$ , then 1 mg next month.   Follow-up and referrals:8 weeks.

## 2021-03-06 NOTE — Assessment & Plan Note (Signed)
Blood pressure looks phenomenal today but we are to switch medication so she will need to keep an eye on her pressure.  She does have a home BP cuff.

## 2021-03-06 NOTE — Progress Notes (Signed)
Virtual Visit via Video Note  I connected with Kathy Clark on 03/06/21 at  2:40 PM EDT by a video enabled telemedicine application and verified that I am speaking with the correct person using two identifiers.   I discussed the limitations of evaluation and management by telemedicine and the availability of in person appointments. The patient expressed understanding and agreed to proceed.  Patient location: at home Provider location: in office  Subjective:    CC: PVCs  HPI: Stopped her metoprolol for 2 weeks to see if PVCs had resolved. They stared again and had to restart it. Feels the metoprolol affects her energy and stamina.  She notices a big difference in her energy levels if she skips her dose versus if she takes it.  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  Weight management-because of the backorder on Wegovy we will send in prescription for Ozempic.  So far she is actually tolerating it well.  She is about halfway through the 0.5 mg dose.  She has lost 12 lbs so far.  Using her Fitness Pal to track calories.  Trying to stay active.  She has been trying to take short walks.  She is unable to do long walks because of her rheumatoid.  She is hoping to see if there is some wear in her local area that she can get access for pool exercise that she would love to swim laps.   Past medical history, Surgical history, Family history not pertinant except as noted below, Social history, Allergies, and medications have been entered into the medical record, reviewed, and corrections made.    Objective:    General: Speaking clearly in complete sentences without any shortness of breath.  Alert and oriented x3.  Normal judgment. No apparent acute distress.    Impression and Recommendations:    Encounter for weight management  Visit #:2 Starting Weight: 252 lbs.     Current weight: 242 lbs.  Previous weight: 252 lbs Change in  weight: Down 10 lbs Goal weight: 185 lb,  Dietary goals: cut out sugar and cut back on carbs.  Using MyFitness Pal 1400 cal /day Exercise goals: stretching keep up daily short walks, RA is limitng factor Medication: Continue 0.'5mg'$ , then 1 mg next month.   Follow-up and referrals:8 weeks.    HYPERTENSION, BENIGN ESSENTIAL Blood pressure looks phenomenal today but we are to switch medication so she will need to keep an eye on her pressure.  She does have a home BP cuff.  PVC (premature ventricular contraction) Discussed options to help rate control.  Discontinue metoprolol and try a calcium channel blocker.  She had already tried propranolol in the past for her migraines and did not feel good on that medication either.  If she tolerates a calcium channel blocker then we can always titrate to make sure that the symptoms are controlled as well as her blood pressure and maybe even get her off of the losartan to simplify the medication regimen.  Rheumatoid arthritis of multiple sites with negative rheumatoid factor ([place]) Now on Enbrel for her rheumatoid arthritis.  Doing well so far.    No orders of the defined types were placed in this encounter.   Meds ordered this encounter  Medications   verapamil (CALAN-SR) 120 MG CR tablet    Sig: Take 1 tablet (120 mg total) by mouth at bedtime.    Dispense:  90 tablet  Refill:  0    Please mail to patient   Semaglutide, 1 MG/DOSE, 4 MG/3ML SOPN    Sig: Inject 1 mg as directed once a week.    Dispense:  3 mL    Refill:  1    Please mail to patient    I discussed the assessment and treatment plan with the patient. The patient was provided an opportunity to ask questions and all were answered. The patient agreed with the plan and demonstrated an understanding of the instructions.   The patient was advised to call back or seek an in-person evaluation if the symptoms worsen or if the condition fails to improve as anticipated.   Beatrice Lecher, MD

## 2021-03-07 ENCOUNTER — Other Ambulatory Visit: Payer: Self-pay | Admitting: Family Medicine

## 2021-03-07 ENCOUNTER — Other Ambulatory Visit (HOSPITAL_COMMUNITY): Payer: Self-pay

## 2021-03-07 DIAGNOSIS — G5603 Carpal tunnel syndrome, bilateral upper limbs: Secondary | ICD-10-CM

## 2021-03-07 MED ORDER — GABAPENTIN 300 MG PO CAPS
ORAL_CAPSULE | ORAL | 3 refills | Status: DC
  Filled 2021-03-07: qty 270, 90d supply, fill #0
  Filled 2021-06-06: qty 270, 90d supply, fill #1
  Filled 2021-09-01: qty 270, 90d supply, fill #2
  Filled 2021-11-24: qty 270, 90d supply, fill #3

## 2021-03-10 ENCOUNTER — Other Ambulatory Visit (HOSPITAL_COMMUNITY): Payer: Self-pay

## 2021-03-10 DIAGNOSIS — R3915 Urgency of urination: Secondary | ICD-10-CM | POA: Diagnosis not present

## 2021-03-10 DIAGNOSIS — Z90711 Acquired absence of uterus with remaining cervical stump: Secondary | ICD-10-CM | POA: Diagnosis not present

## 2021-03-10 DIAGNOSIS — Z01411 Encounter for gynecological examination (general) (routine) with abnormal findings: Secondary | ICD-10-CM | POA: Diagnosis not present

## 2021-03-10 DIAGNOSIS — Z113 Encounter for screening for infections with a predominantly sexual mode of transmission: Secondary | ICD-10-CM | POA: Diagnosis not present

## 2021-03-10 DIAGNOSIS — Z6837 Body mass index (BMI) 37.0-37.9, adult: Secondary | ICD-10-CM | POA: Diagnosis not present

## 2021-03-10 DIAGNOSIS — Z01419 Encounter for gynecological examination (general) (routine) without abnormal findings: Secondary | ICD-10-CM | POA: Diagnosis not present

## 2021-03-10 DIAGNOSIS — Z124 Encounter for screening for malignant neoplasm of cervix: Secondary | ICD-10-CM | POA: Diagnosis not present

## 2021-03-10 DIAGNOSIS — Z79890 Hormone replacement therapy (postmenopausal): Secondary | ICD-10-CM | POA: Diagnosis not present

## 2021-03-10 DIAGNOSIS — N951 Menopausal and female climacteric states: Secondary | ICD-10-CM | POA: Diagnosis not present

## 2021-03-10 MED ORDER — ESTRADIOL 0.075 MG/24HR TD PTTW
MEDICATED_PATCH | TRANSDERMAL | 3 refills | Status: DC
  Filled 2021-03-10 – 2021-04-28 (×2): qty 24, 84d supply, fill #0
  Filled 2021-07-19: qty 24, 84d supply, fill #1
  Filled 2021-10-15: qty 24, 84d supply, fill #2
  Filled 2022-01-06: qty 24, 84d supply, fill #3

## 2021-03-11 ENCOUNTER — Other Ambulatory Visit (HOSPITAL_COMMUNITY): Payer: Self-pay

## 2021-03-21 ENCOUNTER — Encounter: Payer: Self-pay | Admitting: Family Medicine

## 2021-03-21 DIAGNOSIS — R110 Nausea: Secondary | ICD-10-CM

## 2021-03-21 MED ORDER — ONDANSETRON HCL 4 MG PO TABS
4.0000 mg | ORAL_TABLET | Freq: Three times a day (TID) | ORAL | 0 refills | Status: DC | PRN
Start: 2021-03-21 — End: 2022-11-19
  Filled 2021-03-21: qty 15, 5d supply, fill #0

## 2021-03-21 NOTE — Telephone Encounter (Signed)
Meds ordered this encounter  Medications   ondansetron (ZOFRAN) 4 MG tablet    Sig: Take 1 tablet (4 mg total) by mouth every 8 (eight) hours as needed for nausea or vomiting.    Dispense:  15 tablet    Refill:  0

## 2021-03-22 ENCOUNTER — Other Ambulatory Visit (HOSPITAL_COMMUNITY): Payer: Self-pay

## 2021-03-24 ENCOUNTER — Other Ambulatory Visit (HOSPITAL_COMMUNITY): Payer: Self-pay

## 2021-03-24 MED ORDER — LEFLUNOMIDE 10 MG PO TABS
ORAL_TABLET | ORAL | 3 refills | Status: DC
  Filled 2021-03-24: qty 30, 30d supply, fill #0
  Filled 2021-04-28: qty 30, 30d supply, fill #1

## 2021-03-27 ENCOUNTER — Other Ambulatory Visit (HOSPITAL_COMMUNITY): Payer: Self-pay

## 2021-03-27 ENCOUNTER — Ambulatory Visit: Payer: 59 | Admitting: Family Medicine

## 2021-03-27 DIAGNOSIS — G4733 Obstructive sleep apnea (adult) (pediatric): Secondary | ICD-10-CM | POA: Diagnosis not present

## 2021-04-01 ENCOUNTER — Encounter: Payer: Self-pay | Admitting: Family Medicine

## 2021-04-04 ENCOUNTER — Other Ambulatory Visit (HOSPITAL_COMMUNITY): Payer: Self-pay

## 2021-04-04 ENCOUNTER — Encounter: Payer: Self-pay | Admitting: Family Medicine

## 2021-04-04 ENCOUNTER — Telehealth (INDEPENDENT_AMBULATORY_CARE_PROVIDER_SITE_OTHER): Payer: 59 | Admitting: Family Medicine

## 2021-04-04 VITALS — BP 126/82 | HR 82 | Temp 97.6°F | Ht 68.0 in | Wt 239.0 lb

## 2021-04-04 DIAGNOSIS — G479 Sleep disorder, unspecified: Secondary | ICD-10-CM

## 2021-04-04 DIAGNOSIS — Z7689 Persons encountering health services in other specified circumstances: Secondary | ICD-10-CM

## 2021-04-04 MED ORDER — DAYVIGO 5 MG PO TABS
5.0000 mg | ORAL_TABLET | Freq: Every day | ORAL | 0 refills | Status: DC
  Filled 2021-04-04: qty 30, 30d supply, fill #0

## 2021-04-04 MED ORDER — SEMAGLUTIDE (2 MG/DOSE) 8 MG/3ML ~~LOC~~ SOPN
2.0000 mg | PEN_INJECTOR | SUBCUTANEOUS | 0 refills | Status: DC
  Filled 2021-04-04: qty 3, 28d supply, fill #0

## 2021-04-04 NOTE — Progress Notes (Signed)
Pt reports that she has been doing well. She does occasionally get some heartburn and upset stomach.   She asked about getting the Omicron vaccine

## 2021-04-04 NOTE — Progress Notes (Signed)
Virtual Visit via Video Note  I connected with Kathy Clark on 04/04/21 at  2:40 PM EDT by a video enabled telemedicine application and verified that I am speaking with the correct person using two identifiers.   I discussed the limitations of evaluation and management by telemedicine and the availability of in person appointments. The patient expressed understanding and agreed to proceed.  Patient location: at home Provider location: in office  Subjective:    CC: Weight Mgt.    HPI: Follow-up weight management-she is currently on Ozempic.  Pt reports that she has been doing well. She does occasionally get some heartburn and upset stomach.  She has been able to manage that with some Tums and Phenergan at times.  Using My Fitness Pal.  1600 cal per day.  Med is helping her not overeat. Has cut out fast food.  Getting protein in with eat meal.  Still getting some walks in for exercise.  She really would like to get into some type of water exercise.  [facility]  Was taking 1/2 flexeril at bedtime to sleep, once she wakes up she is up for an hour.   Usually gets up to urinate at least once or twice a night.  She does try to limit the fluid intake before bedtime to keep that to a minimum.  She says the Flexeril did help but she was noticing that she was feeling really tired especially the first couple hours of the morning and having hard time getting motivated.  She is tried multiple sleep aids in the past including Benadryl, Ambien, Belsomra.  Most of them tend to cause excess grogginess and sedation in the mornings.  She asked about getting the Omicron vaccine   Past medical history, Surgical history, Family history not pertinant except as noted below, Social history, Allergies, and medications have been entered into the medical record, reviewed, and corrections made.    Objective:    General: Speaking clearly in complete sentences without any shortness of  breath.  Alert and oriented x3.  Normal judgment. No apparent acute distress.    Impression and Recommendations:    Encounter for weight management   Visit #:3 Starting Weight: 252 lbs.     Current weight: 239 lbs.  Previous weight: 242 lbs Change in weight: Down 3 lbs Goal weight: 185 lb  Dietary goals: Protein with each meal, portion control  Using MyFitness Pal 1600 cal /day Exercise goals: stretching, keep up daily short walks, RA is limitng factor. Look into water exercise options. Medication: Increase to 2 mg next week.   Follow-up and referrals:6-8 weeks.    Sleeping difficulties He is actually tried several things.  We did discuss maybe a trial of Dayvigo.  It is in the same category as the [place] Walk.  Maybe she would tolerate a low dose without having excess grogginess and sedation.  Did encourage her to look online for coupon card.  Did encourage her to schedule her by Valent COVID-vaccine as well as her flu shot she plans on getting that next month.  We did discuss the shingles vaccine little bit as well.  No orders of the defined types were placed in this encounter.   Meds ordered this encounter  Medications   Lemborexant (DAYVIGO) 5 MG TABS    Sig: Take 1 capsule by mouth daily.    Dispense:  30 tablet    Refill:  0   Semaglutide, 2 MG/DOSE, 8 MG/3ML  SOPN    Sig: Inject 2 mg as directed once a week.    Dispense:  3 mL    Refill:  0    I discussed the assessment and treatment plan with the patient. The patient was provided an opportunity to ask questions and all were answered. The patient agreed with the plan and demonstrated an understanding of the instructions.   The patient was advised to call back or seek an in-person evaluation if the symptoms worsen or if the condition fails to improve as anticipated.   Beatrice Lecher, MD

## 2021-04-04 NOTE — Assessment & Plan Note (Signed)
  Visit #:3 Starting RCVELF:810 lbs.  Current weight:239 lbs. Previous weight: 242 lbs Change in weight: Down 3 lbs Goal weight: 185 lb  Dietary goals:Protein with each meal, portion control  Using MyFitness Pal 1600 cal /day Exercise goals:stretching, keep up daily short walks, RA is limitng factor. Look into water exercise options. Medication:Increase to 2 mg next week.  Follow-up and referrals:6-8 weeks.

## 2021-04-04 NOTE — Assessment & Plan Note (Signed)
He is actually tried several things.  We did discuss maybe a trial of Dayvigo.  It is in the same category as the [place].  Maybe she would tolerate a low dose without having excess grogginess and sedation.  Did encourage her to look online for coupon card.

## 2021-04-07 ENCOUNTER — Other Ambulatory Visit (HOSPITAL_COMMUNITY): Payer: Self-pay

## 2021-04-24 ENCOUNTER — Other Ambulatory Visit (HOSPITAL_COMMUNITY): Payer: Self-pay

## 2021-04-24 ENCOUNTER — Other Ambulatory Visit: Payer: Self-pay | Admitting: Internal Medicine

## 2021-04-28 ENCOUNTER — Other Ambulatory Visit: Payer: Self-pay | Admitting: Family Medicine

## 2021-04-28 ENCOUNTER — Other Ambulatory Visit (HOSPITAL_COMMUNITY): Payer: Self-pay

## 2021-04-28 ENCOUNTER — Other Ambulatory Visit: Payer: Self-pay | Admitting: Pharmacist

## 2021-04-28 MED ORDER — ENBREL SURECLICK 50 MG/ML ~~LOC~~ SOAJ: SUBCUTANEOUS | 2 refills | Status: DC

## 2021-04-28 MED ORDER — ENBREL SURECLICK 50 MG/ML ~~LOC~~ SOAJ
SUBCUTANEOUS | 2 refills | Status: DC
  Filled 2021-04-28: qty 4, 28d supply, fill #0
  Filled 2021-05-22: qty 4, 28d supply, fill #1
  Filled 2021-06-19: qty 4, 28d supply, fill #2

## 2021-04-28 MED FILL — Hydrochlorothiazide Tab 25 MG: ORAL | 90 days supply | Qty: 90 | Fill #0 | Status: AC

## 2021-04-28 MED FILL — Losartan Potassium Tab 25 MG: ORAL | 90 days supply | Qty: 90 | Fill #2 | Status: AC

## 2021-04-30 ENCOUNTER — Encounter: Payer: Self-pay | Admitting: Family Medicine

## 2021-04-30 ENCOUNTER — Other Ambulatory Visit (HOSPITAL_COMMUNITY): Payer: Self-pay

## 2021-04-30 ENCOUNTER — Telehealth (INDEPENDENT_AMBULATORY_CARE_PROVIDER_SITE_OTHER): Payer: 59 | Admitting: Family Medicine

## 2021-04-30 ENCOUNTER — Telehealth: Payer: Self-pay | Admitting: Family Medicine

## 2021-04-30 VITALS — BP 123/84 | HR 83 | Temp 98.1°F | Wt 235.0 lb

## 2021-04-30 DIAGNOSIS — Z7689 Persons encountering health services in other specified circumstances: Secondary | ICD-10-CM | POA: Diagnosis not present

## 2021-04-30 DIAGNOSIS — G479 Sleep disorder, unspecified: Secondary | ICD-10-CM | POA: Diagnosis not present

## 2021-04-30 MED ORDER — SEMAGLUTIDE (1 MG/DOSE) 4 MG/3ML ~~LOC~~ SOPN
1.0000 mg | PEN_INJECTOR | SUBCUTANEOUS | 0 refills | Status: DC
  Filled 2021-04-30: qty 3, 28d supply, fill #0
  Filled 2021-05-27: qty 3, 28d supply, fill #1

## 2021-04-30 NOTE — Assessment & Plan Note (Signed)
Will see if we can try half a tab Will check with our clinical pharmacist.  See if still helps but less daytime sedation.

## 2021-04-30 NOTE — Telephone Encounter (Signed)
Please see MyChart note sent to patient.   @TODAY @

## 2021-04-30 NOTE — Progress Notes (Signed)
Virtual Visit via Video Note  I connected with Kathy Clark on 04/30/21 at  9:50 AM EDT by a video enabled telemedicine application and verified that I am speaking with the correct person using two identifiers.   I discussed the limitations of evaluation and management by telemedicine and the availability of in person appointments. The patient expressed understanding and agreed to proceed.  Patient location: at home Provider location: in office  Subjective:    CC: F/u Wt mgt, new sleep med  HPI: Pt reports that she is having more GI problems. Nausea, loose stools, indigestion on the inc dose of Ozempic. Stuck with it all month but sxs are not improving.  She feels the new sleep med is working well to help her fall and stay asleep but feels more tired during the daytime  takes med immediately before bedtime.     Past medical history, Surgical history, Family history not pertinant except as noted below, Social history, Allergies, and medications have been entered into the medical record, reviewed, and corrections made.    Objective:    General: Speaking clearly in complete sentences without any shortness of breath.  Alert and oriented x3.  Normal judgment. No apparent acute distress.    Impression and Recommendations:    Encounter for weight management Visit #:4 Starting Weight: 252 lbs.     Current weight: 235 lbs.  Previous weight: 239 lbs Change in weight: Down 4 lbs from last OV Goal weight: 185 lb  Dietary goals: Protein with each meal, portion control  Using MyFitness Pal 1400-1600 cal /day Exercise goals: stretching, keep up daily short walks, RA is limitng factor. Walking 20 minutes a day, sometimes more than once a day. Medication: Decrease dose back to 1 mg next week bc of nausea and G Iupset.   Follow-up and referrals:8 weeks.  Sleeping difficulties Will see if we can try half a tab Will check with our clinical pharmacist.  See if still helps but less daytime  sedation.    No orders of the defined types were placed in this encounter.   Meds ordered this encounter  Medications   Semaglutide, 1 MG/DOSE, 4 MG/3ML SOPN    Sig: Inject 1 mg once a week as directed.    Dispense:  9 mL    Refill:  0     I discussed the assessment and treatment plan with the patient. The patient was provided an opportunity to ask questions and all were answered. The patient agreed with the plan and demonstrated an understanding of the instructions.   The patient was advised to call back or seek an in-person evaluation if the symptoms worsen or if the condition fails to improve as anticipated.   Beatrice Lecher, MD

## 2021-04-30 NOTE — Assessment & Plan Note (Addendum)
Visit #:4 Starting UYQIHK:742 lbs.  Current weight:235 lbs. Previous weight:239 lbs Change in weight:Down 4 lbs from last OV Goal weight:185 lb Dietary goals:Protein with each meal, portion control  Using MyFitness Pal 1400-1600 cal /day Exercise goals:stretching, keep up daily short walks, RA is limitng factor. Walking 20 minutes a day, sometimes more than once a day. Medication:Decrease dose back to 1mg  next week bc of nausea and G Iupset.  Follow-up and referrals:8 weeks.

## 2021-04-30 NOTE — Progress Notes (Signed)
Pt reports that she is having more GI problems. Nausea, loose stools, indigestion and would like to speak with pcp about this.  Also would like to discuss the sleep medication. She stated that it causes her to be really sedated.

## 2021-05-01 ENCOUNTER — Telehealth: Payer: 59 | Admitting: Family Medicine

## 2021-05-02 ENCOUNTER — Other Ambulatory Visit (HOSPITAL_COMMUNITY): Payer: Self-pay

## 2021-05-02 MED ORDER — INFLUENZA VAC SPLIT QUAD 0.5 ML IM SUSY
0.5000 mL | PREFILLED_SYRINGE | INTRAMUSCULAR | 0 refills | Status: DC
  Filled 2021-05-02: qty 0.5, 1d supply, fill #0

## 2021-05-03 ENCOUNTER — Other Ambulatory Visit: Payer: Self-pay | Admitting: Family Medicine

## 2021-05-03 ENCOUNTER — Other Ambulatory Visit (HOSPITAL_COMMUNITY): Payer: Self-pay

## 2021-05-05 ENCOUNTER — Encounter: Payer: Self-pay | Admitting: Family Medicine

## 2021-05-05 ENCOUNTER — Other Ambulatory Visit: Payer: Self-pay | Admitting: Family Medicine

## 2021-05-05 ENCOUNTER — Other Ambulatory Visit (HOSPITAL_COMMUNITY): Payer: Self-pay

## 2021-05-05 MED ORDER — VILAZODONE HCL 20 MG PO TABS
1.0000 | ORAL_TABLET | Freq: Every day | ORAL | 0 refills | Status: DC
  Filled 2021-05-05: qty 90, 90d supply, fill #0

## 2021-05-06 ENCOUNTER — Other Ambulatory Visit (HOSPITAL_COMMUNITY): Payer: Self-pay

## 2021-05-06 MED ORDER — METHYLPREDNISOLONE 4 MG PO TBPK
ORAL_TABLET | ORAL | 0 refills | Status: DC
  Filled 2021-05-06: qty 21, 6d supply, fill #0

## 2021-05-22 ENCOUNTER — Other Ambulatory Visit (HOSPITAL_COMMUNITY): Payer: Self-pay

## 2021-05-23 ENCOUNTER — Encounter: Payer: Self-pay | Admitting: Family Medicine

## 2021-05-23 ENCOUNTER — Other Ambulatory Visit: Payer: Self-pay

## 2021-05-23 ENCOUNTER — Ambulatory Visit: Payer: 59 | Admitting: Family Medicine

## 2021-05-23 VITALS — BP 125/76 | HR 79 | Ht 68.0 in | Wt 237.0 lb

## 2021-05-23 DIAGNOSIS — R42 Dizziness and giddiness: Secondary | ICD-10-CM

## 2021-05-23 DIAGNOSIS — R209 Unspecified disturbances of skin sensation: Secondary | ICD-10-CM

## 2021-05-23 DIAGNOSIS — H9313 Tinnitus, bilateral: Secondary | ICD-10-CM | POA: Diagnosis not present

## 2021-05-23 NOTE — Telephone Encounter (Signed)
Patient scheduled.

## 2021-05-23 NOTE — Progress Notes (Signed)
ys  Acute Office Visit  Subjective:    Patient ID: Kathy Clark, female    DOB: [birth year], 60 y.o.   MRN: [id number]  Chief Complaint  Patient presents with   Dizziness     HPI Patient is in today for dizziness that started about 2 days ago.  Has felt HA. Yesterday felt nauseated and even vomited.  Worse with bending over.  She says even just laying in bed and turning over she would just feel like she was going to roll off the bed even though she knew she was not.  She says it is about the third episode of the last couple of months which is why she came in today.  She has some the home exercises on her own in the past and has used meclizine which does help.  She has had a headache over the last couple days with this 1.  She reports that years ago before she even had children she had a really severe episode that led to ENT referral and some imaging she was told that there was some type of injury to her in her ear at that time.  Has had ringing in her ears and even stopped her Plaquenil about 6 months ago but it is still happening.  It has not really gotten better.  Also co/o of an electrical sensation in the legs that almost feels like a buzzing. Started last week after traveling.  She said she really cannot quite describe it its not really painful as it is just a sensation of almost like a weakness or a vibration.  No recent changes the only new medication really is her Dayvigo.  Past Surgical History:  Procedure Laterality Date   ABDOMINAL HYSTERECTOMY  07/2005   [place] Bunionectomy Left 09/18/2016   Left Foot   BACK SURGERY     KNEE SURGERY  2006   left; multiple   microdiscetomy  2005   SHOULDER SURGERY  07/22/2009   right   SKIN CANCER EXCISION     eyelid   TOTAL ABDOMINAL HYSTERECTOMY     TOTAL KNEE ARTHROPLASTY  07/27/2011   Procedure: TOTAL KNEE ARTHROPLASTY;  Surgeon: Gearlean Alf;  Location: [facility];  Service: Orthopedics;  Laterality: Left;    Family History   Problem Relation Age of Onset   Breast cancer Other    Tongue cancer Mother    Breast cancer Mother    Anxiety disorder Mother    Depression Mother    Heart disease Father    Hypertension Father    Hyperlipidemia Father    Stroke Paternal Grandmother    Colon cancer Neg Hx    Outpatient Medications Prior to Visit  Medication Sig Dispense Refill   AMBULATORY NON FORMULARY MEDICATION Medication Name: Please decrease CPAP setting to 8 cm of water pressure and fax [place] a download after 5 days.  Is having a lot of difficulty with air leaks is working to try to reduce her pressure to see if she is still being adequately treated but with fewer leaks.FAx to [place] 1 Units 0   Calcium Carbonate-Vitamin D (CALTRATE 600+D PO) Take 1 tablet by mouth daily.     cyclobenzaprine (FLEXERIL) 5 MG tablet TAKE 1 TABLET BY MOUTH AT BEDTIME AS NEEDED FOR MUSCLE SPASMS 30 tablet 3   EPINEPHrine 0.3 mg/0.3 mL IJ SOAJ injection Inject 0.3 mLs (0.3 mg total) into the muscle as needed for anaphylaxis. 2 each prn   ergocalciferol (VITAMIN D2)  1.25 MG (50000 UT) capsule Take by mouth.     estradiol (VIVELLE-DOT) 0.075 MG/24HR Apply 1 patch twice a week 24 patch 3   etanercept (ENBREL SURECLICK) 50 MG/ML injection Inject 1 pen (50mg ) into the skin once a week 4 mL 2   gabapentin (NEURONTIN) 300 MG capsule TAKE 1 CAPSULE BY MOUTH EVERY MORNING AND 2 CAPSULES EVERY NIGHT AT BEDTIME 270 capsule 3   hydrochlorothiazide (HYDRODIURIL) 25 MG tablet Take 1 tablet by mouth daily. 90 tablet 0   influenza vac split quadrivalent PF (FLUARIX) 0.5 ML injection Inject 0.5 mLs into the muscle. 0.5 mL 0   leflunomide (ARAVA) 10 MG tablet Take one tablet (10 mg dose) by mouth daily. 30 tablet 3   Lemborexant (DAYVIGO) 5 MG TABS Take 1 tablet by mouth daily. 30 tablet 0   losartan (COZAAR) 25 MG tablet TAKE 1 TABLET BY MOUTH ONCE A DAY 90 tablet 2   Melatonin 3 MG TABS Take by mouth as directed.     Multiple  Vitamins-Minerals (MULTIVITAMIN ADULT PO) Take by mouth daily.     ondansetron (ZOFRAN) 4 MG tablet Take 1 tablet (4 mg total) by mouth every 8 (eight) hours as needed for nausea or vomiting. 15 tablet 0   Semaglutide, 1 MG/DOSE, 4 MG/3ML SOPN Inject 1 mg once a week as directed. 9 mL 0   SUMAtriptan (IMITREX) 20 MG/ACT nasal spray PLACE 1 SPRAY INTO THE NOSE EVERY 2 HOURS AS NEEDED FOR HEADACHE 6 each 0   verapamil (CALAN-SR) 120 MG CR tablet Take 1 tablet (120 mg total) by mouth at bedtime. 90 tablet 0   Vilazodone HCl 20 MG TABS Take 1 tablet (20 mg total) by mouth daily. 90 tablet 0   methylPREDNISolone (MEDROL DOSEPAK) 4 MG TBPK tablet Take according to package directions 21 tablet 0   No facility-administered medications prior to visit.    Allergies  Allergen Reactions   Succinylcholine Anaphylaxis   Dilaudid [Hydromorphone Hcl] Nausea And Vomiting   Nsaids Other (See Comments)    Trigger colits   Plaquenil [Hydroxychloroquine] Other (See Comments)    Chronic tinnitus    Review of Systems     Objective:    Physical Exam Constitutional:      Appearance: She is well-developed.  HENT:     Head: Normocephalic and atraumatic.     Right Ear: Tympanic membrane, ear canal and external ear normal.     Left Ear: Tympanic membrane, ear canal and external ear normal.     Nose: Nose normal.  Eyes:     Conjunctiva/sclera: Conjunctivae normal.     Pupils: Pupils are equal, round, and reactive to light.  Neck:     Thyroid: No thyromegaly.  Cardiovascular:     Rate and Rhythm: Normal rate and regular rhythm.     Heart sounds: Normal heart sounds.  Pulmonary:     Effort: Pulmonary effort is normal.     Breath sounds: Normal breath sounds. No wheezing.  Musculoskeletal:     Cervical back: Neck supple.  Lymphadenopathy:     Cervical: No cervical adenopathy.  Skin:    General: Skin is warm and dry.     Comments: Get a disc call back maneuver.  Though she did feel dizzy sitting up.   She also had a little bit of nystagmus just when we were checking extraocular movements.  Neurological:     Mental Status: She is alert and oriented to person, place, and time.    BP  125/76   Pulse 79   Ht 5\' 8"  (1.727 m)   Wt 237 lb (107.5 kg)   LMP  (LMP Unknown) Comment: hystrectomy  SpO2 99%   BMI 36.04 kg/m  Wt Readings from Last 3 Encounters:  05/23/21 237 lb (107.5 kg)  04/30/21 235 lb (106.6 kg)  04/04/21 239 lb (108.4 kg)    Health Maintenance Due  Topic Date Due   Zoster Vaccines- Shingrix (1 of 2) Never done   MAMMOGRAM  03/31/2021    There are no preventive care reminders to display for this patient.   Lab Results  Component Value Date   TSH 2.330 05/29/2020   Lab Results  Component Value Date   WBC 7.4 01/08/2021   HGB 14.3 01/08/2021   HCT 43.8 01/08/2021   MCV 93.6 01/08/2021   PLT 328 01/08/2021   Lab Results  Component Value Date   NA 140 01/08/2021   K 3.9 01/08/2021   CO2 31 01/08/2021   GLUCOSE 80 01/08/2021   BUN 11 01/08/2021   CREATININE 0.67 01/08/2021   BILITOT 0.4 01/08/2021   ALKPHOS 66 11/08/2019   AST 20 01/08/2021   ALT 22 01/08/2021   PROT 6.8 01/08/2021   ALBUMIN 4.3 11/08/2019   CALCIUM 9.3 01/08/2021   Lab Results  Component Value Date   CHOL 188 01/08/2021   Lab Results  Component Value Date   HDL 51 01/08/2021   Lab Results  Component Value Date   LDLCALC 106 (H) 01/08/2021   Lab Results  Component Value Date   TRIG 192 (H) 01/08/2021   Lab Results  Component Value Date   CHOLHDL 3.7 01/08/2021   Lab Results  Component Value Date   HGBA1C 5.2 11/08/2019       Assessment & Plan:   Problem List Items Addressed This Visit   None Visit Diagnoses     Dizziness    -  Primary   Relevant Orders   CBC   COMPLETE METABOLIC PANEL WITH GFR   CK (Creatine Kinase)   Ambulatory referral to Physical Therapy   Abnormal sensation of leg       Relevant Orders   CBC   COMPLETE METABOLIC PANEL WITH GFR    CK (Creatine Kinase)   Tinnitus aurium, bilateral          Dizziness-most consistent with benign positional vertigo.  She had a little bit of nystagmus just when we were checking her extraocular movements but none with the actual Dix-Hallpike maneuver though she did feel dizzy sitting up from the lying down position.  We discussed treatment with vestibular rehab and if not improving or symptoms recur may consider MRI for further work-up.  Abnormal sensation of legs it seems to be constant and it affects them bilaterally from the knees down.  Unclear etiology at this point.  Certainly consider medication side effect only thing that is new is Dayvigo we will get some blood work today just to make sure that electrolytes and muscle enzyme level is normal.  Chronic ear ringing-consider referral to ENT for further evaluation.  No orders of the defined types were placed in this encounter.    Beatrice Lecher, MD

## 2021-05-24 LAB — COMPLETE METABOLIC PANEL WITH GFR
AG Ratio: 1.8 (calc) (ref 1.0–2.5)
ALT: 81 U/L — ABNORMAL HIGH (ref 6–29)
AST: 40 U/L — ABNORMAL HIGH (ref 10–35)
Albumin: 4.2 g/dL (ref 3.6–5.1)
Alkaline phosphatase (APISO): 61 U/L (ref 37–153)
BUN: 11 mg/dL (ref 7–25)
CO2: 26 mmol/L (ref 20–32)
Calcium: 9.6 mg/dL (ref 8.6–10.4)
Chloride: 103 mmol/L (ref 98–110)
Creat: 0.67 mg/dL (ref 0.50–1.05)
Globulin: 2.4 g/dL (calc) (ref 1.9–3.7)
Glucose, Bld: 84 mg/dL (ref 65–99)
Potassium: 3.8 mmol/L (ref 3.5–5.3)
Sodium: 139 mmol/L (ref 135–146)
Total Bilirubin: 0.6 mg/dL (ref 0.2–1.2)
Total Protein: 6.6 g/dL (ref 6.1–8.1)
eGFR: 99 mL/min/{1.73_m2} (ref >=60)

## 2021-05-24 LAB — CK: Total CK: 21 U/L — ABNORMAL LOW (ref 29–143)

## 2021-05-24 LAB — CBC
HCT: 44.1 % (ref 35.0–45.0)
Hemoglobin: 15.1 g/dL (ref 11.7–15.5)
MCH: 31 pg (ref 27.0–33.0)
MCHC: 34.2 g/dL (ref 32.0–36.0)
MCV: 90.6 fL (ref 80.0–100.0)
MPV: 11.2 fL (ref 7.5–12.5)
Platelets: 314 10*3/uL (ref 140–400)
RBC: 4.87 10*6/uL (ref 3.80–5.10)
RDW: 12.4 % (ref 11.0–15.0)
WBC: 6.4 10*3/uL (ref 3.8–10.8)

## 2021-05-26 ENCOUNTER — Other Ambulatory Visit (HOSPITAL_COMMUNITY): Payer: Self-pay

## 2021-05-26 NOTE — Progress Notes (Signed)
Hi Kathy Clark, your blood count is normal no sign of anemia or infection.  Your kidney function is stable.  Your liver enzymes have jumped back up, similar to last fall.  Muscle enzyme is normal no sign of excess muscle breakdown.  Hopefully rehab has  contacted you to schedule you for vestibular rehab.  We will see if this is improving over the next couple of weeks and if not then we may consider further work-up with possible MRI.  For the ear ringing I can always refer you to ENT for further work-up.  I am happy to make that referral if you would like.  Just let me know if you have a preference for location or provider.

## 2021-05-27 ENCOUNTER — Other Ambulatory Visit: Payer: Self-pay | Admitting: Family Medicine

## 2021-05-27 DIAGNOSIS — I493 Ventricular premature depolarization: Secondary | ICD-10-CM

## 2021-05-28 ENCOUNTER — Other Ambulatory Visit (HOSPITAL_COMMUNITY): Payer: Self-pay

## 2021-05-28 ENCOUNTER — Encounter: Payer: Self-pay | Admitting: Family Medicine

## 2021-05-28 MED ORDER — VERAPAMIL HCL ER 120 MG PO TBCR
120.0000 mg | EXTENDED_RELEASE_TABLET | Freq: Every day | ORAL | 1 refills | Status: DC
  Filled 2021-05-28: qty 90, 90d supply, fill #0
  Filled 2021-08-24: qty 90, 90d supply, fill #1

## 2021-05-28 MED ORDER — DAYVIGO 5 MG PO TABS
5.0000 mg | ORAL_TABLET | Freq: Every day | ORAL | 1 refills | Status: DC
  Filled 2021-05-28: qty 90, 90d supply, fill #0

## 2021-05-29 ENCOUNTER — Other Ambulatory Visit: Payer: Self-pay

## 2021-05-29 ENCOUNTER — Ambulatory Visit (INDEPENDENT_AMBULATORY_CARE_PROVIDER_SITE_OTHER): Payer: 59 | Admitting: Physical Therapy

## 2021-05-29 DIAGNOSIS — R2681 Unsteadiness on feet: Secondary | ICD-10-CM

## 2021-05-29 DIAGNOSIS — R42 Dizziness and giddiness: Secondary | ICD-10-CM

## 2021-05-29 DIAGNOSIS — R2689 Other abnormalities of gait and mobility: Secondary | ICD-10-CM | POA: Diagnosis not present

## 2021-05-29 NOTE — Patient Instructions (Signed)
Access Code: [email or web address] URL: [email or web address] Date: 05/29/2021 Prepared by: Estill Bamberg April Thurnell Garbe  Exercises Seated Gaze Stabilization with Head Rotation - 1 x daily - 7 x weekly - 3 sets - 20-30 sec hold Seated Gaze Stabilization with Head Nod - 1 x daily - 7 x weekly - 3 sets - 20-30 sec hold Seated Horizontal Smooth Pursuit - 1 x daily - 7 x weekly - 3 sets - 20-30 sec hold Seated Vertical Smooth Pursuit - 1 x daily - 7 x weekly - 3 sets - 20-30 sec hold Standing Horizontal Saccades - 1 x daily - 7 x weekly - 3 sets - 20-30 sec hold Standing Vertical Saccades - 1 x daily - 7 x weekly - 3 sets - 20-30 sec hold

## 2021-05-29 NOTE — Addendum Note (Signed)
Addended by: Colbert Ewing MARIE L on: 05/29/2021 05:35 PM   Modules accepted: Orders

## 2021-05-29 NOTE — Therapy (Signed)
[address] [place], [place], [address] Phone: [phone]   Fax:  [phone]  Physical Therapy Evaluation  Patient Details  Name: Kathy Clark MRN: [id number] Date of Birth: April 03, 1961 Referring Provider (PT): Hali Marry, MD   Encounter Date: 05/29/2021   PT End of Session - 05/29/21 1714     Visit Number 1    Number of Visits 6    Date for PT Re-Evaluation 07/10/21    Authorization Type [employer]    PT Start Time 9470    PT Stop Time 9628    PT Time Calculation (min) 45 min    Activity Tolerance Patient tolerated treatment well    Behavior During Therapy [facility] for tasks assessed/performed             Past Medical History:  Diagnosis Date   Anxiety    Back pain    Complication of anesthesia    allergy to Succinylcholine   Depression    Essential hypertension, benign    Gastritis    GERD (gastroesophageal reflux disease)    Joint pain    Lymphocytic colitis    microscopic   Migraines    Osteoarthritis    Sciatica    right leg   Swelling    feet, left foot    Past Surgical History:  Procedure Laterality Date   ABDOMINAL HYSTERECTOMY  07/2005   [place] Bunionectomy Left 09/18/2016   Left Foot   BACK SURGERY     KNEE SURGERY  2006   left; multiple   microdiscetomy  2005   SHOULDER SURGERY  07/22/2009   right   SKIN CANCER EXCISION     eyelid   TOTAL ABDOMINAL HYSTERECTOMY     TOTAL KNEE ARTHROPLASTY  07/27/2011   Procedure: TOTAL KNEE ARTHROPLASTY;  Surgeon: Gearlean Alf;  Location: [facility];  Service: Orthopedics;  Laterality: Left;    There were no vitals filed for this visit.    Subjective Assessment - 05/29/21 1454     Subjective Pt reports 3rd episode of dizziness a week ago Wednesday afternoon. Pt tried to lean forward and felt like the room was spinning around. Dizziness happened a few seconds (usually). Pt reports feeling like throwing up. Pt feels  that it's worse in the afternoon. Pt has noticed it in bed when laying down or turning.    Pertinent History BPPV, labrynthitis    Limitations House hold activities;Lifting    Patient Stated Goals Improve dizziness    Currently in Pain? No/denies                [facility] PT Assessment - 05/29/21 0001       Assessment   Medical Diagnosis R42 (ICD-10-CM) - Dizziness    Referring Provider (PT) Hali Marry, MD    Onset Date/Surgical Date --   3-4 months ago   Hand Dominance Right    Prior Therapy None      Precautions   Precautions Fall      Restrictions   Weight Bearing Restrictions No      Balance Screen   Has the patient fallen in the past 6 months No   Near falls -- has been holding on to things     [place] residence    Living Arrangements Spouse/significant other    Available Help at Discharge Friend(s)    Type of Home House      Prior  Function   Level of Independence Independent    Vocation Full time employment    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      Observation/Other Assessments   Focus on Therapeutic Outcomes (FOTO)  n/a      ROM / Strength   AROM / PROM / Strength AROM      AROM   AROM Assessment Site Cervical    Cervical Flexion WFL    Cervical Extension WFL    Cervical - Right Side Bend WFL    Cervical - Left Side Bend WFL    Cervical - Right Rotation [facility]    Cervical - Left Rotation [facility]      Palpation   Spinal mobility Cervical spine spring testing [facility]    Palpation comment No TTP along cervical spine                    Vestibular Assessment - 05/29/21 0001       Vestibular Assessment   General Observation Mildly antalgic gait with trunk lean noted      Symptom Behavior   Subjective history of current problem Pt has been taking meclizine which takes off the edge. First episode ocurred in August and then has recurred almost monthly. Normally went  away in a couple of days.    Type of Dizziness  Spinning;Comment   "rotates"   Frequency of Dizziness 5 or more    Duration of Dizziness Seconds    Symptom Nature Positional    Aggravating Factors Forward bending;Turning head quickly;Rolling to right;Rolling to left   "Motion sick in the car"   Relieving Factors Head stationary;Rest;Slow movements    Progression of Symptoms No change since onset    History of similar episodes ~30 years ago had a months long stretch -- saw ENT. Felt that L ear balance system was shot but R ear would compensate. Pt states she has been able to get it to clear up in the past with maneuvers and her maneuvers aren't doing it.      Oculomotor Exam   Oculomotor Alignment Normal    Ocular ROM WFL    Spontaneous Absent    Gaze-induced  Absent    Smooth Pursuits Intact    Saccades Slow;Overshoots    Comment Some dizziness with convergence      Oculomotor Exam-Fixation Suppressed    Left Head Impulse WFL    Right Head Impulse Mild corrective saccades      Vestibulo-Ocular Reflex   VOR 1 Head Only (x 1 viewing) "A little dizzy" but able to maintain gaze; not as bad with head nods    VOR 2 Head and Object (x 2 viewing) "on the edge of the world about to rotate" mild nystagmus with R head turn L gaze    VOR to Slow Head Movement Normal    VOR Cancellation Normal   "a little queasy"     Positional Testing   Dix-Hallpike Dix-Hallpike Right;Dix-Hallpike Left    Sidelying Test Sidelying Right;Sidelying Left    Horizontal Canal Testing Horizontal Canal Right;Horizontal Canal Left      Dix-Hallpike Right   Dix-Hallpike Right Duration 0      Dix-Hallpike Left   Dix-Hallpike Left Duration 0      Sidelying Right   Sidelying Right Symptoms No nystagmus   "slightly nauseating"     Sidelying Left   Sidelying Left Symptoms No nystagmus   "I felt queasy"  Horizontal Canal Right   Horizontal Canal Right Duration 0      Horizontal Canal Left   Horizontal Canal  Left Duration 0                Objective measurements completed on examination: See above findings.                PT Education - 05/29/21 1713     Education Details Discussed how to check for BPPV and self treat with Epley maneuver at home. Discussed exam findings, POC, and HEP    Person(s) Educated Patient    Methods Explanation;Demonstration;Tactile cues;Verbal cues;Handout    Comprehension Verbalized understanding;Returned demonstration;Verbal cues required;Tactile cues required                 PT Long Term Goals - 05/29/21 1728       PT LONG TERM GOAL #1   Title Pt will be independent with HEP    Time 6    Period Weeks    Status New    Target Date 07/10/21      PT LONG TERM GOAL #2   Title Pt will report improved dizziness by at least 75%    Time 6    Period Weeks    Status New    Target Date 07/10/21      PT LONG TERM GOAL #3   Title Pt will be able to tolerate at least 60 revolutions/min of head movement with gaze stabilization tasks in standing without dizziness    Baseline Unable to tolerate without blurred vision in sitting    Time 6    Period Weeks    Status New    Target Date 07/10/21      PT LONG TERM GOAL #4   Title Pt will be able to maintain standing balance on foam with eyes closed at least 30 sec to demo improved vestibular integration    Time 6    Period Weeks    Status New    Target Date 07/10/21                    Plan - 05/29/21 1715     Clinical Impression Statement Kathy Clark is a 60 y/o F presenting to OPPT due to c/o dizziness. Pt notes that dizziness has been reoccuring in the last 3 or so months -- most recent episode last week. Has had PMH significant for dizziness in the past which she has been able to resolve herself. On assessment, all canalith testing were (-) for BPPV but pt does demonstrate decreased gaze stabilization with continued motion sensitivites with head turns and oculomotor  movements. Discussed with pt that she very well could have had BPPV that she cleared at home but now her vestibular system is having a hard time habituating and adapting. Initiated vestibular exercises this session. Pt would benefit from PT to assist with reintegrating vestibular system to reduce dizziness with work and daily tasks.    Personal Factors and Comorbidities Age;Time since onset of injury/illness/exacerbation;Past/Current Experience    Examination-Activity Limitations Bed Mobility;Bend;Caring for Others    Examination-Participation Restrictions Occupation;Driving    Stability/Clinical Decision Making Evolving/Moderate complexity    Clinical Decision Making Moderate    Rehab Potential Good    PT Frequency 1x / week    PT Duration 6 weeks    PT Treatment/Interventions ADLs/Self Care Home Management;Moist Heat;Traction;Electrical Stimulation;Canalith Repostioning;Gait training;Stair training;Functional mobility training;Therapeutic activities;Therapeutic exercise;Balance training;Neuromuscular re-education;Manual techniques;Patient/family education;Taping;Vestibular;Dry needling;Passive range of  motion    PT Next Visit Plan Assess response to HEP. Test balance and add goal accordingly. Check dynamic visual acuity. Work on habituation and adaptation exercises as able.    PT Home Exercise Plan Access Code: [email or web address]    Consulted and Agree with Plan of Care Patient             Patient will benefit from skilled therapeutic intervention in order to improve the following deficits and impairments:  Abnormal gait, Dizziness, Decreased balance, Decreased mobility, Improper body mechanics  Visit Diagnosis: Dizziness and giddiness  Unsteadiness on feet  Other abnormalities of gait and mobility     Problem List Patient Active Problem List   Diagnosis Date Noted   Indication present for endocarditis prophylaxis 05/07/2020   PVC (premature ventricular contraction) 05/07/2020   Major  depressive disorder with single episode, in partial remission ([place]) 05/07/2020   Bursitis of left shoulder 02/08/2020   Encounter for weight management 01/10/2020   Sleeping difficulties 01/10/2020   Rheumatoid arthritis of multiple sites with negative rheumatoid factor ([place]) 12/04/2019   Polyarthralgia 11/02/2019   Myofascial pain 11/02/2019   Arthralgia of both hands 10/03/2019   Metatarsalgia of left foot 01/05/2018   Other hyperlipidemia 03/23/2017   OSA (obstructive sleep apnea) 02/25/2017   Class 2 obesity due to excess calories with body mass index (BMI) of 38.0 to 38.9 in adult 08/17/2016   Elevated liver function tests 08/17/2016   Vitamin D deficiency 08/03/2016   Anxious depression 02/20/2016   Left carpal tunnel syndrome, post right carpal tunnel release 12/24/2014   Trochanteric bursitis of right hip 04/21/2013   Postop Mild Hyponatremia 07/30/2011   OSTEOARTHRITIS, KNEE, LEFT 12/12/2009   Migraine 05/31/2008   SKIN CANCER, HX OF 01/06/2008   COLITIS, HX OF 01/06/2008   ALLERGIC RHINITIS 05/20/2007   HYPERTENSION, BENIGN ESSENTIAL 11/23/2006   SCIATICA, CHRONIC 05/19/2006    [facility] April Gordy Levan, PT, DPT 05/29/2021, 5:32 PM  [facility] [place], [place], [address] Phone: [phone]   Fax:  [phone]  Name: Kathy Clark MRN: [id number] Date of Birth: 1961/05/25

## 2021-05-29 NOTE — Telephone Encounter (Signed)
They are able to fit her in today. She will call to schedule.

## 2021-05-29 NOTE — Telephone Encounter (Signed)
Hi Kathy Clark, can you call and alcohol to PT and see if there is any way they could work her in today, and if not today if there is any way they can get her an earlier in the day tomorrow for treatment it sounds like she is really getting worse.

## 2021-05-30 ENCOUNTER — Ambulatory Visit: Payer: 59 | Admitting: Physical Therapy

## 2021-05-30 ENCOUNTER — Other Ambulatory Visit (HOSPITAL_COMMUNITY): Payer: Self-pay

## 2021-06-02 ENCOUNTER — Other Ambulatory Visit (HOSPITAL_COMMUNITY): Payer: Self-pay

## 2021-06-06 ENCOUNTER — Other Ambulatory Visit (HOSPITAL_COMMUNITY): Payer: Self-pay

## 2021-06-09 ENCOUNTER — Encounter: Payer: 59 | Admitting: Physical Therapy

## 2021-06-10 DIAGNOSIS — Z79899 Other long term (current) drug therapy: Secondary | ICD-10-CM | POA: Diagnosis not present

## 2021-06-11 ENCOUNTER — Other Ambulatory Visit (HOSPITAL_COMMUNITY): Payer: Self-pay

## 2021-06-11 MED ORDER — METHOCARBAMOL 500 MG PO TABS
ORAL_TABLET | ORAL | 2 refills | Status: DC
  Filled 2021-06-11: qty 90, 30d supply, fill #0
  Filled 2021-08-24: qty 90, 30d supply, fill #1

## 2021-06-19 ENCOUNTER — Other Ambulatory Visit (HOSPITAL_COMMUNITY): Payer: Self-pay

## 2021-06-23 ENCOUNTER — Other Ambulatory Visit (HOSPITAL_COMMUNITY): Payer: Self-pay

## 2021-06-23 ENCOUNTER — Encounter: Payer: Self-pay | Admitting: Family Medicine

## 2021-06-23 MED ORDER — OZEMPIC (2 MG/DOSE) 8 MG/3ML ~~LOC~~ SOPN
2.0000 mg | PEN_INJECTOR | SUBCUTANEOUS | 1 refills | Status: DC
  Filled 2021-06-23: qty 3, 28d supply, fill #0
  Filled 2021-07-22: qty 3, 28d supply, fill #1
  Filled 2021-08-19: qty 3, 28d supply, fill #2

## 2021-06-23 NOTE — Telephone Encounter (Signed)
Meds ordered this encounter  Medications   Semaglutide, 2 MG/DOSE, (OZEMPIC, 2 MG/DOSE,) 8 MG/3ML SOPN    Sig: Inject 2 mg into the skin once a week.    Dispense:  9 mL    Refill:  1

## 2021-06-25 DIAGNOSIS — G4733 Obstructive sleep apnea (adult) (pediatric): Secondary | ICD-10-CM | POA: Diagnosis not present

## 2021-07-02 ENCOUNTER — Other Ambulatory Visit (HOSPITAL_COMMUNITY): Payer: Self-pay

## 2021-07-02 MED ORDER — AMOXICILLIN 500 MG PO CAPS
2000.0000 mg | ORAL_CAPSULE | ORAL | 0 refills | Status: DC
  Filled 2021-07-02: qty 8, 2d supply, fill #0

## 2021-07-12 ENCOUNTER — Telehealth: Payer: 59 | Admitting: Nurse Practitioner

## 2021-07-12 DIAGNOSIS — J329 Chronic sinusitis, unspecified: Secondary | ICD-10-CM | POA: Diagnosis not present

## 2021-07-12 DIAGNOSIS — B9789 Other viral agents as the cause of diseases classified elsewhere: Secondary | ICD-10-CM

## 2021-07-12 MED ORDER — FLUTICASONE PROPIONATE 50 MCG/ACT NA SUSP: 2.0000 | Freq: Every day | NASAL | 6 refills | Status: DC

## 2021-07-12 NOTE — Progress Notes (Signed)
E-Visit for Sinus Problems ? ?We are sorry that you are not feeling well.  Here is how we plan to help! ? ?Based on what you have shared with me it looks like you have sinusitis.  Sinusitis is inflammation and infection in the sinus cavities of the head.  Based on your presentation I believe you most likely have Acute Viral Sinusitis.This is an infection most likely caused by a virus. There is not specific treatment for viral sinusitis other than to help you with the symptoms until the infection runs its course.  You may use an oral decongestant such as Mucinex D or if you have glaucoma or high blood pressure use plain Mucinex. Saline nasal spray help and can safely be used as often as needed for congestion, I have prescribed: Fluticasone nasal spray two sprays in each nostril once a day ? ?Some authorities believe that zinc sprays or the use of Echinacea may shorten the course of your symptoms. ? ?Sinus infections are not as easily transmitted as other respiratory infection, however we still recommend that you avoid close contact with loved ones, especially the very young and elderly.  Remember to wash your hands thoroughly throughout the day as this is the number one way to prevent the spread of infection! ? ?Home Care: ?Only take medications as instructed by your medical team. ?Do not take these medications with alcohol. ?A steam or ultrasonic humidifier can help congestion.  You can place a towel over your head and breathe in the steam from hot water coming from a faucet. ?Avoid close contacts especially the very young and the elderly. ?Cover your mouth when you cough or sneeze. ?Always remember to wash your hands. ? ?Get Help Right Away If: ?You develop worsening fever or sinus pain. ?You develop a severe head ache or visual changes. ?Your symptoms persist after you have completed your treatment plan. ? ?Make sure you ?Understand these instructions. ?Will watch your condition. ?Will get help right away if you  are not doing well or get worse. ? ? ?Thank you for choosing an e-visit. ? ?Your e-visit answers were reviewed by a board certified advanced clinical practitioner to complete your personal care plan. Depending upon the condition, your plan could have included both over the counter or prescription medications. ? ?Please review your pharmacy choice. Make sure the pharmacy is open so you can pick up prescription now. If there is a problem, you may contact your provider through MyChart messaging and have the prescription routed to another pharmacy.  Your safety is important to us. If you have drug allergies check your prescription carefully.  ? ?For the next 24 hours you can use MyChart to ask questions about today's visit, request a non-urgent call back, or ask for a work or school excuse. ?You will get an email in the next two days asking about your experience. I hope that your e-visit has been valuable and will speed your recovery. ? ?5-10 minutes spent reviewing and documenting in chart. ? ?

## 2021-07-17 ENCOUNTER — Other Ambulatory Visit (HOSPITAL_COMMUNITY): Payer: Self-pay

## 2021-07-18 ENCOUNTER — Telehealth: Payer: Self-pay | Admitting: Pharmacist

## 2021-07-18 ENCOUNTER — Other Ambulatory Visit (HOSPITAL_COMMUNITY): Payer: Self-pay

## 2021-07-18 MED ORDER — ENBREL SURECLICK 50 MG/ML ~~LOC~~ SOAJ: SUBCUTANEOUS | 3 refills | Status: DC

## 2021-07-18 NOTE — Telephone Encounter (Signed)
Called patient to schedule an appointment for the [employer] [facility]. I was unable to reach the patient so I left a HIPAA-compliant message requesting that the patient return my call.   Luke Van Ausdall, PharmD, BCACP, CPP Clinical Pharmacist [facility] [phone]  

## 2021-07-19 ENCOUNTER — Other Ambulatory Visit (HOSPITAL_COMMUNITY): Payer: Self-pay

## 2021-07-21 ENCOUNTER — Other Ambulatory Visit (HOSPITAL_COMMUNITY): Payer: Self-pay

## 2021-07-21 MED ORDER — PREDNISONE 5 MG PO TABS
ORAL_TABLET | ORAL | 0 refills | Status: DC
  Filled 2021-07-21: qty 40, 16d supply, fill #0

## 2021-07-22 ENCOUNTER — Other Ambulatory Visit (HOSPITAL_COMMUNITY): Payer: Self-pay

## 2021-07-23 ENCOUNTER — Other Ambulatory Visit: Payer: Self-pay | Admitting: Pharmacist

## 2021-07-23 ENCOUNTER — Other Ambulatory Visit (HOSPITAL_COMMUNITY): Payer: Self-pay

## 2021-07-23 ENCOUNTER — Ambulatory Visit: Payer: 59 | Attending: Family Medicine | Admitting: Pharmacist

## 2021-07-23 DIAGNOSIS — Z79899 Other long term (current) drug therapy: Secondary | ICD-10-CM

## 2021-07-23 DIAGNOSIS — Z1231 Encounter for screening mammogram for malignant neoplasm of breast: Secondary | ICD-10-CM | POA: Diagnosis not present

## 2021-07-23 DIAGNOSIS — Z780 Asymptomatic menopausal state: Secondary | ICD-10-CM | POA: Diagnosis not present

## 2021-07-23 DIAGNOSIS — Z01419 Encounter for gynecological examination (general) (routine) without abnormal findings: Secondary | ICD-10-CM | POA: Diagnosis not present

## 2021-07-23 LAB — HM DEXA SCAN

## 2021-07-23 LAB — HM MAMMOGRAPHY

## 2021-07-23 MED ORDER — ENBREL SURECLICK 50 MG/ML ~~LOC~~ SOAJ
SUBCUTANEOUS | 3 refills | Status: DC
  Filled 2021-07-23: qty 4, 28d supply, fill #0

## 2021-07-23 NOTE — Progress Notes (Signed)
° °  S: Patient presents for review of their specialty medication therapy.  Patient is currently prescribed Enbrel for RA. Patient is managed by Dr. Dorann Ou for this.   Adherence: reported. Due for a dose tomorrow.   Efficacy: reports that she is currently taking prednisone for a flare but up to this point the Enbrel has worked very well for her.   Dosing: Ankylosing spondylitis, psoriatic arthritis, rheumatoid arthritis: SubQ: Note: May continue methotrexate, glucocorticoids, salicylates, NSAIDs, or analgesics during etanercept therapy. Once-weekly dosing: 50 mg once weekly; maximum dose (rheumatoid arthritis): 50 mg/week.  Screening: TB test: completed per pt Hepatitis B: completed per pt  Monitoring: Injection site reactions: none S/sx of infections: none S/sx of malignancy: none GI upset: none    O:     Lab Results  Component Value Date   WBC 6.4 05/23/2021   HGB 15.1 05/23/2021   HCT 44.1 05/23/2021   MCV 90.6 05/23/2021   PLT 314 05/23/2021      Chemistry      Component Value Date/Time   NA 139 05/23/2021 0000   NA 139 11/08/2019 0806   K 3.8 05/23/2021 0000   CL 103 05/23/2021 0000   CO2 26 05/23/2021 0000   BUN 11 05/23/2021 0000   BUN 18 11/08/2019 0806   CREATININE 0.67 05/23/2021 0000      Component Value Date/Time   CALCIUM 9.6 05/23/2021 0000   ALKPHOS 66 11/08/2019 0806   AST 40 (H) 05/23/2021 0000   ALT 81 (H) 05/23/2021 0000   BILITOT 0.6 05/23/2021 0000   BILITOT 0.3 11/08/2019 0806       A/P: 1. Medication review: patient currently taking Enbrel for RA. Reviewed the medication with the patient, including the following: Enbrel (etanercept) binds tumor necrosis factor (TNF) and blocks its interaction with cell surface receptors. TNF plays an important role in the inflammatory processes of many diseases. Patient educated on purpose, proper use and potential adverse effects of Enbrel. SubQ: Administer subcutaneously. Rotate injection sites;  may inject into the thigh (preferred), abdomen (avoiding the 2-inch area around the navel), or outer areas of upper arm. New injections should be given at least 1 inch from an old site and never into areas where the skin is tender, bruised, red, or hard; any raised thick, red, or scaly skin patches or lesions; or areas with scars or stretch marks. For a more comfortable injection, allow autoinjectors, prefilled syringes, and dose trays to reach room temperature for 15 to 30 minutes (?30 minutes for autoinjector) prior to injection; do not remove the needle cover while allowing product to reach room temperature. There may be small white particles of protein in the solution; this is not unusual for proteinaceous solutions. Possible adverse effects include rash, GI upset, increased risk of infection, and injection site reactions. Patients should stop Enbrel if they develop a serious infection. There is a possible increased risk in lymphoma and other malignancies. No recommendations for any changes.   Benard Halsted, PharmD, [place], [place] [phone]

## 2021-07-29 ENCOUNTER — Other Ambulatory Visit: Payer: Self-pay | Admitting: Family Medicine

## 2021-07-31 ENCOUNTER — Other Ambulatory Visit (HOSPITAL_COMMUNITY): Payer: Self-pay

## 2021-07-31 MED ORDER — VILAZODONE HCL 20 MG PO TABS
1.0000 | ORAL_TABLET | Freq: Every day | ORAL | 0 refills | Status: DC
  Filled 2021-07-31: qty 90, 90d supply, fill #0

## 2021-08-07 DIAGNOSIS — M0609 Rheumatoid arthritis without rheumatoid factor, multiple sites: Secondary | ICD-10-CM | POA: Diagnosis not present

## 2021-08-07 DIAGNOSIS — K52832 Lymphocytic colitis: Secondary | ICD-10-CM | POA: Diagnosis not present

## 2021-08-10 ENCOUNTER — Encounter: Payer: Self-pay | Admitting: Emergency Medicine

## 2021-08-10 ENCOUNTER — Telehealth: Payer: 59 | Admitting: Emergency Medicine

## 2021-08-10 ENCOUNTER — Other Ambulatory Visit: Payer: Self-pay | Admitting: Family Medicine

## 2021-08-10 DIAGNOSIS — I10 Essential (primary) hypertension: Secondary | ICD-10-CM

## 2021-08-10 DIAGNOSIS — J069 Acute upper respiratory infection, unspecified: Secondary | ICD-10-CM | POA: Diagnosis not present

## 2021-08-10 MED ORDER — BENZONATATE 100 MG PO CAPS: 100.0000 mg | ORAL_CAPSULE | Freq: Two times a day (BID) | ORAL | 0 refills | Status: DC | PRN

## 2021-08-10 MED ORDER — ALBUTEROL SULFATE HFA 108 (90 BASE) MCG/ACT IN AERS: 1.0000 | INHALATION_SPRAY | Freq: Four times a day (QID) | RESPIRATORY_TRACT | 0 refills | Status: DC | PRN

## 2021-08-10 NOTE — Progress Notes (Signed)
I have spent 5 minutes in review of e-visit questionnaire, review and updating patient chart, medical decision making and response to patient.   Zamzam Whinery, PA-C    

## 2021-08-10 NOTE — Progress Notes (Signed)
We are sorry that you are not feeling well.  Here is how we plan to help!  Based on your presentation I believe you most likely have A cough due to a virus.  This is called viral bronchitis and is best treated by rest, plenty of fluids and control of the cough.  You may use Ibuprofen or Tylenol as directed to help your symptoms.     In addition you may use A prescription cough medication called Tessalon Perles 100mg . You may take 1-2 capsules every 8 hours as needed for your cough.  I will also send in an albuterol inhaler that you may use for bronchospasms.    From your responses in the eVisit questionnaire you describe inflammation in the upper respiratory tract which is causing a significant cough.  This is commonly called Bronchitis and has four common causes:   Allergies Viral Infections Acid Reflux Bacterial Infection Allergies, viruses and acid reflux are treated by controlling symptoms or eliminating the cause. An example might be a cough caused by taking certain blood pressure medications. You stop the cough by changing the medication. Another example might be a cough caused by acid reflux. Controlling the reflux helps control the cough.  USE OF BRONCHODILATOR ("RESCUE") INHALERS: There is a risk from using your bronchodilator too frequently.  The risk is that over-reliance on a medication which only relaxes the muscles surrounding the breathing tubes can reduce the effectiveness of medications prescribed to reduce swelling and congestion of the tubes themselves.  Although you feel brief relief from the bronchodilator inhaler, your asthma may actually be worsening with the tubes becoming more swollen and filled with mucus.  This can delay other crucial treatments, such as oral steroid medications. If you need to use a bronchodilator inhaler daily, several times per day, you should discuss this with your provider.  There are probably better treatments that could be used to keep your asthma  under control.     HOME CARE Only take medications as instructed by your medical team. Complete the entire course of an antibiotic. Drink plenty of fluids and get plenty of rest. Avoid close contacts especially the very young and the elderly Cover your mouth if you cough or cough into your sleeve. Always remember to wash your hands A steam or ultrasonic humidifier can help congestion.   GET HELP RIGHT AWAY IF: You develop worsening fever. You become short of breath You cough up blood. Your symptoms persist after you have completed your treatment plan MAKE SURE YOU  Understand these instructions. Will watch your condition. Will get help right away if you are not doing well or get worse.    Thank you for choosing an e-visit.  Your e-visit answers were reviewed by a board certified advanced clinical practitioner to complete your personal care plan. Depending upon the condition, your plan could have included both over the counter or prescription medications.  Please review your pharmacy choice. Make sure the pharmacy is open so you can pick up prescription now. If there is a problem, you may contact your provider through [employer] and have the prescription routed to another pharmacy.  Your safety is important to [place]. If you have drug allergies check your prescription carefully.   For the next 24 hours you can use MyChart to ask questions about today's visit, request a non-urgent call back, or ask for a work or school excuse. You will get an email in the next two days asking about your experience. I hope  that your e-visit has been valuable and will speed your recovery.

## 2021-08-12 ENCOUNTER — Other Ambulatory Visit (HOSPITAL_COMMUNITY): Payer: Self-pay

## 2021-08-12 ENCOUNTER — Encounter: Payer: Self-pay | Admitting: Family Medicine

## 2021-08-13 ENCOUNTER — Telehealth: Payer: 59 | Admitting: Sports Medicine

## 2021-08-13 ENCOUNTER — Other Ambulatory Visit (HOSPITAL_COMMUNITY): Payer: Self-pay

## 2021-08-13 DIAGNOSIS — J209 Acute bronchitis, unspecified: Secondary | ICD-10-CM

## 2021-08-13 MED ORDER — HYDROCOD POLI-CHLORPHE POLI ER 10-8 MG/5ML PO SUER: 5.0000 mL | Freq: Two times a day (BID) | ORAL | 0 refills | Status: DC | PRN

## 2021-08-13 MED ORDER — AZITHROMYCIN 250 MG PO TABS: ORAL_TABLET | ORAL | 0 refills | Status: DC

## 2021-08-13 MED ORDER — LOSARTAN POTASSIUM 25 MG PO TABS
ORAL_TABLET | ORAL | 0 refills | Status: DC
  Filled 2021-08-13: qty 30, 30d supply, fill #0

## 2021-08-13 MED ORDER — HYDROCHLOROTHIAZIDE 25 MG PO TABS
25.0000 mg | ORAL_TABLET | Freq: Every day | ORAL | 0 refills | Status: DC
  Filled 2021-08-13: qty 30, 30d supply, fill #0

## 2021-08-13 NOTE — Progress Notes (Signed)
° °  Virtual Visit via WebEx/MyChart   I connected with  Kathy Clark  on 08/13/21 via WebEx/MyChart/Doximity Video and verified that I am speaking with the correct person using two identifiers.   I discussed the limitations, risks, security and privacy concerns of performing an evaluation and management service by WebEx/MyChart/Doximity Video, including the higher likelihood of inaccurate diagnosis and treatment, and the availability of in person appointments.  We also discussed the likely need of an additional face to face encounter for complete and high quality delivery of care.  I also discussed with the patient that there may be a patient responsible charge related to this service. The patient expressed understanding and wishes to proceed.  Provider location is in medical facility. Patient location is at their home, different from provider location. People involved in care of the patient during this telehealth encounter were myself, my nurse/medical assistant, and my front office/scheduling team member.  Review of Systems: No fevers, chills, night sweats, weight loss, chest pain, or shortness of breath.   Objective Findings:    General: Speaking full sentences, no audible heavy breathing.  Sounds alert and appropriately interactive.  Appears well.  Face symmetric.  Extraocular movements intact.  Pupils equal and round.  No nasal flaring or accessory muscle use visualized.  Independent interpretation of tests performed by another provider:   None.  Brief History, Exam, Impression, and Recommendations:    Acute bronchitis This is a pleasant 61 year old female, for approximately 6 days now she has had a cough, moderately productive, worse at night, malaise. Mild sore throat. We had her do a COVID test during the video chat and this was negative. She does have a history of rheumatoid arthritis so we need to treat her aggressively, she did have an ED visit recently, prescribed Tessalon  Perles, Mucinex DM, nothing of which is helping. She was also given some albuterol which she has not yet used, I have encouraged her to use the albuterol, adding azithromycin considering her history of rheumatoid arthritis, as well as Tussionex to help keep her sleep at night. Return to see me as needed.   I discussed the above assessment and treatment plan with the patient. The patient was provided an opportunity to ask questions and all were answered. The patient agreed with the plan and demonstrated an understanding of the instructions.   The patient was advised to call back or seek an in-person evaluation if the symptoms worsen or if the condition fails to improve as anticipated.   I provided 30 minutes of face to face and non-face-to-face time during this encounter date, time was needed to gather information, review chart, records, communicate/coordinate with staff remotely, as well as complete documentation.   ___________________________________________ Gwen Her. Dianah Field, M.D., ABFM., CAQSM. Primary Care and [place] Instructor of [place] [facility]

## 2021-08-13 NOTE — Assessment & Plan Note (Signed)
This is a pleasant 61 year old female, for approximately 6 days now she has had a cough, moderately productive, worse at night, malaise. Mild sore throat. We had her do a COVID test during the video chat and this was negative. She does have a history of rheumatoid arthritis so we need to treat her aggressively, she did have an ED visit recently, prescribed Tessalon Perles, Mucinex DM, nothing of which is helping. She was also given some albuterol which she has not yet used, I have encouraged her to use the albuterol, adding azithromycin considering her history of rheumatoid arthritis, as well as Tussionex to help keep her sleep at night. Return to see me as needed.

## 2021-08-18 ENCOUNTER — Other Ambulatory Visit: Payer: Self-pay

## 2021-08-18 ENCOUNTER — Ambulatory Visit: Payer: 59 | Attending: Family Medicine | Admitting: Pharmacist

## 2021-08-18 ENCOUNTER — Other Ambulatory Visit (HOSPITAL_COMMUNITY): Payer: Self-pay

## 2021-08-18 DIAGNOSIS — Z79899 Other long term (current) drug therapy: Secondary | ICD-10-CM

## 2021-08-18 MED ORDER — RINVOQ 15 MG PO TB24: ORAL_TABLET | ORAL | 3 refills | Status: DC

## 2021-08-18 MED ORDER — RINVOQ 15 MG PO TB24
ORAL_TABLET | ORAL | 3 refills | Status: DC
  Filled 2021-08-18: qty 30, fill #0
  Filled 2021-08-20: qty 30, 30d supply, fill #0
  Filled 2021-09-15: qty 30, 30d supply, fill #1
  Filled 2021-10-10: qty 30, 30d supply, fill #2
  Filled 2021-11-04: qty 30, 30d supply, fill #3

## 2021-08-18 NOTE — Progress Notes (Signed)
°  S: Patient presents today for review of their specialty medication.   Patient is changing from Enbrel to Rinvoq for rheumatoid arthritis. Patient is managed by PA Raymondo Band for this. She has not yet stated the Enbrel.   Dosing: Adult  Note: May be used as monotherapy or in combination with methotrexate or other nonbiologic disease-modifying antirheumatic drugs (DMARDs); use in combination with biologic DMARDS or potent immunosuppressants (eg, azathioprine, cyclosporine) is not recommended. Do not initiate therapy in patients with an absolute lymphocyte count <500/mm3, ANC <1,000/mm3, or hemoglobin <8 g/dL. Rheumatoid arthritis: Oral: 15 mg once daily.  Adherence: has not yet started   Efficacy: has not yet started   Monitoring: S/sx thromboembolism: none  S/sx malignancy: none  S/sx of infection: none   Current adverse effects: none    O:     Lab Results  Component Value Date   WBC 6.4 05/23/2021   HGB 15.1 05/23/2021   HCT 44.1 05/23/2021   MCV 90.6 05/23/2021   PLT 314 05/23/2021      Chemistry      Component Value Date/Time   NA 139 05/23/2021 0000   NA 139 11/08/2019 0806   K 3.8 05/23/2021 0000   CL 103 05/23/2021 0000   CO2 26 05/23/2021 0000   BUN 11 05/23/2021 0000   BUN 18 11/08/2019 0806   CREATININE 0.67 05/23/2021 0000      Component Value Date/Time   CALCIUM 9.6 05/23/2021 0000   ALKPHOS 66 11/08/2019 0806   AST 40 (H) 05/23/2021 0000   ALT 81 (H) 05/23/2021 0000   BILITOT 0.6 05/23/2021 0000   BILITOT 0.3 11/08/2019 0806       Lab Results  Component Value Date   CHOL 188 01/08/2021   HDL 51 01/08/2021   LDLCALC 106 (H) 01/08/2021   TRIG 192 (H) 01/08/2021   CHOLHDL 3.7 01/08/2021     A/P: 1. Medication review: patient currently prescribed Rinvoq for rheumatoid arthritis. Reviewed the medication with the patient, including the following: Rinvoq is a medication used to treat rheumatoid arthritis. Administer with or without food.  Swallow tablet whole; do not crush, split, or chew. Possible adverse effects include increased risk of infection, GI upset, hematologic toxicity, hepatic effects, lipid abnormalities, increased risk of malignancy, thromboembolism. Avoid live vaccinations. No recommendations for any changes.  Benard Halsted, PharmD, [facility], [place] [phone]

## 2021-08-19 ENCOUNTER — Other Ambulatory Visit (HOSPITAL_COMMUNITY): Payer: Self-pay

## 2021-08-20 ENCOUNTER — Other Ambulatory Visit (HOSPITAL_COMMUNITY): Payer: Self-pay

## 2021-08-21 ENCOUNTER — Other Ambulatory Visit (HOSPITAL_COMMUNITY): Payer: Self-pay

## 2021-08-22 ENCOUNTER — Other Ambulatory Visit: Payer: Self-pay

## 2021-08-22 ENCOUNTER — Encounter: Payer: Self-pay | Admitting: Family Medicine

## 2021-08-22 ENCOUNTER — Other Ambulatory Visit (HOSPITAL_BASED_OUTPATIENT_CLINIC_OR_DEPARTMENT_OTHER): Payer: Self-pay

## 2021-08-22 ENCOUNTER — Ambulatory Visit: Payer: 59 | Admitting: Family Medicine

## 2021-08-22 VITALS — BP 124/84 | HR 81 | Resp 18 | Ht 68.0 in | Wt 216.0 lb

## 2021-08-22 DIAGNOSIS — R058 Other specified cough: Secondary | ICD-10-CM | POA: Diagnosis not present

## 2021-08-22 MED ORDER — HYDROCODONE BIT-HOMATROP MBR 5-1.5 MG/5ML PO SOLN
5.0000 mL | Freq: Three times a day (TID) | ORAL | 0 refills | Status: DC | PRN
  Filled 2021-08-22: qty 60, 2d supply, fill #0

## 2021-08-22 MED ORDER — PANTOPRAZOLE SODIUM 40 MG PO TBEC
40.0000 mg | DELAYED_RELEASE_TABLET | Freq: Every day | ORAL | 0 refills | Status: DC
  Filled 2021-08-22: qty 30, 30d supply, fill #0

## 2021-08-22 MED ORDER — PREDNISONE 20 MG PO TABS
40.0000 mg | ORAL_TABLET | Freq: Every day | ORAL | 0 refills | Status: DC
  Filled 2021-08-22: qty 10, 5d supply, fill #0

## 2021-08-22 NOTE — Progress Notes (Signed)
Acute Office Visit  Subjective:    Patient ID: Kathy Clark, female    DOB: Oct 23, 1960, 61 y.o.   MRN: [id number]  Chief Complaint  Patient presents with   Cough    Fatigue for 3 weeks    HPI Patient is in today for follow-up of cough.  She was seen initially through urgent care and given Tessalon Perles, and Mucinex.  But nothing was helping.  She was then seen 9 days ago by one of our partners and given albuterol and azithromycin as well as some Tussionex to help her sleep at night.  She unfortunately is still having persistent cough that is keeping her awake at night.  It is mostly dry.  Throat is a little irritated but mostly from the cough itself.  She is had some mild sinus pressure but no significant congestion.    Past Medical History:  Diagnosis Date   Anxiety    Back pain    Complication of anesthesia    allergy to Succinylcholine   Depression    Essential hypertension, benign    Gastritis    GERD (gastroesophageal reflux disease)    Joint pain    Lymphocytic colitis    microscopic   Migraines    Osteoarthritis    Sciatica    right leg   Swelling    feet, left foot    Past Surgical History:  Procedure Laterality Date   ABDOMINAL HYSTERECTOMY  07/2005   [place] Bunionectomy Left 09/18/2016   Left Foot   BACK SURGERY     KNEE SURGERY  2006   left; multiple   microdiscetomy  2005   SHOULDER SURGERY  07/22/2009   right   SKIN CANCER EXCISION     eyelid   TOTAL ABDOMINAL HYSTERECTOMY     TOTAL KNEE ARTHROPLASTY  07/27/2011   Procedure: TOTAL KNEE ARTHROPLASTY;  Surgeon: Loanne Drilling;  Location: [facility];  Service: Orthopedics;  Laterality: Left;    Family History  Problem Relation Age of Onset   Breast cancer Other    Tongue cancer Mother    Breast cancer Mother    Anxiety disorder Mother    Depression Mother    Heart disease Father    Hypertension Father    Hyperlipidemia Father    Stroke Paternal Grandmother    Colon cancer Neg Hx      Social History   Socioeconomic History   Marital status: Married    Spouse name: Rusty   Number of children: 2   Years of education: Not on file   Highest education level: Not on file  Occupational History   Occupation: Teacher, adult education: [employer]  Tobacco Use   Smoking status: Never   Smokeless tobacco: Never  Vaping Use   Vaping Use: Never used  Substance and Sexual Activity   Alcohol use: Yes    Comment: occasionally   Drug use: No   Sexual activity: Yes    Birth control/protection: Surgical    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  Other Topics Concern   Not on file  Social History Narrative   Not on file   Social Determinants of Health   Financial Resource Strain: Not on file  Food Insecurity: Not on file  Transportation Needs: Not on file  Physical Activity: Not on file  Stress: Not on file  Social Connections: Not on file  Intimate Partner Violence: Not on file    Outpatient Medications Prior  to Visit  Medication Sig Dispense Refill   albuterol (VENTOLIN HFA) 108 (90 Base) MCG/ACT inhaler Inhale 1-2 puffs into the lungs every 6 (six) hours as needed for wheezing or shortness of breath. 18 g 0   AMBULATORY NON FORMULARY MEDICATION Medication Name: Please decrease CPAP setting to 8 cm of water pressure and fax [place] a download after 5 days.  Is having a lot of difficulty with air leaks is working to try to reduce her pressure to see if she is still being adequately treated but with fewer leaks.FAx to [place] 1 Units 0   amoxicillin (AMOXIL) 500 MG capsule Take 4 capsules (2,000 mg total) by mouth 1 hour prior to dental appointment 8 capsule 0   benzonatate (TESSALON) 100 MG capsule Take 1 capsule (100 mg total) by mouth 2 (two) times daily as needed for cough. 20 capsule 0   Calcium Carbonate-Vitamin D (CALTRATE 600+D PO) Take 1 tablet by mouth daily.     chlorpheniramine-HYDROcodone 10-8 MG/5ML Take 5 mLs by mouth every 12  (twelve) hours as needed for cough (cough, will cause drowsiness.). 120 mL 0   EPINEPHrine 0.3 mg/0.3 mL IJ SOAJ injection Inject 0.3 mLs (0.3 mg total) into the muscle as needed for anaphylaxis. 2 each prn   ergocalciferol (VITAMIN D2) 1.25 MG (50000 UT) capsule Take by mouth.     estradiol (VIVELLE-DOT) 0.075 MG/24HR Apply 1 patch twice a week 24 patch 3   fluticasone (FLONASE) 50 MCG/ACT nasal spray Place 2 sprays into both nostrils daily. 16 g 6   gabapentin (NEURONTIN) 300 MG capsule TAKE 1 CAPSULE BY MOUTH EVERY MORNING AND 2 CAPSULES EVERY NIGHT AT BEDTIME 270 capsule 3   hydrochlorothiazide (HYDRODIURIL) 25 MG tablet Take 1 tablet (25 mg total) by mouth daily. NO REFILLS. NEEDS AN APPT W/PCP. 30 tablet 0   losartan (COZAAR) 25 MG tablet TAKE 1 TABLET BY MOUTH ONCE A DAY. NO REFILLS. NEEDS AN APPT W/PCP. 30 tablet 0   Melatonin 3 MG TABS Take by mouth as directed.     methocarbamol (ROBAXIN) 500 MG tablet Take one tablet (500 mg dose) by mouth 3 (three) times a day as needed. 90 tablet 2   Multiple Vitamins-Minerals (MULTIVITAMIN ADULT PO) Take by mouth daily.     ondansetron (ZOFRAN) 4 MG tablet Take 1 tablet (4 mg total) by mouth every 8 (eight) hours as needed for nausea or vomiting. 15 tablet 0   Semaglutide, 2 MG/DOSE, (OZEMPIC, 2 MG/DOSE,) 8 MG/3ML SOPN Inject 2 mg into the skin once a week. 9 mL 1   Upadacitinib ER (RINVOQ) 15 MG TB24 Take one tablet (15 mg dose) by mouth daily. 30 tablet 3   verapamil (CALAN-SR) 120 MG CR tablet Take 1 tablet (120 mg total) by mouth at bedtime. 90 tablet 1   Vilazodone HCl 20 MG TABS Take 1 tablet (20 mg total) by mouth daily. 90 tablet 0   azithromycin (ZITHROMAX Z-PAK) 250 MG tablet Take 2 tablets (500 mg) on  Day 1,  followed by 1 tablet (250 mg) once daily on Days 2 through 5. 6 tablet 0   predniSONE (DELTASONE) 5 MG tablet Take 4 tablets (20 mg dose) by mouth daily for 4 days, THEN 3 tabs daily for 4 days, THEN 2 tabs daily for 4 days, THEN 1 tab  daily for 4 days. 40 tablet 0   SUMAtriptan (IMITREX) 20 MG/ACT nasal spray PLACE 1 SPRAY INTO THE NOSE EVERY 2 HOURS AS NEEDED FOR  HEADACHE 6 each 0   No facility-administered medications prior to visit.    Allergies  Allergen Reactions   Succinylcholine Anaphylaxis   Dilaudid [Hydromorphone Hcl] Nausea And Vomiting   Nsaids Other (See Comments)    Trigger colits   Plaquenil [Hydroxychloroquine] Other (See Comments)    Chronic tinnitus    Review of Systems     Objective:    Physical Exam Constitutional:      Appearance: She is well-developed.  HENT:     Head: Normocephalic and atraumatic.     Right Ear: External ear normal.     Left Ear: External ear normal.     Nose: Nose normal.  Eyes:     Conjunctiva/sclera: Conjunctivae normal.     Pupils: Pupils are equal, round, and reactive to light.  Neck:     Thyroid: No thyromegaly.  Cardiovascular:     Rate and Rhythm: Normal rate and regular rhythm.     Heart sounds: Normal heart sounds.  Pulmonary:     Effort: Pulmonary effort is normal.     Breath sounds: Normal breath sounds. No wheezing.     Comments: Slight expiratory wheeze in the right anterior chest. Musculoskeletal:     Cervical back: Neck supple.  Lymphadenopathy:     Cervical: No cervical adenopathy.  Skin:    General: Skin is warm and dry.  Neurological:     Mental Status: She is alert and oriented to person, place, and time.    BP 124/84    Pulse 81    Resp 18    Ht $R'5\' 8"'Ae$  (1.727 m)    Wt 216 lb (98 kg)    LMP  (LMP Unknown) Comment: hystrectomy   SpO2 99%    BMI 32.84 kg/m  Wt Readings from Last 3 Encounters:  08/22/21 216 lb (98 kg)  08/13/21 228 lb (103.4 kg)  05/23/21 237 lb (107.5 kg)    Health Maintenance Due  Topic Date Due   Zoster Vaccines- Shingrix (1 of 2) Never done    There are no preventive care reminders to display for this patient.   Lab Results  Component Value Date   TSH 2.330 05/29/2020   Lab Results  Component Value  Date   WBC 6.4 05/23/2021   HGB 15.1 05/23/2021   HCT 44.1 05/23/2021   MCV 90.6 05/23/2021   PLT 314 05/23/2021   Lab Results  Component Value Date   NA 139 05/23/2021   K 3.8 05/23/2021   CO2 26 05/23/2021   GLUCOSE 84 05/23/2021   BUN 11 05/23/2021   CREATININE 0.67 05/23/2021   BILITOT 0.6 05/23/2021   ALKPHOS 66 11/08/2019   AST 40 (H) 05/23/2021   ALT 81 (H) 05/23/2021   PROT 6.6 05/23/2021   ALBUMIN 4.3 11/08/2019   CALCIUM 9.6 05/23/2021   EGFR 99 05/23/2021   Lab Results  Component Value Date   CHOL 188 01/08/2021   Lab Results  Component Value Date   HDL 51 01/08/2021   Lab Results  Component Value Date   LDLCALC 106 (H) 01/08/2021   Lab Results  Component Value Date   TRIG 192 (H) 01/08/2021   Lab Results  Component Value Date   CHOLHDL 3.7 01/08/2021   Lab Results  Component Value Date   HGBA1C 5.2 11/08/2019       Assessment & Plan:   Problem List Items Addressed This Visit   None Visit Diagnoses     Post-viral cough syndrome    -  Primary   Relevant Medications   predniSONE (DELTASONE) 20 MG tablet   pantoprazole (PROTONIX) 40 MG tablet      Most consistent with postviral cough syndrome.  We discussed a round of prednisone.  Suspect she has significant inflammation.  She is also autoimmune compromised.  We will also start her on a PPI for the next week or 2.  There could be a component of reflux from the causes aggravating the symptoms.  Plus the Protonix will help protect the stomach from the prednisone.  We will also switch at the nighttime cough medicine and just see if over the next week she feels like she is getting treatment better  Meds ordered this encounter  Medications   predniSONE (DELTASONE) 20 MG tablet    Sig: Take 2 tablets (40 mg total) by mouth daily with breakfast.    Dispense:  10 tablet    Refill:  0   pantoprazole (PROTONIX) 40 MG tablet    Sig: Take 1 tablet (40 mg total) by mouth at bedtime.    Dispense:  30  tablet    Refill:  0   HYDROcodone bit-homatropine (HYCODAN) 5-1.5 MG/5ML syrup    Sig: Take 5-10 mLs by mouth every 8 (eight) hours as needed for cough.    Dispense:  60 mL    Refill:  0     Beatrice Lecher, MD

## 2021-08-25 ENCOUNTER — Other Ambulatory Visit (HOSPITAL_COMMUNITY): Payer: Self-pay

## 2021-08-29 ENCOUNTER — Other Ambulatory Visit (HOSPITAL_COMMUNITY): Payer: Self-pay

## 2021-08-29 ENCOUNTER — Encounter: Payer: Self-pay | Admitting: Family Medicine

## 2021-08-29 ENCOUNTER — Ambulatory Visit: Payer: 59 | Admitting: Family Medicine

## 2021-08-29 ENCOUNTER — Other Ambulatory Visit: Payer: Self-pay

## 2021-08-29 VITALS — BP 125/78 | HR 62 | Resp 18 | Ht 68.0 in | Wt 230.0 lb

## 2021-08-29 DIAGNOSIS — R748 Abnormal levels of other serum enzymes: Secondary | ICD-10-CM | POA: Diagnosis not present

## 2021-08-29 DIAGNOSIS — G479 Sleep disorder, unspecified: Secondary | ICD-10-CM

## 2021-08-29 DIAGNOSIS — R5383 Other fatigue: Secondary | ICD-10-CM

## 2021-08-29 DIAGNOSIS — I493 Ventricular premature depolarization: Secondary | ICD-10-CM | POA: Diagnosis not present

## 2021-08-29 DIAGNOSIS — I10 Essential (primary) hypertension: Secondary | ICD-10-CM

## 2021-08-29 DIAGNOSIS — Z7689 Persons encountering health services in other specified circumstances: Secondary | ICD-10-CM

## 2021-08-29 MED ORDER — LOSARTAN POTASSIUM 25 MG PO TABS
ORAL_TABLET | ORAL | 3 refills | Status: DC
  Filled 2021-08-29: qty 90, fill #0
  Filled 2021-09-01: qty 90, 90d supply, fill #0
  Filled 2021-12-08: qty 90, 90d supply, fill #1
  Filled 2022-03-03: qty 90, 90d supply, fill #2
  Filled 2022-06-01: qty 90, 90d supply, fill #3

## 2021-08-29 MED ORDER — HYDROCHLOROTHIAZIDE 25 MG PO TABS
25.0000 mg | ORAL_TABLET | Freq: Every day | ORAL | 3 refills | Status: DC
  Filled 2021-08-29 – 2021-09-01 (×2): qty 90, 90d supply, fill #0
  Filled 2021-12-08: qty 90, 90d supply, fill #1
  Filled 2021-12-14 – 2022-03-03 (×2): qty 90, 90d supply, fill #2
  Filled 2022-06-01: qty 90, 90d supply, fill #3

## 2021-08-29 MED ORDER — VERAPAMIL HCL ER 120 MG PO TBCR
120.0000 mg | EXTENDED_RELEASE_TABLET | Freq: Every day | ORAL | 3 refills | Status: DC
  Filled 2021-08-29 – 2021-11-24 (×2): qty 90, 90d supply, fill #0
  Filled 2022-03-03: qty 90, 90d supply, fill #1
  Filled 2022-05-28: qty 90, 90d supply, fill #2
  Filled 2022-08-25: qty 90, 90d supply, fill #3

## 2021-08-29 MED ORDER — ZALEPLON 5 MG PO CAPS
5.0000 mg | ORAL_CAPSULE | Freq: Every evening | ORAL | 0 refills | Status: DC | PRN
  Filled 2021-08-29: qty 30, 30d supply, fill #0

## 2021-08-29 NOTE — Assessment & Plan Note (Addendum)
We will try low-dose Sonata since it is shorter acting.  Had side effects with Benadryl, Flexeril, Ambien, Belsomra, Dayvigo.

## 2021-08-29 NOTE — Assessment & Plan Note (Signed)
Blood pressure looks fabulous today.  We will go ahead and send refills to pharmacy.

## 2021-08-29 NOTE — Assessment & Plan Note (Addendum)
Visit #:5 Starting Weight:252 lbs.  Current weight:230lbs. Previous weight:235lbs Change in weight:Down5 lbs from last OV Goal weight:185 lb Dietary goals:Protein with each meal, portion controlAdjust MyFitness Pal  to 1500 cal /day Exercise goals:stretching,keep up daily short walks, RA is limitng factor. Start walking 20 minutes a few days a week. Medication: Would like to consider going up to the next higher dose which would be Wegovy 2.4 mg.  We could see if the insurance would cover it and if it is in stock.  But she is doing really well on the Ozempic 2 mg so we could also just keep her at this dose for couple more months and hopefully she will not need continued prednisone over the next few months and I think that that will help her with her weight loss journey.   Follow-up and referrals:8 weeks

## 2021-08-29 NOTE — Assessment & Plan Note (Signed)
Discussed potential contributors/causes. 1.  Sleep quality-we discussed continuing to work on sleep quality.  She did try the [facility] but felt a little too sleepy the following day.  So were going to try Sonata which is a little stronger shorter acting and does work differently. 2.  May be her mood medication she is currently on Viibryd.  She was previously on 40 mg, now down to 20 mg.  Wondering if she may even still need it emotionally she was in a different place when she started the medication she just been a little bit nervous to come off of it.  But it could actually be contributing to some apathy that she is experiencing. 3.  Rheumatoid-she is switching medication/treatments currently.  I am hopeful that this will be a good treatment for her and hopefully if we can get her rheumatoid under better control then she may notice that her pain and fatigue improves as well which then can turn can improve sleep quality and focus and energy levels. 4.  Could consider trial of low-dose stimulant to help with energy levels if needed.

## 2021-08-29 NOTE — Progress Notes (Signed)
Established Patient Office Visit  Subjective:  Patient ID: Kathy Clark, female    DOB: 10/12/1960  Age: 61 y.o. MRN: [id number]  CC:  Chief Complaint  Patient presents with   Weight Management     Currently on Ozempic 2 mg weekly.    Fatigue    Not sleeping well. Discuss options/medications     HPI Kathy Clark presents for   Hypertension- Pt denies chest pain, SOB, dizziness, or heart palpitations.  Taking meds as directed w/o problems.  Denies medication side effects.    Weight management-so far doing really well on the Ozempic.  She has been tolerating it well.  In fact some of the GI symptoms that she was having early on she found out were probably from the leflunomide and not from the [place].  When she came off of that medication the GI symptoms got significantly better.  Unfortunately through the transition she has been on prednisone for several weeks but has managed to still lose 5 pounds.  She has not been walking as regularly she currently has her implant fitness pal set to about 1600 cal/day.  Sometimes eats less.  She also wanted to discuss her persistent fatigue.  She still really feels like she struggles though sometimes it can almost feel more like apathy sometimes she just does not care if something gets done.  But she does not feel down or depressed.  And she notices that she is having more difficulty focusing and multitasking like she was before.  She still really struggling with her sleep quality.  It is very disjointed and disrupted overnight but still manages to usually get about 6 hours total.  But she will often wake up because of pain and discomfort she usually takes his Robaxin and Tylenol at bedtime and then when the Tylenol wears off she starts to have more pain.  Is recently just transitioning to Rinvoq for her rheumatoid arthritis so she is hopeful that it will work well for her.  Past Medical History:  Diagnosis Date   Anxiety    Back pain     Complication of anesthesia    allergy to Succinylcholine   Depression    Essential hypertension, benign    Gastritis    GERD (gastroesophageal reflux disease)    Joint pain    Lymphocytic colitis    microscopic   Migraines    Osteoarthritis    Sciatica    right leg   Swelling    feet, left foot    Past Surgical History:  Procedure Laterality Date   ABDOMINAL HYSTERECTOMY  07/2005   [place] Bunionectomy Left 09/18/2016   Left Foot   BACK SURGERY     KNEE SURGERY  2006   left; multiple   microdiscetomy  2005   SHOULDER SURGERY  07/22/2009   right   SKIN CANCER EXCISION     eyelid   TOTAL ABDOMINAL HYSTERECTOMY     TOTAL KNEE ARTHROPLASTY  07/27/2011   Procedure: TOTAL KNEE ARTHROPLASTY;  Surgeon: Gearlean Alf;  Location: [facility];  Service: Orthopedics;  Laterality: Left;    Family History  Problem Relation Age of Onset   Breast cancer Other    Tongue cancer Mother    Breast cancer Mother    Anxiety disorder Mother    Depression Mother    Heart disease Father    Hypertension Father    Hyperlipidemia Father    Stroke Paternal Grandmother    Colon cancer Neg Hx  Social History   Socioeconomic History   Marital status: Married    Spouse name: Rusty   Number of children: 2   Years of education: Not on file   Highest education level: Not on file  Occupational History   Occupation: Programmer, multimedia: [employer]  Tobacco Use   Smoking status: Never   Smokeless tobacco: Never  Vaping Use   Vaping Use: Never used  Substance and Sexual Activity   Alcohol use: Yes    Comment: occasionally   Drug use: No   Sexual activity: Yes    Birth control/protection: Surgical    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  Other Topics Concern   Not on file  Social History Narrative   Not on file   Social Determinants of Health   Financial Resource Strain: Not on file  Food Insecurity: Not on file  Transportation Needs: Not on file   Physical Activity: Not on file  Stress: Not on file  Social Connections: Not on file  Intimate Partner Violence: Not on file    Outpatient Medications Prior to Visit  Medication Sig Dispense Refill   albuterol (VENTOLIN HFA) 108 (90 Base) MCG/ACT inhaler Inhale 1-2 puffs into the lungs every 6 (six) hours as needed for wheezing or shortness of breath. 18 g 0   AMBULATORY NON FORMULARY MEDICATION Medication Name: Please decrease CPAP setting to 8 cm of water pressure and fax [place] a download after 5 days.  Is having a lot of difficulty with air leaks is working to try to reduce her pressure to see if she is still being adequately treated but with fewer leaks.FAx to [place] 1 Units 0   Calcium Carbonate-Vitamin D (CALTRATE 600+D PO) Take 1 tablet by mouth daily.     EPINEPHrine 0.3 mg/0.3 mL IJ SOAJ injection Inject 0.3 mLs (0.3 mg total) into the muscle as needed for anaphylaxis. 2 each prn   ergocalciferol (VITAMIN D2) 1.25 MG (50000 UT) capsule Take by mouth.     estradiol (VIVELLE-DOT) 0.075 MG/24HR Apply 1 patch twice a week 24 patch 3   fluticasone (FLONASE) 50 MCG/ACT nasal spray Place 2 sprays into both nostrils daily. 16 g 6   gabapentin (NEURONTIN) 300 MG capsule TAKE 1 CAPSULE BY MOUTH EVERY MORNING AND 2 CAPSULES EVERY NIGHT AT BEDTIME 270 capsule 3   Melatonin 3 MG TABS Take by mouth as directed.     methocarbamol (ROBAXIN) 500 MG tablet Take one tablet (500 mg dose) by mouth 3 (three) times a day as needed. 90 tablet 2   Multiple Vitamins-Minerals (MULTIVITAMIN ADULT PO) Take by mouth daily.     ondansetron (ZOFRAN) 4 MG tablet Take 1 tablet (4 mg total) by mouth every 8 (eight) hours as needed for nausea or vomiting. 15 tablet 0   pantoprazole (PROTONIX) 40 MG tablet Take 1 tablet (40 mg total) by mouth at bedtime. 30 tablet 0   Semaglutide, 2 MG/DOSE, (OZEMPIC, 2 MG/DOSE,) 8 MG/3ML SOPN Inject 2 mg into the skin once a week. 9 mL 1   Upadacitinib ER (RINVOQ) 15 MG TB24  Take one tablet (15 mg dose) by mouth daily. 30 tablet 3   Vilazodone HCl 20 MG TABS Take 1 tablet (20 mg total) by mouth daily. 90 tablet 0   hydrochlorothiazide (HYDRODIURIL) 25 MG tablet Take 1 tablet (25 mg total) by mouth daily. NO REFILLS. NEEDS AN APPT W/PCP. 30 tablet 0   losartan (COZAAR) 25 MG  tablet TAKE 1 TABLET BY MOUTH ONCE A DAY. NO REFILLS. NEEDS AN APPT W/PCP. 30 tablet 0   verapamil (CALAN-SR) 120 MG CR tablet Take 1 tablet (120 mg total) by mouth at bedtime. 90 tablet 1   predniSONE (DELTASONE) 5 MG tablet Take 4 tablets (20 mg dose) by mouth daily for 4 days, THEN 3 tabs daily for 4 days, THEN 2 tabs daily for 4 days, THEN 1 tab daily for 4 days. 40 tablet 0   SUMAtriptan (IMITREX) 20 MG/ACT nasal spray PLACE 1 SPRAY INTO THE NOSE EVERY 2 HOURS AS NEEDED FOR HEADACHE 6 each 0   amoxicillin (AMOXIL) 500 MG capsule Take 4 capsules (2,000 mg total) by mouth 1 hour prior to dental appointment 8 capsule 0   azithromycin (ZITHROMAX Z-PAK) 250 MG tablet Take 2 tablets (500 mg) on  Day 1,  followed by 1 tablet (250 mg) once daily on Days 2 through 5. 6 tablet 0   benzonatate (TESSALON) 100 MG capsule Take 1 capsule (100 mg total) by mouth 2 (two) times daily as needed for cough. 20 capsule 0   chlorpheniramine-HYDROcodone 10-8 MG/5ML Take 5 mLs by mouth every 12 (twelve) hours as needed for cough (cough, will cause drowsiness.). 120 mL 0   HYDROcodone bit-homatropine (HYCODAN) 5-1.5 MG/5ML syrup Take 5-10 mLs by mouth every 8 (eight) hours as needed for cough. 60 mL 0   predniSONE (DELTASONE) 20 MG tablet Take 2 tablets (40 mg total) by mouth daily with breakfast. 10 tablet 0   No facility-administered medications prior to visit.    Allergies  Allergen Reactions   Succinylcholine Anaphylaxis   Dilaudid [Hydromorphone Hcl] Nausea And Vomiting   Nsaids Other (See Comments)    Trigger colits   Plaquenil [Hydroxychloroquine] Other (See Comments)    Chronic tinnitus    ROS Review  of Systems    Objective:    Physical Exam Constitutional:      Appearance: Normal appearance. She is well-developed.  HENT:     Head: Normocephalic and atraumatic.  Cardiovascular:     Rate and Rhythm: Normal rate and regular rhythm.     Heart sounds: Normal heart sounds.  Pulmonary:     Effort: Pulmonary effort is normal.     Breath sounds: Normal breath sounds.  Skin:    General: Skin is warm and dry.  Neurological:     Mental Status: She is alert and oriented to person, place, and time.  Psychiatric:        Behavior: Behavior normal.    BP 125/78 (BP Location: Left Arm)    Pulse 62    Resp 18    Ht $R'5\' 8"'eO$  (1.727 m)    Wt 230 lb (104.3 kg)    LMP  (LMP Unknown) Comment: hystrectomy   SpO2 98%    BMI 34.97 kg/m  Wt Readings from Last 3 Encounters:  08/29/21 230 lb (104.3 kg)  08/22/21 216 lb (98 kg)  08/13/21 228 lb (103.4 kg)     Health Maintenance Due  Topic Date Due   Zoster Vaccines- Shingrix (1 of 2) Never done    There are no preventive care reminders to display for this patient.  Lab Results  Component Value Date   TSH 2.330 05/29/2020   Lab Results  Component Value Date   WBC 6.4 05/23/2021   HGB 15.1 05/23/2021   HCT 44.1 05/23/2021   MCV 90.6 05/23/2021   PLT 314 05/23/2021   Lab Results  Component Value Date  NA 139 05/23/2021   K 3.8 05/23/2021   CO2 26 05/23/2021   GLUCOSE 84 05/23/2021   BUN 11 05/23/2021   CREATININE 0.67 05/23/2021   BILITOT 0.6 05/23/2021   ALKPHOS 66 11/08/2019   AST 40 (H) 05/23/2021   ALT 81 (H) 05/23/2021   PROT 6.6 05/23/2021   ALBUMIN 4.3 11/08/2019   CALCIUM 9.6 05/23/2021   EGFR 99 05/23/2021   Lab Results  Component Value Date   CHOL 188 01/08/2021   Lab Results  Component Value Date   HDL 51 01/08/2021   Lab Results  Component Value Date   LDLCALC 106 (H) 01/08/2021   Lab Results  Component Value Date   TRIG 192 (H) 01/08/2021   Lab Results  Component Value Date   CHOLHDL 3.7 01/08/2021    Lab Results  Component Value Date   HGBA1C 5.2 11/08/2019      Assessment & Plan:   Problem List Items Addressed This Visit       Cardiovascular and Mediastinum   PVC (premature ventricular contraction)   Relevant Medications   losartan (COZAAR) 25 MG tablet   hydrochlorothiazide (HYDRODIURIL) 25 MG tablet   verapamil (CALAN-SR) 120 MG CR tablet   HYPERTENSION, BENIGN ESSENTIAL    Blood pressure looks fabulous today.  We will go ahead and send refills to pharmacy.      Relevant Medications   losartan (COZAAR) 25 MG tablet   hydrochlorothiazide (HYDRODIURIL) 25 MG tablet   verapamil (CALAN-SR) 120 MG CR tablet     Other   Sleeping difficulties    We will try low-dose Sonata since it is shorter acting.  Had side effects with Benadryl, Flexeril, Ambien, Belsomra, Dayvigo.       Other fatigue    Discussed potential contributors/causes. 1.  Sleep quality-we discussed continuing to work on sleep quality.  She did try the [facility] but felt a little too sleepy the following day.  So were going to try Sonata which is a little stronger shorter acting and does work differently. 2.  May be her mood medication she is currently on Viibryd.  She was previously on 40 mg, now down to 20 mg.  Wondering if she may even still need it emotionally she was in a different place when she started the medication she just been a little bit nervous to come off of it.  But it could actually be contributing to some apathy that she is experiencing. 3.  Rheumatoid-she is switching medication/treatments currently.  I am hopeful that this will be a good treatment for her and hopefully if we can get her rheumatoid under better control then she may notice that her pain and fatigue improves as well which then can turn can improve sleep quality and focus and energy levels. 4.  Could consider trial of low-dose stimulant to help with energy levels if needed.       Encounter for weight management    Visit  #:5 Starting Weight: 252 lbs.     Current weight: 230 lbs.  Previous weight: 235 lbs Change in weight: Down 5  lbs from last OV Goal weight: 185 lb  Dietary goals: Protein with each meal, portion control  Adjust MyFitness Pal  to 1500 cal /day Exercise goals: stretching, keep up daily short walks, RA is limitng factor. Start walking 20 minutes a few days a week. Medication: Would like to consider going up to the next higher dose which would be Wegovy 2.4 mg.  We could see  if the insurance would cover it and if it is in stock.  But she is doing really well on the Ozempic 2 mg so we could also just keep her at this dose for couple more months and hopefully she will not need continued prednisone over the next few months and I think that that will help her with her weight loss journey.   Follow-up and referrals:8 weeks      Other Visit Diagnoses     Elevated liver enzymes    -  Primary      Liver enzymes  - were rechecked with rheumatology and came back down to normal.  It was a medication side effect.    Meds ordered this encounter  Medications   losartan (COZAAR) 25 MG tablet    Sig: TAKE 1 TABLET BY MOUTH ONCE A DAY.    Dispense:  90 tablet    Refill:  3   hydrochlorothiazide (HYDRODIURIL) 25 MG tablet    Sig: Take 1 tablet by mouth daily.    Dispense:  90 tablet    Refill:  3   verapamil (CALAN-SR) 120 MG CR tablet    Sig: Take 1 tablet  by mouth at bedtime.    Dispense:  90 tablet    Refill:  3    Please mail to patient   zaleplon (SONATA) 5 MG capsule    Sig: Take 1 capsule by mouth at bedtime as needed for sleep.    Dispense:  30 capsule    Refill:  0    Follow-up: Return in about 2 months (around 10/27/2021) for weight management and fatigue.    Beatrice Lecher, MD

## 2021-09-01 ENCOUNTER — Other Ambulatory Visit (HOSPITAL_COMMUNITY): Payer: Self-pay

## 2021-09-03 ENCOUNTER — Telehealth: Payer: Self-pay | Admitting: Family Medicine

## 2021-09-03 NOTE — Telephone Encounter (Signed)
Patient called back and gave her the results of her bone density. She gave verbal understanding to results and recommendations.

## 2021-09-03 NOTE — Telephone Encounter (Signed)
Called patient to give bone density results. Unable to reach patient. Left voicemail for patient to callback.

## 2021-09-03 NOTE — Telephone Encounter (Signed)
Please call patient and let her know that we did receive her still results from [facility] mammography for her bone density test.  Bone density is within normal limits.  Recommend repeat at age 60.

## 2021-09-08 ENCOUNTER — Other Ambulatory Visit (HOSPITAL_COMMUNITY): Payer: Self-pay

## 2021-09-10 ENCOUNTER — Other Ambulatory Visit (HOSPITAL_COMMUNITY): Payer: Self-pay

## 2021-09-10 ENCOUNTER — Encounter: Payer: Self-pay | Admitting: Family Medicine

## 2021-09-10 DIAGNOSIS — Z7689 Persons encountering health services in other specified circumstances: Secondary | ICD-10-CM

## 2021-09-10 MED ORDER — WEGOVY 2.4 MG/0.75ML ~~LOC~~ SOAJ
2.4000 mg | SUBCUTANEOUS | 3 refills | Status: DC
  Filled 2021-09-10: qty 3, 28d supply, fill #0
  Filled 2021-10-17: qty 3, 28d supply, fill #1
  Filled 2021-11-20: qty 3, 28d supply, fill #2
  Filled 2021-12-14: qty 3, 28d supply, fill #3

## 2021-09-11 ENCOUNTER — Other Ambulatory Visit (HOSPITAL_COMMUNITY): Payer: Self-pay

## 2021-09-15 ENCOUNTER — Other Ambulatory Visit (HOSPITAL_COMMUNITY): Payer: Self-pay

## 2021-09-16 ENCOUNTER — Telehealth: Payer: Self-pay

## 2021-09-16 ENCOUNTER — Other Ambulatory Visit (HOSPITAL_COMMUNITY): Payer: Self-pay

## 2021-09-16 NOTE — Telephone Encounter (Signed)
Initiated Prior authorization HJS:CBIPJR 2.'4MG'$ /0.75ML auto-injectors ?Via: Covermymeds ?Case/Key: [id number] ?Status: approved  as of 09/16/21 ?Reason: The request has been approved. The authorization is effective for a maximum of 13 fills from 09/16/2021 to 09/16/2022, a ?Notified Pt via: Mychart ?

## 2021-09-19 ENCOUNTER — Other Ambulatory Visit (HOSPITAL_COMMUNITY): Payer: Self-pay

## 2021-09-29 ENCOUNTER — Other Ambulatory Visit: Payer: Self-pay | Admitting: Family Medicine

## 2021-09-29 ENCOUNTER — Other Ambulatory Visit (HOSPITAL_COMMUNITY): Payer: Self-pay

## 2021-09-29 MED ORDER — SUMATRIPTAN 20 MG/ACT NA SOLN
NASAL | 0 refills | Status: DC
  Filled 2021-09-29: qty 6, 30d supply, fill #0

## 2021-09-29 MED ORDER — ZALEPLON 5 MG PO CAPS
5.0000 mg | ORAL_CAPSULE | Freq: Every evening | ORAL | 1 refills | Status: DC | PRN
  Filled 2021-09-29: qty 90, 90d supply, fill #0

## 2021-09-30 ENCOUNTER — Other Ambulatory Visit (HOSPITAL_COMMUNITY): Payer: Self-pay

## 2021-10-02 DIAGNOSIS — H538 Other visual disturbances: Secondary | ICD-10-CM | POA: Diagnosis not present

## 2021-10-02 DIAGNOSIS — H43811 Vitreous degeneration, right eye: Secondary | ICD-10-CM | POA: Diagnosis not present

## 2021-10-06 ENCOUNTER — Encounter: Payer: Self-pay | Admitting: Family Medicine

## 2021-10-07 ENCOUNTER — Other Ambulatory Visit (HOSPITAL_COMMUNITY): Payer: Self-pay

## 2021-10-07 MED ORDER — FLUTICASONE PROPIONATE 50 MCG/ACT NA SUSP
1.0000 | Freq: Every day | NASAL | 3 refills | Status: DC
  Filled 2021-10-07: qty 48, 90d supply, fill #0
  Filled 2022-02-22: qty 48, 90d supply, fill #1
  Filled 2022-05-28: qty 48, 90d supply, fill #2

## 2021-10-07 NOTE — Telephone Encounter (Signed)
Meds ordered this encounter  ?Medications  ? fluticasone (FLONASE) 50 MCG/ACT nasal spray  ?  Sig: Place 1-2 sprays into both nostrils daily.  ?  Dispense:  48 g  ?  Refill:  3  ? ? ?

## 2021-10-09 ENCOUNTER — Other Ambulatory Visit (HOSPITAL_COMMUNITY): Payer: Self-pay

## 2021-10-09 DIAGNOSIS — Z79899 Other long term (current) drug therapy: Secondary | ICD-10-CM | POA: Diagnosis not present

## 2021-10-09 DIAGNOSIS — G4700 Insomnia, unspecified: Secondary | ICD-10-CM | POA: Diagnosis not present

## 2021-10-09 DIAGNOSIS — M0609 Rheumatoid arthritis without rheumatoid factor, multiple sites: Secondary | ICD-10-CM | POA: Diagnosis not present

## 2021-10-10 ENCOUNTER — Other Ambulatory Visit (HOSPITAL_COMMUNITY): Payer: Self-pay

## 2021-10-10 ENCOUNTER — Encounter: Payer: Self-pay | Admitting: Family Medicine

## 2021-10-11 ENCOUNTER — Other Ambulatory Visit (HOSPITAL_COMMUNITY): Payer: Self-pay

## 2021-10-13 DIAGNOSIS — G4733 Obstructive sleep apnea (adult) (pediatric): Secondary | ICD-10-CM | POA: Diagnosis not present

## 2021-10-13 DIAGNOSIS — H43811 Vitreous degeneration, right eye: Secondary | ICD-10-CM | POA: Diagnosis not present

## 2021-10-15 ENCOUNTER — Other Ambulatory Visit (HOSPITAL_COMMUNITY): Payer: Self-pay

## 2021-10-17 ENCOUNTER — Other Ambulatory Visit (HOSPITAL_COMMUNITY): Payer: Self-pay

## 2021-10-22 DIAGNOSIS — H43813 Vitreous degeneration, bilateral: Secondary | ICD-10-CM | POA: Diagnosis not present

## 2021-10-27 ENCOUNTER — Other Ambulatory Visit (HOSPITAL_COMMUNITY): Payer: Self-pay

## 2021-10-27 ENCOUNTER — Encounter: Payer: Self-pay | Admitting: Family Medicine

## 2021-10-27 ENCOUNTER — Ambulatory Visit: Payer: 59 | Admitting: Family Medicine

## 2021-10-27 VITALS — BP 135/90 | HR 87 | Resp 18 | Ht 68.0 in | Wt 229.0 lb

## 2021-10-27 DIAGNOSIS — Z7689 Persons encountering health services in other specified circumstances: Secondary | ICD-10-CM | POA: Diagnosis not present

## 2021-10-27 DIAGNOSIS — F418 Other specified anxiety disorders: Secondary | ICD-10-CM

## 2021-10-27 DIAGNOSIS — M0609 Rheumatoid arthritis without rheumatoid factor, multiple sites: Secondary | ICD-10-CM

## 2021-10-27 DIAGNOSIS — T50905A Adverse effect of unspecified drugs, medicaments and biological substances, initial encounter: Secondary | ICD-10-CM | POA: Diagnosis not present

## 2021-10-27 DIAGNOSIS — G479 Sleep disorder, unspecified: Secondary | ICD-10-CM

## 2021-10-27 MED ORDER — EPINEPHRINE 0.3 MG/0.3ML IJ SOAJ
0.3000 mg | INTRAMUSCULAR | 99 refills | Status: DC | PRN
  Filled 2021-10-27: qty 2, 30d supply, fill #0
  Filled 2022-01-12: qty 2, 30d supply, fill #1
  Filled 2022-10-15: qty 2, 30d supply, fill #2

## 2021-10-27 NOTE — Assessment & Plan Note (Signed)
Currently on 20 mg of Viibryd.  She has been under some increased dressers recently she has been financially helping to support her son and daughter-in-law hopefully just temporarily as they are going through some struggles. Daughter-in-law lost her job about 5 months ago and has put some financial strain on their household. ?

## 2021-10-27 NOTE — Progress Notes (Signed)
? ?Established Patient Office Visit ? ?Subjective:  ?Patient ID: Kathy Clark, female    DOB: July 23, 1960  Age: 61 y.o. MRN: [id number] ? ?CC:  ?Chief Complaint  ?Patient presents with  ? Weight Management   ?  Patient currently on Wegovy 2.'4mg'$ . Patient's Previous weight was 230. Patient's Current weight today is 229lb.  ? ? ?HPI ?Kathy Clark presents for  ? ?Follow-up weight management-currently on Wegovy 2.4 mg.  Weight is currently 229 today.  She has lost another pound but overall feels like she has been stable.  She has been eating on the go a little bit more and knows that something she really needs to work on.  She still using my fitness pal.  She is getting some exercise in but is planning on using a new app that she just downloaded call bloom to help her create an exercise routine at home. ? 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 ? ?He still had follow-up with rheumatology March 30 currently on Rinvoq and doing well.  Been happy with the new medication thus far. ? ?Follow-up insomnia-she did try the Sonata but says it actually made her feel a little loopy so she stopped it.  She does have an appointment coming up with a sleep specialist this summer. ? ?Past Medical History:  ?Diagnosis Date  ? Anxiety   ? Back pain   ? Complication of anesthesia   ? allergy to Succinylcholine  ? Depression   ? Essential hypertension, benign   ? Gastritis   ? GERD (gastroesophageal reflux disease)   ? Joint pain   ? Lymphocytic colitis   ? microscopic  ? Migraines   ? Osteoarthritis   ? Sciatica   ? right leg  ? Swelling   ? feet, left foot  ? ? ?Past Surgical History:  ?Procedure Laterality Date  ? ABDOMINAL HYSTERECTOMY  07/2005  ? [place] Bunionectomy Left 09/18/2016  ? Left Foot  ? BACK SURGERY    ? KNEE SURGERY  2006  ? left; multiple  ? microdiscetomy  2005  ? SHOULDER SURGERY  07/22/2009  ? right  ? SKIN CANCER EXCISION    ? eyelid  ? TOTAL ABDOMINAL  HYSTERECTOMY    ? TOTAL KNEE ARTHROPLASTY  07/27/2011  ? Procedure: TOTAL KNEE ARTHROPLASTY;  Surgeon: Gearlean Alf;  Location: [facility];  Service: Orthopedics;  Laterality: Left;  ? ? ?Family History  ?Problem Relation Age of Onset  ? Breast cancer Other   ? Tongue cancer Mother   ? Breast cancer Mother   ? Anxiety disorder Mother   ? Depression Mother   ? Heart disease Father   ? Hypertension Father   ? Hyperlipidemia Father   ? Stroke Paternal Grandmother   ? Colon cancer Neg Hx   ? ? ?Social History  ? ?Socioeconomic History  ? Marital status: Married  ?  Spouse name: Stormy Card  ? Number of children: 2  ? Years of education: Not on file  ? Highest education level: Not on file  ?Occupational History  ? Occupation: Therapist, sports  ?  Employer: [employer]  ?Tobacco Use  ? Smoking status: Never  ? Smokeless tobacco: Never  ?Vaping Use  ? Vaping Use: Never used  ?Substance and Sexual Activity  ? Alcohol use: Yes  ?  Comment: occasionally  ? Drug use: No  ? Sexual activity: Yes  ?  Birth control/protection: Surgical  ?  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  ?Other Topics Concern  ? Not on file  ?Social History Narrative  ? Not on file  ? ?Social Determinants of Health  ? ?Financial Resource Strain: Not on file  ?Food Insecurity: Not on file  ?Transportation Needs: Not on file  ?Physical Activity: Not on file  ?Stress: Not on file  ?Social Connections: Not on file  ?Intimate Partner Violence: Not on file  ? ? ?Outpatient Medications Prior to Visit  ?Medication Sig Dispense Refill  ? AMBULATORY NON FORMULARY MEDICATION Medication Name: Please decrease CPAP setting to 8 cm of water pressure and fax [place] a download after 5 days.  Is having a lot of difficulty with air leaks is working to try to reduce her pressure to see if she is still being adequately treated but with fewer leaks.FAx to [place] 1 Units 0  ? Calcium Carbonate-Vitamin D (CALTRATE 600+D PO) Take 1 tablet by mouth daily.    ?  ergocalciferol (VITAMIN D2) 1.25 MG (50000 UT) capsule Take by mouth.    ? estradiol (VIVELLE-DOT) 0.075 MG/24HR Apply 1 patch twice a week 24 patch 3  ? fluticasone (FLONASE) 50 MCG/ACT nasal spray Place 1 to 2 sprays into both nostrils daily. 48 g 3  ? gabapentin (NEURONTIN) 300 MG capsule TAKE 1 CAPSULE BY MOUTH EVERY MORNING AND 2 CAPSULES EVERY NIGHT AT BEDTIME 270 capsule 3  ? hydrochlorothiazide (HYDRODIURIL) 25 MG tablet Take 1 tablet by mouth daily. 90 tablet 3  ? losartan (COZAAR) 25 MG tablet TAKE 1 TABLET BY MOUTH ONCE A DAY. 90 tablet 3  ? Melatonin 3 MG TABS Take by mouth as directed.    ? methocarbamol (ROBAXIN) 500 MG tablet Take one tablet (500 mg dose) by mouth 3 (three) times a day as needed. 90 tablet 2  ? Multiple Vitamins-Minerals (MULTIVITAMIN ADULT PO) Take by mouth daily.    ? ondansetron (ZOFRAN) 4 MG tablet Take 1 tablet (4 mg total) by mouth every 8 (eight) hours as needed for nausea or vomiting. 15 tablet 0  ? predniSONE (DELTASONE) 5 MG tablet Take 4 tablets (20 mg dose) by mouth daily for 4 days, THEN 3 tabs daily for 4 days, THEN 2 tabs daily for 4 days, THEN 1 tab daily for 4 days. 40 tablet 0  ? Semaglutide-Weight Management (WEGOVY) 2.4 MG/0.75ML SOAJ Inject 2.4 mg into the skin every 7 (seven) days. 3 mL 3  ? SUMAtriptan (IMITREX) 20 MG/ACT nasal spray PLACE 1 SPRAY INTO THE NOSE EVERY 2 HOURS AS NEEDED FOR HEADACHE 6 each 0  ? Upadacitinib ER (RINVOQ) 15 MG TB24 Take one tablet (15 mg dose) by mouth daily. 30 tablet 3  ? verapamil (CALAN-SR) 120 MG CR tablet Take 1 tablet  by mouth at bedtime. 90 tablet 3  ? Vilazodone HCl 20 MG TABS Take 1 tablet (20 mg total) by mouth daily. 90 tablet 0  ? EPINEPHrine 0.3 mg/0.3 mL IJ SOAJ injection Inject 0.3 mLs (0.3 mg total) into the muscle as needed for anaphylaxis. 2 each prn  ? albuterol (VENTOLIN HFA) 108 (90 Base) MCG/ACT inhaler Inhale 1-2 puffs into the lungs every 6 (six) hours as needed for wheezing or shortness of breath. 18 g 0   ? pantoprazole (PROTONIX) 40 MG tablet Take 1 tablet (40 mg total) by mouth at bedtime. 30 tablet 0  ? Semaglutide, 2 MG/DOSE, (OZEMPIC, 2 MG/DOSE,) 8 MG/3ML SOPN Inject 2 mg into the skin once a  week. 9 mL 1  ? zaleplon (SONATA) 5 MG capsule Take 1 capsule by mouth at bedtime as needed for sleep. 90 capsule 1  ? ?No facility-administered medications prior to visit.  ? ? ?Allergies  ?Allergen Reactions  ? Succinylcholine Anaphylaxis  ? Dilaudid [Hydromorphone Hcl] Nausea And Vomiting  ? Nsaids Other (See Comments)  ?  Trigger colits  ? Plaquenil [Hydroxychloroquine] Other (See Comments)  ?  Chronic tinnitus  ? ? ?ROS ?Review of Systems ? ?  ?Objective:  ?  ?Physical Exam ?Constitutional:   ?   Appearance: Normal appearance. She is well-developed.  ?HENT:  ?   Head: Normocephalic and atraumatic.  ?Cardiovascular:  ?   Rate and Rhythm: Normal rate and regular rhythm.  ?   Heart sounds: Normal heart sounds.  ?Pulmonary:  ?   Effort: Pulmonary effort is normal.  ?   Breath sounds: Normal breath sounds.  ?Skin: ?   General: Skin is warm and dry.  ?Neurological:  ?   Mental Status: She is alert and oriented to person, place, and time.  ?Psychiatric:     ?   Behavior: Behavior normal.  ? ? ?BP 135/90   Pulse 87   Resp 18   Ht '5\' 8"'$  (1.727 m)   Wt 229 lb (103.9 kg)   LMP  (LMP Unknown) Comment: hystrectomy  SpO2 98%   BMI 34.82 kg/m?  ?Wt Readings from Last 3 Encounters:  ?10/27/21 229 lb (103.9 kg)  ?08/29/21 230 lb (104.3 kg)  ?08/22/21 216 lb (98 kg)  ? ? ? ?Health Maintenance Due  ?Topic Date Due  ? Zoster Vaccines- Shingrix (1 of 2) Never done  ? ? ?There are no preventive care reminders to display for this patient. ? ?Lab Results  ?Component Value Date  ? TSH 2.330 05/29/2020  ? ?Lab Results  ?Component Value Date  ? WBC 6.4 05/23/2021  ? HGB 15.1 05/23/2021  ? HCT 44.1 05/23/2021  ? MCV 90.6 05/23/2021  ? PLT 314 05/23/2021  ? ?Lab Results  ?Component Value Date  ? NA 139 05/23/2021  ? K 3.8 05/23/2021  ? CO2  26 05/23/2021  ? GLUCOSE 84 05/23/2021  ? BUN 11 05/23/2021  ? CREATININE 0.67 05/23/2021  ? BILITOT 0.6 05/23/2021  ? ALKPHOS 66 11/08/2019  ? AST 40 (H) 05/23/2021  ? ALT 81 (H) 05/23/2021  ? PROT 6.6 05/23/2021  ?

## 2021-10-27 NOTE — Assessment & Plan Note (Signed)
Had side effects with Benadryl, Flexeril, Ambien, Belsomra, Dayvigo, and Sonata.  ?Is an appointment this summer with a sleep specialist.  I think this will be helpful for her to maybe see if they can find something that would be helpful.  She said she would rather feel  tired then have side effects. ? ?

## 2021-10-27 NOTE — Assessment & Plan Note (Signed)
Visit # 6 ?Starting Weight:?252 lbs.?? ?? ?Current weight:?229?lbs.? ?Previous weight:?230?lbs ?Change in weight:?Down?1?lbs?from last OV ?Goal weight:?185 lb? ?Dietary goals:?Protein with each meal, portion control?Continue  MyFitness Pal. ?Exercise goals:?stretching,?keep up daily short walks, RA is limitng factor. Start Bloom App. ?Medication: Continue Wegovy 2.4 mg.  ?Follow-up and referrals:8 weeks ?

## 2021-10-27 NOTE — Assessment & Plan Note (Signed)
Really well thus far on Rinvoq ?

## 2021-10-31 ENCOUNTER — Other Ambulatory Visit (HOSPITAL_COMMUNITY): Payer: Self-pay

## 2021-10-31 ENCOUNTER — Other Ambulatory Visit: Payer: Self-pay | Admitting: Family Medicine

## 2021-10-31 MED ORDER — VILAZODONE HCL 20 MG PO TABS
1.0000 | ORAL_TABLET | Freq: Every day | ORAL | 0 refills | Status: DC
  Filled 2021-10-31: qty 90, 90d supply, fill #0

## 2021-11-04 ENCOUNTER — Other Ambulatory Visit (HOSPITAL_COMMUNITY): Payer: Self-pay

## 2021-11-10 DIAGNOSIS — H43813 Vitreous degeneration, bilateral: Secondary | ICD-10-CM | POA: Diagnosis not present

## 2021-11-13 ENCOUNTER — Other Ambulatory Visit (HOSPITAL_COMMUNITY): Payer: Self-pay

## 2021-11-20 ENCOUNTER — Other Ambulatory Visit (HOSPITAL_COMMUNITY): Payer: Self-pay

## 2021-11-24 ENCOUNTER — Other Ambulatory Visit (HOSPITAL_COMMUNITY): Payer: Self-pay

## 2021-12-05 ENCOUNTER — Other Ambulatory Visit: Payer: Self-pay | Admitting: Pharmacist

## 2021-12-05 ENCOUNTER — Other Ambulatory Visit (HOSPITAL_COMMUNITY): Payer: Self-pay

## 2021-12-05 MED ORDER — RINVOQ 15 MG PO TB24: ORAL_TABLET | ORAL | 2 refills | Status: DC

## 2021-12-05 MED ORDER — RINVOQ 15 MG PO TB24
ORAL_TABLET | ORAL | 2 refills | Status: DC
  Filled 2021-12-05: qty 30, 30d supply, fill #0
  Filled 2022-01-02: qty 30, 30d supply, fill #1
  Filled 2022-01-29: qty 30, 30d supply, fill #2

## 2021-12-09 ENCOUNTER — Other Ambulatory Visit (HOSPITAL_COMMUNITY): Payer: Self-pay

## 2021-12-10 ENCOUNTER — Other Ambulatory Visit (HOSPITAL_COMMUNITY): Payer: Self-pay

## 2021-12-10 DIAGNOSIS — G4730 Sleep apnea, unspecified: Secondary | ICD-10-CM | POA: Diagnosis not present

## 2021-12-10 DIAGNOSIS — G4733 Obstructive sleep apnea (adult) (pediatric): Secondary | ICD-10-CM | POA: Diagnosis not present

## 2021-12-10 DIAGNOSIS — G4710 Hypersomnia, unspecified: Secondary | ICD-10-CM | POA: Diagnosis not present

## 2021-12-10 DIAGNOSIS — Z9989 Dependence on other enabling machines and devices: Secondary | ICD-10-CM | POA: Diagnosis not present

## 2021-12-10 MED ORDER — ARMODAFINIL 150 MG PO TABS
ORAL_TABLET | ORAL | 1 refills | Status: DC
  Filled 2021-12-10: qty 30, 30d supply, fill #0

## 2021-12-11 ENCOUNTER — Other Ambulatory Visit (HOSPITAL_COMMUNITY): Payer: Self-pay

## 2021-12-11 ENCOUNTER — Other Ambulatory Visit: Payer: Self-pay | Admitting: Family Medicine

## 2021-12-11 ENCOUNTER — Encounter: Payer: Self-pay | Admitting: Family Medicine

## 2021-12-12 ENCOUNTER — Other Ambulatory Visit (HOSPITAL_COMMUNITY): Payer: Self-pay

## 2021-12-15 ENCOUNTER — Other Ambulatory Visit (HOSPITAL_COMMUNITY): Payer: Self-pay

## 2021-12-24 ENCOUNTER — Other Ambulatory Visit (HOSPITAL_COMMUNITY): Payer: Self-pay

## 2021-12-24 MED ORDER — ARMODAFINIL 200 MG PO TABS
1.0000 | ORAL_TABLET | Freq: Every day | ORAL | 2 refills | Status: DC
  Filled 2021-12-24: qty 30, 30d supply, fill #0

## 2021-12-25 ENCOUNTER — Other Ambulatory Visit (HOSPITAL_COMMUNITY): Payer: Self-pay

## 2021-12-29 ENCOUNTER — Ambulatory Visit: Payer: 59 | Admitting: Family Medicine

## 2021-12-31 ENCOUNTER — Other Ambulatory Visit (HOSPITAL_COMMUNITY): Payer: Self-pay

## 2022-01-02 ENCOUNTER — Other Ambulatory Visit (HOSPITAL_COMMUNITY): Payer: Self-pay

## 2022-01-06 ENCOUNTER — Other Ambulatory Visit (HOSPITAL_COMMUNITY): Payer: Self-pay

## 2022-01-07 ENCOUNTER — Other Ambulatory Visit (HOSPITAL_COMMUNITY): Payer: Self-pay

## 2022-01-07 DIAGNOSIS — G4733 Obstructive sleep apnea (adult) (pediatric): Secondary | ICD-10-CM | POA: Diagnosis not present

## 2022-01-09 ENCOUNTER — Ambulatory Visit: Payer: 59 | Admitting: Family Medicine

## 2022-01-09 ENCOUNTER — Other Ambulatory Visit (HOSPITAL_COMMUNITY): Payer: Self-pay

## 2022-01-09 ENCOUNTER — Encounter: Payer: Self-pay | Admitting: Family Medicine

## 2022-01-09 VITALS — BP 120/70 | HR 70 | Ht 68.0 in | Wt 227.0 lb

## 2022-01-09 DIAGNOSIS — Z7689 Persons encountering health services in other specified circumstances: Secondary | ICD-10-CM | POA: Diagnosis not present

## 2022-01-09 DIAGNOSIS — R41840 Attention and concentration deficit: Secondary | ICD-10-CM

## 2022-01-09 DIAGNOSIS — I10 Essential (primary) hypertension: Secondary | ICD-10-CM | POA: Diagnosis not present

## 2022-01-09 MED ORDER — AMPHETAMINE-DEXTROAMPHET ER 10 MG PO CP24
10.0000 mg | ORAL_CAPSULE | Freq: Every day | ORAL | 0 refills | Status: DC
  Filled 2022-01-09: qty 30, 30d supply, fill #0

## 2022-01-09 NOTE — Assessment & Plan Note (Signed)
We discussed options.  For now encouraged her to drop back down on the Nuvigil to 150 just to see if that feels like a better dose.  We also discussed maybe doing a trial of a stimulant.  We had previously discussed this before as well.  After she discontinues the Nuvigil then we can try a low-dose of Adderall.  Stop immediately if any chest pain palpitations or headaches.  She does have a home blood pressure cuff so she can monitor BP at home as well we can always adjust the dose if she is tolerating it well.  Did have her complete the adult ADHD screen.  She did mark 3 of the questions positive in Group A.( 4 questions is considered a strong correlation) see scanned doc.

## 2022-01-09 NOTE — Assessment & Plan Note (Signed)
Well controlled. Continue current regimen. Follow up in  6 mo  

## 2022-01-09 NOTE — Assessment & Plan Note (Signed)
Visit # 7 Starting Weight:252 lbs.  Current weight:227lbs. Previous weight:229lbs Change in weight:Down2lbsfrom last OV Goal weight:185 lb Dietary goals:Protein with each meal, portion controlContinue MyFitness Pal. Exercise goals:starting water aerobics. RA is limitng factor. Start Bloom App. Medication:Continue Wegovy 2.4 mg. Follow-up:12 weeks

## 2022-01-09 NOTE — Progress Notes (Signed)
Established Patient Office Visit  Subjective   Patient ID: Kathy Clark, female    DOB: July 01, 1961  Age: 61 y.o. MRN: [id number]  Chief Complaint  Patient presents with   Weight Check    HPI  Follow-up weight management -she feels like overall she is doing well with the [facility] she is on 2.4 mg.  Per our scale she is about 2 pounds down but on her home scale about 4 pounds.  She feels like it still helpful.  She just started a water aerobics class this summer.  He is hopeful that that will be gentle on her joints but allow her to exercise.  She still continuing to work on diet.  Hypertension- Pt denies chest pain, SOB, dizziness, or heart palpitations.  Taking meds as directed w/o problems.  Denies medication side effects.    She was able to start the new vigil as recommended by the sleep clinic.  The hope was that it would give her a little bit more energy during the day and then allow her to rest a little bit better at night.  She recently increased from 150 mg to 200 mg but says she feels more "scatterbrained" on the 200 mg dose.  She is really wondered over the years if she actually has ADD.  Both of her children were diagnosed with ADD and years ago when they were being diagnosed she noticed that she had a lot of similar symptoms.  She felt like for the most part she was managing them.    Really struggling with sleep quality.     ROS    Objective:     BP 120/70   Pulse 70   Ht '5\' 8"'$  (1.727 m)   Wt 227 lb (103 kg)   LMP  (LMP Unknown) Comment: hystrectomy  SpO2 99%   BMI 34.52 kg/m    Physical Exam Vitals and nursing note reviewed.  Constitutional:      Appearance: She is well-developed.  HENT:     Head: Normocephalic and atraumatic.  Cardiovascular:     Rate and Rhythm: Normal rate and regular rhythm.     Heart sounds: Normal heart sounds.  Pulmonary:     Effort: Pulmonary effort is normal.     Breath sounds: Normal breath sounds.  Skin:    General: Skin  is warm and dry.  Neurological:     Mental Status: She is alert and oriented to person, place, and time.  Psychiatric:        Behavior: Behavior normal.      No results found for any visits on 01/09/22.    The 10-year ASCVD risk score (Arnett DK, et al., 2019) is: 5.2%    Assessment & Plan:   Problem List Items Addressed This Visit       Cardiovascular and Mediastinum   HYPERTENSION, BENIGN ESSENTIAL    Well controlled. Continue current regimen. Follow up in  10mo      Relevant Orders   BASIC METABOLIC PANEL WITH GFR     Other   Inattention    We discussed options.  For now encouraged her to drop back down on the Nuvigil to 150 just to see if that feels like a better dose.  We also discussed maybe doing a trial of a stimulant.  We had previously discussed this before as well.  After she discontinues the Nuvigil then we can try a low-dose of Adderall.  Stop immediately if any chest pain palpitations  or headaches.  She does have a home blood pressure cuff so she can monitor BP at home as well we can always adjust the dose if she is tolerating it well.  Did have her complete the adult ADHD screen.  She did mark 3 of the questions positive in Group A.( 4 questions is considered a strong correlation) see scanned doc.       Encounter for weight management - Primary    Visit # 7 Starting Weight: 252 lbs.     Current weight: 227 lbs.  Previous weight: 229 lbs Change in weight: Down 2 lbs from last OV Goal weight: 185 lb  Dietary goals: Protein with each meal, portion control Continue  MyFitness Pal. Exercise goals: starting water aerobics. RA is limitng factor. Start Bloom App. Medication: Continue Wegovy 2.4 mg.  Follow-up :12 weeks       Return in about 5 weeks (around 02/13/2022) for New start medication.    Beatrice Lecher, MD

## 2022-01-10 LAB — BASIC METABOLIC PANEL WITH GFR
BUN: 13 mg/dL (ref 7–25)
CO2: 33 mmol/L — ABNORMAL HIGH (ref 20–32)
Calcium: 9.5 mg/dL (ref 8.6–10.4)
Chloride: 99 mmol/L (ref 98–110)
Creat: 0.78 mg/dL (ref 0.50–1.05)
Glucose, Bld: 84 mg/dL (ref 65–99)
Potassium: 4.1 mmol/L (ref 3.5–5.3)
Sodium: 139 mmol/L (ref 135–146)
eGFR: 86 mL/min/{1.73_m2} (ref >=60)

## 2022-01-12 ENCOUNTER — Other Ambulatory Visit: Payer: Self-pay | Admitting: Family Medicine

## 2022-01-12 ENCOUNTER — Other Ambulatory Visit (HOSPITAL_COMMUNITY): Payer: Self-pay

## 2022-01-12 DIAGNOSIS — Z7689 Persons encountering health services in other specified circumstances: Secondary | ICD-10-CM

## 2022-01-12 MED ORDER — WEGOVY 2.4 MG/0.75ML ~~LOC~~ SOAJ
2.4000 mg | SUBCUTANEOUS | 3 refills | Status: DC
  Filled 2022-01-12 – 2022-01-19 (×2): qty 3, 28d supply, fill #0
  Filled 2022-02-09: qty 3, 28d supply, fill #1
  Filled 2022-03-10: qty 3, 28d supply, fill #2
  Filled 2022-04-07: qty 3, 28d supply, fill #3

## 2022-01-12 NOTE — Progress Notes (Signed)
Your lab work is within acceptable range and there are no concerning findings.   ?

## 2022-01-14 ENCOUNTER — Other Ambulatory Visit (HOSPITAL_COMMUNITY): Payer: Self-pay

## 2022-01-15 ENCOUNTER — Other Ambulatory Visit (HOSPITAL_COMMUNITY): Payer: Self-pay

## 2022-01-19 ENCOUNTER — Other Ambulatory Visit (HOSPITAL_COMMUNITY): Payer: Self-pay

## 2022-01-19 DIAGNOSIS — H43813 Vitreous degeneration, bilateral: Secondary | ICD-10-CM | POA: Diagnosis not present

## 2022-01-21 ENCOUNTER — Other Ambulatory Visit: Payer: Self-pay | Admitting: Family Medicine

## 2022-01-21 ENCOUNTER — Other Ambulatory Visit (HOSPITAL_COMMUNITY): Payer: Self-pay

## 2022-01-21 MED ORDER — VILAZODONE HCL 20 MG PO TABS
1.0000 | ORAL_TABLET | Freq: Every day | ORAL | 0 refills | Status: DC
  Filled 2022-01-21: qty 90, 90d supply, fill #0

## 2022-01-22 DIAGNOSIS — G4730 Sleep apnea, unspecified: Secondary | ICD-10-CM | POA: Diagnosis not present

## 2022-01-22 DIAGNOSIS — G4733 Obstructive sleep apnea (adult) (pediatric): Secondary | ICD-10-CM | POA: Diagnosis not present

## 2022-01-22 DIAGNOSIS — Z9989 Dependence on other enabling machines and devices: Secondary | ICD-10-CM | POA: Diagnosis not present

## 2022-01-22 DIAGNOSIS — G4710 Hypersomnia, unspecified: Secondary | ICD-10-CM | POA: Diagnosis not present

## 2022-01-26 ENCOUNTER — Encounter: Payer: Self-pay | Admitting: Family Medicine

## 2022-01-27 DIAGNOSIS — H524 Presbyopia: Secondary | ICD-10-CM | POA: Diagnosis not present

## 2022-01-29 ENCOUNTER — Other Ambulatory Visit (HOSPITAL_COMMUNITY): Payer: Self-pay

## 2022-02-05 ENCOUNTER — Telehealth: Payer: 59 | Admitting: Family Medicine

## 2022-02-05 ENCOUNTER — Other Ambulatory Visit (HOSPITAL_COMMUNITY): Payer: Self-pay

## 2022-02-05 VITALS — BP 118/78 | Wt 222.0 lb

## 2022-02-05 DIAGNOSIS — R41840 Attention and concentration deficit: Secondary | ICD-10-CM | POA: Diagnosis not present

## 2022-02-05 MED ORDER — AMPHETAMINE-DEXTROAMPHET ER 15 MG PO CP24
15.0000 mg | ORAL_CAPSULE | Freq: Every day | ORAL | 0 refills | Status: DC
  Filled 2022-02-05: qty 30, 30d supply, fill #0

## 2022-02-05 NOTE — Progress Notes (Signed)
    Virtual Visit via Video Note  I connected with Kathy Clark on 02/05/22 at  1:00 PM EDT by a video enabled telemedicine application and verified that I am speaking with the correct person using two identifiers.   I discussed the limitations of evaluation and management by telemedicine and the availability of in person appointments. The patient expressed understanding and agreed to proceed.  Patient location: on vacation Provider location: in office  Subjective:    CC:   Chief Complaint  Patient presents with   inattention    HPI:  ADD -she feels like the 20 mg Adderall worked better but it made her feel just a little bit racy so we had dropped her back down to 10 mg but she feels like that is not quite enough it helps some with inattention.  She also is planning on really focusing on not trying to multitask during the day and see if that is helpful as well.  She was out at the [place] today and got stung by a couple of yellow jackets that were on the boat.  She is doing well and has not had to use her EpiPen.  Past medical history, Surgical history, Family history not pertinant except as noted below, Social history, Allergies, and medications have been entered into the medical record, reviewed, and corrections made.    Objective:    General: Speaking clearly in complete sentences without any shortness of breath.  Alert and oriented x3.  Normal judgment. No apparent acute distress.    Impression and Recommendations:    Problem List Items Addressed This Visit       Other   Inattention - Primary    Recently switched off of Nuvigil to Adderall.  She would like to try something between 10 and 20 mg so we will send over Adderall XR 15 mg.  If it still not quite a good fit we could consider switching over to Vyvanse.  She can send me a note in about 4 weeks and let me know how she is doing with it.       No orders of the defined types were placed in this encounter.   Meds  ordered this encounter  Medications   amphetamine-dextroamphetamine (ADDERALL XR) 15 MG 24 hr capsule    Sig: Take 1 capsule by mouth daily.    Dispense:  30 capsule    Refill:  0    Please deliver     I discussed the assessment and treatment plan with the patient. The patient was provided an opportunity to ask questions and all were answered. The patient agreed with the plan and demonstrated an understanding of the instructions.   The patient was advised to call back or seek an in-person evaluation if the symptoms worsen or if the condition fails to improve as anticipated.  I spent 20 minutes on the day of the encounter to include pre-visit record review, face-to-face time with the patient and post visit ordering of test.   Kathy Lecher, MD

## 2022-02-05 NOTE — Assessment & Plan Note (Signed)
Recently switched off of Nuvigil to Adderall.  She would like to try something between 10 and 20 mg so we will send over Adderall XR 15 mg.  If it still not quite a good fit we could consider switching over to Vyvanse.  She can send me a note in about 4 weeks and let me know how she is doing with it.

## 2022-02-05 NOTE — Progress Notes (Signed)
Spoke w/pt she stated that she stated that she is still easily distracted, but a little more focused. She stated that the 10 mg is just enough but not quite enough and bumping up to the 20 mg is too much. She is hoping to get somewhere in the middle. She takes the capsules so she isn't able to split them.

## 2022-02-09 ENCOUNTER — Other Ambulatory Visit (HOSPITAL_COMMUNITY): Payer: Self-pay

## 2022-02-10 ENCOUNTER — Other Ambulatory Visit (HOSPITAL_COMMUNITY): Payer: Self-pay

## 2022-02-16 ENCOUNTER — Encounter: Payer: Self-pay | Admitting: Family Medicine

## 2022-02-16 ENCOUNTER — Ambulatory Visit (INDEPENDENT_AMBULATORY_CARE_PROVIDER_SITE_OTHER): Payer: 59 | Admitting: Family Medicine

## 2022-02-16 VITALS — BP 125/80 | HR 84 | Ht 68.0 in | Wt 223.0 lb

## 2022-02-16 DIAGNOSIS — Z0000 Encounter for general adult medical examination without abnormal findings: Secondary | ICD-10-CM | POA: Diagnosis not present

## 2022-02-16 NOTE — Progress Notes (Signed)
Complete physical exam  Patient: Kathy Clark   DOB: February 22, 1961   61 y.o. Female  MRN: [id number]  Subjective:    Chief Complaint  Patient presents with   Annual Exam    Kathy Clark is a 61 y.o. female who presents today for a complete physical exam. She reports consuming a general diet. She generally feels fairly well. She reports sleeping fair/chronic sleep issues. She does have additional problems to discuss today. Hx of basal cell skin cancer.  Gynecologic exam with Dr. Lenise Arena on coming up soon for Pap smear and breast exam.  M[facility] are done at [facility].   Most recent fall risk assessment:    01/09/2022    3:55 PM  [place] in the past year? 0  Number falls in past yr: 0  Injury with Fall? 0  Risk for fall due to : No Fall Risks  Follow up Falls prevention discussed     Most recent depression screenings:    02/16/2022    9:26 AM 01/09/2022    3:55 PM  PHQ 2/9 Scores  PHQ - 2 Score 0 0      Past Surgical History:  Procedure Laterality Date   ABDOMINAL HYSTERECTOMY  07/2005   [place] Bunionectomy Left 09/18/2016   Left Foot   BACK SURGERY     KNEE SURGERY  2006   left; multiple   microdiscetomy  2005   SHOULDER SURGERY  07/22/2009   right   SKIN CANCER EXCISION     eyelid   TOTAL ABDOMINAL HYSTERECTOMY     TOTAL KNEE ARTHROPLASTY  07/27/2011   Procedure: TOTAL KNEE ARTHROPLASTY;  Surgeon: Gearlean Alf;  Location: [facility];  Service: Orthopedics;  Laterality: Left;   Social History   Tobacco Use   Smoking status: Never   Smokeless tobacco: Never  Vaping Use   Vaping Use: Never used  Substance Use Topics   Alcohol use: Yes    Comment: occasionally   Drug use: No   Allergies  Allergen Reactions   Succinylcholine Anaphylaxis   Dilaudid [Hydromorphone Hcl] Nausea And Vomiting   Nsaids Other (See Comments)    Trigger colits   Plaquenil [Hydroxychloroquine] Other (See Comments)    Chronic tinnitus      Patient Care Team: Hali Marry, MD as PCP - General   Outpatient Medications Prior to Visit  Medication Sig   AMBULATORY NON FORMULARY MEDICATION Medication Name: Please decrease CPAP setting to 8 cm of water pressure and fax [place] a download after 5 days.  Is having a lot of difficulty with air leaks is working to try to reduce her pressure to see if she is still being adequately treated but with fewer leaks.FAx to [place]   amphetamine-dextroamphetamine (ADDERALL XR) 15 MG 24 hr capsule Take 1 capsule by mouth daily.   Calcium Carbonate-Vitamin D (CALTRATE 600+D PO) Take 1 tablet by mouth daily.   EPINEPHrine 0.3 mg/0.3 mL IJ SOAJ injection Inject 0.3 mg into the muscle as needed for anaphylaxis.   ergocalciferol (VITAMIN D2) 1.25 MG (50000 UT) capsule Take by mouth.   estradiol (VIVELLE-DOT) 0.075 MG/24HR Apply 1 patch twice a week   fluticasone (FLONASE) 50 MCG/ACT nasal spray Place 1 to 2 sprays into both nostrils daily.   gabapentin (NEURONTIN) 300 MG capsule TAKE 1 CAPSULE BY MOUTH EVERY MORNING AND 2 CAPSULES EVERY NIGHT AT BEDTIME   hydrochlorothiazide (HYDRODIURIL) 25 MG tablet Take 1 tablet by mouth daily.  losartan (COZAAR) 25 MG tablet TAKE 1 TABLET BY MOUTH ONCE A DAY.   Melatonin 3 MG TABS Take by mouth as directed.   methocarbamol (ROBAXIN) 500 MG tablet Take one tablet (500 mg dose) by mouth 3 (three) times a day as needed.   Multiple Vitamins-Minerals (MULTIVITAMIN ADULT PO) Take by mouth daily.   ondansetron (ZOFRAN) 4 MG tablet Take 1 tablet (4 mg total) by mouth every 8 (eight) hours as needed for nausea or vomiting.   Semaglutide-Weight Management (WEGOVY) 2.4 MG/0.75ML SOAJ Inject 2.4 mg into the skin every 7 (seven) days.   SUMAtriptan (IMITREX) 20 MG/ACT nasal spray PLACE 1 SPRAY INTO THE NOSE EVERY 2 HOURS AS NEEDED FOR HEADACHE   Upadacitinib ER (RINVOQ) 15 MG TB24 Take one tablet (15 mg dose) by mouth daily.   verapamil (CALAN-SR) 120 MG CR tablet Take 1 tablet  by mouth at  bedtime.   Vilazodone HCl 20 MG TABS Take 1 tablet (20 mg total) by mouth daily.   No facility-administered medications prior to visit.    ROS        Objective:     BP 125/80   Pulse 84   Ht '5\' 8"'$  (1.727 m)   Wt 223 lb (101.2 kg)   LMP  (LMP Unknown) Comment: hystrectomy  SpO2 99%   BMI 33.91 kg/m    Physical Exam Vitals and nursing note reviewed.  Constitutional:      Appearance: She is well-developed.  HENT:     Head: Normocephalic and atraumatic.     Right Ear: External ear normal.     Left Ear: External ear normal.     Nose: Nose normal.  Eyes:     Conjunctiva/sclera: Conjunctivae normal.     Pupils: Pupils are equal, round, and reactive to light.  Neck:     Thyroid: No thyromegaly.  Cardiovascular:     Rate and Rhythm: Normal rate and regular rhythm.     Heart sounds: Normal heart sounds.  Pulmonary:     Effort: Pulmonary effort is normal.     Breath sounds: Normal breath sounds. No wheezing.  Abdominal:     General: Bowel sounds are normal.     Palpations: Abdomen is soft.  Musculoskeletal:     Cervical back: Neck supple.  Lymphadenopathy:     Cervical: No cervical adenopathy.  Skin:    General: Skin is warm and dry.  Neurological:     Mental Status: She is alert and oriented to person, place, and time.  Psychiatric:        Mood and Affect: Mood normal.        Behavior: Behavior normal.      No results found for any visits on 02/16/22.     Assessment & Plan:    Routine Health Maintenance and Physical Exam  Immunization History  Administered Date(s) Administered   Influenza Whole 04/12/2006   Influenza,inj,Quad PF,6+ Mos 05/29/2013, 03/12/2015, 03/04/2018, 05/03/2019, 05/07/2020, 05/02/2021   Influenza-Unspecified 04/13/2017   PFIZER(Purple Top)SARS-COV-2 Vaccination 07/22/2019, 08/10/2019, 06/17/2020   Tdap 03/12/2015    Health Maintenance  Topic Date Due   INFLUENZA VACCINE  02/10/2022   COVID-19 Vaccine (4 - Booster for [place]  series) 04/20/2022 (Originally 08/12/2020)   Zoster Vaccines- Shingrix (1 of 2) 05/19/2022 (Originally 03/17/1979)   MAMMOGRAM  07/24/2023   TETANUS/TDAP  03/11/2025   COLONOSCOPY (Pts 45-21yr Insurance coverage will need to be confirmed)  11/05/2025   Hepatitis C Screening  Completed   HIV Screening  Completed  Pneumococcal Vaccine 68-59 Years old  Aged Out   HPV VACCINES  Aged Out    Discussed health benefits of physical activity, and encouraged her to engage in regular exercise appropriate for her age and condition.  Problem List Items Addressed This Visit   None Visit Diagnoses     Wellness examination    -  Primary       Keep up a regular exercise program and make sure you are eating a healthy diet Try to eat 4 servings of dairy a day, or if you are lactose intolerant take a calcium with vitamin D daily.  Your vaccines are up to date. Discussed getting yearly skin exams with her prior history of basal cell. Will get updated labs at next office visit since she just had some done with rheumatology.  No follow-ups on file.     Beatrice Lecher, MD

## 2022-02-18 ENCOUNTER — Encounter (INDEPENDENT_AMBULATORY_CARE_PROVIDER_SITE_OTHER): Payer: Self-pay

## 2022-02-19 ENCOUNTER — Encounter: Payer: Self-pay | Admitting: Family Medicine

## 2022-02-22 ENCOUNTER — Other Ambulatory Visit: Payer: Self-pay | Admitting: Family Medicine

## 2022-02-22 DIAGNOSIS — G5603 Carpal tunnel syndrome, bilateral upper limbs: Secondary | ICD-10-CM

## 2022-02-23 ENCOUNTER — Other Ambulatory Visit (HOSPITAL_COMMUNITY): Payer: Self-pay

## 2022-02-23 MED ORDER — SUMATRIPTAN 20 MG/ACT NA SOLN
NASAL | 0 refills | Status: DC
  Filled 2022-02-23: qty 6, 30d supply, fill #0

## 2022-02-23 MED ORDER — GABAPENTIN 300 MG PO CAPS
ORAL_CAPSULE | ORAL | 1 refills | Status: DC
  Filled 2022-02-23: qty 270, 90d supply, fill #0
  Filled 2022-05-28: qty 270, 90d supply, fill #1

## 2022-02-23 MED ORDER — VILAZODONE HCL 20 MG PO TABS
1.0000 | ORAL_TABLET | Freq: Every day | ORAL | 0 refills | Status: DC
  Filled 2022-02-23: qty 90, 90d supply, fill #0
  Filled 2022-04-27: qty 30, 30d supply, fill #0
  Filled 2022-04-29: qty 60, 60d supply, fill #0

## 2022-02-24 ENCOUNTER — Other Ambulatory Visit (HOSPITAL_COMMUNITY): Payer: Self-pay

## 2022-02-25 ENCOUNTER — Other Ambulatory Visit (HOSPITAL_COMMUNITY): Payer: Self-pay

## 2022-02-25 DIAGNOSIS — G4733 Obstructive sleep apnea (adult) (pediatric): Secondary | ICD-10-CM | POA: Diagnosis not present

## 2022-02-26 DIAGNOSIS — Z79899 Other long term (current) drug therapy: Secondary | ICD-10-CM | POA: Diagnosis not present

## 2022-02-26 DIAGNOSIS — M0609 Rheumatoid arthritis without rheumatoid factor, multiple sites: Secondary | ICD-10-CM | POA: Diagnosis not present

## 2022-03-03 ENCOUNTER — Other Ambulatory Visit: Payer: Self-pay | Admitting: Family Medicine

## 2022-03-03 ENCOUNTER — Other Ambulatory Visit: Payer: Self-pay | Admitting: Pharmacist

## 2022-03-03 ENCOUNTER — Other Ambulatory Visit (HOSPITAL_COMMUNITY): Payer: Self-pay

## 2022-03-03 MED ORDER — RINVOQ 15 MG PO TB24
ORAL_TABLET | ORAL | 2 refills | Status: DC
  Filled 2022-03-03: qty 30, 30d supply, fill #0
  Filled 2022-04-01: qty 30, 30d supply, fill #1
  Filled 2022-05-04: qty 30, 30d supply, fill #2

## 2022-03-03 MED ORDER — AMPHETAMINE-DEXTROAMPHET ER 15 MG PO CP24
15.0000 mg | ORAL_CAPSULE | Freq: Every day | ORAL | 0 refills | Status: DC
  Filled 2022-03-03: qty 30, 30d supply, fill #0

## 2022-03-03 MED ORDER — RINVOQ 15 MG PO TB24
ORAL_TABLET | ORAL | 2 refills | Status: DC
Start: 2022-03-03 — End: 2022-03-03

## 2022-03-10 ENCOUNTER — Other Ambulatory Visit (HOSPITAL_COMMUNITY): Payer: Self-pay

## 2022-03-12 ENCOUNTER — Other Ambulatory Visit (HOSPITAL_COMMUNITY): Payer: Self-pay

## 2022-03-12 DIAGNOSIS — Z01411 Encounter for gynecological examination (general) (routine) with abnormal findings: Secondary | ICD-10-CM | POA: Diagnosis not present

## 2022-03-12 DIAGNOSIS — Z113 Encounter for screening for infections with a predominantly sexual mode of transmission: Secondary | ICD-10-CM | POA: Diagnosis not present

## 2022-03-12 DIAGNOSIS — Z124 Encounter for screening for malignant neoplasm of cervix: Secondary | ICD-10-CM | POA: Diagnosis not present

## 2022-03-12 DIAGNOSIS — Z79890 Hormone replacement therapy (postmenopausal): Secondary | ICD-10-CM | POA: Diagnosis not present

## 2022-03-12 DIAGNOSIS — Z01419 Encounter for gynecological examination (general) (routine) without abnormal findings: Secondary | ICD-10-CM | POA: Diagnosis not present

## 2022-03-12 DIAGNOSIS — Z90710 Acquired absence of both cervix and uterus: Secondary | ICD-10-CM | POA: Diagnosis not present

## 2022-03-12 DIAGNOSIS — Z6834 Body mass index (BMI) 34.0-34.9, adult: Secondary | ICD-10-CM | POA: Diagnosis not present

## 2022-03-12 DIAGNOSIS — Z0142 Encounter for cervical smear to confirm findings of recent normal smear following initial abnormal smear: Secondary | ICD-10-CM | POA: Diagnosis not present

## 2022-03-12 DIAGNOSIS — Z803 Family history of malignant neoplasm of breast: Secondary | ICD-10-CM | POA: Diagnosis not present

## 2022-03-12 MED ORDER — ESTRADIOL 0.075 MG/24HR TD PTTW
MEDICATED_PATCH | TRANSDERMAL | 3 refills | Status: DC
  Filled 2022-03-12: qty 24, 84d supply, fill #0
  Filled 2022-06-22: qty 24, 84d supply, fill #1
  Filled 2022-09-10: qty 24, 84d supply, fill #2
  Filled 2022-11-30: qty 24, 84d supply, fill #3

## 2022-03-17 ENCOUNTER — Other Ambulatory Visit (HOSPITAL_COMMUNITY): Payer: Self-pay

## 2022-03-28 DIAGNOSIS — G4733 Obstructive sleep apnea (adult) (pediatric): Secondary | ICD-10-CM | POA: Diagnosis not present

## 2022-04-01 ENCOUNTER — Other Ambulatory Visit (HOSPITAL_COMMUNITY): Payer: Self-pay

## 2022-04-01 DIAGNOSIS — G4733 Obstructive sleep apnea (adult) (pediatric): Secondary | ICD-10-CM | POA: Diagnosis not present

## 2022-04-06 ENCOUNTER — Other Ambulatory Visit: Payer: Self-pay | Admitting: Family Medicine

## 2022-04-07 ENCOUNTER — Other Ambulatory Visit (HOSPITAL_COMMUNITY): Payer: Self-pay

## 2022-04-07 MED ORDER — AMPHETAMINE-DEXTROAMPHET ER 15 MG PO CP24
15.0000 mg | ORAL_CAPSULE | Freq: Every day | ORAL | 0 refills | Status: DC
  Filled 2022-04-07: qty 30, 30d supply, fill #0

## 2022-04-08 ENCOUNTER — Other Ambulatory Visit (HOSPITAL_COMMUNITY): Payer: Self-pay

## 2022-04-09 ENCOUNTER — Other Ambulatory Visit (HOSPITAL_COMMUNITY): Payer: Self-pay

## 2022-04-27 DIAGNOSIS — G4733 Obstructive sleep apnea (adult) (pediatric): Secondary | ICD-10-CM | POA: Diagnosis not present

## 2022-04-28 ENCOUNTER — Other Ambulatory Visit (HOSPITAL_COMMUNITY): Payer: Self-pay

## 2022-04-29 ENCOUNTER — Other Ambulatory Visit (HOSPITAL_COMMUNITY): Payer: Self-pay

## 2022-04-30 ENCOUNTER — Ambulatory Visit: Payer: 59 | Admitting: Family Medicine

## 2022-04-30 ENCOUNTER — Other Ambulatory Visit (HOSPITAL_COMMUNITY): Payer: Self-pay

## 2022-04-30 VITALS — BP 123/76 | HR 80 | Ht 68.0 in | Wt 221.0 lb

## 2022-04-30 DIAGNOSIS — I10 Essential (primary) hypertension: Secondary | ICD-10-CM | POA: Diagnosis not present

## 2022-04-30 DIAGNOSIS — Z23 Encounter for immunization: Secondary | ICD-10-CM

## 2022-04-30 DIAGNOSIS — R5383 Other fatigue: Secondary | ICD-10-CM

## 2022-04-30 DIAGNOSIS — Z7689 Persons encountering health services in other specified circumstances: Secondary | ICD-10-CM

## 2022-04-30 MED ORDER — LISDEXAMFETAMINE DIMESYLATE 30 MG PO CAPS
30.0000 mg | ORAL_CAPSULE | Freq: Every day | ORAL | 0 refills | Status: DC
  Filled 2022-04-30 – 2022-05-04 (×2): qty 30, 30d supply, fill #0

## 2022-04-30 NOTE — Progress Notes (Signed)
Pt would like to discuss the dosage of the Adderall. She is currently taking 15 mg and doesn't feel that it is effective.

## 2022-04-30 NOTE — Progress Notes (Signed)
Established Patient Office Visit  Subjective   Patient ID: Kathy Clark, female    DOB: 13-Nov-1960  Age: 61 y.o. MRN: [id number]  Chief Complaint  Patient presents with   Follow-up         HPI  Hypertension- Pt denies chest pain, SOB, dizziness, or heart palpitations.  Taking meds as directed w/o problems.  Denies medication side effects.    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  She decided to take a break from some of her extracurricular activities for this month.  She just feels like the Adderall 15 mg that she is taking is not as effective its not helping her stay a little bit more organized and on task.  She had previously taken 20 mg but it was too stimulating and caused side effects.  She still dealing with chronic sleep issues.  Regards to her rheumatoid arthritis she is currently on Rinvoq and actually doing really well so she does not want to discontinue the medication unless needed but would like to go ahead and get her vaccines today.     ROS    Objective:     BP 123/76   Pulse 80   Ht '5\' 8"'$  (1.727 m)   Wt 221 lb (100.2 kg)   LMP  (LMP Unknown) Comment: hystrectomy  SpO2 100%   BMI 33.60 kg/m    Physical Exam Vitals and nursing note reviewed.  Constitutional:      Appearance: She is well-developed.  HENT:     Head: Normocephalic and atraumatic.  Cardiovascular:     Rate and Rhythm: Normal rate and regular rhythm.     Heart sounds: Normal heart sounds.  Pulmonary:     Effort: Pulmonary effort is normal.     Breath sounds: Normal breath sounds.  Skin:    General: Skin is warm and dry.  Neurological:     Mental Status: She is alert and oriented to person, place, and time.  Psychiatric:        Behavior: Behavior normal.      No results found for any visits on 04/30/22.    The 10-year ASCVD risk score (Arnett DK, et al.,  2019) is: 5%    Assessment & Plan:   Problem List Items Addressed This Visit       Cardiovascular and Mediastinum   HYPERTENSION, BENIGN ESSENTIAL - Primary    Pressure looks phenomenal today.        Other   Other fatigue    Follow-up chronic fatigue and inattention.  We discussed options for Adderall.  She felt a little too hyped up when she was taking 20 mg, but 10 mg was not effective so she has been on 15.  But feels like more recently it is just not been that helpful.  We discussed alternative options.  I think a trial of Vyvanse would be worthwhile for at least a month and then we can always adjust the dose if needed.  Or we could even consider going to methylphenidate.      Relevant Medications   lisdexamfetamine (VYVANSE) 30 MG capsule   Encounter for weight management    Visit # 8 Starting Weight: 252 lbs.     Current weight: 221 lbs.  Previous weight: 227 lbs Change in weight: Down 6 lbs from last OV Goal  weight: 185 lb  Dietary goals: Protein with each meal, portion control Continue  MyFitness Pal. Exercise goals: starting water aerobics. RA is limitng factor. Start Bloom App. Medication: Continue Wegovy 2.4 mg.  Follow-up :12 weeks      Relevant Medications   Semaglutide-Weight Management (WEGOVY) 2.4 MG/0.75ML SOAJ   Other Visit Diagnoses     Need for immunization against influenza       Relevant Orders   Flu Vaccine QUAD 49moIM (Fluarix, Fluzone & Alfiuria Quad PF) (Completed)   Need for COVID-19 vaccine       Relevant Orders   Pfizer Fall 2023 Covid-19 Vaccine 126yrand older (Completed)       No follow-ups on file.    CaBeatrice LecherMD

## 2022-05-01 ENCOUNTER — Other Ambulatory Visit (HOSPITAL_COMMUNITY): Payer: Self-pay

## 2022-05-01 ENCOUNTER — Encounter: Payer: Self-pay | Admitting: Family Medicine

## 2022-05-01 MED ORDER — WEGOVY 2.4 MG/0.75ML ~~LOC~~ SOAJ
2.4000 mg | SUBCUTANEOUS | 3 refills | Status: DC
  Filled 2022-05-01: qty 3, 28d supply, fill #0
  Filled 2022-05-28: qty 3, 28d supply, fill #1
  Filled 2022-06-26 (×2): qty 3, 28d supply, fill #2
  Filled 2022-07-22: qty 3, 28d supply, fill #3

## 2022-05-01 NOTE — Assessment & Plan Note (Signed)
Follow-up chronic fatigue and inattention.  We discussed options for Adderall.  She felt a little too hyped up when she was taking 20 mg, but 10 mg was not effective so she has been on 15.  But feels like more recently it is just not been that helpful.  We discussed alternative options.  I think a trial of Vyvanse would be worthwhile for at least a month and then we can always adjust the dose if needed.  Or we could even consider going to methylphenidate.

## 2022-05-01 NOTE — Assessment & Plan Note (Signed)
Pressure looks phenomenal today.

## 2022-05-01 NOTE — Assessment & Plan Note (Signed)
Visit #8 Starting Weight:252 lbs.  Current weight:221lbs. Previous weight:227lbs Change in weight:Down6lbsfrom last OV Goal weight:185 lb Dietary goals:Protein with each meal, portion controlContinueMyFitness Pal. Exercise goals:starting water aerobics. RA is limitng factor.Start Bloom App. Medication:ContinueWegovy 2.4 mg. Follow-up:12 weeks

## 2022-05-04 ENCOUNTER — Other Ambulatory Visit (HOSPITAL_COMMUNITY): Payer: Self-pay

## 2022-05-14 ENCOUNTER — Other Ambulatory Visit (HOSPITAL_COMMUNITY): Payer: Self-pay

## 2022-05-15 ENCOUNTER — Telehealth: Payer: 59 | Admitting: Physician Assistant

## 2022-05-15 DIAGNOSIS — J028 Acute pharyngitis due to other specified organisms: Secondary | ICD-10-CM

## 2022-05-15 DIAGNOSIS — B9689 Other specified bacterial agents as the cause of diseases classified elsewhere: Secondary | ICD-10-CM

## 2022-05-15 MED ORDER — AMOXICILLIN 500 MG PO CAPS: 500.0000 mg | ORAL_CAPSULE | Freq: Two times a day (BID) | ORAL | 0 refills | Status: AC

## 2022-05-15 NOTE — Progress Notes (Signed)

## 2022-05-28 ENCOUNTER — Other Ambulatory Visit (HOSPITAL_COMMUNITY): Payer: Self-pay

## 2022-05-28 DIAGNOSIS — G4733 Obstructive sleep apnea (adult) (pediatric): Secondary | ICD-10-CM | POA: Diagnosis not present

## 2022-05-29 ENCOUNTER — Other Ambulatory Visit (HOSPITAL_COMMUNITY): Payer: Self-pay

## 2022-06-01 ENCOUNTER — Other Ambulatory Visit (HOSPITAL_COMMUNITY): Payer: Self-pay

## 2022-06-01 ENCOUNTER — Encounter: Payer: Self-pay | Admitting: Family Medicine

## 2022-06-01 ENCOUNTER — Telehealth (INDEPENDENT_AMBULATORY_CARE_PROVIDER_SITE_OTHER): Payer: 59 | Admitting: Family Medicine

## 2022-06-01 VITALS — BP 121/80 | HR 73 | Temp 97.8°F | Wt 219.0 lb

## 2022-06-01 DIAGNOSIS — Z7689 Persons encountering health services in other specified circumstances: Secondary | ICD-10-CM | POA: Diagnosis not present

## 2022-06-01 DIAGNOSIS — R41840 Attention and concentration deficit: Secondary | ICD-10-CM

## 2022-06-01 DIAGNOSIS — R5383 Other fatigue: Secondary | ICD-10-CM | POA: Diagnosis not present

## 2022-06-01 MED ORDER — LISDEXAMFETAMINE DIMESYLATE 40 MG PO CAPS
40.0000 mg | ORAL_CAPSULE | Freq: Every day | ORAL | 0 refills | Status: DC
  Filled 2022-06-01: qty 30, 30d supply, fill #0

## 2022-06-01 NOTE — Assessment & Plan Note (Signed)
Visit # 9 Starting Weight: 252 lbs.     Current weight: 219 lbs.  Previous weight: 221 lbs Change in weight: Down 3 lbs from last OV Goal weight: 185 lb  Dietary goals: Protein with each meal, portion control Continue. Continue MyFitness Pal. Exercise goals: + water aerobics. RA is limitng factor.  Planning on discussing with rheumatologist working with a personal trainer to add in some resistance training.  Start Bloom App. Medication: Continue Wegovy 2.4 mg.  Follow-up :12 weeks

## 2022-06-01 NOTE — Progress Notes (Signed)
Virtual Visit via Video Note  I connected with Kathy Clark on 06/01/22 at  3:20 PM EST by a video enabled telemedicine application and verified that I am speaking with the correct person using two identifiers.   I discussed the limitations of evaluation and management by telemedicine and the availability of in person appointments. The patient expressed understanding and agreed to proceed.  Patient location: at home Provider location: in office  Subjective:    CC:   Chief Complaint  Patient presents with   Weight Check    HPI: She is here today to follow-up we switched to Vyvanse last month off of Adderall.  So far tolerating it well without any major side effects.  She does notice a little delayed release of action.  She feels like she could probably focus a little bit better and so is interested in maybe a higher dose.  Weight management-she has been going to water aerobics.  I have a morning class once a week and then she has been trying to go on her lunch break a couple times a week as well.  She is down to 219 pounds.  Still having to grab food on the go occasionally.   Past medical history, Surgical history, Family history not pertinant except as noted below, Social history, Allergies, and medications have been entered into the medical record, reviewed, and corrections made.    Objective:    General: Speaking clearly in complete sentences without any shortness of breath.  Alert and oriented x3.  Normal judgment. No apparent acute distress.    Impression and Recommendations:    Problem List Items Addressed This Visit       Other   Other fatigue   Relevant Medications   lisdexamfetamine (VYVANSE) 40 MG capsule   Inattention - Primary    So far Vyvanse seems to be doing well overall just seems to have a later kick in of the medication.  So she is can plan on try to take it a little bit earlier and see if that is helpful.  She would like to try a higher dose.   Blood pressures have looked great on the medication and she has not noticed any known negative side effects.      Relevant Medications   lisdexamfetamine (VYVANSE) 40 MG capsule   Encounter for weight management    Visit # 9 Starting Weight: 252 lbs.     Current weight: 219 lbs.  Previous weight: 221 lbs Change in weight: Down 3 lbs from last OV Goal weight: 185 lb  Dietary goals: Protein with each meal, portion control Continue. Continue MyFitness Pal. Exercise goals: + water aerobics. RA is limitng factor.  Planning on discussing with rheumatologist working with a personal trainer to add in some resistance training.  Start Bloom App. Medication: Continue Wegovy 2.4 mg.  Follow-up :12 weeks       No orders of the defined types were placed in this encounter.   Meds ordered this encounter  Medications   lisdexamfetamine (VYVANSE) 40 MG capsule    Sig: Take 1 capsule (40 mg total) by mouth daily.    Dispense:  30 capsule    Refill:  0    Please mail     I discussed the assessment and treatment plan with the patient. The patient was provided an opportunity to ask questions and all were answered. The patient agreed with the plan and demonstrated an understanding of the instructions.   The patient  was advised to call back or seek an in-person evaluation if the symptoms worsen or if the condition fails to improve as anticipated.   Beatrice Lecher, MD

## 2022-06-01 NOTE — Assessment & Plan Note (Signed)
So far Vyvanse seems to be doing well overall just seems to have a later kick in of the medication.  So she is can plan on try to take it a little bit earlier and see if that is helpful.  She would like to try a higher dose.  Blood pressures have looked great on the medication and she has not noticed any known negative side effects.

## 2022-06-02 ENCOUNTER — Other Ambulatory Visit (HOSPITAL_COMMUNITY): Payer: Self-pay

## 2022-06-04 ENCOUNTER — Telehealth: Payer: 59 | Admitting: Physician Assistant

## 2022-06-04 DIAGNOSIS — T3695XA Adverse effect of unspecified systemic antibiotic, initial encounter: Secondary | ICD-10-CM | POA: Diagnosis not present

## 2022-06-04 DIAGNOSIS — B379 Candidiasis, unspecified: Secondary | ICD-10-CM

## 2022-06-04 MED ORDER — FLUCONAZOLE 150 MG PO TABS: 150.0000 mg | ORAL_TABLET | ORAL | 0 refills | Status: DC | PRN

## 2022-06-04 NOTE — Progress Notes (Signed)

## 2022-06-05 ENCOUNTER — Other Ambulatory Visit (HOSPITAL_COMMUNITY): Payer: Self-pay

## 2022-06-08 ENCOUNTER — Other Ambulatory Visit: Payer: Self-pay | Admitting: Pharmacist

## 2022-06-08 ENCOUNTER — Other Ambulatory Visit (HOSPITAL_COMMUNITY): Payer: Self-pay

## 2022-06-08 MED ORDER — RINVOQ 15 MG PO TB24
15.0000 mg | ORAL_TABLET | Freq: Every day | ORAL | 2 refills | Status: DC
  Filled 2022-06-08: qty 30, 30d supply, fill #0
  Filled 2022-06-30 – 2022-07-14 (×3): qty 30, 30d supply, fill #1
  Filled 2022-08-09 – 2022-08-10 (×2): qty 30, 30d supply, fill #2

## 2022-06-08 MED ORDER — RINVOQ 15 MG PO TB24
15.0000 mg | ORAL_TABLET | Freq: Every day | ORAL | 2 refills | Status: DC
  Filled 2022-06-08: qty 30, 30d supply, fill #0

## 2022-06-11 ENCOUNTER — Other Ambulatory Visit (HOSPITAL_COMMUNITY): Payer: Self-pay

## 2022-06-15 ENCOUNTER — Encounter: Payer: Self-pay | Admitting: Family Medicine

## 2022-06-15 DIAGNOSIS — M0609 Rheumatoid arthritis without rheumatoid factor, multiple sites: Secondary | ICD-10-CM

## 2022-06-15 NOTE — Telephone Encounter (Signed)
Do you have a recommendation?

## 2022-06-18 DIAGNOSIS — M0609 Rheumatoid arthritis without rheumatoid factor, multiple sites: Secondary | ICD-10-CM | POA: Diagnosis not present

## 2022-06-18 DIAGNOSIS — Z79899 Other long term (current) drug therapy: Secondary | ICD-10-CM | POA: Diagnosis not present

## 2022-06-22 ENCOUNTER — Other Ambulatory Visit (HOSPITAL_COMMUNITY): Payer: Self-pay

## 2022-06-26 ENCOUNTER — Other Ambulatory Visit (HOSPITAL_COMMUNITY): Payer: Self-pay

## 2022-06-26 ENCOUNTER — Other Ambulatory Visit: Payer: Self-pay | Admitting: Family Medicine

## 2022-06-26 ENCOUNTER — Other Ambulatory Visit: Payer: Self-pay

## 2022-06-26 DIAGNOSIS — R41840 Attention and concentration deficit: Secondary | ICD-10-CM

## 2022-06-26 DIAGNOSIS — R5383 Other fatigue: Secondary | ICD-10-CM

## 2022-06-26 MED ORDER — LISDEXAMFETAMINE DIMESYLATE 40 MG PO CAPS
40.0000 mg | ORAL_CAPSULE | Freq: Every day | ORAL | 0 refills | Status: DC
  Filled 2022-06-26 – 2022-06-29 (×3): qty 30, 30d supply, fill #0

## 2022-06-29 ENCOUNTER — Other Ambulatory Visit: Payer: Self-pay

## 2022-06-29 ENCOUNTER — Other Ambulatory Visit (HOSPITAL_COMMUNITY): Payer: Self-pay

## 2022-06-30 ENCOUNTER — Other Ambulatory Visit: Payer: Self-pay

## 2022-07-08 ENCOUNTER — Telehealth: Payer: 59 | Admitting: Physician Assistant

## 2022-07-08 DIAGNOSIS — B9689 Other specified bacterial agents as the cause of diseases classified elsewhere: Secondary | ICD-10-CM

## 2022-07-08 DIAGNOSIS — J019 Acute sinusitis, unspecified: Secondary | ICD-10-CM

## 2022-07-08 MED ORDER — DOXYCYCLINE HYCLATE 100 MG PO TABS: 100.0000 mg | ORAL_TABLET | Freq: Two times a day (BID) | ORAL | 0 refills | Status: DC

## 2022-07-08 NOTE — Progress Notes (Signed)

## 2022-07-08 NOTE — Progress Notes (Signed)
I have spent 5 minutes in review of e-visit questionnaire, review and updating patient chart, medical decision making and response to patient.   Kathy Clark Cody Shiv Shuey, PA-C    

## 2022-07-09 ENCOUNTER — Telehealth: Payer: 59 | Admitting: Family Medicine

## 2022-07-09 DIAGNOSIS — U071 COVID-19: Secondary | ICD-10-CM | POA: Diagnosis not present

## 2022-07-09 MED ORDER — PROMETHAZINE-DM 6.25-15 MG/5ML PO SYRP: 5.0000 mL | ORAL_SOLUTION | Freq: Four times a day (QID) | ORAL | 0 refills | Status: DC | PRN

## 2022-07-09 MED ORDER — PREDNISONE 20 MG PO TABS: 40.0000 mg | ORAL_TABLET | Freq: Every day | ORAL | 0 refills | Status: AC

## 2022-07-09 MED ORDER — NIRMATRELVIR/RITONAVIR (PAXLOVID)TABLET: 3.0000 | ORAL_TABLET | Freq: Two times a day (BID) | ORAL | 0 refills | Status: AC

## 2022-07-09 NOTE — Patient Instructions (Signed)
Kathy Clark, thank you for joining [name], NP for today's virtual visit.  While this provider is not your primary care provider (PCP), if your PCP is located in our provider database this encounter information will be shared with them immediately following your visit.   [facility] account gives you access to today's visit and all your visits, tests, and labs performed at [facility] " click here if you don't have a [facility] account or go to [email or web address]  Consent: (Patient) Kathy Clark provided verbal consent for this virtual visit at the beginning of the encounter.  Current Medications:  Current Outpatient Medications:    nirmatrelvir/ritonavir (PAXLOVID) 20 x 150 MG & 10 x '100MG'$  TABS, Take 3 tablets by mouth 2 (two) times daily for 5 days. (Take nirmatrelvir 150 mg two tablets twice daily for 5 days and ritonavir 100 mg one tablet twice daily for 5 days) Patient GFR is 74 06/18/2022, Disp: 30 tablet, Rfl: 0   predniSONE (DELTASONE) 20 MG tablet, Take 2 tablets (40 mg total) by mouth daily with breakfast for 5 days., Disp: 10 tablet, Rfl: 0   promethazine-dextromethorphan (PROMETHAZINE-DM) 6.25-15 MG/5ML syrup, Take 5 mLs by mouth 4 (four) times daily as needed for cough., Disp: 118 mL, Rfl: 0   AMBULATORY NON FORMULARY MEDICATION, Medication Name: Please decrease CPAP setting to 8 cm of water pressure and fax [place] a download after 5 days.  Is having a lot of difficulty with air leaks is working to try to reduce her pressure to see if she is still being adequately treated but with fewer leaks.FAx to [place], Disp: 1 Units, Rfl: 0   Calcium Carbonate-Vitamin D (CALTRATE 600+D PO), Take 1 tablet by mouth daily., Disp: , Rfl:    doxycycline (VIBRA-TABS) 100 MG tablet, Take 1 tablet (100 mg total) by mouth 2 (two) times daily., Disp: 20 tablet, Rfl: 0   EPINEPHrine 0.3 mg/0.3 mL IJ SOAJ injection, Inject 0.3 mg into the muscle as  needed for anaphylaxis., Disp: 2 each, Rfl: PRN   ergocalciferol (VITAMIN D2) 1.25 MG (50000 UT) capsule, Take by mouth., Disp: , Rfl:    estradiol (VIVELLE-DOT) 0.075 MG/24HR, Apply 1 patch to skin twice weekly as directed, Disp: 24 patch, Rfl: 3   fluconazole (DIFLUCAN) 150 MG tablet, Take 1 tablet (150 mg total) by mouth every 3 (three) days as needed., Disp: 2 tablet, Rfl: 0   fluticasone (FLONASE) 50 MCG/ACT nasal spray, Place 1 to 2 sprays into both nostrils daily., Disp: 48 g, Rfl: 3   gabapentin (NEURONTIN) 300 MG capsule, TAKE 1 CAPSULE BY MOUTH EVERY MORNING AND 2 CAPSULES EVERY NIGHT AT BEDTIME, Disp: 270 capsule, Rfl: 1   hydrochlorothiazide (HYDRODIURIL) 25 MG tablet, Take 1 tablet by mouth daily., Disp: 90 tablet, Rfl: 3   lisdexamfetamine (VYVANSE) 40 MG capsule, Take 1 capsule (40 mg total) by mouth daily., Disp: 30 capsule, Rfl: 0   losartan (COZAAR) 25 MG tablet, TAKE 1 TABLET BY MOUTH ONCE A DAY., Disp: 90 tablet, Rfl: 3   Melatonin 3 MG TABS, Take by mouth as directed., Disp: , Rfl:    methocarbamol (ROBAXIN) 500 MG tablet, Take one tablet (500 mg dose) by mouth 3 (three) times a day as needed., Disp: 90 tablet, Rfl: 2   Multiple Vitamins-Minerals (MULTIVITAMIN ADULT PO), Take by mouth daily., Disp: , Rfl:    ondansetron (ZOFRAN) 4 MG tablet, Take 1 tablet (4 mg total) by mouth every 8 (eight)  hours as needed for nausea or vomiting., Disp: 15 tablet, Rfl: 0   Semaglutide-Weight Management (WEGOVY) 2.4 MG/0.75ML SOAJ, Inject 2.4 mg into the skin every 7 (seven) days., Disp: 3 mL, Rfl: 3   SUMAtriptan (IMITREX) 20 MG/ACT nasal spray, PLACE 1 SPRAY INTO THE NOSE EVERY 2 HOURS AS NEEDED FOR HEADACHE, Disp: 6 each, Rfl: 0   Upadacitinib ER (RINVOQ) 15 MG TB24, Take 1 tablet by mouth daily., Disp: 30 tablet, Rfl: 2   verapamil (CALAN-SR) 120 MG CR tablet, Take 1 tablet  by mouth at bedtime., Disp: 90 tablet, Rfl: 3   Vilazodone HCl 20 MG TABS, Take 1 tablet (20 mg total) by mouth  daily., Disp: 90 tablet, Rfl: 0   Medications ordered in this encounter:  Meds ordered this encounter  Medications   nirmatrelvir/ritonavir (PAXLOVID) 20 x 150 MG & 10 x '100MG'$  TABS    Sig: Take 3 tablets by mouth 2 (two) times daily for 5 days. (Take nirmatrelvir 150 mg two tablets twice daily for 5 days and ritonavir 100 mg one tablet twice daily for 5 days) Patient GFR is 74 06/18/2022    Dispense:  30 tablet    Refill:  0    Order Specific Question:   Supervising Provider    Answer:   Chase Picket [[id number]]   predniSONE (DELTASONE) 20 MG tablet    Sig: Take 2 tablets (40 mg total) by mouth daily with breakfast for 5 days.    Dispense:  10 tablet    Refill:  0    Order Specific Question:   Supervising Provider    Answer:   Chase Picket [id number]   promethazine-dextromethorphan (PROMETHAZINE-DM) 6.25-15 MG/5ML syrup    Sig: Take 5 mLs by mouth 4 (four) times daily as needed for cough.    Dispense:  118 mL    Refill:  0    Order Specific Question:   Supervising Provider    Answer:   Chase Picket [[id number]]     *If you need refills on other medications prior to your next appointment, please contact your pharmacy*  Follow-Up: Call back or seek an in-person evaluation if the symptoms worsen or if the condition fails to improve as anticipated.  [place] [phone]  Other Instructions  Please keep well-hydrated and get plenty of rest. Start a saline nasal rinse to flush out your nasal passages. You can use plain Mucinex to help thin congestion. If you have a humidifier, running in the bedroom at night. I want you to start OTC vitamin D3 1000 units daily, vitamin C 1000 mg daily, and a zinc supplement. Please take prescribed medications as directed.  You have been enrolled in a MyChart symptom monitoring program. Please answer these questions daily so we can keep track of how you are doing.  You were to quarantine for 5 days from onset of your  symptoms.  After day 5, if you have had no fever and you are feeling better, you can end quarantine but need to mask for an additional 5 days. After day 5 if you have a fever or are having significant symptoms, please quarantine for full 10 days.  If you note any worsening of symptoms, any significant shortness of breath or any chest pain, please seek ER evaluation ASAP.  Please do not delay care!  COVID-19: What to Do if You Are Sick If you test positive and are an older adult or someone who is at high risk of  getting very sick from COVID-19, treatment may be available. Contact a healthcare provider right away after a positive test to determine if you are eligible, even if your symptoms are mild right now. You can also visit a Test to Treat location and, if eligible, receive a prescription from a provider. Don't delay: Treatment must be started within the first few days to be effective. If you have a fever, cough, or other symptoms, you might have COVID-19. Most people have mild illness and are able to recover at home. If you are sick: Keep track of your symptoms. If you have an emergency warning sign (including trouble breathing), call 911. Steps to help prevent the spread of COVID-19 if you are sick If you are sick with COVID-19 or think you might have COVID-19, follow the steps below to care for yourself and to help protect other people in your home and community. Stay home except to get medical care Stay home. Most people with COVID-19 have mild illness and can recover at home without medical care. Do not leave your home, except to get medical care. Do not visit public areas and do not go to places where you are unable to wear a mask. Take care of yourself. Get rest and stay hydrated. Take over-the-counter medicines, such as acetaminophen, to help you feel better. Stay in touch with your doctor. Call before you get medical care. Be sure to get care if you have trouble breathing, or have any other  emergency warning signs, or if you think it is an emergency. Avoid public transportation, ride-sharing, or taxis if possible. Get tested If you have symptoms of COVID-19, get tested. While waiting for test results, stay away from others, including staying apart from those living in your household. Get tested as soon as possible after your symptoms start. Treatments may be available for people with COVID-19 who are at risk for becoming very sick. Don't delay: Treatment must be started early to be effective--some treatments must begin within 5 days of your first symptoms. Contact your healthcare provider right away if your test result is positive to determine if you are eligible. Self-tests are one of several options for testing for the virus that causes COVID-19 and may be more convenient than laboratory-based tests and point-of-care tests. Ask your healthcare provider or your local health department if you need help interpreting your test results. You can visit your state, tribal, local, and territorial health department's website to look for the latest local information on testing sites. Separate yourself from other people As much as possible, stay in a specific room and away from other people and pets in your home. If possible, you should use a separate bathroom. If you need to be around other people or animals in or outside of the home, wear a well-fitting mask. Tell your close contacts that they may have been exposed to COVID-19. An infected person can spread COVID-19 starting 48 hours (or 2 days) before the person has any symptoms or tests positive. By letting your close contacts know they may have been exposed to COVID-19, you are helping to protect everyone. See COVID-19 and Animals if you have questions about pets. If you are diagnosed with COVID-19, someone from the health department may call you. Answer the call to slow the spread. Monitor your symptoms Symptoms of COVID-19 include fever,  cough, or other symptoms. Follow care instructions from your healthcare provider and local health department. Your local health authorities may give instructions on checking your symptoms and  reporting information. When to seek emergency medical attention Look for emergency warning signs* for COVID-19. If someone is showing any of these signs, seek emergency medical care immediately: Trouble breathing Persistent pain or pressure in the chest New confusion Inability to wake or stay awake Pale, gray, or blue-colored skin, lips, or nail beds, depending on skin tone *This list is not all possible symptoms. Please call your medical provider for any other symptoms that are severe or concerning to you. Call 911 or call ahead to your local emergency facility: Notify the operator that you are seeking care for someone who has or may have COVID-19. Call ahead before visiting your doctor Call ahead. Many medical visits for routine care are being postponed or done by phone or telemedicine. If you have a medical appointment that cannot be postponed, call your doctor's office, and tell them you have or may have COVID-19. This will help the office protect themselves and other patients. If you are sick, wear a well-fitting mask You should wear a mask if you must be around other people or animals, including pets (even at home). Wear a mask with the best fit, protection, and comfort for you. You don't need to wear the mask if you are alone. If you can't put on a mask (because of trouble breathing, for example), cover your coughs and sneezes in some other way. Try to stay at least 6 feet away from other people. This will help protect the people around you. Masks should not be placed on young children under age 64 years, anyone who has trouble breathing, or anyone who is not able to remove the mask without help. Cover your coughs and sneezes Cover your mouth and nose with a tissue when you cough or sneeze. Throw away  used tissues in a lined trash can. Immediately wash your hands with soap and water for at least 20 seconds. If soap and water are not available, clean your hands with an alcohol-based hand sanitizer that contains at least 60% alcohol. Clean your hands often Wash your hands often with soap and water for at least 20 seconds. This is especially important after blowing your nose, coughing, or sneezing; going to the bathroom; and before eating or preparing food. Use hand sanitizer if soap and water are not available. Use an alcohol-based hand sanitizer with at least 60% alcohol, covering all surfaces of your hands and rubbing them together until they feel dry. Soap and water are the best option, especially if hands are visibly dirty. Avoid touching your eyes, nose, and mouth with unwashed hands. Handwashing Tips Avoid sharing personal household items Do not share dishes, drinking glasses, cups, eating utensils, towels, or bedding with other people in your home. Wash these items thoroughly after using them with soap and water or put in the dishwasher. Clean surfaces in your home regularly Clean and disinfect high-touch surfaces (for example, doorknobs, tables, handles, light switches, and countertops) in your "sick room" and bathroom. In shared spaces, you should clean and disinfect surfaces and items after each use by the person who is ill. If you are sick and cannot clean, a caregiver or other person should only clean and disinfect the area around you (such as your bedroom and bathroom) on an as needed basis. Your caregiver/other person should wait as long as possible (at least several hours) and wear a mask before entering, cleaning, and disinfecting shared spaces that you use. Clean and disinfect areas that may have blood, stool, or body fluids on them.  Use household cleaners and disinfectants. Clean visible dirty surfaces with household cleaners containing soap or detergent. Then, use a household  disinfectant. Use a product from [employer] List N: Disinfectants for Coronavirus (NOIBB-04). Be sure to follow the instructions on the label to ensure safe and effective use of the product. Many products recommend keeping the surface wet with a disinfectant for a certain period of time (look at "contact time" on the product label). You may also need to wear personal protective equipment, such as gloves, depending on the directions on the product label. Immediately after disinfecting, wash your hands with soap and water for 20 seconds. For completed guidance on cleaning and disinfecting your home, visit Complete Disinfection Guidance. Take steps to improve ventilation at home Improve ventilation (air flow) at home to help prevent from spreading COVID-19 to other people in your household. Clear out COVID-19 virus particles in the air by opening windows, using air filters, and turning on fans in your home. Use this interactive tool to learn how to improve air flow in your home. When you can be around others after being sick with COVID-19 Deciding when you can be around others is different for different situations. Find out when you can safely end home isolation. For any additional questions about your care, contact your healthcare provider or state or local health department. 10/01/2020 Content source: [facility] ([facility]), Division of Viral Diseases This information is not intended to replace advice given to you by your health care provider. Make sure you discuss any questions you have with your health care provider. Document Revised: 11/14/2020 Document Reviewed: 11/14/2020 Elsevier Patient Education  2022 [employer].      If you have been instructed to have an in-person evaluation today at a local Urgent Care facility, please use the link below. It will take you to a list of all of our available [facility], including address, phone  number and hours of operation. Please do not delay care.  [facility]  If you or a family member do not have a primary care provider, use the link below to schedule a visit and establish care. When you choose a [facility] primary care physician or advanced practice provider, you gain a long-term partner in health. Find a Primary Care Provider  Learn more about [facility]'s in-office and virtual care options: [place] [facility]

## 2022-07-09 NOTE — Progress Notes (Signed)
Virtual Visit Consent   Kathy Clark, you are scheduled for a virtual visit with a [facility] provider today. Just as with appointments in the office, your consent must be obtained to participate. Your consent will be active for this visit and any virtual visit you may have with one of our providers in the next 365 days. If you have a MyChart account, a copy of this consent can be sent to you electronically.  As this is a virtual visit, video technology does not allow for your provider to perform a traditional examination. This may limit your provider's ability to fully assess your condition. If your provider identifies any concerns that need to be evaluated in person or the need to arrange testing (such as labs, EKG, etc.), we will make arrangements to do so. Although advances in technology are sophisticated, we cannot ensure that it will always work on either your end or our end. If the connection with a video visit is poor, the visit may have to be switched to a telephone visit. With either a video or telephone visit, we are not always able to ensure that we have a secure connection.  By engaging in this virtual visit, you consent to the provision of healthcare and authorize for your insurance to be billed (if applicable) for the services provided during this visit. Depending on your insurance coverage, you may receive a charge related to this service.  I need to obtain your verbal consent now. Are you willing to proceed with your visit today? Kathy Clark has provided verbal consent on 07/09/2022 for a virtual visit (video or telephone). Kathy Mayo, NP  Date: 07/09/2022 11:23 AM  Virtual Visit via Video Note   I, Kathy Clark, connected with  Kathy Clark  ([id number], March 05, 1961) on 07/09/22 at 11:45 AM EST by a video-enabled telemedicine application and verified that I am speaking with the correct person using two identifiers.  Location: Patient: Virtual Visit Location Patient:  Home Provider: Virtual Visit Location Provider: Home Office   I discussed the limitations of evaluation and management by telemedicine and the availability of in person appointments. The patient expressed understanding and agreed to proceed.    History of Present Illness: Kathy Clark is a 61 y.o. who identifies as a female who was assigned female at birth, and is being seen today for COVID + this morning. Symptoms started Wednesday- congestion, hoarseness, cough-dry Fever 100.5- max, body aches, headaches, sore throat, and L ear pain.   Denies chest pain, shortness of breath.  COVID vaccines and boosters Flu vaccine  Known covid + exposure over the weekend.   Problems:  Patient Active Problem List   Diagnosis Date Noted   Inattention 01/09/2022   Other fatigue 08/29/2021   Indication present for endocarditis prophylaxis 05/07/2020   PVC (premature ventricular contraction) 05/07/2020   Major depressive disorder with single episode, in partial remission ([place]) 05/07/2020   Bursitis of left shoulder 02/08/2020   Encounter for weight management 01/10/2020   Sleeping difficulties 01/10/2020   Rheumatoid arthritis of multiple sites with negative rheumatoid factor ([place]) 12/04/2019   Polyarthralgia 11/02/2019   Myofascial pain 11/02/2019   Arthralgia of both hands 10/03/2019   Metatarsalgia of left foot 01/05/2018   Other hyperlipidemia 03/23/2017   OSA (obstructive sleep apnea) 02/25/2017   Class 2 obesity due to excess calories with body mass index (BMI) of 38.0 to 38.9 in adult 08/17/2016   Vitamin D deficiency 08/03/2016   Anxious depression  02/20/2016   Left carpal tunnel syndrome, post right carpal tunnel release 12/24/2014   Trochanteric bursitis of right hip 04/21/2013   Postop Mild Hyponatremia 07/30/2011   OSTEOARTHRITIS, KNEE, LEFT 12/12/2009   Migraine 05/31/2008   SKIN CANCER, HX OF 01/06/2008   COLITIS, HX OF 01/06/2008   ALLERGIC RHINITIS 05/20/2007    HYPERTENSION, BENIGN ESSENTIAL 11/23/2006   SCIATICA, CHRONIC 05/19/2006    Allergies:  Allergies  Allergen Reactions   Succinylcholine Anaphylaxis   Dilaudid [Hydromorphone Hcl] Nausea And Vomiting   Nsaids Other (See Comments)    Trigger colits   Plaquenil [Hydroxychloroquine] Other (See Comments)    Chronic tinnitus   Medications:  Current Outpatient Medications:    AMBULATORY NON FORMULARY MEDICATION, Medication Name: Please decrease CPAP setting to 8 cm of water pressure and fax [place] a download after 5 days.  Is having a lot of difficulty with air leaks is working to try to reduce her pressure to see if she is still being adequately treated but with fewer leaks.FAx to [place], Disp: 1 Units, Rfl: 0   Calcium Carbonate-Vitamin D (CALTRATE 600+D PO), Take 1 tablet by mouth daily., Disp: , Rfl:    doxycycline (VIBRA-TABS) 100 MG tablet, Take 1 tablet (100 mg total) by mouth 2 (two) times daily., Disp: 20 tablet, Rfl: 0   EPINEPHrine 0.3 mg/0.3 mL IJ SOAJ injection, Inject 0.3 mg into the muscle as needed for anaphylaxis., Disp: 2 each, Rfl: PRN   ergocalciferol (VITAMIN D2) 1.25 MG (50000 UT) capsule, Take by mouth., Disp: , Rfl:    estradiol (VIVELLE-DOT) 0.075 MG/24HR, Apply 1 patch to skin twice weekly as directed, Disp: 24 patch, Rfl: 3   fluconazole (DIFLUCAN) 150 MG tablet, Take 1 tablet (150 mg total) by mouth every 3 (three) days as needed., Disp: 2 tablet, Rfl: 0   fluticasone (FLONASE) 50 MCG/ACT nasal spray, Place 1 to 2 sprays into both nostrils daily., Disp: 48 g, Rfl: 3   gabapentin (NEURONTIN) 300 MG capsule, TAKE 1 CAPSULE BY MOUTH EVERY MORNING AND 2 CAPSULES EVERY NIGHT AT BEDTIME, Disp: 270 capsule, Rfl: 1   hydrochlorothiazide (HYDRODIURIL) 25 MG tablet, Take 1 tablet by mouth daily., Disp: 90 tablet, Rfl: 3   lisdexamfetamine (VYVANSE) 40 MG capsule, Take 1 capsule (40 mg total) by mouth daily., Disp: 30 capsule, Rfl: 0   losartan (COZAAR) 25 MG tablet, TAKE  1 TABLET BY MOUTH ONCE A DAY., Disp: 90 tablet, Rfl: 3   Melatonin 3 MG TABS, Take by mouth as directed., Disp: , Rfl:    methocarbamol (ROBAXIN) 500 MG tablet, Take one tablet (500 mg dose) by mouth 3 (three) times a day as needed., Disp: 90 tablet, Rfl: 2   Multiple Vitamins-Minerals (MULTIVITAMIN ADULT PO), Take by mouth daily., Disp: , Rfl:    ondansetron (ZOFRAN) 4 MG tablet, Take 1 tablet (4 mg total) by mouth every 8 (eight) hours as needed for nausea or vomiting., Disp: 15 tablet, Rfl: 0   Semaglutide-Weight Management (WEGOVY) 2.4 MG/0.75ML SOAJ, Inject 2.4 mg into the skin every 7 (seven) days., Disp: 3 mL, Rfl: 3   SUMAtriptan (IMITREX) 20 MG/ACT nasal spray, PLACE 1 SPRAY INTO THE NOSE EVERY 2 HOURS AS NEEDED FOR HEADACHE, Disp: 6 each, Rfl: 0   Upadacitinib ER (RINVOQ) 15 MG TB24, Take 1 tablet by mouth daily., Disp: 30 tablet, Rfl: 2   verapamil (CALAN-SR) 120 MG CR tablet, Take 1 tablet  by mouth at bedtime., Disp: 90 tablet, Rfl: 3  Vilazodone HCl 20 MG TABS, Take 1 tablet (20 mg total) by mouth daily., Disp: 90 tablet, Rfl: 0  Observations/Objective: Patient is well-developed, well-nourished in no acute distress.  Resting comfortably  at home.  Head is normocephalic, atraumatic.  No labored breathing.  Speech is clear and coherent with logical content.  Patient is alert and oriented at baseline.  Hoarseness  Dry cough  Assessment and Plan:   1. COVID-19  - nirmatrelvir/ritonavir (PAXLOVID) 20 x 150 MG & 10 x '100MG'$  TABS; Take 3 tablets by mouth 2 (two) times daily for 5 days. (Take nirmatrelvir 150 mg two tablets twice daily for 5 days and ritonavir 100 mg one tablet twice daily for 5 days) Patient GFR is 74 06/18/2022  Dispense: 30 tablet; Refill: 0 - predniSONE (DELTASONE) 20 MG tablet; Take 2 tablets (40 mg total) by mouth daily with breakfast for 5 days.  Dispense: 10 tablet; Refill: 0 - promethazine-dextromethorphan (PROMETHAZINE-DM) 6.25-15 MG/5ML syrup; Take 5 mLs  by mouth 4 (four) times daily as needed for cough.  Dispense: 118 mL; Refill: 0  - Continue OTC symptomatic management of choice - Will send OTC vitamins and supplement information through AVS - Take as prescribed - Patient enrolled in MyChart symptom monitoring - Push fluids - Rest as needed - Discussed return precautions and when to seek in-person evaluation, sent via AVS as well    Reviewed side effects, risks and benefits of medication.    Patient acknowledged agreement and understanding of the plan.   Past Medical, Surgical, Social History, Allergies, and Medications have been Reviewed.   Follow Up Instructions: I discussed the assessment and treatment plan with the patient. The patient was provided an opportunity to ask questions and all were answered. The patient agreed with the plan and demonstrated an understanding of the instructions.  A copy of instructions were sent to the patient via MyChart unless otherwise noted below.    The patient was advised to call back or seek an in-person evaluation if the symptoms worsen or if the condition fails to improve as anticipated.  Time:  I spent 10 minutes with the patient via telehealth technology discussing the above problems/concerns.    Kathy Mayo, NP

## 2022-07-11 ENCOUNTER — Telehealth: Payer: 59 | Admitting: Physician Assistant

## 2022-07-11 DIAGNOSIS — R1084 Generalized abdominal pain: Secondary | ICD-10-CM

## 2022-07-11 DIAGNOSIS — U071 COVID-19: Secondary | ICD-10-CM

## 2022-07-11 DIAGNOSIS — Z7189 Other specified counseling: Secondary | ICD-10-CM

## 2022-07-11 DIAGNOSIS — K219 Gastro-esophageal reflux disease without esophagitis: Secondary | ICD-10-CM

## 2022-07-11 NOTE — Progress Notes (Signed)
E-Visit for Heartburn  We are sorry that you are not feeling well.  Here is how we plan to help!  Based on what you shared with me it looks like you most likely have Gastroesophageal Reflux Disease (GERD)  Gastroesophageal reflux disease (GERD) happens when acid from your stomach flows up into the esophagus.  When acid comes in contact with the esophagus, the acid causes sorenss (inflammation) in the esophagus.  Over time, GERD may create small holes (ulcers) in the lining of the esophagus.  I recommend using over the counter Pepcid '20mg'$  one by mouth twice a day for two weeks. If you already have a two week supply of Nexium -- you can use this instead.  Your symptoms should improve in the next day or two.  You can use antacids as needed until symptoms resolve.  Call us if your heartburn worsens, you have trouble swallowing, weight loss, spitting up blood or recurrent vomiting.  Home Care: May include lifestyle changes such as weight loss, quitting smoking and alcohol consumption Avoid foods and drinks that make your symptoms worse, such as: Caffeine or alcoholic drinks Chocolate Peppermint or mint flavorings Garlic and onions Spicy foods Citrus fruits, such as oranges, lemons, or limes Tomato-based foods such as sauce, chili, salsa and pizza Fried and fatty foods Avoid lying down for 3 hours prior to your bedtime or prior to taking a nap Eat small, frequent meals instead of a large meals Wear loose-fitting clothing.  Do not wear anything tight around your waist that causes pressure on your stomach. Raise the head of your bed 6 to 8 inches with wood blocks to help you sleep.  Extra pillows will not help.  Seek Help Right Away If: You have pain in your arms, neck, jaw, teeth or back Your pain increases or changes in intensity or duration You develop nausea, vomiting or sweating (diaphoresis) You develop shortness of breath or you faint Your vomit is green, yellow, black or looks like  coffee grounds or blood Your stool is red, bloody or black  These symptoms could be signs of other problems, such as heart disease, gastric bleeding or esophageal bleeding.  Make sure you : Understand these instructions. Will watch your condition. Will get help right away if you are not doing well or get worse.  Your e-visit answers were reviewed by a board certified advanced clinical practitioner to complete your personal care plan.  Depending on the condition, your plan could have included both over the counter or prescription medications.  If there is a problem please reply  once you have received a response from your provider.  Your safety is important to [place].  If you have drug allergies check your prescription carefully.    You can use MyChart to ask questions about today's visit, request a non-urgent call back, or ask for a work or school excuse for 24 hours related to this e-Visit. If it has been greater than 24 hours you will need to follow up with your provider, or enter a new e-Visit to address those concerns.  You will get an e-mail in the next two days asking about your experience.  I hope that your e-visit has been valuable and will speed your recovery. Thank you for using e-visits.  This appointment required 5-10 minutes of patient care (this includes precharting, chart review, review of results, face-to-face care, etc.).  Inda Coke PA-C

## 2022-07-14 ENCOUNTER — Other Ambulatory Visit: Payer: Self-pay

## 2022-07-22 ENCOUNTER — Other Ambulatory Visit: Payer: Self-pay | Admitting: Family Medicine

## 2022-07-23 ENCOUNTER — Other Ambulatory Visit: Payer: Self-pay

## 2022-07-23 ENCOUNTER — Other Ambulatory Visit (HOSPITAL_COMMUNITY): Payer: Self-pay

## 2022-07-23 MED ORDER — VILAZODONE HCL 20 MG PO TABS
1.0000 | ORAL_TABLET | Freq: Every day | ORAL | 0 refills | Status: DC
  Filled 2022-07-23: qty 90, 90d supply, fill #0

## 2022-07-24 ENCOUNTER — Other Ambulatory Visit (HOSPITAL_COMMUNITY): Payer: Self-pay

## 2022-07-24 ENCOUNTER — Other Ambulatory Visit: Payer: Self-pay

## 2022-07-26 DIAGNOSIS — G4733 Obstructive sleep apnea (adult) (pediatric): Secondary | ICD-10-CM | POA: Diagnosis not present

## 2022-07-28 ENCOUNTER — Other Ambulatory Visit: Payer: Self-pay | Admitting: Family Medicine

## 2022-07-28 DIAGNOSIS — R5383 Other fatigue: Secondary | ICD-10-CM

## 2022-07-28 DIAGNOSIS — R41840 Attention and concentration deficit: Secondary | ICD-10-CM

## 2022-07-29 ENCOUNTER — Other Ambulatory Visit: Payer: Self-pay

## 2022-07-29 ENCOUNTER — Other Ambulatory Visit (HOSPITAL_COMMUNITY): Payer: Self-pay

## 2022-07-29 MED ORDER — LISDEXAMFETAMINE DIMESYLATE 40 MG PO CAPS
40.0000 mg | ORAL_CAPSULE | Freq: Every day | ORAL | 0 refills | Status: DC
  Filled 2022-07-29 – 2022-08-04 (×2): qty 90, 90d supply, fill #0

## 2022-07-30 ENCOUNTER — Other Ambulatory Visit: Payer: Self-pay

## 2022-07-31 ENCOUNTER — Other Ambulatory Visit (HOSPITAL_COMMUNITY): Payer: Self-pay

## 2022-08-03 ENCOUNTER — Other Ambulatory Visit (HOSPITAL_COMMUNITY): Payer: Self-pay

## 2022-08-03 ENCOUNTER — Other Ambulatory Visit: Payer: Self-pay

## 2022-08-04 ENCOUNTER — Other Ambulatory Visit (HOSPITAL_COMMUNITY): Payer: Self-pay

## 2022-08-05 DIAGNOSIS — Z1231 Encounter for screening mammogram for malignant neoplasm of breast: Secondary | ICD-10-CM | POA: Diagnosis not present

## 2022-08-05 LAB — HM MAMMOGRAPHY

## 2022-08-07 ENCOUNTER — Other Ambulatory Visit (HOSPITAL_COMMUNITY): Payer: Self-pay

## 2022-08-10 ENCOUNTER — Other Ambulatory Visit: Payer: Self-pay

## 2022-08-10 ENCOUNTER — Encounter: Payer: Self-pay | Admitting: Family Medicine

## 2022-08-10 ENCOUNTER — Other Ambulatory Visit (HOSPITAL_COMMUNITY): Payer: Self-pay

## 2022-08-25 ENCOUNTER — Other Ambulatory Visit (HOSPITAL_COMMUNITY): Payer: Self-pay

## 2022-08-25 ENCOUNTER — Other Ambulatory Visit: Payer: Self-pay

## 2022-08-25 ENCOUNTER — Other Ambulatory Visit: Payer: Self-pay | Admitting: Family Medicine

## 2022-08-25 DIAGNOSIS — I10 Essential (primary) hypertension: Secondary | ICD-10-CM

## 2022-08-25 DIAGNOSIS — G5603 Carpal tunnel syndrome, bilateral upper limbs: Secondary | ICD-10-CM

## 2022-08-25 MED ORDER — HYDROCHLOROTHIAZIDE 25 MG PO TABS
25.0000 mg | ORAL_TABLET | Freq: Every day | ORAL | 3 refills | Status: DC
  Filled 2022-08-25: qty 90, 90d supply, fill #0
  Filled 2022-11-21: qty 90, 90d supply, fill #1

## 2022-08-25 MED ORDER — GABAPENTIN 300 MG PO CAPS
ORAL_CAPSULE | ORAL | 1 refills | Status: DC
  Filled 2022-08-25: qty 270, 90d supply, fill #0
  Filled 2022-11-19: qty 270, 90d supply, fill #1

## 2022-08-25 NOTE — Telephone Encounter (Signed)
Please contact pt to schedule appt with Dr.Metheney. Sending medication refills.

## 2022-08-26 ENCOUNTER — Other Ambulatory Visit: Payer: Self-pay | Admitting: Family Medicine

## 2022-08-26 ENCOUNTER — Telehealth: Payer: Self-pay | Admitting: Pharmacist

## 2022-08-26 ENCOUNTER — Other Ambulatory Visit: Payer: Self-pay

## 2022-08-26 DIAGNOSIS — G4733 Obstructive sleep apnea (adult) (pediatric): Secondary | ICD-10-CM | POA: Diagnosis not present

## 2022-08-26 DIAGNOSIS — Z7689 Persons encountering health services in other specified circumstances: Secondary | ICD-10-CM

## 2022-08-26 MED ORDER — WEGOVY 2.4 MG/0.75ML ~~LOC~~ SOAJ
2.4000 mg | SUBCUTANEOUS | 0 refills | Status: DC
  Filled 2022-08-26: qty 3, 28d supply, fill #0

## 2022-08-26 NOTE — Telephone Encounter (Signed)
Pt scheduled for 09/07/2022 @ 2:20. tvt

## 2022-08-26 NOTE — Telephone Encounter (Signed)
Called patient to schedule an appointment for the [facility]. I was unable to reach the patient so I left a HIPAA-compliant message requesting that the patient return my call.   Luke Van Ausdall, PharmD, BCACP, CPP Clinical Pharmacist [facility] [phone]  

## 2022-08-27 ENCOUNTER — Encounter: Payer: Self-pay | Admitting: Pharmacist

## 2022-08-27 ENCOUNTER — Other Ambulatory Visit (HOSPITAL_COMMUNITY): Payer: Self-pay

## 2022-08-27 ENCOUNTER — Ambulatory Visit: Payer: Commercial Managed Care - PPO | Attending: Family Medicine | Admitting: Pharmacist

## 2022-08-27 DIAGNOSIS — Z79899 Other long term (current) drug therapy: Secondary | ICD-10-CM

## 2022-08-27 NOTE — Progress Notes (Signed)
  S: Patient presents today for review of their specialty medication.   Patient is currently taking Rinvoq for rheumatoid arthritis. Patient is managed by PA Raymondo Band for this.   Dosing: Adult  Note: May be used as monotherapy or in combination with methotrexate or other nonbiologic disease-modifying antirheumatic drugs (DMARDs); use in combination with biologic DMARDS or potent immunosuppressants (eg, azathioprine, cyclosporine) is not recommended. Do not initiate therapy in patients with an absolute lymphocyte count <500/mm3, ANC <1,000/mm3, or hemoglobin <8 g/dL. Rheumatoid arthritis: Oral: 15 mg once daily.  Adherence: confirmed  Efficacy: reported.   Monitoring: S/sx thromboembolism: none  S/sx malignancy: none  S/sx of infection: none   Current adverse effects: none    O:     Lab Results  Component Value Date   WBC 6.4 05/23/2021   HGB 15.1 05/23/2021   HCT 44.1 05/23/2021   MCV 90.6 05/23/2021   PLT 314 05/23/2021      Chemistry      Component Value Date/Time   NA 139 01/09/2022 0000   NA 139 11/08/2019 0806   K 4.1 01/09/2022 0000   CL 99 01/09/2022 0000   CO2 33 (H) 01/09/2022 0000   BUN 13 01/09/2022 0000   BUN 18 11/08/2019 0806   CREATININE 0.78 01/09/2022 0000      Component Value Date/Time   CALCIUM 9.5 01/09/2022 0000   ALKPHOS 66 11/08/2019 0806   AST 40 (H) 05/23/2021 0000   ALT 81 (H) 05/23/2021 0000   BILITOT 0.6 05/23/2021 0000   BILITOT 0.3 11/08/2019 0806       Lab Results  Component Value Date   CHOL 188 01/08/2021   HDL 51 01/08/2021   LDLCALC 106 (H) 01/08/2021   TRIG 192 (H) 01/08/2021   CHOLHDL 3.7 01/08/2021     A/P: 1. Medication review: patient currently taking Rinvoq for rheumatoid arthritis. Reviewed the medication with the patient, including the following: Rinvoq is a medication used to treat rheumatoid arthritis. Administer with or without food. Swallow tablet whole; do not crush, split, or chew. Possible  adverse effects include increased risk of infection, GI upset, hematologic toxicity, hepatic effects, lipid abnormalities, increased risk of malignancy, thromboembolism. Avoid live vaccinations. No recommendations for any changes.  Benard Halsted, PharmD, [facility] [place], [place] [phone]

## 2022-08-28 ENCOUNTER — Other Ambulatory Visit (HOSPITAL_COMMUNITY): Payer: Self-pay

## 2022-08-28 MED ORDER — AMOXICILLIN 500 MG PO CAPS
2000.0000 mg | ORAL_CAPSULE | ORAL | 0 refills | Status: DC
  Filled 2022-08-28 – 2022-08-31 (×3): qty 8, 2d supply, fill #0

## 2022-08-29 ENCOUNTER — Other Ambulatory Visit (HOSPITAL_COMMUNITY): Payer: Self-pay

## 2022-08-31 ENCOUNTER — Other Ambulatory Visit (HOSPITAL_BASED_OUTPATIENT_CLINIC_OR_DEPARTMENT_OTHER): Payer: Self-pay

## 2022-09-04 ENCOUNTER — Other Ambulatory Visit: Payer: Self-pay

## 2022-09-04 ENCOUNTER — Other Ambulatory Visit (HOSPITAL_BASED_OUTPATIENT_CLINIC_OR_DEPARTMENT_OTHER): Payer: Self-pay

## 2022-09-04 ENCOUNTER — Other Ambulatory Visit: Payer: Self-pay | Admitting: Family Medicine

## 2022-09-04 ENCOUNTER — Other Ambulatory Visit (HOSPITAL_COMMUNITY): Payer: Self-pay

## 2022-09-04 DIAGNOSIS — I10 Essential (primary) hypertension: Secondary | ICD-10-CM

## 2022-09-04 MED ORDER — LOSARTAN POTASSIUM 25 MG PO TABS
25.0000 mg | ORAL_TABLET | Freq: Every day | ORAL | 3 refills | Status: DC
  Filled 2022-09-04: qty 90, 90d supply, fill #0
  Filled 2022-11-28: qty 90, 90d supply, fill #1

## 2022-09-07 ENCOUNTER — Other Ambulatory Visit: Payer: Self-pay

## 2022-09-07 ENCOUNTER — Other Ambulatory Visit (HOSPITAL_COMMUNITY): Payer: Self-pay

## 2022-09-07 ENCOUNTER — Ambulatory Visit: Payer: Commercial Managed Care - PPO | Admitting: Family Medicine

## 2022-09-07 ENCOUNTER — Encounter: Payer: Self-pay | Admitting: Family Medicine

## 2022-09-07 VITALS — BP 126/68 | HR 71 | Ht 68.0 in | Wt 222.5 lb

## 2022-09-07 DIAGNOSIS — F458 Other somatoform disorders: Secondary | ICD-10-CM

## 2022-09-07 DIAGNOSIS — R41840 Attention and concentration deficit: Secondary | ICD-10-CM | POA: Diagnosis not present

## 2022-09-07 DIAGNOSIS — I10 Essential (primary) hypertension: Secondary | ICD-10-CM

## 2022-09-07 DIAGNOSIS — Z7689 Persons encountering health services in other specified circumstances: Secondary | ICD-10-CM | POA: Diagnosis not present

## 2022-09-07 DIAGNOSIS — M0609 Rheumatoid arthritis without rheumatoid factor, multiple sites: Secondary | ICD-10-CM | POA: Diagnosis not present

## 2022-09-07 DIAGNOSIS — H43813 Vitreous degeneration, bilateral: Secondary | ICD-10-CM | POA: Diagnosis not present

## 2022-09-07 MED ORDER — LISDEXAMFETAMINE DIMESYLATE 30 MG PO CAPS
30.0000 mg | ORAL_CAPSULE | Freq: Every day | ORAL | 0 refills | Status: DC
  Filled 2022-09-07: qty 14, 14d supply, fill #0

## 2022-09-07 NOTE — Progress Notes (Unsigned)
Established Patient Office Visit  Subjective   Patient ID: Kathy Clark, female    DOB: [birth year]  Age: 62 y.o. MRN: [id number]  Chief Complaint  Patient presents with   Medication Refill    HPI Hypertension- Pt denies chest pain, SOB, dizziness, or heart palpitations.  Taking meds as directed w/o problems.  Denies medication side effects.    Rheumatoid arthritis-currently on room [place] and working with the clinical pharmacist to get that covered.  Follow-up weight management-she is still on 2.4 mg Wegovy she did gain a little bit of weight over the holidays and has been able to get that back off.  She is 217 on her home scale.  She would really like to work on a way to ramp up her exercise.  She was enjoying water aerobics but they discontinued the early class and outs in the middle of the day and that is a real challenge with her work schedule.  Follow-up inattention-we did discuss that she is feeling a little shaky in the mid to late afternoon.  She is not sure if it could be related to a drop in glucose levels or if it could be the Vyvanse wearing off.  She is also noticed she is just a little bit more edgy and irritable at times.  She also recently went to the dentist and was told that she is grinding her teeth.  He has been using doxylamine to help her sleep.  ROS    Objective:     BP 126/68   Pulse 71   Ht '5\' 8"'$  (1.727 m)   Wt 222 lb 8.2 oz (100.9 kg)   LMP  (LMP Unknown) Comment: hystrectomy  SpO2 97%   BMI 33.83 kg/m    Physical Exam Vitals and nursing note reviewed.  Constitutional:      Appearance: She is well-developed.  HENT:     Head: Normocephalic and atraumatic.  Cardiovascular:     Rate and Rhythm: Normal rate and regular rhythm.     Heart sounds: Normal heart sounds.  Pulmonary:     Effort: Pulmonary effort is normal.     Breath sounds: Normal breath sounds.  Skin:    General: Skin is warm and dry.  Neurological:     Mental Status: She is alert  and oriented to person, place, and time.  Psychiatric:        Behavior: Behavior normal.      No results found for any visits on 09/07/22.    The 10-year ASCVD risk score (Arnett DK, et al., 2019) is: 5.2%    Assessment & Plan:   Problem List Items Addressed This Visit       Cardiovascular and Mediastinum   HYPERTENSION, BENIGN ESSENTIAL - Primary    Well controlled. Continue current regimen. Follow up in  6 mo       Relevant Orders   COMPLETE METABOLIC PANEL WITH GFR   Lipid Panel w/reflex Direct LDL     Musculoskeletal and Integument   Rheumatoid arthritis of multiple sites with negative rheumatoid factor (HCC)     Other   Inattention    We discussed doing a 2-week trial of decreasing her Vyvanse dose to 30 mg to see if she notices the edginess is improving and the shaky sensation in the afternoon.  We also discussed maybe eating a protein snack in the afternoon in case it is more related to a glucose issue.  We can always go back to the 40  mg if that is more effective and works better.      Relevant Medications   lisdexamfetamine (VYVANSE) 30 MG capsule   Encounter for weight management    Visit # 10 Starting Weight: 252 lbs.     Current weight: 222 lbs  Previous weight: 219 lbs Change in weight: Up 3 lbs  Goal weight: 185 lb  Dietary goals: Protein with each meal, portion control. Continue MyFitness Pal. Exercise goals: + water aerobics. RA is limitng factor.  considering working with a Physiological scientist to add in some Charity fundraiser.   Medication: Continue Wegovy 2.4 mg.  Follow-up :12 week      Other Visit Diagnoses     Bruxism           Return in about 12 weeks (around 11/30/2022) for weight check,bp,inattention.    Beatrice Lecher, MD

## 2022-09-07 NOTE — Assessment & Plan Note (Signed)
Visit # 10 Starting Weight: 252 lbs.     Current weight: 222 lbs  Previous weight: 219 lbs Change in weight: Up 3 lbs  Goal weight: 185 lb  Dietary goals: Protein with each meal, portion control. Continue MyFitness Pal. Exercise goals: + water aerobics. RA is limitng factor.  considering working with a Physiological scientist to add in some Charity fundraiser.   Medication: Continue Wegovy 2.4 mg.  Follow-up :12 week

## 2022-09-08 NOTE — Assessment & Plan Note (Signed)
We discussed doing a 2-week trial of decreasing her Vyvanse dose to 30 mg to see if she notices the edginess is improving and the shaky sensation in the afternoon.  We also discussed maybe eating a protein snack in the afternoon in case it is more related to a glucose issue.  We can always go back to the 40 mg if that is more effective and works better.

## 2022-09-08 NOTE — Assessment & Plan Note (Signed)
Well controlled. Continue current regimen. Follow up in  6 mo  

## 2022-09-10 ENCOUNTER — Other Ambulatory Visit (HOSPITAL_COMMUNITY): Payer: Self-pay

## 2022-09-10 ENCOUNTER — Other Ambulatory Visit: Payer: Self-pay

## 2022-09-11 ENCOUNTER — Other Ambulatory Visit (HOSPITAL_COMMUNITY): Payer: Self-pay

## 2022-09-11 DIAGNOSIS — I10 Essential (primary) hypertension: Secondary | ICD-10-CM | POA: Diagnosis not present

## 2022-09-12 LAB — COMPLETE METABOLIC PANEL WITH GFR
AG Ratio: 1.7 (calc) (ref 1.0–2.5)
ALT: 19 U/L (ref 6–29)
AST: 16 U/L (ref 10–35)
Albumin: 4.2 g/dL (ref 3.6–5.1)
Alkaline phosphatase (APISO): 28 U/L — ABNORMAL LOW (ref 37–153)
BUN: 10 mg/dL (ref 7–25)
CO2: 34 mmol/L — ABNORMAL HIGH (ref 20–32)
Calcium: 9 mg/dL (ref 8.6–10.4)
Chloride: 101 mmol/L (ref 98–110)
Creat: 0.68 mg/dL (ref 0.50–1.05)
Globulin: 2.5 g/dL (calc) (ref 1.9–3.7)
Glucose, Bld: 77 mg/dL (ref 65–99)
Potassium: 3.9 mmol/L (ref 3.5–5.3)
Sodium: 140 mmol/L (ref 135–146)
Total Bilirubin: 0.5 mg/dL (ref 0.2–1.2)
Total Protein: 6.7 g/dL (ref 6.1–8.1)
eGFR: 98 mL/min/{1.73_m2} (ref >=60)

## 2022-09-12 LAB — LIPID PANEL W/REFLEX DIRECT LDL
Cholesterol: 219 mg/dL — ABNORMAL HIGH (ref <200)
HDL: 63 mg/dL (ref >=50)
LDL Cholesterol (Calc): 135 mg/dL (calc) — ABNORMAL HIGH
Non-HDL Cholesterol (Calc): 156 mg/dL (calc) — ABNORMAL HIGH (ref <130)
Total CHOL/HDL Ratio: 3.5 (calc) (ref <5.0)
Triglycerides: 107 mg/dL (ref <150)

## 2022-09-14 ENCOUNTER — Other Ambulatory Visit (HOSPITAL_COMMUNITY): Payer: Self-pay

## 2022-09-14 ENCOUNTER — Other Ambulatory Visit: Payer: Self-pay

## 2022-09-14 ENCOUNTER — Other Ambulatory Visit: Payer: Self-pay | Admitting: Pharmacist

## 2022-09-14 MED ORDER — RINVOQ 15 MG PO TB24
ORAL_TABLET | ORAL | 1 refills | Status: DC
  Filled 2022-09-14: qty 30, 30d supply, fill #0
  Filled 2022-11-13: qty 30, 30d supply, fill #1

## 2022-09-14 MED ORDER — RINVOQ 15 MG PO TB24: ORAL_TABLET | ORAL | 1 refills | Status: DC

## 2022-09-14 NOTE — Progress Notes (Signed)
Hi Kathy Clark, the AST and ALT are back down to normal which is great.  The alkaline phosphatase is low but that is not concerning it is really only concerning if it is elevated.  Cholesterol and LDL did go up compared to last year.  Continue to work on [employer] and staying active.

## 2022-09-16 ENCOUNTER — Other Ambulatory Visit (HOSPITAL_COMMUNITY): Payer: Self-pay

## 2022-09-24 DIAGNOSIS — G4733 Obstructive sleep apnea (adult) (pediatric): Secondary | ICD-10-CM | POA: Diagnosis not present

## 2022-09-28 ENCOUNTER — Other Ambulatory Visit: Payer: Self-pay

## 2022-09-28 ENCOUNTER — Other Ambulatory Visit (HOSPITAL_COMMUNITY): Payer: Self-pay

## 2022-09-28 ENCOUNTER — Other Ambulatory Visit: Payer: Self-pay | Admitting: Family Medicine

## 2022-09-28 DIAGNOSIS — Z7689 Persons encountering health services in other specified circumstances: Secondary | ICD-10-CM

## 2022-09-28 MED ORDER — WEGOVY 2.4 MG/0.75ML ~~LOC~~ SOAJ
2.4000 mg | SUBCUTANEOUS | 0 refills | Status: DC
  Filled 2022-09-28 (×2): qty 3, 28d supply, fill #0

## 2022-09-28 NOTE — Progress Notes (Signed)
Office Visit Note  Patient: Kathy Clark             Date of Birth: Jan 11, 1961           MRN: [id number]             PCP: Hali Marry, MD Referring: Hali Marry, * Visit Date: 09/29/2022 Occupation: Epic build/RN  Subjective:  New Patient (Initial Visit)   History of Present Illness: Kathy BRICKETT is a 62 y.o. female here to establish care for seronegative RA, on rinvoq needing to transfer care due to change in insurance. Initial diagnosis in 2021 for evaluation of increased joint pain with peripheral synovitis affecting hands.  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  Lumbar degenerative disc disease with previous L5-S1 discectomy.  However she was experiencing increasing symptoms especially with visible swelling involving the MCP joints on both hands.  Also developed increased pain especially in the toes and forefeet on both sides.  Previous treatments include methotrexate, hydroxychloroquine, Enbrel, and prednisone.  Did not tolerate the small molecule medications due to significant side effects.  Enbrel monotherapy was not highly effective though she originally had some response to the combination with methotrexate.  So far was doing well on rinvoq, but she did have to discontinue medicine for a few weeks due to COVID infection and subsequent sinus infection treated with antibiotics.  Noticed increase in symptoms with treatment interruption.  She cannot tolerate even low-dose NSAIDs due to lymphocytic colitis associated diarrhea.  Occasionally takes low-dose prednisone 5 mg for breakthrough symptoms but not on any daily dose.  DMARD Hx Enbrel MTX HCQ  09/11/22 CMP unremarkable  11/2019 HBV neg HCV neg  09/2019 CRP 14.9 ANA neg RF neg CCP neg ESR 22  Activities of Daily  Living:  Patient reports morning stiffness for 30 minutes.   Patient Reports nocturnal pain.  Difficulty dressing/grooming: Denies Difficulty climbing stairs: Denies Difficulty getting out of chair: Denies Difficulty using hands for taps, buttons, cutlery, and/or writing: Reports  Review of Systems  Constitutional:  Positive for fatigue.  HENT:  Negative for mouth sores and mouth dryness.   Eyes:  Positive for dryness.  Respiratory:  Negative for shortness of breath.   Cardiovascular:  Positive for palpitations. Negative for chest pain.  Gastrointestinal:  Negative for blood in stool, constipation and diarrhea.  Endocrine: Negative for increased urination.  Genitourinary:  Negative for involuntary urination.  Musculoskeletal:  Positive for joint pain, gait problem, joint pain, joint swelling, myalgias, muscle weakness, morning stiffness, muscle tenderness and myalgias.  Skin:  Negative for color change, rash, hair loss and sensitivity to sunlight.  Allergic/Immunologic: Negative for susceptible to infections.  Neurological:  Positive for headaches. Negative for dizziness.  Hematological:  Negative for swollen glands.  Psychiatric/Behavioral:  Positive for sleep disturbance. Negative for depressed mood. The patient is nervous/anxious.     PMFS History:  Patient Active Problem List   Diagnosis Date Noted   Seronegative rheumatoid arthritis ([place]) 09/29/2022   High risk medication use 09/29/2022   Inattention 01/09/2022   Other fatigue 08/29/2021   Indication present for endocarditis prophylaxis 05/07/2020   PVC (premature ventricular contraction) 05/07/2020   Major depressive disorder with single episode, in partial remission ([place]) 05/07/2020   Bursitis of left shoulder 02/08/2020   Encounter for weight management 01/10/2020   Sleeping difficulties  01/10/2020   Rheumatoid arthritis of multiple sites with negative rheumatoid factor ([place]) 12/04/2019   Polyarthralgia 11/02/2019    Myofascial pain 11/02/2019   Arthralgia of both hands 10/03/2019   Metatarsalgia of left foot 01/05/2018   Other hyperlipidemia 03/23/2017   OSA (obstructive sleep apnea) 02/25/2017   Class 2 obesity due to excess calories with body mass index (BMI) of 38.0 to 38.9 in adult 08/17/2016   Vitamin D deficiency 08/03/2016   Anxious depression 02/20/2016   Left carpal tunnel syndrome, post right carpal tunnel release 12/24/2014   Trochanteric bursitis of right hip 04/21/2013   Postop Mild Hyponatremia 07/30/2011   OSTEOARTHRITIS, KNEE, LEFT 12/12/2009   Migraine 05/31/2008   SKIN CANCER, HX OF 01/06/2008   COLITIS, HX OF 01/06/2008   ALLERGIC RHINITIS 05/20/2007   HYPERTENSION, BENIGN ESSENTIAL 11/23/2006   SCIATICA, CHRONIC 05/19/2006    Past Medical History:  Diagnosis Date   Anxiety    Back pain    Complication of anesthesia    allergy to Succinylcholine   Depression    Essential hypertension, benign    Gastritis    GERD (gastroesophageal reflux disease)    Joint pain    Lymphocytic colitis    microscopic   Migraines    Osteoarthritis    Sciatica    right leg   Swelling    feet, left foot    Family History  Problem Relation Age of Onset   Tongue cancer Mother    Breast cancer Mother    Anxiety disorder Mother    Depression Mother    Heart disease Father    Hypertension Father    Hyperlipidemia Father    Atrial fibrillation Brother    Hypertension Brother    Stroke Paternal Grandmother    Breast cancer Other    Colon cancer Neg Hx    Past Surgical History:  Procedure Laterality Date   ABDOMINAL HYSTERECTOMY  07/2005   [place] Bunionectomy Left 09/18/2016   Left Foot   BACK SURGERY     KNEE SURGERY  2006   left; multiple   microdiscetomy  2005   SHOULDER SURGERY  07/22/2009   right   SKIN CANCER EXCISION     eyelid   TOTAL ABDOMINAL HYSTERECTOMY     TOTAL KNEE ARTHROPLASTY  07/27/2011   Procedure: TOTAL KNEE ARTHROPLASTY;  Surgeon: Dione Plover Aluisio;   Location: [facility];  Service: Orthopedics;  Laterality: Left;   Social History   Social History Narrative   Not on file   Immunization History  Administered Date(s) Administered   COVID-19, mRNA, vaccine(Comirnaty)12 years and older 04/30/2022   Influenza Whole 04/12/2006   Influenza,inj,Quad PF,6+ Mos 05/29/2013, 03/12/2015, 03/04/2018, 05/03/2019, 05/07/2020, 05/02/2021, 04/30/2022   Influenza-Unspecified 04/13/2017   PFIZER(Purple Top)SARS-COV-2 Vaccination 07/22/2019, 08/10/2019, 06/17/2020   Tdap 03/12/2015     Objective: Vital Signs: BP 123/79 (BP Location: Right Arm, Patient Position: Sitting, Cuff Size: Normal)   Pulse 73   Resp 12   Ht 5\' 8"  (1.727 m)   Wt 226 lb (102.5 kg)   LMP  (LMP Unknown) Comment: hystrectomy  BMI 34.36 kg/m    Physical Exam Constitutional:      Appearance: She is obese.  Eyes:     Conjunctiva/sclera: Conjunctivae normal.  Cardiovascular:     Rate and Rhythm: Normal rate and regular rhythm.  Pulmonary:     Effort: Pulmonary effort is normal.     Breath sounds: Normal breath sounds.  Lymphadenopathy:     Cervical: No cervical  adenopathy.  Skin:    General: Skin is warm and dry.     Findings: No rash.  Neurological:     Mental Status: She is alert.  Psychiatric:        Mood and Affect: Mood normal.      Musculoskeletal Exam:  Shoulders full ROM no swelling some pain with full active ROM Elbows full ROM no tenderness or swelling Wrists full ROM no tenderness or swelling Fingers full ROM no tenderness or swelling Lateral hip tenderness to pressure and pain provoked with FABER maneuver b/l, no radiation, left side extends posteriorly over gluteal muscles Knees full ROM no tenderness or swelling, crepitus present Ankles full ROM no tenderness or swelling, soft tissue swelling overlying ankle no joint effusion no warmth, erythema, or pitting Left 1st MTP s/p bunion repair, left 2nd toe hammertoe deformity no palpable synovitis   CDAI  Exam: CDAI Score: 2  Patient Global: 10 mm; Provider Global: 10 mm Swollen: 0 ; Tender: 0  Joint Exam 09/29/2022   All documented joints were normal     Investigation: No additional findings.  Imaging: No results found.  Recent Labs: Lab Results  Component Value Date   WBC 6.4 05/23/2021   HGB 15.1 05/23/2021   PLT 314 05/23/2021   NA 140 09/11/2022   K 3.9 09/11/2022   CL 101 09/11/2022   CO2 34 (H) 09/11/2022   GLUCOSE 77 09/11/2022   BUN 10 09/11/2022   CREATININE 0.68 09/11/2022   BILITOT 0.5 09/11/2022   ALKPHOS 66 11/08/2019   AST 16 09/11/2022   ALT 19 09/11/2022   PROT 6.7 09/11/2022   ALBUMIN 4.3 11/08/2019   CALCIUM 9.0 09/11/2022   GFRAA 111 01/08/2021    Speciality Comments: No specialty comments available.  Procedures:  No procedures performed Allergies: Succinylcholine, Dilaudid [hydromorphone hcl], Nsaids, and Plaquenil [hydroxychloroquine]   Assessment / Plan:     Visit Diagnoses: Seronegative rheumatoid arthritis ([place])  Rheumatoid arthritis of multiple sites with negative rheumatoid factor ([place]) - Plan: XR Hand 2 View Right, XR Hand 2 View Left, predniSONE (DELTASONE) 5 MG tablet  Artery disease activity appears pretty well-controlled initial plan will be to continue on with current treatment.  Has not been requiring frequent as needed prednisone due to breakthrough symptoms.  No peripheral synovitis on exam today.  X-ray of bilateral hands checked today with no evidence of erosive disease there is also some osteoarthritis.  Plan to continue Rinvoq 15 mg daily and prednisone 5 mg PRN.  Myofascial pain Sleeping difficulties  On gabapentin 300 mg a.m. 600 mg evening not in particular exacerbation today.  Does not have diffuse muscular pain on exam today.  Does have issues with diarrhea previously found lymphocytic colitis but does not describe current widespread systemic symptoms.  Trochanteric bursitis of right hip  Lateral hip pain worse on  the right side discussed typically related to bursitis or gluteal muscle tendinopathy problem.  Discussed stretching range of motion is typical first treatment could consider local steroid injection or physical therapy referral if failing to improve.  High risk medication use  She had recent blood count metabolic panel checked at the first of the month no problems for continuing current medication.  She had negative hepatitis screening from 2021.  Reviewed risks of rinvoq including infections, cytopenias, hepatotoxicity, hyperlipidemia, or major cardiovascular or blood clots.  No history of diverticulitis or blood clots.  Orders: Orders Placed This Encounter  Procedures   XR Hand 2 View Right  XR Hand 2 View Left   Meds ordered this encounter  Medications   predniSONE (DELTASONE) 5 MG tablet    Sig: Take 1 tablet (5 mg total) by mouth daily as needed.    Dispense:  30 tablet    Refill:  0     Follow-Up Instructions: Return in about 2 months (around 11/29/2022) for RA on UPA f/u 2-30mos.   Collier Salina, MD  Note - This record has been created using [employer].  Chart creation errors have been sought, but may not always  have been located. Such creation errors do not reflect on  the standard of medical care.

## 2022-09-29 ENCOUNTER — Ambulatory Visit: Payer: Commercial Managed Care - PPO

## 2022-09-29 ENCOUNTER — Telehealth: Payer: Self-pay | Admitting: Pharmacist

## 2022-09-29 ENCOUNTER — Encounter: Payer: Self-pay | Admitting: Internal Medicine

## 2022-09-29 ENCOUNTER — Ambulatory Visit: Payer: Commercial Managed Care - PPO | Attending: Internal Medicine | Admitting: Internal Medicine

## 2022-09-29 ENCOUNTER — Ambulatory Visit (INDEPENDENT_AMBULATORY_CARE_PROVIDER_SITE_OTHER): Payer: Commercial Managed Care - PPO

## 2022-09-29 ENCOUNTER — Other Ambulatory Visit: Payer: Self-pay

## 2022-09-29 ENCOUNTER — Other Ambulatory Visit (HOSPITAL_COMMUNITY): Payer: Self-pay

## 2022-09-29 VITALS — BP 123/79 | HR 73 | Resp 12 | Ht 68.0 in | Wt 226.0 lb

## 2022-09-29 DIAGNOSIS — M0609 Rheumatoid arthritis without rheumatoid factor, multiple sites: Secondary | ICD-10-CM | POA: Diagnosis not present

## 2022-09-29 DIAGNOSIS — Z79899 Other long term (current) drug therapy: Secondary | ICD-10-CM

## 2022-09-29 DIAGNOSIS — M7061 Trochanteric bursitis, right hip: Secondary | ICD-10-CM | POA: Diagnosis not present

## 2022-09-29 DIAGNOSIS — G479 Sleep disorder, unspecified: Secondary | ICD-10-CM

## 2022-09-29 DIAGNOSIS — M7918 Myalgia, other site: Secondary | ICD-10-CM | POA: Diagnosis not present

## 2022-09-29 DIAGNOSIS — M0600 Rheumatoid arthritis without rheumatoid factor, unspecified site: Secondary | ICD-10-CM | POA: Diagnosis not present

## 2022-09-29 MED ORDER — PREDNISONE 5 MG PO TABS
5.0000 mg | ORAL_TABLET | Freq: Every day | ORAL | 0 refills | Status: DC | PRN
  Filled 2022-09-29: qty 30, 30d supply, fill #0

## 2022-09-29 NOTE — Telephone Encounter (Signed)
Patient currently on Rinvoq for seronegative RA. She is transitioning care to our clinic due to change in insurance. She is [employer] employee so will have to fill w/ [facility] once approved  Pending OV note from today, please run PA for Rinvoq  Knox Saliva, PharmD, MPH, BCPS, CPP Clinical Pharmacist (Rheumatology and Pulmonology)

## 2022-10-05 ENCOUNTER — Encounter: Payer: Self-pay | Admitting: Internal Medicine

## 2022-10-05 NOTE — Telephone Encounter (Signed)
I called patient, findings and impressions read to patient, I explained to patient that Dr. Benjamine Mola will go over x-rays in full detail at f/u appt.

## 2022-10-07 NOTE — Telephone Encounter (Signed)
It is signed now. Thanks.

## 2022-10-07 NOTE — Telephone Encounter (Signed)
Submitted a Prior Authorization request to [facility] for RINVOQ via CoverMyMeds. Will update once we receive a response.  Key: Gwynn Burly, PharmD, MPH, BCPS, CPP Clinical Pharmacist (Rheumatology and Pulmonology)

## 2022-10-09 ENCOUNTER — Other Ambulatory Visit: Payer: Self-pay

## 2022-10-12 ENCOUNTER — Encounter: Payer: Self-pay | Admitting: Family Medicine

## 2022-10-12 ENCOUNTER — Telehealth: Payer: Commercial Managed Care - PPO | Admitting: Family Medicine

## 2022-10-12 ENCOUNTER — Other Ambulatory Visit (HOSPITAL_COMMUNITY): Payer: Self-pay

## 2022-10-12 VITALS — BP 112/72 | Ht 68.0 in | Wt 220.0 lb

## 2022-10-12 DIAGNOSIS — Z7689 Persons encountering health services in other specified circumstances: Secondary | ICD-10-CM | POA: Diagnosis not present

## 2022-10-12 DIAGNOSIS — Z96652 Presence of left artificial knee joint: Secondary | ICD-10-CM | POA: Diagnosis not present

## 2022-10-12 DIAGNOSIS — M25552 Pain in left hip: Secondary | ICD-10-CM | POA: Diagnosis not present

## 2022-10-12 DIAGNOSIS — Z471 Aftercare following joint replacement surgery: Secondary | ICD-10-CM | POA: Diagnosis not present

## 2022-10-12 DIAGNOSIS — R41840 Attention and concentration deficit: Secondary | ICD-10-CM | POA: Diagnosis not present

## 2022-10-12 DIAGNOSIS — M25562 Pain in left knee: Secondary | ICD-10-CM

## 2022-10-12 MED ORDER — METHYLPREDNISOLONE 4 MG PO TBPK
ORAL_TABLET | ORAL | 0 refills | Status: DC
  Filled 2022-10-12: qty 21, 6d supply, fill #0

## 2022-10-12 MED ORDER — NALTREXONE-BUPROPION HCL ER 8-90 MG PO TB12
ORAL_TABLET | ORAL | 3 refills | Status: DC
Start: 2022-10-12 — End: 2023-01-04
  Filled 2022-10-12: qty 120, 30d supply, fill #0
  Filled 2022-11-03: qty 120, 31d supply, fill #0
  Filled 2022-11-19 – 2022-11-21 (×2): qty 120, 30d supply, fill #0

## 2022-10-12 NOTE — Progress Notes (Signed)
Virtual Visit via Video Note  I connected with Kathy Clark on 10/12/22 at 11:30 AM EDT by a video enabled telemedicine application and verified that I am speaking with the correct person using two identifiers.   I discussed the limitations of evaluation and management by telemedicine and the availability of in person appointments. The patient expressed understanding and agreed to proceed.  Patient location: at home Provider location: in office  Subjective:    CC:  No chief complaint on file.   HPI: Follow-up weight management-she is currently on Wegovy but insurance will no longer cover it after the end of the month.  And so she wanted to discuss alternative options. She had well on the medication. Weight is down to 220 lbs.  Struggles to get in more veggies as has to eat on the go all the time.    Also fell recently. Was at a concert and had drinks in her hands and caught foot on chair leg and twisted and fell.  Left hip and knee have been sore since then.  Hx of knee replacement.  Iced it initially and more recently has been doing heat.  It is painful to walk at times and is keeping her awake at night.   Past medical history, Surgical history, Family history not pertinant except as noted below, Social history, Allergies, and medications have been entered into the medical record, reviewed, and corrections made.    Objective:    General: Speaking clearly in complete sentences without any shortness of breath.  Alert and oriented x3.  Normal judgment. No apparent acute distress.    Impression and Recommendations:    Problem List Items Addressed This Visit       Other   Inattention    Dropped back down on the Adderall to 30 mg but felt like the 40 mg was more effective.  Currently does not need any refills but just FYI.      Encounter for weight management - Primary    Discussed options including Qsymia, Contrave, phentermine.  Will start with Contrave since she is  already on a stimulant.  Visit # 11 Starting Weight: 252 lbs.     Current weight:   Previous weight: 222 lbs Change in weight:  Goal weight: 185 lb  Dietary goals: Protein with each meal, portion control. Continue MyFitness Pal. Exercise goals: + water aerobics. RA is limitng factor.  considering working with a Physiological scientist to add in some Charity fundraiser.   Medication: will need to change from  [facility] 2.4 mg.  She has about 2 more weeks worth and then will switch over to Contrave.  Follow the instructions on the bottle for increased taper.  Discussed potential side effects call if any problems or concerns otherwise follow-up in 6 to 8 weeks. Follow-up :6-8 week      Relevant Medications   Naltrexone-buPROPion HCl ER 8-90 MG TB12   Other Visit Diagnoses     Left hip pain       Acute pain of left knee           Left hip and left knee pain after recent fall 2 weeks ago.  Pain has been bothersome enough that it has been keeping her awake at night did encourage her to follow-up with an orthopedist just to make sure there is no injury to the hardware.  No orders of the defined types were placed in this encounter.   Meds ordered this encounter  Medications  Naltrexone-buPROPion HCl ER 8-90 MG TB12    Sig: Start 1 tablet every morning for 7 days, then 1 tablet twice daily for 7 days, then 2 tablets every morning and one in the evening. Then 2 tabs PO BID    Dispense:  120 tablet    Refill:  3     I discussed the assessment and treatment plan with the patient. The patient was provided an opportunity to ask questions and all were answered. The patient agreed with the plan and demonstrated an understanding of the instructions.   The patient was advised to call back or seek an in-person evaluation if the symptoms worsen or if the condition fails to improve as anticipated.   Beatrice Lecher, MD

## 2022-10-12 NOTE — Assessment & Plan Note (Addendum)
Discussed options including Qsymia, Contrave, phentermine.  Will start with Contrave since she is already on a stimulant.  Visit # 11 Starting Weight: 252 lbs.     Current weight:   Previous weight: 222 lbs Change in weight:  Goal weight: 185 lb  Dietary goals: Protein with each meal, portion control. Continue MyFitness Pal. Exercise goals: + water aerobics. RA is limitng factor.  considering working with a Physiological scientist to add in some Charity fundraiser.   Medication: will need to change from  [facility] 2.4 mg.  She has about 2 more weeks worth and then will switch over to Contrave.  Follow the instructions on the bottle for increased taper.  Discussed potential side effects call if any problems or concerns otherwise follow-up in 6 to 8 weeks. Follow-up :6-8 week

## 2022-10-12 NOTE — Assessment & Plan Note (Signed)
Dropped back down on the Adderall to 30 mg but felt like the 40 mg was more effective.  Currently does not need any refills but just FYI.

## 2022-10-13 ENCOUNTER — Other Ambulatory Visit (HOSPITAL_COMMUNITY): Payer: Self-pay

## 2022-10-13 NOTE — Telephone Encounter (Signed)
Received notification from [facility] regarding a prior authorization for [facility]. Authorization has been APPROVED from 02/07/22 to 02/07/23. Approval letter sent to scan center.  Patient must fill through [place]: [phone]   Authorization # [id number]  Appears that pt is already filling through [facility] so nothing further sohuld be needed.  Knox Saliva, PharmD, MPH, BCPS, CPP Clinical Pharmacist (Rheumatology and Pulmonology)

## 2022-10-16 ENCOUNTER — Other Ambulatory Visit: Payer: Self-pay

## 2022-10-17 ENCOUNTER — Other Ambulatory Visit (HOSPITAL_COMMUNITY): Payer: Self-pay

## 2022-10-19 ENCOUNTER — Other Ambulatory Visit: Payer: Self-pay | Admitting: Family Medicine

## 2022-10-19 MED ORDER — VILAZODONE HCL 20 MG PO TABS
1.0000 | ORAL_TABLET | Freq: Every day | ORAL | 0 refills | Status: DC
  Filled 2022-10-19: qty 90, 90d supply, fill #0

## 2022-10-20 ENCOUNTER — Other Ambulatory Visit (HOSPITAL_COMMUNITY): Payer: Self-pay

## 2022-10-20 ENCOUNTER — Other Ambulatory Visit: Payer: Self-pay

## 2022-10-21 ENCOUNTER — Other Ambulatory Visit (HOSPITAL_COMMUNITY): Payer: Self-pay

## 2022-10-22 ENCOUNTER — Other Ambulatory Visit: Payer: Self-pay

## 2022-10-23 ENCOUNTER — Ambulatory Visit (INDEPENDENT_AMBULATORY_CARE_PROVIDER_SITE_OTHER): Payer: Commercial Managed Care - PPO

## 2022-10-23 ENCOUNTER — Encounter: Payer: Self-pay | Admitting: Podiatry

## 2022-10-23 ENCOUNTER — Encounter: Payer: Self-pay | Admitting: Internal Medicine

## 2022-10-23 ENCOUNTER — Ambulatory Visit: Payer: Commercial Managed Care - PPO | Admitting: Podiatry

## 2022-10-23 DIAGNOSIS — M21622 Bunionette of left foot: Secondary | ICD-10-CM

## 2022-10-23 DIAGNOSIS — M2042 Other hammer toe(s) (acquired), left foot: Secondary | ICD-10-CM | POA: Diagnosis not present

## 2022-10-23 DIAGNOSIS — M79672 Pain in left foot: Secondary | ICD-10-CM | POA: Diagnosis not present

## 2022-10-23 DIAGNOSIS — S99922S Unspecified injury of left foot, sequela: Secondary | ICD-10-CM | POA: Diagnosis not present

## 2022-10-23 NOTE — Progress Notes (Signed)
Subjective:  Patient ID: Kathy Clark, female    DOB: Jul 04, 1961,   MRN: [id number]  Chief Complaint  Patient presents with   Hammer Toe    Left foot hammer toe.     62 y.o. female presents for concern of left second hammertoe that she has been dealing with since Covid. Relates she has had bunion surgery in the past and since then has had issues with her second toe and sounds to have even had an injury to the plantar plate. Relates the hammertoe has worsened and painful at tip and when rubbing in shoes. Has tried toe caps and other conservative options. Relates she also has a bunion on the fifth toe and that can be painful in certain shoes and wondering if this can be fixed as well.  History of RA. She plays keyboard and sings in band and this will be biggest barrier to recovery.  . Denies any other pedal complaints. Denies n/v/f/c.   Past Medical History:  Diagnosis Date   Anxiety    Back pain    Complication of anesthesia    allergy to Succinylcholine   Depression    Essential hypertension, benign    Gastritis    GERD (gastroesophageal reflux disease)    Joint pain    Lymphocytic colitis    microscopic   Migraines    Osteoarthritis    Sciatica    right leg   Swelling    feet, left foot    Objective:  Physical Exam: Vascular: DP/PT pulses 2/4 bilateral. CFT <3 seconds. Normal hair growth on digits. No edema.  Skin. No lacerations or abrasions bilateral feet.  Musculoskeletal: MMT 5/5 bilateral lower extremities in DF, PF, Inversion and Eversion. Deceased ROM in DF of ankle joint.  Hammered second digit rigid at PIPJ. Tender to plantar second metatarsal head and MPJ. Minimal pain with ROM. Lateral eminence tender to fifth metatrsal on left moderate tailors bunio noted.  Neurological: Sensation intact to light touch.   Assessment:   1. Hammer toe of left foot   2. Injury of plantar plate of left foot, sequela   3. Tailor's bunionette, left      Plan:  Patient was  evaluated and treated and all questions answered. -X-rays reviewed.. hammered second digit at PIPJ. Some increased excursion of the second metatarsal and latearl bowing of fifth metatarsal with prominent lateral eminence. Retained hardware from previous bunion surgery.  -Educated on hammertoes and treatment options  -Discussed padding including toe caps and crest pads.  -Powersteps dispensed to support arch and help in time being prior to surgery and after.  -Discussed she has exhausted conservative treatment and is ready to discuss surgery.  Discussed hammertoe repair as well as weil osteotomy and osteotomy of fifth metatarsal for tailors bunion. Discussed perioperative course in detail.  She has RA and on medication. She will call to see how long she will need to be off of this prior to surgery and afterward.  Discussed being off of her feet for 4 weeks after surgery and pin in for possible 6 weeks.  -Informed surgical risk consent was reviewed and read aloud to the patient.  I reviewed the films.  I have discussed my findings with the patient in great detail.  I have discussed all risks including but not limited to infection, stiffness, scarring, limp, disability, deformity, damage to blood vessels and nerves, numbness, poor healing, need for braces, arthritis, chronic pain, amputation, death.  All benefits and realistic expectations discussed in  great detail.  I have made no promises as to the outcome.  I have provided realistic expectations.  I have offered the patient a 2nd opinion, which they have declined and assured me they preferred to proceed despite the risks. Plan for surgery at end of April or May depending on schedule.  Hold RA medication.  Will plan for zofran, oxycodone 5 mg, keflex and ASA BID post operatively.  Return following procedure for post-op appointment.    Louann Sjogren, DPM

## 2022-10-26 ENCOUNTER — Telehealth: Payer: Self-pay | Admitting: Urology

## 2022-10-26 ENCOUNTER — Other Ambulatory Visit: Payer: Self-pay

## 2022-10-26 NOTE — Telephone Encounter (Signed)
DOS - 11/03/22  METATARSAL OSTEOTOMY 2ND & 5TH LEFT --- 73220 HAMMERTOE REPAIR 2ND LEFT --- 25427  [employer] EFFECTIVE DATE - 07/13/22  DEDUCTIBLE - $500.00 W/ $216.32  REMAINING OOP- $7,900.00 W/ $0,623.76 REMAINING COINSURANCE - 20%    SPOKE WITH MARSH WITH [employer] AND HE STATED THAT FOR CPT CODES 28315 AND 28285 NO PRIOR AUTH IS REQUIRED.  CALL REF # [id number]

## 2022-10-27 ENCOUNTER — Other Ambulatory Visit (HOSPITAL_COMMUNITY): Payer: Self-pay

## 2022-10-27 ENCOUNTER — Encounter: Payer: Self-pay | Admitting: Family Medicine

## 2022-10-27 ENCOUNTER — Other Ambulatory Visit: Payer: Self-pay | Admitting: Family Medicine

## 2022-10-27 DIAGNOSIS — G4733 Obstructive sleep apnea (adult) (pediatric): Secondary | ICD-10-CM | POA: Diagnosis not present

## 2022-11-03 ENCOUNTER — Other Ambulatory Visit: Payer: Self-pay

## 2022-11-03 ENCOUNTER — Other Ambulatory Visit: Payer: Self-pay | Admitting: Family Medicine

## 2022-11-03 ENCOUNTER — Other Ambulatory Visit: Payer: Self-pay | Admitting: Podiatry

## 2022-11-03 ENCOUNTER — Other Ambulatory Visit (HOSPITAL_COMMUNITY): Payer: Self-pay

## 2022-11-03 ENCOUNTER — Encounter: Payer: Self-pay | Admitting: Family Medicine

## 2022-11-03 DIAGNOSIS — R5383 Other fatigue: Secondary | ICD-10-CM

## 2022-11-03 DIAGNOSIS — R41840 Attention and concentration deficit: Secondary | ICD-10-CM

## 2022-11-03 DIAGNOSIS — M21622 Bunionette of left foot: Secondary | ICD-10-CM | POA: Diagnosis not present

## 2022-11-03 DIAGNOSIS — M21542 Acquired clubfoot, left foot: Secondary | ICD-10-CM | POA: Diagnosis not present

## 2022-11-03 DIAGNOSIS — M2042 Other hammer toe(s) (acquired), left foot: Secondary | ICD-10-CM | POA: Diagnosis not present

## 2022-11-03 HISTORY — PX: BUNIONECTOMY: SHX129

## 2022-11-03 HISTORY — PX: HAMMER TOE SURGERY: SHX385

## 2022-11-03 MED ORDER — ASPIRIN 81 MG PO TBEC: 81.0000 mg | DELAYED_RELEASE_TABLET | Freq: Two times a day (BID) | ORAL | 12 refills | Status: DC

## 2022-11-03 MED ORDER — CEPHALEXIN 500 MG PO CAPS
500.0000 mg | ORAL_CAPSULE | Freq: Four times a day (QID) | ORAL | 0 refills | Status: AC
  Filled 2022-11-03 (×2): qty 20, 5d supply, fill #0

## 2022-11-03 MED ORDER — ONDANSETRON HCL 4 MG PO TABS
4.0000 mg | ORAL_TABLET | Freq: Three times a day (TID) | ORAL | 0 refills | Status: DC | PRN
  Filled 2022-11-03 (×2): qty 20, 7d supply, fill #0

## 2022-11-03 MED ORDER — OXYCODONE-ACETAMINOPHEN 5-325 MG PO TABS
1.0000 | ORAL_TABLET | Freq: Four times a day (QID) | ORAL | 0 refills | Status: AC | PRN
  Filled 2022-11-03 (×2): qty 28, 7d supply, fill #0

## 2022-11-04 ENCOUNTER — Other Ambulatory Visit (HOSPITAL_COMMUNITY): Payer: Self-pay

## 2022-11-04 MED ORDER — LISDEXAMFETAMINE DIMESYLATE 40 MG PO CAPS
40.0000 mg | ORAL_CAPSULE | Freq: Every day | ORAL | 0 refills | Status: DC
Start: 2022-11-04 — End: 2023-01-11
  Filled 2022-11-04: qty 90, 90d supply, fill #0

## 2022-11-05 ENCOUNTER — Other Ambulatory Visit (HOSPITAL_COMMUNITY): Payer: Self-pay

## 2022-11-07 ENCOUNTER — Telehealth: Payer: Self-pay

## 2022-11-07 NOTE — Telephone Encounter (Signed)
Initiated Prior authorization ZOX:[id number] 8-90MG  er tablets Via: Covermymeds Case/Key:[id number]  Status: Pending as of 11/07/22 Reason: Notified Pt via: Mychart

## 2022-11-10 ENCOUNTER — Encounter: Payer: Self-pay | Admitting: Podiatry

## 2022-11-12 ENCOUNTER — Other Ambulatory Visit (HOSPITAL_COMMUNITY): Payer: Self-pay

## 2022-11-12 ENCOUNTER — Encounter: Payer: Self-pay | Admitting: Podiatry

## 2022-11-12 ENCOUNTER — Ambulatory Visit (INDEPENDENT_AMBULATORY_CARE_PROVIDER_SITE_OTHER): Payer: Commercial Managed Care - PPO | Admitting: Podiatry

## 2022-11-12 ENCOUNTER — Ambulatory Visit (INDEPENDENT_AMBULATORY_CARE_PROVIDER_SITE_OTHER): Payer: Commercial Managed Care - PPO

## 2022-11-12 ENCOUNTER — Other Ambulatory Visit: Payer: Self-pay

## 2022-11-12 DIAGNOSIS — M21622 Bunionette of left foot: Secondary | ICD-10-CM

## 2022-11-12 DIAGNOSIS — Z9889 Other specified postprocedural states: Secondary | ICD-10-CM | POA: Diagnosis not present

## 2022-11-12 DIAGNOSIS — M2042 Other hammer toe(s) (acquired), left foot: Secondary | ICD-10-CM

## 2022-11-12 DIAGNOSIS — M79672 Pain in left foot: Secondary | ICD-10-CM | POA: Diagnosis not present

## 2022-11-12 MED ORDER — OXYCODONE-ACETAMINOPHEN 5-325 MG PO TABS
1.0000 | ORAL_TABLET | ORAL | 0 refills | Status: AC | PRN
  Filled 2022-11-12: qty 28, 5d supply, fill #0

## 2022-11-12 NOTE — Progress Notes (Signed)
Subjective:  Patient ID: Kathy Clark, female    DOB: October 11, 1960,  MRN: [id number]  Chief Complaint  Patient presents with   Routine Post Op    Patient came in today for a POV #1 DOS 11/03/2022 LT 2ND DIGIT HAMMERTOE REPAIR, LT 2ND WEIL OSTEOTOMY, LT 5TH METATARSAL OSTEOTOMY, patient denies any N/V/F/C/SOB, patient rates the pain a 5 out of 10, X-Rays were done today     DOS: 11/03/22  Procedure: Left second hammertoe repair and left weil osteotomy. Left tailors bunionectomy.   62 y.o. female returns for POV#1. Relates doing well and pain controlled. Finished antibiotics and still taking aspirin and pain meds at night.   Review of Systems: Negative except as noted in the HPI. Denies N/V/F/Ch.  Past Medical History:  Diagnosis Date   Anxiety    Back pain    Complication of anesthesia    allergy to Succinylcholine   Depression    Essential hypertension, benign    Gastritis    GERD (gastroesophageal reflux disease)    Joint pain    Lymphocytic colitis    microscopic   Migraines    Osteoarthritis    Sciatica    right leg   Swelling    feet, left foot    Current Outpatient Medications:    oxyCODONE-acetaminophen (PERCOCET/ROXICET) 5-325 MG tablet, Take 1 tablet by mouth every 4 (four) hours as needed for up to 5 days for severe pain., Disp: 28 tablet, Rfl: 0   AMBULATORY NON FORMULARY MEDICATION, Medication Name: Please decrease CPAP setting to 8 cm of water pressure and fax [place] a download after 5 days.  Is having a lot of difficulty with air leaks is working to try to reduce her pressure to see if she is still being adequately treated but with fewer leaks.FAx to [facility], Disp: 1 Units, Rfl: 0   aspirin EC 81 MG tablet, Take 1 tablet (81 mg total) by mouth in the morning and at bedtime. Swallow whole., Disp: 30 tablet, Rfl: 12   Calcium Carbonate-Vitamin D (CALTRATE 600+D PO), Take 1 tablet by mouth daily., Disp: , Rfl:    Cyanocobalamin (VITAMIN B12 PO), Take by  mouth., Disp: , Rfl:    doxylamine, Sleep, (UNISOM) 25 MG tablet, , Disp: , Rfl:    EPINEPHrine 0.3 mg/0.3 mL IJ SOAJ injection, Inject 0.3 mg into the muscle as needed for anaphylaxis., Disp: 2 each, Rfl: PRN   estradiol (VIVELLE-DOT) 0.075 MG/24HR, Apply 1 patch to skin twice weekly as directed, Disp: 24 patch, Rfl: 3   fluticasone (FLONASE) 50 MCG/ACT nasal spray, Place 1 to 2 sprays into both nostrils daily., Disp: 48 g, Rfl: 3   gabapentin (NEURONTIN) 300 MG capsule, Take 1 capsule (300 mg total) by mouth every morning AND 2 capsules (600 mg total) at bedtime., Disp: 270 capsule, Rfl: 1   hydrochlorothiazide (HYDRODIURIL) 25 MG tablet, Take 1 tablet (25 mg total) by mouth daily., Disp: 90 tablet, Rfl: 3   lisdexamfetamine (VYVANSE) 40 MG capsule, Take 1 capsule (40 mg total) by mouth daily., Disp: 90 capsule, Rfl: 0   losartan (COZAAR) 25 MG tablet, Take 1 tablet (25 mg total) by mouth daily., Disp: 90 tablet, Rfl: 3   Melatonin 3 MG TABS, Take by mouth as directed., Disp: , Rfl:    methocarbamol (ROBAXIN) 500 MG tablet, Take one tablet (500 mg dose) by mouth 3 (three) times a day as needed., Disp: 90 tablet, Rfl: 2   Multiple Vitamins-Minerals (MULTIVITAMIN ADULT PO),  Take by mouth daily., Disp: , Rfl:    Naltrexone-buPROPion HCl ER 8-90 MG TB12, Start 1 tablet every morning for 7 days, then 1 tablet twice daily for 7 days, then 2 tablets every morning and one in the evening. Then 2 tabs by mouth 2 times daily, Disp: 120 tablet, Rfl: 3   ondansetron (ZOFRAN) 4 MG tablet, Take 1 tablet (4 mg total) by mouth every 8 (eight) hours as needed for nausea or vomiting., Disp: 15 tablet, Rfl: 0   ondansetron (ZOFRAN) 4 MG tablet, Take 1 tablet (4 mg total) by mouth every 8 (eight) hours as needed for nausea or vomiting., Disp: 20 tablet, Rfl: 0   SUMAtriptan (IMITREX) 20 MG/ACT nasal spray, PLACE 1 SPRAY INTO THE NOSE EVERY 2 HOURS AS NEEDED FOR HEADACHE, Disp: 6 each, Rfl: 0   Upadacitinib ER (RINVOQ)  15 MG TB24, Take one tablet (15 mg dose) by mouth daily., Disp: 30 tablet, Rfl: 1   verapamil (CALAN-SR) 120 MG CR tablet, Take 1 tablet  by mouth at bedtime., Disp: 90 tablet, Rfl: 3   Vilazodone HCl 20 MG TABS, Take 1 tablet (20 mg total) by mouth daily., Disp: 90 tablet, Rfl: 0   VITAMIN D PO, Take by mouth., Disp: , Rfl:   Social History   Tobacco Use  Smoking Status Never   Passive exposure: Past  Smokeless Tobacco Never    Allergies  Allergen Reactions   Succinylcholine Anaphylaxis   Dilaudid [Hydromorphone Hcl] Nausea And Vomiting   Nsaids Other (See Comments)    Trigger colits   Plaquenil [Hydroxychloroquine] Other (See Comments)    Chronic tinnitus   Objective:  There were no vitals filed for this visit. There is no height or weight on file to calculate BMI. Constitutional Well developed. Well nourished.  Vascular Foot warm and well perfused. Capillary refill normal to all digits.   Neurologic Normal speech. Oriented to person, place, and time. Epicritic sensation to light touch grossly present bilaterally.  Dermatologic Skin healing well without signs of infection. Skin edges well coapted without signs of infection.  Orthopedic: Tenderness to palpation noted about the surgical site.   Radiographs: Toes well aligned and hardware intact Assessment:   1. Post-operative state   2. Hammer toe of left foot   3. Tailor's bunionette, left    Plan:  Patient was evaluated and treated and all questions answered.  S/p foot surgery left -Progressing as expected post-operatively. -WB Status: NWB in CAM boot  -Sutures: intact. -Medications: Refill of pain medication.  -Foot redressed.  Return in 2 weeks for suture removal.   Return in about 2 weeks (around 11/26/2022) for post op.

## 2022-11-13 ENCOUNTER — Encounter: Payer: Self-pay | Admitting: Internal Medicine

## 2022-11-13 ENCOUNTER — Other Ambulatory Visit (HOSPITAL_COMMUNITY): Payer: Self-pay

## 2022-11-16 ENCOUNTER — Other Ambulatory Visit: Payer: Self-pay

## 2022-11-19 ENCOUNTER — Encounter (HOSPITAL_COMMUNITY): Payer: Self-pay

## 2022-11-19 ENCOUNTER — Telehealth (INDEPENDENT_AMBULATORY_CARE_PROVIDER_SITE_OTHER): Payer: Commercial Managed Care - PPO | Admitting: Family Medicine

## 2022-11-19 ENCOUNTER — Encounter: Payer: Self-pay | Admitting: Family Medicine

## 2022-11-19 ENCOUNTER — Other Ambulatory Visit (HOSPITAL_COMMUNITY): Payer: Self-pay

## 2022-11-19 ENCOUNTER — Other Ambulatory Visit: Payer: Self-pay | Admitting: Family Medicine

## 2022-11-19 VITALS — BP 117/85 | HR 84 | Wt 220.0 lb

## 2022-11-19 DIAGNOSIS — Z7689 Persons encountering health services in other specified circumstances: Secondary | ICD-10-CM

## 2022-11-19 DIAGNOSIS — M0609 Rheumatoid arthritis without rheumatoid factor, multiple sites: Secondary | ICD-10-CM | POA: Diagnosis not present

## 2022-11-19 MED ORDER — PHENTERMINE HCL 15 MG PO CAPS
15.0000 mg | ORAL_CAPSULE | ORAL | 0 refills | Status: DC
  Filled 2022-11-19: qty 30, 30d supply, fill #0

## 2022-11-19 MED ORDER — FLUTICASONE PROPIONATE 50 MCG/ACT NA SUSP
1.0000 | Freq: Every day | NASAL | 3 refills | Status: AC
  Filled 2022-11-19: qty 48, 90d supply, fill #0

## 2022-11-19 NOTE — Progress Notes (Signed)
Virtual Visit via Video Note  I connected with [name] on 11/19/22 at  3:20 PM EDT by a video enabled telemedicine application and verified that I am speaking with the correct person using two identifiers.   I discussed the limitations of evaluation and management by telemedicine and the availability of in person appointments. The patient expressed understanding and agreed to proceed.  Patient location: at home Provider location: in office  Subjective:    CC:  No chief complaint on file.   HPI:  Following up for virtual visit.  We decided to go ahead and start a different weight loss medication but unfortunately it has not received prior authorization yet.  We have been short staffed here.  Will check to see where were out on that.  She also just found out that her job is being outsourced and so she will no longer be working for [employer] and will be working for another company out of [place] so that is can to change her benefits etc.  She did have her foot surgery and is actually been doing well postoperatively.  She is has to be nonweightbearing for a month.  She also wanted to let me know that she had an episode of tachycardia where her heart rate went up to about 200 bpm about a week after surgery she was able to lay down and it eased off.  And she has not had it happen again since then she did have a little bit of caffeine that day but not more than normal.  She does take Vyvanse.  He also wanted to ask about vitamin D supplementation she currently takes 1000 IU daily and at 1 point in time was actually on prescription vitamin D.  Past medical history, Surgical history, Family history not pertinant except as noted below, Social history, Allergies, and medications have been entered into the medical record, reviewed, and corrections made.    Objective:    General: Speaking clearly in complete sentences without any shortness of breath.  Alert and oriented x3.  Normal  judgment. No apparent acute distress.    Impression and Recommendations:    Problem List Items Addressed This Visit       Musculoskeletal and Integument   Rheumatoid arthritis of multiple sites with negative rheumatoid factor (HCC)    She is getting established with a new rheumatologist and is hopeful that they will be in network for her with her new health insurance.        Other   Encounter for weight management - Primary    Visit # 12 Starting Weight: 252 lbs.     Current weight:  220 lbs  Previous weight: 222 lbs Change in weight: down 2 lbs  Goal weight: 185 lb  Dietary goals: Protein with each meal, portion control. Exercise goals: RA is limitng factor.  Unable to be physically active because of foot surgery so right now she is just trying to maintain. Medication: We discussed interim options of our trying to get the Contrave approved with insurance.  Will do a low-dose of phentermine.  Will need to monitor blood pressure and pulse very carefully especially since she also takes Vyvanse.  If any side effects such as palpitations or chest pain etc. Follow-up :6-8 week       No orders of the defined types were placed in this encounter.   Meds ordered this encounter  Medications   phentermine 15 MG capsule    Sig: Take  1 capsule (15 mg total) by mouth every morning.    Dispense:  30 capsule    Refill:  0   Once her insurance switches over then we will likely have to start over with the medications.  And we could always go back to see if a GLP-1 would be covered for her.  Continue with vitamin D 25 mcg daily.  We can always check levels at upcoming office visit or if her rheumatologist would be willing to check it at upcoming visit that would be great.  If we need to get her back on prescription vitamin D we can certainly do so.  I discussed the assessment and treatment plan with the patient. The patient was provided an opportunity to ask questions and all were  answered. The patient agreed with the plan and demonstrated an understanding of the instructions.   The patient was advised to call back or seek an in-person evaluation if the symptoms worsen or if the condition fails to improve as anticipated.  I spent 22 minutes on the day of the encounter to include pre-visit record review, face-to-face time with the patient and post visit ordering of test.   Nani Gasser, MD

## 2022-11-19 NOTE — Progress Notes (Signed)
Pt stated that she is being out scoured and her insurance will possibly be changing.   Pt has Vit D deficiency and she is currently taking Vit D otc she has f/u with Rheumatology on 5/20 and they will do labs. She wanted to speak with Dr. Linford Arnold about this.

## 2022-11-19 NOTE — Assessment & Plan Note (Signed)
She is getting established with a new rheumatologist and is hopeful that they will be in network for her with her new health insurance.

## 2022-11-19 NOTE — Assessment & Plan Note (Signed)
Visit # 12 Starting Weight: 252 lbs.     Current weight:  220 lbs  Previous weight: 222 lbs Change in weight: down 2 lbs  Goal weight: 185 lb  Dietary goals: Protein with each meal, portion control. Exercise goals: RA is limitng factor.  Unable to be physically active because of foot surgery so right now she is just trying to maintain. Medication: We discussed interim options of our trying to get the Contrave approved with insurance.  Will do a low-dose of phentermine.  Will need to monitor blood pressure and pulse very carefully especially since she also takes Vyvanse.  If any side effects such as palpitations or chest pain etc. Follow-up :6-8 week

## 2022-11-20 ENCOUNTER — Ambulatory Visit: Payer: Commercial Managed Care - PPO | Admitting: Family Medicine

## 2022-11-20 ENCOUNTER — Other Ambulatory Visit: Payer: Self-pay

## 2022-11-20 ENCOUNTER — Other Ambulatory Visit (HOSPITAL_COMMUNITY): Payer: Self-pay

## 2022-11-21 ENCOUNTER — Other Ambulatory Visit: Payer: Self-pay | Admitting: Family Medicine

## 2022-11-21 DIAGNOSIS — I493 Ventricular premature depolarization: Secondary | ICD-10-CM

## 2022-11-23 ENCOUNTER — Other Ambulatory Visit: Payer: Self-pay

## 2022-11-25 ENCOUNTER — Other Ambulatory Visit: Payer: Self-pay

## 2022-11-25 MED ORDER — VERAPAMIL HCL ER 120 MG PO TBCR
120.0000 mg | EXTENDED_RELEASE_TABLET | Freq: Every day | ORAL | 3 refills | Status: DC
Start: 2022-11-25 — End: 2023-01-11
  Filled 2022-11-25: qty 90, 90d supply, fill #0

## 2022-11-25 NOTE — Telephone Encounter (Signed)
I called patient, appt 12/15/2022.

## 2022-11-25 NOTE — Telephone Encounter (Signed)
I think rescheduling the appointment makes the most sense. She was off rinvoq for the surgery so would be best to see her back when on the medication regularly again. Also had labs checked in PCP office in March with okay metabolic panel.  We can reschedule to the last week of May or first part of June is fine.

## 2022-11-26 ENCOUNTER — Ambulatory Visit (INDEPENDENT_AMBULATORY_CARE_PROVIDER_SITE_OTHER): Payer: Commercial Managed Care - PPO

## 2022-11-26 ENCOUNTER — Ambulatory Visit (INDEPENDENT_AMBULATORY_CARE_PROVIDER_SITE_OTHER): Payer: Commercial Managed Care - PPO | Admitting: Podiatry

## 2022-11-26 ENCOUNTER — Encounter: Payer: Self-pay | Admitting: Podiatry

## 2022-11-26 DIAGNOSIS — M7989 Other specified soft tissue disorders: Secondary | ICD-10-CM | POA: Diagnosis not present

## 2022-11-26 DIAGNOSIS — Z9889 Other specified postprocedural states: Secondary | ICD-10-CM

## 2022-11-26 DIAGNOSIS — G4733 Obstructive sleep apnea (adult) (pediatric): Secondary | ICD-10-CM | POA: Diagnosis not present

## 2022-11-26 NOTE — Progress Notes (Signed)
Subjective:  Patient ID: Kathy Clark, female    DOB: 09-20-1960,  MRN: [id number]  Chief Complaint  Patient presents with   Routine Post Op    POV #2 DOS 11/03/2022 LT 2ND DIGIT HAMMERTOE REPAIR, LT 2ND WEIL OSTEOTOMY, LT 5TH METATARSAL OSTEOTOMY, patient denies any pain, NO N/V/F/C/SOB, patient did have a fall over the weekend,  Sutures removed today     DOS: 11/03/22  Procedure: Left second hammertoe repair and left weil osteotomy. Left tailors bunionectomy.   62 y.o. female returns for POV#2. Relates doing well and pain controlled. Did have a small fall over the weekend and landed on the foot. Denies much pain.   Review of Systems: Negative except as noted in the HPI. Denies N/V/F/Ch.  Past Medical History:  Diagnosis Date   Anxiety    Back pain    Complication of anesthesia    allergy to Succinylcholine   Depression    Essential hypertension, benign    Gastritis    GERD (gastroesophageal reflux disease)    Joint pain    Lymphocytic colitis    microscopic   Migraines    Osteoarthritis    Sciatica    right leg   Swelling    feet, left foot    Current Outpatient Medications:    AMBULATORY NON FORMULARY MEDICATION, Medication Name: Please decrease CPAP setting to 8 cm of water pressure and fax [place] a download after 5 days.  Is having a lot of difficulty with air leaks is working to try to reduce her pressure to see if she is still being adequately treated but with fewer leaks.FAx to [facility], Disp: 1 Units, Rfl: 0   aspirin EC 81 MG tablet, Take 1 tablet (81 mg total) by mouth in the morning and at bedtime. Swallow whole., Disp: 30 tablet, Rfl: 12   Calcium Carbonate-Vitamin D (CALTRATE 600+D PO), Take 1 tablet by mouth daily., Disp: , Rfl:    Cyanocobalamin (VITAMIN B12 PO), Take by mouth., Disp: , Rfl:    doxylamine, Sleep, (UNISOM) 25 MG tablet, , Disp: , Rfl:    EPINEPHrine 0.3 mg/0.3 mL IJ SOAJ injection, Inject 0.3 mg into the muscle as needed for  anaphylaxis., Disp: 2 each, Rfl: PRN   estradiol (VIVELLE-DOT) 0.075 MG/24HR, Apply 1 patch to skin twice weekly as directed, Disp: 24 patch, Rfl: 3   fluticasone (FLONASE) 50 MCG/ACT nasal spray, Place 1 to 2 sprays into both nostrils daily., Disp: 48 g, Rfl: 3   gabapentin (NEURONTIN) 300 MG capsule, Take 1 capsule (300 mg total) by mouth every morning AND 2 capsules (600 mg total) at bedtime., Disp: 270 capsule, Rfl: 1   hydrochlorothiazide (HYDRODIURIL) 25 MG tablet, Take 1 tablet (25 mg total) by mouth daily., Disp: 90 tablet, Rfl: 3   lisdexamfetamine (VYVANSE) 40 MG capsule, Take 1 capsule (40 mg total) by mouth daily., Disp: 90 capsule, Rfl: 0   losartan (COZAAR) 25 MG tablet, Take 1 tablet (25 mg total) by mouth daily., Disp: 90 tablet, Rfl: 3   Melatonin 3 MG TABS, Take by mouth as directed., Disp: , Rfl:    methocarbamol (ROBAXIN) 500 MG tablet, Take one tablet (500 mg dose) by mouth 3 (three) times a day as needed., Disp: 90 tablet, Rfl: 2   Multiple Vitamins-Minerals (MULTIVITAMIN ADULT PO), Take by mouth daily., Disp: , Rfl:    Naltrexone-buPROPion HCl ER 8-90 MG TB12, Start 1 tablet every morning for 7 days, then 1 tablet twice daily for 7  days, then 2 tablets every morning and one in the evening. Then 2 tabs by mouth 2 times daily (Patient not taking: Reported on 11/19/2022), Disp: 120 tablet, Rfl: 3   ondansetron (ZOFRAN) 4 MG tablet, Take 1 tablet (4 mg total) by mouth every 8 (eight) hours as needed for nausea or vomiting., Disp: 20 tablet, Rfl: 0   phentermine 15 MG capsule, Take 1 capsule (15 mg total) by mouth every morning., Disp: 30 capsule, Rfl: 0   SUMAtriptan (IMITREX) 20 MG/ACT nasal spray, PLACE 1 SPRAY INTO THE NOSE EVERY 2 HOURS AS NEEDED FOR HEADACHE, Disp: 6 each, Rfl: 0   Upadacitinib ER (RINVOQ) 15 MG TB24, Take one tablet (15 mg dose) by mouth daily., Disp: 30 tablet, Rfl: 1   verapamil (CALAN-SR) 120 MG CR tablet, Take 1 tablet (120 mg total) by mouth at bedtime.,  Disp: 90 tablet, Rfl: 3   Vilazodone HCl 20 MG TABS, Take 1 tablet (20 mg total) by mouth daily., Disp: 90 tablet, Rfl: 0   VITAMIN D PO, Take by mouth., Disp: , Rfl:   Social History   Tobacco Use  Smoking Status Never   Passive exposure: Past  Smokeless Tobacco Never    Allergies  Allergen Reactions   Succinylcholine Anaphylaxis   Dilaudid [Hydromorphone Hcl] Nausea And Vomiting   Nsaids Other (See Comments)    Trigger colits   Plaquenil [Hydroxychloroquine] Other (See Comments)    Chronic tinnitus   Objective:  There were no vitals filed for this visit. There is no height or weight on file to calculate BMI. Constitutional Well developed. Well nourished.  Vascular Foot warm and well perfused. Capillary refill normal to all digits.   Neurologic Normal speech. Oriented to person, place, and time. Epicritic sensation to light touch grossly present bilaterally.  Dermatologic Skin healing well without signs of infection. Skin edges well coapted without signs of infection.  Orthopedic: Tenderness to palpation noted about the surgical site.   Radiographs: Toes well aligned and hardware intact  Assessment:   1. Status post left foot surgery    Plan:  Patient was evaluated and treated and all questions answered.  S/p foot surgery left -Progressing as expected post-operatively. -WB Status: NWB in CAM boot for one more week. May begin WBAT in CAM boot for two weeks until follow-up.  -Sutures: removed without incident -May now start getting the foot wet for showers but avoid any soaking for time being.   -Medications: N/a  -Foot redressed.  Return in 3 weeks for pin removal.   Return in about 3 weeks (around 12/17/2022) for post op.

## 2022-11-28 ENCOUNTER — Other Ambulatory Visit (HOSPITAL_COMMUNITY): Payer: Self-pay

## 2022-11-30 ENCOUNTER — Other Ambulatory Visit: Payer: Self-pay | Admitting: Family Medicine

## 2022-11-30 ENCOUNTER — Other Ambulatory Visit (HOSPITAL_COMMUNITY): Payer: Self-pay

## 2022-11-30 ENCOUNTER — Ambulatory Visit: Payer: Commercial Managed Care - PPO | Admitting: Internal Medicine

## 2022-11-30 ENCOUNTER — Other Ambulatory Visit: Payer: Self-pay

## 2022-11-30 MED ORDER — SUMATRIPTAN 20 MG/ACT NA SOLN
NASAL | 5 refills | Status: DC
  Filled 2022-11-30: qty 6, 30d supply, fill #0

## 2022-12-02 ENCOUNTER — Encounter: Payer: Self-pay | Admitting: Podiatry

## 2022-12-08 ENCOUNTER — Other Ambulatory Visit: Payer: Self-pay

## 2022-12-09 ENCOUNTER — Other Ambulatory Visit: Payer: Self-pay | Admitting: Internal Medicine

## 2022-12-09 ENCOUNTER — Other Ambulatory Visit (HOSPITAL_COMMUNITY): Payer: Self-pay

## 2022-12-09 NOTE — Telephone Encounter (Signed)
Last Fill: 09/14/2022  Labs: 09/11/2022 CO2 34, Alkaline phosphatase 28,   TB Gold: I cannot find this lab   Next Visit: 12/11/2022  Last Visit: 09/29/2022  DX: Seronegative rheumatoid arthritis    Current Dose per office note 09/29/2022: Rinvoq 15 mg daily   Okay to refill Rinvoq?

## 2022-12-10 ENCOUNTER — Other Ambulatory Visit: Payer: Self-pay

## 2022-12-10 ENCOUNTER — Other Ambulatory Visit (HOSPITAL_COMMUNITY): Payer: Self-pay

## 2022-12-10 MED ORDER — RINVOQ 15 MG PO TB24
ORAL_TABLET | ORAL | 0 refills | Status: DC
  Filled 2022-12-10: qty 30, 30d supply, fill #0
  Filled 2023-01-07: qty 30, 30d supply, fill #1
  Filled 2023-02-19: qty 30, 30d supply, fill #2

## 2022-12-10 NOTE — Telephone Encounter (Signed)
Patient called to check status of rinvoq refill. Patient needs this sent in asap due to insurance change that will be effective 12/12/2022 and this claims needs to be processed prior to that. Thanks!   Patient would like a phone call when rx has been sent.

## 2022-12-10 NOTE — Telephone Encounter (Signed)
Advised patient that refill has been sent to [facility]. Patient verbalized understanding.

## 2022-12-11 ENCOUNTER — Other Ambulatory Visit (HOSPITAL_COMMUNITY): Payer: Self-pay

## 2022-12-11 ENCOUNTER — Ambulatory Visit: Payer: Commercial Managed Care - PPO | Attending: Internal Medicine | Admitting: Internal Medicine

## 2022-12-11 ENCOUNTER — Telehealth: Payer: Self-pay | Admitting: Pharmacist

## 2022-12-11 ENCOUNTER — Encounter: Payer: Self-pay | Admitting: Internal Medicine

## 2022-12-11 VITALS — BP 116/82 | HR 71 | Resp 14 | Ht 68.0 in | Wt 230.0 lb

## 2022-12-11 DIAGNOSIS — Z79899 Other long term (current) drug therapy: Secondary | ICD-10-CM | POA: Diagnosis not present

## 2022-12-11 DIAGNOSIS — M0609 Rheumatoid arthritis without rheumatoid factor, multiple sites: Secondary | ICD-10-CM

## 2022-12-11 DIAGNOSIS — M7061 Trochanteric bursitis, right hip: Secondary | ICD-10-CM | POA: Diagnosis not present

## 2022-12-11 DIAGNOSIS — M0600 Rheumatoid arthritis without rheumatoid factor, unspecified site: Secondary | ICD-10-CM

## 2022-12-11 LAB — CBC WITH DIFFERENTIAL/PLATELET
Absolute Monocytes: 460 cells/uL (ref 200–950)
Basophils Relative: 0.6 %
Eosinophils Relative: 1.6 %
MCHC: 34 g/dL (ref 32.0–36.0)
MCV: 91.9 fL (ref 80.0–100.0)
Neutrophils Relative %: 66.9 %
Platelets: 288 10*3/uL (ref 140–400)

## 2022-12-11 NOTE — Telephone Encounter (Addendum)
Please run Rinvoq PA through insurance once active  ----- Message from Fuller Plan, MD sent at 12/11/2022 11:57 AM EDT ----- Lorain Childes- Ms. Oveson anticipates insurance change coming up at the end of this week.  She just got a new prescription filled for 30-day supply of Rinvoq but anticipates needing new prior authorization I recommend her to contact us with the updated policy information when she has this.

## 2022-12-11 NOTE — Progress Notes (Signed)
Office Visit Note  Patient: Kathy Clark             Date of Birth: [birth year]           MRN: [id number]             PCP: Agapito Games, MD Referring: Agapito Games, * Visit Date: 12/11/2022   Subjective:  Follow-up   History of Present Illness: Kathy Clark is a 62 y.o. 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  Since her last visit she had left foot surgery as planned which went uneventfully.  She did not experience any flareup of symptoms with perioperative medication interruption.  Has been noticing ongoing pain in the right hip that is doing somewhat worse while she is nonweightbearing with a rigid boot.  She will complete 1 month nonweightbearing in another week.  She took prophylactic antibiotics for surgery but no other infection or antibiotics treatment required.  Previous HPI 09/29/22 Kathy Clark is a 62 y.o. female here to establish care for seronegative RA, on rinvoq needing to transfer care due to change in insurance. Initial diagnosis in 2021 for evaluation of increased joint pain with peripheral synovitis affecting hands.  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  Lumbar degenerative disc disease with previous L5-S1 discectomy.  However she was experiencing increasing symptoms especially with visible swelling involving the MCP joints on both hands.  Also developed increased pain especially in the toes and forefeet on both sides.  Previous treatments include methotrexate, hydroxychloroquine, Enbrel, and prednisone.  Did not tolerate the small molecule medications due to significant side effects.  Enbrel monotherapy was not highly effective though she originally had some response to the combination with methotrexate.  So far was doing well on  rinvoq, but she did have to discontinue medicine for a few weeks due to COVID infection and subsequent sinus infection treated with antibiotics.  Noticed increase in symptoms with treatment interruption.  She cannot tolerate even low-dose NSAIDs due to lymphocytic colitis associated diarrhea.  Occasionally takes low-dose prednisone 5 mg for breakthrough symptoms but not on any daily dose.   DMARD Hx Enbrel MTX HCQ   09/11/22 CMP unremarkable   11/2019 HBV neg HCV neg   09/2019 CRP 14.9 ANA neg RF neg CCP neg ESR 22   Review of Systems  Constitutional:  Positive for fatigue.  HENT:  Negative for mouth sores and mouth dryness.   Eyes:  Positive for dryness.  Respiratory:  Negative for shortness of breath.   Cardiovascular:  Positive for chest pain and palpitations.  Gastrointestinal:  Negative for blood in stool, constipation and diarrhea.  Endocrine: Negative for increased urination.  Genitourinary:  Negative for involuntary urination.  Musculoskeletal:  Positive for joint pain, joint pain, joint swelling, myalgias, morning stiffness, muscle tenderness and myalgias. Negative for gait problem and muscle weakness.  Skin:  Negative for color change, rash, hair loss and sensitivity to sunlight.  Allergic/Immunologic: Negative for susceptible to infections.  Neurological:  Negative for dizziness and headaches.  Hematological:  Negative for swollen glands.  Psychiatric/Behavioral:  Positive for sleep disturbance. Negative for depressed mood. The patient is nervous/anxious.     PMFS History:  Patient Active Problem List   Diagnosis Date Noted   Seronegative rheumatoid arthritis (HCC) 09/29/2022   High risk  medication use 09/29/2022   Inattention 01/09/2022   Other fatigue 08/29/2021   Indication present for endocarditis prophylaxis 05/07/2020   PVC (premature ventricular contraction) 05/07/2020   Major depressive disorder with single episode, in partial remission (HCC) 05/07/2020    Bursitis of left shoulder 02/08/2020   Encounter for weight management 01/10/2020   Sleeping difficulties 01/10/2020   Rheumatoid arthritis of multiple sites with negative rheumatoid factor (HCC) 12/04/2019   Polyarthralgia 11/02/2019   Myofascial pain 11/02/2019   Arthralgia of both hands 10/03/2019   Metatarsalgia of left foot 01/05/2018   Other hyperlipidemia 03/23/2017   OSA (obstructive sleep apnea) 02/25/2017   Class 2 obesity due to excess calories with body mass index (BMI) of 38.0 to 38.9 in adult 08/17/2016   Vitamin D deficiency 08/03/2016   Anxious depression 02/20/2016   Left carpal tunnel syndrome, post right carpal tunnel release 12/24/2014   Trochanteric bursitis of right hip 04/21/2013   Postop Mild Hyponatremia 07/30/2011   OSTEOARTHRITIS, KNEE, LEFT 12/12/2009   Migraine 05/31/2008   SKIN CANCER, HX OF 01/06/2008   COLITIS, HX OF 01/06/2008   ALLERGIC RHINITIS 05/20/2007   HYPERTENSION, BENIGN ESSENTIAL 11/23/2006   SCIATICA, CHRONIC 05/19/2006    Past Medical History:  Diagnosis Date   Anxiety    Back pain    Complication of anesthesia    allergy to Succinylcholine   Depression    Essential hypertension, benign    Gastritis    GERD (gastroesophageal reflux disease)    Joint pain    Lymphocytic colitis    microscopic   Migraines    Osteoarthritis    Sciatica    right leg   Swelling    feet, left foot    Family History  Problem Relation Age of Onset   Tongue cancer Mother    Breast cancer Mother    Anxiety disorder Mother    Depression Mother    Heart disease Father    Hypertension Father    Hyperlipidemia Father    Atrial fibrillation Brother    Hypertension Brother    Stroke Paternal Grandmother    Breast cancer Other    Colon cancer Neg Hx    Past Surgical History:  Procedure Laterality Date   ABDOMINAL HYSTERECTOMY  07/2005   [place] Bunionectomy Left 09/18/2016   Left Foot   BACK SURGERY     BUNIONECTOMY Left 11/03/2022    HAMMER TOE SURGERY Left 11/03/2022   KNEE SURGERY  2006   left; multiple   microdiscetomy  2005   SHOULDER SURGERY  07/22/2009   right   SKIN CANCER EXCISION     eyelid   TOTAL ABDOMINAL HYSTERECTOMY     TOTAL KNEE ARTHROPLASTY  07/27/2011   Procedure: TOTAL KNEE ARTHROPLASTY;  Surgeon: Gus Rankin Aluisio;  Location: [facility];  Service: Orthopedics;  Laterality: Left;   Social History   Social History Narrative   Not on file   Immunization History  Administered Date(s) Administered   COVID-19, mRNA, vaccine(Comirnaty)12 years and older 04/30/2022   Influenza Whole 04/12/2006   Influenza,inj,Quad PF,6+ Mos 05/29/2013, 03/12/2015, 03/04/2018, 05/03/2019, 05/07/2020, 05/02/2021, 04/30/2022   Influenza-Unspecified 04/13/2017   PFIZER(Purple Top)SARS-COV-2 Vaccination 07/22/2019, 08/10/2019, 06/17/2020   Tdap 03/12/2015     Objective: Vital Signs: BP 116/82 (BP Location: Left Arm, Patient Position: Sitting, Cuff Size: Normal)   Pulse 71   Resp 14   Ht 5\' 8"  (1.727 m)   Wt 230 lb (104.3 kg)   LMP  (LMP Unknown) Comment: hystrectomy  BMI 34.97 kg/m    Physical Exam Constitutional:      Appearance: She is obese.  Eyes:     Conjunctiva/sclera: Conjunctivae normal.  Cardiovascular:     Rate and Rhythm: Normal rate and regular rhythm.  Pulmonary:     Effort: Pulmonary effort is normal.     Breath sounds: Normal breath sounds.  Musculoskeletal:     Right lower leg: No edema.     Left lower leg: No edema.  Lymphadenopathy:     Cervical: No cervical adenopathy.  Skin:    General: Skin is warm and dry.     Findings: No rash.  Neurological:     Mental Status: She is alert.  Psychiatric:        Mood and Affect: Mood normal.      Musculoskeletal Exam:  Neck full ROM no tenderness Shoulders full ROM no tenderness or swelling Elbows full ROM no tenderness or swelling Wrists full ROM no tenderness or swelling Fingers full ROM no tenderness or swelling Lateral tenderness to  pressure at the right hip with minimal radiation down the leg, no pain provoked with internal rotation Knees full ROM no tenderness or swelling Left foot in a rigid boot   CDAI Exam: CDAI Score: 2  Patient Global: 10 mm; Provider Global: 10 mm Swollen: 0 ; Tender: 0  Joint Exam 12/11/2022   All documented joints were normal     Investigation: No additional findings.  Imaging: DG Foot Complete Left  Result Date: 11/30/2022 CLINICAL DATA:  History of surgery. Tip scooter over and foot foot down. Pain. EXAM: LEFT FOOT - COMPLETE 3+ VIEW COMPARISON:  Left foot radiographs 11/12/2022 and 10/23/2022 FINDINGS: Remote postsurgical changes of resection of the distal medial aspect of the great toe metatarsal (bunionectomy) with two distal first metatarsal screws likely from prior remote osteotomy. Mild lateral greater than medial great toe metatarsophalangeal joint space narrowing with small subchondral degenerative cyst at the base of the proximal phalanx. Redemonstration of K-wire traversing the PIP and metatarsophalangeal joints of the second toe. There are screws again seen within the second and fifth metatarsal necks status post osteotomy. Persistent distal fifth metatarsal osteotomy lucencies. Mild soft tissue swelling lateral to the fifth metatarsal head is similar to prior. Small plantar calcaneal heel spur. Minimal chronic mineralization of the posterior aspect of the distal Achilles tendon. Minimal dorsal midfoot degenerative osteophytes. No acute fracture or dislocation. IMPRESSION: 1. Remote postsurgical changes of great toe bunionectomy and metatarsal osteotomy. 2. Redemonstration of more recent second and fifth metatarsal neck osteotomies. Persistent distal fifth metatarsal osteotomy lucencies. 3. Mild great toe metatarsophalangeal joint osteoarthritis. Electronically Signed   By: Neita Garnet M.D.   On: 11/30/2022 09:56   DG Foot Complete Left  Result Date: 11/16/2022 CLINICAL DATA:   pain EXAM: LEFT FOOT - COMPLETE 3+ VIEW COMPARISON:  10/23/2022 FINDINGS: K-wire placement across the PIP joint second toe with normal alignment. Interval distal fifth metatarsal osteotomy with surgical screw. Interval second metatarsal head osteotomy with surgical screw. Chronic changes of first metatarsal osteotomy with stable orthopedic hardware. No unexpected fracture or dislocation. Regional soft tissues unremarkable. Small calcaneal spurs. IMPRESSION: Postop changes as above, without apparent complication or other acute finding. Electronically Signed   By: Corlis Leak M.D.   On: 11/16/2022 11:41    Recent Labs: Lab Results  Component Value Date   WBC 6.4 05/23/2021   HGB 15.1 05/23/2021   PLT 314 05/23/2021   NA 140 09/11/2022   K 3.9  09/11/2022   CL 101 09/11/2022   CO2 34 (H) 09/11/2022   GLUCOSE 77 09/11/2022   BUN 10 09/11/2022   CREATININE 0.68 09/11/2022   BILITOT 0.5 09/11/2022   ALKPHOS 66 11/08/2019   AST 16 09/11/2022   ALT 19 09/11/2022   PROT 6.7 09/11/2022   ALBUMIN 4.3 11/08/2019   CALCIUM 9.0 09/11/2022   GFRAA 111 01/08/2021    Speciality Comments: No specialty comments available.  Procedures:  No procedures performed Allergies: Succinylcholine, Dilaudid [hydromorphone hcl], Nsaids, and Plaquenil [hydroxychloroquine]   Assessment / Plan:     Visit Diagnoses: Rheumatoid arthritis of multiple sites with negative rheumatoid factor (HCC) - Plan: C-reactive protein  Inflammation appears pretty well-controlled by history and no peripheral joint synovitis on exam today.  Review of x-rays from last visit showing cystic changes in the carpal bones I suspect related to chronic inflammatory arthritis but no erosive disease and no changes in her MCP joints.  Will recheck CRP for disease activity monitoring.  Plan to continue Rinvoq 15 mg p.o. daily.  Has prednisone 5 mg as needed has not required any recent steroid for symptom exacerbation.  High risk medication use -  Plan: CBC with Differential/Platelet, COMPLETE METABOLIC PANEL WITH GFR  Checking CBC and CMP for medication monitoring on continued Rinvoq treatment.  She had previous negative QuantiFERON test checked for health at work this was drawn through [place] in November 2023.  No new interval infection or medication side effect.  Trochanteric bursitis of right hip  Hip bursitis has not improved since her last visit but she also thinks it is worse compensating for nonweightbearing on the left foot.  Discussed she could try taking the 5 mg prednisone daily for a few days after she gets her boot off to see if this speeds up recovery.  If failing to make additional progress could benefit from trying steroid injection for trochanteric bursitis.  Orders: Orders Placed This Encounter  Procedures   C-reactive protein   CBC with Differential/Platelet   COMPLETE METABOLIC PANEL WITH GFR   No orders of the defined types were placed in this encounter.    Follow-Up Instructions: No follow-ups on file.   Fuller Plan, MD  Note - This record has been created using [employer].  Chart creation errors have been sought, but may not always  have been located. Such creation errors do not reflect on  the standard of medical care.

## 2022-12-12 LAB — COMPLETE METABOLIC PANEL WITH GFR
AG Ratio: 1.8 (calc) (ref 1.0–2.5)
ALT: 23 U/L (ref 6–29)
AST: 18 U/L (ref 10–35)
Albumin: 4.3 g/dL (ref 3.6–5.1)
Alkaline phosphatase (APISO): 41 U/L (ref 37–153)
BUN: 20 mg/dL (ref 7–25)
CO2: 31 mmol/L (ref 20–32)
Calcium: 9.4 mg/dL (ref 8.6–10.4)
Chloride: 100 mmol/L (ref 98–110)
Creat: 0.69 mg/dL (ref 0.50–1.05)
Globulin: 2.4 g/dL (calc) (ref 1.9–3.7)
Glucose, Bld: 91 mg/dL (ref 65–99)
Potassium: 4.5 mmol/L (ref 3.5–5.3)
Sodium: 139 mmol/L (ref 135–146)
Total Bilirubin: 0.4 mg/dL (ref 0.2–1.2)
Total Protein: 6.7 g/dL (ref 6.1–8.1)
eGFR: 98 mL/min/{1.73_m2} (ref >=60)

## 2022-12-12 LAB — CBC WITH DIFFERENTIAL/PLATELET
Basophils Absolute: 38 cells/uL (ref 0–200)
Eosinophils Absolute: 101 cells/uL (ref 15–500)
HCT: 40.9 % (ref 35.0–45.0)
Hemoglobin: 13.9 g/dL (ref 11.7–15.5)
Lymphs Abs: 1487 cells/uL (ref 850–3900)
MCH: 31.2 pg (ref 27.0–33.0)
MPV: 10.9 fL (ref 7.5–12.5)
Monocytes Relative: 7.3 %
Neutro Abs: 4215 cells/uL (ref 1500–7800)
RBC: 4.45 10*6/uL (ref 3.80–5.10)
RDW: 12.3 % (ref 11.0–15.0)
Total Lymphocyte: 23.6 %
WBC: 6.3 10*3/uL (ref 3.8–10.8)

## 2022-12-12 LAB — C-REACTIVE PROTEIN: CRP: <3 mg/L (ref <8.0)

## 2022-12-12 NOTE — Progress Notes (Signed)
CRP is normal. Blood count and metabolic panel are normal.

## 2022-12-15 ENCOUNTER — Ambulatory Visit: Payer: Commercial Managed Care - PPO | Admitting: Internal Medicine

## 2022-12-15 ENCOUNTER — Other Ambulatory Visit (HOSPITAL_COMMUNITY): Payer: Self-pay

## 2022-12-15 NOTE — Telephone Encounter (Signed)
Submitted a Prior Authorization request to [employer] for [facility] via CoverMyMeds. Will update once we receive a response.  Key: [id number]   List of all formulary medications under pt's new insurance plan:

## 2022-12-16 ENCOUNTER — Other Ambulatory Visit (HOSPITAL_COMMUNITY): Payer: Self-pay

## 2022-12-16 NOTE — Telephone Encounter (Signed)
Received notification from [employer] regarding a prior authorization for [facility]. Authorization has been APPROVED from 11/15/2022 to 12/15/2023.  At this time, we have not yet received an approval letter.   Attempted to run test claim yesterday, was unable to receive response due to server error. My reattempt for today resulted in a RTS rejection that is dated yesterday, the test claim apparently successfully adjudicated on the insurance side but was unable to be reversed on our end due to the connection issue. Contacted the insurance pharmacy help desk and was able to get yesterdays claim reversed and successfully run a new test claim today.  Per test claim, copay for 30 days supply is $300.00. Pt has a copay card already on file.  Patient can continue to fill through [facility]: [phone]   Authorization # [id number]   Pt recently had her prescription filled through her former [employer] insurance and it was shipped to her house 7 days ago. Nothing further required at this time.

## 2022-12-17 ENCOUNTER — Ambulatory Visit (INDEPENDENT_AMBULATORY_CARE_PROVIDER_SITE_OTHER): Payer: Commercial Managed Care - PPO

## 2022-12-17 ENCOUNTER — Ambulatory Visit (INDEPENDENT_AMBULATORY_CARE_PROVIDER_SITE_OTHER): Payer: Managed Care, Other (non HMO) | Admitting: Podiatry

## 2022-12-17 ENCOUNTER — Encounter: Payer: Self-pay | Admitting: Podiatry

## 2022-12-17 DIAGNOSIS — Z9889 Other specified postprocedural states: Secondary | ICD-10-CM

## 2022-12-17 NOTE — Progress Notes (Signed)
Subjective:  Patient ID: Kathy Clark, female    DOB: [birth year],  MRN: [id number]  Chief Complaint  Patient presents with   Routine Post Op     POV #3 DOS 11/03/2022 LT 2ND DIGIT HAMMERTOE REPAIR, LT 2ND WEIL OSTEOTOMY, LT 5TH METATARSAL OSTEOTOMY      DOS: 11/03/22  Procedure: Left second hammertoe repair and left weil osteotomy. Left tailors bunionectomy.   62 y.o. female returns for POV#3. Relates doing well and pain controlled. Denies much pain.   Review of Systems: Negative except as noted in the HPI. Denies N/V/F/Ch.  Past Medical History:  Diagnosis Date   Anxiety    Back pain    Complication of anesthesia    allergy to Succinylcholine   Depression    Essential hypertension, benign    Gastritis    GERD (gastroesophageal reflux disease)    Joint pain    Lymphocytic colitis    microscopic   Migraines    Osteoarthritis    Sciatica    right leg   Swelling    feet, left foot    Current Outpatient Medications:    AMBULATORY NON FORMULARY MEDICATION, Medication Name: Please decrease CPAP setting to 8 cm of water pressure and fax [place] a download after 5 days.  Is having a lot of difficulty with air leaks is working to try to reduce her pressure to see if she is still being adequately treated but with fewer leaks.FAx to [facility], Disp: 1 Units, Rfl: 0   aspirin EC 81 MG tablet, Take 1 tablet (81 mg total) by mouth in the morning and at bedtime. Swallow whole., Disp: 30 tablet, Rfl: 12   Calcium Carbonate-Vitamin D (CALTRATE 600+D PO), Take 1 tablet by mouth daily., Disp: , Rfl:    Cyanocobalamin (VITAMIN B12 PO), Take by mouth., Disp: , Rfl:    doxylamine, Sleep, (UNISOM) 25 MG tablet, , Disp: , Rfl:    EPINEPHrine 0.3 mg/0.3 mL IJ SOAJ injection, Inject 0.3 mg into the muscle as needed for anaphylaxis., Disp: 2 each, Rfl: PRN   estradiol (VIVELLE-DOT) 0.075 MG/24HR, Apply 1 patch to skin twice weekly as directed, Disp: 24 patch, Rfl: 3   fluticasone (FLONASE)  50 MCG/ACT nasal spray, Place 1 to 2 sprays into both nostrils daily., Disp: 48 g, Rfl: 3   gabapentin (NEURONTIN) 300 MG capsule, Take 1 capsule (300 mg total) by mouth every morning AND 2 capsules (600 mg total) at bedtime., Disp: 270 capsule, Rfl: 1   hydrochlorothiazide (HYDRODIURIL) 25 MG tablet, Take 1 tablet (25 mg total) by mouth daily., Disp: 90 tablet, Rfl: 3   lisdexamfetamine (VYVANSE) 40 MG capsule, Take 1 capsule (40 mg total) by mouth daily., Disp: 90 capsule, Rfl: 0   losartan (COZAAR) 25 MG tablet, Take 1 tablet (25 mg total) by mouth daily., Disp: 90 tablet, Rfl: 3   Melatonin 3 MG TABS, Take by mouth as directed., Disp: , Rfl:    methocarbamol (ROBAXIN) 500 MG tablet, Take one tablet (500 mg dose) by mouth 3 (three) times a day as needed., Disp: 90 tablet, Rfl: 2   Multiple Vitamins-Minerals (MULTIVITAMIN ADULT PO), Take by mouth daily., Disp: , Rfl:    Naltrexone-buPROPion HCl ER 8-90 MG TB12, Start 1 tablet every morning for 7 days, then 1 tablet twice daily for 7 days, then 2 tablets every morning and one in the evening. Then 2 tabs by mouth 2 times daily (Patient not taking: Reported on 11/19/2022), Disp: 120 tablet, Rfl:  3   ondansetron (ZOFRAN) 4 MG tablet, Take 1 tablet (4 mg total) by mouth every 8 (eight) hours as needed for nausea or vomiting., Disp: 20 tablet, Rfl: 0   SUMAtriptan (IMITREX) 20 MG/ACT nasal spray, PLACE 1 SPRAY INTO THE NOSE EVERY 2 HOURS AS NEEDED FOR HEADACHE, Disp: 6 each, Rfl: 5   Upadacitinib ER (RINVOQ) 15 MG TB24, Take one tablet (15 mg dose) by mouth daily., Disp: 90 tablet, Rfl: 0   verapamil (CALAN-SR) 120 MG CR tablet, Take 1 tablet (120 mg total) by mouth at bedtime., Disp: 90 tablet, Rfl: 3   Vilazodone HCl 20 MG TABS, Take 1 tablet (20 mg total) by mouth daily., Disp: 90 tablet, Rfl: 0   VITAMIN D PO, Take by mouth., Disp: , Rfl:   Social History   Tobacco Use  Smoking Status Never   Passive exposure: Past  Smokeless Tobacco Never     Allergies  Allergen Reactions   Succinylcholine Anaphylaxis   Dilaudid [Hydromorphone Hcl] Nausea And Vomiting   Nsaids Other (See Comments)    Trigger colits   Plaquenil [Hydroxychloroquine] Other (See Comments)    Chronic tinnitus   Objective:  There were no vitals filed for this visit. There is no height or weight on file to calculate BMI. Constitutional Well developed. Well nourished.  Vascular Foot warm and well perfused. Capillary refill normal to all digits.   Neurologic Normal speech. Oriented to person, place, and time. Epicritic sensation to light touch grossly present bilaterally.  Dermatologic Skin healing well without signs of infection. Skin edges well coapted without signs of infection.  Orthopedic: Tenderness to palpation noted about the surgical site.   Radiographs: Toes well aligned and hardware intact. Interval removal of pin with no remaining pieces.  Assessment:   1. Post-operative state    Plan:  Patient was evaluated and treated and all questions answered.  S/p foot surgery left -Progressing as expected post-operatively. -WB Status: Transition to WBAT in regular shoes.  -Pin removed without incident.  -May now start getting the foot wet for showers but avoid any soaking for another few days.  -Medications: N/a  -Foot redressed.  Return in 6 weeks for final check.   No follow-ups on file.

## 2022-12-31 ENCOUNTER — Other Ambulatory Visit (HOSPITAL_COMMUNITY): Payer: Self-pay

## 2023-01-04 ENCOUNTER — Other Ambulatory Visit (HOSPITAL_COMMUNITY): Payer: Self-pay

## 2023-01-04 ENCOUNTER — Other Ambulatory Visit: Payer: Self-pay | Admitting: Family Medicine

## 2023-01-06 ENCOUNTER — Other Ambulatory Visit (HOSPITAL_COMMUNITY): Payer: Self-pay

## 2023-01-07 ENCOUNTER — Other Ambulatory Visit (HOSPITAL_COMMUNITY): Payer: Self-pay

## 2023-01-09 ENCOUNTER — Encounter: Payer: Self-pay | Admitting: Family Medicine

## 2023-01-09 DIAGNOSIS — R5383 Other fatigue: Secondary | ICD-10-CM

## 2023-01-09 DIAGNOSIS — R41840 Attention and concentration deficit: Secondary | ICD-10-CM

## 2023-01-09 DIAGNOSIS — I10 Essential (primary) hypertension: Secondary | ICD-10-CM

## 2023-01-09 DIAGNOSIS — G5603 Carpal tunnel syndrome, bilateral upper limbs: Secondary | ICD-10-CM

## 2023-01-09 DIAGNOSIS — I493 Ventricular premature depolarization: Secondary | ICD-10-CM

## 2023-01-11 ENCOUNTER — Other Ambulatory Visit (HOSPITAL_COMMUNITY): Payer: Self-pay

## 2023-01-11 MED ORDER — VILAZODONE HCL 20 MG PO TABS: 1.0000 | ORAL_TABLET | Freq: Every day | ORAL | 2 refills | Status: DC

## 2023-01-11 MED ORDER — LOSARTAN POTASSIUM 25 MG PO TABS: 25.0000 mg | ORAL_TABLET | Freq: Every day | ORAL | 3 refills | Status: DC

## 2023-01-11 MED ORDER — VERAPAMIL HCL ER 120 MG PO TBCR: 120.0000 mg | EXTENDED_RELEASE_TABLET | Freq: Every day | ORAL | 3 refills | Status: DC

## 2023-01-11 MED ORDER — HYDROCHLOROTHIAZIDE 25 MG PO TABS: 25.0000 mg | ORAL_TABLET | Freq: Every day | ORAL | 3 refills | Status: DC

## 2023-01-11 MED ORDER — LISDEXAMFETAMINE DIMESYLATE 40 MG PO CAPS: 40.0000 mg | ORAL_CAPSULE | Freq: Every day | ORAL | 0 refills | Status: DC

## 2023-01-11 MED ORDER — GABAPENTIN 300 MG PO CAPS
ORAL_CAPSULE | ORAL | 3 refills | Status: DC
Start: 2023-01-11 — End: 2024-01-11
  Filled 2023-08-25 – 2023-10-19 (×2): qty 270, 90d supply, fill #0

## 2023-01-11 MED ORDER — ESTRADIOL 0.075 MG/24HR TD PTTW: MEDICATED_PATCH | TRANSDERMAL | 3 refills | Status: DC

## 2023-01-11 NOTE — Telephone Encounter (Signed)
Request has been forwarded to DR. Metheney.

## 2023-01-11 NOTE — Telephone Encounter (Signed)
Full request has been forwarded to DR. Metheney.

## 2023-01-11 NOTE — Telephone Encounter (Signed)
[facility]  Viazodone 20mg  last written 10/19/2022 Verapamil 120mg  CR last written 11/25/2022 Hydrochlorothiazide 25mg  ;ast wrottem 08/25/2022 Losartan 20mg   last written 09/04/2022 Gabapentin 300mg  last written 02/132024 Lisdexamphetamine 40mg  last written 11/04/2022 Estradiol 02/23/2023  Last OV 11/19/2022 Upcoming appt 02/02/2023

## 2023-01-12 ENCOUNTER — Telehealth: Payer: Self-pay | Admitting: Family Medicine

## 2023-01-12 NOTE — Telephone Encounter (Signed)
Pharmacy advised  

## 2023-01-12 NOTE — Telephone Encounter (Signed)
[facility] called in regards to Vyvanse 40mg  she wanted to let you know that this medication contoured indicated in patient's with cardiac disorders please call phone [phone] Ref number is [id number]

## 2023-01-12 NOTE — Telephone Encounter (Signed)
Okay to give them verbal to fill it.  I am aware.

## 2023-01-13 ENCOUNTER — Telehealth: Payer: Self-pay

## 2023-01-13 NOTE — Telephone Encounter (Signed)
She has tried fluoxetine, trazodone, Wellbutrin so I suspect she will probably end up needing a prior authorization.  I do not know how many of the other medications she has to try before she will qualify or if she has to have tried all of those before the pay for the [facility] I am not sure.

## 2023-01-13 NOTE — Telephone Encounter (Signed)
[facility] - [place] -pharmacy tech called  Return call # [phone] ref# [id number] States the prescription for Vilazodone Hcl 20mg   is being rejected by patient insurance plan.  They are recommending patient try either  Fluoxetine HCL capsules Paroxetine HCL tablet Citalopram hydrobromide tablet  Escitalopram tablet.  Requesting a call back as soon as possible - wanting to get patient medication as quickly as possible.

## 2023-01-15 ENCOUNTER — Telehealth: Payer: Self-pay

## 2023-01-15 NOTE — Telephone Encounter (Addendum)
Initiated Prior authorization RUE:[id number] HCl 20MG  tablets Via: Covermymeds Case/Key:[id number] Status: n/a as of 01/15/23 Reason:plan exclusion Notified Pt via: Mychart

## 2023-01-18 ENCOUNTER — Other Ambulatory Visit: Payer: Self-pay

## 2023-01-20 ENCOUNTER — Telehealth: Payer: Self-pay | Admitting: Family Medicine

## 2023-01-20 NOTE — Telephone Encounter (Signed)
Sent MyChart

## 2023-01-25 ENCOUNTER — Telehealth (INDEPENDENT_AMBULATORY_CARE_PROVIDER_SITE_OTHER): Payer: Managed Care, Other (non HMO) | Admitting: Family Medicine

## 2023-01-25 DIAGNOSIS — Z7689 Persons encountering health services in other specified circumstances: Secondary | ICD-10-CM

## 2023-01-25 DIAGNOSIS — F418 Other specified anxiety disorders: Secondary | ICD-10-CM | POA: Diagnosis not present

## 2023-01-25 MED ORDER — TOPIRAMATE 25 MG PO TABS: ORAL_TABLET | ORAL | 0 refills | Status: DC

## 2023-01-25 MED ORDER — ESCITALOPRAM OXALATE 10 MG PO TABS
ORAL_TABLET | ORAL | 0 refills | Status: DC
Start: 2023-01-25 — End: 2023-02-21

## 2023-01-25 NOTE — Assessment & Plan Note (Signed)
We discussed current options available we are a bit limited because of her new insurance coverage.  Contrave is going to be very expensive so we discussed using Topamax off label and then following up in 6 weeks to see if it is helpful or not.  That was good to be pretty priced reasonable.  Continuing to work on portion control and healthy food options and increasing activity levels.  She had gotten down to 220 pounds and is back up to 230 pounds since being off the medication.  We can always circle back to Contrave as an option as well again as phentermine was not well-tolerated in the past.

## 2023-01-25 NOTE — Progress Notes (Signed)
Pt has new insurance she will run out of the Viibryd by [place] would like to get back on another weight loss medication since she now has [place]. She is unable to take Phentermine due to how it mad her feel.  Pt is going to check her insurance to see what is covered for weight loss medication.

## 2023-01-25 NOTE — Assessment & Plan Note (Signed)
Discussed options I think she be a great candidate for Lexapro.  She can finish out her current dose of Viibryd on Sunday and then switch to Lexapro we will drop her down to 5 mg for a week and then go up to a whole tab.  There may be a bit of a transition between the 2 medications so we will have to give it a couple weeks and see.  I really like to see her back in about 6 weeks either virtually or in person to make sure tolerating the medication well and to see if it still providing significant benefit.

## 2023-01-25 NOTE — Progress Notes (Signed)
Virtual Visit via Video Note  I connected with Kathy Clark on 01/25/23 at 10:50 AM EDT by a video enabled telemedicine application and verified that I am speaking with the correct person using two identifiers.   I discussed the limitations of evaluation and management by telemedicine and the availability of in person appointments. The patient expressed understanding and agreed to proceed.  Patient location: at home Provider location: in office  Subjective:    CC:  No chief complaint on file.   HPI: Here today to discuss medications.  Recently changed insurance companies.  They are invoke a still approved which is great.  Unfortunately the weight loss medication was not.  It looks like insurance will cover Contrave it will be $494 for 90-day supply..  They will not cover the GLP-1's at a reasonable price.  And she does not tolerate phentermine well.  We did initiate a prior authorization for her Viibryd on July 5 but unfortunately it was denied.  They recommended fluoxetine which she is already tried in the past.  Paxil which I would not recommend since she is wanting to lose weight, and citalopram or escitalopram.    Past medical history, Surgical history, Family history not pertinant except as noted below, Social history, Allergies, and medications have been entered into the medical record, reviewed, and corrections made.    Objective:    General: Speaking clearly in complete sentences without any shortness of breath.  Alert and oriented x3.  Normal judgment. No apparent acute distress.    Impression and Recommendations:    Problem List Items Addressed This Visit       Other   Encounter for weight management    We discussed current options available we are a bit limited because of her new insurance coverage.  Contrave is going to be very expensive so we discussed using Topamax off label and then following up in 6 weeks to see if it is helpful or not.  That was good to  be pretty priced reasonable.  Continuing to work on portion control and healthy food options and increasing activity levels.  She had gotten down to 220 pounds and is back up to 230 pounds since being off the medication.  We can always circle back to Contrave as an option as well again as phentermine was not well-tolerated in the past.      Anxious depression - Primary    Discussed options I think she be a great candidate for Lexapro.  She can finish out her current dose of Viibryd on Sunday and then switch to Lexapro we will drop her down to 5 mg for a week and then go up to a whole tab.  There may be a bit of a transition between the 2 medications so we will have to give it a couple weeks and see.  I really like to see her back in about 6 weeks either virtually or in person to make sure tolerating the medication well and to see if it still providing significant benefit.      Relevant Medications   escitalopram (LEXAPRO) 10 MG tablet    No orders of the defined types were placed in this encounter.   Meds ordered this encounter  Medications   escitalopram (LEXAPRO) 10 MG tablet    Sig: Take 0.5 tablets (5 mg total) by mouth daily for 10 days, THEN 1 tablet (10 mg total) daily for 20 days.    Dispense:  30 tablet  Refill:  0   topiramate (TOPAMAX) 25 MG tablet    Sig: Take 1 tablet (25 mg total) by mouth at bedtime for 3 days, THEN 1 tablet (25 mg total) 2 (two) times daily for 4 days, THEN 2 tablets (50 mg total) 2 (two) times daily for 23 days.    Dispense:  103 tablet    Refill:  0     I discussed the assessment and treatment plan with the patient. The patient was provided an opportunity to ask questions and all were answered. The patient agreed with the plan and demonstrated an understanding of the instructions.   The patient was advised to call back or seek an in-person evaluation if the symptoms worsen or if the condition fails to improve as anticipated.   I spent 25 minutes on  the day of the encounter to include pre-visit record review, face-to-face time with the patient and post visit ordering of test.  Nani Gasser, MD

## 2023-01-28 ENCOUNTER — Telehealth: Payer: Managed Care, Other (non HMO) | Admitting: Physician Assistant

## 2023-01-28 ENCOUNTER — Ambulatory Visit: Payer: Commercial Managed Care - PPO | Admitting: Podiatry

## 2023-01-28 ENCOUNTER — Telehealth: Payer: Self-pay | Admitting: *Deleted

## 2023-01-28 DIAGNOSIS — U071 COVID-19: Secondary | ICD-10-CM | POA: Diagnosis not present

## 2023-01-28 MED ORDER — NIRMATRELVIR/RITONAVIR (PAXLOVID)TABLET
3.0000 | ORAL_TABLET | Freq: Two times a day (BID) | ORAL | 0 refills | Status: AC
Start: 2023-01-28 — End: 2023-02-02

## 2023-01-28 MED ORDER — PROMETHAZINE-DM 6.25-15 MG/5ML PO SYRP: 5.0000 mL | ORAL_SOLUTION | Freq: Four times a day (QID) | ORAL | 0 refills | Status: DC | PRN

## 2023-01-28 NOTE — Telephone Encounter (Signed)
Advised patient to complete Paxlovid as prescribed and advised she can resume Rinvoq two weeks after her symptoms resolve. Did discuss this with Sue Lush LPN.

## 2023-01-28 NOTE — Telephone Encounter (Signed)
Patient contacted the office and left message stating she has Covid. Patient states she is being started on Paxlovid. Patient is on Rinvoq and could not remember how long she would need to hold the medication for.   Attempted to contact the patient and left message for patient to call the office.

## 2023-01-28 NOTE — Patient Instructions (Signed)
Kathy Clark, thank you for joining [name], PA-C for today's virtual visit.  While this provider is not your primary care provider (PCP), if your PCP is located in our provider database this encounter information will be shared with them immediately following your visit.   A [facility] MyChart account gives you access to today's visit and all your visits, tests, and labs performed at [facility] " click here if you don't have a  MyChart account or go to [email or web address]  Consent: (Patient) Kathy Clark provided verbal consent for this virtual visit at the beginning of the encounter.  Current Medications:  Current Outpatient Medications:    nirmatrelvir/ritonavir (PAXLOVID) 20 x 150 MG & 10 x 100MG  TABS, Take 3 tablets by mouth 2 (two) times daily for 5 days. (Take nirmatrelvir 150 mg two tablets twice daily for 5 days and ritonavir 100 mg one tablet twice daily for 5 days) Patient GFR is 98, Disp: 30 tablet, Rfl: 0   promethazine-dextromethorphan (PROMETHAZINE-DM) 6.25-15 MG/5ML syrup, Take 5 mLs by mouth 4 (four) times daily as needed for cough., Disp: 118 mL, Rfl: 0   AMBULATORY NON FORMULARY MEDICATION, Medication Name: Please decrease CPAP setting to 8 cm of water pressure and fax [place] a download after 5 days.  Is having a lot of difficulty with air leaks is working to try to reduce her pressure to see if she is still being adequately treated but with fewer leaks.FAx to [facility], Disp: 1 Units, Rfl: 0   aspirin EC 81 MG tablet, Take 1 tablet (81 mg total) by mouth in the morning and at bedtime. Swallow whole., Disp: 30 tablet, Rfl: 12   Calcium Carbonate-Vitamin D (CALTRATE 600+D PO), Take 1 tablet by mouth daily., Disp: , Rfl:    doxylamine, Sleep, (UNISOM) 25 MG tablet, , Disp: , Rfl:    EPINEPHrine 0.3 mg/0.3 mL IJ SOAJ injection, Inject 0.3 mg into the muscle as needed for anaphylaxis., Disp: 2 each, Rfl: PRN   escitalopram  (LEXAPRO) 10 MG tablet, Take 0.5 tablets (5 mg total) by mouth daily for 10 days, THEN 1 tablet (10 mg total) daily for 20 days., Disp: 30 tablet, Rfl: 0   estradiol (VIVELLE-DOT) 0.075 MG/24HR, Apply 1 patch to skin twice weekly as directed, Disp: 24 patch, Rfl: 3   fluticasone (FLONASE) 50 MCG/ACT nasal spray, Place 1 to 2 sprays into both nostrils daily., Disp: 48 g, Rfl: 3   gabapentin (NEURONTIN) 300 MG capsule, Take 1 capsule (300 mg total) by mouth every morning AND 2 capsules (600 mg total) at bedtime., Disp: 270 capsule, Rfl: 3   hydrochlorothiazide (HYDRODIURIL) 25 MG tablet, Take 1 tablet (25 mg total) by mouth daily., Disp: 90 tablet, Rfl: 3   lisdexamfetamine (VYVANSE) 40 MG capsule, Take 1 capsule (40 mg total) by mouth daily., Disp: 90 capsule, Rfl: 0   losartan (COZAAR) 25 MG tablet, Take 1 tablet (25 mg total) by mouth daily., Disp: 90 tablet, Rfl: 3   methocarbamol (ROBAXIN) 500 MG tablet, Take one tablet (500 mg dose) by mouth 3 (three) times a day as needed., Disp: 90 tablet, Rfl: 2   Multiple Vitamins-Minerals (MULTIVITAMIN ADULT PO), Take by mouth daily., Disp: , Rfl:    ondansetron (ZOFRAN) 4 MG tablet, Take 1 tablet (4 mg total) by mouth every 8 (eight) hours as needed for nausea or vomiting., Disp: 20 tablet, Rfl: 0   SUMAtriptan (IMITREX) 20 MG/ACT nasal spray, PLACE 1 SPRAY INTO THE NOSE  EVERY 2 HOURS AS NEEDED FOR HEADACHE, Disp: 6 each, Rfl: 5   topiramate (TOPAMAX) 25 MG tablet, Take 1 tablet (25 mg total) by mouth at bedtime for 3 days, THEN 1 tablet (25 mg total) 2 (two) times daily for 4 days, THEN 2 tablets (50 mg total) 2 (two) times daily for 23 days., Disp: 103 tablet, Rfl: 0   Upadacitinib ER (RINVOQ) 15 MG TB24, Take one tablet (15 mg dose) by mouth daily., Disp: 90 tablet, Rfl: 0   verapamil (CALAN-SR) 120 MG CR tablet, Take 1 tablet (120 mg total) by mouth at bedtime., Disp: 90 tablet, Rfl: 3   Medications ordered in this encounter:  Meds ordered this  encounter  Medications   nirmatrelvir/ritonavir (PAXLOVID) 20 x 150 MG & 10 x 100MG  TABS    Sig: Take 3 tablets by mouth 2 (two) times daily for 5 days. (Take nirmatrelvir 150 mg two tablets twice daily for 5 days and ritonavir 100 mg one tablet twice daily for 5 days) Patient GFR is 98    Dispense:  30 tablet    Refill:  0    Order Specific Question:   Supervising Provider    Answer:   [name] Jansky [[id number]]   promethazine-dextromethorphan (PROMETHAZINE-DM) 6.25-15 MG/5ML syrup    Sig: Take 5 mLs by mouth 4 (four) times daily as needed for cough.    Dispense:  118 mL    Refill:  0    Order Specific Question:   Supervising Provider    Answer:   [name] Jansky [[id number]]     *If you need refills on other medications prior to your next appointment, please contact your pharmacy*  Follow-Up: Call back or seek an in-person evaluation if the symptoms worsen or if the condition fails to improve as anticipated.  [facility] [phone]  Care Instructions: Please keep well-hydrated and get plenty of rest. Start a saline nasal rinse to flush out your nasal passages. You can use plain Mucinex to help thin congestion. Take the Paxlovid as directed. Remember to hold the Rinvoq and contact your Rheumatologist for further instructions. If you have a humidifier, running in the bedroom at night. I want you to start OTC vitamin D3 1000 units daily, vitamin C 1000 mg daily, and a zinc supplement. Please take prescribed medications as directed.     Isolation Instructions: You are to isolate at home until you have been fever free for at least 24 hours without a fever-reducing medication, and symptoms have been steadily improving for 24 hours. At that time,  you can end isolation but need to mask for an additional 5 days.   If you must be around other household members who do not have symptoms, you need to make sure that both you and the family members are masking consistently  with a high-quality mask.  If you note any worsening of symptoms despite treatment, please seek an in-person evaluation ASAP. If you note any significant shortness of breath or any chest pain, please seek ER evaluation. Please do not delay care!   COVID-19: What to Do if You Are Sick If you test positive and are an older adult or someone who is at high risk of getting very sick from COVID-19, treatment may be available. Contact a healthcare provider right away after a positive test to determine if you are eligible, even if your symptoms are mild right now. You can also visit a Test to Treat location and, if eligible, receive  a prescription from a provider. Don't delay: Treatment must be started within the first few days to be effective. If you have a fever, cough, or other symptoms, you might have COVID-19. Most people have mild illness and are able to recover at home. If you are sick: Keep track of your symptoms. If you have an emergency warning sign (including trouble breathing), call 911. Steps to help prevent the spread of COVID-19 if you are sick If you are sick with COVID-19 or think you might have COVID-19, follow the steps below to care for yourself and to help protect other people in your home and community. Stay home except to get medical care Stay home. Most people with COVID-19 have mild illness and can recover at home without medical care. Do not leave your home, except to get medical care. Do not visit public areas and do not go to places where you are unable to wear a mask. Take care of yourself. Get rest and stay hydrated. Take over-the-counter medicines, such as acetaminophen, to help you feel better. Stay in touch with your doctor. Call before you get medical care. Be sure to get care if you have trouble breathing, or have any other emergency warning signs, or if you think it is an emergency. Avoid public transportation, ride-sharing, or taxis if possible. Get tested If you have  symptoms of COVID-19, get tested. While waiting for test results, stay away from others, including staying apart from those living in your household. Get tested as soon as possible after your symptoms start. Treatments may be available for people with COVID-19 who are at risk for becoming very sick. Don't delay: Treatment must be started early to be effective--some treatments must begin within 5 days of your first symptoms. Contact your healthcare provider right away if your test result is positive to determine if you are eligible. Self-tests are one of several options for testing for the virus that causes COVID-19 and may be more convenient than laboratory-based tests and point-of-care tests. Ask your healthcare provider or your local health department if you need help interpreting your test results. You can visit your state, tribal, local, and territorial health department's website to look for the latest local information on testing sites. Separate yourself from other people As much as possible, stay in a specific room and away from other people and pets in your home. If possible, you should use a separate bathroom. If you need to be around other people or animals in or outside of the home, wear a well-fitting mask. Tell your close contacts that they may have been exposed to COVID-19. An infected person can spread COVID-19 starting 48 hours (or 2 days) before the person has any symptoms or tests positive. By letting your close contacts know they may have been exposed to COVID-19, you are helping to protect everyone. See COVID-19 and Animals if you have questions about pets. If you are diagnosed with COVID-19, someone from the health department may call you. Answer the call to slow the spread. Monitor your symptoms Symptoms of COVID-19 include fever, cough, or other symptoms. Follow care instructions from your healthcare provider and local health department. Your local health authorities may give  instructions on checking your symptoms and reporting information. When to seek emergency medical attention Look for emergency warning signs* for COVID-19. If someone is showing any of these signs, seek emergency medical care immediately: Trouble breathing Persistent pain or pressure in the chest New confusion Inability to wake or stay awake Pale,  gray, or blue-colored skin, lips, or nail beds, depending on skin tone *This list is not all possible symptoms. Please call your medical provider for any other symptoms that are severe or concerning to you. Call 911 or call ahead to your local emergency facility: Notify the operator that you are seeking care for someone who has or may have COVID-19. Call ahead before visiting your doctor Call ahead. Many medical visits for routine care are being postponed or done by phone or telemedicine. If you have a medical appointment that cannot be postponed, call your doctor's office, and tell them you have or may have COVID-19. This will help the office protect themselves and other patients. If you are sick, wear a well-fitting mask You should wear a mask if you must be around other people or animals, including pets (even at home). Wear a mask with the best fit, protection, and comfort for you. You don't need to wear the mask if you are alone. If you can't put on a mask (because of trouble breathing, for example), cover your coughs and sneezes in some other way. Try to stay at least 6 feet away from other people. This will help protect the people around you. Masks should not be placed on young children under age 28 years, anyone who has trouble breathing, or anyone who is not able to remove the mask without help. Cover your coughs and sneezes Cover your mouth and nose with a tissue when you cough or sneeze. Throw away used tissues in a lined trash can. Immediately wash your hands with soap and water for at least 20 seconds. If soap and water are not available,  clean your hands with an alcohol-based hand sanitizer that contains at least 60% alcohol. Clean your hands often Wash your hands often with soap and water for at least 20 seconds. This is especially important after blowing your nose, coughing, or sneezing; going to the bathroom; and before eating or preparing food. Use hand sanitizer if soap and water are not available. Use an alcohol-based hand sanitizer with at least 60% alcohol, covering all surfaces of your hands and rubbing them together until they feel dry. Soap and water are the best option, especially if hands are visibly dirty. Avoid touching your eyes, nose, and mouth with unwashed hands. Handwashing Tips Avoid sharing personal household items Do not share dishes, drinking glasses, cups, eating utensils, towels, or bedding with other people in your home. Wash these items thoroughly after using them with soap and water or put in the dishwasher. Clean surfaces in your home regularly Clean and disinfect high-touch surfaces (for example, doorknobs, tables, handles, light switches, and countertops) in your "sick room" and bathroom. In shared spaces, you should clean and disinfect surfaces and items after each use by the person who is ill. If you are sick and cannot clean, a caregiver or other person should only clean and disinfect the area around you (such as your bedroom and bathroom) on an as needed basis. Your caregiver/other person should wait as long as possible (at least several hours) and wear a mask before entering, cleaning, and disinfecting shared spaces that you use. Clean and disinfect areas that may have blood, stool, or body fluids on them. Use household cleaners and disinfectants. Clean visible dirty surfaces with household cleaners containing soap or detergent. Then, use a household disinfectant. Use a product from [employer] List N: Disinfectants for Coronavirus (COVID-19). Be sure to follow the instructions on the label to ensure  safe and  effective use of the product. Many products recommend keeping the surface wet with a disinfectant for a certain period of time (look at "contact time" on the product label). You may also need to wear personal protective equipment, such as gloves, depending on the directions on the product label. Immediately after disinfecting, wash your hands with soap and water for 20 seconds. For completed guidance on cleaning and disinfecting your home, visit Complete Disinfection Guidance. Take steps to improve ventilation at home Improve ventilation (air flow) at home to help prevent from spreading COVID-19 to other people in your household. Clear out COVID-19 virus particles in the air by opening windows, using air filters, and turning on fans in your home. Use this interactive tool to learn how to improve air flow in your home. When you can be around others after being sick with COVID-19 Deciding when you can be around others is different for different situations. Find out when you can safely end home isolation. For any additional questions about your care, contact your healthcare provider or state or local health department. 10/01/2020 Content source: [facility] for Immunization and Respiratory Diseases ([facility]), Division of Viral Diseases This information is not intended to replace advice given to you by your health care provider. Make sure you discuss any questions you have with your health care provider. Document Revised: 11/14/2020 Document Reviewed: 11/14/2020 Elsevier Patient Education  2022 [employer].     If you have been instructed to have an in-person evaluation today at a local Urgent Care facility, please use the link below. It will take you to a list of all of our available [facility], including address, phone number and hours of operation. Please do not delay care.  [facility]  If you or a family member do not have a primary care provider, use  the link below to schedule a visit and establish care. When you choose a [facility] primary care physician or advanced practice provider, you gain a long-term partner in health. Find a Primary Care Provider  Learn more about [facility] in-office and virtual care options: [facility] Get Care Now

## 2023-01-28 NOTE — Progress Notes (Signed)
Virtual Visit Consent   Kathy Clark, you are scheduled for a virtual visit with a [facility] provider today. Just as with appointments in the office, your consent must be obtained to participate. Your consent will be active for this visit and any virtual visit you may have with one of our providers in the next 365 days. If you have a MyChart account, a copy of this consent can be sent to you electronically.  As this is a virtual visit, video technology does not allow for your provider to perform a traditional examination. This may limit your provider's ability to fully assess your condition. If your provider identifies any concerns that need to be evaluated in person or the need to arrange testing (such as labs, EKG, etc.), we will make arrangements to do so. Although advances in technology are sophisticated, we cannot ensure that it will always work on either your end or our end. If the connection with a video visit is poor, the visit may have to be switched to a telephone visit. With either a video or telephone visit, we are not always able to ensure that we have a secure connection.  By engaging in this virtual visit, you consent to the provision of healthcare and authorize for your insurance to be billed (if applicable) for the services provided during this visit. Depending on your insurance coverage, you may receive a charge related to this service.  I need to obtain your verbal consent now. Are you willing to proceed with your visit today? Kathy Clark has provided verbal consent on 01/28/2023 for a virtual visit (video or telephone). Kathy Clark, [place]  Date: 01/28/2023 2:08 PM  Virtual Visit via Video Note   I, Kathy Clark, connected with  Kathy Clark  ([id number], Nov 28, 1960) on 01/28/23 at  2:30 PM EDT by a video-enabled telemedicine application and verified that I am speaking with the correct person using two identifiers.  Location: Patient: Virtual Visit Location  Patient: Home Provider: Virtual Visit Location Provider: Home Office   I discussed the limitations of evaluation and management by telemedicine and the availability of in person appointments. The patient expressed understanding and agreed to proceed.    History of Present Illness: Kathy Clark is a 62 y.o. who identifies as a female who was assigned female at birth, and is being seen today for COVID-19. Mild cough Sunday but otherwise fine. Over next 48 hours with headache, fatigue, substantial sinus congestion and cough, with sinus pressure and ear pressure. Took a COVID test today that was positive. Denies chest pain or SOB. Denies GI symptoms. Taken Paxlovid before and tolerated without issue.   HPI: HPI  Problems:  Patient Active Problem List   Diagnosis Date Noted   Seronegative rheumatoid arthritis (HCC) 09/29/2022   High risk medication use 09/29/2022   Inattention 01/09/2022   Other fatigue 08/29/2021   Indication present for endocarditis prophylaxis 05/07/2020   PVC (premature ventricular contraction) 05/07/2020   Major depressive disorder with single episode, in partial remission (HCC) 05/07/2020   Bursitis of left shoulder 02/08/2020   Encounter for weight management 01/10/2020   Sleeping difficulties 01/10/2020   Rheumatoid arthritis of multiple sites with negative rheumatoid factor (HCC) 12/04/2019   Polyarthralgia 11/02/2019   Myofascial pain 11/02/2019   Arthralgia of both hands 10/03/2019   Metatarsalgia of left foot 01/05/2018   Other hyperlipidemia 03/23/2017   OSA (obstructive sleep apnea) 02/25/2017   Class 2 obesity due to excess calories with  body mass index (BMI) of 38.0 to 38.9 in adult 08/17/2016   Vitamin D deficiency 08/03/2016   Anxious depression 02/20/2016   Left carpal tunnel syndrome, post right carpal tunnel release 12/24/2014   Trochanteric bursitis of right hip 04/21/2013   Postop Mild Hyponatremia 07/30/2011   OSTEOARTHRITIS, KNEE, LEFT  12/12/2009   Migraine 05/31/2008   SKIN CANCER, HX OF 01/06/2008   COLITIS, HX OF 01/06/2008   ALLERGIC RHINITIS 05/20/2007   HYPERTENSION, BENIGN ESSENTIAL 11/23/2006   SCIATICA, CHRONIC 05/19/2006    Allergies:  Allergies  Allergen Reactions   Succinylcholine Anaphylaxis   Dilaudid [Hydromorphone Hcl] Nausea And Vomiting   Nsaids Other (See Comments)    Trigger colits   Phentermine Other (See Comments)    Palpitations   Plaquenil [Hydroxychloroquine] Other (See Comments)    Chronic tinnitus   Medications:  Current Outpatient Medications:    nirmatrelvir/ritonavir (PAXLOVID) 20 x 150 MG & 10 x 100MG  TABS, Take 3 tablets by mouth 2 (two) times daily for 5 days. (Take nirmatrelvir 150 mg two tablets twice daily for 5 days and ritonavir 100 mg one tablet twice daily for 5 days) Patient GFR is 98, Disp: 30 tablet, Rfl: 0   promethazine-dextromethorphan (PROMETHAZINE-DM) 6.25-15 MG/5ML syrup, Take 5 mLs by mouth 4 (four) times daily as needed for cough., Disp: 118 mL, Rfl: 0   AMBULATORY NON FORMULARY MEDICATION, Medication Name: Please decrease CPAP setting to 8 cm of water pressure and fax [place] a download after 5 days.  Is having a lot of difficulty with air leaks is working to try to reduce her pressure to see if she is still being adequately treated but with fewer leaks.FAx to [facility], Disp: 1 Units, Rfl: 0   aspirin EC 81 MG tablet, Take 1 tablet (81 mg total) by mouth in the morning and at bedtime. Swallow whole., Disp: 30 tablet, Rfl: 12   Calcium Carbonate-Vitamin D (CALTRATE 600+D PO), Take 1 tablet by mouth daily., Disp: , Rfl:    doxylamine, Sleep, (UNISOM) 25 MG tablet, , Disp: , Rfl:    EPINEPHrine 0.3 mg/0.3 mL IJ SOAJ injection, Inject 0.3 mg into the muscle as needed for anaphylaxis., Disp: 2 each, Rfl: PRN   escitalopram (LEXAPRO) 10 MG tablet, Take 0.5 tablets (5 mg total) by mouth daily for 10 days, THEN 1 tablet (10 mg total) daily for 20 days., Disp: 30 tablet,  Rfl: 0   estradiol (VIVELLE-DOT) 0.075 MG/24HR, Apply 1 patch to skin twice weekly as directed, Disp: 24 patch, Rfl: 3   fluticasone (FLONASE) 50 MCG/ACT nasal spray, Place 1 to 2 sprays into both nostrils daily., Disp: 48 g, Rfl: 3   gabapentin (NEURONTIN) 300 MG capsule, Take 1 capsule (300 mg total) by mouth every morning AND 2 capsules (600 mg total) at bedtime., Disp: 270 capsule, Rfl: 3   hydrochlorothiazide (HYDRODIURIL) 25 MG tablet, Take 1 tablet (25 mg total) by mouth daily., Disp: 90 tablet, Rfl: 3   lisdexamfetamine (VYVANSE) 40 MG capsule, Take 1 capsule (40 mg total) by mouth daily., Disp: 90 capsule, Rfl: 0   losartan (COZAAR) 25 MG tablet, Take 1 tablet (25 mg total) by mouth daily., Disp: 90 tablet, Rfl: 3   methocarbamol (ROBAXIN) 500 MG tablet, Take one tablet (500 mg dose) by mouth 3 (three) times a day as needed., Disp: 90 tablet, Rfl: 2   Multiple Vitamins-Minerals (MULTIVITAMIN ADULT PO), Take by mouth daily., Disp: , Rfl:    ondansetron (ZOFRAN) 4 MG tablet, Take  1 tablet (4 mg total) by mouth every 8 (eight) hours as needed for nausea or vomiting., Disp: 20 tablet, Rfl: 0   SUMAtriptan (IMITREX) 20 MG/ACT nasal spray, PLACE 1 SPRAY INTO THE NOSE EVERY 2 HOURS AS NEEDED FOR HEADACHE, Disp: 6 each, Rfl: 5   topiramate (TOPAMAX) 25 MG tablet, Take 1 tablet (25 mg total) by mouth at bedtime for 3 days, THEN 1 tablet (25 mg total) 2 (two) times daily for 4 days, THEN 2 tablets (50 mg total) 2 (two) times daily for 23 days., Disp: 103 tablet, Rfl: 0   Upadacitinib ER (RINVOQ) 15 MG TB24, Take one tablet (15 mg dose) by mouth daily., Disp: 90 tablet, Rfl: 0   verapamil (CALAN-SR) 120 MG CR tablet, Take 1 tablet (120 mg total) by mouth at bedtime., Disp: 90 tablet, Rfl: 3  Observations/Objective: Patient is well-developed, well-nourished in no acute distress.  Resting comfortably at home.  Head is normocephalic, atraumatic.  No labored breathing. Speech is clear and coherent with  logical content.  Patient is alert and oriented at baseline.   Assessment and Plan: 1. COVID-19 - nirmatrelvir/ritonavir (PAXLOVID) 20 x 150 MG & 10 x 100MG  TABS; Take 3 tablets by mouth 2 (two) times daily for 5 days. (Take nirmatrelvir 150 mg two tablets twice daily for 5 days and ritonavir 100 mg one tablet twice daily for 5 days) Patient GFR is 98  Dispense: 30 tablet; Refill: 0 - promethazine-dextromethorphan (PROMETHAZINE-DM) 6.25-15 MG/5ML syrup; Take 5 mLs by mouth 4 (four) times daily as needed for cough.  Dispense: 118 mL; Refill: 0  Patient with multiple risk factors for complicated course of illness. Discussed risks/benefits of antiviral medications including most common potential ADRs. Patient voiced understanding and would like to proceed with antiviral medication. They are candidate for Paxlovid. Rx sent to pharmacy. Supportive measures, OTC medications and vitamin regimen reviewed. Promethazine-DM per orders. She is to hold Rinvoq and speak to Rheumatologist about time to restart. Quarantine reviewed in detail. Strict ER precautions discussed with patient.    Follow Up Instructions: I discussed the assessment and treatment plan with the patient. The patient was provided an opportunity to ask questions and all were answered. The patient agreed with the plan and demonstrated an understanding of the instructions.  A copy of instructions were sent to the patient via MyChart unless otherwise noted below.   The patient was advised to call back or seek an in-person evaluation if the symptoms worsen or if the condition fails to improve as anticipated.  Time:  I spent 10 minutes with the patient via telehealth technology discussing the above problems/concerns.    Kathy Climes, PA-C

## 2023-02-02 ENCOUNTER — Ambulatory Visit: Payer: Managed Care, Other (non HMO) | Admitting: Family Medicine

## 2023-02-02 ENCOUNTER — Other Ambulatory Visit (HOSPITAL_COMMUNITY): Payer: Self-pay

## 2023-02-03 ENCOUNTER — Other Ambulatory Visit (HOSPITAL_COMMUNITY): Payer: Self-pay

## 2023-02-03 ENCOUNTER — Other Ambulatory Visit: Payer: Self-pay

## 2023-02-04 ENCOUNTER — Ambulatory Visit: Payer: Managed Care, Other (non HMO) | Admitting: Podiatry

## 2023-02-04 ENCOUNTER — Encounter: Payer: Self-pay | Admitting: Podiatry

## 2023-02-04 DIAGNOSIS — Z9889 Other specified postprocedural states: Secondary | ICD-10-CM

## 2023-02-04 NOTE — Progress Notes (Signed)
Subjective:  Patient ID: Kathy Clark, female    DOB: 20-Mar-1961,  MRN: [id number]  Chief Complaint  Patient presents with   Routine Post Op    Pt came in for a routine post op. Pt state that she is doing better.    DOS: 11/03/22  Procedure: Left second hammertoe repair and left weil osteotomy. Left tailors bunionectomy.   62 y.o. female returns for POV#4. Relates doing well. Relates certain shoes she will still have some pain but over all doing better.   Review of Systems: Negative except as noted in the HPI. Denies N/V/F/Ch.  Past Medical History:  Diagnosis Date   Anxiety    Back pain    Complication of anesthesia    allergy to Succinylcholine   Depression    Essential hypertension, benign    Gastritis    GERD (gastroesophageal reflux disease)    Joint pain    Lymphocytic colitis    microscopic   Migraines    Osteoarthritis    Sciatica    right leg   Swelling    feet, left foot    Current Outpatient Medications:    AMBULATORY NON FORMULARY MEDICATION, Medication Name: Please decrease CPAP setting to 8 cm of water pressure and fax [place] a download after 5 days.  Is having a lot of difficulty with air leaks is working to try to reduce her pressure to see if she is still being adequately treated but with fewer leaks.FAx to [facility], Disp: 1 Units, Rfl: 0   aspirin EC 81 MG tablet, Take 1 tablet (81 mg total) by mouth in the morning and at bedtime. Swallow whole., Disp: 30 tablet, Rfl: 12   Calcium Carbonate-Vitamin D (CALTRATE 600+D PO), Take 1 tablet by mouth daily., Disp: , Rfl:    doxylamine, Sleep, (UNISOM) 25 MG tablet, , Disp: , Rfl:    EPINEPHrine 0.3 mg/0.3 mL IJ SOAJ injection, Inject 0.3 mg into the muscle as needed for anaphylaxis., Disp: 2 each, Rfl: PRN   escitalopram (LEXAPRO) 10 MG tablet, Take 0.5 tablets (5 mg total) by mouth daily for 10 days, THEN 1 tablet (10 mg total) daily for 20 days., Disp: 30 tablet, Rfl: 0   estradiol (VIVELLE-DOT) 0.075  MG/24HR, Apply 1 patch to skin twice weekly as directed, Disp: 24 patch, Rfl: 3   fluticasone (FLONASE) 50 MCG/ACT nasal spray, Place 1 to 2 sprays into both nostrils daily., Disp: 48 g, Rfl: 3   gabapentin (NEURONTIN) 300 MG capsule, Take 1 capsule (300 mg total) by mouth every morning AND 2 capsules (600 mg total) at bedtime., Disp: 270 capsule, Rfl: 3   hydrochlorothiazide (HYDRODIURIL) 25 MG tablet, Take 1 tablet (25 mg total) by mouth daily., Disp: 90 tablet, Rfl: 3   lisdexamfetamine (VYVANSE) 40 MG capsule, Take 1 capsule (40 mg total) by mouth daily., Disp: 90 capsule, Rfl: 0   losartan (COZAAR) 25 MG tablet, Take 1 tablet (25 mg total) by mouth daily., Disp: 90 tablet, Rfl: 3   methocarbamol (ROBAXIN) 500 MG tablet, Take one tablet (500 mg dose) by mouth 3 (three) times a day as needed., Disp: 90 tablet, Rfl: 2   Multiple Vitamins-Minerals (MULTIVITAMIN ADULT PO), Take by mouth daily., Disp: , Rfl:    ondansetron (ZOFRAN) 4 MG tablet, Take 1 tablet (4 mg total) by mouth every 8 (eight) hours as needed for nausea or vomiting., Disp: 20 tablet, Rfl: 0   promethazine-dextromethorphan (PROMETHAZINE-DM) 6.25-15 MG/5ML syrup, Take 5 mLs by mouth 4 (  four) times daily as needed for cough., Disp: 118 mL, Rfl: 0   SUMAtriptan (IMITREX) 20 MG/ACT nasal spray, PLACE 1 SPRAY INTO THE NOSE EVERY 2 HOURS AS NEEDED FOR HEADACHE, Disp: 6 each, Rfl: 5   topiramate (TOPAMAX) 25 MG tablet, Take 1 tablet (25 mg total) by mouth at bedtime for 3 days, THEN 1 tablet (25 mg total) 2 (two) times daily for 4 days, THEN 2 tablets (50 mg total) 2 (two) times daily for 23 days., Disp: 103 tablet, Rfl: 0   Upadacitinib ER (RINVOQ) 15 MG TB24, Take one tablet (15 mg dose) by mouth daily., Disp: 90 tablet, Rfl: 0   verapamil (CALAN-SR) 120 MG CR tablet, Take 1 tablet (120 mg total) by mouth at bedtime., Disp: 90 tablet, Rfl: 3  Social History   Tobacco Use  Smoking Status Never   Passive exposure: Past  Smokeless  Tobacco Never    Allergies  Allergen Reactions   Succinylcholine Anaphylaxis   Dilaudid [Hydromorphone Hcl] Nausea And Vomiting   Nsaids Other (See Comments)    Trigger colits   Phentermine Other (See Comments)    Palpitations   Plaquenil [Hydroxychloroquine] Other (See Comments)    Chronic tinnitus   Objective:  There were no vitals filed for this visit. There is no height or weight on file to calculate BMI. Constitutional Well developed. Well nourished.  Vascular Foot warm and well perfused. Capillary refill normal to all digits.   Neurologic Normal speech. Oriented to person, place, and time. Epicritic sensation to light touch grossly present bilaterally.  Dermatologic Skin healing well without signs of infection. Skin edges well coapted without signs of infection.  Orthopedic: Tenderness to palpation noted about the surgical site.   Radiographs: Toes well aligned and hardware intact. Interval removal of pin with no remaining pieces.  Assessment:   1. Post-operative state     Plan:  Patient was evaluated and treated and all questions answered.  S/p foot surgery left -Progressing as expected post-operatively. -WB Status: WBAT in regular shoes.  -Medications: N/a   Discharged from surgical standpoint  Return if symptoms worsen or fail to improve.

## 2023-02-05 ENCOUNTER — Encounter: Payer: Self-pay | Admitting: Family Medicine

## 2023-02-05 MED ORDER — AZITHROMYCIN 250 MG PO TABS: ORAL_TABLET | ORAL | 0 refills | Status: AC

## 2023-02-14 ENCOUNTER — Encounter: Payer: Self-pay | Admitting: Family Medicine

## 2023-02-16 ENCOUNTER — Encounter: Payer: Self-pay | Admitting: Family Medicine

## 2023-02-16 ENCOUNTER — Ambulatory Visit: Payer: Managed Care, Other (non HMO) | Admitting: Family Medicine

## 2023-02-16 VITALS — BP 127/83 | HR 73 | Ht 68.0 in | Wt 226.0 lb

## 2023-02-16 DIAGNOSIS — L509 Urticaria, unspecified: Secondary | ICD-10-CM | POA: Diagnosis not present

## 2023-02-16 DIAGNOSIS — R21 Rash and other nonspecific skin eruption: Secondary | ICD-10-CM

## 2023-02-16 NOTE — Progress Notes (Addendum)
Acute Office Visit  Subjective:     Patient ID: Kathy Clark, female    DOB: [birth year], 62 y.o.   MRN: [id number]  Chief Complaint  Patient presents with   Rash    She reports that she began breaking out on her face,neck and upper chest she related this to possibly when given medication for Covid she denies any changes to lotions,soaps,diet,detergent. She has been using cortisone and Benadryl to help with the itching.     HPI Patient is in today for rash on and off x 1 week.    Background & timeline: July 18 + Covid test & was started on Paxlovid. July 20 developed red itchy rash around neck, on chest, and under bra area. Stopped Paxlovid. On July 22, I started the 2 new meds Topamax (ramp up) and Lexapro (ramp up). On July 26, Sinusitis symptoms - Started on Z-pak which I completed on 7/30. I have for over a week now been having "pricking" sensations and itching, sometimes with no visible rash, and other times with what look like urticaria and redness. No blistering. Benadryl helps.   He is also noticed a lot of brain fog making simple mistakes and not being as attentive during driving.  Has been holding the written Zenia Resides since she has been sick.  Would like to be able to restart it as soon as possible.  ROS      Objective:    BP 127/83   Pulse 73   Ht 5\' 8"  (1.727 m)   Wt 226 lb (102.5 kg)   LMP  (LMP Unknown) Comment: hystrectomy  SpO2 100%   BMI 34.36 kg/m    Physical Exam Vitals reviewed.  Constitutional:      Appearance: She is well-developed.  HENT:     Head: Normocephalic and atraumatic.  Eyes:     Conjunctiva/sclera: Conjunctivae normal.  Cardiovascular:     Rate and Rhythm: Normal rate.  Pulmonary:     Effort: Pulmonary effort is normal.  Skin:    General: Skin is dry.     Coloration: Skin is not pale.     Comments: Edema of her upper chest and closer to the mid chest a few scattered papules with some excoriations.  Neurological:     Mental  Status: She is alert and oriented to person, place, and time.  Psychiatric:        Behavior: Behavior normal.     No results found for any visits on 02/16/23.      Assessment & Plan:   Problem List Items Addressed This Visit   None Visit Diagnoses     Rash    -  Primary   Relevant Orders   CBC with Differential/Platelet   Hives       Relevant Orders   CBC with Differential/Platelet      Rash - -Most consistent with COVID rash.  Discussed doing a 24-hour acting antihistamine such as Allegra or Zyrtec daily for the next 1 to 2 weeks.  Which dries the skin well with the dye free perfume products such as Aveeno, Cetaphil or CeraVe A.  Brain fog-discussed discontinuing the Topamax for now just to make sure that it is not contributing.  Also could be related to having had COVID recently.  If things improve we can always retry the Topamax.  It did cause some numbness in the hands and a little bit in the feet.  RA - Would like to be able to restart the  Rinvoq.  Lets check a CBC with differential today just to make sure that the cell lines are normal and no concerning features before restarting the Rinvoq tomorrow.  No orders of the defined types were placed in this encounter.   Return in about 8 weeks (around 04/13/2023) for new start lexapro.  Nani Gasser, MD

## 2023-02-17 NOTE — Progress Notes (Signed)
Kathy Clark, the CBC looks great.  So if you want to go ahead and administer the written Volk today I think it safe to do so.  Let me know if the hives are not resolving over the next 2 weeks.  If you have time shoot me a note to as far as the brain fog and if that gets better off of the topiramate.

## 2023-02-18 ENCOUNTER — Encounter: Payer: Self-pay | Admitting: Family Medicine

## 2023-02-19 ENCOUNTER — Other Ambulatory Visit (HOSPITAL_COMMUNITY): Payer: Self-pay

## 2023-02-21 ENCOUNTER — Other Ambulatory Visit: Payer: Self-pay | Admitting: Family Medicine

## 2023-02-21 DIAGNOSIS — F418 Other specified anxiety disorders: Secondary | ICD-10-CM

## 2023-02-22 MED ORDER — ESCITALOPRAM OXALATE 10 MG PO TABS
10.0000 mg | ORAL_TABLET | Freq: Every day | ORAL | 0 refills | Status: DC
Start: 2023-02-22 — End: 2023-03-24

## 2023-02-23 ENCOUNTER — Ambulatory Visit: Payer: Managed Care, Other (non HMO) | Admitting: Family Medicine

## 2023-02-26 ENCOUNTER — Other Ambulatory Visit (HOSPITAL_COMMUNITY): Payer: Self-pay

## 2023-02-26 NOTE — Progress Notes (Unsigned)
Office Visit Note  Patient: Kathy Clark             Date of Birth: Nov 23, 1960           MRN: [id number]             PCP: Agapito Games, MD Referring: Agapito Games, * Visit Date: 03/12/2023   Subjective:  Follow-up (Doing good )   History of Present Illness: Kathy Clark is a 62 y.o. 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  She is doing well no major flareup of arthritis symptoms.  She unfortunately got sick with COVID in July although did not have severe disease course.  This was complicated by a sinus infection following her initial COVID this was treated with a course of oral antibiotics.  She had to discontinue Rinvoq due to COVID and was not able to resume until after finishing the antibiotics but is back on treatment now.  About 4 days diagnosed with COVID and holding medicine she broke out in intensely itchy rashes throughout multiple areas on her trunk and on both arms.  These were itchy before they became visible rashes subsequently and erythema raised bumps and some excoriations.  Does not have any previous history of eczema or chronic skin rashes.  These subsequently cleared up.  Previous HPI 12/11/2022 Kathy Clark is a 62 y.o. 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  Since her last visit she had left foot surgery as planned which went uneventfully.  She did not experience any flareup of symptoms with perioperative medication interruption.  Has been noticing ongoing pain in the right hip that is doing somewhat worse while she is nonweightbearing with a rigid boot.  She will complete 1 month nonweightbearing in another week.  She took prophylactic antibiotics for surgery but no other infection or antibiotics treatment required.   Previous HPI 09/29/22 Kathy Clark is a 62 y.o. female here to establish care for seronegative RA, on rinvoq needing to transfer care due to change in insurance. Initial  diagnosis in 2021 for evaluation of increased joint pain with peripheral synovitis affecting hands.  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  Lumbar degenerative disc disease with previous L5-S1 discectomy.  However she was experiencing increasing symptoms especially with visible swelling involving the MCP joints on both hands.  Also developed increased pain especially in the toes and forefeet on both sides.  Previous treatments include methotrexate, hydroxychloroquine, Enbrel, and prednisone.  Did not tolerate the small molecule medications due to significant side effects.  Enbrel monotherapy was not highly effective though she originally had some response to the combination with methotrexate.  So far was doing well on rinvoq, but she did have to discontinue medicine for a few weeks due to COVID infection and subsequent sinus infection treated with antibiotics.  Noticed increase in symptoms with treatment interruption.  She cannot tolerate even low-dose NSAIDs due to lymphocytic colitis associated diarrhea.  Occasionally takes low-dose prednisone 5 mg for breakthrough symptoms but not on any daily dose.   DMARD Hx Enbrel MTX HCQ   09/11/22 CMP unremarkable   11/2019 HBV neg HCV neg   09/2019 CRP 14.9 ANA neg RF neg CCP neg ESR 22   Review of Systems  Constitutional:  Negative  for fatigue.  HENT:  Negative for mouth sores and mouth dryness.   Eyes:  Positive for dryness.  Respiratory:  Negative for shortness of breath.   Cardiovascular:  Negative for chest pain and palpitations.  Gastrointestinal:  Negative for blood in stool, constipation and diarrhea.  Endocrine: Negative for increased urination.  Genitourinary:  Negative for involuntary urination.  Musculoskeletal:  Positive for joint pain, gait  problem, joint pain, joint swelling, myalgias, muscle weakness, morning stiffness, muscle tenderness and myalgias.  Skin:  Negative for color change, rash, hair loss and sensitivity to sunlight.  Allergic/Immunologic: Positive for susceptible to infections.  Neurological:  Negative for dizziness and headaches.  Hematological:  Negative for swollen glands.  Psychiatric/Behavioral:  Positive for sleep disturbance. Negative for depressed mood. The patient is nervous/anxious.     PMFS History:  Patient Active Problem List   Diagnosis Date Noted   Seronegative rheumatoid arthritis (HCC) 09/29/2022   High risk medication use 09/29/2022   Inattention 01/09/2022   Other fatigue 08/29/2021   Indication present for endocarditis prophylaxis 05/07/2020   PVC (premature ventricular contraction) 05/07/2020   Major depressive disorder with single episode, in partial remission (HCC) 05/07/2020   Bursitis of left shoulder 02/08/2020   Encounter for weight management 01/10/2020   Sleeping difficulties 01/10/2020   Rheumatoid arthritis of multiple sites with negative rheumatoid factor (HCC) 12/04/2019   Polyarthralgia 11/02/2019   Myofascial pain 11/02/2019   Arthralgia of both hands 10/03/2019   Metatarsalgia of left foot 01/05/2018   Other hyperlipidemia 03/23/2017   OSA (obstructive sleep apnea) 02/25/2017   Class 2 obesity due to excess calories with body mass index (BMI) of 38.0 to 38.9 in adult 08/17/2016   Vitamin D deficiency 08/03/2016   Anxious depression 02/20/2016   Left carpal tunnel syndrome, post right carpal tunnel release 12/24/2014   Trochanteric bursitis of right hip 04/21/2013   Postop Mild Hyponatremia 07/30/2011   OSTEOARTHRITIS, KNEE, LEFT 12/12/2009   Migraine 05/31/2008   SKIN CANCER, HX OF 01/06/2008   COLITIS, HX OF 01/06/2008   ALLERGIC RHINITIS 05/20/2007   HYPERTENSION, BENIGN ESSENTIAL 11/23/2006   SCIATICA, CHRONIC 05/19/2006    Past Medical History:  Diagnosis  Date   Anxiety    Back pain    Complication of anesthesia    allergy to Succinylcholine   Depression    Essential hypertension, benign    Gastritis    GERD (gastroesophageal reflux disease)    Joint pain    Lymphocytic colitis    microscopic   Migraines    Osteoarthritis    Sciatica    right leg   Swelling    feet, left foot    Family History  Problem Relation Age of Onset   Tongue cancer Mother    Breast cancer Mother    Anxiety disorder Mother    Depression Mother    Heart disease Father    Hypertension Father    Hyperlipidemia Father    Atrial fibrillation Brother    Hypertension Brother    Stroke Paternal Grandmother    Breast cancer Other    Colon cancer Neg Hx    Past Surgical History:  Procedure Laterality Date   ABDOMINAL HYSTERECTOMY  07/2005   [place] Bunionectomy Left 09/18/2016   Left Foot   BACK SURGERY     BUNIONECTOMY Left 11/03/2022   HAMMER TOE SURGERY Left 11/03/2022   KNEE SURGERY  2006   left; multiple   microdiscetomy  2005   SHOULDER SURGERY  07/22/2009  right   SKIN CANCER EXCISION     eyelid   TOTAL ABDOMINAL HYSTERECTOMY     TOTAL KNEE ARTHROPLASTY  07/27/2011   Procedure: TOTAL KNEE ARTHROPLASTY;  Surgeon: Loanne Drilling;  Location: [facility];  Service: Orthopedics;  Laterality: Left;   Social History   Social History Narrative   Not on file   Immunization History  Administered Date(s) Administered   COVID-19, mRNA, vaccine(Comirnaty)12 years and older 04/30/2022   Influenza Whole 04/12/2006   Influenza,inj,Quad PF,6+ Mos 05/29/2013, 03/12/2015, 03/04/2018, 05/03/2019, 05/07/2020, 05/02/2021, 04/30/2022   Influenza-Unspecified 04/13/2017   PFIZER(Purple Top)SARS-COV-2 Vaccination 07/22/2019, 08/10/2019, 06/17/2020   Tdap 03/12/2015     Objective: Vital Signs: BP 136/87 (BP Location: Right Arm, Patient Position: Sitting, Cuff Size: Normal)   Pulse 73   Resp 15   Ht 5\' 8"  (1.727 m)   Wt 227 lb (103 kg)   LMP  (LMP  Unknown) Comment: hystrectomy  BMI 34.52 kg/m    Physical Exam Constitutional:      Appearance: She is obese.  Eyes:     Conjunctiva/sclera: Conjunctivae normal.  Cardiovascular:     Rate and Rhythm: Normal rate and regular rhythm.  Pulmonary:     Effort: Pulmonary effort is normal.     Breath sounds: Normal breath sounds.  Lymphadenopathy:     Cervical: No cervical adenopathy.  Skin:    General: Skin is warm and dry.     Findings: No rash.  Neurological:     Mental Status: She is alert.  Psychiatric:        Mood and Affect: Mood normal.      Musculoskeletal Exam:  Neck full ROM no tenderness Shoulders full ROM no tenderness or swelling Elbows full ROM no tenderness or swelling Wrists full ROM no tenderness or swelling Fingers full ROM no tenderness or swelling Lateral tenderness to pressure at the right hip no pain provoked with internal rotation, left hip normal Knees full ROM no tenderness or swelling  Investigation: No additional findings.  Imaging: No results found.  Recent Labs: Lab Results  Component Value Date   WBC 6.8 02/16/2023   HGB 13.8 02/16/2023   PLT 311 02/16/2023   NA 136 03/12/2023   K 4.0 03/12/2023   CL 99 03/12/2023   CO2 26 03/12/2023   GLUCOSE 88 03/12/2023   BUN 17 03/12/2023   CREATININE 0.64 03/12/2023   BILITOT 0.5 03/12/2023   ALKPHOS 66 11/08/2019   AST 18 03/12/2023   ALT 24 03/12/2023   PROT 7.1 03/12/2023   ALBUMIN 4.3 11/08/2019   CALCIUM 10.0 03/12/2023   GFRAA 111 01/08/2021    Speciality Comments: Negative QuantiFERON test for health at work drawn through [employer] November 2023.  Procedures:  No procedures performed Allergies: Succinylcholine, Dilaudid [hydromorphone hcl], Nsaids, Phentermine, Plaquenil [hydroxychloroquine], and Topamax [topiramate]   Assessment / Plan:     Visit Diagnoses: Rheumatoid arthritis of multiple sites with negative rheumatoid factor (HCC) - Plan: Sedimentation rate, C-reactive  protein  Checking sed rate and CRP for inflammatory disease activity monitoring.  Overall looks be doing well has not had a major flareup of arthritis despite interruption of her usual regimen and the COVID infection.  Plan to continue Rinvoq 15 mg daily.  High risk medication use - Rinvoq 15 mg p.o. daily. - Plan: COMPLETE METABOLIC PANEL WITH GFR  Checking CMP for medication monitoring on Rinvoq.  She recently had a complete blood count checked in her PCP office earlier this month was fine.  Discussed infection risk on long-term treatment with Rinvoq I am not too certain about that data for COVID booster vaccine series given her recent infection. Age of 40 does not have other high risk factor besides being on this medication and obesity.  I did strongly recommend the Shingrix vaccine and annual flu vaccine.  Trochanteric bursitis of right hip  There is mild chronically persistent lateral hip pain not in exacerbation.  No appreciable swelling no range of motion limitations.  Orders: Orders Placed This Encounter  Procedures   Sedimentation rate   C-reactive protein   COMPLETE METABOLIC PANEL WITH GFR   No orders of the defined types were placed in this encounter.    Follow-Up Instructions: Return in about 3 months (around 06/12/2023) for RA on UPA f/u 3mos.   Fuller Plan, MD  Note - This record has been created using [employer].  Chart creation errors have been sought, but may not always  have been located. Such creation errors do not reflect on  the standard of medical care.

## 2023-03-12 ENCOUNTER — Encounter: Payer: Self-pay | Admitting: Internal Medicine

## 2023-03-12 ENCOUNTER — Ambulatory Visit: Payer: Managed Care, Other (non HMO) | Attending: Internal Medicine | Admitting: Internal Medicine

## 2023-03-12 VITALS — BP 136/87 | HR 73 | Resp 15 | Ht 68.0 in | Wt 227.0 lb

## 2023-03-12 DIAGNOSIS — M7061 Trochanteric bursitis, right hip: Secondary | ICD-10-CM | POA: Diagnosis not present

## 2023-03-12 DIAGNOSIS — Z79899 Other long term (current) drug therapy: Secondary | ICD-10-CM | POA: Diagnosis not present

## 2023-03-12 DIAGNOSIS — M0609 Rheumatoid arthritis without rheumatoid factor, multiple sites: Secondary | ICD-10-CM | POA: Diagnosis not present

## 2023-03-13 LAB — COMPLETE METABOLIC PANEL WITH GFR
AG Ratio: 1.7 (calc) (ref 1.0–2.5)
ALT: 24 U/L (ref 6–29)
AST: 18 U/L (ref 10–35)
Albumin: 4.5 g/dL (ref 3.6–5.1)
Alkaline phosphatase (APISO): 38 U/L (ref 37–153)
BUN: 17 mg/dL (ref 7–25)
CO2: 26 mmol/L (ref 20–32)
Calcium: 10 mg/dL (ref 8.6–10.4)
Chloride: 99 mmol/L (ref 98–110)
Creat: 0.64 mg/dL (ref 0.50–1.05)
Globulin: 2.6 g/dL (ref 1.9–3.7)
Glucose, Bld: 88 mg/dL (ref 65–99)
Potassium: 4 mmol/L (ref 3.5–5.3)
Sodium: 136 mmol/L (ref 135–146)
Total Bilirubin: 0.5 mg/dL (ref 0.2–1.2)
Total Protein: 7.1 g/dL (ref 6.1–8.1)
eGFR: 100 mL/min/{1.73_m2} (ref >=60)

## 2023-03-13 LAB — SEDIMENTATION RATE: Sed Rate: 2 mm/h (ref 0–30)

## 2023-03-13 LAB — C-REACTIVE PROTEIN: CRP: <3 mg/L (ref <8.0)

## 2023-03-18 ENCOUNTER — Other Ambulatory Visit (HOSPITAL_COMMUNITY): Payer: Self-pay

## 2023-03-22 ENCOUNTER — Other Ambulatory Visit (HOSPITAL_COMMUNITY): Payer: Self-pay

## 2023-03-22 ENCOUNTER — Other Ambulatory Visit: Payer: Self-pay

## 2023-03-22 ENCOUNTER — Other Ambulatory Visit: Payer: Self-pay | Admitting: Internal Medicine

## 2023-03-22 MED ORDER — RINVOQ 15 MG PO TB24
ORAL_TABLET | ORAL | 0 refills | Status: DC
  Filled 2023-03-22: qty 30, 30d supply, fill #0
  Filled 2023-04-23: qty 30, 30d supply, fill #1
  Filled 2023-05-20: qty 30, 30d supply, fill #2

## 2023-03-22 NOTE — Telephone Encounter (Signed)
Last Fill: 12/10/2022  Labs: 03/12/2023 CMP normal  02/16/2023 CBC normal  09/11/2022 Lipid Panel  Cholesterol 219 LDL 135 Non-HDL Cholesterol 156  TB Gold: 05/19/2022 negative ([facility] Tab)  Next Visit: 06/14/2023  Last Visit: 03/12/2023  DX: Rheumatoid arthritis of multiple sites with negative rheumatoid factor   Current Dose per office note 03/12/2023: Rinvoq 15 mg daily   Okay to refill Rinvoq?

## 2023-03-24 ENCOUNTER — Other Ambulatory Visit: Payer: Self-pay | Admitting: Family Medicine

## 2023-03-24 DIAGNOSIS — F418 Other specified anxiety disorders: Secondary | ICD-10-CM

## 2023-03-26 MED ORDER — ESCITALOPRAM OXALATE 10 MG PO TABS
10.0000 mg | ORAL_TABLET | Freq: Every day | ORAL | 0 refills | Status: DC
Start: 2023-03-26 — End: 2023-04-30

## 2023-04-19 ENCOUNTER — Ambulatory Visit: Payer: Managed Care, Other (non HMO) | Admitting: Family Medicine

## 2023-04-23 ENCOUNTER — Other Ambulatory Visit (HOSPITAL_COMMUNITY): Payer: Self-pay

## 2023-04-23 ENCOUNTER — Other Ambulatory Visit: Payer: Self-pay

## 2023-04-23 NOTE — Progress Notes (Signed)
Clinical Intervention Note  Clinical Intervention Notes: Patient requested additional information on receiving flu/COVID/shingles vaccines while taking Rinvoq. Advised that it is safe to receive them and she may wish to separate them by a few weeks in case she feels poorly afterward.   Clinical Intervention Outcomes: Prevention of an adverse drug event   Otto Herb Specialty Pharmacist

## 2023-04-23 NOTE — Progress Notes (Signed)
Specialty Pharmacy Refill Coordination Note  Kathy Clark is a 62 y.o. female contacted today regarding refills of specialty medication(s) Upadacitinib   Patient requested Delivery   Delivery date: 04/29/23   Verified address: [address], [place], [phone]   Medication will be filled on 04/28/23.

## 2023-04-23 NOTE — Progress Notes (Signed)
Specialty Pharmacy Ongoing Clinical Assessment Note  Kathy Clark is a 62 y.o. female who is being followed by the specialty pharmacy service for RxSp Rheumatoid Arthritis   Patient's specialty medication(s) reviewed today: Upadacitinib   Missed doses in the last 4 weeks: 0   Patient/Caregiver did not have any additional questions or concerns.   Therapeutic benefit summary: Patient is achieving benefit   Adverse events/side effects summary: No adverse events/side effects   Patient's therapy is appropriate to: Continue    Goals Addressed             This Visit's Progress    Reduce signs and symptoms       Patient is on track. Patient will maintain adherence. Patient reports feeling great since she started treatment. She is very satisfied with the relief she has seen.          Follow up:  6 months  Otto Herb Specialty Pharmacist

## 2023-04-30 ENCOUNTER — Other Ambulatory Visit: Payer: Self-pay | Admitting: Family Medicine

## 2023-04-30 ENCOUNTER — Ambulatory Visit (INDEPENDENT_AMBULATORY_CARE_PROVIDER_SITE_OTHER): Payer: Managed Care, Other (non HMO)

## 2023-04-30 DIAGNOSIS — Z23 Encounter for immunization: Secondary | ICD-10-CM | POA: Diagnosis not present

## 2023-04-30 DIAGNOSIS — F418 Other specified anxiety disorders: Secondary | ICD-10-CM

## 2023-04-30 DIAGNOSIS — R5383 Other fatigue: Secondary | ICD-10-CM

## 2023-04-30 DIAGNOSIS — R41840 Attention and concentration deficit: Secondary | ICD-10-CM

## 2023-04-30 MED ORDER — ESCITALOPRAM OXALATE 10 MG PO TABS
10.0000 mg | ORAL_TABLET | Freq: Every day | ORAL | 1 refills | Status: DC
Start: 2023-04-30 — End: 2023-05-06

## 2023-04-30 MED ORDER — LISDEXAMFETAMINE DIMESYLATE 40 MG PO CAPS
40.0000 mg | ORAL_CAPSULE | Freq: Every day | ORAL | 0 refills | Status: DC
Start: 2023-04-30 — End: 2023-07-24

## 2023-05-05 ENCOUNTER — Encounter: Payer: Self-pay | Admitting: Family Medicine

## 2023-05-05 DIAGNOSIS — F418 Other specified anxiety disorders: Secondary | ICD-10-CM

## 2023-05-06 ENCOUNTER — Other Ambulatory Visit: Payer: Self-pay | Admitting: Family Medicine

## 2023-05-06 DIAGNOSIS — F418 Other specified anxiety disorders: Secondary | ICD-10-CM

## 2023-05-06 MED ORDER — ESCITALOPRAM OXALATE 10 MG PO TABS
10.0000 mg | ORAL_TABLET | Freq: Every day | ORAL | 0 refills | Status: DC
Start: 2023-05-06 — End: 2023-06-22

## 2023-05-06 NOTE — Telephone Encounter (Signed)
Patient informed. 

## 2023-05-06 NOTE — Telephone Encounter (Signed)
Patient requesting #5 tablets of lexapro until full script received by mail order

## 2023-05-12 ENCOUNTER — Ambulatory Visit (INDEPENDENT_AMBULATORY_CARE_PROVIDER_SITE_OTHER): Payer: Managed Care, Other (non HMO) | Admitting: Family Medicine

## 2023-05-12 ENCOUNTER — Encounter: Payer: Self-pay | Admitting: Internal Medicine

## 2023-05-12 VITALS — BP 124/70 | HR 71 | Ht 68.0 in | Wt 234.0 lb

## 2023-05-12 DIAGNOSIS — Z7689 Persons encountering health services in other specified circumstances: Secondary | ICD-10-CM

## 2023-05-12 DIAGNOSIS — Z23 Encounter for immunization: Secondary | ICD-10-CM | POA: Diagnosis not present

## 2023-05-12 DIAGNOSIS — R7309 Other abnormal glucose: Secondary | ICD-10-CM

## 2023-05-12 DIAGNOSIS — F324 Major depressive disorder, single episode, in partial remission: Secondary | ICD-10-CM | POA: Diagnosis not present

## 2023-05-12 LAB — POCT GLYCOSYLATED HEMOGLOBIN (HGB A1C): Hemoglobin A1C: 5 % (ref 4.0–5.6)

## 2023-05-12 MED ORDER — BUPROPION HCL ER (XL) 150 MG PO TB24: 150.0000 mg | ORAL_TABLET | Freq: Every day | ORAL | 0 refills | Status: DC

## 2023-05-12 NOTE — Progress Notes (Signed)
Established Patient Office Visit  Subjective   Patient ID: Kathy Clark, female    DOB: August 26, 1960  Age: 62 y.o. MRN: [id number]  Chief Complaint  Patient presents with   Medical Management of Chronic Issues    HPI  F/U MDD -here for follow-up of new start Lexapro about 12 weeks ago. .  She is previously tried fluoxetine, trazodone, and now Lexapro.  She is just not convinced the Lexapro is actually been really helpful for her.  Brain Fog -she feels like that is gotten much better since coming off of the Topamax it was definitely causing some symptoms.  She would still like to discuss weight management.  Current BMI is 35.  Her current insurance plan will not pay for any of the GLP-1 so she is getting ready to sign up for her new plan for January.    ROS    Objective:     BP 124/70   Pulse 71   Ht 5\' 8"  (1.727 m)   Wt 234 lb (106.1 kg)   LMP  (LMP Unknown) Comment: hystrectomy  SpO2 100%   BMI 35.58 kg/m    Physical Exam Vitals and nursing note reviewed.  Constitutional:      Appearance: Normal appearance.  HENT:     Head: Normocephalic and atraumatic.  Eyes:     Conjunctiva/sclera: Conjunctivae normal.  Cardiovascular:     Rate and Rhythm: Normal rate and regular rhythm.  Pulmonary:     Effort: Pulmonary effort is normal.     Breath sounds: Normal breath sounds.  Skin:    General: Skin is warm and dry.  Neurological:     Mental Status: She is alert.  Psychiatric:        Mood and Affect: Mood normal.      Results for orders placed or performed in visit on 05/12/23  POCT HgB A1C  Result Value Ref Range   Hemoglobin A1C 5.0 4.0 - 5.6 %   HbA1c POC (<> result, manual entry)     HbA1c, POC (prediabetic range)     HbA1c, POC (controlled diabetic range)        The 10-year ASCVD risk score (Arnett DK, et al., 2019) is: 5.6%    Assessment & Plan:   Problem List Items Addressed This Visit       Other   Major depressive disorder with single  episode, in partial remission (HCC) - Primary    He took Wellbutrin years ago and is open to trying it again I think it could help with appetite as well as help with mood potentially and not cause any sexual side effects.  Will start with bupropion 100 Filho 50 mg daily.  New prescription sent to pharmacy.      Relevant Medications   buPROPion (WELLBUTRIN XL) 150 MG 24 hr tablet   Encounter for weight management    Topamax was not tolerated well.  Contrave is an option but it was a little expensive but it is covered by her current insurance plan.  We discussed maybe starting with bupropion which is one of the main ingredients in Contrave to see if this is helpful.  We could always add a low-dose of naltrexone if needed to see if that is beneficial as well.  Unfortunately since coming off the GLP-1 her weight has been gradually going upward even though she has been really trying to manage it.      Other Visit Diagnoses     Abnormal glucose  Relevant Orders   POCT HgB A1C (Completed)       Return in about 2 months (around 07/12/2023) for Mood.    Nani Gasser, MD

## 2023-05-12 NOTE — Assessment & Plan Note (Signed)
Topamax was not tolerated well.  Contrave is an option but it was a little expensive but it is covered by her current insurance plan.  We discussed maybe starting with bupropion which is one of the main ingredients in Contrave to see if this is helpful.  We could always add a low-dose of naltrexone if needed to see if that is beneficial as well.  Unfortunately since coming off the GLP-1 her weight has been gradually going upward even though she has been really trying to manage it.

## 2023-05-12 NOTE — Progress Notes (Signed)
Awesome A1C!!!!! No diabetes or prediates

## 2023-05-12 NOTE — Patient Instructions (Signed)
Cut lexapro in half and take half tab daily for 8 days and then 1/2 tab every other day for 6 days and then stop.

## 2023-05-12 NOTE — Assessment & Plan Note (Signed)
He took Wellbutrin years ago and is open to trying it again I think it could help with appetite as well as help with mood potentially and not cause any sexual side effects.  Will start with bupropion 100 Filho 50 mg daily.  New prescription sent to pharmacy.

## 2023-05-20 ENCOUNTER — Other Ambulatory Visit (HOSPITAL_COMMUNITY): Payer: Self-pay | Admitting: Pharmacy Technician

## 2023-05-20 ENCOUNTER — Other Ambulatory Visit (HOSPITAL_COMMUNITY): Payer: Self-pay

## 2023-05-20 NOTE — Progress Notes (Signed)
Specialty Pharmacy Refill Coordination Note  MYSTERY SCHRUPP is a 62 y.o. female contacted today regarding refills of specialty medication(s) Upadacitinib   Patient requested Delivery   Delivery date: 06/01/23   Verified address: [address]  [place]   Medication will be filled on 05/31/23.

## 2023-05-24 ENCOUNTER — Telehealth: Payer: Self-pay | Admitting: Internal Medicine

## 2023-05-24 ENCOUNTER — Encounter: Payer: Self-pay | Admitting: Family Medicine

## 2023-05-24 MED ORDER — METHOCARBAMOL 500 MG PO TABS: ORAL_TABLET | ORAL | 2 refills | Status: DC

## 2023-05-24 NOTE — Telephone Encounter (Signed)
Patient left a voicemail (05/21/23 at 5:21 pm) stating she messaged Dr. Dimple Casey over a week ago about trying a muscle relaxer (not Flexeril something less sedating) for her joint pain.  Patient states she never heard back from anybody.  Patient states she left a message on the nurse line 2 days ago and still hasn't received a return call. Patient states she is having difficulty sleeping due to her joint pain and requested a prescription be sent to [facility] at [facility] in [place].

## 2023-05-24 NOTE — Telephone Encounter (Signed)
Patient is currently prescribed Rinvoq. Patient would like something to help the pain but does not want flexeril. Please advise.

## 2023-05-25 ENCOUNTER — Ambulatory Visit: Payer: Managed Care, Other (non HMO) | Admitting: Family Medicine

## 2023-05-25 ENCOUNTER — Encounter: Payer: Self-pay | Admitting: Family Medicine

## 2023-05-25 VITALS — BP 129/71 | HR 98 | Ht 68.0 in | Wt 234.0 lb

## 2023-05-25 DIAGNOSIS — M76892 Other specified enthesopathies of left lower limb, excluding foot: Secondary | ICD-10-CM | POA: Diagnosis not present

## 2023-05-25 DIAGNOSIS — F324 Major depressive disorder, single episode, in partial remission: Secondary | ICD-10-CM | POA: Diagnosis not present

## 2023-05-25 MED ORDER — PREDNISONE 20 MG PO TABS: 40.0000 mg | ORAL_TABLET | Freq: Every day | ORAL | 0 refills | Status: DC

## 2023-05-25 NOTE — Progress Notes (Signed)
   Acute Office Visit  Subjective:     Patient ID: Kathy Clark, female    DOB: [birth year], 62 y.o.   MRN: [id number]  Chief Complaint  Patient presents with   Hip Pain    L hip she reports that this started 2 weeks ago however her pain got worse 2 days ago. She stated that the pain is more towards the front of her groin -hip flexor. She said the pain is sharp,shooting. She has tried heat and soaking     HPI Patient is in today for Left hip pain towards hip flexor. Started 2 weeks ago and worse last 2 days.  Painful to raise leg.  Tried heat, hot soaks - no relief. Sent in a muscle relaxer.  Was able to get some rest last night.  It is painful just to lift that leg up to get in and out of the car.  The pain at times is sharp and shooting.  ROS      Objective:    BP 129/71   Pulse 98   Ht 5\' 8"  (1.727 m)   Wt 234 lb (106.1 kg)   LMP  (LMP Unknown) Comment: hystrectomy  SpO2 98%   BMI 35.58 kg/m    Physical Exam Musculoskeletal:     Comments: Groin crease is tender in mid crease area on the left side.   nontender directly over the left greater trochanter.  She had pain with straight leg raise over the hip crease.  No significant discomfort with internal and external rotation.     No results found for any visits on 05/25/23.      Assessment & Plan:   Problem List Items Addressed This Visit       Other   Major depressive disorder with single episode, in partial remission (HCC) - Primary   Other Visit Diagnoses     Tendinitis of left hip flexor          Left hip flexor tendinitis-handout given on home exercises to do.  She cannot take NSAIDs so we did discuss doing a round of prednisone as the Tylenol does not seem to be helping.  Recommend ice for 5 to 10 minutes 3 times a day.  If not improving over the next couple of weeks then we will get her in with Dr. Benjamin Stain for further workup.   Meds ordered this encounter  Medications   predniSONE (DELTASONE) 20  MG tablet    Sig: Take 2 tablets (40 mg total) by mouth daily with breakfast.    Dispense:  10 tablet    Refill:  0    Return in about 4 weeks (around 06/22/2023) for Mood.  Nani Gasser, MD

## 2023-05-31 NOTE — Progress Notes (Unsigned)
Office Visit Note  Patient: Kathy Clark             Date of Birth: Mar 06, 1961           MRN: [id number]             PCP: Agapito Games, MD Referring: Agapito Games, * Visit Date: 06/14/2023   Subjective:  No chief complaint on file.   History of Present Illness: Kathy Clark is a 62 y.o. 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    Previous HPI 03/12/2023 Kathy Clark is a 62 y.o. 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  She is doing well no major flareup of arthritis symptoms.  She unfortunately got sick with COVID in July although did not have severe disease course.  This was complicated by a sinus infection following her initial COVID this was treated with a course of oral antibiotics.  She had to discontinue Rinvoq due to COVID and was not able to resume until after finishing the antibiotics but is back on treatment now.  About 4 days diagnosed with COVID and holding medicine she broke out in intensely itchy rashes throughout multiple areas on her trunk and on both arms.  These were itchy before they became visible rashes subsequently and erythema raised bumps and some excoriations.  Does not have any previous history of eczema or chronic skin rashes.  These subsequently cleared up.   Previous HPI 12/11/2022 Kathy Clark is a 62 y.o. 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  Since her last visit she had left foot surgery as planned which went uneventfully.  She did not experience any flareup of symptoms with perioperative medication interruption.  Has been noticing ongoing pain in the right hip that is doing somewhat worse while she is nonweightbearing with a rigid boot.  She will complete 1 month nonweightbearing in another week.  She took prophylactic antibiotics for surgery but no other infection or antibiotics treatment required.   Previous HPI 09/29/22 Kathy Clark is a 62  y.o. female here to establish care for seronegative RA, on rinvoq needing to transfer care due to change in insurance. Initial diagnosis in 2021 for evaluation of increased joint pain with peripheral synovitis affecting hands.  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  Lumbar degenerative disc disease with previous L5-S1 discectomy.  However she was experiencing increasing symptoms especially with visible swelling involving the MCP joints on both hands.  Also developed increased pain especially in the toes and forefeet on both sides.  Previous treatments include methotrexate, hydroxychloroquine, Enbrel, and prednisone.  Did not tolerate the small molecule medications due to significant side effects.  Enbrel monotherapy was not highly effective though she originally had some response to the combination with methotrexate.  So far was doing well on rinvoq, but she did have to discontinue medicine for a few weeks due to COVID infection and subsequent sinus infection treated with antibiotics.  Noticed increase in symptoms with treatment interruption.  She cannot tolerate even low-dose NSAIDs due to lymphocytic colitis associated diarrhea.  Occasionally takes low-dose prednisone 5 mg for breakthrough symptoms but not on any daily dose.   DMARD Hx Enbrel MTX HCQ   09/11/22 CMP unremarkable  11/2019 HBV neg HCV neg   09/2019 CRP 14.9 ANA neg RF neg CCP neg ESR 22     No Rheumatology ROS completed.   PMFS History:  Patient Active Problem List   Diagnosis Date Noted   Seronegative rheumatoid arthritis (HCC) 09/29/2022   High risk medication use 09/29/2022   Inattention 01/09/2022   Other fatigue 08/29/2021   Indication present for endocarditis prophylaxis 05/07/2020   PVC (premature ventricular contraction)  05/07/2020   Major depressive disorder with single episode, in partial remission (HCC) 05/07/2020   Bursitis of left shoulder 02/08/2020   Encounter for weight management 01/10/2020   Sleeping difficulties 01/10/2020   Rheumatoid arthritis of multiple sites with negative rheumatoid factor (HCC) 12/04/2019   Polyarthralgia 11/02/2019   Myofascial pain 11/02/2019   Arthralgia of both hands 10/03/2019   Metatarsalgia of left foot 01/05/2018   Other hyperlipidemia 03/23/2017   OSA (obstructive sleep apnea) 02/25/2017   Class 2 obesity due to excess calories with body mass index (BMI) of 38.0 to 38.9 in adult 08/17/2016   Vitamin D deficiency 08/03/2016   Anxious depression 02/20/2016   Left carpal tunnel syndrome, post right carpal tunnel release 12/24/2014   Trochanteric bursitis of right hip 04/21/2013   OSTEOARTHRITIS, KNEE, LEFT 12/12/2009   Migraine 05/31/2008   SKIN CANCER, HX OF 01/06/2008   COLITIS, HX OF 01/06/2008   Allergic rhinitis 05/20/2007   HYPERTENSION, BENIGN ESSENTIAL 11/23/2006   SCIATICA, CHRONIC 05/19/2006    Past Medical History:  Diagnosis Date   Anxiety    Back pain    Complication of anesthesia    allergy to Succinylcholine   Depression    Essential hypertension, benign    Gastritis    GERD (gastroesophageal reflux disease)    Joint pain    Lymphocytic colitis    microscopic   Migraines    Osteoarthritis    Sciatica    right leg   Swelling    feet, left foot    Family History  Problem Relation Age of Onset   Tongue cancer Mother    Breast cancer Mother    Anxiety disorder Mother    Depression Mother    Heart disease Father    Hypertension Father    Hyperlipidemia Father    Atrial fibrillation Brother    Hypertension Brother    Stroke Paternal Grandmother    Breast cancer Other    Colon cancer Neg Hx    Past Surgical History:  Procedure Laterality Date   ABDOMINAL HYSTERECTOMY  07/2005   [place] Bunionectomy Left 09/18/2016   Left  Foot   BACK SURGERY     BUNIONECTOMY Left 11/03/2022   HAMMER TOE SURGERY Left 11/03/2022   KNEE SURGERY  2006   left; multiple   microdiscetomy  2005   SHOULDER SURGERY  07/22/2009   right   SKIN CANCER EXCISION     eyelid   TOTAL ABDOMINAL HYSTERECTOMY     TOTAL KNEE ARTHROPLASTY  07/27/2011   Procedure: TOTAL KNEE ARTHROPLASTY;  Surgeon: Gus Rankin Aluisio;  Location: [facility];  Service: Orthopedics;  Laterality: Left;   Social History   Social History Narrative   Not on file   Immunization History  Administered Date(s) Administered   Influenza Whole 04/12/2006   Influenza, Seasonal, Injecte, Preservative Fre 04/30/2023   Influenza,inj,Quad PF,6+ Mos 05/29/2013, 03/12/2015, 03/04/2018, 05/03/2019, 05/07/2020, 05/02/2021, 04/30/2022   Influenza-Unspecified 04/13/2017   PFIZER(Purple Top)SARS-COV-2 Vaccination 07/22/2019, 08/10/2019, 06/17/2020   Pfizer(Comirnaty)Fall Seasonal Vaccine 12 years and older 04/30/2022,  04/30/2023   Tdap 03/12/2015   Zoster Recombinant(Shingrix) 05/12/2023     Objective: Vital Signs: LMP  (LMP Unknown) Comment: hystrectomy   Physical Exam   Musculoskeletal Exam: ***  CDAI Exam: CDAI Score: -- Patient Global: --; Provider Global: -- Swollen: --; Tender: -- Joint Exam 06/14/2023   No joint exam has been documented for this visit   There is currently no information documented on the homunculus. Go to the Rheumatology activity and complete the homunculus joint exam.  Investigation: No additional findings.  Imaging: No results found.  Recent Labs: Lab Results  Component Value Date   WBC 6.8 02/16/2023   HGB 13.8 02/16/2023   PLT 311 02/16/2023   NA 136 03/12/2023   K 4.0 03/12/2023   CL 99 03/12/2023   CO2 26 03/12/2023   GLUCOSE 88 03/12/2023   BUN 17 03/12/2023   CREATININE 0.64 03/12/2023   BILITOT 0.5 03/12/2023   ALKPHOS 66 11/08/2019   AST 18 03/12/2023   ALT 24 03/12/2023   PROT 7.1 03/12/2023   ALBUMIN 4.3 11/08/2019    CALCIUM 10.0 03/12/2023   GFRAA 111 01/08/2021    Speciality Comments: Negative QuantiFERON test for health at work drawn through [employer] November 2023.  Procedures:  No procedures performed Allergies: Succinylcholine, Dilaudid [hydromorphone hcl], Nsaids, Phentermine, Plaquenil [hydroxychloroquine], and Topamax [topiramate]   Assessment / Plan:     Visit Diagnoses: No diagnosis found.  ***  Orders: No orders of the defined types were placed in this encounter.  No orders of the defined types were placed in this encounter.    Follow-Up Instructions: No follow-ups on file.   Metta Clines, RT  Note - This record has been created using [employer].  Chart creation errors have been sought, but may not always  have been located. Such creation errors do not reflect on  the standard of medical care.

## 2023-06-14 ENCOUNTER — Ambulatory Visit: Payer: Managed Care, Other (non HMO) | Admitting: Internal Medicine

## 2023-06-14 ENCOUNTER — Other Ambulatory Visit: Payer: Self-pay | Admitting: Internal Medicine

## 2023-06-14 ENCOUNTER — Encounter: Payer: Self-pay | Admitting: Family Medicine

## 2023-06-14 ENCOUNTER — Telehealth: Payer: Self-pay | Admitting: Internal Medicine

## 2023-06-14 DIAGNOSIS — Z79899 Other long term (current) drug therapy: Secondary | ICD-10-CM

## 2023-06-14 DIAGNOSIS — M0609 Rheumatoid arthritis without rheumatoid factor, multiple sites: Secondary | ICD-10-CM

## 2023-06-14 DIAGNOSIS — M7061 Trochanteric bursitis, right hip: Secondary | ICD-10-CM

## 2023-06-14 NOTE — Telephone Encounter (Signed)
Last Fill: 03/22/2023  Labs: 03/12/2023 normal  TB Gold: 05/14/2023 negative ([facility] Tab)    Next Visit: 07/08/2023  Last Visit: 03/12/2023   [id number] arthritis of multiple sites with negative rheumatoid factor   Current Dose per office note 03/12/2023: Rinvoq 15 mg p.o. daily   Okay to refill Rinvoq?

## 2023-06-14 NOTE — Telephone Encounter (Signed)
Patient contacted the office to request a medication refill.   1. Name of Medication: Rinvoq  2. How are you currently taking this medication (dosage and times per day)? 1 tablet / daily   3. What pharmacy would you like for that to be sent to? [facility]   FYI:  Patient's appointment was cancelled due to provider out of the office sick.  We told patient we will call back to reschedule when we get a cancellation since there are no available appointments until 08/10/23.

## 2023-06-18 ENCOUNTER — Other Ambulatory Visit: Payer: Self-pay

## 2023-06-18 MED ORDER — RINVOQ 15 MG PO TB24
ORAL_TABLET | ORAL | 0 refills | Status: DC
  Filled 2023-06-18: qty 90, fill #0

## 2023-06-21 ENCOUNTER — Other Ambulatory Visit: Payer: Self-pay

## 2023-06-21 ENCOUNTER — Other Ambulatory Visit (HOSPITAL_COMMUNITY): Payer: Self-pay

## 2023-06-21 MED ORDER — RINVOQ 15 MG PO TB24
1.0000 | ORAL_TABLET | Freq: Every day | ORAL | 3 refills | Status: DC
  Filled 2023-06-22: qty 30, 30d supply, fill #0
  Filled 2023-07-23: qty 30, 30d supply, fill #1
  Filled 2023-08-12: qty 30, 30d supply, fill #2
  Filled 2023-09-17: qty 30, 30d supply, fill #3
  Filled 2023-10-19: qty 30, 30d supply, fill #4
  Filled 2023-11-11: qty 30, 30d supply, fill #5

## 2023-06-22 ENCOUNTER — Other Ambulatory Visit: Payer: Self-pay

## 2023-06-22 ENCOUNTER — Ambulatory Visit: Payer: Managed Care, Other (non HMO) | Admitting: Family Medicine

## 2023-06-22 ENCOUNTER — Encounter: Payer: Self-pay | Admitting: Family Medicine

## 2023-06-22 VITALS — BP 120/73 | HR 82 | Ht 68.0 in | Wt 230.0 lb

## 2023-06-22 DIAGNOSIS — F324 Major depressive disorder, single episode, in partial remission: Secondary | ICD-10-CM

## 2023-06-22 DIAGNOSIS — R41840 Attention and concentration deficit: Secondary | ICD-10-CM

## 2023-06-22 NOTE — Progress Notes (Unsigned)
   Established Patient Office Visit  Subjective   Patient ID: Kathy Clark, female    DOB: [birth year]  Age: 62 y.o. MRN: [id number]  Chief Complaint  Patient presents with   mood    HPI  Here  for follow-up mood.  She sent a MyChart about 8 days ago as she was unfortunately on the Wellbutrin feeling extra sweaty and shaky at times and even nauseated.  She thought maybe it was even hypoglycemia but when she would check her sugars they were typically normal above 80.  She tried even decreasing her caffeine intake just to make sure that that was not contributing but did not notice any improvement.  She is been feeling pretty well off of medication completely and think she would really like to try going without for a while just to see. Medication that was super effective for her with Viibryd but the insurance would not cover it.  {History (Optional):23778}  ROS    Objective:     BP 120/73   Pulse 82   Ht 5\' 8"  (1.727 m)   Wt 230 lb (104.3 kg)   LMP  (LMP Unknown) Comment: hystrectomy  SpO2 99%   BMI 34.97 kg/m  {Vitals History (Optional):23777}  Physical Exam Vitals reviewed.  Constitutional:      Appearance: Normal appearance.  HENT:     Head: Normocephalic.  Cardiovascular:     Rate and Rhythm: Normal rate.  Pulmonary:     Effort: Pulmonary effort is normal.  Neurological:     Mental Status: She is alert and oriented to person, place, and time.  Psychiatric:        Mood and Affect: Mood normal.        Behavior: Behavior normal.      No results found for any visits on 06/22/23.  {Labs (Optional):23779}  The 10-year ASCVD risk score (Arnett DK, et al., 2019) is: 5.3%    Assessment & Plan:   Problem List Items Addressed This Visit       Other   Major depressive disorder with single episode, in partial remission (HCC) - Primary    Fluoxetine - shakey sweaty Wellbutrin  shakey sweaty Lexapro - not effective.  Could consider restarting Viibryd if  needed.  We could always try to go to the insurance since she has tried at least 3 other medications and really had some good efficacy with the Viibryd and did not have significant side effects.  She will continue to keep an eye on mood.      Inattention    Due for refill on Vyvanse in Jan        No follow-ups on file.    Nani Gasser, MD

## 2023-06-22 NOTE — Assessment & Plan Note (Signed)
Fluoxetine - shakey sweaty Wellbutrin  shakey sweaty Lexapro - not effective.  Could consider restarting Viibryd if needed.  We could always try to go to the insurance since she has tried at least 3 other medications and really had some good efficacy with the Viibryd and did not have significant side effects.  She will continue to keep an eye on mood.

## 2023-06-22 NOTE — Assessment & Plan Note (Signed)
Due for refill on Vyvanse in Jan

## 2023-06-22 NOTE — Progress Notes (Signed)
Specialty Pharmacy Refill Coordination Note  Kathy Clark is a 62 y.o. female contacted today regarding refills of specialty medication(s) Upadacitinib   Patient requested Delivery   Delivery date: 06/28/23   Verified address: [address] [place] [address]   Medication will be filled on 06/25/23.

## 2023-06-23 ENCOUNTER — Encounter: Payer: Self-pay | Admitting: Family Medicine

## 2023-07-13 ENCOUNTER — Other Ambulatory Visit (HOSPITAL_COMMUNITY): Payer: Self-pay

## 2023-07-16 ENCOUNTER — Encounter: Payer: Self-pay | Admitting: Family Medicine

## 2023-07-23 ENCOUNTER — Other Ambulatory Visit: Payer: Self-pay

## 2023-07-23 NOTE — Progress Notes (Signed)
 Specialty Pharmacy Refill Coordination Note  Kathy Clark is a 63 y.o. female contacted today regarding refills of specialty medication(s) Upadacitinib  (Rinvoq )   Patient requested Delivery   Delivery date: 07/26/23   Verified address: [address]   [place] [address]   Medication will be filled on 07/23/23.

## 2023-07-24 ENCOUNTER — Other Ambulatory Visit: Payer: Self-pay | Admitting: Family Medicine

## 2023-07-24 DIAGNOSIS — R41840 Attention and concentration deficit: Secondary | ICD-10-CM

## 2023-07-24 DIAGNOSIS — R5383 Other fatigue: Secondary | ICD-10-CM

## 2023-07-26 MED ORDER — LISDEXAMFETAMINE DIMESYLATE 40 MG PO CAPS: 40.0000 mg | ORAL_CAPSULE | Freq: Every day | ORAL | 0 refills | Status: DC

## 2023-07-28 ENCOUNTER — Other Ambulatory Visit (HOSPITAL_COMMUNITY): Payer: Self-pay

## 2023-07-28 MED ORDER — DEXCOM G7 SENSOR MISC
3.0000 | 11 refills | Status: DC
  Filled 2023-07-28: qty 3, 30d supply, fill #0
  Filled 2023-08-02: qty 3, 90d supply, fill #0

## 2023-08-02 ENCOUNTER — Other Ambulatory Visit (HOSPITAL_COMMUNITY): Payer: Self-pay

## 2023-08-03 ENCOUNTER — Other Ambulatory Visit (HOSPITAL_COMMUNITY): Payer: Self-pay

## 2023-08-03 ENCOUNTER — Other Ambulatory Visit: Payer: Self-pay

## 2023-08-04 ENCOUNTER — Other Ambulatory Visit (HOSPITAL_COMMUNITY): Payer: Self-pay

## 2023-08-12 ENCOUNTER — Other Ambulatory Visit (HOSPITAL_COMMUNITY): Payer: Self-pay

## 2023-08-12 ENCOUNTER — Other Ambulatory Visit: Payer: Self-pay

## 2023-08-12 NOTE — Progress Notes (Signed)
Specialty Pharmacy Refill Coordination Note  Kathy Clark is a 63 y.o. female contacted today regarding refills of specialty medication(s) Upadacitinib (Rinvoq)   Patient requested Delivery   Delivery date: 08/20/23   Verified address: [address]  [place] [address]   Medication will be filled on 08/19/23.

## 2023-08-18 ENCOUNTER — Other Ambulatory Visit (HOSPITAL_COMMUNITY): Payer: Self-pay

## 2023-08-18 LAB — HM MAMMOGRAPHY

## 2023-08-18 MED ORDER — ESTRADIOL 0.075 MG/24HR TD PTTW
1.0000 | MEDICATED_PATCH | TRANSDERMAL | 3 refills | Status: DC
  Filled 2023-08-18 – 2023-08-25 (×4): qty 24, 84d supply, fill #0

## 2023-08-19 ENCOUNTER — Other Ambulatory Visit (HOSPITAL_COMMUNITY): Payer: Self-pay

## 2023-08-23 ENCOUNTER — Other Ambulatory Visit: Payer: Self-pay | Admitting: Family Medicine

## 2023-08-23 ENCOUNTER — Other Ambulatory Visit (HOSPITAL_COMMUNITY): Payer: Self-pay

## 2023-08-23 ENCOUNTER — Other Ambulatory Visit: Payer: Self-pay

## 2023-08-23 DIAGNOSIS — I493 Ventricular premature depolarization: Secondary | ICD-10-CM

## 2023-08-23 DIAGNOSIS — I10 Essential (primary) hypertension: Secondary | ICD-10-CM

## 2023-08-24 ENCOUNTER — Other Ambulatory Visit (HOSPITAL_COMMUNITY): Payer: Self-pay

## 2023-08-24 ENCOUNTER — Other Ambulatory Visit: Payer: Self-pay

## 2023-08-24 MED ORDER — ESCITALOPRAM OXALATE 10 MG PO TABS
10.0000 mg | ORAL_TABLET | Freq: Every day | ORAL | 1 refills | Status: DC
  Filled 2023-08-24: qty 90, 90d supply, fill #0

## 2023-08-25 ENCOUNTER — Other Ambulatory Visit (HOSPITAL_COMMUNITY): Payer: Self-pay

## 2023-08-25 ENCOUNTER — Other Ambulatory Visit: Payer: Self-pay

## 2023-08-28 ENCOUNTER — Other Ambulatory Visit (HOSPITAL_COMMUNITY): Payer: Self-pay

## 2023-08-28 MED ORDER — LOSARTAN POTASSIUM 25 MG PO TABS
25.0000 mg | ORAL_TABLET | Freq: Every day | ORAL | 1 refills | Status: DC
  Filled 2023-08-28: qty 90, 90d supply, fill #0

## 2023-08-28 MED ORDER — VERAPAMIL HCL ER 120 MG PO TBCR
120.0000 mg | EXTENDED_RELEASE_TABLET | Freq: Every day | ORAL | 1 refills | Status: DC
  Filled 2023-08-28: qty 90, 90d supply, fill #0

## 2023-08-28 MED ORDER — HYDROCHLOROTHIAZIDE 25 MG PO TABS
25.0000 mg | ORAL_TABLET | Freq: Every day | ORAL | 1 refills | Status: DC
  Filled 2023-08-28: qty 90, 90d supply, fill #0

## 2023-08-30 ENCOUNTER — Other Ambulatory Visit (HOSPITAL_COMMUNITY): Payer: Self-pay

## 2023-09-08 ENCOUNTER — Telehealth: Payer: Self-pay | Admitting: Pharmacist

## 2023-09-08 NOTE — Telephone Encounter (Signed)
 Called patient to schedule an appointment for the [facility] [employer] [facility]. I was unable to reach the patient so I left a HIPAA-compliant message requesting that the patient return my call.   Butch Penny, PharmD, Patsy [place], CPP Clinical Pharmacist [facility] & [facility] [phone]

## 2023-09-13 ENCOUNTER — Ambulatory Visit: Attending: Family Medicine | Admitting: Pharmacist

## 2023-09-13 ENCOUNTER — Encounter: Payer: Self-pay | Admitting: Pharmacist

## 2023-09-13 DIAGNOSIS — Z79899 Other long term (current) drug therapy: Secondary | ICD-10-CM

## 2023-09-13 NOTE — Progress Notes (Signed)
  S: Patient presents today for review of their specialty medication.   Patient is currently taking Rinvoq for rheumatoid arthritis. Patient is managed by Dr. Dimple Casey for this.   Dosing: Adult  Note: May be used as monotherapy or in combination with methotrexate or other nonbiologic disease-modifying antirheumatic drugs (DMARDs); use in combination with biologic DMARDS or potent immunosuppressants (eg, azathioprine, cyclosporine) is not recommended. Do not initiate therapy in patients with an absolute lymphocyte count <500/mm3, ANC <1,000/mm3, or hemoglobin <8 g/dL. Rheumatoid arthritis: Oral: 15 mg once daily.  Adherence: confirmed  Efficacy: reported, however, efficacy may be decreasing. She's hopeful that this is due mainly to the weather but will continued to monitor.   Monitoring: S/sx thromboembolism: none  S/sx malignancy: none  S/sx of infection: none   Current adverse effects: none    O:     Lab Results  Component Value Date   WBC 6.8 02/16/2023   HGB 13.8 02/16/2023   HCT 41.1 02/16/2023   MCV 93 02/16/2023   PLT 311 02/16/2023      Chemistry      Component Value Date/Time   NA 136 03/12/2023 1156   NA 139 11/08/2019 0806   K 4.0 03/12/2023 1156   CL 99 03/12/2023 1156   CO2 26 03/12/2023 1156   BUN 17 03/12/2023 1156   BUN 18 11/08/2019 0806   CREATININE 0.64 03/12/2023 1156      Component Value Date/Time   CALCIUM 10.0 03/12/2023 1156   ALKPHOS 66 11/08/2019 0806   AST 18 03/12/2023 1156   ALT 24 03/12/2023 1156   BILITOT 0.5 03/12/2023 1156   BILITOT 0.3 11/08/2019 0806       Lab Results  Component Value Date   CHOL 219 (H) 09/11/2022   HDL 63 09/11/2022   LDLCALC 135 (H) 09/11/2022   TRIG 107 09/11/2022   CHOLHDL 3.5 09/11/2022     A/P: 1. Medication review: patient currently taking Rinvoq for rheumatoid arthritis. Reviewed the medication with the patient, including the following: Rinvoq is a medication used to treat rheumatoid arthritis.  Administer with or without food. Swallow tablet whole; do not crush, split, or chew. Possible adverse effects include increased risk of infection, GI upset, hematologic toxicity, hepatic effects, lipid abnormalities, increased risk of malignancy, thromboembolism. Avoid live vaccinations. No recommendations for any changes.  Butch Penny, PharmD, Patsy [place], CPP Clinical Pharmacist [facility] & [facility] [phone]

## 2023-09-14 ENCOUNTER — Other Ambulatory Visit (HOSPITAL_COMMUNITY): Payer: Self-pay

## 2023-09-17 ENCOUNTER — Other Ambulatory Visit (HOSPITAL_COMMUNITY): Payer: Self-pay

## 2023-09-17 ENCOUNTER — Other Ambulatory Visit: Payer: Self-pay

## 2023-09-17 NOTE — Progress Notes (Signed)
 Specialty Pharmacy Refill Coordination Note  Kathy Clark is a 63 y.o. female contacted today regarding refills of specialty medication(s) Upadacitinib (Rinvoq)   Patient requested Delivery   Delivery date: 09/28/23   Verified address: [address]  [place] [address]   Medication will be filled on 09/27/23.

## 2023-09-27 ENCOUNTER — Other Ambulatory Visit: Payer: Self-pay

## 2023-09-28 ENCOUNTER — Encounter: Payer: Self-pay | Admitting: Family Medicine

## 2023-09-28 ENCOUNTER — Ambulatory Visit (INDEPENDENT_AMBULATORY_CARE_PROVIDER_SITE_OTHER): Admitting: Family Medicine

## 2023-09-28 VITALS — BP 125/80 | HR 85 | Ht 68.0 in | Wt 227.0 lb

## 2023-09-28 DIAGNOSIS — Z6834 Body mass index (BMI) 34.0-34.9, adult: Secondary | ICD-10-CM | POA: Diagnosis not present

## 2023-09-28 DIAGNOSIS — R931 Abnormal findings on diagnostic imaging of heart and coronary circulation: Secondary | ICD-10-CM | POA: Diagnosis not present

## 2023-09-28 DIAGNOSIS — I10 Essential (primary) hypertension: Secondary | ICD-10-CM | POA: Diagnosis not present

## 2023-09-28 DIAGNOSIS — Z23 Encounter for immunization: Secondary | ICD-10-CM | POA: Diagnosis not present

## 2023-09-28 DIAGNOSIS — R911 Solitary pulmonary nodule: Secondary | ICD-10-CM | POA: Diagnosis not present

## 2023-09-28 NOTE — Progress Notes (Signed)
 Established Patient Office Visit  Subjective  Patient ID: LAKOTA SCHWEPPE, female    DOB: 1961-04-29  Age: 63 y.o. MRN: [id number]  Chief Complaint  Patient presents with   Results    CT SCORE RESULTS    HPI  He is doing well overall.  Mood has been good.  She has been following with Dr. Gertie Exon for weight loss she has been on a keto diet as well as using her treadmill in the mornings and doing some bands for his resistance training.  She is doing fantastic.  Weight is down to 227 today.  She is lost a total of 10+ pounds.  And is feeling well.  She reports blood pressures have been running a little bit lower for her appointments since losing some weight.  But her home blood pressure monitor is still showing some slightly higher pressures so she has been hesitant to stop the medication.  She has been asymptomatic no lightheadedness or dizziness.  She also had a calcium CT score and would like to go over those results today test was performed in January and they did note a Nodularity in the right upper lobe measuring approximately 14 mm.  They did recommend repeat CT in 3 months.  No prior smoking history.    ROS    Objective:     BP 125/80   Pulse 85   Ht 5\' 8"  (1.727 m)   Wt 227 lb (103 kg)   LMP  (LMP Unknown) Comment: hystrectomy  SpO2 98%   BMI 34.52 kg/m    Physical Exam Vitals and nursing note reviewed.  Constitutional:      Appearance: Normal appearance.  HENT:     Head: Normocephalic and atraumatic.  Eyes:     Conjunctiva/sclera: Conjunctivae normal.  Cardiovascular:     Rate and Rhythm: Normal rate and regular rhythm.  Pulmonary:     Effort: Pulmonary effort is normal.     Breath sounds: Normal breath sounds.  Skin:    General: Skin is warm and dry.  Neurological:     Mental Status: She is alert.  Psychiatric:        Mood and Affect: Mood normal.      No results found for any visits on 09/28/23.    The 10-year ASCVD risk score (Arnett DK, et  al., 2019) is: 5.7%    Assessment & Plan:   Problem List Items Addressed This Visit       Cardiovascular and Mediastinum   HYPERTENSION, BENIGN ESSENTIAL   BP looks awesome today.  Consider getting a new maching.   Will have labs done with Dr. Ancil Linsey.  Can always cut the losartan in half if needed or stop completely.  Can always restart if blood pressures are going high again.  But she still has the verapamil on board for palpitations and she likes the benefits of the hydrochlorothiazide for peripheral swelling.      Elevated coronary artery calcium score   Test performed at [facility] on July 27, 2023.  Score of 11, 65th percentile.  Discussed potential benefit of a statin.  She is going to have her lipids rechecked in a couple of weeks.  She is working diligently on her diet and exercising regularly.        Respiratory   Nodule of upper lobe of right lung   Recommend repeat CT between 3 to 6 months.  They are going to schedule it probably for June which would be a  79-month follow-up which I think is appropriate.  She is overall low risk for lung cancer.  And radiologist noted that they feel like it is more consistent with scarring.        Other   BMI 34.0-34.9,adult   BMI down to 34 which is fantastic.      Other Visit Diagnoses       Encounter for immunization    -  Primary   Relevant Orders   Varicella-zoster vaccine IM (Completed)       Given second shingles vaccine today.  No follow-ups on file.    Nani Gasser, MD

## 2023-09-28 NOTE — Assessment & Plan Note (Addendum)
 BP looks awesome today.  Consider getting a new maching.   Will have labs done with Dr. Ancil Linsey.  Can always cut the losartan in half if needed or stop completely.  Can always restart if blood pressures are going high again.  But she still has the verapamil on board for palpitations and she likes the benefits of the hydrochlorothiazide for peripheral swelling.

## 2023-09-28 NOTE — Assessment & Plan Note (Signed)
 Recommend repeat CT between 3 to 6 months.  They are going to schedule it probably for June which would be a 94-month follow-up which I think is appropriate.  She is overall low risk for lung cancer.  And radiologist noted that they feel like it is more consistent with scarring.

## 2023-09-28 NOTE — Assessment & Plan Note (Addendum)
 BMI down to 34 which is fantastic.

## 2023-09-28 NOTE — Assessment & Plan Note (Signed)
 Test performed at [facility] on July 27, 2023.  Score of 11, 65th percentile.  Discussed potential benefit of a statin.  She is going to have her lipids rechecked in a couple of weeks.  She is working diligently on her diet and exercising regularly.

## 2023-10-09 ENCOUNTER — Other Ambulatory Visit: Payer: Self-pay | Admitting: Family Medicine

## 2023-10-09 DIAGNOSIS — T50905A Adverse effect of unspecified drugs, medicaments and biological substances, initial encounter: Secondary | ICD-10-CM

## 2023-10-14 ENCOUNTER — Other Ambulatory Visit (HOSPITAL_COMMUNITY): Payer: Self-pay

## 2023-10-14 ENCOUNTER — Other Ambulatory Visit: Payer: Self-pay

## 2023-10-14 MED ORDER — EPINEPHRINE 0.3 MG/0.3ML IJ SOAJ
0.3000 mg | INTRAMUSCULAR | 99 refills | Status: AC | PRN
  Filled 2023-10-14: qty 2, 2d supply, fill #0

## 2023-10-15 ENCOUNTER — Telehealth: Admitting: Physician Assistant

## 2023-10-15 DIAGNOSIS — H6993 Unspecified Eustachian tube disorder, bilateral: Secondary | ICD-10-CM

## 2023-10-15 DIAGNOSIS — J302 Other seasonal allergic rhinitis: Secondary | ICD-10-CM | POA: Diagnosis not present

## 2023-10-15 MED ORDER — AZELASTINE HCL 0.1 % NA SOLN: 1.0000 | Freq: Two times a day (BID) | NASAL | 0 refills | Status: DC

## 2023-10-15 NOTE — Progress Notes (Signed)
 E visit for Allergic Rhinitis We are sorry that you are not feeling well.  Here is how we plan to help!  Based on what you have shared with me it looks like you have Allergic Rhinitis.  Rhinitis is when a reaction occurs that causes nasal congestion, runny nose, sneezing, and itching.  Most types of rhinitis are caused by an inflammation and are associated with symptoms in the eyes ears or throat. There are several types of rhinitis.  The most common are acute rhinitis, which is usually caused by a viral illness, allergic or seasonal rhinitis, and nonallergic or year-round rhinitis.  Nasal allergies occur certain times of the year.  Allergic rhinitis is caused when allergens in the air trigger the release of histamine in the body.  Histamine causes itching, swelling, and fluid to build up in the fragile linings of the nasal passages, sinuses and eyelids.  An itchy nose and clear discharge are common.  I recommend the following over the counter treatments: You should take a daily dose of antihistamine, Continue Allegra  I also would recommend a nasal spray: Continue Flonase 2 sprays into each nostril once daily, I will add Azelastine nasal spray Use 1 spray in each nostril twice daily. This is okay to use with Flonase.  You could add Coricidin HBP OTC for decongestant if the Azelastine does not offer enough relief in the next 3 days or so.     HOME CARE:  You can use an over-the-counter saline nasal spray as needed Avoid areas where there is heavy dust, mites, or molds Stay indoors on windy days during the pollen season Keep windows closed in home, at least in bedroom; use air conditioner. Use high-efficiency house air filter Keep windows closed in car, turn AC on re-circulate Avoid playing out with dog during pollen season  GET HELP RIGHT AWAY IF:  If your symptoms do not improve within 10 days You become short of breath You develop yellow or green discharge from your nose for over 3  days You have coughing fits  MAKE SURE YOU:  Understand these instructions Will watch your condition Will get help right away if you are not doing well or get worse  Thank you for choosing an e-visit. Your e-visit answers were reviewed by a board certified advanced clinical practitioner to complete your personal care plan. Depending upon the condition, your plan could have included both over the counter or prescription medications. Please review your pharmacy choice. Be sure that the pharmacy you have chosen is open so that you can pick up your prescription now.  If there is a problem you may message your provider in MyChart to have the prescription routed to another pharmacy. Your safety is important to [place]. If you have drug allergies check your prescription carefully.  For the next 24 hours, you can use MyChart to ask questions about today's visit, request a non-urgent call back, or ask for a work or school excuse from your e-visit provider. You will get an email in the next two days asking about your experience. I hope that your e-visit has been valuable and will speed your recovery.    I have spent 5 minutes in review of e-visit questionnaire, review and updating patient chart, medical decision making and response to patient.   Margaretann Loveless, PA-C

## 2023-10-19 ENCOUNTER — Other Ambulatory Visit (HOSPITAL_COMMUNITY): Payer: Self-pay

## 2023-10-19 ENCOUNTER — Encounter: Payer: Self-pay | Admitting: Family Medicine

## 2023-10-19 ENCOUNTER — Other Ambulatory Visit: Payer: Self-pay

## 2023-10-19 DIAGNOSIS — I10 Essential (primary) hypertension: Secondary | ICD-10-CM

## 2023-10-19 DIAGNOSIS — I493 Ventricular premature depolarization: Secondary | ICD-10-CM

## 2023-10-19 DIAGNOSIS — R5383 Other fatigue: Secondary | ICD-10-CM

## 2023-10-19 DIAGNOSIS — R41840 Attention and concentration deficit: Secondary | ICD-10-CM

## 2023-10-19 MED ORDER — HYDROCHLOROTHIAZIDE 25 MG PO TABS
25.0000 mg | ORAL_TABLET | Freq: Every day | ORAL | 0 refills | Status: DC
  Filled 2023-10-19: qty 90, 90d supply, fill #0

## 2023-10-19 MED ORDER — LISDEXAMFETAMINE DIMESYLATE 40 MG PO CAPS
40.0000 mg | ORAL_CAPSULE | Freq: Every day | ORAL | 0 refills | Status: DC
  Filled 2023-10-19 – 2023-11-11 (×3): qty 90, 90d supply, fill #0

## 2023-10-19 MED ORDER — VERAPAMIL HCL ER 120 MG PO TBCR
120.0000 mg | EXTENDED_RELEASE_TABLET | Freq: Every day | ORAL | 0 refills | Status: DC
  Filled 2023-10-19: qty 90, 90d supply, fill #0

## 2023-10-19 NOTE — Progress Notes (Signed)
 Specialty Pharmacy Refill Coordination Note  Kathy Clark is a 63 y.o. female contacted today regarding refills of specialty medication(s) Upadacitinib (Rinvoq)   Patient requested Delivery   Delivery date: 10/25/23   Verified address: [address]  [place]  [address]   Medication will be filled on 10/22/23.

## 2023-10-19 NOTE — Progress Notes (Signed)
 Specialty Pharmacy Ongoing Clinical Assessment Note  Kathy Clark is a 63 y.o. female who is being followed by the specialty pharmacy service for RxSp Rheumatoid Arthritis   Patient's specialty medication(s) reviewed today: Upadacitinib (Rinvoq)   Missed doses in the last 4 weeks: 0   Patient/Caregiver did not have any additional questions or concerns.   Therapeutic benefit summary: Patient is achieving benefit   Adverse events/side effects summary: No adverse events/side effects   Patient's therapy is appropriate to: Continue    Goals Addressed             This Visit's Progress    Reduce signs and symptoms   On track    Patient is on track. Patient will maintain adherence. Patient reports feeling great since she started treatment. She is very satisfied with the relief she has seen.          Follow up:  6 months  Otto Herb Specialty Pharmacist

## 2023-10-19 NOTE — Telephone Encounter (Signed)
 Last OV: 09/28/23 Next OV: no appt scheduled Last RF: 07/26/23

## 2023-10-20 ENCOUNTER — Other Ambulatory Visit (HOSPITAL_COMMUNITY): Payer: Self-pay

## 2023-10-20 ENCOUNTER — Encounter: Payer: Self-pay | Admitting: Podiatry

## 2023-10-20 ENCOUNTER — Other Ambulatory Visit: Payer: Self-pay

## 2023-10-20 MED ORDER — ESTRADIOL 0.075 MG/24HR TD PTTW
1.0000 | MEDICATED_PATCH | TRANSDERMAL | 3 refills | Status: DC
Start: 2023-10-21 — End: 2023-11-02
  Filled 2023-10-20 – 2023-10-21 (×3): qty 24, 84d supply, fill #0

## 2023-10-21 ENCOUNTER — Other Ambulatory Visit: Payer: Self-pay

## 2023-10-21 ENCOUNTER — Other Ambulatory Visit (HOSPITAL_COMMUNITY): Payer: Self-pay

## 2023-10-21 ENCOUNTER — Telehealth: Admitting: Physician Assistant

## 2023-10-21 DIAGNOSIS — B9689 Other specified bacterial agents as the cause of diseases classified elsewhere: Secondary | ICD-10-CM

## 2023-10-21 MED ORDER — AMOXICILLIN-POT CLAVULANATE 875-125 MG PO TABS: 1.0000 | ORAL_TABLET | Freq: Two times a day (BID) | ORAL | 0 refills | Status: DC

## 2023-10-21 MED ORDER — PREDNISONE 5 MG PO TABS
5.0000 mg | ORAL_TABLET | Freq: Every day | ORAL | 1 refills | Status: DC
Start: 2023-10-21 — End: 2023-11-02
  Filled 2023-10-21: qty 30, 30d supply, fill #0

## 2023-10-21 NOTE — Progress Notes (Signed)

## 2023-10-21 NOTE — Progress Notes (Signed)
 I have spent 5 minutes in review of e-visit questionnaire, review and updating patient chart, medical decision making and response to patient.   [name], PA-C

## 2023-10-24 ENCOUNTER — Telehealth: Admitting: Nurse Practitioner

## 2023-10-24 DIAGNOSIS — J4521 Mild intermittent asthma with (acute) exacerbation: Secondary | ICD-10-CM

## 2023-10-24 MED ORDER — PREDNISONE 20 MG PO TABS: 20.0000 mg | ORAL_TABLET | Freq: Every day | ORAL | 0 refills | Status: AC

## 2023-10-24 NOTE — Progress Notes (Signed)
 I have spent 5 minutes in review of e-visit questionnaire, review and updating patient chart, medical decision making and response to patient.   Claiborne Rigg, NP

## 2023-10-24 NOTE — Progress Notes (Signed)
 E-Visit for Cough   We are sorry that you are not feeling well.  Here is how we plan to help!  Based on your presentation I believe you most likely have A cough due to a virus.  This is called viral bronchitis and is best treated by rest, plenty of fluids and control of the cough.  You may use Ibuprofen or Tylenol as directed to help your symptoms.     In addition you may use short term prednisone therapy which has been sent to the pharmacy today.     From your responses in the eVisit questionnaire you describe inflammation in the upper respiratory tract which is causing a significant cough.  This is commonly called Bronchitis and has four common causes:   Allergies Viral Infections Acid Reflux Bacterial Infection Allergies, viruses and acid reflux are treated by controlling symptoms or eliminating the cause. An example might be a cough caused by taking certain blood pressure medications. You stop the cough by changing the medication. Another example might be a cough caused by acid reflux. Controlling the reflux helps control the cough.  USE OF BRONCHODILATOR ("RESCUE") INHALERS: There is a risk from using your bronchodilator too frequently.  The risk is that over-reliance on a medication which only relaxes the muscles surrounding the breathing tubes can reduce the effectiveness of medications prescribed to reduce swelling and congestion of the tubes themselves.  Although you feel brief relief from the bronchodilator inhaler, your asthma may actually be worsening with the tubes becoming more swollen and filled with mucus.  This can delay other crucial treatments, such as oral steroid medications. If you need to use a bronchodilator inhaler daily, several times per day, you should discuss this with your provider.  There are probably better treatments that could be used to keep your asthma under control.     HOME CARE Only take medications as instructed by your medical team. Complete the entire  course of an antibiotic. Drink plenty of fluids and get plenty of rest. Avoid close contacts especially the very young and the elderly Cover your mouth if you cough or cough into your sleeve. Always remember to wash your hands A steam or ultrasonic humidifier can help congestion.   GET HELP RIGHT AWAY IF: You develop worsening fever. You become short of breath You cough up blood. Your symptoms persist after you have completed your treatment plan MAKE SURE YOU  Understand these instructions. Will watch your condition. Will get help right away if you are not doing well or get worse.    Thank you for choosing an e-visit.  Your e-visit answers were reviewed by a board certified advanced clinical practitioner to complete your personal care plan. Depending upon the condition, your plan could have included both over the counter or prescription medications.  Please review your pharmacy choice. Make sure the pharmacy is open so you can pick up prescription now. If there is a problem, you may contact your provider through [employer] and have the prescription routed to another pharmacy.  Your safety is important to us . If you have drug allergies check your prescription carefully.   For the next 24 hours you can use MyChart to ask questions about today's visit, request a non-urgent call back, or ask for a work or school excuse. You will get an email in the next two days asking about your experience. I hope that your e-visit has been valuable and will speed your recovery.

## 2023-10-28 ENCOUNTER — Other Ambulatory Visit: Payer: Self-pay

## 2023-10-28 ENCOUNTER — Other Ambulatory Visit (HOSPITAL_COMMUNITY): Payer: Self-pay

## 2023-10-28 ENCOUNTER — Encounter: Payer: Self-pay | Admitting: Family Medicine

## 2023-10-28 MED ORDER — AMOXICILLIN 500 MG PO CAPS: 2000.0000 mg | ORAL_CAPSULE | Freq: Once | ORAL | 0 refills | Status: DC | PRN

## 2023-10-28 MED ORDER — HYDROCHLOROTHIAZIDE 12.5 MG PO TABS
12.5000 mg | ORAL_TABLET | Freq: Every day | ORAL | 0 refills | Status: DC
  Filled 2023-10-28: qty 90, 90d supply, fill #0

## 2023-11-02 ENCOUNTER — Encounter: Payer: Self-pay | Admitting: Family Medicine

## 2023-11-02 ENCOUNTER — Telehealth: Admitting: Family Medicine

## 2023-11-02 VITALS — BP 106/75 | HR 72 | Temp 97.7°F | Ht 68.0 in | Wt 218.0 lb

## 2023-11-02 DIAGNOSIS — T508X5A Adverse effect of diagnostic agents, initial encounter: Secondary | ICD-10-CM | POA: Diagnosis not present

## 2023-11-02 DIAGNOSIS — J301 Allergic rhinitis due to pollen: Secondary | ICD-10-CM

## 2023-11-02 MED ORDER — HYDROCODONE BIT-HOMATROP MBR 5-1.5 MG/5ML PO SOLN: 5.0000 mL | Freq: Three times a day (TID) | ORAL | 0 refills | Status: DC | PRN

## 2023-11-02 MED ORDER — PREDNISONE 20 MG PO TABS: ORAL_TABLET | ORAL | 0 refills | Status: AC

## 2023-11-02 MED ORDER — METHYLPREDNISOLONE ACETATE 80 MG/ML IJ SUSP: 80.0000 mg | Freq: Once | INTRAMUSCULAR | Status: DC

## 2023-11-02 NOTE — Progress Notes (Signed)
 Virtual Visit via Video Note  I connected with Kathy Clark on 11/02/23 at 10:10 AM EDT by a video enabled telemedicine application and verified that I am speaking with the correct person using two identifiers.   I discussed the limitations of evaluation and management by telemedicine and the availability of in person appointments. The patient expressed understanding and agreed to proceed.  Patient location: at home Provider location: in office  Subjective:    CC:  No chief complaint on file.   HPI:  Here today for not feeling well with upper respiratory symptoms.  At the beginning of April she felt like she was just having a lot of reaction to the pollen.  She did an e-visit for cough on April 13.  She was given azelastine  nasal spray.  She was not improving and so was then given a prescription for Augmentin  and then finally prednisone .  She does feel like the prednisone  was somewhat helpful.  She has been using a nasal saline spray.  Also some Coricidin for cough but it makes her not feel good.  Mucus is mostly thick and white now.  Still having a lot of nasal congestion and drainage.  She did feel better when she was on the 20 mg prednisone  for 5 days.  And she did complete the full 7-day course of Augmentin .  Past medical history, Surgical history, Family history not pertinant except as noted below, Social history, Allergies, and medications have been entered into the medical record, reviewed, and corrections made.    Objective:    General: Speaking clearly in complete sentences without any shortness of breath.  Alert and oriented x3.  Normal judgment. No apparent acute distress.    Impression and Recommendations:    Problem List Items Addressed This Visit       Respiratory   Allergic rhinitis - Primary   Nasal saline irrigation and continue Flonase  she has bumped it up to 2 sprays in each nostril.  Will do another round of prednisone  but taper so we will treat for a  total of 11 days.  I do not think we need to do another round of antibiotics at this point.  If not improving please let me know.  Cough syrup sent for bedtime.  Reassured that there is no residual bacterial infection no purulent drainage from the sinuses.  I think it is just a lot of inflammation and mucus production right now.  Avoid antihistamines since they are drying and continue with the nasal steroid spray, Flonase .  No orders of the defined types were placed in this encounter.   Meds ordered this encounter  Medications   DISCONTD: methylPREDNISolone  acetate (DEPO-MEDROL ) injection 80 mg   predniSONE  (DELTASONE ) 20 MG tablet    Sig: Take 2 tablets (40 mg total) by mouth daily with breakfast for 5 days, THEN 1 tablet (20 mg total) daily with breakfast for 3 days, THEN 0.5 tablets (10 mg total) daily with breakfast for 3 days.    Dispense:  15 tablet    Refill:  0   HYDROcodone  bit-homatropine (HYCODAN) 5-1.5 MG/5ML syrup    Sig: Take 5 mLs by mouth every 8 (eight) hours as needed for cough.    Dispense:  120 mL    Refill:  0    I discussed the assessment and treatment plan with the patient. The patient was provided an opportunity to ask questions and all were answered. The patient agreed with the plan and demonstrated an understanding  of the instructions.   The patient was advised to call back or seek an in-person evaluation if the symptoms worsen or if the condition fails to improve as anticipated.   Duaine German, MD

## 2023-11-02 NOTE — Progress Notes (Signed)
 Spoke w/pt and she stated that she has had 2 E-visits for her sinuses. She was prescribed Augmentin  for 7 days and Prednisone . During this time she did stop her Rinvoq . She restarted this on Sunday.  She currently reports that her sxs have been a dry hacky cough, her maxillary sinuses feel inflamed. Yesterday she had a low grade temp of 99. She doesn't have a temperature today.   She wanted to touch basis with Dr. Greer Leak about this to see if there is anything else that she should be doing or that could be done.

## 2023-11-10 ENCOUNTER — Other Ambulatory Visit (HOSPITAL_COMMUNITY): Payer: Self-pay

## 2023-11-11 ENCOUNTER — Other Ambulatory Visit (HOSPITAL_BASED_OUTPATIENT_CLINIC_OR_DEPARTMENT_OTHER): Payer: Self-pay

## 2023-11-11 ENCOUNTER — Other Ambulatory Visit: Payer: Self-pay

## 2023-11-11 ENCOUNTER — Other Ambulatory Visit (HOSPITAL_COMMUNITY): Payer: Self-pay

## 2023-11-11 NOTE — Progress Notes (Signed)
 Specialty Pharmacy Refill Coordination Note  Kathy Clark is a 63 y.o. female contacted today regarding refills of specialty medication(s) Upadacitinib  (Rinvoq )   Patient requested (Patient-Rptd) Delivery   Delivery date: (Patient-Rptd) 11/26/23   Verified address: (Patient-Rptd) [address] [place] [address]   Medication will be filled on 05.15.25.

## 2023-12-08 ENCOUNTER — Other Ambulatory Visit: Payer: Self-pay

## 2023-12-08 ENCOUNTER — Encounter: Payer: Self-pay | Admitting: Family Medicine

## 2023-12-08 MED ORDER — CIMZIA-STARTER 200 MG/ML ~~LOC~~ PSKT
PREFILLED_SYRINGE | SUBCUTANEOUS | 0 refills | Status: DC
  Filled 2023-12-10 – 2023-12-13 (×2): qty 6, 28d supply, fill #0

## 2023-12-08 NOTE — Progress Notes (Signed)
 Therapy is changing from Rinvoq  to Cimzia. Routed to [facility].

## 2023-12-09 ENCOUNTER — Ambulatory Visit: Admitting: Family Medicine

## 2023-12-09 ENCOUNTER — Other Ambulatory Visit: Payer: Self-pay

## 2023-12-09 ENCOUNTER — Encounter: Payer: Self-pay | Admitting: Family Medicine

## 2023-12-09 VITALS — BP 129/86 | HR 86 | Ht 68.0 in | Wt 219.0 lb

## 2023-12-09 DIAGNOSIS — K449 Diaphragmatic hernia without obstruction or gangrene: Secondary | ICD-10-CM

## 2023-12-09 DIAGNOSIS — M0600 Rheumatoid arthritis without rheumatoid factor, unspecified site: Secondary | ICD-10-CM

## 2023-12-09 DIAGNOSIS — I288 Other diseases of pulmonary vessels: Secondary | ICD-10-CM | POA: Insufficient documentation

## 2023-12-09 DIAGNOSIS — R921 Mammographic calcification found on diagnostic imaging of breast: Secondary | ICD-10-CM

## 2023-12-09 DIAGNOSIS — I77819 Aortic ectasia, unspecified site: Secondary | ICD-10-CM | POA: Diagnosis not present

## 2023-12-09 DIAGNOSIS — R911 Solitary pulmonary nodule: Secondary | ICD-10-CM | POA: Diagnosis not present

## 2023-12-09 DIAGNOSIS — I10 Essential (primary) hypertension: Secondary | ICD-10-CM

## 2023-12-09 DIAGNOSIS — R590 Localized enlarged lymph nodes: Secondary | ICD-10-CM

## 2023-12-09 DIAGNOSIS — R058 Other specified cough: Secondary | ICD-10-CM

## 2023-12-09 NOTE — Assessment & Plan Note (Addendum)
"  New lobulated solid nodule in the right middle lobe measuring approximately 1 x 1.3 cm. This could be due to infectious/inflammatory process." - has a dry cough but not other indication of infection.    Will need repeat CT in 3 mo this is new from lesion right middle lobe seen previously.

## 2023-12-09 NOTE — Progress Notes (Addendum)
 Established Patient Office Visit  Subjective  Patient ID: Kathy Clark, female    DOB: [birth year]  Age: 63 y.o. MRN: [id number]  Chief Complaint  Patient presents with   Results    HPI Here to go over recent Chest CT results.  Also recently stopped losartan  and cut hydrochlorothiazide  to 12.5mg .  has noted BPS in the 130s as home and started getting more swelling in the extremities so went back up to 25mg  hydrochlorothiazide .   Has a dry cough ever since had Bronchitis end of April.  O/w doesn't feel sick. Has occ fever but thinks could be from her RA which is not well controlled right now.    Last mammo done in Feb At [facility]   CT CHEST WO CONTRAST, 12/06/2023 8:41 AM Results Impression New lobulated solid nodule in the right middle lobe measuring approximately 1 x 1.3 cm. This could be due to infectious/inflammatory process. Recommend reevaluation with a low-dose CT scan of the chest in 3 months. Otherwise unchanged previously seen bilateral pulmonary nodules, scar and linear opacities. Fusiform dilatation of ascending thoracic aorta measures 4.1 x 4.1 cm. Dilated main pulmonary tree measuring up to 3.6 cm. This can be seen in pulmonary hypertension. Narrative CT CHEST WO CONTRAST, 12/06/2023 8:41 AM INDICATION:Lung nodule, > 8mm, Solitary pulmonary nodule \ R91.1 Solitary pulmonary nodule ADDITIONAL HISTORY: None. COMPARISON: Calcium score dated 08/16/2023 TECHNIQUE: Multislice axial images were obtained through the chest without administration of iodinated intravenous contrast material. Multi-planar reformatted images were generated for additional analysis. Nongated technique limits cardiac detail. All CT scans at [facility] and [facility] are performed using dose optimization techniques as appropriate to a performed exam, including but not limited to one or more of the following: automated exposure control, adjustment of the mA  and/or kV according to patient size, use of iterative reconstruction technique. In addition, [facility] is participating in the ACR Dose Registry program which will further assist us  in optimizing patient radiation exposure. FINDINGS: Thoracic inlet/central airways: Thyroid  normal. Airway patent. No cervical adenopathy. Mediastinum/hila/axilla: Borderline enlarged left axillary lymph node measures 1.1 cm. Otherwise no intrathoracic or right axillary adenopathy. A small hiatal hernia noted. Heart/vessels: Normal heart size. No pericardial effusion. There is mild coronary artery calcification. Aorta is tortuous. Minimal atherosclerotic disease of abdominal aorta noted. Fusiform dilatation of ascending thoracic aorta measures 4.1 x 4.1 cm. Main pulmonary tree is dilated measuring approximately 3.6 cm. This can be seen in pulmonary hypertension.  Lungs/pleura: Previously seen spiculated opacity, groundglass opacity and pulmonary nodules in both lungs have remained grossly unchanged (right lung: 59, 73, 83 series 2, left lung: 116, 126 series 2). There is a new lobulated solid nodule in the right middle lobe measuring approximately 1 x 1.3 cm (28 series 2). No pulmonary parenchymal disease, pleural effusion, or pneumothorax.  Upper abdomen: Calcific density anterior to the liver is likely a sequelae of epiploic appendagitis. No acute findings in the upper abdomen.  Chest wall/MSK: Relatively dense fibroglandular tissue both breasts noted. Punctate calcification seen in the right breast. Please correlate with an annual mammogram. Degenerative changes of the cervical spine, degenerative disc disease noted. No acute osseous findings.    ROS    Objective:     BP 129/86   Pulse 86   Ht 5\' 8"  (1.727 m)   Wt 219 lb (99.3 kg)   LMP  (LMP Unknown) Comment: hystrectomy  SpO2 97%   BMI 33.30 kg/m    Physical  Exam Vitals reviewed.  Constitutional:      Appearance: Normal appearance.  HENT:     Head:  Normocephalic.  Pulmonary:     Effort: Pulmonary effort is normal.  Neurological:     Mental Status: She is alert and oriented to person, place, and time.  Psychiatric:        Mood and Affect: Mood normal.        Behavior: Behavior normal.      No results found for any visits on 12/09/23.    The 10-year ASCVD risk score (Arnett DK, et al., 2019) is: 6.1%    Assessment & Plan:   Problem List Items Addressed This Visit       Cardiovascular and Mediastinum   HYPERTENSION, BENIGN ESSENTIAL   Bp looks great!!! Currently on 25mg  hydrochlorothiazide . At home BPS running typically in the 130s.  Discussed restarting half a tab of losartan  or whole tab if needed to get BPs consistantly under 130 espec w/ regard to findings on CT.        Relevant Medications   hydrochlorothiazide  (HYDRODIURIL ) 25 MG tablet   losartan  (COZAAR ) 25 MG tablet   Other Relevant Orders   Ambulatory referral to Cardiology   Dilatation of pulmonic artery (HCC)   Dilated main pulmonary tree measuring up to 3.6 cm. This can be seen in pulmonary hypertension.  NO Significant SOB.  Will refer to Cards for further workup.  May need echo, etc.       Relevant Medications   hydrochlorothiazide  (HYDRODIURIL ) 25 MG tablet   losartan  (COZAAR ) 25 MG tablet   Aortic dilatation (HCC)   Will refer to Cardiology. Will likely need echo for further workup.  May be a overread on the CT.  Echo may be helpful to evaluate valves .  BP at goal today.  Maintain systolic  BP under 130      Relevant Medications   hydrochlorothiazide  (HYDRODIURIL ) 25 MG tablet   losartan  (COZAAR ) 25 MG tablet     Respiratory   Right middle lobe pulmonary nodule     "New lobulated solid nodule in the right middle lobe measuring approximately 1 x 1.3 cm. This could be due to infectious/inflammatory process." - has a dry cough but not other indication of infection.    Will need repeat CT in 3 mo this is new from lesion right middle lobe seen  previously.       Nodule of upper lobe of right lung   Stable on CT x 3 months.       Hiatal hernia - Primary   No active GERD sxs.          Musculoskeletal and Integument   Seronegative rheumatoid arthritis (HCC)   Getting ready to change RA meds bc Rinvoq  no longer effective.  Feels taking her diuretic helps with general swelling and keeps joints more comfortable.       Other Visit Diagnoses       Lymphadenopathy, axillary       Relevant Orders   MS US  BREAST LTD UNI LEFT INC AXILLA     Breast calcification, right       Relevant Orders   MM Digital Diagnostic Unilat R     Dry cough          Will get additional imaging of breast to f/u on punctate lesion and swollen left axillary LN.   No follow-ups on file.    Duaine German, MD

## 2023-12-09 NOTE — Assessment & Plan Note (Signed)
 Will refer to Cardiology. Will likely need echo for further workup.  May be a overread on the CT.  Echo may be helpful to evaluate valves .  BP at goal today.  Maintain systolic  BP under 130

## 2023-12-09 NOTE — Assessment & Plan Note (Signed)
 Bp looks great!!! Currently on 25mg  hydrochlorothiazide . At home BPS running typically in the 130s.  Discussed restarting half a tab of losartan  or whole tab if needed to get BPs consistantly under 130 espec w/ regard to findings on CT.

## 2023-12-09 NOTE — Assessment & Plan Note (Addendum)
 Dilated main pulmonary tree measuring up to 3.6 cm. This can be seen in pulmonary hypertension.  NO Significant SOB.  Will refer to Cards for further workup.  May need echo, etc.

## 2023-12-09 NOTE — Assessment & Plan Note (Addendum)
 Stable on CT x 3 months.

## 2023-12-09 NOTE — Assessment & Plan Note (Signed)
 Getting ready to change RA meds bc Rinvoq  no longer effective.  Feels taking her diuretic helps with general swelling and keeps joints more comfortable.

## 2023-12-09 NOTE — Assessment & Plan Note (Signed)
 No active GERD sxs.

## 2023-12-10 ENCOUNTER — Other Ambulatory Visit: Payer: Self-pay

## 2023-12-10 ENCOUNTER — Encounter (HOSPITAL_COMMUNITY): Payer: Self-pay

## 2023-12-10 NOTE — Progress Notes (Addendum)
 Pharmacy Patient Advocate Encounter   Received notification from Patient Pharmacy that prior authorization for Cimzia  is required/requested.   Insurance verification completed.   The patient is insured through [employer] .   Per test claim: PA required; PA submitted to above mentioned insurance via CoverMyMeds Key/confirmation #/EOC [id number] Status is pending

## 2023-12-13 ENCOUNTER — Other Ambulatory Visit: Payer: Self-pay

## 2023-12-13 ENCOUNTER — Other Ambulatory Visit (HOSPITAL_COMMUNITY): Payer: Self-pay

## 2023-12-13 NOTE — Progress Notes (Signed)
 Specialty Pharmacy Initial Fill Coordination Note  Kathy Clark is a 63 y.o. female contacted today regarding initial fill of specialty medication(s) Certolizumab Pegol  (Cimzia -Starter)   Patient requested Delivery   Delivery date: 12/15/23   Verified address: [address]   Medication will be filled on 6/3.   Patient is aware of $0 copayment.

## 2023-12-13 NOTE — Progress Notes (Signed)
 Pharmacy Patient Advocate Encounter  Received notification from [employer] that Prior Authorization for Cimzia  has been APPROVED from 11/10/23 to 06/07/24   PA #/Case ID/Reference #: [id number]

## 2023-12-13 NOTE — Progress Notes (Signed)
 Pharmacy Patient Advocate Encounter  Insurance verification completed.   The patient is insured through [employer] test claim for Cimzia . Co-pay is $0. Patient has copay card.  This test claim was processed through [facility]- copay amounts may vary at other pharmacies due to pharmacy/plan contracts, or as the patient moves through the different stages of their insurance plan.

## 2023-12-13 NOTE — Progress Notes (Signed)
 Specialty Pharmacy Initiation Note   Kathy Clark is a 63 y.o. female who will be followed by the specialty pharmacy service for RxSp Rheumatoid Arthritis    Review of administration, indication, effectiveness, safety, potential side effects, storage/disposable, and missed dose instructions occurred today for patient's specialty medication(s) Certolizumab Pegol  (Cimzia -Starter)     Patient/Caregiver did not have any additional questions or concerns.   Patient's therapy is appropriate to: Initiate    Goals Addressed             This Visit's Progress    Reduce signs and symptoms       Patient is initiating therapy with Cimzia . Patient will maintain adherence         Rena Carnes Specialty Pharmacist

## 2023-12-14 ENCOUNTER — Other Ambulatory Visit: Payer: Self-pay

## 2023-12-20 ENCOUNTER — Other Ambulatory Visit: Payer: Self-pay | Admitting: *Deleted

## 2023-12-20 DIAGNOSIS — R590 Localized enlarged lymph nodes: Secondary | ICD-10-CM

## 2023-12-20 DIAGNOSIS — Z1231 Encounter for screening mammogram for malignant neoplasm of breast: Secondary | ICD-10-CM

## 2023-12-21 ENCOUNTER — Encounter: Payer: Self-pay | Admitting: Family Medicine

## 2023-12-30 ENCOUNTER — Other Ambulatory Visit: Payer: Self-pay | Admitting: Orthopedic Surgery

## 2023-12-30 DIAGNOSIS — M25561 Pain in right knee: Secondary | ICD-10-CM

## 2023-12-31 ENCOUNTER — Other Ambulatory Visit: Payer: Self-pay

## 2024-01-02 ENCOUNTER — Encounter (INDEPENDENT_AMBULATORY_CARE_PROVIDER_SITE_OTHER): Payer: Self-pay

## 2024-01-02 ENCOUNTER — Ambulatory Visit

## 2024-01-02 DIAGNOSIS — M25561 Pain in right knee: Secondary | ICD-10-CM | POA: Diagnosis not present

## 2024-01-03 ENCOUNTER — Other Ambulatory Visit: Payer: Self-pay

## 2024-01-10 ENCOUNTER — Other Ambulatory Visit: Payer: Self-pay

## 2024-01-10 MED ORDER — CIMZIA (2 SYRINGE) 200 MG/ML ~~LOC~~ PSKT
PREFILLED_SYRINGE | SUBCUTANEOUS | 5 refills | Status: DC
  Filled 2024-01-25: qty 2, 28d supply, fill #0
  Filled 2024-02-11 – 2024-02-14 (×2): qty 2, 28d supply, fill #1
  Filled 2024-03-16: qty 2, 28d supply, fill #2

## 2024-01-11 ENCOUNTER — Other Ambulatory Visit: Payer: Self-pay | Admitting: Family Medicine

## 2024-01-11 DIAGNOSIS — G5603 Carpal tunnel syndrome, bilateral upper limbs: Secondary | ICD-10-CM

## 2024-01-11 DIAGNOSIS — I493 Ventricular premature depolarization: Secondary | ICD-10-CM

## 2024-01-11 MED ORDER — GABAPENTIN 300 MG PO CAPS
ORAL_CAPSULE | ORAL | 3 refills | Status: AC
  Filled 2024-01-11: qty 270, 90d supply, fill #0
  Filled 2024-04-15: qty 270, 90d supply, fill #1
  Filled 2024-07-21: qty 270, 90d supply, fill #2

## 2024-01-11 MED ORDER — VERAPAMIL HCL ER 120 MG PO TBCR
120.0000 mg | EXTENDED_RELEASE_TABLET | Freq: Every day | ORAL | 0 refills | Status: DC
Start: 2024-01-11 — End: 2024-02-17
  Filled 2024-01-11: qty 90, 90d supply, fill #0

## 2024-01-12 ENCOUNTER — Other Ambulatory Visit: Payer: Self-pay

## 2024-01-12 ENCOUNTER — Other Ambulatory Visit (HOSPITAL_COMMUNITY): Payer: Self-pay

## 2024-01-25 ENCOUNTER — Other Ambulatory Visit: Payer: Self-pay

## 2024-01-25 ENCOUNTER — Encounter (INDEPENDENT_AMBULATORY_CARE_PROVIDER_SITE_OTHER): Payer: Self-pay

## 2024-01-25 ENCOUNTER — Other Ambulatory Visit (HOSPITAL_COMMUNITY): Payer: Self-pay

## 2024-01-25 NOTE — Progress Notes (Signed)
 Specialty Pharmacy Refill Coordination Note  MyChart Questionnaire Submission  Kathy Clark is a 63 y.o. female contacted today regarding refills of specialty medication(s) Cimzia .  Injection date: (Patient-Rptd) 02/01/24    Patient requested: (Patient-Rptd) Delivery   Delivery date: 01/27/24   Verified address: (Patient-Rptd) [address] [place] [address]  Medication will be filled on 01/26/24.

## 2024-01-31 ENCOUNTER — Other Ambulatory Visit (HOSPITAL_BASED_OUTPATIENT_CLINIC_OR_DEPARTMENT_OTHER): Payer: Self-pay

## 2024-01-31 ENCOUNTER — Encounter: Payer: Self-pay | Admitting: Family Medicine

## 2024-01-31 ENCOUNTER — Other Ambulatory Visit (HOSPITAL_COMMUNITY): Payer: Self-pay

## 2024-01-31 ENCOUNTER — Other Ambulatory Visit: Payer: Self-pay

## 2024-01-31 DIAGNOSIS — I10 Essential (primary) hypertension: Secondary | ICD-10-CM

## 2024-01-31 DIAGNOSIS — R41840 Attention and concentration deficit: Secondary | ICD-10-CM

## 2024-01-31 DIAGNOSIS — R5383 Other fatigue: Secondary | ICD-10-CM

## 2024-01-31 NOTE — Telephone Encounter (Signed)
 The losartan  rx was written by a historical provider.   Vyvanse   Last OV: 12/09/23 Next OV: no appt scheduled Last RF: 10/19/23  Rx pended.

## 2024-02-01 ENCOUNTER — Encounter: Payer: Self-pay | Admitting: Cardiology

## 2024-02-01 ENCOUNTER — Other Ambulatory Visit: Payer: Self-pay

## 2024-02-01 ENCOUNTER — Ambulatory Visit: Attending: Cardiology | Admitting: Cardiology

## 2024-02-01 ENCOUNTER — Other Ambulatory Visit (HOSPITAL_COMMUNITY): Payer: Self-pay

## 2024-02-01 VITALS — BP 136/84 | Ht 68.0 in | Wt 216.0 lb

## 2024-02-01 DIAGNOSIS — Z79899 Other long term (current) drug therapy: Secondary | ICD-10-CM

## 2024-02-01 DIAGNOSIS — E7800 Pure hypercholesterolemia, unspecified: Secondary | ICD-10-CM | POA: Diagnosis not present

## 2024-02-01 DIAGNOSIS — R931 Abnormal findings on diagnostic imaging of heart and coronary circulation: Secondary | ICD-10-CM

## 2024-02-01 DIAGNOSIS — I493 Ventricular premature depolarization: Secondary | ICD-10-CM | POA: Diagnosis not present

## 2024-02-01 DIAGNOSIS — E782 Mixed hyperlipidemia: Secondary | ICD-10-CM | POA: Insufficient documentation

## 2024-02-01 DIAGNOSIS — I7121 Aneurysm of the ascending aorta, without rupture: Secondary | ICD-10-CM | POA: Insufficient documentation

## 2024-02-01 DIAGNOSIS — E66811 Obesity, class 1: Secondary | ICD-10-CM | POA: Insufficient documentation

## 2024-02-01 DIAGNOSIS — I2510 Atherosclerotic heart disease of native coronary artery without angina pectoris: Secondary | ICD-10-CM | POA: Insufficient documentation

## 2024-02-01 MED ORDER — LOSARTAN POTASSIUM 25 MG PO TABS
25.0000 mg | ORAL_TABLET | Freq: Every day | ORAL | 3 refills | Status: AC
Start: 2024-02-01 — End: ?
  Filled 2024-02-01 – 2024-02-14 (×2): qty 90, 90d supply, fill #0
  Filled 2024-06-26: qty 90, 90d supply, fill #1
  Filled 2024-07-27: qty 90, 90d supply, fill #2

## 2024-02-01 MED ORDER — ROSUVASTATIN CALCIUM 10 MG PO TABS: 10.0000 mg | ORAL_TABLET | Freq: Every day | ORAL | 3 refills | Status: DC

## 2024-02-01 MED ORDER — LISDEXAMFETAMINE DIMESYLATE 40 MG PO CAPS
40.0000 mg | ORAL_CAPSULE | Freq: Every day | ORAL | 0 refills | Status: DC
  Filled 2024-02-01 – 2024-02-14 (×2): qty 90, 90d supply, fill #0

## 2024-02-01 MED ORDER — ESTRADIOL 0.075 MG/24HR TD PTTW
1.0000 | MEDICATED_PATCH | TRANSDERMAL | 2 refills | Status: DC
  Filled 2024-02-01: qty 24, 84d supply, fill #0

## 2024-02-01 NOTE — Patient Instructions (Addendum)
 Medication Instructions:  Your physician has recommended you make the following change in your medication:  START: Crestor  10 mg nightly  *If you need a refill on your cardiac medications before your next appointment, please call your pharmacy*  Lab Work: TODAY: BMET, LFTs In 6 weeks: CMET, LFTs, Lipids If you have labs (blood work) drawn today and your tests are completely normal, you will receive your results only by: MyChart Message (if you have MyChart) OR A paper copy in the mail If you have any lab test that is abnormal or we need to change your treatment, we will call you to review the results.  Follow-Up: At [facility], you and your health needs are our priority.  As part of our continuing mission to provide you with exceptional heart care, our providers are all part of one team.  This team includes your primary Cardiologist (physician) and Advanced Practice Providers or APPs (Physician Assistants and Nurse Practitioners) who all work together to provide you with the care you need, when you need it.  Your next appointment:   9 month(s)   Other instructions: [email or web address] [email or web address]        FDA-cleared, clinical grade mobile EKG monitor: Crist is the most clinically-validated mobile EKG used by the world's leading cardiac care medical professionals With Basic service, know instantly if your heart rhythm is normal or if atrial fibrillation is detected, and email the last single EKG recording to yourself or your doctor Premium service, available for purchase through the Kardia app for $9.99 per month or $99 per year, includes unlimited history and storage of your EKG recordings, a monthly EKG summary report to share with your doctor, along with the ability to track your blood pressure, activity and weight Includes one KardiaMobile phone clip FREE SHIPPING: Standard delivery 1-3 business days. Orders placed by 11:00am PST will ship that  afternoon. Otherwise, will ship next business day. All orders ship via [employer] from [place], [place]    [employer] - sending an EKG Download app and set up profile. Run EKG - by placing 1-2 fingers on the silver  plates After EKG is complete - Download PDF  - Skip password (if you apply a password the provider will need it to view the EKG) Click share button (square with upward arrow) in bottom left corner To send: choose MyChart (first time log into MyChart)  Pop up window about sending ECG Click continue Choose type of message Choose provider Type subject and message Click send (EKG should be attached)  - To send additional EKGs in one message click the paperclip image and bottom of page to attach.    Provider:   Jennifer Crape, MD

## 2024-02-01 NOTE — Progress Notes (Addendum)
 Cardiology Office Note:    Date:  02/01/2024   ID:  Kathy Clark, DOB [birth year], MRN [id number]  PCP:  Alvan Dorothyann BIRCH, MD  Cardiologist:  Jennifer JONELLE Crape, MD   Referring MD: Alvan Dorothyann BIRCH, *    ASSESSMENT:    1. PVC (premature ventricular contraction)   2. Elevated cholesterol   3. Medication management   4. Elevated coronary artery calcium  score   5. Coronary artery calcification   6. Aneurysm of ascending aorta without rupture (HCC)   7. Mixed dyslipidemia   8. Obesity (BMI 30.0-34.9)    PLAN:    In order of problems listed above:  Coronary artery calcification: Patient is asymptomatic.  She denies any chest pain orthopnea or PND.  Secondary prevention stressed.  Importance of compliance with diet medication stressed and she vocalized understanding.  Once her knee is better I asked her to ambulate on a regular basis. Ascending aortic aneurysm: Stable.  Symptoms of dissection discussed.  Questions were answered to her satisfaction.   Mixed dyslipidemia: Lipids are markedly elevated.  In view of above I will do a Chem-7 and liver panel today.  She will have statin initiated today rosuvastatin  10 mg daily and liver lipid check in 6 weeks.  Diet emphasized. Essential hypertension: Blood pressure stable and diet was emphasized. Obesity: Weight reduction stressed and she promises to do better.  Risks of obesity explained. Palpitations: She gives history of sporadic palpitations which are very rare.  In view of this I discussed with her about cardiac monitoring.  She will do this and let us  know for any abnormalities. Patient will be seen in follow-up appointment in 6 months or earlier if the patient has any concerns.    Medication Adjustments/Labs and Tests Ordered: Current medicines are reviewed at length with the patient today.  Concerns regarding medicines are outlined above.  Orders Placed This Encounter  Procedures   Basic Metabolic Panel (BMET)   Hepatic  function panel   Comprehensive Metabolic Panel (CMET)   Hepatic function panel   Lipid panel   EKG 12-Lead   Meds ordered this encounter  Medications   rosuvastatin  (CRESTOR ) 10 MG tablet    Sig: Take 1 tablet (10 mg total) by mouth daily.    Dispense:  90 tablet    Refill:  3     History of Present Illness:    Kathy Clark is a 63 y.o. female who is being seen today for the evaluation of coronary artery calcification and ascending aortic aneurysm  At the request of Alvan Dorothyann BIRCH, *.  Patient is a pleasant 63 year old female.  She has past medical history of essential hypertension.  She is referred here because CT scan of her chest revealed ascending aortic aneurysm and coronary artery calcification.  She leads a sedentary lifestyle.  She does not have any chest pain orthopnea or PND.  She is sedentary because of orthopedic issues of her knee.  Before that she was very active without any symptoms.  At the time of my evaluation, the patient is alert awake oriented and in no distress.  Past Medical History:  Diagnosis Date   Anxiety    Back pain    Complication of anesthesia    allergy to Succinylcholine   Depression    Essential hypertension, benign    Gastritis    GERD (gastroesophageal reflux disease)    Joint pain    Lymphocytic colitis    microscopic   Migraines  Osteoarthritis    Sciatica    right leg   Swelling    feet, left foot    Past Surgical History:  Procedure Laterality Date   ABDOMINAL HYSTERECTOMY  07/2005   [place] Bunionectomy Left 09/18/2016   Left Foot   BACK SURGERY     BUNIONECTOMY Left 11/03/2022   HAMMER TOE SURGERY Left 11/03/2022   KNEE SURGERY  2006   left; multiple   microdiscetomy  2005   SHOULDER SURGERY  07/22/2009   right   SKIN CANCER EXCISION     eyelid   TOTAL ABDOMINAL HYSTERECTOMY     TOTAL KNEE ARTHROPLASTY  07/27/2011   Procedure: TOTAL KNEE ARTHROPLASTY;  Surgeon: Dempsey LULLA Moan;  Location: [facility];  Service:  Orthopedics;  Laterality: Left;    Current Medications: Current Meds  Medication Sig   AMBULATORY NON FORMULARY MEDICATION Medication Name: Please decrease CPAP setting to 8 cm of water pressure and fax us  a download after 5 days.  Is having a lot of difficulty with air leaks is working to try to reduce her pressure to see if she is still being adequately treated but with fewer leaks.FAx to [facility]   certolizumab pegol  (CIMZIA , 2 SYRINGE,) 200 MG/ML prefilled syringe Inject 1 mL (200 mg total) under the skin every 14 (fourteen) days.   EPINEPHrine  0.3 mg/0.3 mL IJ SOAJ injection Inject 0.3 mg into the muscle as needed for anaphylaxis.   estradiol  (VIVELLE -DOT) 0.075 MG/24HR Apply 1 patch to skin twice weekly as directed   estradiol  (VIVELLE -DOT) 0.075 MG/24HR Place 1 patch onto the skin 2 (two) times a week.   fluticasone  (FLONASE ) 50 MCG/ACT nasal spray Place 1 to 2 sprays into both nostrils daily.   gabapentin  (NEURONTIN ) 300 MG capsule Take 1 capsule (300 mg total) by mouth every morning AND 2 capsules (600 mg total) at bedtime.   hydrochlorothiazide  (HYDRODIURIL ) 25 MG tablet Take 25 mg by mouth daily.   lisdexamfetamine (VYVANSE ) 40 MG capsule Take 1 capsule (40 mg total) by mouth daily.   losartan  (COZAAR ) 25 MG tablet Take 25 mg by mouth daily.   methocarbamol  (ROBAXIN ) 500 MG tablet Take one tablet (500 mg dose) by mouth 3 (three) times a day as needed.   Multiple Vitamins-Minerals (MULTIVITAMIN ADULT PO) Take by mouth daily.   rosuvastatin  (CRESTOR ) 10 MG tablet Take 1 tablet (10 mg total) by mouth daily.   SUMAtriptan  (IMITREX ) 20 MG/ACT nasal spray PLACE 1 SPRAY INTO THE NOSE EVERY 2 HOURS AS NEEDED FOR HEADACHE   verapamil  (CALAN -SR) 120 MG CR tablet Take 1 tablet (120 mg total) by mouth at bedtime.     Allergies:   Succinylcholine, Dilaudid [hydromorphone hcl], Nsaids, Fluoxetine , Phentermine , Plaquenil [hydroxychloroquine], and Topamax  [topiramate ]   Social History    Socioeconomic History   Marital status: Married    Spouse name: Rusty   Number of children: 2   Years of education: Not on file   Highest education level: Bachelor's degree (e.g., BA, AB, BS)  Occupational History   Occupation: Teacher, adult education: [place]  Tobacco Use   Smoking status: Never    Passive exposure: Past   Smokeless tobacco: Never  Vaping Use   Vaping status: Never Used  Substance and Sexual Activity   Alcohol use: Yes    Comment: occasionally   Drug use: No   Sexual activity: Yes    Birth control/protection: Surgical    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  Other Topics Concern   Not on file  Social History Narrative   Not on file   Social Drivers of Health   Financial Resource Strain: Low Risk  (09/13/2023)   Overall Financial Resource Strain (CARDIA)    Difficulty of Paying Living Expenses: Not very hard  Food Insecurity: No Food Insecurity (09/13/2023)   Hunger Vital Sign    Worried About Running Out of Food in the Last Year: Never true    Ran Out of Food in the Last Year: Never true  Transportation Needs: No Transportation Needs (09/13/2023)   PRAPARE - Administrator, Civil Service (Medical): No    Lack of Transportation (Non-Medical): No  Physical Activity: Sufficiently Active (09/13/2023)   Exercise Vital Sign    Days of Exercise per Week: 5 days    Minutes of Exercise per Session: 30 min  Stress: Stress Concern Present (09/13/2023)   [employer] of Occupational Health - Occupational Stress Questionnaire    Feeling of Stress : To some extent  Social Connections: Socially Integrated (09/13/2023)   Social Connection and Isolation Panel    Frequency of Communication with Friends and Family: More than three times a week    Frequency of Social Gatherings with Friends and Family: Once a week    Attends Religious Services: More than 4 times per year    Active Member of [employer] or Organizations: Yes    Attends Museum/gallery exhibitions officer: More than 4 times per year    Marital Status: Married     Family History: The patient's family history includes Anxiety disorder in her mother; Atrial fibrillation in her brother; Breast cancer in her mother and another family member; Depression in her mother; Heart disease in her father; Hyperlipidemia in her father; Hypertension in her brother and father; Stroke in her paternal grandmother; Tongue cancer in her mother. There is no history of Colon cancer.  ROS:   Please see the history of present illness.    All other systems reviewed and are negative.  EKGs/Labs/Other Studies Reviewed:    The following studies were reviewed today: EKG reveals sinus rhythm and nonspecific ST-T changes   Recent Labs: 02/16/2023: Hemoglobin 13.8; Platelets 311 03/12/2023: ALT 24; BUN 17; Creat 0.64; Potassium 4.0; Sodium 136  Recent Lipid Panel    Component Value Date/Time   CHOL 219 (H) 09/11/2022 0852   CHOL 201 (H) 11/08/2019 0806   TRIG 107 09/11/2022 0852   HDL 63 09/11/2022 0852   HDL 51 11/08/2019 0806   CHOLHDL 3.5 09/11/2022 0852   VLDL 21 04/05/2015 0937   LDLCALC 135 (H) 09/11/2022 0852    Physical Exam:    VS:  BP 136/84 (BP Location: Right Arm, Patient Position: Sitting, Cuff Size: Normal)   Ht 5' 8 (1.727 m)   Wt 216 lb 0.6 oz (98 kg)   LMP  (LMP Unknown) Comment: hystrectomy  SpO2 97%   BMI 32.85 kg/m     Wt Readings from Last 3 Encounters:  02/01/24 216 lb 0.6 oz (98 kg)  12/09/23 219 lb (99.3 kg)  11/02/23 218 lb (98.9 kg)     GEN: Patient is in no acute distress HEENT: Normal NECK: No JVD; No carotid bruits LYMPHATICS: No lymphadenopathy CARDIAC: S1 S2 regular, 2/6 systolic murmur at the apex. RESPIRATORY:  Clear to auscultation without rales, wheezing or rhonchi  ABDOMEN: Soft, non-tender, non-distended MUSCULOSKELETAL:  No edema; No deformity  SKIN: Warm and dry NEUROLOGIC:  Alert and oriented x 3  PSYCHIATRIC:  Normal affect     Signed, Jennifer JONELLE Crape, MD  02/01/2024 9:32 AM    [facility]

## 2024-02-02 ENCOUNTER — Other Ambulatory Visit (HOSPITAL_COMMUNITY): Payer: Self-pay

## 2024-02-02 ENCOUNTER — Ambulatory Visit: Payer: Self-pay | Admitting: Cardiology

## 2024-02-02 ENCOUNTER — Other Ambulatory Visit: Payer: Self-pay

## 2024-02-02 LAB — HEPATIC FUNCTION PANEL
ALT: 21 IU/L (ref 0–32)
AST: 17 IU/L (ref 0–40)
Albumin: 4.3 g/dL (ref 3.9–4.9)
Alkaline Phosphatase: 37 IU/L — ABNORMAL LOW (ref 44–121)
Bilirubin Total: 0.3 mg/dL (ref 0.0–1.2)
Bilirubin, Direct: 0.12 mg/dL (ref 0.00–0.40)
Total Protein: 6.7 g/dL (ref 6.0–8.5)

## 2024-02-02 LAB — BASIC METABOLIC PANEL WITH GFR
BUN/Creatinine Ratio: 24 (ref 12–28)
BUN: 16 mg/dL (ref 8–27)
CO2: 23 mmol/L (ref 20–29)
Calcium: 9.5 mg/dL (ref 8.7–10.3)
Chloride: 101 mmol/L (ref 96–106)
Creatinine, Ser: 0.66 mg/dL (ref 0.57–1.00)
Glucose: 86 mg/dL (ref 70–99)
Potassium: 4.5 mmol/L (ref 3.5–5.2)
Sodium: 141 mmol/L (ref 134–144)
eGFR: 99 mL/min/1.73 (ref >59)

## 2024-02-03 ENCOUNTER — Other Ambulatory Visit (HOSPITAL_COMMUNITY): Payer: Self-pay

## 2024-02-11 ENCOUNTER — Other Ambulatory Visit: Payer: Self-pay

## 2024-02-14 ENCOUNTER — Encounter: Payer: Self-pay | Admitting: Cardiology

## 2024-02-14 ENCOUNTER — Other Ambulatory Visit: Payer: Self-pay

## 2024-02-14 ENCOUNTER — Other Ambulatory Visit (HOSPITAL_COMMUNITY): Payer: Self-pay

## 2024-02-14 NOTE — Progress Notes (Signed)
 Specialty Pharmacy Refill Coordination Note  Kathy Clark is a 63 y.o. female contacted today regarding refills of specialty medication(s) Certolizumab Pegol  (Cimzia  (2 Syringe))   Patient requested Delivery   Delivery date: 02/24/24   Verified address: [address] [place] [address]   Medication will be filled on 08.13.25.

## 2024-02-15 ENCOUNTER — Other Ambulatory Visit (HOSPITAL_COMMUNITY): Payer: Self-pay

## 2024-02-15 ENCOUNTER — Other Ambulatory Visit: Payer: Self-pay

## 2024-02-15 NOTE — Progress Notes (Signed)
 Clinical Intervention Note  Clinical Intervention Notes: Patient called regarding a refill for Cimzia , but it was too early per insurance. Upon review, it was determined that she mistakenly injected two syringes (400 mg total) at her last dose, instead of initiating her maintenance dose of 200 mg every 14 days. This error occurred as she had been using 400 mg for the loading phase and continued the same dosing. We discussed that since she received 400 mg, she should wait 4 weeks before resuming the correct maintenance schedule of 200 mg every 14 days. Her provider prefers this dosing schedule, as patients reportedly experience better outcomes. I advised her to use a calendar to track her injections and document the correct dates and dosage (1 syringe). We reviewed the next two appropriate dosing dates, and she was understanding of the plan.   Clinical Intervention Outcomes: Improved therapy adherence; Improved therapy effectiveness   Silvano LOISE Dolly Specialty Pharmacist

## 2024-02-16 ENCOUNTER — Other Ambulatory Visit: Payer: Self-pay | Admitting: Family Medicine

## 2024-02-16 MED ORDER — PRAVASTATIN SODIUM 20 MG PO TABS: 20.0000 mg | ORAL_TABLET | Freq: Every evening | ORAL | 3 refills | Status: AC

## 2024-02-17 ENCOUNTER — Other Ambulatory Visit (HOSPITAL_COMMUNITY): Payer: Self-pay

## 2024-02-17 ENCOUNTER — Ambulatory Visit: Admitting: Family Medicine

## 2024-02-17 ENCOUNTER — Encounter: Payer: Self-pay | Admitting: Family Medicine

## 2024-02-17 VITALS — BP 122/70 | HR 77 | Ht 68.0 in | Wt 210.0 lb

## 2024-02-17 DIAGNOSIS — R197 Diarrhea, unspecified: Secondary | ICD-10-CM | POA: Diagnosis not present

## 2024-02-17 DIAGNOSIS — I10 Essential (primary) hypertension: Secondary | ICD-10-CM

## 2024-02-17 DIAGNOSIS — I493 Ventricular premature depolarization: Secondary | ICD-10-CM

## 2024-02-17 DIAGNOSIS — L989 Disorder of the skin and subcutaneous tissue, unspecified: Secondary | ICD-10-CM

## 2024-02-17 DIAGNOSIS — I2510 Atherosclerotic heart disease of native coronary artery without angina pectoris: Secondary | ICD-10-CM

## 2024-02-17 MED ORDER — VERAPAMIL HCL ER 180 MG PO TBCR
180.0000 mg | EXTENDED_RELEASE_TABLET | Freq: Every day | ORAL | 0 refills | Status: DC
  Filled 2024-02-17: qty 90, 90d supply, fill #0

## 2024-02-17 MED ORDER — HYOSCYAMINE SULFATE ER 0.375 MG PO TB12: 0.3750 mg | ORAL_TABLET | Freq: Two times a day (BID) | ORAL | 1 refills | Status: AC

## 2024-02-17 NOTE — Progress Notes (Signed)
 Established Patient Office Visit  Subjective  Patient ID: Kathy Clark, female    DOB: [birth year]  Age: 63 y.o. MRN: [id number]  Chief Complaint  Patient presents with   Medical Management of Chronic Issues    HPI She unfortunately has been having some frequent diarrhea.  She had been on a couple rounds of prednisone  earlier this summer and more recently Had been taking NSAIDs for a torn meniscus but now feels like she has heartburn but in her gut. She would like a rx for levbid .  She did have a diagnosis of lymphocytic colitis about 25 years ago and says this feels very similar.  She is on a keto diet.  She is lost over 40 pounds at this point is doing really well.  She sticking to lean proteins and vegetables and low-carb.  Cards  stopped the Crestor  to see if that was causing some of the diarrhea she was experiencing she was having multiple watery loose stools.  It would come and go it might improve for a day or 2 and then come right back.  I sent over a new prescription for pravastatin  for her to start once the diarrhea stops.  She has a spot on her nose.  She says it has been there for couple of weeks she noticed it when she was try to apply sunscreen it just feels like a little dry crusty spot.  Has not healed or gone away but is not painful or bothersome.    ROS    Objective:     BP 122/70   Pulse 77   Ht 5' 8 (1.727 m)   Wt 210 lb (95.3 kg)   LMP  (LMP Unknown) Comment: hystrectomy  SpO2 98%   BMI 31.93 kg/m     Physical Exam Vitals reviewed.  Constitutional:      Appearance: Normal appearance.  HENT:     Head: Normocephalic.  Pulmonary:     Effort: Pulmonary effort is normal.  Skin:    Comments: Hyperkeratotic lesion on the nose.    Neurological:     Mental Status: She is alert and oriented to person, place, and time.  Psychiatric:        Mood and Affect: Mood normal.        Behavior: Behavior normal.      No results found for any visits on  02/17/24.     The 10-year ASCVD risk score (Arnett DK, et al., 2019) is: 5.5%    Assessment & Plan:   Problem List Items Addressed This Visit       Cardiovascular and Mediastinum   PVC (premature ventricular contraction)   Inc verapamil  to 180 mg daily to see if can get better control of the PVCs.  We did discuss monitoring blood pressure carefully just to make sure that it is not going too low we can always do this continue the losartan  or either cut it in half.  She would like to keep the diuretic if possible.      Relevant Medications   verapamil  (CALAN -SR) 180 MG CR tablet   Other Relevant Orders   Ambulatory referral to Cardiology   HYPERTENSION, BENIGN ESSENTIAL   BP at goal.       Relevant Medications   verapamil  (CALAN -SR) 180 MG CR tablet   Coronary artery calcification   She has had some diarrhea since starting Crestor  she is gena stop it completely and hold it to see if the diarrhea resolves on its  own hopefully will improve after 1 to 2 weeks.  If it resolves then she is can start pravastatin .  New prescription was sent to the pharmacy.  She would also like to find a cardiologist closer to home she lives in [place] town      Relevant Medications   verapamil  (CALAN -SR) 180 MG CR tablet   Other Relevant Orders   Ambulatory referral to Cardiology   Other Visit Diagnoses       Non-healing skin lesion of nose    -  Primary     Diarrhea, unspecified type          Diarrhea-will also send over Levbid  that worked well for her in the past when she was experiencing some lymphocytic colitis.  Skin lesion is most consistent with actinic keratosis versus early squamous.  Recommend cryotherapy if lesion returns will refer to dermatology for excision.  Cryotherapy Procedure Note  Pre-operative Diagnosis: Suspicious lesion, possible actinic keratosis  Post-operative Diagnosis: same  Locations: nose  Indications: precancerouslesion   Anesthesia: non  Procedure  Details   Patient informed of risks discoloration redness soreness.  And benefits of the procedure and verbal informed consent obtained.  The areas are treated with liquid nitrogen therapy, frozen until ice ball extended 1-2 mm beyond lesion, allowed to thaw, and treated again. The patient tolerated procedure well.  The patient was instructed on post-op care, warned that there may be blister formation, redness and pain. Recommend OTC analgesia as needed for pain.  Condition: Stable  Complications: none.  Plan: Return if not healing.  No special wound care needed just wash and face as normal and pat dry.  No follow-ups on file.    Dorothyann Byars, MD

## 2024-02-17 NOTE — Patient Instructions (Addendum)
 IF BPS going low on inc dose of the verapamil  then let me know we can always stop the losartan  and have you monitor blood pressures for 2 weeks or either cut it in half if need be.  I would probably give it at least a month on the increased dose of the verapamil  to see if it is helpful.

## 2024-02-17 NOTE — Assessment & Plan Note (Signed)
 Inc verapamil  to 180 mg daily to see if can get better control of the PVCs.  We did discuss monitoring blood pressure carefully just to make sure that it is not going too low we can always do this continue the losartan  or either cut it in half.  She would like to keep the diuretic if possible.

## 2024-02-17 NOTE — Assessment & Plan Note (Signed)
 BP at goal

## 2024-02-17 NOTE — Assessment & Plan Note (Signed)
 She has had some diarrhea since starting Crestor  she is gena stop it completely and hold it to see if the diarrhea resolves on its own hopefully will improve after 1 to 2 weeks.  If it resolves then she is can start pravastatin .  New prescription was sent to the pharmacy.  She would also like to find a cardiologist closer to home she lives in [place] town

## 2024-02-18 ENCOUNTER — Encounter: Payer: Self-pay | Admitting: Family Medicine

## 2024-02-21 ENCOUNTER — Other Ambulatory Visit (HOSPITAL_COMMUNITY): Payer: Self-pay

## 2024-02-21 MED ORDER — SUMATRIPTAN 20 MG/ACT NA SOLN
NASAL | 5 refills | Status: AC
  Filled 2024-02-21: qty 6, 30d supply, fill #0
  Filled 2024-06-26: qty 6, 30d supply, fill #1

## 2024-02-22 ENCOUNTER — Other Ambulatory Visit (HOSPITAL_COMMUNITY): Payer: Self-pay

## 2024-02-22 ENCOUNTER — Other Ambulatory Visit: Payer: Self-pay

## 2024-02-22 ENCOUNTER — Encounter: Payer: Self-pay | Admitting: Pharmacist

## 2024-02-23 ENCOUNTER — Other Ambulatory Visit: Payer: Self-pay

## 2024-03-06 ENCOUNTER — Encounter: Payer: Self-pay | Admitting: Family Medicine

## 2024-03-07 NOTE — Telephone Encounter (Signed)
 Called her and spoke to her about what is going on. Has bx schedule and f/u with nurse navigotor

## 2024-03-14 ENCOUNTER — Encounter: Payer: Self-pay | Admitting: Sports Medicine

## 2024-03-15 ENCOUNTER — Other Ambulatory Visit (HOSPITAL_COMMUNITY): Payer: Self-pay

## 2024-03-16 ENCOUNTER — Other Ambulatory Visit (HOSPITAL_COMMUNITY): Payer: Self-pay

## 2024-03-16 ENCOUNTER — Other Ambulatory Visit: Payer: Self-pay

## 2024-03-16 ENCOUNTER — Encounter: Payer: Self-pay | Admitting: Family Medicine

## 2024-03-16 NOTE — Progress Notes (Signed)
 Spoke with patient  Specialty Services Program has been paused, pending MD decision.  Patient was advised by MD to hold Cimzia , as she may have breast cancer and will be going for a biopsy. Patient advised to call Specialty Pharmacy if medication will be started again.

## 2024-03-17 ENCOUNTER — Other Ambulatory Visit: Payer: Self-pay

## 2024-03-17 ENCOUNTER — Encounter (INDEPENDENT_AMBULATORY_CARE_PROVIDER_SITE_OTHER): Payer: Self-pay

## 2024-03-17 MED ORDER — ALPRAZOLAM 0.5 MG PO TABS: 0.2500 mg | ORAL_TABLET | Freq: Two times a day (BID) | ORAL | 0 refills | Status: DC | PRN

## 2024-03-27 ENCOUNTER — Encounter: Payer: Self-pay | Admitting: Family Medicine

## 2024-03-28 ENCOUNTER — Other Ambulatory Visit (HOSPITAL_COMMUNITY): Payer: Self-pay

## 2024-03-28 MED ORDER — HYDROCHLOROTHIAZIDE 25 MG PO TABS
25.0000 mg | ORAL_TABLET | Freq: Every day | ORAL | 3 refills | Status: AC
  Filled 2024-03-28: qty 90, 90d supply, fill #0
  Filled 2024-06-26: qty 90, 90d supply, fill #1
  Filled 2024-07-27: qty 90, 90d supply, fill #2

## 2024-03-29 ENCOUNTER — Other Ambulatory Visit (HOSPITAL_COMMUNITY): Payer: Self-pay

## 2024-03-29 ENCOUNTER — Other Ambulatory Visit: Payer: Self-pay

## 2024-03-30 ENCOUNTER — Telehealth: Payer: Self-pay | Admitting: Family Medicine

## 2024-03-30 NOTE — Telephone Encounter (Unsigned)
 Copied from CRM [id number]. Topic: Clinical - Medication Refill >> Mar 30, 2024  3:40 PM Diannia H wrote: Medication: COVID-19 vaccine  Has the patient contacted their pharmacy? Yes (Agent: If no, request that the patient contact the pharmacy for the refill. If patient does not wish to contact the pharmacy document the reason why and proceed with request.) (Agent: If yes, when and what did the pharmacy advise?)  This is the patient's preferred pharmacy:  [facility] [address] - [place], [place] - [address] [place] [address] Phone: [phone] Fax: [phone]  Is this the correct pharmacy for this prescription? Yes If no, delete pharmacy and type the correct one.   Has the prescription been filled recently? No  Is the patient out of the medication? No  Has the patient been seen for an appointment in the last year OR does the patient have an upcoming appointment? Yes  Can we respond through MyChart? Yes  Agent: Please be advised that Rx refills may take up to 3 business days. We ask that you follow-up with your pharmacy.

## 2024-03-31 ENCOUNTER — Ambulatory Visit (INDEPENDENT_AMBULATORY_CARE_PROVIDER_SITE_OTHER)

## 2024-03-31 VITALS — BP 149/88 | HR 84 | Temp 98.1°F

## 2024-03-31 DIAGNOSIS — Z23 Encounter for immunization: Secondary | ICD-10-CM

## 2024-03-31 NOTE — Progress Notes (Signed)
 Patient is in office today for a nurse visit for COVID Immunization. Patient COVID Vaccine Injection was given in the  Left deltoid. Patient tolerated injection well.  Patient is scheduled to return on 2 weeks for her flu shot per her oncologist.

## 2024-04-13 ENCOUNTER — Other Ambulatory Visit: Payer: Self-pay

## 2024-04-13 NOTE — Progress Notes (Unsigned)
 Patient called about wanting to continue on cimzia . Rx on file discontinued and patient has been off of medication for a few weeks and is unsure if she is supposed to continue on maintenance dose or if she needs to restart on loading.

## 2024-04-14 ENCOUNTER — Other Ambulatory Visit: Payer: Self-pay

## 2024-04-14 ENCOUNTER — Other Ambulatory Visit (HOSPITAL_COMMUNITY): Payer: Self-pay

## 2024-04-14 MED ORDER — CIMZIA (2 SYRINGE) 200 MG/ML ~~LOC~~ PSKT
PREFILLED_SYRINGE | SUBCUTANEOUS | 5 refills | Status: AC
  Filled 2024-04-14: qty 2, 28d supply, fill #0
  Filled 2024-05-09: qty 2, 28d supply, fill #1
  Filled 2024-06-12 – 2024-06-13 (×2): qty 2, 28d supply, fill #2
  Filled 2024-07-10: qty 2, 28d supply, fill #3
  Filled 2024-08-08: qty 2, 28d supply, fill #4

## 2024-04-14 NOTE — Progress Notes (Signed)
 Clinical Intervention Note  Clinical Intervention Notes: Patient called inquiring about restarting Cimzia  and if she would need a loading dose again. Patient's last injection was 6 weeks ago. Told patient that she could resume maintanence dosing, but that we need a new prescription from her rheumatologist. Recommended that patient contact the office to discuss that her oncologist approved patient to go back on a biologic or methotrexate as neither would interfere with her breast cancer treatment and would outweigh any risks since her joint pain has resumed and now affecting her ability to sleep. Patient was understanding and stated she would call the rheumatologist.   Clinical Intervention Outcomes: Improved therapy adherence; Prevention of an adverse drug event   [facility] Specialty Pharmacist

## 2024-04-14 NOTE — Progress Notes (Signed)
 Specialty Pharmacy Refill Coordination Note  Kathy Clark is a 63 y.o. female contacted today regarding refills of specialty medication(s) Certolizumab Pegol  (Cimzia  (2 Syringe))   Patient requested Delivery   Delivery date: 04/18/24   Verified address: [address]  [name] [address]   Medication will be filled on 04/17/24.

## 2024-04-16 ENCOUNTER — Other Ambulatory Visit (HOSPITAL_COMMUNITY): Payer: Self-pay

## 2024-04-17 ENCOUNTER — Encounter: Payer: Self-pay | Admitting: Cardiology

## 2024-04-17 ENCOUNTER — Other Ambulatory Visit (HOSPITAL_COMMUNITY): Payer: Self-pay

## 2024-04-17 ENCOUNTER — Encounter: Payer: Self-pay | Admitting: Family Medicine

## 2024-04-19 ENCOUNTER — Ambulatory Visit (INDEPENDENT_AMBULATORY_CARE_PROVIDER_SITE_OTHER)

## 2024-04-19 VITALS — Temp 97.9°F

## 2024-04-19 DIAGNOSIS — Z23 Encounter for immunization: Secondary | ICD-10-CM | POA: Diagnosis not present

## 2024-04-24 ENCOUNTER — Ambulatory Visit

## 2024-05-01 ENCOUNTER — Other Ambulatory Visit: Payer: Self-pay

## 2024-05-01 ENCOUNTER — Other Ambulatory Visit (HOSPITAL_COMMUNITY): Payer: Self-pay

## 2024-05-01 MED ORDER — ANASTROZOLE 1 MG PO TABS
1.0000 mg | ORAL_TABLET | Freq: Every day | ORAL | 11 refills | Status: AC
  Filled 2024-05-01: qty 30, 30d supply, fill #0
  Filled 2024-05-23: qty 30, 30d supply, fill #1

## 2024-05-04 ENCOUNTER — Other Ambulatory Visit (HOSPITAL_COMMUNITY): Payer: Self-pay

## 2024-05-04 MED ORDER — ONDANSETRON HCL 4 MG PO TABS
4.0000 mg | ORAL_TABLET | Freq: Three times a day (TID) | ORAL | 0 refills | Status: AC | PRN
  Filled 2024-05-04: qty 20, 7d supply, fill #0

## 2024-05-09 ENCOUNTER — Other Ambulatory Visit: Payer: Self-pay

## 2024-05-09 ENCOUNTER — Other Ambulatory Visit: Payer: Self-pay | Admitting: Family Medicine

## 2024-05-09 DIAGNOSIS — R5383 Other fatigue: Secondary | ICD-10-CM

## 2024-05-09 DIAGNOSIS — R41840 Attention and concentration deficit: Secondary | ICD-10-CM

## 2024-05-09 NOTE — Progress Notes (Signed)
 Clinical Intervention Note  Clinical Intervention Notes: Patient reported starting anastrozole. No DDIs identified with Cimzia    Clinical Intervention Outcomes: Prevention of an adverse drug event   Advertising Account Planner

## 2024-05-09 NOTE — Progress Notes (Signed)
 Specialty Pharmacy Refill Coordination Note  Kathy Clark is a 63 y.o. female contacted today regarding refills of specialty medication(s) Certolizumab Pegol  (Cimzia  (2 Syringe))   Patient requested Delivery   Delivery date: 05/23/24   Verified address: [address] [place] [facility] [address]   Medication will be filled on: 05/22/24  Sent patient mychart message with new delivery date

## 2024-05-10 ENCOUNTER — Other Ambulatory Visit (HOSPITAL_COMMUNITY): Payer: Self-pay

## 2024-05-10 MED ORDER — LISDEXAMFETAMINE DIMESYLATE 40 MG PO CAPS
40.0000 mg | ORAL_CAPSULE | Freq: Every day | ORAL | 0 refills | Status: DC
  Filled 2024-05-10 – 2024-05-17 (×2): qty 90, 90d supply, fill #0

## 2024-05-11 ENCOUNTER — Other Ambulatory Visit (HOSPITAL_COMMUNITY): Payer: Self-pay

## 2024-05-15 ENCOUNTER — Other Ambulatory Visit: Payer: Self-pay | Admitting: Family Medicine

## 2024-05-17 ENCOUNTER — Other Ambulatory Visit (HOSPITAL_COMMUNITY): Payer: Self-pay

## 2024-05-22 ENCOUNTER — Other Ambulatory Visit: Payer: Self-pay

## 2024-05-23 ENCOUNTER — Other Ambulatory Visit (HOSPITAL_COMMUNITY): Payer: Self-pay

## 2024-05-23 ENCOUNTER — Other Ambulatory Visit: Payer: Self-pay | Admitting: Family Medicine

## 2024-05-24 ENCOUNTER — Other Ambulatory Visit: Payer: Self-pay

## 2024-05-24 ENCOUNTER — Other Ambulatory Visit (HOSPITAL_COMMUNITY): Payer: Self-pay

## 2024-05-24 MED ORDER — VERAPAMIL HCL ER 180 MG PO TBCR
180.0000 mg | EXTENDED_RELEASE_TABLET | Freq: Every day | ORAL | 0 refills | Status: DC
  Filled 2024-05-24: qty 90, 90d supply, fill #0

## 2024-05-25 ENCOUNTER — Other Ambulatory Visit (HOSPITAL_BASED_OUTPATIENT_CLINIC_OR_DEPARTMENT_OTHER): Payer: Self-pay

## 2024-05-25 ENCOUNTER — Other Ambulatory Visit (HOSPITAL_COMMUNITY): Payer: Self-pay

## 2024-05-25 ENCOUNTER — Encounter (HOSPITAL_COMMUNITY): Payer: Self-pay

## 2024-05-25 MED ORDER — SULFAMETHOXAZOLE-TRIMETHOPRIM 800-160 MG PO TABS
1.0000 | ORAL_TABLET | Freq: Two times a day (BID) | ORAL | 0 refills | Status: AC
  Filled 2024-05-25 (×2): qty 20, 10d supply, fill #0

## 2024-05-26 ENCOUNTER — Other Ambulatory Visit: Payer: Self-pay

## 2024-05-26 ENCOUNTER — Telehealth: Payer: Self-pay

## 2024-05-26 NOTE — Telephone Encounter (Signed)
 Pharmacy Patient Advocate Encounter   Received notification from Onbase that prior authorization for Cimzia  is required/requested.   Insurance verification completed.   The patient is insured through [employer].   Per test claim: PA required; PA submitted to above mentioned insurance via Latent Key/confirmation #/[id number] Status is pending

## 2024-05-26 NOTE — Telephone Encounter (Signed)
 Pharmacy Patient Advocate Encounter  Received notification from [employer] that Prior Authorization for Cimzia  has been APPROVED from 04/26/24 to 11/22/24   PA #/Case ID/Reference #: [id number]

## 2024-06-03 ENCOUNTER — Other Ambulatory Visit (HOSPITAL_COMMUNITY): Payer: Self-pay

## 2024-06-12 ENCOUNTER — Other Ambulatory Visit (HOSPITAL_COMMUNITY): Payer: Self-pay

## 2024-06-13 ENCOUNTER — Other Ambulatory Visit (HOSPITAL_COMMUNITY): Payer: Self-pay

## 2024-06-15 ENCOUNTER — Other Ambulatory Visit: Payer: Self-pay | Admitting: Pharmacy Technician

## 2024-06-15 ENCOUNTER — Other Ambulatory Visit: Payer: Self-pay

## 2024-06-15 NOTE — Progress Notes (Signed)
 Specialty Pharmacy Refill Coordination Note  Kathy Clark is a 63 y.o. female contacted today regarding refills of specialty medication(s) Certolizumab Pegol  (Cimzia  (2 Syringe))   Patient requested (Patient-Rptd) Delivery   Delivery date: 06/20/2024 Verified address: (Patient-Rptd) [address] [place] [address]   Medication will be filled on: 06/19/2024

## 2024-06-19 ENCOUNTER — Other Ambulatory Visit: Payer: Self-pay | Admitting: Family Medicine

## 2024-06-19 ENCOUNTER — Other Ambulatory Visit: Payer: Self-pay

## 2024-06-19 NOTE — Progress Notes (Signed)
 Clinical Intervention Note  Clinical Intervention Notes: Patient reported starting infusion treatments for breast cancer, including doxorubicin, cytoxan, Aloxi, Emend, and Neulasta injections. No DDIs identified with Cimzia    Clinical Intervention Outcomes: Prevention of an adverse drug event   Advertising Account Planner

## 2024-06-26 ENCOUNTER — Other Ambulatory Visit: Payer: Self-pay

## 2024-07-03 ENCOUNTER — Encounter: Payer: Self-pay | Admitting: Family Medicine

## 2024-07-03 MED ORDER — ALPRAZOLAM 0.5 MG PO TABS: 0.2500 mg | ORAL_TABLET | Freq: Two times a day (BID) | ORAL | 0 refills | Status: AC | PRN

## 2024-07-10 ENCOUNTER — Other Ambulatory Visit (HOSPITAL_COMMUNITY): Payer: Self-pay

## 2024-07-12 ENCOUNTER — Other Ambulatory Visit: Payer: Self-pay

## 2024-07-12 ENCOUNTER — Other Ambulatory Visit (HOSPITAL_COMMUNITY): Payer: Self-pay

## 2024-07-12 NOTE — Progress Notes (Signed)
 Specialty Pharmacy Refill Coordination Note  Kathy Clark is a 63 y.o. female contacted today regarding refills of specialty medication(s) Certolizumab Pegol  (Cimzia  (2 Syringe))   Patient requested Delivery   Delivery date: 07/19/24   Verified address: [address] [place] [facility] [address]   Medication will be filled on: 07/18/24

## 2024-07-18 ENCOUNTER — Other Ambulatory Visit: Payer: Self-pay

## 2024-07-19 ENCOUNTER — Other Ambulatory Visit (HOSPITAL_COMMUNITY): Payer: Self-pay

## 2024-07-19 ENCOUNTER — Other Ambulatory Visit (HOSPITAL_BASED_OUTPATIENT_CLINIC_OR_DEPARTMENT_OTHER): Payer: Self-pay

## 2024-07-19 MED ORDER — LIDOCAINE VISCOUS HCL 2 % MT SOLN
OROMUCOSAL | 1 refills | Status: AC
  Filled 2024-07-19: qty 120, 6d supply, fill #0
  Filled 2024-07-21: qty 120, 30d supply, fill #0

## 2024-07-21 ENCOUNTER — Other Ambulatory Visit (HOSPITAL_COMMUNITY): Payer: Self-pay

## 2024-07-21 ENCOUNTER — Other Ambulatory Visit (HOSPITAL_BASED_OUTPATIENT_CLINIC_OR_DEPARTMENT_OTHER): Payer: Self-pay

## 2024-07-21 ENCOUNTER — Other Ambulatory Visit: Payer: Self-pay

## 2024-07-27 ENCOUNTER — Other Ambulatory Visit: Payer: Self-pay | Admitting: Family Medicine

## 2024-07-27 ENCOUNTER — Other Ambulatory Visit: Payer: Self-pay

## 2024-07-27 DIAGNOSIS — R41840 Attention and concentration deficit: Secondary | ICD-10-CM

## 2024-07-27 DIAGNOSIS — R5383 Other fatigue: Secondary | ICD-10-CM

## 2024-07-28 ENCOUNTER — Other Ambulatory Visit (HOSPITAL_COMMUNITY): Payer: Self-pay

## 2024-07-28 ENCOUNTER — Encounter (HOSPITAL_COMMUNITY): Payer: Self-pay

## 2024-07-28 MED ORDER — VERAPAMIL HCL ER 180 MG PO TBCR
180.0000 mg | EXTENDED_RELEASE_TABLET | Freq: Every day | ORAL | 0 refills | Status: AC
  Filled 2024-07-28: qty 90, 90d supply, fill #0

## 2024-07-28 MED ORDER — LISDEXAMFETAMINE DIMESYLATE 40 MG PO CAPS
40.0000 mg | ORAL_CAPSULE | Freq: Every day | ORAL | 0 refills | Status: AC
  Filled 2024-07-28 – 2024-08-16 (×3): qty 90, 90d supply, fill #0
  Filled ????-??-??: fill #0

## 2024-07-30 ENCOUNTER — Other Ambulatory Visit (HOSPITAL_COMMUNITY): Payer: Self-pay

## 2024-07-30 ENCOUNTER — Telehealth: Admitting: Family

## 2024-07-30 DIAGNOSIS — K649 Unspecified hemorrhoids: Secondary | ICD-10-CM | POA: Diagnosis not present

## 2024-07-30 MED ORDER — HYDROCORTISONE (PERIANAL) 2.5 % EX CREA
1.0000 | TOPICAL_CREAM | Freq: Two times a day (BID) | CUTANEOUS | 0 refills | Status: DC
  Filled 2024-07-30: qty 30, 10d supply, fill #0

## 2024-07-30 MED ORDER — HYDROCORTISONE ACETATE 25 MG RE SUPP
25.0000 mg | Freq: Two times a day (BID) | RECTAL | 0 refills | Status: DC
  Filled 2024-07-30: qty 12, 6d supply, fill #0

## 2024-07-30 MED ORDER — HYDROCORTISONE ACETATE 25 MG RE SUPP: 25.0000 mg | Freq: Two times a day (BID) | RECTAL | 0 refills | Status: AC

## 2024-07-30 MED ORDER — HYDROCORTISONE (PERIANAL) 2.5 % EX CREA: 1.0000 | TOPICAL_CREAM | Freq: Two times a day (BID) | CUTANEOUS | 0 refills | Status: AC

## 2024-07-30 NOTE — Progress Notes (Signed)
 E-Visit for Hemorrhoid  We are sorry that you are not feeling well. We are here to help!  Hemorrhoids are swollen veins in the rectum. They can cause itching, bleeding, and pain. Hemorrhoids are very common.  In some cases, you can see or feel hemorrhoids around the outside of the rectum. In other cases, you cannot see them because they are hidden inside the rectum. Be patient - It can take months for this to improve or go away.  Hemorrhoids do not always cause symptoms. But when they do, symptoms can include: ?Itching of the skin around the anus. ?Bleeding - Bleeding is usually painless. You might see bright red blood after using the toilet. ?Pain - If a blood clot forms inside a hemorrhoid, this can cause pain. It can also cause a lump that you might be able to feel.  I have prescribed Topical Hydrocortisone  2.5%.  Apply to area twice daily for up to 30 days. Use for the shortest duration needed. and I have prescribed Anusol  HC suppositories.  Insert into rectum twice per day for 6 days.  HOME CARE: Sitz Baths twice daily. Soak buttocks in 2 or 3 inches of warm water for 10 to 15 minutes. Do not add soap, bubble bath, or anything to the water. Stool softener such as Colace 100 mg twice daily AND Miralax  1 scoop daily until you have regular soft stools. Over the counter Preparation H. Witch Hazel (Tuck's Pads contain this).  What can I do to keep from getting more hemorrhoids? -- The most important thing you can do is to keep from getting constipated. You should have a bowel movement at least a few times a week. When you have a bowel movement, you also should not have to push too much. Plus, your bowel movements should not be too hard. Being constipated and having hard bowel movements can make hemorrhoids worse.   Here are some steps you can take to avoid getting constipated or having hard stools:  ?Eat lots of fruits, vegetables, and other foods with fiber. Fiber helps to increase bowel  movements. If you do not get enough fiber from your diet, you can take fiber supplements. These come in the form of powders, wafers, or pills. Some examples are Metamucil, Citrucel, Benefiber and FiberCon. If you take a fiber supplement, be sure to read the label so you know how much to take. If you're not sure, ask your provider or nurse. ?Take medicines called stool softeners such as docusate sodium  (sample brand names: Colace, Dulcolax). These medicines increase the number of bowel movements you have. They are safe to take and they can prevent problems later.  If this is a recurring/relapsing condition or fails to improve, you need to follow-up with your primary care provider for a further evaluation. If you do not have a PCP, please be seen at local urgent care.  GET HELP RIGHT AWAY IF: You develop severe pain. You have heavy bleeding.   FOLLOW UP WITH YOUR PRIMARY PROVIDER IF: If your symptoms do not improve within 10 days.  MAKE SURE YOU  Read these instructions carefully. Seek an in-person evaluation for any non resolving, new or worsening symptoms despite treatment during this e-visit.   Thank you for choosing an e-visit.  Your e-visit answers were reviewed by a board certified advanced clinical practitioner to complete your personal care plan. Depending upon the condition, your plan could have included both over the counter or prescription medications.  Please review your pharmacy choice. Make  sure the pharmacy is open so you can pick up prescription now. If there is a problem, you may contact your provider through [employer] and have the prescription routed to another pharmacy.  Your safety is important to us . If you have drug allergies check your prescription carefully.   For the next 24 hours you can use MyChart to ask questions about today's visit, request a non-urgent call back, or ask for a work or school excuse. You will get an email in the next two days asking about  your experience. I hope that your e-visit has been valuable and will speed your recovery.  I have spent 5 minutes in review of e-visit questionnaire, review and updating patient chart, medical decision making and response to patient.   Bari Learn, FNP

## 2024-07-30 NOTE — Addendum Note (Signed)
 Addended by: LAVELL LYE A on: 07/30/2024 09:17 AM   Modules accepted: Orders

## 2024-07-31 ENCOUNTER — Other Ambulatory Visit: Payer: Self-pay

## 2024-07-31 ENCOUNTER — Other Ambulatory Visit (HOSPITAL_COMMUNITY): Payer: Self-pay

## 2024-08-08 ENCOUNTER — Other Ambulatory Visit: Payer: Self-pay

## 2024-08-09 ENCOUNTER — Other Ambulatory Visit: Payer: Self-pay

## 2024-08-09 NOTE — Progress Notes (Signed)
 Patient must use [facility]. Spoke with office-they will send new prescription to [facility]. Dis-enrolling.

## 2024-08-16 ENCOUNTER — Other Ambulatory Visit (HOSPITAL_COMMUNITY): Payer: Self-pay

## 2024-08-16 ENCOUNTER — Other Ambulatory Visit: Payer: Self-pay | Admitting: Family Medicine

## 2024-08-16 ENCOUNTER — Other Ambulatory Visit: Payer: Self-pay

## 2024-08-17 ENCOUNTER — Other Ambulatory Visit: Payer: Self-pay
